# Patient Record
Sex: Female | Born: 1945 | Race: White | Hispanic: No | Marital: Married | Smoking: Current every day smoker
Health system: Southern US, Community
[De-identification: ages and names within clinical notes are randomized; demographics above are authoritative.]

## PROBLEM LIST (undated history)

## (undated) DIAGNOSIS — R569 Unspecified convulsions: Secondary | ICD-10-CM

## (undated) DIAGNOSIS — T7840XA Allergy, unspecified, initial encounter: Secondary | ICD-10-CM

## (undated) DIAGNOSIS — K219 Gastro-esophageal reflux disease without esophagitis: Secondary | ICD-10-CM

## (undated) DIAGNOSIS — J439 Emphysema, unspecified: Secondary | ICD-10-CM

## (undated) DIAGNOSIS — F329 Major depressive disorder, single episode, unspecified: Secondary | ICD-10-CM

## (undated) DIAGNOSIS — F32A Depression, unspecified: Secondary | ICD-10-CM

## (undated) DIAGNOSIS — I2510 Atherosclerotic heart disease of native coronary artery without angina pectoris: Secondary | ICD-10-CM

## (undated) DIAGNOSIS — I639 Cerebral infarction, unspecified: Secondary | ICD-10-CM

## (undated) DIAGNOSIS — J449 Chronic obstructive pulmonary disease, unspecified: Secondary | ICD-10-CM

## (undated) DIAGNOSIS — E785 Hyperlipidemia, unspecified: Secondary | ICD-10-CM

## (undated) DIAGNOSIS — I10 Essential (primary) hypertension: Secondary | ICD-10-CM

## (undated) HISTORY — DX: Gastro-esophageal reflux disease without esophagitis: K21.9

## (undated) HISTORY — DX: Emphysema, unspecified: J43.9

## (undated) HISTORY — DX: Hyperlipidemia, unspecified: E78.5

## (undated) HISTORY — DX: Depression, unspecified: F32.A

## (undated) HISTORY — DX: Allergy, unspecified, initial encounter: T78.40XA

## (undated) HISTORY — DX: Major depressive disorder, single episode, unspecified: F32.9

---

## 2004-10-19 ENCOUNTER — Ambulatory Visit: Payer: Self-pay | Admitting: Internal Medicine

## 2007-06-26 ENCOUNTER — Other Ambulatory Visit: Payer: Self-pay

## 2007-06-26 ENCOUNTER — Emergency Department: Payer: Self-pay | Admitting: Emergency Medicine

## 2007-07-04 ENCOUNTER — Ambulatory Visit: Payer: Self-pay | Admitting: Internal Medicine

## 2007-07-12 ENCOUNTER — Ambulatory Visit: Payer: Self-pay | Admitting: Vascular Surgery

## 2008-11-12 ENCOUNTER — Emergency Department: Payer: Self-pay | Admitting: Unknown Physician Specialty

## 2011-03-21 ENCOUNTER — Emergency Department: Payer: Self-pay | Admitting: Emergency Medicine

## 2012-03-28 ENCOUNTER — Ambulatory Visit: Payer: Self-pay | Admitting: Gastroenterology

## 2012-03-30 LAB — PATHOLOGY REPORT

## 2013-05-14 ENCOUNTER — Inpatient Hospital Stay: Payer: Self-pay | Admitting: Internal Medicine

## 2013-05-14 LAB — URINALYSIS, COMPLETE
Bilirubin,UR: NEGATIVE
Blood: NEGATIVE
Glucose,UR: NEGATIVE mg/dL (ref 0–75)
Hyaline Cast: 6
Leukocyte Esterase: NEGATIVE
Nitrite: NEGATIVE
PH: 5 (ref 4.5–8.0)
Specific Gravity: 1.033 (ref 1.003–1.030)
Squamous Epithelial: <1

## 2013-05-14 LAB — CBC WITH DIFFERENTIAL/PLATELET
Basophil #: 0 10*3/uL (ref 0.0–0.1)
Basophil %: 0.1 %
EOS ABS: 0 10*3/uL (ref 0.0–0.7)
EOS PCT: 0 %
HCT: 35.2 % (ref 35.0–47.0)
HGB: 11.6 g/dL — AB (ref 12.0–16.0)
Lymphocyte #: 1.1 10*3/uL (ref 1.0–3.6)
Lymphocyte %: 5.2 %
MCH: 28.4 pg (ref 26.0–34.0)
MCHC: 33.1 g/dL (ref 32.0–36.0)
MCV: 86 fL (ref 80–100)
MONO ABS: 1.5 x10 3/mm — AB (ref 0.2–0.9)
Monocyte %: 7.4 %
NEUTROS PCT: 87.3 %
Neutrophil #: 17.9 10*3/uL — ABNORMAL HIGH (ref 1.4–6.5)
PLATELETS: 337 10*3/uL (ref 150–440)
RBC: 4.1 10*6/uL (ref 3.80–5.20)
RDW: 16 % — AB (ref 11.5–14.5)
WBC: 20.5 10*3/uL — ABNORMAL HIGH (ref 3.6–11.0)

## 2013-05-14 LAB — PRO B NATRIURETIC PEPTIDE: B-TYPE NATIURETIC PEPTID: 2432 pg/mL — AB (ref 0–125)

## 2013-05-14 LAB — BASIC METABOLIC PANEL
ANION GAP: 7 (ref 7–16)
BUN: 10 mg/dL (ref 7–18)
CALCIUM: 8.6 mg/dL (ref 8.5–10.1)
CHLORIDE: 100 mmol/L (ref 98–107)
CO2: 27 mmol/L (ref 21–32)
Creatinine: 0.82 mg/dL (ref 0.60–1.30)
EGFR (Non-African Amer.): >60
Glucose: 122 mg/dL — ABNORMAL HIGH (ref 65–99)
Osmolality: 269 (ref 275–301)
POTASSIUM: 3.1 mmol/L — AB (ref 3.5–5.1)
Sodium: 134 mmol/L — ABNORMAL LOW (ref 136–145)

## 2013-05-14 LAB — TROPONIN I: Troponin-I: <0.02 ng/mL

## 2013-05-14 LAB — PROTIME-INR
INR: 1.6
Prothrombin Time: 19 secs — ABNORMAL HIGH (ref 11.5–14.7)

## 2013-05-14 LAB — CK TOTAL AND CKMB (NOT AT ARMC)
CK, Total: 78 U/L (ref 21–215)
CK-MB: 0.9 ng/mL (ref 0.5–3.6)

## 2013-05-15 DIAGNOSIS — I517 Cardiomegaly: Secondary | ICD-10-CM

## 2013-05-15 LAB — CBC WITH DIFFERENTIAL/PLATELET
BASOS PCT: 0.1 %
Basophil #: 0 10*3/uL (ref 0.0–0.1)
EOS PCT: 0 %
Eosinophil #: 0 10*3/uL (ref 0.0–0.7)
HCT: 30.2 % — ABNORMAL LOW (ref 35.0–47.0)
HGB: 9.8 g/dL — ABNORMAL LOW (ref 12.0–16.0)
Lymphocyte #: 0.6 10*3/uL — ABNORMAL LOW (ref 1.0–3.6)
Lymphocyte %: 3.1 %
MCH: 27.9 pg (ref 26.0–34.0)
MCHC: 32.4 g/dL (ref 32.0–36.0)
MCV: 86 fL (ref 80–100)
MONOS PCT: 5.2 %
Monocyte #: 1 x10 3/mm — ABNORMAL HIGH (ref 0.2–0.9)
NEUTROS PCT: 91.6 %
Neutrophil #: 18.1 10*3/uL — ABNORMAL HIGH (ref 1.4–6.5)
PLATELETS: 252 10*3/uL (ref 150–440)
RBC: 3.51 10*6/uL — AB (ref 3.80–5.20)
RDW: 16 % — AB (ref 11.5–14.5)
WBC: 19.8 10*3/uL — ABNORMAL HIGH (ref 3.6–11.0)

## 2013-05-15 LAB — BASIC METABOLIC PANEL
Anion Gap: 5 — ABNORMAL LOW (ref 7–16)
BUN: 11 mg/dL (ref 7–18)
CHLORIDE: 103 mmol/L (ref 98–107)
CREATININE: 0.66 mg/dL (ref 0.60–1.30)
Calcium, Total: 8.4 mg/dL — ABNORMAL LOW (ref 8.5–10.1)
Co2: 23 mmol/L (ref 21–32)
EGFR (African American): >60
EGFR (Non-African Amer.): >60
Glucose: 106 mg/dL — ABNORMAL HIGH (ref 65–99)
OSMOLALITY: 262 (ref 275–301)
Potassium: 3.9 mmol/L (ref 3.5–5.1)
Sodium: 131 mmol/L — ABNORMAL LOW (ref 136–145)

## 2013-05-16 LAB — CBC WITH DIFFERENTIAL/PLATELET
BASOS PCT: 0.1 %
Basophil #: 0 10*3/uL (ref 0.0–0.1)
Eosinophil #: 0 10*3/uL (ref 0.0–0.7)
Eosinophil %: 0 %
HCT: 29.2 % — ABNORMAL LOW (ref 35.0–47.0)
HGB: 9.3 g/dL — ABNORMAL LOW (ref 12.0–16.0)
Lymphocyte #: 0.9 10*3/uL — ABNORMAL LOW (ref 1.0–3.6)
Lymphocyte %: 5.4 %
MCH: 27.3 pg (ref 26.0–34.0)
MCHC: 31.9 g/dL — AB (ref 32.0–36.0)
MCV: 85 fL (ref 80–100)
MONOS PCT: 7 %
Monocyte #: 1.2 x10 3/mm — ABNORMAL HIGH (ref 0.2–0.9)
Neutrophil #: 15.3 10*3/uL — ABNORMAL HIGH (ref 1.4–6.5)
Neutrophil %: 87.5 %
Platelet: 333 10*3/uL (ref 150–440)
RBC: 3.41 10*6/uL — ABNORMAL LOW (ref 3.80–5.20)
RDW: 15.8 % — ABNORMAL HIGH (ref 11.5–14.5)
WBC: 17.5 10*3/uL — ABNORMAL HIGH (ref 3.6–11.0)

## 2013-05-16 LAB — PROTIME-INR
INR: 2.5
Prothrombin Time: 26.4 secs — ABNORMAL HIGH (ref 11.5–14.7)

## 2013-05-16 LAB — CLOSTRIDIUM DIFFICILE(ARMC)

## 2013-05-16 LAB — URINE CULTURE

## 2013-05-17 LAB — BASIC METABOLIC PANEL WITH GFR
Anion Gap: 6 — ABNORMAL LOW
BUN: 7 mg/dL
Calcium, Total: 8.2 mg/dL — ABNORMAL LOW
Chloride: 105 mmol/L
Co2: 27 mmol/L
Creatinine: 0.57 mg/dL — ABNORMAL LOW
EGFR (African American): >60
EGFR (Non-African Amer.): >60
Glucose: 81 mg/dL
Osmolality: 273
Potassium: 4.1 mmol/L
Sodium: 138 mmol/L

## 2013-05-17 LAB — PROTIME-INR
INR: 2.2
Prothrombin Time: 23.6 s — ABNORMAL HIGH

## 2013-05-17 LAB — WBCS, STOOL

## 2013-05-18 LAB — CBC WITH DIFFERENTIAL/PLATELET
BASOS ABS: 0 10*3/uL (ref 0.0–0.1)
BASOS PCT: 0.1 %
EOS ABS: 0 10*3/uL (ref 0.0–0.7)
Eosinophil %: 0.2 %
HCT: 28.7 % — AB (ref 35.0–47.0)
HGB: 9.5 g/dL — AB (ref 12.0–16.0)
Lymphocyte #: 1 10*3/uL (ref 1.0–3.6)
Lymphocyte %: 7.1 %
MCH: 27.9 pg (ref 26.0–34.0)
MCHC: 33 g/dL (ref 32.0–36.0)
MCV: 85 fL (ref 80–100)
Monocyte #: 1.5 x10 3/mm — ABNORMAL HIGH (ref 0.2–0.9)
Monocyte %: 10.6 %
NEUTROS PCT: 82 %
Neutrophil #: 11.9 10*3/uL — ABNORMAL HIGH (ref 1.4–6.5)
Platelet: 347 10*3/uL (ref 150–440)
RBC: 3.39 10*6/uL — AB (ref 3.80–5.20)
RDW: 15.6 % — ABNORMAL HIGH (ref 11.5–14.5)
WBC: 14.5 10*3/uL — AB (ref 3.6–11.0)

## 2013-05-18 LAB — STOOL CULTURE

## 2013-05-18 LAB — PROTIME-INR
INR: 2
Prothrombin Time: 22.1 secs — ABNORMAL HIGH (ref 11.5–14.7)

## 2013-05-19 LAB — CBC WITH DIFFERENTIAL/PLATELET
BASOS ABS: 0 10*3/uL (ref 0.0–0.1)
Basophil %: 0.3 %
EOS PCT: 0 %
Eosinophil #: 0 10*3/uL (ref 0.0–0.7)
HCT: 28 % — AB (ref 35.0–47.0)
HGB: 9.1 g/dL — AB (ref 12.0–16.0)
Lymphocyte #: 0.8 10*3/uL — ABNORMAL LOW (ref 1.0–3.6)
Lymphocyte %: 5.4 %
MCH: 27.4 pg (ref 26.0–34.0)
MCHC: 32.5 g/dL (ref 32.0–36.0)
MCV: 84 fL (ref 80–100)
MONO ABS: 1.3 x10 3/mm — AB (ref 0.2–0.9)
Monocyte %: 8.3 %
NEUTROS ABS: 13.4 10*3/uL — AB (ref 1.4–6.5)
NEUTROS PCT: 86 %
Platelet: 339 10*3/uL (ref 150–440)
RBC: 3.32 10*6/uL — AB (ref 3.80–5.20)
RDW: 15.7 % — ABNORMAL HIGH (ref 11.5–14.5)
WBC: 15.6 10*3/uL — AB (ref 3.6–11.0)

## 2013-05-19 LAB — PROTIME-INR
INR: 2.1
Prothrombin Time: 23.4 secs — ABNORMAL HIGH (ref 11.5–14.7)

## 2013-05-19 LAB — CULTURE, BLOOD (SINGLE)

## 2013-07-26 ENCOUNTER — Inpatient Hospital Stay: Payer: Self-pay | Admitting: Internal Medicine

## 2013-07-26 LAB — COMPREHENSIVE METABOLIC PANEL
ALBUMIN: 3.7 g/dL (ref 3.4–5.0)
Alkaline Phosphatase: 74 U/L
Anion Gap: 6 — ABNORMAL LOW (ref 7–16)
BUN: 21 mg/dL — AB (ref 7–18)
Bilirubin,Total: 0.3 mg/dL (ref 0.2–1.0)
CALCIUM: 9 mg/dL (ref 8.5–10.1)
CREATININE: 0.86 mg/dL (ref 0.60–1.30)
Chloride: 106 mmol/L (ref 98–107)
Co2: 25 mmol/L (ref 21–32)
Glucose: 111 mg/dL — ABNORMAL HIGH (ref 65–99)
OSMOLALITY: 277 (ref 275–301)
Potassium: 4.3 mmol/L (ref 3.5–5.1)
SGOT(AST): 17 U/L (ref 15–37)
SGPT (ALT): 15 U/L (ref 12–78)
Sodium: 137 mmol/L (ref 136–145)
Total Protein: 7.8 g/dL (ref 6.4–8.2)

## 2013-07-26 LAB — CBC
HCT: 23.1 % — AB (ref 35.0–47.0)
HGB: 7 g/dL — AB (ref 12.0–16.0)
MCH: 22.5 pg — ABNORMAL LOW (ref 26.0–34.0)
MCHC: 30.3 g/dL — ABNORMAL LOW (ref 32.0–36.0)
MCV: 74 fL — ABNORMAL LOW (ref 80–100)
PLATELETS: 282 10*3/uL (ref 150–440)
RBC: 3.11 10*6/uL — ABNORMAL LOW (ref 3.80–5.20)
RDW: 20.1 % — AB (ref 11.5–14.5)
WBC: 9.4 10*3/uL (ref 3.6–11.0)

## 2013-07-26 LAB — TROPONIN I

## 2013-07-26 LAB — PROTIME-INR
INR: 1.7
Prothrombin Time: 19.8 secs — ABNORMAL HIGH (ref 11.5–14.7)

## 2013-07-27 LAB — CBC WITH DIFFERENTIAL/PLATELET
Basophil #: 0 10*3/uL (ref 0.0–0.1)
Basophil %: 0.4 %
EOS PCT: 0.4 %
Eosinophil #: 0 10*3/uL (ref 0.0–0.7)
HCT: 24.8 % — ABNORMAL LOW (ref 35.0–47.0)
HGB: 7.9 g/dL — ABNORMAL LOW (ref 12.0–16.0)
LYMPHS ABS: 0.5 10*3/uL — AB (ref 1.0–3.6)
Lymphocyte %: 6.1 %
MCH: 24 pg — ABNORMAL LOW (ref 26.0–34.0)
MCHC: 31.7 g/dL — ABNORMAL LOW (ref 32.0–36.0)
MCV: 76 fL — ABNORMAL LOW (ref 80–100)
Monocyte #: 0.5 x10 3/mm (ref 0.2–0.9)
Monocyte %: 5.8 %
Neutrophil #: 7.1 10*3/uL — ABNORMAL HIGH (ref 1.4–6.5)
Neutrophil %: 87.3 %
Platelet: 224 10*3/uL (ref 150–440)
RBC: 3.28 10*6/uL — ABNORMAL LOW (ref 3.80–5.20)
RDW: 19.9 % — AB (ref 11.5–14.5)
WBC: 8.1 10*3/uL (ref 3.6–11.0)

## 2013-07-28 LAB — BASIC METABOLIC PANEL
Anion Gap: 3 — ABNORMAL LOW (ref 7–16)
BUN: 8 mg/dL (ref 7–18)
Calcium, Total: 8.3 mg/dL — ABNORMAL LOW (ref 8.5–10.1)
Chloride: 109 mmol/L — ABNORMAL HIGH (ref 98–107)
Co2: 27 mmol/L (ref 21–32)
Creatinine: 0.7 mg/dL (ref 0.60–1.30)
EGFR (African American): >60
GLUCOSE: 82 mg/dL (ref 65–99)
Osmolality: 275 (ref 275–301)
Potassium: 3.7 mmol/L (ref 3.5–5.1)
SODIUM: 139 mmol/L (ref 136–145)

## 2013-07-28 LAB — CBC WITH DIFFERENTIAL/PLATELET
BASOS ABS: 0 10*3/uL (ref 0.0–0.1)
BASOS PCT: 0.5 %
EOS PCT: 2.8 %
Eosinophil #: 0.1 10*3/uL (ref 0.0–0.7)
HCT: 24.1 % — ABNORMAL LOW (ref 35.0–47.0)
HGB: 7.5 g/dL — AB (ref 12.0–16.0)
Lymphocyte #: 1.2 10*3/uL (ref 1.0–3.6)
Lymphocyte %: 25.3 %
MCH: 23.6 pg — AB (ref 26.0–34.0)
MCHC: 31.3 g/dL — ABNORMAL LOW (ref 32.0–36.0)
MCV: 76 fL — ABNORMAL LOW (ref 80–100)
Monocyte #: 0.6 x10 3/mm (ref 0.2–0.9)
Monocyte %: 13 %
NEUTROS ABS: 2.7 10*3/uL (ref 1.4–6.5)
Neutrophil %: 58.4 %
PLATELETS: 241 10*3/uL (ref 150–440)
RBC: 3.19 10*6/uL — ABNORMAL LOW (ref 3.80–5.20)
RDW: 20.2 % — ABNORMAL HIGH (ref 11.5–14.5)
WBC: 4.7 10*3/uL (ref 3.6–11.0)

## 2013-07-29 LAB — CBC WITH DIFFERENTIAL/PLATELET
Basophil #: 0 10*3/uL (ref 0.0–0.1)
Basophil %: 0.5 %
Eosinophil #: 0.2 10*3/uL (ref 0.0–0.7)
Eosinophil %: 3.6 %
HCT: 28.7 % — AB (ref 35.0–47.0)
HGB: 9.3 g/dL — ABNORMAL LOW (ref 12.0–16.0)
LYMPHS ABS: 1.4 10*3/uL (ref 1.0–3.6)
Lymphocyte %: 26.8 %
MCH: 24.8 pg — ABNORMAL LOW (ref 26.0–34.0)
MCHC: 32.4 g/dL (ref 32.0–36.0)
MCV: 77 fL — ABNORMAL LOW (ref 80–100)
Monocyte #: 0.6 x10 3/mm (ref 0.2–0.9)
Monocyte %: 11.5 %
NEUTROS ABS: 3 10*3/uL (ref 1.4–6.5)
Neutrophil %: 57.6 %
Platelet: 241 10*3/uL (ref 150–440)
RBC: 3.76 10*6/uL — AB (ref 3.80–5.20)
RDW: 20.6 % — AB (ref 11.5–14.5)
WBC: 5.2 10*3/uL (ref 3.6–11.0)

## 2013-07-29 LAB — PROTIME-INR
INR: 1.3
Prothrombin Time: 16.2 secs — ABNORMAL HIGH (ref 11.5–14.7)

## 2013-07-31 LAB — PATHOLOGY REPORT

## 2013-09-08 ENCOUNTER — Inpatient Hospital Stay: Payer: Self-pay | Admitting: Internal Medicine

## 2013-09-08 LAB — COMPREHENSIVE METABOLIC PANEL
Albumin: 3.6 g/dL (ref 3.4–5.0)
Alkaline Phosphatase: 68 U/L
Anion Gap: 7 (ref 7–16)
BUN: 13 mg/dL (ref 7–18)
Bilirubin,Total: 0.2 mg/dL (ref 0.2–1.0)
CO2: 28 mmol/L (ref 21–32)
Calcium, Total: 9 mg/dL (ref 8.5–10.1)
Chloride: 105 mmol/L (ref 98–107)
Creatinine: 0.66 mg/dL (ref 0.60–1.30)
Glucose: 93 mg/dL (ref 65–99)
Osmolality: 279 (ref 275–301)
POTASSIUM: 3.5 mmol/L (ref 3.5–5.1)
SGOT(AST): 28 U/L (ref 15–37)
SGPT (ALT): 20 U/L (ref 12–78)
Sodium: 140 mmol/L (ref 136–145)
TOTAL PROTEIN: 7.3 g/dL (ref 6.4–8.2)

## 2013-09-08 LAB — CBC WITH DIFFERENTIAL/PLATELET
Basophil #: 0 10*3/uL (ref 0.0–0.1)
Basophil %: 0.5 %
Eosinophil #: 0.1 10*3/uL (ref 0.0–0.7)
Eosinophil %: 0.9 %
HCT: 40.7 % (ref 35.0–47.0)
HGB: 13.2 g/dL (ref 12.0–16.0)
Lymphocyte #: 0.8 10*3/uL — ABNORMAL LOW (ref 1.0–3.6)
Lymphocyte %: 8.6 %
MCH: 28.9 pg (ref 26.0–34.0)
MCHC: 32.6 g/dL (ref 32.0–36.0)
MCV: 89 fL (ref 80–100)
Monocyte #: 0.5 x10 3/mm (ref 0.2–0.9)
Monocyte %: 6 %
NEUTROS ABS: 7.4 10*3/uL — AB (ref 1.4–6.5)
Neutrophil %: 84 %
Platelet: 179 10*3/uL (ref 150–440)
RBC: 4.58 10*6/uL (ref 3.80–5.20)
RDW: 25.9 % — ABNORMAL HIGH (ref 11.5–14.5)
WBC: 8.8 10*3/uL (ref 3.6–11.0)

## 2013-09-08 LAB — URINALYSIS, COMPLETE
Bilirubin,UR: NEGATIVE
Glucose,UR: NEGATIVE mg/dL (ref 0–75)
Ketone: NEGATIVE
Nitrite: NEGATIVE
Ph: 6 (ref 4.5–8.0)
Protein: 100
RBC,UR: 41 /HPF (ref 0–5)
Specific Gravity: 1.015 (ref 1.003–1.030)
Squamous Epithelial: 6
WBC UR: 21 /HPF (ref 0–5)

## 2013-09-08 LAB — PROTIME-INR
INR: 2.7
Prothrombin Time: 27.8 secs — ABNORMAL HIGH (ref 11.5–14.7)

## 2013-09-08 LAB — TROPONIN I: Troponin-I: <0.02 ng/mL

## 2013-09-09 ENCOUNTER — Ambulatory Visit: Payer: Self-pay | Admitting: Neurology

## 2013-09-09 LAB — BASIC METABOLIC PANEL
Anion Gap: 18 — ABNORMAL HIGH (ref 7–16)
Anion Gap: 5 — ABNORMAL LOW (ref 7–16)
BUN: 14 mg/dL (ref 7–18)
BUN: 13 mg/dL (ref 7–18)
CALCIUM: 9.1 mg/dL (ref 8.5–10.1)
CHLORIDE: 104 mmol/L (ref 98–107)
CREATININE: 0.85 mg/dL (ref 0.60–1.30)
CREATININE: 0.64 mg/dL (ref 0.60–1.30)
Calcium, Total: 9.5 mg/dL (ref 8.5–10.1)
Chloride: 103 mmol/L (ref 98–107)
Co2: 16 mmol/L — ABNORMAL LOW (ref 21–32)
Co2: 29 mmol/L (ref 21–32)
EGFR (Non-African Amer.): >60
EGFR (Non-African Amer.): >60
Glucose: 157 mg/dL — ABNORMAL HIGH (ref 65–99)
Glucose: 109 mg/dL — ABNORMAL HIGH (ref 65–99)
OSMOLALITY: 275 (ref 275–301)
Osmolality: 279 (ref 275–301)
POTASSIUM: 3.3 mmol/L — AB (ref 3.5–5.1)
POTASSIUM: 4.3 mmol/L (ref 3.5–5.1)
Sodium: 138 mmol/L (ref 136–145)
Sodium: 137 mmol/L (ref 136–145)

## 2013-09-09 LAB — CBC WITH DIFFERENTIAL/PLATELET
BASOS PCT: 0.2 %
Basophil #: 0 10*3/uL (ref 0.0–0.1)
Eosinophil #: 0 10*3/uL (ref 0.0–0.7)
Eosinophil %: 0.2 %
HCT: 40.1 % (ref 35.0–47.0)
HGB: 12.7 g/dL (ref 12.0–16.0)
LYMPHS ABS: 0.8 10*3/uL — AB (ref 1.0–3.6)
LYMPHS PCT: 6.2 %
MCH: 28.2 pg (ref 26.0–34.0)
MCHC: 31.7 g/dL — ABNORMAL LOW (ref 32.0–36.0)
MCV: 89 fL (ref 80–100)
MONOS PCT: 5 %
Monocyte #: 0.6 x10 3/mm (ref 0.2–0.9)
NEUTROS PCT: 88.4 %
Neutrophil #: 11.4 10*3/uL — ABNORMAL HIGH (ref 1.4–6.5)
Platelet: 199 10*3/uL (ref 150–440)
RBC: 4.5 10*6/uL (ref 3.80–5.20)
RDW: 25.8 % — ABNORMAL HIGH (ref 11.5–14.5)
WBC: 12.9 10*3/uL — ABNORMAL HIGH (ref 3.6–11.0)

## 2013-09-09 LAB — URINE CULTURE

## 2013-09-09 LAB — PROTIME-INR
INR: 2.4
Prothrombin Time: 25.8 secs — ABNORMAL HIGH (ref 11.5–14.7)

## 2013-09-09 LAB — MAGNESIUM
MAGNESIUM: 1.6 mg/dL — AB
Magnesium: 3.4 mg/dL — ABNORMAL HIGH

## 2013-09-10 LAB — BASIC METABOLIC PANEL
ANION GAP: 5 — AB (ref 7–16)
BUN: 12 mg/dL (ref 7–18)
CALCIUM: 8.9 mg/dL (ref 8.5–10.1)
Chloride: 104 mmol/L (ref 98–107)
Co2: 29 mmol/L (ref 21–32)
Creatinine: 0.81 mg/dL (ref 0.60–1.30)
EGFR (African American): >60
Glucose: 82 mg/dL (ref 65–99)
Osmolality: 275 (ref 275–301)
Potassium: 3.6 mmol/L (ref 3.5–5.1)
Sodium: 138 mmol/L (ref 136–145)

## 2013-09-10 LAB — CBC WITH DIFFERENTIAL/PLATELET
Basophil #: 0 10*3/uL (ref 0.0–0.1)
Basophil %: 0.4 %
EOS ABS: 0.1 10*3/uL (ref 0.0–0.7)
Eosinophil %: 1.6 %
HCT: 37.8 % (ref 35.0–47.0)
HGB: 12.3 g/dL (ref 12.0–16.0)
LYMPHS ABS: 1.3 10*3/uL (ref 1.0–3.6)
Lymphocyte %: 15.1 %
MCH: 28.9 pg (ref 26.0–34.0)
MCHC: 32.5 g/dL (ref 32.0–36.0)
MCV: 89 fL (ref 80–100)
MONO ABS: 0.8 x10 3/mm (ref 0.2–0.9)
MONOS PCT: 9.6 %
NEUTROS PCT: 73.3 %
Neutrophil #: 6.1 10*3/uL (ref 1.4–6.5)
PLATELETS: 167 10*3/uL (ref 150–440)
RBC: 4.24 10*6/uL (ref 3.80–5.20)
RDW: 25.6 % — AB (ref 11.5–14.5)
WBC: 8.4 10*3/uL (ref 3.6–11.0)

## 2013-09-10 LAB — PROTIME-INR
INR: 3.5
PROTHROMBIN TIME: 33.7 s — AB (ref 11.5–14.7)

## 2013-09-10 LAB — MAGNESIUM: Magnesium: 2.4 mg/dL

## 2013-09-11 LAB — BASIC METABOLIC PANEL
ANION GAP: 9 (ref 7–16)
BUN: 9 mg/dL (ref 7–18)
CALCIUM: 9.4 mg/dL (ref 8.5–10.1)
CREATININE: 0.54 mg/dL — AB (ref 0.60–1.30)
Chloride: 98 mmol/L (ref 98–107)
Co2: 28 mmol/L (ref 21–32)
EGFR (African American): >60
Glucose: 106 mg/dL — ABNORMAL HIGH (ref 65–99)
OSMOLALITY: 269 (ref 275–301)
Potassium: 3.3 mmol/L — ABNORMAL LOW (ref 3.5–5.1)
Sodium: 135 mmol/L — ABNORMAL LOW (ref 136–145)

## 2013-09-11 LAB — CBC WITH DIFFERENTIAL/PLATELET
BASOS PCT: 0.2 %
Basophil #: 0 10*3/uL (ref 0.0–0.1)
Eosinophil #: 0 10*3/uL (ref 0.0–0.7)
Eosinophil %: 0.3 %
HCT: 38.6 % (ref 35.0–47.0)
HGB: 12.6 g/dL (ref 12.0–16.0)
LYMPHS ABS: 0.9 10*3/uL — AB (ref 1.0–3.6)
Lymphocyte %: 7.3 %
MCH: 28.9 pg (ref 26.0–34.0)
MCHC: 32.7 g/dL (ref 32.0–36.0)
MCV: 89 fL (ref 80–100)
MONO ABS: 1.1 x10 3/mm — AB (ref 0.2–0.9)
Monocyte %: 9.1 %
NEUTROS PCT: 83.1 %
Neutrophil #: 9.9 10*3/uL — ABNORMAL HIGH (ref 1.4–6.5)
Platelet: 164 10*3/uL (ref 150–440)
RBC: 4.36 10*6/uL (ref 3.80–5.20)
RDW: 25 % — ABNORMAL HIGH (ref 11.5–14.5)
WBC: 11.9 10*3/uL — ABNORMAL HIGH (ref 3.6–11.0)

## 2013-09-11 LAB — URINE CULTURE

## 2013-09-11 LAB — PROTIME-INR
INR: 4.6
Prothrombin Time: 42.2 secs — ABNORMAL HIGH (ref 11.5–14.7)

## 2013-09-11 LAB — MAGNESIUM: Magnesium: 1.5 mg/dL — ABNORMAL LOW

## 2013-09-12 LAB — BASIC METABOLIC PANEL
Anion Gap: 6 — ABNORMAL LOW (ref 7–16)
BUN: 13 mg/dL (ref 7–18)
CO2: 29 mmol/L (ref 21–32)
Calcium, Total: 9.1 mg/dL (ref 8.5–10.1)
Chloride: 104 mmol/L (ref 98–107)
Creatinine: 0.71 mg/dL (ref 0.60–1.30)
EGFR (African American): >60
EGFR (Non-African Amer.): >60
Glucose: 87 mg/dL (ref 65–99)
Osmolality: 277 (ref 275–301)
Potassium: 3.9 mmol/L (ref 3.5–5.1)
Sodium: 139 mmol/L (ref 136–145)

## 2013-09-12 LAB — MAGNESIUM: Magnesium: 2 mg/dL

## 2013-09-12 LAB — PROTIME-INR
INR: 2.8
Prothrombin Time: 28.9 secs — ABNORMAL HIGH (ref 11.5–14.7)

## 2013-09-13 LAB — CULTURE, BLOOD (SINGLE)

## 2013-09-13 LAB — PROTIME-INR
INR: 1.7
Prothrombin Time: 19.8 secs — ABNORMAL HIGH (ref 11.5–14.7)

## 2013-11-22 ENCOUNTER — Inpatient Hospital Stay: Payer: Self-pay | Admitting: Internal Medicine

## 2013-11-22 LAB — COMPREHENSIVE METABOLIC PANEL
ALBUMIN: 3.4 g/dL (ref 3.4–5.0)
ALK PHOS: 82 U/L
ALT: 16 U/L
ANION GAP: 7 (ref 7–16)
AST: 28 U/L (ref 15–37)
BUN: 12 mg/dL (ref 7–18)
Bilirubin,Total: 0.3 mg/dL (ref 0.2–1.0)
CHLORIDE: 106 mmol/L (ref 98–107)
CREATININE: 0.76 mg/dL (ref 0.60–1.30)
Calcium, Total: 9 mg/dL (ref 8.5–10.1)
Co2: 27 mmol/L (ref 21–32)
EGFR (African American): >60
EGFR (Non-African Amer.): >60
GLUCOSE: 83 mg/dL (ref 65–99)
Osmolality: 278 (ref 275–301)
Potassium: 4.3 mmol/L (ref 3.5–5.1)
SODIUM: 140 mmol/L (ref 136–145)
TOTAL PROTEIN: 7.3 g/dL (ref 6.4–8.2)

## 2013-11-22 LAB — CBC
HCT: 41.6 % (ref 35.0–47.0)
HGB: 13.4 g/dL (ref 12.0–16.0)
MCH: 30.5 pg (ref 26.0–34.0)
MCHC: 32.3 g/dL (ref 32.0–36.0)
MCV: 94 fL (ref 80–100)
Platelet: 164 10*3/uL (ref 150–440)
RBC: 4.41 10*6/uL (ref 3.80–5.20)
RDW: 13.9 % (ref 11.5–14.5)
WBC: 9.7 10*3/uL (ref 3.6–11.0)

## 2013-11-22 LAB — URINALYSIS, COMPLETE
Bacteria: NONE SEEN
Bilirubin,UR: NEGATIVE
Blood: NEGATIVE
Glucose,UR: NEGATIVE mg/dL (ref 0–75)
Ketone: NEGATIVE
LEUKOCYTE ESTERASE: NEGATIVE
NITRITE: NEGATIVE
PH: 5 (ref 4.5–8.0)
Protein: NEGATIVE
RBC,UR: 1 /HPF (ref 0–5)
SQUAMOUS EPITHELIAL: NONE SEEN
Specific Gravity: 1.018 (ref 1.003–1.030)

## 2013-11-22 LAB — PROTIME-INR
INR: 1.3
PROTHROMBIN TIME: 15.9 s — AB (ref 11.5–14.7)

## 2013-11-23 LAB — CBC WITH DIFFERENTIAL/PLATELET
BASOS ABS: 0 10*3/uL (ref 0.0–0.1)
Basophil %: 0.5 %
Eosinophil #: 0.2 10*3/uL (ref 0.0–0.7)
Eosinophil %: 1.7 %
HCT: 42.8 % (ref 35.0–47.0)
HGB: 14.3 g/dL (ref 12.0–16.0)
LYMPHS PCT: 18.7 %
Lymphocyte #: 1.8 10*3/uL (ref 1.0–3.6)
MCH: 30.8 pg (ref 26.0–34.0)
MCHC: 33.3 g/dL (ref 32.0–36.0)
MCV: 93 fL (ref 80–100)
Monocyte #: 0.7 x10 3/mm (ref 0.2–0.9)
Monocyte %: 7 %
Neutrophil #: 7.1 10*3/uL — ABNORMAL HIGH (ref 1.4–6.5)
Neutrophil %: 72.1 %
Platelet: 172 10*3/uL (ref 150–440)
RBC: 4.62 10*6/uL (ref 3.80–5.20)
RDW: 13.8 % (ref 11.5–14.5)
WBC: 9.9 10*3/uL (ref 3.6–11.0)

## 2013-11-23 LAB — PROTIME-INR
INR: 1.4
Prothrombin Time: 16.8 secs — ABNORMAL HIGH (ref 11.5–14.7)

## 2013-11-23 LAB — BASIC METABOLIC PANEL
Anion Gap: 8 (ref 7–16)
BUN: 16 mg/dL (ref 7–18)
CO2: 24 mmol/L (ref 21–32)
CREATININE: 0.72 mg/dL (ref 0.60–1.30)
Calcium, Total: 9 mg/dL (ref 8.5–10.1)
Chloride: 104 mmol/L (ref 98–107)
EGFR (African American): >60
EGFR (Non-African Amer.): >60
GLUCOSE: 91 mg/dL (ref 65–99)
OSMOLALITY: 273 (ref 275–301)
POTASSIUM: 4.2 mmol/L (ref 3.5–5.1)
Sodium: 136 mmol/L (ref 136–145)

## 2013-11-23 LAB — LIPID PANEL
CHOLESTEROL: 193 mg/dL (ref 0–200)
HDL: 69 mg/dL — AB (ref 40–60)
Ldl Cholesterol, Calc: 94 mg/dL (ref 0–100)
Triglycerides: 150 mg/dL (ref 0–200)
VLDL CHOLESTEROL, CALC: 30 mg/dL (ref 5–40)

## 2013-11-23 LAB — TSH: THYROID STIMULATING HORM: 5.84 u[IU]/mL — AB

## 2013-11-23 LAB — HEMOGLOBIN A1C: Hemoglobin A1C: 5.5 % (ref 4.2–6.3)

## 2013-11-24 LAB — PROTIME-INR
INR: 1.5
Prothrombin Time: 18.1 secs — ABNORMAL HIGH (ref 11.5–14.7)

## 2013-11-25 LAB — BASIC METABOLIC PANEL
Anion Gap: 6 — ABNORMAL LOW (ref 7–16)
BUN: 21 mg/dL — ABNORMAL HIGH (ref 7–18)
CHLORIDE: 104 mmol/L (ref 98–107)
CREATININE: 0.76 mg/dL (ref 0.60–1.30)
Calcium, Total: 9.3 mg/dL (ref 8.5–10.1)
Co2: 27 mmol/L (ref 21–32)
EGFR (African American): >60
Glucose: 87 mg/dL (ref 65–99)
Osmolality: 276 (ref 275–301)
Potassium: 4.5 mmol/L (ref 3.5–5.1)
Sodium: 137 mmol/L (ref 136–145)

## 2013-11-25 LAB — CBC WITH DIFFERENTIAL/PLATELET
Basophil #: 0 10*3/uL (ref 0.0–0.1)
Basophil %: 0.6 %
Eosinophil #: 0.2 10*3/uL (ref 0.0–0.7)
Eosinophil %: 2.4 %
HCT: 41.6 % (ref 35.0–47.0)
HGB: 13.5 g/dL (ref 12.0–16.0)
LYMPHS ABS: 1.9 10*3/uL (ref 1.0–3.6)
Lymphocyte %: 25.4 %
MCH: 30.6 pg (ref 26.0–34.0)
MCHC: 32.4 g/dL (ref 32.0–36.0)
MCV: 94 fL (ref 80–100)
Monocyte #: 0.7 x10 3/mm (ref 0.2–0.9)
Monocyte %: 9.4 %
Neutrophil #: 4.6 10*3/uL (ref 1.4–6.5)
Neutrophil %: 62.2 %
Platelet: 159 10*3/uL (ref 150–440)
RBC: 4.4 10*6/uL (ref 3.80–5.20)
RDW: 13.4 % (ref 11.5–14.5)
WBC: 7.4 10*3/uL (ref 3.6–11.0)

## 2013-11-25 LAB — PROTIME-INR
INR: 1.6
PROTHROMBIN TIME: 18.9 s — AB (ref 11.5–14.7)

## 2013-12-10 DIAGNOSIS — R531 Weakness: Secondary | ICD-10-CM | POA: Insufficient documentation

## 2013-12-10 DIAGNOSIS — Z720 Tobacco use: Secondary | ICD-10-CM | POA: Insufficient documentation

## 2013-12-10 DIAGNOSIS — H5347 Heteronymous bilateral field defects: Secondary | ICD-10-CM | POA: Insufficient documentation

## 2013-12-10 DIAGNOSIS — I6930 Unspecified sequelae of cerebral infarction: Secondary | ICD-10-CM | POA: Insufficient documentation

## 2014-03-27 ENCOUNTER — Inpatient Hospital Stay: Payer: Self-pay | Admitting: Internal Medicine

## 2014-03-27 LAB — URINALYSIS, COMPLETE
Bilirubin,UR: NEGATIVE
Glucose,UR: NEGATIVE mg/dL (ref 0–75)
Hyaline Cast: 5
Ketone: NEGATIVE
Nitrite: POSITIVE
Ph: 5 (ref 4.5–8.0)
Protein: NEGATIVE
SPECIFIC GRAVITY: 1.028 (ref 1.003–1.030)
Squamous Epithelial: 3
WBC UR: 42 /HPF (ref 0–5)

## 2014-03-27 LAB — COMPREHENSIVE METABOLIC PANEL
ALBUMIN: 3.4 g/dL (ref 3.4–5.0)
ALT: 15 U/L
Alkaline Phosphatase: 78 U/L
Anion Gap: 5 — ABNORMAL LOW (ref 7–16)
BUN: 26 mg/dL — AB (ref 7–18)
Bilirubin,Total: 0.3 mg/dL (ref 0.2–1.0)
CALCIUM: 8.8 mg/dL (ref 8.5–10.1)
CHLORIDE: 110 mmol/L — AB (ref 98–107)
Co2: 26 mmol/L (ref 21–32)
Creatinine: 0.86 mg/dL (ref 0.60–1.30)
EGFR (African American): >60
EGFR (Non-African Amer.): >60
Glucose: 107 mg/dL — ABNORMAL HIGH (ref 65–99)
Osmolality: 286 (ref 275–301)
Potassium: 4.1 mmol/L (ref 3.5–5.1)
SGOT(AST): 29 U/L (ref 15–37)
SODIUM: 141 mmol/L (ref 136–145)
TOTAL PROTEIN: 6.9 g/dL (ref 6.4–8.2)

## 2014-03-27 LAB — CBC
HCT: 37 % (ref 35.0–47.0)
HGB: 12.1 g/dL (ref 12.0–16.0)
MCH: 31.7 pg (ref 26.0–34.0)
MCHC: 32.7 g/dL (ref 32.0–36.0)
MCV: 97 fL (ref 80–100)
Platelet: 175 10*3/uL (ref 150–440)
RBC: 3.83 10*6/uL (ref 3.80–5.20)
RDW: 13.3 % (ref 11.5–14.5)
WBC: 6.9 10*3/uL (ref 3.6–11.0)

## 2014-03-27 LAB — TROPONIN I: Troponin-I: <0.02 ng/mL

## 2014-03-27 LAB — PROTIME-INR
INR: 1.5
Prothrombin Time: 17.5 secs — ABNORMAL HIGH (ref 11.5–14.7)

## 2014-03-28 LAB — BASIC METABOLIC PANEL
Anion Gap: 6 — ABNORMAL LOW (ref 7–16)
BUN: 19 mg/dL — ABNORMAL HIGH (ref 7–18)
CALCIUM: 8.7 mg/dL (ref 8.5–10.1)
CO2: 24 mmol/L (ref 21–32)
CREATININE: 0.71 mg/dL (ref 0.60–1.30)
Chloride: 110 mmol/L — ABNORMAL HIGH (ref 98–107)
Glucose: 86 mg/dL (ref 65–99)
Osmolality: 281 (ref 275–301)
POTASSIUM: 4 mmol/L (ref 3.5–5.1)
Sodium: 140 mmol/L (ref 136–145)

## 2014-03-28 LAB — PROTIME-INR
INR: 1.7
Prothrombin Time: 19.3 secs — ABNORMAL HIGH (ref 11.5–14.7)

## 2014-03-29 LAB — PROTIME-INR
INR: 1.7
PROTHROMBIN TIME: 19.2 s — AB (ref 11.5–14.7)

## 2014-03-29 LAB — URINE CULTURE

## 2014-03-30 LAB — PROTIME-INR
INR: 2
PROTHROMBIN TIME: 21.8 s — AB (ref 11.5–14.7)

## 2014-07-01 ENCOUNTER — Inpatient Hospital Stay: Payer: Self-pay | Admitting: Internal Medicine

## 2014-08-23 NOTE — Discharge Summary (Signed)
PATIENT NAME:  Michele Matthews, Michele Matthews MR#:  [id number] DATE OF BIRTH:  [birth year]  DATE OF ADMISSION:  05/14/2013  DATE OF DISCHARGE:  05/19/2013  ADMITTING DIAGNOSES:  1.  Systemic inflammatory response syndrome due to pneumonitis and bronchitis.  2.  Chronic obstructive pulmonary disease.   DISCHARGE DIAGNOSES: 1.  SIRS.  2.  COPD exacerbation.  3.  Pneumonia.  4.  Viral gastroenteritis, with residual diarrhea. 5.  Hypertension. 6.  Hyperlipidemia. 7.  History of embolic stroke with left-sided weakness.  8.  Questionable right internal carotid artery stenosis, now on Coumadin therapy.  9.  History of COPD, not on oxygen therapy at home.  10.  Ongoing tobacco abuse.   DISCHARGE CONDITION: Stable.   DISCHARGE MEDICATIONS:  1.  The patient is to continue warfarin 5 mg p.o. daily. 2.  Lorazepam 1 mg every 6 hours. 3.  Sertraline 200 mg p.o. daily. 4.  Atenolol 50 mg p.o. twice daily. 5.  Atorvastatin 10 mg p.o. at bedtime. 6.  Lisinopril 10 mg p.o. daily. 7.  Nexium 40 mg p.o. daily. 8.  Prednisone taper 50 mg p.o. once on 05/20/2013, then taper by 10 mg daily until stopped.  9.  Loperamide 2 mg 4 times daily as needed.  10.  Benadryl 25 mg at bedtime as needed.  11.  Albuterol/ipratropium 2.5/0.5 mg in 3 mL inhalation solution, 1 inhalation 6 times daily as needed.  12.  Fluticasone/salmeterol 250/50, 1 puff twice daily.  13.  Tiotropium 1 capsule once daily.  14.  Nicotine 2 mg every hour as needed for nicotine cravings.  15.  Ensure 240 mg 3 times daily with meals.  16.  Levaquin 500 mg p.o. daily for 7 days.   INSTRUCTIONS:  Home oxygen with portable tank, 2 liters of oxygen per nasal cannula. Diet: 2 grams salt, mechanical soft, aspiration precautions. Activity limitations as tolerated. Referral to Physical Therapy as well as Speech Therapy. Followup appointment with Dr. Hall Busing in 2 days after discharge.   CONSULTANTS: Speech Therapist, Care Management, Social Work.   RADIOLOGIC  STUDIES: Chest x-ray PA and lateral on 05/14/2013 revealed no segmental infiltrate or pulmonary edema, mild reticular increased interstitial markings suspicious for interstitial pneumonitis were noted. Clinical correlation was recommended, and small hiatal hernia was also noted. Repeated chest x-ray, portable single view, 05/17/2013, revealed diffuse interstitial opacification on the left suggesting pneumonitis, possibly atypical pneumonia. On the right, there is prominence of right hilum with a band of heavy opacity extending into the right upper lobe, which may represent pneumonia with hilar adenopathy. The possibility of right hilar mass was not excluded. Would suggest follow up to insure complete resolution. Failure to resolve promptly with treatment would suggest the need for contrast-enhanced CT of thorax. The patient underwent CT of chest with IV contrast 05/17/2013, which showed CT findings which were most consistent with multifocal infectious inflammatory process involving all lobes of both lungs, with relative sparing of right middle lobe. Differential considerations include multifocal bacterial pneumonia, atypical or viral pneumonia, and acute pneumonitis. Prominent bilateral hilar node tissue was favored to be reactive due to underlying process. Recommend followup CT scan following the appropriate course of antibiotic therapy in 4 to 8 weeks, according to radiologist. Interval development of linear atelectasis in the posterior aspect of right lower lobe as compared to 05/14/2013. Differential considerations include mucus plugging, small volume aspiration, and possibly poor inspiratory volumes, small bilateral pleural effusions, arteriosclerosis including multivessel coronary artery disease, and high grade left subclavian artery stenosis, background  of mild central lobular emphysema. Chronic appearing L2 compression fracture, large sliding hiatal hernia.   REASON FOR ADMISSION:  The patient is a  69 year old Caucasian female with history of tobacco abuse as well as COPD, who presents to the hospital on 05/14/2013 with complaints of cough, weakness, nausea as well as vomiting, and diarrhea. Please refer to Dr. Wyatt Portela admission note on 05/14/2013. On arrival to the hospital, the patient's temperature was 97.6, pulse was 93, respirations were 22, blood pressure 133/69, saturation was 92% on room air. Physical exam revealed scant breath sounds, minimal exertion caused increased work of breathing, but no crackles, a few coarse rhonchi were noted in posterior area. Otherwise, physical exam was unremarkable.   LAB DATA:  Done on admission, 05/14/2013, revealed mild elevation of beta-type natriuretic peptide of 2432, glucose 122, sodium 134, potassium 3.1, otherwise BMP was unremarkable. Cardiac enzymes first set was negative. CBC: White blood cell count was elevated to 20.5, hemoglobin was 11.6, platelet count was 337, with absolute neutrophil count of 17.9. The patient's coagulation panel revealed pro time of 19.0, INR was 1.6. The patient's blood cultures taken on the same day, 05/14/2013, showed no growth. Urine culture also showed no growth. Stool cultures revealed no pathogenic bacteria, and C. diff was negative. Urinalysis was unremarkable. The patient's EKG showed normal sinus rhythm at 87 beats per minute, nonspecific ST-T changes, and prolonged QTc to 471 milliseconds, but otherwise no other abnormalities were found.   HOSPITAL COURSE:  The patient was admitted to the hospital for further evaluation. She was started on antibiotic therapy with Rocephin as well as azithromycin, and was rehydrated for her nausea, vomiting, and diarrhea. With this, her condition somewhat improved. With antibiotic therapy, patient's white blood cell count improved. However, since was still somewhat hypoxic, we repeated a chest x-ray, which was concerning for possible worsening of her pneumonia. The patient underwent CT  scanning of her chest, which revealed multifocal pneumonia concerning for possible aspiration. Speech therapist was involved for evaluation, and recommended to change her diet to mechanical soft, easy to chew 2 gram diet. They also recommended to add extra gravy for chopped meats. With this, the patient's condition improved as well as her oxygenation,. Oxygenation mostly improved when she was started on high dose of steroids signifying COPD exacerbation due to pneumonitis. It was felt that patient would benefit from a course of Levaquin therapy, a 7-day course was prescribed for her upon discharge. She is also to continue a steroid taper as well as oxygen therapy and taper. She is to continue inhalation therapy with Advair, Tiotropium, as well as DuoNeb. In regards to her gastroenteritis, since her cultures were negative, it was felt to be viral gastroenteritis. The patient still intermittently has diarrheal stool, for which she is to take loperamide as needed. Her oral intake has improved, and she has been eating approximately 60% of offered meals. She also is drinking some Ensure. For history of hypertension, hyperlipidemia, history of stroke, she is to continue Coumadin therapy as well as her usual medications. For tobacco abuse, she was counseled, and nicotine replacement therapy was initiated.   She is being discharged in stable condition with the above-mentioned medications and followup. Her vital signs on day of discharge:  Temperature was 97.4, pulse was 79 to 87, respiratory rate was 20, blood pressure 117/60, saturation was 92% on 2 liters of oxygen per nasal cannula at rest.   TIME SPENT:  40 minutes.   ____________________________ Theodoro Grist, MD rv:mr D: 05/19/2013  15:32:53 ET T: 05/19/2013 16:03:20 ET JOB#: [id number]  cc: Theodoro Grist, MD, <Dictator> Leona Carry. Hall Busing, MD  Theodoro Grist MD ELECTRONICALLY SIGNED 05/24/2013 14:34

## 2014-08-23 NOTE — Consult Note (Signed)
Chief Complaint:  Subjective/Chief Complaint seen for anemia and heme positive stool.  stable overnight, no evidnece of overt bleeding. no n or abdominal pain.  bm  times 2 yesterday no melena.   VITAL SIGNS/ANCILLARY NOTES: **Vital Signs.:   30-Mar-15 05:57  Vital Signs Type Routine  Temperature Temperature (F) 97.7  Temperature Source oral  Pulse Pulse 96  Respirations Respirations 18  Systolic BP Systolic BP 592  Diastolic BP (mmHg) Diastolic BP (mmHg) 72  Mean BP 99  Pulse Ox % Pulse Ox % 95  Pulse Ox Activity Level  At rest  Oxygen Delivery Room Air/ 21 %   Brief Assessment:  Cardiac Regular   Respiratory clear BS   Gastrointestinal details normal Soft  Nontender  Nondistended  No masses palpable   Lab Results: Routine Coag:  30-Mar-15 05:02   Prothrombin  16.2  INR 1.3 (INR reference interval applies to patients on anticoagulant therapy. A single INR therapeutic range for coumarins is not optimal for all indications; however, the suggested range for most indications is 2.0 - 3.0. Exceptions to the INR Reference Range may include: Prosthetic heart valves, acute myocardial infarction, prevention of myocardial infarction, and combinations of aspirin and anticoagulant. The need for a higher or lower target INR must be assessed individually. Reference: The Pharmacology and Management of the Vitamin K  antagonists: the seventh ACCP Conference on Antithrombotic and Thrombolytic Therapy. TWKMQ.2863 Sept:126 (3suppl): [id number]. A HCT value >55% may artifactually increase the PT.  In one study,  the increase was an average of 25%. Reference:  "Effect on Routine and Special Coagulation Testing Values of Citrate Anticoagulant Adjustment in Patients with High HCT Values." American Journal of Clinical Pathology 2006;126:400-405.)  Routine Hem:  30-Mar-15 05:02   WBC (CBC) 5.2  RBC (CBC)  3.76  Hemoglobin (CBC)  9.3  Hematocrit (CBC)  28.7  Platelet Count (CBC) 241  MCV   77  MCH  24.8  MCHC 32.4  RDW  20.6  Neutrophil % 57.6  Lymphocyte % 26.8  Monocyte % 11.5  Eosinophil % 3.6  Basophil % 0.5  Neutrophil # 3.0  Lymphocyte # 1.4  Monocyte # 0.6  Eosinophil # 0.2  Basophil # 0.0 (Result(s) reported on 29 Jul 2013 at 05:24AM.)   Assessment/Plan:  Assessment/Plan:  Assessment 1) anemia and heme positive stool in the setting of asa/coumadin usage.   2) multiple medical problems with h/o cva, carotid artery stenosis, copd.   Plan 1) egd today.  I have discussed the risks benefits and complications of egd to include not limited to bleeding infection perforation and sedation and she wishes to proceed. further recs to follow.   Electronic Signatures: Loistine Simas (MD)  (Signed 30-Mar-15 07:35)  Authored: Chief Complaint, VITAL SIGNS/ANCILLARY NOTES, Brief Assessment, Lab Results, Assessment/Plan   Last Updated: 30-Mar-15 07:35 by Loistine Simas (MD)

## 2014-08-23 NOTE — H&P (Signed)
PATIENT NAME:  Michele Matthews, Michele Matthews MR#:  [id number] DATE OF BIRTH:  Apr 22, 1946  DATE OF ADMISSION:  11/22/2013  PRIMARY CARE PROVIDER: Dr. Benita Stabile  EMERGENCY DEPARTMENT REFERRING PHYSICIAN: Dr. Corky Downs  CHIEF COMPLAINT: Difficulty with vision.  HISTORY OF PRESENT ILLNESS: The patient is a 69 year old white female who has a history of COPD, ongoing tobacco abuse, hypertension, hyperlipidemia, previous CVA with chronic left lower extremity weakness who also has bilateral carotid stenosis. According to the husband, last check about 2 years ago, she had 1 side that was completely occluded. He is not sure which side and then the other side had 60% occlusion. Was seen by vascular at that time. The patient actually was also hospitalized here in May 2015 with a seizure. At that time she was started on Keppra. Presents today with difficulty with vision in both eyes since last night. She feels like there is a lot of glare in her vision and she cannot barely see anything. On further questioning, on physical examination as well, was noted that her vision on the left side is better than the right side, when her right eye is covered. She has chronic weakness of the left lower extremity, but she is also complaining of left upper extremity weakness and some stiffness. She denies any fevers, chills. No chest pain. No shortness of breath. No nausea, vomiting or diarrhea. She is on chronic Coumadin after her stroke in 2000. When asked the husband reports that she was placed on that for bilateral carotid stenosis. There is no history of atrial fibrillation that they know of.  PAST MEDICAL HISTORY: 1.  Listed as having coronary artery disease. Husband states that she does not have coronary artery disease. 2.  CVA and a stroke. 3.  Peripheral vascular disease with bilateral carotid stenosis.  4.  Anxiety.  5.  COPD. 6.  Hypertension.  7.  Esophagitis and gastritis with a GI bleed in March 2014. Was on aspirin up until then  and has been discontinued.  8.  Nicotine addiction.   PAST SURGICAL HISTORY: None.   SOCIAL HISTORY: Lives with her husband. Continues to smoke 5 to 6 cigarettes a day. No alcohol or drug use.   FAMILY HISTORY: History of stroke.   ALLERGIES: None.  HOME MEDICATIONS: She is on warfarin 5 mg Monday, Wednesday and Friday. She takes 2.5 mg Sunday, Tuesday, Thursday and Saturday. Tylenol PM 2 tabs at bedtime, sertraline 200 at bedtime, Protonix 40 mg 1 tab p.o. b.i.d., lorazepam 1 mg q. 6 p.r.n., lisinopril 20 mg 1 tablets p.o. daily as needed if blood pressure systolic greater than 536, Keppra 1000 mg 1 tab p.o. q. 12, iron sulfate 325 mg 1 tab p.o. b.i.d., atorvastatin 10 daily, atenolol 50 one tab p.o. b.i.d.  REVIEW OF SYSTEMS: CONSTITUTIONAL: Denies any fevers. No pain. No weight loss. No weight gain.  EYES: Difficulty with vision in both eyes. No pain. No redness. No inflammation. No glaucoma.  ENT: No tinnitus. No ear pain. No hearing loss. No seasonal or year-round allergies. No epistaxis. No discharge. No difficulty swallowing.  RESPIRATORY: Denies any cough, wheezing, hemoptysis. No COPD. No TB.  CARDIOVASCULAR: Denies any chest pain, orthopnea, edema or arrhythmia.  GASTROINTESTINAL: No nausea, vomiting, diarrhea. No abdominal pain. No hematemesis. No melena. No IBS. History of gastritis, esophagitis.  GENITOURINARY: Denies any dysuria, hematuria, renal calculus or frequency.  ENDOCRINE: Denies any polyuria, nocturia or thyroid problems.  HEMATOLOGIC AND LYMPHATIC: Denies any easy bruisability or bleeding.  SKIN: No acne.  No rash.  MUSCULOSKELETAL: Has osteoarthritis-related pain. NEUROLOGIC: Complains of difficulty with vision in both eyes. Previous history of CVA.  PSYCHIATRIC: History of anxiety.   PHYSICAL EXAMINATION: VITAL SIGNS: Temperature 98.4, pulse 69, respirations 17, blood pressure 124/75, O2 100%.  GENERAL: The patient is a very thin female, currently frustrated  because she has to stay in the hospital.  HEENT: Head atraumatic, normocephalic. Pupils equally round and reactive to light and accommodation. There is no conjunctival pallor. No scleral icterus. Nasal exam shows no drainage or ulceration. Oropharynx is clear without any exudate. External ear exam shows no erythema or drainage.  NECK: Supple without any JVD. No significant carotid bruits appreciated.  CARDIOVASCULAR: Regular rate and rhythm. No murmurs, rubs, clicks, or gallops.  LUNGS: Clear to auscultation bilaterally without any rales, rhonchi, wheezing.  ABDOMEN: Soft, nontender, nondistended. Positive bowel sounds x4.  EXTREMITIES: No clubbing, cyanosis, or edema.  SKIN: No rash.  LYMPHATICS: No lymph nodes palpable.  VASCULAR: DP and PT pulses intact.  LYMPHATICS: No lymph nodes palpable.  NEUROLOGICAL: The patient is awake, alert and oriented x3. Cranial nerves II through XII grossly appear intact. She has significant diminished vision in the right eye. With the left eye, she is able to see more, but I am not able to exactly localize the deficit. Strength is diminished in the left upper extremity and left lower extremity bilateral, 4/5. Right has mild weakness. Babinski downgoing. Reflexes 2+.   DIAGNOSTIC DATA: CT scan of the head without contrast shows acute to subacute left PCA infarct. No associated mass effect or hemorrhage. Chronic right MCA infarct encephalomalacia.   Glucose 83, BUN 12, creatinine 0.76, sodium 140, potassium 4.3, chloride 106, CO2 27, calcium 9. LFTs are normal. CBC is normal. INR 1.3.   CT scan of the head shows chronic right MCA infarct encephalomalacia, acute to subacute left PCA infarct.   ASSESSMENT AND PLAN: The patient is a 69 year old with severe peripheral vascular disease with bilateral carotid artery stenosis, one side completely occluded, presents with loss of vision in the right eye.  1.  Acute vision loss with new onset cerebrovascular accident. On  chronic Coumadin since 2001. At this point, I am not certain if she had atrial fibrillation or this was used for her occlusions. At this time, I will continue. Not taking aspirin, which I will start. Due to her complex anticoagulation therapy, I have asked neuro to see. We will get MRI of the brain, MRA of the brain, carotids and echocardiogram.  2.  Hypertension. We will hold blood pressure to allow permissive hypertension for cerebral perfusion for now unless her blood pressure is extremely elevated.  3.  Chronic obstructive pulmonary disease. I will place her on nebulizers p.r.n. 4.  Gastroesophageal reflux disease. We will continue Protonix b.i.d.  5.  History of seizure. We will continue Keppra.  6.  Nicotine addiction. The patient counseled regarding smoking cessation, 4 minutes spent. Recommended to stop. Nicotine patch will be started.  TIME SPENT: 55 minutes.   ____________________________ Lafonda Mosses Posey Pronto, MD shp:sb D: 11/22/2013 13:48:42 ET T: 11/22/2013 14:06:01 ET JOB#: [id number]  cc: Amoy Steeves H. Posey Pronto, MD, <Dictator> Alric Seton MD ELECTRONICALLY SIGNED 11/30/2013 11:07

## 2014-08-23 NOTE — Consult Note (Signed)
PATIENT NAME:  Michele Matthews, Michele Matthews MR#:  [id number] DATE OF BIRTH:  1945-07-07  DATE OF CONSULTATION:  09/09/2013  REFERRING PHYSICIAN:   CONSULTING PHYSICIAN:  Leotis Pain, MD  REASON FOR CONSULTATION: Seizure  HISTORY OF PRESENT ILLNESS: This is a 69 year old female with history of COPD, tobacco use, hypertension, hyperlipidemia, and history of stroke presenting with a week history of generalized weakness. The patient was found to have a urinary tract infection and started on Rocephin. The patient's hospital course is complicated with history of seizures this morning x3 described as generalized tonic-clonic. Last seizure was while the patient was obtaining an EEG. The patient was loaded on Keppra 1 gram and started on 1 gram b.i.d. Despite that she had another seizure and the patient was intubated for airway protection, started on Versed drip as well as Fentanyl. No prior history of seizures.   REVIEW OF SYSTEMS: Unable to obtain at this point due to the patient's medical condition and intubation.  PAST MEDICAL HISTORY: Tobacco use, coronary artery disease, stroke, anxiety, COPD, hypertension, esophagitis and gastritis.   SOCIAL HISTORY: The patient lives with her husband  and smokes 6 cigarettes per day. No EtOH or alcohol use.   FAMILY HISTORY: Positive for stroke.   HOME MEDICATIONS: Includes Coumadin 5 mg Monday, Wednesday, and Friday and 2.5 Sunday, Tuesday, Thursday, and Saturday, Tylenol, Zoloft, pantoprazole, Ativan, lisinopril, iron, atorvastatin, atenolol, Protonix.  DIAGNOSTIC DATA: Laboratory work-up has been reviewed. Imaging has been reviewed.   PHYSICAL EXAMINATION:  VITAL SIGNS: The patient's pulse is 92, respirations 25, blood pressure 142/79, pulse ox 100%.  NEUROLOGIC: The patient is sedated and does not respond to any visual commands. Pupils are 2 mm and very sluggish to react along with disconjugate gaze. Does not respond to visual threats. Motor strength: The patient  withdraws from painful stimuli, left-sided more than right-sided.   IMPRESSION: A 69 year old female with history of chronic obstructive pulmonary disease, tobacco abuse, hypertension, right frontoparietal stroke with some residual left-sided weakness, presents with generalized weakness, found to have a urinary tract infection, started on Rocephin, hospital course complicated with generalized tonic-clonic seizures x3, loaded on Keppra, started on Versed drip and Fentanyl.   PLAN: Continue the Keppra at a gram b.i.d. She is status post 1 gram load, currently getting a repeat EEG date. Will update to see if the patient has nonconvulsive status, which is unlikely as she has disconjugate gaze at this point and we will update on further antiepileptics if needed, but at this point would be inclined just to keep it at a gram q. 12. The patient also has magnesium of 1.6. Advised to supplement magnesium with supra-mag protocol to try to keep magnesium of 2.5 if possible. The patient has a urinary tract infection. Likely urinary tract infection has lowered the patient's seizure threshold and she has a right frontoparietal encephalopathy which is most likely origination of her seizure with secondary generalization. Continue the Versed drip and Fentanyl until tomorrow. If no seizure activity, will start titrating down the Versed drip at that point. Thank you. It was a pleasure seeing this patient. Discuss with primary team and nursing staff. If the patient's family have any questions, please page me and I will call and talk to them over the phone.   ____________________________ Leotis Pain, MD yz:sb D: 09/09/2013 12:36:01 ET T: 09/09/2013 12:51:18 ET JOB#: [id number]  cc: Leotis Pain, MD, <Dictator> Leotis Pain MD ELECTRONICALLY SIGNED 09/30/2013 18:40

## 2014-08-23 NOTE — Discharge Summary (Signed)
PATIENT NAME:  Michele Matthews, Michele Matthews MR#:  [id number] DATE OF BIRTH:  06-22-1945  DATE OF ADMISSION:  07/26/2013 DATE OF DISCHARGE:  07/29/2013   ADMITTING PHYSICIAN:  Dr. Fritzi Mandes  PRIMARY CARE PHYSICIAN: Dr. Benita Stabile  CONSULTATIONS IN THE HOSPITAL: Gastroenterologist consultation by Dr. Gustavo Lah.   DISCHARGE DIAGNOSES: 1. Acute on chronic anemia requiring 2 units packed red blood cell transfusion this admission.  2. Erosive esophagitis, gastritis and duodenitis noted on EGD.  3. Hypertension.  4. Hyperlipidemia.  5. Depression and anxiety.  6. History of cerebrovascular accident secondary to a higher percent right carotid artery stenosis and 50% left coronary artery stenosis on Coumadin.   DISCHARGE HOME MEDICATIONS:  1. Ativan 1 mg q. 6 hours p.r.n. for anxiety.  2. Sertraline 200 mg p.o. daily.  3. Atenolol 50 mg p.o. b.i.d. 4. Atorvastatin 10 mg p.o. at bedtime.  5. Lisinopril 20 mg p.o. daily.  6. Tylenol PM 500 mg/25 mg 2 tablets at bedtime as needed.  7. Ensure 3 times a day with meals.  8. Coumadin 5 mg on Monday, Wednesday, Friday and 2.5 mg on Sunday, Tuesday, Thursday, and Saturday. Advised to start it on 08/02/2013.   9. Ferrous sulfate 325 mg p.o. b.i.d.  10. Protonix 40 mg p.o. b.i.d.    DISCHARGE DIET: Low-sodium diet.   DISCHARGE ACTIVITY: As tolerated.    FOLLOWUP INSTRUCTIONS:  1. PCP follow-up in 2 weeks.  2. Vascular follow-up in 2 weeks for carotid stenosis.  3. Gastrointestinal follow-up in 3-4 weeks.  4. Advised to hold Coumadin until 08/02/2013.   LABS AND IMAGING STUDIES PRIOR TO DISCHARGE:  WBC 5.2, hemoglobin 9.3, hematocrit 28.7, platelet count is 241,000. INR 1.3.   Sodium 139, potassium 3.7, chloride 109, bicarbonate 27, BUN 8, creatinine 0.70. Glucose of 82. Calcium of 8.3   BRIEF HOSPITAL COURSE:   Ms. Eisman is a very pleasant 69 year old female with past medical history significant for COPD, prior history of stroke and right internal carotid  artery 100% occlusion on Coumadin, hypertension, hyperlipidemia who presents to the hospital sent in from PCPs office secondary to low hemoglobin 6.8 and also was symptomatic with weakness and fatigue.   1. Acute on chronic anemia secondary to GI bleed. The patient did receive 2 units of packed RBC transfusion to improve her hemoglobin to 9.0. She did not have any active bleeding while in the hospital and she was hypotensive and tachycardic initially. That improved with transfusion. She did have an upper GI endoscopy done by Dr. Gustavo Lah, which revealed significant erosive esophagitis, gastritis and duodenitis. Her Coumadin was held in the hospital. She is on Protonix b.i.d. at the time of discharge. Coumadin can be restarted in a couple of days and she will have to follow up with vascular and GI as an outpatient.   2. History of embolic stroke with right internal carotid artery stenosis and left-sided weakness. Coumadin has been held due to gastrointestinal bleed. It can be restarted as an outpatient. Because of the  significant erosive gastritis and esophagitis, since the patient will be on Coumadin, aspirin has been stopped at the time of discharge to reduce the risk of bleeding especially when she is on Coumadin.  3. Hypertension. Her home blood pressure medications are being restarted at the time of discharge as they were held in the hospital on admission due to her low blood pressure.   Her course has been otherwise uneventful in the hospital.   DISCHARGE CONDITION: Stable.   DISCHARGE DISPOSITION: Home.  TIME SPENT ON DISCHARGE: Forty minutes.    ____________________________ Gladstone Lighter, MD rk:tc D: 07/29/2013 13:32:50 ET T: 07/29/2013 20:21:45 ET JOB#: [id number]  cc: Gladstone Lighter, MD, <Dictator> Gladstone Lighter MD ELECTRONICALLY SIGNED 08/08/2013 13:58

## 2014-08-23 NOTE — Consult Note (Signed)
PATIENT NAME:  Michele Matthews, Michele Matthews MR#:  [id number] DATE OF BIRTH:  Feb 22, 1946  DATE OF CONSULTATION:  11/24/2013  CONSULTING PHYSICIAN:  Dionisio David, MD  INDICATION FOR CONSULTATION: Possible thrombus versus myxoma in the left atrium with history of CVA.    HISTORY OF PRESENT ILLNESS: This is a 69 year old white female with a past medical history of COPD, history of 4 strokes in the past, hyperlipidemia, hypertension, who presented to the Emergency Room this time with another episode of acute stroke.  This time she had difficulty seeing things. She was unable to see any objects on the left side. She has also been feeling weak. She was started on Coumadin after being seen by neurology. I was asked to evaluate the patient for possible transesophageal echocardiogram.  The echo showed left atrial mass versus artifact versus myxoma. The quality of the study was suboptimal. With history of CVA it is important that further evaluation should be done, thus, I was asked to evaluate the patient.   PAST MEDICAL HISTORY: History of CVA x 3 prior to this one; history of peripheral vascular disease, carotid stenosis, anxiety disorder, COPD, hypertension, GI reflux, nicotine addiction.   SOCIAL HISTORY: The patient continues to smoke 5-6 cigarettes a day. No history of EtOH abuse.   FAMILY HISTORY: Positive for stroke.  ALLERGIES: None.   HOME MEDICATIONS: Coumadin prior to coming in, Protonix, lisinopril, Keppra and atenolol.   PHYSICAL EXAMINATION:  GENERAL: She is alert and oriented, but unable to visualize things, on the left side especially. She has decreased visual acuity.  VITAL SIGNS: Her blood pressure is 122/71, respirations 16, pulse 79, temperature 98.6, saturation 94%.  HEENT: The patient's left visual acuity is negligible when she puts her hand over the right eye. No JVD, no carotid bruit.   LUNGS: Clear.  HEART: Regular rate and rhythm. Normal S1, S2. No audible murmur.  ABDOMEN: Soft,  nontender, positive bowel sounds.  EXTREMITIES: No pedal edema.  NEUROLOGIC: She appears to be intact.   DIAGNOSTIC STUDIES: Her EKG shows sinus rhythm, 68 beats per minute, otherwise within normal limits.   LABORATORY DATA: BUN 16, creatinine 0.72, TSH is 5.84, hemoglobin A1c 5.5. Cholesterol 193, triglycerides 150, LDL 94.  ASSESSMENT AND PLAN: History of multiple cerebral vascular accidents. Questionable artifact versus thrombus versus myxoma on the left atrium. Will set up for transesophageal echocardiogram in the morning to further evaluate since the quality of the echo was suboptimal.  Thank you very much for this referral.     ____________________________ Dionisio David, MD sak:lt D: 11/24/2013 14:06:30 ET T: 11/24/2013 14:46:20 ET JOB#: [id number]  cc: Dionisio David, MD, <Dictator> Dionisio David MD ELECTRONICALLY SIGNED 12/05/2013 12:02

## 2014-08-23 NOTE — Consult Note (Signed)
PATIENT NAME:  Michele Matthews, Michele Matthews MR#:  [id number] DATE OF BIRTH:  [birth year]  DATE OF CONSULTATION:  03/28/2014  REFERRING PHYSICIAN:   CONSULTING PHYSICIAN:  Gonzella Lex, MD  REFERRING PHYSICIAN:  CONSULTING PHYSICIAN: Gonzella Lex, MD   IDENTIFYING INFORMATION AND REASON FOR CONSULT: A 69 year old woman with a history of depression, admitted in a state of confusion. Consult for depression.   HISTORY OF PRESENT ILLNESS: Information obtained from the patient and the chart and the patient's family, especially her husband. They report that for the past couple of weeks, she had not seemed like her normal self, emotionally. She has been having anger outbursts that will start in the mid morning and last throughout the day. This is unusual for her. The patient has been aware of feeling more irritable and confused at times. She has chronic visual hallucinations, which have also been more noticeable it sounds like for the last couple of weeks. The patient has continued to sleep adequately with her current medicine. Appetite has been poor. Things got so bad at home with her anger that her husband had left the house for the last 3 days and just came back this afternoon. The patient told the hospitalist that she was extremely depressed and was tearful at that time, requesting to see me. On interview today, she says that since then, her husband has shown back up, and so she is feeling much better, no longer tearful, and her depression is better. There have been no changes recently to any medications that she is prescribed. She does not abuse any drugs or alcohol.   PAST PSYCHIATRIC HISTORY: History of depression that goes back probably at least 2002 and originally may have been related to a stroke. The patient has been treated with Zoloft at a dose of 100 mg twice a day for years. It is currently prescribed by Dr. Hall Busing. Before her last hospitalization in July, at which time she had developed seizures, she had  had some confusion leading up to it, although the anger seems to be a little bit different than what she was having at that time. The patient denies having been admitted to a psychiatric hospital. She denies making any serious suicide attempts in the past.   SOCIAL HISTORY: Lives with her husband, has an adult son who lives nearby. She and her husband have been together 28 years. It sounds like usually they have a close bond, and he helps her out a great deal, especially with her medicine. For the last several days, however, he left home because he was getting so frustrated with the anger outbursts she was having.   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 History of high blood pressure. Also on chronic anticoagulant therapy for what was thought to be a clot-related stroke. Currently, has a bad urinary tract infection, being treated with IV medication.   FAMILY HISTORY: Grandfather committed suicide.   SUBSTANCE ABUSE HISTORY: She used to drink but says she stopped about 6 months ago and has not resumed it. Denies any drug abuse.   REVIEW OF SYSTEMS: Mood has been more labile. Visual hallucinations are chronic and have been a little bit worse for the last week or 2. Not reporting any acute pain. She is feeling tired. The rest of the review of systems is noncontributory.   MENTAL STATUS EXAMINATION: Elderly woman, looks her stated age or older, interviewed in her hospital bed. She was very pleasant,  cooperative and forthcoming. Eye contact good. Psychomotor activity seemed pretty normal. Speech normal rate, tone and volume. Affect was euthymic, reactive and appropriate. Mood was stated as being better. Thoughts were overall lucid. No sign of acute delusional thinking. She denies suicidal or homicidal ideation. She could recall 3/3 objects immediately, but it took her some effort and could only remember 1/3 at 3 minutes. She was alert and oriented x4,  however.   LABORATORY RESULTS: Chemistry panel just showed an elevated glucose only at 107, chloride elevated at 110, nothing else remarkable. Renal function is okay. CBC was entirely normal. Urine culture grows greater than 100,000 gram-negative rods. Urinalysis strongly positive for infection.   No change to EKG.   VITAL SIGNS: Currently, blood pressure 132/68, respirations 18, pulse 67, temperature 98.2.   ASSESSMENT: A 69 year old woman who has depressive symptoms, also with anger and irritability, recently. Chronic visual hallucinations, but she has insight into them, The visual hallucinations are probably largely related to her history of strokes. More worrisome is that she has had a change in her mood recently and then for the last few days became what sounds like frankly delirious, which is now clearing up. As I presented to the patient, this could be all related to her infection or another medical cause. One option would be to not change any of her medication except to treat her urinary tract infection and observe how she is doing. Another option would be to try adding a small dose of aripiprazole 2 mg at night to boost the effectiveness of the antidepressant and to possibly help a little bit with the hallucinations. Pros and cons of each were discussed, and the patient chose to try starting a small dose of aripiprazole, 2 mg at night ordered. I also changed the Zoloft to the correct dose of 100 mg twice a day. I will follow up with her on Monday if she is still in the hospital. If she needs to be seen over the weekend, called the psychiatrist on call.   DIAGNOSIS, PRINCIPAL AND PRIMARY:  AXIS I:  1. Depression, not otherwise specified.  2. 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    ____________________________ Gonzella Lex, MD jtc:je D: 03/28/2014 14:25:04 ET T: 03/28/2014 14:39:01 ET JOB#: [id number]  cc: Gonzella Lex, MD, <Dictator> Gonzella Lex  MD ELECTRONICALLY SIGNED 04/11/2014 19:08

## 2014-08-23 NOTE — Consult Note (Signed)
Referring Physician:  Alric Seton   Primary Care Physician:  Alric Seton : [place], [address], [place], [place] [address], [place] [phone]  Reason for Consult: Admit Date: 22-Nov-2013  Chief Complaint: loss of vision  Reason for Consult: CVA   History of Present Illness: History of Present Illness:   69 yo RHD F presents to [facility] secondary to acute onset of vision loss.  Pt states that she just cant see and that she noted it yesterday but thought it would go away but it has persisted.  Pt has some baseline L hemiparesis due to an old stroke.  She denies any new weakness, tingling or speech problems.    ROS:  General denies complaints   HEENT loss of vision   Lungs no complaints   Cardiac no complaints   GI no complaints   GU no complaints   Musculoskeletal no complaints   Extremities no complaints   Skin no complaints   Neuro no complaints   Endocrine no complaints   Psych no complaints   Past Medical/Surgical Hx:  tobacco use: per chart notes  CAD: R artery occlusion per chart notes  CVA/Stroke:   Anxiety:   copd:   Bronchitis:   Hypertension:   Past Medical/ Surgical Hx:  Past Medical History reviewed by me as above   Past Surgical History reviewed by me as above   Home Medications: Medication Instructions Last Modified Date/Time  levETIRAcetam 1000 mg oral tablet 1 tab(s) orally every 12 hours 24-Jul-15 10:20  ferrous sulfate 325 mg (65 mg elemental iron) oral tablet 1 tab(s) orally 2 times a day (with meals) 24-Jul-15 10:20  pantoprazole 40 mg oral delayed release tablet 1 tab(s) orally 2 times a day 24-Jul-15 10:20  Tylenol PM 500 mg-25 mg oral tablet 2 tab(s) orally once a day (at bedtime) 24-Jul-15 10:20  warfarin 5 mg oral tablet 1 tab(s) orally Monday, Wednesday, and Friday. take 1/2 tab (2.8m) orally once a day on Sunday, Tuesday, Thursday, and Saturday. START FROM 08/02/13 24-Jul-15 10:20   LORazepam 1 mg oral tablet 1 tab(s) orally every 6 hours, As Needed 24-Jul-15 10:20  sertraline 100 mg oral tablet 2 tabs (2036m orally once a day 24-Jul-15 10:20  atenolol 50 mg oral tablet 1 tab(s) orally 2 times a day 24-Jul-15 10:20  atorvastatin 10 mg oral tablet 1 tab(s) orally once a day (at bedtime) 24-Jul-15 10:20  lisinopril 20 mg oral tablet 1 tab(s) orally once a day, As Needed only if blood pressure is greater than 150. 24-Jul-15 10:20   Allergies:  No Known Allergies:   Allergies:  Allergies NKDA   Social/Family History: Employment Status: disabled  Lives With: children  Living Arrangements: house  Social History: + tob, no EtOH, no illicits  Family History: no strokes, no seizures   Vital Signs: **Vital Signs.:   24-Jul-15 15:48  Vital Signs Type Admission  Temperature Temperature (F) 98.8  Celsius 37.1  Temperature Source oral  Pulse Pulse 64  Respirations Respirations 18  Systolic BP Systolic BP 14270Diastolic BP (mmHg) Diastolic BP (mmHg) 74  Mean BP 98  Pulse Ox % Pulse Ox % 95  Pulse Ox Activity Level  At rest  Oxygen Delivery Room Air/ 21 %   Physical Exam: General: nl weight, NAD  HEENT: normocephalic, sclera nonicteric, oropharynx clear  Neck: supple, no JVD, no bruits  Chest: CTA B, no wheezing, good movement  Cardiac: RRR, no murmurs, no edema, 2+  pulses  Extremities: no C/C/E, FROM   Neurologic Exam: Mental Status: alert and oriented x 3, normal speech and language, follows complex commands  Cranial Nerves: PERRLA, EOMI, L homonymous hemianopsia, face symmetric, tongue midline, shoulder shrug equal  Motor Exam: 5/5 R, 5-/5 L, nl tone  Deep Tendon Reflexes: Babinski on L, slightly more brisk on L  Sensory Exam: no neglect, nl pin and temp  Coordination: FTN and HTS WNL   Lab Results: Hepatic:  24-Jul-15 09:33   Bilirubin, Total 0.3  Alkaline Phosphatase 82 (46-116 NOTE: New Reference Range 11/19/13)  SGPT (ALT) 16 (14-63 NOTE:  New Reference Range 11/19/13)  SGOT (AST) 28  Total Protein, Serum 7.3  Albumin, Serum 3.4  Routine Chem:  24-Jul-15 09:33   Glucose, Serum 83  BUN 12  Creatinine (comp) 0.76  Sodium, Serum 140  Potassium, Serum 4.3  Chloride, Serum 106  CO2, Serum 27  Calcium (Total), Serum 9.0  Osmolality (calc) 278  eGFR (African American) >60  eGFR (Non-African American) >60 (eGFR values <56m/min/1.73 m2 may be an indication of chronic kidney disease (CKD). Calculated eGFR is useful in patients with stable renal function. The eGFR calculation will not be reliable in acutely ill patients when serum creatinine is changing rapidly. It is not useful in  patients on dialysis. The eGFR calculation may not be applicable to patients at the low and high extremes of body sizes, pregnant women, and vegetarians.)  Anion Gap 7  Routine UA:  24-Jul-15 11:17   Color (UA) Yellow  Clarity (UA) Clear  Glucose (UA) Negative  Bilirubin (UA) Negative  Ketones (UA) Negative  Specific Gravity (UA) 1.018  Blood (UA) Negative  pH (UA) 5.0  Protein (UA) Negative  Nitrite (UA) Negative  Leukocyte Esterase (UA) Negative (Result(s) reported on 22 Nov 2013 at 11:30AM.)  RBC (UA) 1 /HPF  WBC (UA) 1 /HPF  Bacteria (UA) NONE SEEN  Epithelial Cells (UA) NONE SEEN  Result(s) reported on 22 Nov 2013 at 11:30AM.  Routine Coag:  24-Jul-15 09:33   Prothrombin  15.9  INR 1.3 (INR reference interval applies to patients on anticoagulant therapy. A single INR therapeutic range for coumarins is not optimal for all indications; however, the suggested range for most indications is 2.0 - 3.0. Exceptions to the INR Reference Range may include: Prosthetic heart valves, acute myocardial infarction, prevention of myocardial infarction, and combinations of aspirin and anticoagulant. The need for a higher or lower target INR must be assessed individually. Reference: The Pharmacology and Management of the Vitamin K   antagonists: the seventh ACCP Conference on Antithrombotic and Thrombolytic Therapy. [id number].3875Sept:126 (3suppl): [id number] A HCT value >55% may artifactually increase the PT.  In one study,  the increase was an average of 25%. Reference:  "Effect on Routine and Special Coagulation Testing Values of Citrate Anticoagulant Adjustment in Patients with High HCT Values." American Journal of Clinical Pathology 2006;126:400-405.)  Routine Hem:  24-Jul-15 09:33   WBC (CBC) 9.7  RBC (CBC) 4.41  Hemoglobin (CBC) 13.4  Hematocrit (CBC) 41.6  Platelet Count (CBC) 164 (Result(s) reported on 22 Nov 2013 at 10:18AM.)  MCV 94  MCH 30.5  MCHC 32.3  RDW 13.9   Radiology Results: [place]    24-Jul-15 15:43, [place] Doppler Bilateral  [place] Doppler Bilateral   REASON FOR EXAM:    right sided vision loss  COMMENTS:       PROCEDURE: [place] - [place]OT[place] DOPPLER BILATERAL  - Nov 22 2013  3:43PM  CLINICAL DATA:  Right-sided vision loss    EXAM:  BILATERAL CAROTID DUPLEX ULTRASOUND    TECHNIQUE:  Pearline Cables scale imaging, color Doppler and duplex ultrasound were  performed of bilateral carotid and vertebral arteries in the neck.    COMPARISON:  [facility] 07/12/2007  FINDINGS:  Criteria: Quantification of carotid stenosis is based on velocity  parameters that correlate the residual internal carotid diameter  with NASCET-based stenosis levels, using the diameter of the distal  internal carotid lumen as the denominator for stenosis measurement.    The following velocity measurements were obtained:    RIGHT    ICA:  Notapplicable cm/sec    CCA:  Occluded cm/sec    SYSTOLIC ICA/CCA RATIO:  DIASTOLIC ICA/CCA RATIO:  Not applicable    ECA:  45 cm/sec    LEFT    ICA:  289 cm/sec    CCA:  623 cm/sec    SYSTOLIC ICA/CCA RATIO:  1.8    DIASTOLIC ICA/CCA RATIO:    ECA:  201 cm/sec  RIGHT CAROTID ARTERY: The right common carotid artery remains  occluded. The right internal carotid bulb and  internal carotid  artery reconstitute via collateral vessels. There is to and fro flow  in the internal carotid artery.    RIGHT VERTEBRAL ARTERY: Trace flow in the right vertebral artery is  identified. It is primarily retrograde.    LEFT CAROTID ARTERY: There is extensive calcified plaque in the  bulb. Low resistance internal carotid Doppler pattern.    LEFT VERTEBRAL ARTERY:  Antegrade with a normal Doppler pattern.     IMPRESSION:  The right common carotid artery remains occluded. The bulb and  internal carotid artery reconstituted via collateral vessels. There  is to and fro flow in the right internal carotid artery.    Only trace flow in the right vertebral artery which is primarily  retrograde.    Greater than 70% stenosis in the left internal carotid artery based  on the elevated peak systolic velocity.      Electronically Signed    By: Maryclare Bean M.D.    On: 11/22/2013 15:57       Verified By: Jamas Lav, M.D.,  CT:    24-Jul-15 10:14, CT Head Without Contrast  CT Head Without Contrast   REASON FOR EXAM:    CVA  COMMENTS:   May transport without cardiac monitor    PROCEDURE: CT  - CT HEAD WITHOUT CONTRAST  - Nov 22 2013 10:14AM     CLINICAL DATA:  69 year old female with loss of vision. Initial  encounter. Possible stroke    EXAM:  CT HEAD WITHOUT CONTRAST    TECHNIQUE:  Contiguous axial images were obtained from the base of the skull  through the vertex without intravenous contrast.  COMPARISON:  09/09/2013 and earlier.    FINDINGS:  Stable and negative paranasal sinuses and mastoids. No acute osseous  abnormality identified. Visualized orbits and scalp soft tissues are  within normal limits.    New hypodensity in the left occipital and inferior parietal lobes in  a cytotoxic edema pattern (series 2, image 18). No mass effect. No  acute intracranial hemorrhage identified.    Elsewhere stable gray-white matter differentiation, with chronic  right  MCA territory encephalomalacia. Ex vacuo changes to the right  lateral ventricle. No ventriculomegaly.  Calcified atherosclerosis at the skull base. Stable intracranial  vascular density.     IMPRESSION:  1. Acute/subacute left PCA infarct. No associated mass effect or  hemorrhage.  2. Chronic right MCA infarct/encephalomalacia.      Electronically Signed    By: Lars Pinks M.D.    On: 11/22/2013 10:23         Verified By: Gwenyth Bender. HALL, M.D.,   Radiology Impression: Radiology Impression: CT of head personally reviewed by me and shows old R MCA infarct and acute L PCA infarct   Impression/Recommendations: Recommendations:   prior notes reviewed by me  reviewed by me   L PCA acute infarct-  symptomatic causing blindness in combination with old R MCA infarct;  etiology is likely cardioembolic but could be thromboembolic as well if pt has a L fetal PCA. Old R MCA infarct-  stable, mild hemiparesis start ASA EC 52m daily echo pending d/c MRI needs CTA of head and neck to look at vascular territory pending lipids permissive HTN ok now will follow  Electronic Signatures: SJamison Neighbor(MD)  (Signed 24-Jul-15 18:04)  Authored: REFERRING PHYSICIAN, Primary Care Physician, Consult, History of Present Illness, Review of Systems, PAST MEDICAL/SURGICAL HISTORY, HOME MEDICATIONS, ALLERGIES, Social/Family History, NURSING VITAL SIGNS, Physical Exam-, LAB RESULTS, RADIOLOGY RESULTS, Recommendations   Last Updated: 24-Jul-15 18:04 by SJamison Neighbor(MD)

## 2014-08-23 NOTE — Discharge Summary (Signed)
PATIENT NAME:  Michele Matthews, Michele Matthews MR#:  [id number] DATE OF BIRTH:  November 15, 1945  DATE OF ADMISSION:  11/22/2013 DATE OF DISCHARGE:  11/25/2013  DISCHARGE DIAGNOSES: 1.  Acute cerebrovascular accident, likely cardioembolic in nature, with history of atrial fibrillation with left posterior cerebral artery infarct, presenting with acute vision loss, unlikely to improve. Carotid Dopplers showing carotid stenosis and CT angiography confirming thrombus occlusion of the left posterior cerebral artery.  2.  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   SECONDARY DIAGNOSES: 1.  Coronary artery disease.  2.  Cerebrovascular accident.  3.  Peripheral vascular disease.  4.  Anxiety. 5.  Chronic obstructive pulmonary disease.  6.  Hypertension.    CONSULTATIONS:  1.  Neurology, Dr. Valora Corporal.  2.  Cardiology, Dr. Neoma Laming.    PROCEDURES AND RADIOLOGY:  1.  TEE on the 27th of July showed LVEF of 60% to 65%. Moderately dilated left atrium. Possible low flow state, recommending anticoagulation.  2.  Chest x-ray on the 24th of July showed no active disease.  3.  CT scan of the head without contrast on the 24th of July showed acute/subacute left PCA infarct. Chronic right MCA infarct/encephalomalacia.  4.  CT scan of the angio, head/neck on the 24th of July showed severe stenosis/nonocclusive thrombus in the proximal P3 segment of the left PCA, corresponding to the region of acute/subacute infarct, 60% stenosis of the proximal left internal carotid artery, increased from prior. 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  5.  Bilateral carotid Dopplers on the 24th of July showed right common carotid artery which was occluded. Collateral circulation reconstituted. Retrograde flow in the right vertebral artery, very trace. Greater than 70%  stenosis of the left internal carotid artery.  6.  A 2-D echocardiogram on the 25th of July showed LVEF of 65%. Impaired relaxation pattern of LV diastolic filling. 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 Cannot rule out thrombus versus myxoma.   MAJOR LABORATORY PANEL: Urinalysis on admission was negative.   HISTORY AND SHORT HOSPITAL COURSE: The patient is a 69 year old female with the above-mentioned medical problems, who was admitted for difficulty with vision, concerning for stroke. Please see Dr. Chana Bode Patel's dictated history and physical for further details. The patient underwent CT scan of the head, which was concerning for acute/subacute stroke, for which neurology consultation was obtained with Dr. Valora Corporal, who recommended getting a 2-D echo, which was performed, showing possible mass versus thrombus, for which cardiology consultation was obtained with Dr. Neoma Laming, who recommended TEE for confirmation of same, but considering patient's typical possible cardioembolic source of cerebrovascular accident, definite anticoagulation was advised by both neurology and cardiology. TEE was also supporting the same, with low-flow state suggestive of continuing anticoagulation. The patient was not able to have significant vision back, and this was likely expected to stay that way per neurology. Neurology recommended to have an event monitor for 60 days as an outpatient, which was set up by cardiology. On the 27th of July, the patient wanted to go home, and she was medically stable to be discharged with home health services, which was set up by care management.   PERTINENT DISCHARGE PHYSICAL EXAMINATION: VITAL SIGNS: Temperature 97.6, heart rate 76 per minute, respirations 16 per minute, blood pressure 160/69 mmHg. She was saturating 96% on room air.  CARDIOVASCULAR: S1, S2 normal. No  murmurs, rubs, or gallop.  LUNGS: Clear to auscultation bilaterally. No wheezes, rales, rhonchi, or  crepitation.  ABDOMEN: Soft, benign.  NEUROLOGIC: The patient was alert and oriented. Her muscle strength was within normal limits, but she has significant left homonymous hemianopsia, which was likely to stay, per neurology.  All other physical examination remained at baseline.   DISCHARGE MEDICATIONS: 1.  Lorazepam 1 mg by mouth every 6 hours as needed.  2.  Sertraline 100 mg 2 tablets p.o. daily.  3.  Atenolol 50 mg p.o. b.i.d.  4.  Atorvastatin 50 mg p.o. b.i.d.  5.  Lisinopril 20 mg p.o. daily as needed.  6.  Tylenol 500 mg/25 mg 2 tablets p.o. at bedtime. 7.  Warfarin 5 mg 1 tablet on Monday, Wednesday and Friday, and 1/2 tablet on Sunday, Tuesday, Thursday, and Saturday.  8.  Iron sulfate 325 mg p.o. b.i.d.  9.  Protonix 40 mg p.o. b.i.d.  10.  Keppra 1000 mg p.o. b.i.d.  11.  Aspirin 81 mg p.o. daily.  12.  Ensure twice a day.  13.  Nicotine transdermal patch 21 mg.  14.  Docusate/senna 1 tablet p.o. 3 times a day as needed.    DISCHARGE DIET: Low sodium, low fat, low cholesterol.   DISCHARGE ACTIVITY: As tolerated.   DISCHARGE INSTRUCTIONS AND FOLLOW-UP: The patient was instructed to follow up with her primary care physician, Dr. Benita Stabile, in 1 to 2 weeks. She will need follow-up with Dr. Neoma Laming on the 28th of July at 9:30 as scheduled for event monitoring set up. She was also instructed to follow-up with [facility] neurology in 2 to 4 weeks. She remains at very high risk for recurrent stroke and readmission. She was strongly recommended to go to short-term rehabilitation or skilled nursing facility, but she was adamant and refused to go there. She just wanted to go home. She is instructed to get her PT/INR checked on the 29th of July, on Wednesday, and Friday, 31st of July, with results forwarded to primary care physician for Coumadin dose adjustment.   TOTAL TIME DISCHARGING THIS PATIENT: 55 minutes.    ____________________________ Lucina Mellow. Manuella Ghazi,  MD vss:cg D: 11/26/2013 22:57:06 ET T: 11/27/2013 00:17:20 ET JOB#: [id number]  cc: Markita Stcharles S. Manuella Ghazi, MD, <Dictator> Leona Carry. Hall Busing, MD Dionisio David, MD Mila Homer Tamala Julian, MD [facility] Neurology Lucina Mellow [facility] MD ELECTRONICALLY SIGNED 11/28/2013 19:49

## 2014-08-23 NOTE — H&P (Signed)
PATIENT NAME:  Michele Matthews, Michele Matthews MR#:  [id number] DATE OF BIRTH:  23-Nov-1945  DATE OF ADMISSION:  09/08/2013  PRIMARY CARE PHYSICIAN: Sharlet Salina C. Hall Busing, MD   CHIEF COMPLAINT: Weakness and confusion.   HISTORY OF PRESENT ILLNESS: This is a 69 year old very pleasant female who has a history of COPD, ongoing tobacco abuse, hypertension, hyperlipidemia, CVA, who presents with the above complaint. Over the past week, the patient has had increasing weakness. Today, she was unable to get out of bed because she is very weak and she was very confused as per the husband who is next to her. She has had increasing frequency, burning as well. In the ER a UA was positive for UTI, but she is very weak and unable to get up. No focal deficits are noted on physical examination, and as per the patient. She did have some slurred speech at one point very cloudy kind of appearance, as per the husband.   REVIEW OF SYSTEMS:  CONSTITUTIONAL: No fever, fatigue. Positive weakness.  EYES: No blurred or double vision.  HEENT: No hearing loss, snoring, discharge.  RESPIRATORY: No cough, wheezing, hemoptysis. Positive COPD.   CARDIOVASCULAR: No chest pain, orthopnea, palpitations.  GASTROINTESTINAL: No nausea and diarrhea, abdominal pain. Positive history of gastritis esophagitis.  GENITOURINARY: Positive dysuria or hematuria.   ENDOCRINE: No polyuria or polydipsia. HEME/LYMPH: Positive easy bruising. SKIN: No rash or lesions. MUSCULOSKELETAL: Positive generalized weakness.  NEUROLOGIC: Positive history of CVA with left-sided weakness.  PSYCHIATRIC: Positive anxiety,   PAST MEDICAL HISTORY: 1. Tobacco abuse.  2. History of CAD.  3. History of CVA and stroke.  4. History of anxiety.  5. COPD.  6. Hypertension.  7. History of esophagitis and gastritis by EGD in March 2014.   SOCIAL HISTORY: The patient lives with her husband. She smokes 6 cigarettes a day. No alcohol or IV drug use.   FAMILY HISTORY: Positive for stroke.    ALLERGIES: No known drug allergies.   PAST SURGICAL HISTORY: None.   MEDICATIONS: 1. Coumadin 5 mg Monday, Wednesday, Friday; and 2.5 mg Sunday, Tuesday, Thursday, Saturday.  2. Tylenol 2 tablets daily.  3. Zoloft 200 mg daily.  4. Pantoprazole 40 mg b.i.d.  5. Ativan 1 mg q.6 hours p.r.n.  6. Lisinopril 20 mg daily.  7. Ferrous sulfate 325 mg b.i.d.  8. Atorvastatin 10 mg daily.  9. Atenolol 50 mg b.i.d.  10. Protonix 40 mg p.o. b.i.d.   PHYSICAL EXAMINATION: VITAL SIGNS: Temperature 97.9, pulse 89, respirations 18, blood pressure 189/95, and 97% on room air.  GENERAL: The patient is alert, oriented to name and place but not time.  HEENT: Head is atraumatic. Pupils are round and reactive. Sclerae anicteric. Mucous membranes are moist. Oropharynx is clear.  NECK: Supple without JVD, carotid bruits or enlarged thyroid.  CARDIOVASCULAR: Regular rate and rhythm. No murmurs, gallops, or rubs. PMI is not displaced.  LUNGS: Clear to auscultation, no rales or wheezing. Normal to percussion.  ABDOMEN: Bowel sounds are positive. Nontender, nondistended. No hepatosplenomegaly.  EXTREMITIES: No clubbing, cyanosis, or edema.  SKIN: Without rashes or lesions.  NEUROLOGIC: Cranial nerves II through II through 12 are intact. There are no focal deficits. Her left side has decreased strength in her upper extremity.   LABORATORIES: Urinalysis shows 3+ leukocyte esterase with 3+ bacteria, 21 white blood cells. Troponin less than 0.02. Sodium 140, potassium 3.5, chloride 105, bicarbonate 28, BUN 13, creatinine 0.66, glucose 93, bilirubin 0.2, alkaline phosphatase 68, ALT 20, AST 28, total protein  7.3, potassium 3.6. White blood cells 8.8, hemoglobin 13.2, hematocrit 41, platelets 179,000. INR is 2.7. Chest x-ray shows no acute cardiopulmonary disease. CT of the head shows no acute intracranial hemorrhage. EKG showed normal sinus rhythm, no ST-elevation or depression.   ASSESSMENT AND PLAN: This is a  69 year old female with a history of CVA with residual left-sided weakness, who presents with encephalopathy and urinary tract infection.  1. Encephalopathy secondary to urinary tract infection. The patient had reported some slurred speech, but there are no other focal deficits. I think her slurred speech is probably from her generalized weakness, stemming from a urinary tract infection. If we treat the urinary tract infection, I suspect her symptoms will improve.  2. Urinary tract infection. I have started Rocephin. Urine culture has been ordered in the Emergency Room.  3. History of CVA with left-sided residual weakness. The patient is on Coumadin, which we will continue. INR is therapeutic.  4. COPD. This seems to be stable at this time.  5. Recent gastritis and esophagitis. Will continue PPI.  6. History of coronary artery disease. Will continue with atorvastatin and atenolol and she is on Coumadin. Taken off her aspirin during her last hospitalization.  7. Depression and anxiety. Will continue Zoloft and Ativan.  8. The patient is a FULL CODE status.   TIME SPENT: 45 minutes.      ____________________________ Donell Beers. Benjie Karvonen, MD spm:lm D: 09/08/2013 17:15:18 ET T: 09/08/2013 21:27:14 ET JOB#: [id number]  cc: Mireille Lacombe P. Benjie Karvonen, MD, <Dictator> Leona Carry. Hall Busing, MD Donell Beers Rosela Supak MD ELECTRONICALLY SIGNED 09/13/2013 18:15

## 2014-08-23 NOTE — H&P (Signed)
PATIENT NAME:  Michele Matthews, Michele Matthews MR#:  [id number] DATE OF BIRTH:  20-Aug-1945  DATE OF ADMISSION:  03/27/2014  PRIMARY CARE PHYSICIAN: Sharlet Salina C. Hall Busing, MD  CHIEF COMPLAINT: Altered mental status.   HISTORY OF PRESENT ILLNESS: The patient is a 69 year old pleasant Caucasian female who lives with her son, is brought into the ED with a chief complaint of altered mental status for the past 3 days. The patient is not being herself for the past 3 days as reported by the daughter. She is concerned and she is brought into the ED. In the ED, UA is abnormal with positive nitrites and leukocyte esterase. The patient is pleasantly confused. Denies any chest pain, shortness of breath. Denies any abdominal pain, nausea, vomiting. No back pain. No fevers. No sick contacts.  PAST MEDICAL HISTORY:  1.  Coronary artery disease.  2.  CVA with a history of multiple strokes in the past, on Coumadin.  3.  Peripheral vascular disease with bilateral carotid stenosis.  4.  Anxiety.  5.  Chronic obstructive pulmonary disease.  6.  Hypertension.  7.  Esophagitis and gastritis with gastrointestinal bleed in March 2014. The patient was on aspirin until then and was discontinued, but currently it seems like she is on baby aspirin. 8.  Nicotine addiction.   PAST SURGICAL HISTORY: None.   ALLERGIES: No known drug allergies.   PSYCHOSOCIAL HISTORY: Lives with husband. Continues to smoke half a pack a day. No alcohol or illicit drug use.   FAMILY HISTORY: History of stroke.   HOME MEDICATIONS: Coumadin 5 mg on Monday, Wednesday and Friday and 2.5 mg on Sunday, Tuesday, Thursday and Saturday. Tylenol PM 2 tablets p.o. at bedtime, Zoloft 200 mg p.o. at bedtime, Xanas p.o. q. 6 hours as needed for anxiety, lisinopril 20 mg p.o. once daily as needed for blood pressure greater than 150, Keppra 1000 mg 1 tablet p.o. q. 12 hours,  iron sulfate 325 mg 1 tablet p.o. b.i.d., atorvastatin 10 mg daily, atenolol 50 mg p.o. b.i.d.  REVIEW OF  SYSTEMS: Unobtainable, as the patient is with altered mental status.   PHYSICAL EXAMINATION: VITAL SIGNS: Temperature 97.6, pulse 74, respirations 18, blood pressure 148/97, pulse oximetry 95% on room air.  GENERAL APPEARANCE: Not in acute distress. Moderately built and nourished.  HEENT: Normocephalic, atraumatic. Pupils are equally reacting to light and accommodation. No scleral icterus. No conjunctival injection. No sinus tenderness. Moist mucous membranes. NECK: Supple. No JVD. No thyromegaly. No masses felt.  LUNGS: Positive rales at the bases.  CARDIOVASCULAR: S1, S2 normal. Regular rate and rhythm. No murmur.  GASTROINTESTINAL: Soft. Bowel sounds are positive in all 4 quadrants. Nontender, nondistended. No hepatosplenomegaly. No masses felt.  NEUROLOGIC: Awake, alert and oriented x 1-2. Pleasantly confused, following verbal commands. Motor and sensory grossly intact. Reflexes are 2+.  EXTREMITIES: No edema. No cyanosis. No clubbing.  SKIN: Warm to touch. Normal turgor. No rashes. No lesions.  MUSCULOSKELETAL: No joint effusion, tenderness, erythema.  PSYCHIATRIC: Mood and affect could not be elicited as the patient is with altered mental status.   LABORATORY RESULTS: LFTs are normal. Troponin less than 0.02. CBC normal. PT 17.5, INR 1.5. Urinalysis: Glucose negative, bilirubin negative, protein negative, nitrites positive, leukocyte esterase 3+, hyaline casts are present. BMP: Glucose 107, BUN 26, chloride 110, anion gap 5. The rest of the BMP is normal.   IMAGING STUDIES: Chest x-ray is ordered, which is pending. Twelve-lead EKG: Normal sinus rhythm at 75 beats per minute, normal PR and QRS  interval. No acute ST-T wave changes.   ASSESSMENT AND PLAN: A 69 year old Caucasian female brought into the ED by her daughter, with a chief complaint of altered mental status for the past 3 days, diagnosed with urinary tract infection.  1.  Acute altered mental status from acute cystitis. We will  admit her to medical/surgical floor. Urine culture is ordered. The patient will be provided with gentle hydration with IV fluids. We will provide only 1 liter. The patient was given levofloxacin and we will continue levofloxacin IV daily. Chest x-ray PA and lateral views are ordered, which is pending at this time.  2.  Multiple histories of strokes in the past. The patient is on Coumadin which is not therapeutic. We will continue Coumadin, increase the dose of Coumadin to 6 mg until INR is therapeutic. We will continue close monitoring.  3.  Hypertension. Continue her home medication atenolol and lisinopril as needed basis for her systolic blood pressure greater than 150.  4.  Chronic obstructive pulmonary disease. Continue nebulizer treatments.  5.  History of seizures. Continue Keppra, which is her home medication.   6.  Coronary artery disease. Continue beta blocker and statin. The patient is on aspirin, will continue 81 mg of baby aspirin.  7.  Nicotine addiction. The patient was counseled to quit smoking for 4 to 5 minutes and we will provide her nicotine patch.  8.  We will provide her gastrointestinal prophylaxis with Pepcid.  9.  Deep venous thrombosis prophylaxis with Lovenox subcutaneous as INR is subtherapeutic.   CODE STATUS: She is full code. Husband is the medical power of attorney.   Plan of care was discussed in detail with the patient's daughter at bedside. She appears understanding of the plan.   TOTAL TIME SPENT ON ADMISSION: Fifty minutes.   ____________________________ Nicholes Mango, MD ag:TT D: 03/27/2014 20:52:45 ET T: 03/27/2014 22:04:54 ET JOB#: [id number]  cc: Nicholes Mango, MD, <Dictator> Leona Carry. Hall Busing, MD Nicholes Mango MD ELECTRONICALLY SIGNED 03/28/2014 18:33

## 2014-08-23 NOTE — Consult Note (Signed)
Brief Consult Note: Diagnosis: depression nos. MDE vs depression due to medical condition stroke and infection.   Patient was seen by consultant.   Consult note dictated.   Recommend further assessment or treatment.   Orders entered.   Comments: Psychiatry: PAtient seen and chart reviewed. PAtient started on abilify 2mg  qhs. See full note. Will follow on Monday if she is here.  Electronic Signatures: Gonzella Lex (MD)  (Signed [phone] 14:14)  Authored: Brief Consult Note   Last Updated: 27-Nov-15 14:14 by Gonzella Lex (MD)

## 2014-08-23 NOTE — Consult Note (Signed)
Chief Complaint:  Subjective/Chief Complaint seen for anemia and heme positive stool.  patient states she is feeling better today.  denies nausea or abdominal pain. no evidence of GI bleeding acutely.   VITAL SIGNS/ANCILLARY NOTES: **Vital Signs.:   29-Mar-15 14:05  Vital Signs Type Routine  Temperature Temperature (F) 97.7  Celsius 36.5  Pulse Pulse 91  Respirations Respirations 19  Systolic BP Systolic BP 335  Diastolic BP (mmHg) Diastolic BP (mmHg) 74  Mean BP 99  Pulse Ox % Pulse Ox % 95  Pulse Ox Activity Level  At rest  Oxygen Delivery Room Air/ 21 %   Brief Assessment:  Cardiac Regular   Respiratory clear BS   Gastrointestinal details normal Soft  Nontender  Nondistended  Bowel sounds normal   Lab Results: Routine Chem:  27-Mar-15 14:05   BUN  21  29-Mar-15 07:58   Glucose, Serum 82  BUN 8  Creatinine (comp) 0.70  Sodium, Serum 139  Potassium, Serum 3.7  Chloride, Serum  109  CO2, Serum 27  Calcium (Total), Serum  8.3  Anion Gap  3  Osmolality (calc) 275  eGFR (African American) >60  eGFR (Non-African American) >60 (eGFR values <37m/min/1.73 m2 may be an indication of chronic kidney disease (CKD). Calculated eGFR is useful in patients with stable renal function. The eGFR calculation will not be reliable in acutely ill patients when serum creatinine is changing rapidly. It is not useful in  patients on dialysis. The eGFR calculation may not be applicable to patients at the low and high extremes of body sizes, pregnant women, and vegetarians.)  Routine Coag:  27-Mar-15 14:05   INR 1.7 (INR reference interval applies to patients on anticoagulant therapy. A single INR therapeutic range for coumarins is not optimal for all indications; however, the suggested range for most indications is 2.0 - 3.0. Exceptions to the INR Reference Range may include: Prosthetic heart valves, acute myocardial infarction, prevention of myocardial infarction, and combinations  of aspirin and anticoagulant. The need for a higher or lower target INR must be assessed individually. Reference: The Pharmacology and Management of the Vitamin K  antagonists: the seventh ACCP Conference on Antithrombotic and Thrombolytic Therapy. CKTGYB.6389Sept:126 (3suppl): [id number] A HCT value >55% may artifactually increase the PT.  In one study,  the increase was an average of 25%. Reference:  "Effect on Routine and Special Coagulation Testing Values of Citrate Anticoagulant Adjustment in Patients with High HCT Values." American Journal of Clinical Pathology 2006;126:400-405.)  Routine Hem:  27-Mar-15 14:05   Hemoglobin (CBC)  7.0  28-Mar-15 05:08   Hemoglobin (CBC)  7.9  29-Mar-15 07:58   WBC (CBC) 4.7  RBC (CBC)  3.19  Hemoglobin (CBC)  7.5  Hematocrit (CBC)  24.1  Platelet Count (CBC) 241  MCV  76  MCH  23.6  MCHC  31.3  RDW  20.2  Neutrophil % 58.4  Lymphocyte % 25.3  Monocyte % 13.0  Eosinophil % 2.8  Basophil % 0.5  Neutrophil # 2.7  Lymphocyte # 1.2  Monocyte # 0.6  Eosinophil # 0.1  Basophil # 0.0 (Result(s) reported on 28 Jul 2013 at 08:17AM.)   Assessment/Plan:  Assessment/Plan:  Assessment 1) anemia, acute on chronic, microcytic.  heme positive but no overt bleeding. hemodynamically stable. coumadin and asa concurrent use.   Plan 1) planning egd for tomorrow.  will need to check plt and pt in am.  I have discussed the risks benefits and complications of egd to include not limited to bleeding  infection perforationa nd sedation and she wishes to proceed.  continue ppi as you are.   Electronic Signatures: Loistine Simas (MD)  (Signed 29-Mar-15 18:12)  Authored: Chief Complaint, VITAL SIGNS/ANCILLARY NOTES, Brief Assessment, Lab Results, Assessment/Plan   Last Updated: 29-Mar-15 18:12 by Loistine Simas (MD)

## 2014-08-23 NOTE — H&P (Signed)
PATIENT NAME:  Michele Matthews, Michele Matthews MR#:  [id number] DATE OF BIRTH:  January 14, 1946  DATE OF ADMISSION:  07/26/2013  PRIMARY CARE PHYSICIAN: Benita Stabile, MD  CHIEF COMPLAINT: Low hemoglobin.   HISTORY OF PRESENT ILLNESS: Michele Matthews is a 69 year old Caucasian female with past medical history of COPD with a history of ongoing tobacco abuse who comes to the Emergency Room after she was called in by Dr. Juanell Fairly office with low hemoglobin. The patient went for her routine blood work and physical a couple of days ago and the patient was found to have a  hemoglobin of 6.8.   She comes to the Emergency Room and hemoglobin is 7.0. The patient has been on warfarin for her severe carotid artery stenosis and history of stroke in the past along with baby aspirin. She has been feeling weak, fatigued, and "cold" for about the past week. The patient is being admitted for further evaluation and management of her anemia. She is consented to receive 1 unit of blood transfusion. Consent was obtained by ER physician.   PAST MEDICAL HISTORY:  1.  COPD with recent history of pneumonia in January 2015 for which she to rehab and was just discharged a few weeks ago back home.  2.  COPD with ongoing tobacco abuse.  3.  History of embolic stroke with left-sided weakness. The patient on Coumadin.  4.  Right internal carotid artery stenosis. The patient on Coumadin.  5.  Hypertension.  6.  Hyperlipidemia.  7.  The patient had upper GI endoscopy in November 2013 done by Dr. Dionne Milo that showed gastritis and hiatus hernia.  8.  Colonoscopy done in November 2013 shows diverticulosis and internal hemorrhoids.   SOCIAL HISTORY: Lives at home with her husband. Smokes about 6 cigarettes a day. Denies alcohol use.   FAMILY HISTORY: Positive for cervical cancer and stroke in family.   REVIEW OF SYSTEMS:  CONSTITUTIONAL: Positive for fatigue, weakness.  EYES: No blurred or double vision. No glaucoma.  ENT: No tinnitus, ear pain, hearing  loss or postnasal drip.  RESPIRATORY: No cough, wheeze, or hemoptysis. Positive for COPD which is stable.  CARDIOVASCULAR: No chest pain, orthopnea, edema or hypertension.  GASTROINTESTINAL: No nausea, vomiting, diarrhea, abdominal pain. Positive for change in bowel habits.  GENITOURINARY: No dysuria, hematuria, or frequency.  ENDOCRINE: No polyuria, nocturia or thyroid problems.  HEMATOLOGY: Positive for anemia and no bleeding or easy bruising.  SKIN: No acne, rash, or lesion.  MUSCULOSKELETAL: Positive for arthritis.  NEUROLOGIC: No CVA or TIA, dysarthria, or dementia. The patient does have some mild weakness on the left secondary to CVA in the past.  PSYCHIATRIC: No anxiety, depression or bipolar disorder.   All other systems reviewed and negative. EKG shows sinus tachycardia.   LABORATORY DATA: Hemoglobin and hematocrit 7 and 23.1. White count is 9.4. Glucose is 111, BUN is 21. The rest of the chemistries are normal. PT, INR is 19.8 and 1.1. Troponin is less than 0.02.   ASSESSMENT: This patient is 69 year old Michele Matthews with history of chronic obstructive pulmonary disease with ongoing tobacco abuse, not on oxygen; history of CVA with left-sided weakness; along with history of coronary artery stenosis on the right, 99% occlusion; with the patient on chronic Coumadin and aspirin therapy who comes into the Emergency Room with:   1.  Acute-on-chronic anemia, suspected slow gastrointestinal bleed in the setting of history of gastritis and the patient being on Coumadin and aspirin. The patient has been off her Nexium since she  was discharged from rehab per patient's husband. Reason unknown. We will admit the patient to the medical floor, give her 1 unit of blood transfusion. Consent was obtained by ER physician. Will start her on proton pump inhibitor IV b.i.d. Hold off Coumadin and aspirin at this time. I spoke with Dr. Vira Agar who will see the patient in consultation. Will consider starting the  patient on some iron pills, ferrous sulfate. Further transfusion according to the patient's blood work. The patient did have faint Hemoccult positive per Dr. Reita Cliche in the Emergency Room.  2.  Chronic obstructive pulmonary disease. Appears stable with ongoing tobacco abuse. Counseled on smoking cessation, about 3 minutes spent. Will continue on her inhalers and nebulizers if needed.  3.  History of cerebrovascular accident, on Coumadin due to 99% occlusion in the right carotid artery with left-sided stroke in the past. She also has a 60% blockage in the left carotid artery and she follows up with yearly ultrasounds at the vascular clinic. Will hold off on Coumadin at this time.  4.  Hypertension. Continue home medications.  5.  Tobacco use disorder. Counseled again smoking cessation for about 3 minutes. The patient states she does use Nicorette gum at home.  6.  Deep vein thrombosis prophylaxis. sequential compression devices and thromboembolic disease stockings. Will hold off on any antiplatelet agents.  7.  Above was discussed with the patient and the patient's husband who was present in the Emergency Room. Dr. Vira Agar is aware of the patient being admitted.   TIME SPENT: 50 minutes.   ____________________________ Hart Rochester Posey Pronto, MD sap:np D: 07/26/2013 17:28:04 ET T: 07/26/2013 19:14:42 ET JOB#: [id number]  cc: Darcelle Herrada A. Posey Pronto, MD, <Dictator> Leona Carry. Hall Busing, MD Ilda Basset MD ELECTRONICALLY SIGNED 08/08/2013 16:24

## 2014-08-23 NOTE — Discharge Summary (Signed)
PATIENT NAME:  Michele Matthews, Michele Matthews MR#:  [id number] DATE OF BIRTH:  03-Oct-1945  DATE OF ADMISSION:  09/08/2013 DATE OF DISCHARGE:  09/13/2013  DISCHARGE DIAGNOSES: 1.  Seizure, status epilepticus.  2.  Urinary tract infection.  3.  Acute respiratory failure.  4.  Encephalopathy.  5.  History of carotid arterial disease with cerebrovascular accident.   PROCEDURE:  Intubation,   CONDITION:  Stable.   CODE STATUS:  FULL CODE.  HOME MEDICATIONS:  Please refer to the medication reconciliation list.   The patient needs home health and physical therapy.   DIET: Low sodium, low fat, low cholesterol.   ACTIVITY:  As tolerated.   FOLLOWUP CARE:  Follow up with PCP within 1 to 2 weeks. Follow up with [facility] neurology within 2 to 4 weeks.   CONSULTATIONS:  Neurologist, Dr. Irish Elders, and Dr. Raul Del.   REASON FOR ADMISSION:  Weakness and confusion.   HOSPITAL COURSE:  The patient is a 69 year old Caucasian female with a history of COPD, tobacco abuse, hypertension, CVA, was sent to ED due to weakness and confusion. The patient was found to have a UTI. For detailed history and physical examination, please refer to the admission note dictated by Dr. Benjie Karvonen. Laboratory data in ED showed urinalysis WBC 21, bacteriuria 3+. The patient's troponin level was 0.02. Electrolytes were normal. BUN 13, creatinine 0.66. Chest x-ray did not show any acute cardiopulmonary disease. CAT scan of head did not show any intracranial hemorrhage.  1.  Acute encephalopathy, possibly due to urinary tract infection. After admission, the patient had been treated with Rocephin.  The patient's mental status improved to her baseline.  2.  Seizure. The patient developed 3 episodes of seizures on the second day after admission. Seizures lasted about 1 to 2 hours, seemed to have tonic-clonic seizures. The seizure is possibly due to UTI  lowering seizure threshold and also has a history of previous CVA. The patient was transferred to CCU and  intubated, placed on ventilation. Dr. Irish Elders evaluated the patient,  suggested Keppra IV q.12 hours. The patient's EEG showed intermittent generalized slowing, nonspecific findings. The patient was extubated on the third day after admission. After extubation, the patient had no seizure activity. The patient's mental status improved to her baseline. She denies any symptoms.   3. Cerebrovascular accident. The patient has a history of CVA, is on Coumadin. Since the patient's  INR was high at 4.6, Coumadin was on hold. We resumed Coumadin yesterday.   The patient has no complaints. Vital signs are stable. Physical examination is unremarkable. According to physical therapy evaluation, the patient needs skilled facility placement; however, the patient and the patient's husband and daughter refused skilled nursing facility. They want the patient to be discharged home with home health. The patient is clinically stable and will be discharged to home with home health and PT. The patient then needs to continue Keppra 1 gram b.i.d. for 6 months to 1 year, according to Dr. Irish Elders.   I discussed the patient's discharge plan with the patient, nurse, and case manager.   TIME SPENT: About 38 minutes.   ____________________________ Demetrios Loll, MD qc:dmm D: 09/13/2013 12:15:01 ET T: 09/13/2013 13:17:24 ET JOB#: [id number]  cc: Demetrios Loll, MD, <Dictator> Demetrios Loll MD ELECTRONICALLY SIGNED 09/13/2013 14:56

## 2014-08-23 NOTE — H&P (Signed)
PATIENT NAME:  Michele Matthews, Michele Matthews MR#:  [id number] DATE OF BIRTH:  1946/03/02  DATE OF ADMISSION:  09/08/2013  ADDENDUM:  I was able to counsel the patient regarding tobacco dependence. The patient smokes 6 to 7 cigarettes a day. She is not interested in quitting smoking at this time. She does not want a nicotine patch. The patient was counseled for 3-1/2 minutes.     ____________________________ Donell Beers. Benjie Karvonen, MD spm:lt D: 09/08/2013 17:35:07 ET T: 09/08/2013 21:42:59 ET JOB#: [id number]  cc: Loria Lacina P. Benjie Karvonen, MD, <Dictator> Donell Beers Taffie Eckmann MD ELECTRONICALLY SIGNED 09/13/2013 18:14

## 2014-08-23 NOTE — Consult Note (Signed)
PATIENT NAME:  Michele Matthews, BREED MR#:  [id number] DATE OF BIRTH:  March 10, 1946  DATE OF CONSULTATION:  07/27/2013  REFERRING PHYSICIAN:   CONSULTING PHYSICIAN:  Lollie Sails, MD  Patient of Dr. Tressia Miners.  REASON FOR CONSULTATION:  Anemia, heme-positive stool.   HISTORY OF PRESENT ILLNESS:  Michele Matthews is a 69 year old Caucasian female who states that she saw her primary physician last week and had some routine labs done.  She was notified that she was anemic, in fact fairly substantially anemic with a hemoglobin of about 6.8.  She was then advised to go to the hospital.  Subsequently she has been admitted for further evaluation.  She denies any nausea, vomiting, or abdominal pain.  There is no dysphagia.  She has some occasional heartburn.  She was taking Nexium on a regular basis based on finding of gastritis several years ago; however, was told to discontinue this and she stopped about three weeks ago.  She generally has a daily bowel movement.  She denies any black stool, blood in the stool, or slimy stools.  She denies the use of NSAIDs with the exception of an 81 mg aspirin.  However, she also takes Coumadin.  She does have a history of gastroesophageal reflux.  She has been hemodynamically stable.  She states she was taking some aspirin products this past December, but denies any for the past couple of months.   GASTROINTESTINAL FAMILY HISTORY:  Negative for colorectal cancer, liver disease or ulcers.   PAST MEDICAL HISTORY:  1.  She has a history of COPD and hospitalization in January of 2015 for pneumonia.   2.  Ongoing tobacco use/abuse.  3.  History of embolic stroke and some residual left-sided weakness for which the patient has been on Coumadin.  4.  Right internal carotid artery stenosis.  5.  Hypertension.  6.  Hyperlipidemia.  7.  She has been transfused 1 unit of blood with appropriate response.   The patient herself has had an EGD and colonoscopy done 03/28/2012 for  pre-diagnosis of iron deficiency anemia.  On the upper scope, she was found to have a hiatal hernia as well as some gastritis that was Helicobacter pylori negative.  She was continued on a proton pump inhibitor.  Her lower scope showed some internal hemorrhoids, diverticulosis and a single polyp that being hyperplastic in nature.   SOCIAL HISTORY:  She continues to smoke at least six cigarettes a day and she does not use alcohol.  She lives with her husband.   FAMILY HISTORY:  As noted.  REVIEW OF SYSTEMS:  Ten systems reviewed per admission history and physical, negative with the exception of above as well as a mild weight loss of about 5 to 6 pounds over the past couple of months.   PHYSICAL EXAMINATION: VITAL SIGNS:  Temperature 98.8, pulse 103, respirations 20, blood pressure 101/61, pulse ox 95%.  GENERAL:  She is an elderly-appearing 69 year old Caucasian female no acute distress.  HEENT:  Normocephalic, atraumatic.  EYES:  Anicteric.  NOSE:  Septum midline.  OROPHARYNX:  Very poor dentition.  NECK:  No JVD.  HEART:  Regular rate and rhythm.  LUNGS:  Clear, occasional coarse rhonchi.  ABDOMEN:  Soft, nontender, nondistended.  Bowel sounds positive, normoactive.  RECTAL:  Anorectal exam deferred as she was apparently trace Hemoccult positive in the Emergency Room.  EXTREMITIES:  No clubbing, cyanosis, or edema.  NEUROLOGICAL:  Cranial nerves II through XII grossly intact.  Muscle strength bilaterally equal and symmetric.  DTRs bilaterally equal and symmetric.   LABORATORY, DIAGNOSTIC, AND RADIOLOGICAL DATA:  Include the following:  On admission to the hospital, she had a glucose of 111, BUN 21, creatinine 0.86, sodium 137, potassium 4.3, chloride 106, bicarb 25, calcium 9.0.  Normal liver enzymes.  Hemogram showing a white count of 9.4, hemoglobin and hematocrit 7.0/23.1, platelet count of 282.  MCV is 74.  Repeat today after 1 unit of blood showed a hemoglobin of 7.9.  Her INR late  yesterday on admission was 1.7 with a Pro Time of 19.8.  She has had no imaging studies.   ASSESSMENT:  Anemia, Hemoccult positive stool in the setting of daily mini dose aspirin and Coumadin use.  She has a history of gastritis as well as hiatal hernia in the past.  Differential diagnosis would include recurrent gastritis, chronic in nature as well as Lysbeth Galas type erosions from the large hiatal hernia.  She did have a recent colonoscopy that was negative for other than a single diminutive hyperplastic polyp.   RECOMMENDATION: 1.  Continue intravenous proton pump inhibitor as you are.  2.  We will plan for esophagogastroduodenoscopy for Monday.  I have discussed the risks, benefits, and complications of esophagogastroduodenoscopy to include, but not limited to bleeding, infection, perforation, and the risk of sedation and she wishes to proceed.    ____________________________ Lollie Sails, MD mus:ea D: 07/27/2013 16:56:38 ET T: 07/27/2013 17:23:22 ET JOB#: [id number]  cc: Lollie Sails, MD, <Dictator> Lollie Sails MD ELECTRONICALLY SIGNED 07/30/2013 13:31

## 2014-08-23 NOTE — Discharge Summary (Signed)
PATIENT NAME:  Michele Matthews, Michele Matthews MR#:  [id number] DATE OF BIRTH:  [birth year]  ADMITTING DIAGNOSIS: Altered mental status.   DISCHARGE DIAGNOSES: 1.  Acute encephalopathy due to urinary tract infection; mental status back to baseline.  2.  Multiple cerebrovascular accidents in the past. CT scan negative for cerebrovascular accident.  3.  Hypertension.  4.  Chronic obstructive pulmonary disease without evidence of acute exacerbation.  5.  History of seizures.  6.  Coronary artery disease.  7.  Nicotine addiction. The patient counseled regarding smoking cessation.  8.  Depression, seen by psychiatry.  CONSULTANTS: Gonzella Lex, MD  PERTINENT LABORATORY DATA:  Admitting glucose 107, BUN 26, creatinine 0.86, sodium 141, potassium 4.1, chloride 110, CO2 of 26, calcium 8.8. LFTs are normal. Troponin less than 0.02. WBC 6.9, hemoglobin 12.1, platelet count 175,00, INR 1.5, on discharge is 2.0. Urine cultures greater than 100,000 Escherichia coli pansensitive. UA showed leukocytes 3+, bacteria 3+, WBCs 42.   HOSPITAL COURSE: The patient is a 69 year old white female with previous history of CVA in the past,  brought in by son because of altered mental status and not acting herself for the past 3 days. The patient was noted to have a significant UTI. She was admitted to the hospital. The patient's mental status gradually improved and is back to baseline after treatment of the UTI. She was also complaining of some neurological symptoms, had a CT scan of the head, which was negative. She is on chronic Coumadin. Her INR was subtherapeutic; therefore, her Coumadin dose was adjusted. The patient was also complaining of feeling very depressed. She was seen by psychiatry and was started on Abilify.  The patient at this point is doing much better and is stable for discharge.   DISCHARGE MEDICATIONS: Lorazepam 1 mg q. 6 p.r.n. for anxiety, atenolol 50 one tab p.o. b.i.d., lisinopril 20 mg 1 tab p.o. daily, Tylenol PM  500 two tabs at bedtime, aspirin 81 mg 1 tab p.o. daily, nicotine 21 mg transdermally daily, atorvastatin 40 at bedtime, Keppra 1000 one tab p.o. b.i.d., Protonix 40 daily, sertraline 100 one tab p.o. b.i.d., Tylenol 650 at bedtime as needed, Coumadin 6 mg daily, alprazolam 2 mg at bedtime, Cipro 500 mg 1 tab q. 12 hours x 4 days.   DIET: Low sodium, low fat, low cholesterol.   ACTIVITY: As tolerated.   FOLLOWUP: With primary MD in 1-2 weeks.   TIME SPENT: 35 minutes on this patient.     ____________________________ Lafonda Mosses. Posey Pronto, MD shp:LT D: 03/30/2014 12:53:10 ET T: 03/30/2014 20:35:40 ET JOB#: [id number]  cc: Stacy Sailer H. Posey Pronto, MD, <Dictator> Alric Seton MD ELECTRONICALLY SIGNED 04/05/2014 8:37

## 2014-08-23 NOTE — Consult Note (Signed)
Brief Consult Note: Diagnosis: anemia, heme positive stool.   Patient was seen by consultant.   Consult note dictated.   Recommend further assessment or treatment.   Comments: please see full GI consult.   patient anditted with anemia, found with trace positive stool.  hemodynamically stable. Continue ppi, will plan for egd on monday.  I have discussed the risks benefits and complications of egd to include not limited to bleeding infection perforation and sedation and she wishes to proceed.  Following.  Electronic Signatures: Loistine Simas (MD)  (Signed 28-Mar-15 16:59)  Authored: Brief Consult Note   Last Updated: 28-Mar-15 16:59 by Loistine Simas (MD)

## 2014-08-23 NOTE — H&P (Signed)
PATIENT NAME:  Michele Matthews, Michele Matthews MR#:  [id number] DATE OF BIRTH:  July 27, 1945  DATE OF ADMISSION:  05/14/2013  ADMITTING PHYSICIAN: Gladstone Lighter, MD  PRIMARY CARE PHYSICIAN: Leona Carry. Hall Busing, MD  CHIEF COMPLAINT: Cough, weakness, nausea, vomiting, diarrhea.   HISTORY OF PRESENT ILLNESS: Michele Matthews is a 69 year old female with past medical history significant for history of smoking and COPD, not on home oxygen; history of CVA with left-sided weakness; history of carotid artery disease, on Coumadin for the same; hypertension. Presents from home with the above-mentioned complaints. The patient is extremely weak and also tachypneic on talking, so most of the history is obtained from husband at bedside. According to him, the patient has been sick for almost the last 2 weeks. Started with nausea, vomiting, and also diarrhea. She is now having at least 2 to 3 loose watery bowel movements per day. Denies any blood in the stools, but she has been having worsening of her cough than baseline, productive with yellowish-greenish sputum. Also flecks of blood noted, especially since she is on Coumadin. She has been having fevers and subjective chills at home. No fever recorded in the ED. She also has decreased appetite secondary to her nausea, unable to keep anything down, and came to the hospital. Here, she was having diffuse wheezing and difficulty breathing initially, improved with neb treatments and antibiotics. Also noted to have an elevated white count of 20,000.   PAST MEDICAL HISTORY: 1.  Hypertension. 2.  History of CVA with mild left-sided weakness.  3.  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  4.  COPD, not on any home oxygen.   PAST SURGICAL HISTORY: Hysterectomy.   ALLERGIES TO MEDICATIONS: No known drug allergies.   CURRENT HOME MEDICATIONS:  1.  Atenolol 50 mg p.o. b.i.d.  2.  Atorvastatin 10 mg p.o. daily.  3.  Lisinopril 20 mg p.o. daily.  4.  Lorazepam 1 mg p.o. q.6  hours p.r.n.   5.  Nexium 40 mg p.o. daily.  6.  Sertraline 200 mg p.o. daily.  7.  Warfarin 5 mg p.o. daily.   SOCIAL HISTORY: Lives at home with her husband. Continues to smoke, but less than 1/2 pack per day recently. No alcohol abuse.   FAMILY HISTORY: Significant for cervical cancer and stroke in the family.   REVIEW OF SYSTEMS:    CONSTITUTIONAL: Positive for fever and fatigue and weakness.  EYES: Positive for blurry vision. Uses reading glasses. No glaucoma or cataracts.  ENT: No tinnitus, ear pain, or discharge or dysphagia. Positive for bilateral mild hearing loss.  RESPIRATORY:  Positive for cough, wheeze, hemoptysis, and COPD.  CARDIOVASCULAR: No chest pain. No orthopnea. Positive for dyspnea on exertion. No palpitations, arrhythmias or syncope.  GASTROINTESTINAL: Positive for nausea, vomiting, and diarrhea. No abdominal pain, hematemesis or melena.  GENITOURINARY: No dysuria, hematuria, renal calculus, frequency or incontinence.  ENDOCRINE: No polyuria, nocturia, thyroid problems, heat or cold intolerance.  HEMATOLOGIC: No anemia, easy bruising or bleeding.  SKIN: No acne, rash or lesions.  MUSCULOSKELETAL: No neck, back, shoulder pain, arthritis or gout.  NEUROLOGICAL: History of stroke present with left-sided weakness. No vertigo, ataxia, or tingling or numbness. PSYCHIATRIC: No anxiety, insomnia or depression.   PHYSICAL EXAMINATION: VITAL SIGNS: Temperature 97.6 degrees Fahrenheit, pulse 93, respirations 22, blood pressure 133/69, pulse oximetry 92% on room air.  GENERAL: Well-built, well-nourished female lying in bed, not in any acute distress. She appears older to her age.  HEENT: Normocephalic, atraumatic. Pupils  equal, round, reacting to light. Anicteric sclerae. Extraocular movements intact. Oropharynx is clear without erythema, mass or exudates.  NECK: Supple. No thyromegaly, JVD or carotid bruits. No lymphadenopathy.  LUNGS: Moving air bilaterally. Scant breath  sounds are heard. Minimal exertion causes increased work of breathing. No crackles heard. Some coarse rhonchi scattered bilaterally, especially more prominent on the posterior side.  CARDIOVASCULAR: S1, S2, regular rate and rhythm, III/VI loud systolic murmur heard. No rubs or gallops.  ABDOMEN: Soft, nontender, nondistended. No hepatosplenomegaly. Normal bowel sounds.  EXTREMITIES: No pedal edema. No clubbing or cyanosis. Feeble dorsalis pedis pulses palpable bilaterally.  SKIN: No acne, rash or lesions.  LYMPHATICS: No cervical lymphadenopathy.  NEUROLOGICAL: Cranial nerves seem to be intact. Motor strength is 4/5 in the right lower extremity and 3/5 in the left lower with worsened drift against gravity, and upper extremity motor strength is 4/4 in both upper extremities. Sensation is intact to touch, but decreased to pins.  PSYCHOLOGIC: The patient is awake, alert, oriented x 3.   LABORATORY AND RADIOLOGICAL DATA: WBC 20.5, hemoglobin 11.6, hematocrit 35.2, platelet count 337. Sodium 134, potassium 3.1, chloride 100, bicarbonate 27, BUN 10, creatinine 0.82, glucose 122, and calcium of 8.6. CK 78, CK-MB 0.9, troponin negative. BNP is elevated at 2432. Chest x-ray revealing no segmental infiltrates or edema. Increased interstitial markings suspicious for interstitial pneumonitis and small hiatal hernia is present.   ASSESSMENT AND PLAN: This is a 69 year old female with history of hypertension, history of prior stroke with left-sided weakness, chronic obstructive pulmonary disease, coming in for dyspnea, tachypnea, and productive cough and sputum. 1.  Systemic inflammatory response syndrome secondary to pneumonitis and bronchitis: Blood and sputum cultures have been ordered. Will start on empiric Rocephin and azithromycin. Urinalysis is pending. This is also causing extreme weakness at home, so we will get a physical therapy consult to see if she would need home health at the time of discharge.  2.   Chronic obstructive pulmonary disease: Appears to be stable. Continue inhalers and nebulizer treatments. Will start on a p.o. prednisone taper for mild scattered wheezing and decreased breath sounds. 3.  Nausea, vomiting, diarrhea: Could be viral, as going on for almost 2 weeks. Anyways Stool studies are being sent for c.diff and cultures. She will be on contact isolation until continue Clostridium difficile test is back. Continue supportive management with IV fluids.  4.  Cerebrovascular accident: On Coumadin due to 99% occlusion in the right coronary artery with left-sided stroke in the past. She has 60% blockage in the left carotid artery and follows up with yearly ultrasounds at the vascular clinic. She is on Coumadin for that at this time, and she was deemed to be high risk for surgery in the past. She is wheelchair-bound and also uses a walker at baseline. Will order physical therapy consult.  5.  Hypertension: Continue home medications.  6.  Tobacco use disorder: Counseled against smoking for 3 minutes. Been started on Nicorette gum.   CODE STATUS: Full code.   TIME SPENT ON ADMISSION: 50 minutes.   ____________________________ Gladstone Lighter, MD rk:jcm D: 05/14/2013 14:04:18 ET T: 05/14/2013 14:38:45 ET JOB#: [id number]  cc: Gladstone Lighter, MD, <Dictator> Leona Carry. Hall Busing, MD Gladstone Lighter MD ELECTRONICALLY SIGNED 05/14/2013 18:26

## 2014-08-31 NOTE — Consult Note (Signed)
PATIENT NAME:  Michele Matthews, Michele Matthews MR#:  [id number] DATE OF BIRTH:  19-Mar-1946  DATE OF CONSULTATION:  07/02/2014  CONSULTING PHYSICIAN:  Cheral Marker. Ola Spurr, MD  REQUESTING PHYSICIAN: Srikar R. Sudini, MD   REASON FOR CONSULTATION:  Ramsey Hunt syndrome.   HISTORY OF PRESENT ILLNESS: A 69 year old female with a history of prior CVA with residual left-sided weakness, hypertension, COPD, and carotid stenosis, who came to the Emergency Room with right-sided facial droop of 1 day's duration. She developed a rash on her right neck several days prior and had had some ear pain prior to that. Since admission it was felt she likely had zoster. She has been isolated and started on acyclovir. She continues with the facial droop and mild headache. She has had a CT and MRI that are negative for any CVA. She has undergone a lumbar puncture.   PAST MEDICAL HISTORY: Cerebrovascular accident with residual left-sided weakness, coronary artery disease, COPD, hypertension, peripheral vascular disease, and carotid stenosis.   PAST SURGICAL HISTORY: Noncontributory.   SOCIAL HISTORY: Positive for tobacco use. No alcohol or drug use.   FAMILY HISTORY: Positive for cerebrovascular accident.   ALLERGIES: No known drug allergies.   MEDICATIONS:  Antibiotics since admission include acyclovir. She has also been started on prednisone 60 mg daily.   REVIEW OF SYSTEMS:  Eleven systems reviewed and negative except as per HPI.   PHYSICAL EXAMINATION:  VITAL SIGNS: Temperature 98.4, pulse 77, blood pressure 118/60, respirations 18, saturation 93%.  GENERAL: She is pleasant, interactive, in no acute distress.  HEENT: Pupils are reactive. She has obvious right facial droop. She has scab lesions on her right neck up to her ear.  NECK: Supple.  HEART: Regular.  LUNGS: Clear.  ABDOMEN: Soft, nontender, nondistended.  EXTREMITIES: 1+ edema.  NEUROLOGIC: She is alert and oriented x3. Cranial nerve 7 defect as above. Hearing is  intact.   LABORATORY DATA: White blood count 7.8, hemoglobin 10.4, platelets 367,000. Renal function is normal. LFTs are normal. INR is normal. CSF revealed 14 white cells, 92% lymphocytes, protein slightly elevated at 59, glucose 45. Other studies pending.   IMAGING:  MRI of the brain revealed no acute intracranial abnormality. There is chronic right MCA and left PCA territory infarcts. Chest x-ray showed no active cardiopulmonary disease.   IMPRESSION: A 69 year old female with multiple medical problems, admitted with right-sided neck and ear zoster as well as right-sided facial droop. She has Ramsey Hunt syndrome. Cerebrospinal fluid does have a few white cells with lymphocytic predominance. MRI is negative for any enhancement or cortical abnormalities. Clinically, she is not confused. She has not had any fevers. Her white count is normal.   RECOMMENDATIONS: Continue acyclovir and prednisone. When stable for discharge can change to Valtrex 1000 mg t.i.d. for a total of 10 to 14 days, as well as a prednisone taper over 10 to 14 days.   Thank you for the consult. I will be glad to follow with you.    ____________________________ Cheral Marker. Ola Spurr, MD dpf:at D: 07/02/2014 15:34:06 ET T: 07/02/2014 18:25:16 ET JOB#: [id number]  cc: Cheral Marker. Ola Spurr, MD, <Dictator> Lazariah Savard Ola Spurr MD ELECTRONICALLY SIGNED 07/03/2014 20:03

## 2014-08-31 NOTE — Consult Note (Signed)
Referring Physician:  Lytle Butte   Primary Care Physician:  [facility], [address], [place], [place] [address], [place] [phone]  Reason for Consult: Admit Date: 02-Jul-2014  Chief Complaint: R facial droop  Reason for Consult: CVA   History of Present Illness: History of Present Illness:   seen at request of Dr. Bridgett Larsson secondary to R facial droop;  69 yo RHD F presents to [facility] secondary to R facial droop and R sided pain.  About 2 weeks ago, pt had an ear infection on the R which was severe then about 4 days after that she noted lesions along her R neck.  Yesterday she noted that she had a R facial droop and severe lancing pain on the R face.  Hearing is still poor on R.  She denies any new extremity weakness.  ROS:  General pain   HEENT R hearing loss   Lungs no complaints   Cardiac no complaints   GI no complaints   GU no complaints   Musculoskeletal no complaints   Extremities no complaints   Skin no complaints   Neuro headache  numbness/tingling   Endocrine no complaints   Psych no complaints   Past Medical/Surgical Hx:  tobacco use: per chart notes  CAD: R artery occlusion per chart notes  CVA/Stroke:   Anxiety:   copd:   Bronchitis:   Hypertension:   Past Medical/ Surgical Hx:  Past Medical History reviewed by me as above   Past Surgical History reviewed by me as above   Home Medications: Medication Instructions Last Modified Date/Time  Coumadin 6 mg oral tablet 1 tab(s) orally 2 times a week (Thursday and Sunday) 01-Mar-16 21:27  LORazepam 1 mg oral tablet 1 tab(s) orally every 6 hours, As Needed - for Anxiety 01-Mar-16 21:27  atenolol 50 mg oral tablet 1 tab(s) orally 2 times a day 01-Mar-16 21:27  lisinopril 20 mg oral tablet 1 tab(s) orally once a day, As Needed for blood pressure greater than 150.  01-Mar-16 21:27  Tylenol PM 500 mg-25 mg oral tablet 2 tab(s) orally once a day (at bedtime), As Needed - for  Inability to Sleep 01-Mar-16 21:27  aspirin 81 mg oral delayed release tablet 1 tab(s) orally once a day 01-Mar-16 21:27  atorvastatin 40 mg oral tablet 1 tab(s) orally once a day (at bedtime) 01-Mar-16 21:27  levETIRAcetam 1000 mg oral tablet 1 tab(s) orally 2 times a day 01-Mar-16 21:27  sertraline 100 mg oral tablet 1 tab(s) orally 2 times a day 01-Mar-16 21:27  Tylenol 325 mg oral tablet 2 tab(s) orally once a day (at bedtime), As Needed - for Pain 01-Mar-16 21:27  Coumadin 6 mg oral tablet 0.5 tab(s) orally 5 times a week (Monday, Wednesday, Thursday, Friday and Saturday) 01-Mar-16 21:27   Allergies:  No Known Allergies:   Allergies:  Allergies NKDA   Social/Family History: Employment Status: retired  Lives With: children  Living Arrangements: house  Social History: no tob, no EtOH, no illicits  Family History: no seizures, no strokes   Vital Signs: **Vital Signs.:   02-Mar-16 09:01  Vital Signs Type Q 8hr  Temperature Temperature (F) 97.9  Celsius 36.6  Temperature Source oral  Pulse Pulse 77  Respirations Respirations 18  Systolic BP Systolic BP 784  Diastolic BP (mmHg) Diastolic BP (mmHg) 68  Mean BP 84  Pulse Ox % Pulse Ox % 93  Pulse Ox Activity Level  At rest  Oxygen Delivery  Room Air/ 21 %   Physical Exam: General: nl weight, mild distress  HEENT: normocephalic, sclera nonicteric, oropharynx clear;  rash along occipital region on R  Neck: supple, no JVD, no bruits  Chest: CTA B, no wheezing  Cardiac: RRR, no murmurs, no edema, 2+ pulses  Extremities: no C/C/E, FROM   Neurologic Exam: Mental Status: alert and oriented x 3, normal language, follows complex commands, moderate dysarthria  Cranial Nerves: PERRLa, EOMI, nl VF, R droop, tongue midline, decreased hearing on R  Motor Exam: 4/5 L, 5 /5 R, nl tone  Deep Tendon Reflexes: 1+/4 B, plantars downgoing B, no Hoffman  Sensory Exam: pinprick, temperature, and vibration intact B  Coordination: FTN and HTS  WNL on R, limited on L due to weakness   Lab Results: Hepatic:  01-Mar-16 17:27   Bilirubin, Total 0.2  Alkaline Phosphatase 92  SGPT (ALT)  10  SGOT (AST) 24  Total Protein, Serum 7.6  Albumin, Serum 3.6  Routine Chem:  02-Mar-16 06:50   Cholesterol, Serum 152  Triglycerides, Serum 124  HDL (INHOUSE) 57  VLDL Cholesterol Calculated 25  LDL Cholesterol Calculated 70 (Result(s) reported on 02 Jul 2014 at 07:21AM.)  Glucose, Serum 85  BUN 13  Creatinine (comp) 0.74  Sodium, Serum 140  Potassium, Serum 4.2  Chloride, Serum 106  CO2, Serum 27  Calcium (Total), Serum 8.7  Anion Gap 7  Osmolality (calc) 279  eGFR (African American) >60  eGFR (Non-African American) >60 (eGFR values <24m/min/1.73 m2 may be an indication of chronic kidney disease (CKD). Calculated eGFR, using the MRDR Study equation, is useful in  patients with stable renal function. The eGFR calculation will not be reliable in acutely ill patients when serum creatinine is changing rapidly. It is not useful in patients on dialysis. The eGFR calculation may not be applicable to patients at the low and high extremes of body sizes, pregnant women, and vegetarians.)  CSF Analysis:  02-Mar-16 11:46   Protein, CSF  59  Glucose, CSF 45  Routine Coag:  02-Mar-16 08:47   Prothrombin  17.1 (11.4-15.0 NOTE: New Reference Range  05/30/14)  INR 1.4 (INR reference interval applies to patients on anticoagulant therapy. A single INR therapeutic range for coumarins is not optimal for all indications; however, the suggested range for most indications is 2.0 - 3.0. Exceptions to the INR Reference Range may include: Prosthetic heart valves, acute myocardial infarction, prevention of myocardial infarction, and combinations of aspirin and anticoagulant. The need for a higher or lower target INR must be assessed individually. Reference: The Pharmacology and Management of the Vitamin K  antagonists: the seventh ACCP  Conference on Antithrombotic and Thrombolytic Therapy. CCNOBS.9628Sept:126 (3suppl): [id number] A HCT value >55% may artifactually increase the PT.  In one study,  the increase was an average of 25%. Reference:  "Effect on Routine and Special Coagulation Testing Values of Citrate Anticoagulant Adjustment in Patients with High HCT Values." American Journal of Clinical Pathology 2006;126:400-405.)  Routine Hem:  01-Mar-16 17:27   Erythrocyte Sed Rate  39 (Result(s) reported on 01 Jul 2014 at 10:28PM.)  02-Mar-16 06:50   WBC (CBC) 5.8  RBC (CBC)  3.59  Hemoglobin (CBC)  9.8  Hematocrit (CBC)  30.8  Platelet Count (CBC) 285  MCV 86  MCH 27.3  MCHC  31.8  RDW 14.4  Neutrophil % 49.6  Lymphocyte % 36.1  Monocyte % 10.2  Eosinophil % 3.4  Basophil % 0.7  Neutrophil # 2.9  Lymphocyte # 2.1  Monocyte # 0.6  Eosinophil # 0.2  Basophil # 0.0 (Result(s) reported on 02 Jul 2014 at [facility].)   Radiology Results: CT:    01-Mar-16 17:48, CT Head Without Contrast  CT Head Without Contrast   REASON FOR EXAM:    weakness  COMMENTS:   May transport without cardiac monitor    PROCEDURE: CT  - CT HEAD WITHOUT CONTRAST  - Jul 01 2014  5:48PM     CLINICAL DATA:  Patient hit had during fall 4 days prior. Slurred  speech and left-sided weakness with left facial droop for 4 days    EXAM:  CT HEAD WITHOUT CONTRAST    TECHNIQUE:  Contiguous axial images were obtained from the base of the skull  through the vertex without intravenous contrast.  COMPARISON:  March 29, 2014    FINDINGS:  Mild diffuse atrophy is stable. There is no intracranial mass,  hemorrhage, extra-axial fluid collection, or midline shift. There is  evidence of a prior infarct at the right temporoparietal junction  superiorly as well as at the right superior temporal -frontal  junction. This prior infarct extends superiorly to involve portions  of the right lateral parietal lobe and much of the right  centrum  semiovale. There is also a prior infarct in the superior, medial  aspect the left occipital lobe. There is mild small vessel disease  in the centra semiovale on the left. There is no new gray-white  compartment lesion. No acute infarct apparent. Bony calvarium  appears intact. The visualized mastoid air cells are clear.   IMPRESSION:  Atrophy with prior infarcts and supratentorial small vessel disease.  There is no hemorrhage or mass effect. No acute appearing infarct.  No extra-axial fluid collections.      Electronically Signed    By: Lowella Grip III M.D.    On: 07/01/2014 18:27         Verified By: Leafy Kindle. 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-  stable add prednisone 66m daily x 5 days then decrease 161mdaily until off continue Acyclovir 1054mg IV q8h x 14 days agree with Neurotin 300m44mD CSF pending pt can likely go home if IV antibiotics can be arranged as outpatient will follow briefly  Electronic Signatures: SmitJamison Neighbor)  (Signed 02-Mar-16 13:28)  Authored: REFERRING PHYSICIAN, Primary Care Physician, Consult, History of Present Illness, Review of Systems, PAST MEDICAL/SURGICAL HISTORY, HOME MEDICATIONS, ALLERGIES, Social/Family History, NURSING VITAL SIGNS, Physical Exam-, LAB RESULTS, RADIOLOGY RESULTS, Recommendations   Last Updated: 02-Mar-16 13:28 by SmitJamison Neighbor)

## 2014-08-31 NOTE — Discharge Summary (Signed)
Dates of Admission and Diagnosis:  Date of Admission 01-Jul-2014   Date of Discharge 04-Jul-2014   Admitting Diagnosis Shingles   Final Diagnosis 1. Ramsey hunt syndrome - Right 2. h/o Cerebrovascular accident 3. chronic obstructive pulmonary disease 4. hypertension    Chief Complaint/History of Present Illness CHIEF COMPLAINT: Right-sided weakness.   HISTORY OF PRESENT ILLNESS: A 69 year old Caucasian female with a history of CVA, residual left-sided weakness; hypertension, essential; COPD, non-oxygen dependent; as well as known carotid stenosis, presenting with right-sided weakness. She describes 1 day duration of slurred speech with associated right facial droop and hand paresthesias. Also, denotes having a rash 4 days in total duration, originally started as blisters over the right upper chest and right neck; however, those blisters have resolved, now has crusted lesions with associated pain, described as burning/pain in quality, 6-7/10 intensity, no worsening or relieving factors. Presented to the hospital for further workup and evaluation of above symptomatology. No further symptoms.   Allergies:  No Known Allergies:   Pertinent Past History:  Pertinent Past History PAST MEDICAL HISTORY: Includes CVA with residual left-sided weakness; coronary artery disease; COPD, non-oxygen dependent; hypertension, essential; peripheral vascular disease and known carotid stenosis.   Hospital Course:  Hospital Course 27 f with chronic obstructive pulmonary disease, hypertension, h/o Cerebrovascular accident here for right facial droop and pain  * Ramsey hunt syndrome Shingles, hearing loss, right facial droop and pain On Acyclovir. LP has only 17 WBC. WIll need 14 days Valtrex and prednisone taper. Right eye patch.  * Cerebrovascular accident Only old infarcts on MRI ASA, Statin  * Chronic obstructive pulmonary disease Continue inhalers  * hypertension  * Weakness  Time spent on  discharge 40 minutes   Condition on Discharge Fair   Code Status:  Code Status Full Code   PHYSICAL EXAM ON DISCHARGE:  Physical Exam:  GEN no acute distress, thin   HEENT pale conjunctivae, dry oral mucosa   NECK supple  No masses  thyroid not tender   RESP normal resp effort  clear BS   CARD regular rate  murmur present  No LE edema   VASCULAR ACCESS -- Purulent drainage   ABD denies tenderness  no liver/spleen enlargement  soft  normal BS   LYMPH negative neck   EXTR negative cyanosis/clubbing, negative edema   SKIN normal to palpation   NEURO negative rigidity, negative tremor, Left 4/5, Right 5/5   PSYCH alert   DISCHARGE INSTRUCTIONS HOME MEDS:  Medication Reconciliation: Patient's Home Medications at Discharge:     Medication Instructions  lorazepam 1 mg oral tablet  1 tab(s) orally every 6 hours, As Needed - for Anxiety   atenolol 50 mg oral tablet  1 tab(s) orally 2 times a day   lisinopril 20 mg oral tablet  1 tab(s) orally once a day, As Needed for blood pressure greater than 150.    atorvastatin 40 mg oral tablet  1 tab(s) orally once a day (at bedtime)   levetiracetam 1000 mg oral tablet  1 tab(s) orally 2 times a day   sertraline 100 mg oral tablet  1 tab(s) orally 2 times a day   tylenol 325 mg oral tablet  2 tab(s) orally once a day (at bedtime), As Needed - for Pain   coumadin 6 mg oral tablet  0.5 tab(s) orally 5 times a week (Monday, Wednesday, Thursday, Friday and Saturday)   coumadin 6 mg oral tablet  1 tab(s) orally 2 times a week (Thursday and Sunday)  valacyclovir 1 g oral tablet  1 tab(s) orally 3 times a day   gabapentin 300 mg oral capsule  1 cap(s) orally 3 times a day   prednisone 10 mg oral tablet  6 tab(s) orally once a day for 4 days and then taper by 1 tab a day till over    STOP TAKING THE FOLLOWING MEDICATION(S):    tylenol pm 500 mg-25 mg oral tablet: 2 tab(s) orally once a day (at bedtime), As Needed - for Inability to  Sleep aspirin 81 mg oral delayed release tablet: 1 tab(s) orally once a day  Physician's Instructions:  Diet Low Sodium   Activity Limitations As tolerated   Return to Work Not Applicable   Time frame for Follow Up Appointment 1-2 weeks  primary care provider   Electronic Signatures: Jaziyah Gradel, Lottie Dawson (MD)  (Signed 04-Mar-16 15:12)  Authored: ADMISSION DATE AND DIAGNOSIS, CHIEF COMPLAINT/HPI, Allergies, PERTINENT PAST HISTORY, HOSPITAL COURSE, PHYSICAL EXAM ON DISCHARGE, DISCHARGE INSTRUCTIONS HOME MEDS, PATIENT INSTRUCTIONS   Last Updated: 04-Mar-16 15:12 by Alba Destine (MD)

## 2014-08-31 NOTE — H&P (Signed)
PATIENT NAME:  Michele Matthews, Michele Matthews MR#:  [id number] DATE OF BIRTH:  October 12, 1945  DATE OF ADMISSION:  07/01/2014  REFERRING PHYSICIAN: Sheryl L. Benjaman Lobe, MD  PRIMARY CARE PHYSICIAN: Leona Carry. Hall Busing, MD  CHIEF COMPLAINT: Right-sided weakness.   HISTORY OF PRESENT ILLNESS: A 69 year old Caucasian female with a history of CVA, residual left-sided weakness; hypertension, essential; COPD, non-oxygen dependent; as well as known carotid stenosis, presenting with right-sided weakness. She describes 1 day duration of slurred speech with associated right facial droop and hand paresthesias. Also, denotes having a rash 4 days in total duration, originally started as blisters over the right upper chest and right neck; however, those blisters have resolved, now has crusted lesions with associated pain, described as burning/pain in quality, 6-7/10 intensity, no worsening or relieving factors. Presented to the hospital for further workup and evaluation of above symptomatology. No further symptoms.   REVIEW OF SYSTEMS:  CONSTITUTIONAL: Denies fevers, chills, fatigue, weakness.  EYES: Denies blurred vision, double vision, or eye pain.  EARS, NOSE, THROAT: Denies tinnitus, ear pain, hearing loss.  RESPIRATORY: Denies cough, wheeze, or shortness of breath. CARDIOVASCULAR: Denies chest pain, palpitations, edema. GASTROINTESTINAL: Denies nausea, vomiting, diarrhea, or abdominal pain.  GENITOURINARY: Denies dysuria or hematuria.  ENDOCRINE: Denies nocturia or thyroid problems.  HEMATOLOGIC AND LYMPHATIC: Denies easy bruising or bleeding.  SKIN: Positive for a rash with lesions, as described above. It is crusted over, involving the right neck as well as upper chest.  MUSCULOSKELETAL: Denies pain in neck, back, shoulder, knees, hips or arthritic symptoms.  NEUROLOGIC: Positive for paresthesias as well as weakness in the right face, as described above. PSYCHIATRIC: Denies anxiety or depressive symptoms. Otherwise, a full  review of systems performed by me is negative.   PAST MEDICAL HISTORY: Includes CVA with residual left-sided weakness; coronary artery disease; COPD, non-oxygen dependent; hypertension, essential; peripheral vascular disease and known carotid stenosis.   SOCIAL HISTORY: Positive for tobacco use. Denies any alcohol or drug use.  FAMILY HISTORY: Positive for CVA.   ALLERGIES: No known drug allergies.   HOME MEDICATIONS: Include aspirin 81 mg p.o. daily; Tylenol 650 mg p.o. at bedtime as needed for pain; lisinopril 20 mg p.o. daily; warfarin 3 mg p.o. Monday, Wednesday, Thursday, Friday, Saturday, 6 mg Thursday and Sunday; Keppra 1000 mg p.o. b.i.d.; Ativan 1 mg p.o. every 6 hours as needed for anxiety; sertraline 100 mg p.o. b.i.d.; atorvastatin 40 mg p.o. at bedtime; atenolol 50 mg p.o. b.i.d.   PHYSICAL EXAMINATION:  VITAL SIGNS: Temperature 98.7, heart rate 88, respirations 16, blood pressure 169/91, saturating 97% on room air. Weight 63.5 kg, BMI 22.6.  GENERAL: Chronically ill-appearing Caucasian female currently in no acute distress.  HEAD: Normocephalic, atraumatic.  EYES: Pupils equal, round, and reactive to light. Extraocular muscles intact. No scleral icterus. Has eyelid drooping on the right with conjunctival irritation.  EAR, NOSE, AND THROAT: Clear without exudates. No external lesions.   NECK: Supple. No thyromegaly. No nodules. No JVD.  MOUTH: Moist mucosal membrane. Dentition intact. No abscess noted.  PULMONARY: Clear to auscultation bilaterally without wheezes, rubs, or rhonchi. No use of accessory muscles. Good respiratory effort.  CHEST: Nontender to palpation.  CARDIOVASCULAR: S1, S2, regular rate and rhythm. No murmurs, rubs, or gallops. No edema. Pedal pulses 2+ bilaterally.  GASTROINTESTINAL: Soft, nontender, nondistended. No masses. Positive bowel sounds. No hepatosplenomegaly.  MUSCULOSKELETAL: No swelling, clubbing, or edema. Range of motion full in all extremities.   NEUROLOGIC: Has prominent right-sided paralysis including forehead, eye, and a  lip drooping. Otherwise the remainder of cranial nerves intact. Strength 5/5 in all extremities including proximal and distal flexion and extension. Sensation intact. Reflexes intact. Pronator drift within normal limits.  SKIN: Over the right upper chest and neck, there are scabbed over lesions with an erythematous base. No active blisters at this time. No active vesicles at this time.  PSYCHIATRIC: Mood and affect within normal limits. The patient is awake, alert, oriented x 3. Insight and judgment intact.   LABORATORY DATA: Sodium 137, potassium 4.4, chloride 105, bicarbonate 26, BUN 15, creatinine 0.74, glucose 191. LFTs within normal limits. WBC of 7.8, hemoglobin of 10.4, platelets of 367,000. INR of 1.5. CT head performed, which reveals atrophy with prior infarcts; however, no acute intracranial process. Chest x-ray performed reveals no acute cardiopulmonary process.   ASSESSMENT AND PLAN: A 69 year old female with history of cerebrovascular accident  with residual left-sided weakness presenting with right-sided weakness.  1.  Bell palsy: Case was discussed with neurology in the Emergency Department by Emergency Room staff. Started on acyclovir as well have an MRI ordered in searching for further etiology. Continue with aspirin and statin. This is most likely postherpetic, in relation to her zoster given that she does have complete paralysis of right-sided face including forehead.   2.  Postherpetic neuralgia: We will start gabapentin. Continue with acyclovir. The patient has been placed airborne precautions per hospital policy; however, with healed over lesions, no clear medical indication for this. I discussed the case with the nurse supervisor and agreed to continue with airborne precautions for this evening.  3.  Hypertension, essential: Continue her home medications.  4.  Chronic obstructive pulmonary disease: Not  in acute exacerbation.  5.  Venous thromboembolism prophylaxis: Warfarin therapy secondary to her cerebrovascular accident.   CODE STATUS: The patient is a full code.   TIME SPENT: 45 minutes.    ____________________________ Aaron Mose. Hower, MD dkh:bm D: 07/01/2014 22:59:00 ET T: 07/01/2014 23:36:40 ET JOB#: [id number]  cc: Aaron Mose. Hower, MD, <Dictator> DAVID Woodfin Ganja MD ELECTRONICALLY SIGNED 07/09/2014 20:44

## 2014-12-10 ENCOUNTER — Emergency Department: Payer: Medicare HMO

## 2014-12-10 ENCOUNTER — Inpatient Hospital Stay: Payer: Medicare HMO

## 2014-12-10 ENCOUNTER — Inpatient Hospital Stay
Admission: EM | Admit: 2014-12-10 | Discharge: 2014-12-16 | DRG: 811 | Disposition: A | Discharge status: Skilled Nursing Facility | Payer: Medicare HMO | Attending: Internal Medicine | Admitting: Internal Medicine

## 2014-12-10 ENCOUNTER — Encounter: Payer: Self-pay | Admitting: *Deleted

## 2014-12-10 DIAGNOSIS — I2510 Atherosclerotic heart disease of native coronary artery without angina pectoris: Secondary | ICD-10-CM | POA: Diagnosis present

## 2014-12-10 DIAGNOSIS — K2970 Gastritis, unspecified, without bleeding: Secondary | ICD-10-CM | POA: Diagnosis present

## 2014-12-10 DIAGNOSIS — I69354 Hemiplegia and hemiparesis following cerebral infarction affecting left non-dominant side: Secondary | ICD-10-CM

## 2014-12-10 DIAGNOSIS — G9340 Encephalopathy, unspecified: Secondary | ICD-10-CM | POA: Diagnosis present

## 2014-12-10 DIAGNOSIS — R29810 Facial weakness: Secondary | ICD-10-CM | POA: Diagnosis present

## 2014-12-10 DIAGNOSIS — J449 Chronic obstructive pulmonary disease, unspecified: Secondary | ICD-10-CM | POA: Diagnosis present

## 2014-12-10 DIAGNOSIS — I6529 Occlusion and stenosis of unspecified carotid artery: Secondary | ICD-10-CM | POA: Diagnosis present

## 2014-12-10 DIAGNOSIS — K922 Gastrointestinal hemorrhage, unspecified: Secondary | ICD-10-CM | POA: Diagnosis present

## 2014-12-10 DIAGNOSIS — Z801 Family history of malignant neoplasm of trachea, bronchus and lung: Secondary | ICD-10-CM

## 2014-12-10 DIAGNOSIS — Z820 Family history of epilepsy and other diseases of the nervous system: Secondary | ICD-10-CM

## 2014-12-10 DIAGNOSIS — R4182 Altered mental status, unspecified: Secondary | ICD-10-CM

## 2014-12-10 DIAGNOSIS — Z803 Family history of malignant neoplasm of breast: Secondary | ICD-10-CM

## 2014-12-10 DIAGNOSIS — I10 Essential (primary) hypertension: Secondary | ICD-10-CM | POA: Diagnosis present

## 2014-12-10 DIAGNOSIS — R32 Unspecified urinary incontinence: Secondary | ICD-10-CM | POA: Diagnosis present

## 2014-12-10 DIAGNOSIS — Z66 Do not resuscitate: Secondary | ICD-10-CM | POA: Diagnosis present

## 2014-12-10 DIAGNOSIS — D649 Anemia, unspecified: Secondary | ICD-10-CM | POA: Diagnosis not present

## 2014-12-10 DIAGNOSIS — K259 Gastric ulcer, unspecified as acute or chronic, without hemorrhage or perforation: Secondary | ICD-10-CM | POA: Diagnosis present

## 2014-12-10 DIAGNOSIS — Z7901 Long term (current) use of anticoagulants: Secondary | ICD-10-CM | POA: Diagnosis not present

## 2014-12-10 DIAGNOSIS — N390 Urinary tract infection, site not specified: Secondary | ICD-10-CM | POA: Diagnosis present

## 2014-12-10 DIAGNOSIS — F17210 Nicotine dependence, cigarettes, uncomplicated: Secondary | ICD-10-CM | POA: Diagnosis present

## 2014-12-10 DIAGNOSIS — G40909 Epilepsy, unspecified, not intractable, without status epilepticus: Secondary | ICD-10-CM | POA: Diagnosis present

## 2014-12-10 DIAGNOSIS — I639 Cerebral infarction, unspecified: Secondary | ICD-10-CM

## 2014-12-10 DIAGNOSIS — D500 Iron deficiency anemia secondary to blood loss (chronic): Principal | ICD-10-CM | POA: Diagnosis present

## 2014-12-10 DIAGNOSIS — K254 Chronic or unspecified gastric ulcer with hemorrhage: Secondary | ICD-10-CM | POA: Diagnosis not present

## 2014-12-10 DIAGNOSIS — Z79899 Other long term (current) drug therapy: Secondary | ICD-10-CM

## 2014-12-10 HISTORY — DX: Atherosclerotic heart disease of native coronary artery without angina pectoris: I25.10

## 2014-12-10 HISTORY — DX: Chronic obstructive pulmonary disease, unspecified: J44.9

## 2014-12-10 HISTORY — DX: Essential (primary) hypertension: I10

## 2014-12-10 HISTORY — DX: Cerebral infarction, unspecified: I63.9

## 2014-12-10 HISTORY — DX: Unspecified convulsions: R56.9

## 2014-12-10 LAB — CBC WITH DIFFERENTIAL/PLATELET
BASOS ABS: 0 10*3/uL (ref 0–0.1)
Basophils Relative: 1 %
EOS ABS: 0.1 10*3/uL (ref 0–0.7)
Eosinophils Relative: 1 %
HEMATOCRIT: 15.6 % — AB (ref 35.0–47.0)
Hemoglobin: 4 g/dL — CL (ref 12.0–15.0)
Lymphs Abs: 1 10*3/uL (ref 1.0–3.6)
MCH: 17.1 pg — ABNORMAL LOW (ref 26.0–34.0)
MCHC: 25.5 g/dL — ABNORMAL LOW (ref 32.0–36.0)
MCV: 67.2 fL — ABNORMAL LOW (ref 80.0–100.0)
Monocytes Absolute: 0.4 10*3/uL (ref 0.2–0.9)
Monocytes Relative: 5 %
NEUTROS ABS: 5.1 10*3/uL (ref 1.4–6.5)
Neutrophils Relative %: 78 %
PLATELETS: 237 10*3/uL (ref 150–440)
RBC: 2.32 MIL/uL — ABNORMAL LOW (ref 3.80–5.20)
RDW: 21.6 % — ABNORMAL HIGH (ref 11.5–14.5)
WBC: 6.5 10*3/uL (ref 3.6–11.0)

## 2014-12-10 LAB — COMPREHENSIVE METABOLIC PANEL
ALT: 9 U/L — ABNORMAL LOW (ref 14–54)
AST: 20 U/L (ref 15–41)
Albumin: 3.7 g/dL (ref 3.5–5.0)
Alkaline Phosphatase: 61 U/L (ref 38–126)
Anion gap: 7 (ref 5–15)
BUN: 23 mg/dL — ABNORMAL HIGH (ref 6–20)
CALCIUM: 8.7 mg/dL — AB (ref 8.9–10.3)
CO2: 21 mmol/L — AB (ref 22–32)
Chloride: 113 mmol/L — ABNORMAL HIGH (ref 101–111)
Creatinine, Ser: 0.84 mg/dL (ref 0.44–1.00)
GFR calc Af Amer: >60 mL/min (ref >60)
GFR calc non Af Amer: >60 mL/min (ref >60)
Glucose, Bld: 95 mg/dL (ref 65–99)
Potassium: 4.5 mmol/L (ref 3.5–5.1)
SODIUM: 141 mmol/L (ref 135–145)
Total Bilirubin: 0.2 mg/dL — ABNORMAL LOW (ref 0.3–1.2)
Total Protein: 6.7 g/dL (ref 6.5–8.1)

## 2014-12-10 LAB — PROTIME-INR
INR: 2.36
Prothrombin Time: 25.9 seconds — ABNORMAL HIGH (ref 11.4–15.0)

## 2014-12-10 LAB — GLUCOSE, CAPILLARY
GLUCOSE-CAPILLARY: 90 mg/dL (ref 65–99)
Glucose-Capillary: 101 mg/dL — ABNORMAL HIGH (ref 65–99)

## 2014-12-10 LAB — ABO/RH: ABO/RH(D): O NEG

## 2014-12-10 LAB — MRSA PCR SCREENING: MRSA BY PCR: NEGATIVE

## 2014-12-10 LAB — PREPARE RBC (CROSSMATCH)

## 2014-12-10 LAB — LACTIC ACID, PLASMA: LACTIC ACID, VENOUS: 1.2 mmol/L (ref 0.5–2.0)

## 2014-12-10 LAB — LIPASE, BLOOD: Lipase: 31 U/L (ref 22–51)

## 2014-12-10 LAB — TROPONIN I: Troponin I: <0.03 ng/mL (ref <0.031)

## 2014-12-10 MED ORDER — PANTOPRAZOLE SODIUM 40 MG IV SOLR
40.0000 mg | Freq: Two times a day (BID) | INTRAVENOUS | Status: DC
  Administered 2014-12-14 – 2014-12-16 (×6): 40 mg via INTRAVENOUS
  Filled 2014-12-10 (×6): qty 40

## 2014-12-10 MED ORDER — FENTANYL CITRATE (PF) 100 MCG/2ML IJ SOLN
INTRAMUSCULAR | Status: AC
  Administered 2014-12-10: 50 ug
  Filled 2014-12-10: qty 2

## 2014-12-10 MED ORDER — SODIUM CHLORIDE 0.9 % IV SOLN
1000.0000 mg | Freq: Two times a day (BID) | INTRAVENOUS | Status: DC
  Administered 2014-12-10 – 2014-12-13 (×6): 1000 mg via INTRAVENOUS
  Filled 2014-12-10 (×10): qty 10

## 2014-12-10 MED ORDER — SODIUM CHLORIDE 0.9 % IV SOLN
80.0000 mg | Freq: Once | INTRAVENOUS | Status: AC
  Administered 2014-12-10: 80 mg via INTRAVENOUS
  Filled 2014-12-10 (×2): qty 80

## 2014-12-10 MED ORDER — SODIUM CHLORIDE 0.9 % IJ SOLN
10.0000 mL | Freq: Two times a day (BID) | INTRAMUSCULAR | Status: DC
  Administered 2014-12-11: 10 mL
  Administered 2014-12-11: 21:00:00 3 mL
  Administered 2014-12-11: 10 mL
  Administered 2014-12-12: 21:00:00 3 mL
  Administered 2014-12-13: 20:00:00 10 mL

## 2014-12-10 MED ORDER — MIDAZOLAM HCL 2 MG/2ML IJ SOLN
INTRAMUSCULAR | Status: AC
  Administered 2014-12-10: 0.5 mg
  Filled 2014-12-10: qty 2

## 2014-12-10 MED ORDER — LORAZEPAM 2 MG/ML IJ SOLN
0.5000 mg | Freq: Once | INTRAMUSCULAR | Status: AC
  Administered 2014-12-10: 0.5 mg via INTRAVENOUS
  Filled 2014-12-10: qty 1

## 2014-12-10 MED ORDER — SODIUM CHLORIDE 0.9 % IV SOLN
10.0000 mL/h | Freq: Once | INTRAVENOUS | Status: AC
  Administered 2014-12-11: 10 mL/h via INTRAVENOUS

## 2014-12-10 MED ORDER — SODIUM CHLORIDE 0.9 % IJ SOLN
10.0000 mL | INTRAMUSCULAR | Status: DC | PRN
  Administered 2014-12-12: 21:00:00 3 mL
  Filled 2014-12-10: qty 40

## 2014-12-10 MED ORDER — SODIUM CHLORIDE 0.9 % IV SOLN
8.0000 mg/h | INTRAVENOUS | Status: DC
  Administered 2014-12-10 – 2014-12-11 (×2): 8 mg/h via INTRAVENOUS
  Filled 2014-12-10 (×2): qty 80

## 2014-12-10 MED ORDER — VITAMIN K1 10 MG/ML IJ SOLN
10.0000 mg | Freq: Once | INTRAVENOUS | Status: AC
  Administered 2014-12-10: 10 mg via INTRAVENOUS
  Filled 2014-12-10 (×2): qty 1

## 2014-12-10 MED ORDER — SODIUM CHLORIDE 0.9 % IV SOLN
INTRAVENOUS | Status: DC
  Administered 2014-12-10: 14:00:00 via INTRAVENOUS

## 2014-12-10 NOTE — ED Provider Notes (Signed)
[facility] Emergency Department Provider Note   ____________________________________________  Time seen: 2951 AM I have reviewed the triage vital signs and the triage nursing note.  HISTORY  Chief Complaint Altered Mental Status   Historian Patient is a limited/poor historian. History is obtained from EMS. History from son  HPI Michele Matthews is a 69 y.o. female who was found by son who lives with her with altered mental status, confusion, drooling, and he thinks she may have had a seizure. Patient does smell of urine. History of stroke with residual left-sided weakness. Symptoms are moderate. No recent illness noted. Patient is denying chest pain or abdominal pain. Patient states she's had a headache for 2 days which is right sided and throbbing. No reported history of chronic headaches.    Past Medical History  Diagnosis Date  . Stroke   . Hypertension     There are no active problems to display for this patient.   History reviewed. No pertinent past surgical history.  No current outpatient prescriptions on file.  Seizure medicine "1000 mg twice a day "  Allergies Review of patient's allergies indicates no known allergies.  History reviewed. No pertinent family history.  Social History Social History  Substance Use Topics  . Smoking status: Never Smoker   . Smokeless tobacco: Never Used  . Alcohol Use: No    Review of Systems  Constitutional: Negative for fever. Eyes: Negative for visual changes. ENT: Negative for sore throat. Cardiovascular: Negative for chest pain. Respiratory: Negative for shortness of breath. Gastrointestinal: Negative for abdominal pain. Genitourinary: Incontinence of urine Musculoskeletal: Negative for back pain. Skin: Negative for rash. Neurological: Positive for headache, no new focal weakness or numbness. 10 point Review of Systems otherwise  negative ____________________________________________   PHYSICAL EXAM:  VITAL SIGNS: ED Triage Vitals  Enc Vitals Group     BP 12/10/14 1059 107/48 mmHg     Pulse Rate 12/10/14 1059 80     Resp 12/10/14 1059 15     Temp --      Temp src --      SpO2 12/10/14 1059 100 %     Weight 12/10/14 1059 146 lb (66.225 kg)     Height 12/10/14 1059 5\' 5"  (1.651 m)     Head Cir --      Peak Flow --      Pain Score --      Pain Loc --      Pain Edu? --      Excl. in [place]? --      Constitutional: Alert and slightly confused, but cooperative and following commands.. Well appearing and in no distress. Eyes: Conjunctivae are normal. PERRL. Normal extraocular movements. ENT   Head: Normocephalic and atraumatic.   Nose: No congestion/rhinnorhea.   Mouth/Throat: Mucous membranes are moist. No tongue injury.   Neck: No stridor. No midline C-spine tenderness. Cardiovascular/Chest: Normal rate, regular rhythm.  No murmurs, rubs, or gallops. Respiratory: Normal respiratory effort without tachypnea nor retractions. Breath sounds are clear and equal bilaterally. Mild anxious and expiratory wheezes with no rhonchi. Gastrointestinal: Soft. No distention, no guarding, no rebound. Nontender   Genitourinary/rectal:Deferred Musculoskeletal: Generalized weakness in all extremities with slightly decreased strength in left arm and left leg. No facial droop. Neurologic:  Normal speech and language. No gross or focal neurologic deficits are appreciated. Skin:  Skin is warm, dry and intact. No rash noted. Slightly yellow.   ____________________________________________   EKG I, Lisa Roca, MD, the attending  physician have personally viewed and interpreted all ECGs.  81 bpm. Normal sinus rhythm. Narrow QRS. Normal axis. Nonspecific T wave. ____________________________________________  LABS (pertinent positives/negatives)  Complete metabolic panel without significant abnormality Troponin less  than 0.03 White blood count 6.5, hemoglobin 4.0, platelets 237 INR 2.36  ____________________________________________  RADIOLOGY All Xrays were viewed by me. Imaging interpreted by Radiologist.  CT scan contrast:   IMPRESSION: No acute intracranial abnormality.  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 __________________________________________  PROCEDURES  Procedure(s) performed: None Critical Care performed: CRITICAL CARE Performed by: Lisa Roca   Total critical care time: 35 minutes  Critical care time was exclusive of separately billable procedures and treating other patients.  Critical care was necessary to treat or prevent imminent or life-threatening deterioration.  Critical care was time spent personally by me on the following activities: development of treatment plan with patient and/or surrogate as well as nursing, discussions with consultants, evaluation of patient's response to treatment, examination of patient, obtaining history from patient or surrogate, ordering and performing treatments and interventions, ordering and review of laboratory studies, ordering and review of radiographic studies, pulse oximetry and re-evaluation of patient's condition.   ____________________________________________   ED COURSE / ASSESSMENT AND PLAN  CONSULTATIONS: Hospitalist for admission  Pertinent labs & imaging results that were available during my care of the patient were reviewed by me and considered in my medical decision making (see chart for details).   I was in the room to evaluate the patient when patient came in by EMS. There is some concern the patient might be post ictal. Also concern for possible stroke or even head injury. Patient is complaining of a headache and so considered intracranial imaging pertinent. CT head showed no intracranial acute antibodies.   Patient looked pale/yellowish, and I believe the source of this is the anemia. Her hemoglobin was  found to be 4.0. In searching for possible source, Hemoccult was performed and heme positive secretions. She is on warfarin for history of stroke, I will go ahead and reverse his with vitamin K. Patient will be given 3 units packed red blood cells. I discussed all this with the multiple family members at the bedside.  For GI bleed, Protonix was started.  Patient / Family / Caregiver informed of clinical course, medical decision-making process, and agree with plan.   ___________________________________________   FINAL CLINICAL IMPRESSION(S) / ED DIAGNOSES   Final diagnoses:  Altered mental status, unspecified altered mental status type  Gastrointestinal hemorrhage, unspecified gastritis, unspecified gastrointestinal hemorrhage type  Symptomatic anemia     Lisa Roca, MD 12/10/14 1258

## 2014-12-10 NOTE — ED Notes (Signed)
Per EMS, pt lives at home with family, pt has Hx seizure and CVA with left sided weakness, EMS established PIV rt AC NS 500 ml, CBG 124.  Pt is aware of name and birth date, pt has a strong odor of urine about person.  Pt was more lethargic upon EMS arrival, more alert at this time.

## 2014-12-10 NOTE — Consult Note (Signed)
Neurology: 69 y/o female with residual sided weakness from prior stroke, htn, sz, cppd CAD and was on coumadin along with generalized weakness admitted for unresponsiveness/confusion.  ? Facial weakness.  Neurologically pt is following commands and has chronic left sided weaness  This could be demand ischemia in the setting of Hb of 4. Not convinced new stroke. When stable obtain MRI brain and I will follow her imaging Con't keppra S/p d/c anticoagulation. Not clear what it is for as see history of stroke, HTN, CAD, and is currently in sinus rhythm.

## 2014-12-10 NOTE — ED Notes (Signed)
Dr Reita Cliche performed hemoccult exam, per DR Reita Cliche, pt positive guiac.

## 2014-12-10 NOTE — ED Notes (Signed)
Patient transported to CT 

## 2014-12-10 NOTE — Consult Note (Signed)
[facility]  [address] [place], [place] [address] Phone: [phone] Fax : [phone]  Consultation  Referring Provider:     No ref. provider found Primary Care Physician:  No primary care provider on file. Primary Gastroenterologist:  None          Reason for Consultation:     Symptomatic anemia  Date of Admission:  12/10/2014 Date of Consultation:  12/10/2014         HPI:   Michele Matthews is a 69 y.o. female who was admitted with anemia after feeling weak and tired. The patient is dependent on her family take care of her. The patient was found to have anemia with a hemoglobin of 4. The patient denies any black stools or bloody stools. The patient has also been admitted to the hospital in the past for GI bleeding and had a upper endoscopy that showed esophagitis and not on a PPI. The patient also had a colonoscopy in November 2013 with some diverticulosis but no sign of diverticulitis or sign of bleeding. The patient had been doing well without any complaints at home. The patient is quite lethargic and unable to give much of a history. She denies any abdominal pain but states she hasn't having pain in the right leg behind her knee. The patient has a history of Ramsey Hunt syndrome.  Past Medical History  Diagnosis Date  . Stroke   . Hypertension   . Seizures   . COPD (chronic obstructive pulmonary disease)   . Coronary artery disease     History reviewed. No pertinent past surgical history.  Prior to Admission medications   Medication Sig Start Date End Date Taking? Authorizing Provider  aspirin EC 81 MG tablet Take 81 mg by mouth daily.   Yes Historical Provider, MD  atenolol (TENORMIN) 50 MG tablet Take 50 mg by mouth 2 (two) times daily.   Yes Historical Provider, MD  atorvastatin (LIPITOR) 10 MG tablet Take 10 mg by mouth daily.   Yes Historical Provider, MD  ferrous sulfate 325 (65 FE) MG tablet Take 325 mg by mouth daily with breakfast.   Yes  Historical Provider, MD  gabapentin (NEURONTIN) 300 MG capsule Take 300 mg by mouth 3 (three) times daily.   Yes Historical Provider, MD  levETIRAcetam (KEPPRA) 1000 MG tablet Take 1,000 mg by mouth 2 (two) times daily.   Yes Historical Provider, MD  lisinopril (PRINIVIL,ZESTRIL) 20 MG tablet Take 20 mg by mouth daily as needed (blood pressure).   Yes Historical Provider, MD  LORazepam (ATIVAN) 1 MG tablet Take 1 mg by mouth every 8 (eight) hours.   Yes Historical Provider, MD  sertraline (ZOLOFT) 100 MG tablet Take 100 mg by mouth 2 (two) times daily.   Yes Historical Provider, MD  valACYclovir (VALTREX) 500 MG tablet Take 1,000 mg by mouth 3 (three) times daily.   Yes Historical Provider, MD  warfarin (COUMADIN) 5 MG tablet Take 2.5-5 mg by mouth See admin instructions. Take 5mg  on Monday, Tuesday, Wednesday, Friday and Saturday. Take 2.5mg  on Thursday and Sunday.   Yes Historical Provider, MD    Family History  Problem Relation Age of Onset  . Alzheimer's disease Father   . Lung cancer Sister   . Breast cancer Sister      Social History  Substance Use Topics  . Smoking status: Current Every Day Smoker -- 1.00 packs/day  . Smokeless tobacco: Never Used  . Alcohol Use: No    Allergies  as of 12/10/2014  . (No Known Allergies)    Review of Systems:    All systems reviewed and negative except where noted in HPI.   Physical Exam:  Vital signs in last 24 hours: Temp:  [98.1 F (36.7 C)-98.2 F (36.8 C)] 98.2 F (36.8 C) (08/10 1520) Pulse Rate:  [47-87] 83 (08/10 1400) Resp:  [15-19] 16 (08/10 1400) BP: (107-124)/(48-94) 114/69 mmHg (08/10 1520) SpO2:  [87 %-100 %] 97 % (08/10 1505) Weight:  [146 lb (66.225 kg)] 146 lb (66.225 kg) (08/10 1059)   General:   Pleasant, cooperative in NAD Head:  Normocephalic and atraumatic. Eyes:   No icterus.   Conjunctiva pale. PERRLA. Ears: Patient is hard of hearing. Neck:  Supple; no masses or thyroidomegaly Lungs: Respirations even and  unlabored. Lungs clear to auscultation bilaterally.   No wheezes, crackles, or rhonchi.  Heart:  Regular rate and rhythm;  Without murmur, clicks, rubs or gallops Abdomen:  Soft, nondistended, nontender. Normal bowel sounds. No appreciable masses or hepatomegaly.  No rebound or guarding.  Rectal:  Not performed. Msk:  Symmetrical without gross deformities.  Strength  Extremities:  Without edema, cyanosis or clubbing. Neurologic:  Alert and oriented x3;  grossly normal neurologically. Skin:  Intact without significant lesions or rashes. Cervical Nodes:  No significant cervical adenopathy. Psych: Lethargic  LAB RESULTS:  Recent Labs  12/10/14 1108  WBC 6.5  HGB 4.0*  HCT 15.6*  PLT 237   BMET  Recent Labs  12/10/14 1108  NA 141  K 4.5  CL 113*  CO2 21*  GLUCOSE 95  BUN 23*  CREATININE 0.84  CALCIUM 8.7*   LFT  Recent Labs  12/10/14 1108  PROT 6.7  ALBUMIN 3.7  AST 20  ALT 9*  ALKPHOS 61  BILITOT 0.2*   PT/INR  Recent Labs  12/10/14 1108  LABPROT 25.9*  INR 2.36    STUDIES: Ct Head Wo Contrast  12/10/2014   CLINICAL DATA:  Headache and confusion.  Left-sided weakness.  EXAM: CT HEAD WITHOUT CONTRAST  TECHNIQUE: Contiguous axial images were obtained from the base of the skull through the vertex without contrast.  COMPARISON:  07/01/2014 and 07/02/2014  FINDINGS: There is chronic encephalomalacia along the right MCA distribution. There is chronic encephalomalacia in the left parietal and occipital lobes. Findings compatible old infarcts. Negative for acute hemorrhage, mass lesion, midline shift, hydrocephalus or new large infarcts. Visualized mastoid air cells and paranasal sinuses are clear. No acute bone abnormality.  IMPRESSION: No acute intracranial abnormality.  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   Electronically Signed   By: Markus Daft M.D.   On: 12/10/2014 12:16      Impression / Plan:   Michele Matthews is a 69 y.o. y/o female with anemia who was  admitted to the hospital with a hemoglobin of 4. The patient is presently being transfused. The patient had upper GI bleeding at the last time she was admitted which was found on an upper endoscopy by Dr. Gustavo Lah. The patient has no overt sign of bleeding and states that she has not seen any sign of bleeding. The patient may need a repeat upper endoscopy. The patient should be kept nothing by mouth and her blood count will be reassessed tomorrow and if it is in a safe range she may then undergo an upper endoscopy.   Thank you for involving me in the care of this patient.      LOS: 0 days  Ollen Bowl, MD  12/10/2014, 3:54 PM   Note: This dictation was prepared with Dragon dictation along with smaller phrase technology. Any transcriptional errors that result from this process are unintentional.

## 2014-12-10 NOTE — ED Notes (Signed)
Lab called with critical value: HgB 4.0 notified Dr Reita Cliche

## 2014-12-10 NOTE — H&P (Addendum)
[facility] NAME: Michele Matthews    MR#:  [id number]  DATE OF BIRTH:  11/15/1945  DATE OF ADMISSION:  12/10/2014  PRIMARY CARE PHYSICIAN: Brayton Layman  REQUESTING/REFERRING PHYSICIAN: Lenice Llamas  CHIEF COMPLAINT:   Chief Complaint  Patient presents with  . Altered Mental Status    HISTORY OF PRESENT ILLNESS: Michele Matthews  is a 69 y.o. female with a known history of stroke with residual left-sided weakness, hypertension, seizure, COPD, coronary artery disease, carotid artery stenosis on Coumadin for that and has been feeling progressively weak for last few weeks. Last night she went to bed around 10:00 but then since 3 in the morning she had been having diarrhea and has visited the bathroom multiple times, but was flushing without checking her stool. She has chronic urinary incontinence and has to wear diapers for that. Today morning around 10:00 when daughter came to visit her she noticed her almost unresponsive in the bed and try to wake her up but she was not responsive she had been little weakness on her face and face was drooling towards the right side, when she woke up finally family tried to talk to her and she was confused about the year but she was able to identify the family members. Social family called 49 and brought her to the emergency room. On further questioning to the family they did not see her having any seizure activities and they don't feel her having any new weakness on her arms or legs she has chronic weakness on left side since her old stroke. As per her husband who is present in the room she looks very pale today compared to what she was looking yesterday. She and her husband denies using any over-the-counter pain medications or BC powders but she uses some pain medicine which is prescription pain medication 3 times a day and Tylenol 2 times a day for long time. In ER she was noted to have hemoglobin of 4 with stable  vital signs and ER physician ordered 3 units of blood transfusion C checked her stool which was positive for blood. As per husband she had an endoscopy done for low blood count 2 years ago which did not show any ulcers, she did not had any colonoscopy done. Now as far as husband can remember. Assessment she'll also have a strong smell in her urine for last 1 week.  PAST MEDICAL HISTORY:   Past Medical History  Diagnosis Date  . Stroke   . Hypertension   . Seizures   . COPD (chronic obstructive pulmonary disease)   . Coronary artery disease     PAST SURGICAL HISTORY: History reviewed. No pertinent past surgical history.  SOCIAL HISTORY:  Social History  Substance Use Topics  . Smoking status: Current Every Day Smoker -- 1.00 packs/day  . Smokeless tobacco: Never Used  . Alcohol Use: No    FAMILY HISTORY:  Family History  Problem Relation Age of Onset  . Alzheimer's disease Father   . Lung cancer Sister   . Breast cancer Sister     DRUG ALLERGIES: No Known Allergies  REVIEW OF SYSTEMS:   CONSTITUTIONAL: No fever,positive for fatigue and generalized weakness.  EYES: No blurred or double vision.  EARS, NOSE, AND THROAT: No tinnitus or ear pain.  RESPIRATORY: No cough, shortness of breath, wheezing or hemoptysis.  CARDIOVASCULAR: No chest pain, orthopnea, edema.  GASTROINTESTINAL: No nausea, vomiting, positive for diarrhea , no abdominal  pain. Did not check stool. GENITOURINARY: No dysuria, hematuria.  ENDOCRINE: No polyuria, nocturia,  HEMATOLOGY: No anemia, easy bruising or bleeding SKIN: No rash or lesion. MUSCULOSKELETAL: No joint pain or arthritis.   NEUROLOGIC: No tingling, numbness, weakness.  PSYCHIATRY: No anxiety or depression.   MEDICATIONS AT HOME:  Prior to Admission medications   Medication Sig Start Date End Date Taking? Authorizing Provider  aspirin EC 81 MG tablet Take 81 mg by mouth daily.   Yes Historical Provider, MD  atenolol (TENORMIN) 50 MG  tablet Take 50 mg by mouth 2 (two) times daily.   Yes Historical Provider, MD  atorvastatin (LIPITOR) 10 MG tablet Take 10 mg by mouth daily.   Yes Historical Provider, MD  ferrous sulfate 325 (65 FE) MG tablet Take 325 mg by mouth daily with breakfast.   Yes Historical Provider, MD  gabapentin (NEURONTIN) 300 MG capsule Take 300 mg by mouth 3 (three) times daily.   Yes Historical Provider, MD  levETIRAcetam (KEPPRA) 1000 MG tablet Take 1,000 mg by mouth 2 (two) times daily.   Yes Historical Provider, MD  lisinopril (PRINIVIL,ZESTRIL) 20 MG tablet Take 20 mg by mouth daily as needed (blood pressure).   Yes Historical Provider, MD  LORazepam (ATIVAN) 1 MG tablet Take 1 mg by mouth every 8 (eight) hours.   Yes Historical Provider, MD  sertraline (ZOLOFT) 100 MG tablet Take 100 mg by mouth 2 (two) times daily.   Yes Historical Provider, MD  valACYclovir (VALTREX) 500 MG tablet Take 1,000 mg by mouth 3 (three) times daily.   Yes Historical Provider, MD  warfarin (COUMADIN) 5 MG tablet Take 2.5-5 mg by mouth See admin instructions. Take 5mg  on Monday, Tuesday, Wednesday, Friday and Saturday. Take 2.5mg  on Thursday and Sunday.   Yes Historical Provider, MD      PHYSICAL EXAMINATION:   VITAL SIGNS: Blood pressure 124/94, pulse 86, resp. rate 18, height 5\' 5"  (1.651 m), weight 66.225 kg (146 lb), SpO2 99 %.  GENERAL:  69 y.o.-year-old patient lying in the bed with no acute distress.  EYES: Pupils equal, round, reactive to light and accommodation. No scleral icterus. Extraocular muscles intact.  HEENT: Head atraumatic, normocephalic. Oropharynx and nasopharynx clear.  NECK:  Supple, no jugular venous distention. No thyroid enlargement, no tenderness.  LUNGS: Normal breath sounds bilaterally, no wheezing, rales,rhonchi or crepitation. No use of accessory muscles of respiration.  CARDIOVASCULAR: S1, S2 normal. No murmurs, rubs, or gallops.  ABDOMEN: Soft, nontender, nondistended. Bowel sounds present. No  organomegaly or mass.  EXTREMITIES: No pedal edema, cyanosis, or clubbing.  NEUROLOGIC: face looks deviated towards right side. Muscle strength 4/5 in right side extremities, but 2-3 / 5 on left side.. Sensation intact. Gait not checked.  PSYCHIATRIC: The patient is slightly drowsy.  SKIN: No obvious rash, lesion, or ulcer.   LABORATORY PANEL:   CBC  Recent Labs Lab 12/10/14 1108  WBC 6.5  HGB 4.0*  HCT 15.6*  PLT 237  MCV 67.2*  MCH 17.1*  MCHC 25.5*  RDW 21.6*  LYMPHSABS 1.0  MONOABS 0.4  EOSABS 0.1  BASOSABS 0.0   ------------------------------------------------------------------------------------------------------------------  Chemistries   Recent Labs Lab 12/10/14 1108  NA 141  K 4.5  CL 113*  CO2 21*  GLUCOSE 95  BUN 23*  CREATININE 0.84  CALCIUM 8.7*  AST 20  ALT 9*  ALKPHOS 61  BILITOT 0.2*   ------------------------------------------------------------------------------------------------------------------ estimated creatinine clearance is 56.1 mL/min (by C-G formula based on Cr of 0.84). ------------------------------------------------------------------------------------------------------------------ No  results for input(s): TSH, T4TOTAL, T3FREE, THYROIDAB in the last 72 hours.  Invalid input(s): FREET3   Coagulation profile  Recent Labs Lab 12/10/14 1108  INR 2.36   ------------------------------------------------------------------------------------------------------------------- No results for input(s): DDIMER in the last 72 hours. -------------------------------------------------------------------------------------------------------------------  Cardiac Enzymes  Recent Labs Lab 12/10/14 1108  TROPONINI <0.03   ------------------------------------------------------------------------------------------------------------------ Invalid input(s):  POCBNP  ---------------------------------------------------------------------------------------------------------------  Urinalysis    Component Value Date/Time   COLORURINE Yellow 03/27/2014 1845   APPEARANCEUR Hazy 03/27/2014 1845   LABSPEC 1.028 03/27/2014 1845   PHURINE 5.0 03/27/2014 1845   GLUCOSEU Negative 03/27/2014 1845   HGBUR 1+ 03/27/2014 1845   BILIRUBINUR Negative 03/27/2014 1845   KETONESUR Negative 03/27/2014 1845   PROTEINUR Negative 03/27/2014 1845   NITRITE Positive 03/27/2014 1845   LEUKOCYTESUR 3+ 03/27/2014 1845     RADIOLOGY: Ct Head Wo Contrast  12/10/2014   CLINICAL DATA:  Headache and confusion.  Left-sided weakness.  EXAM: CT HEAD WITHOUT CONTRAST  TECHNIQUE: Contiguous axial images were obtained from the base of the skull through the vertex without contrast.  COMPARISON:  07/01/2014 and 07/02/2014  FINDINGS: There is chronic encephalomalacia along the right MCA distribution. There is chronic encephalomalacia in the left parietal and occipital lobes. Findings compatible old infarcts. Negative for acute hemorrhage, mass lesion, midline shift, hydrocephalus or new large infarcts. Visualized mastoid air cells and paranasal sinuses are clear. No acute bone abnormality.  IMPRESSION: No acute intracranial abnormality.  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   Electronically Signed   By: Markus Daft M.D.   On: 12/10/2014 12:16    IMPRESSION AND PLAN:  * Acute symptomatic anemia  The patient had some diarrhea this multiple times last night but she did not check the stool, her hemoglobin in the past we have opted 12 and today it is 4.0. Maybe her diarrhea was because of blood, in as per ER her stool is positive for guaiac. She does not have any abdominal pain. Denies using any over-the-counter pain medication but takes 2 Tylenol every day. Will keep on IV Protonix drip and transfuse 3 units of PRBC per ER.  Call GI consult for further management. Keep nothing by mouth  meanwhile  Patient is taking Coumadin for her carotid stenosis, INR more than 2, ER order vitamin K. I agree with that.  * Facial weakness  This is new as per family, and patient has some confusion regarding the date and year.  We'll get an MRI of the brain, and neurology consult.  She has history of seizure disorder, and taking keppra daily. It does not look like she had a seizure episode.  * Smell in the urine  Urinalysis order is placed but sample is not same. We need to follow the report.  * History of coronary artery disease and hypertension  Because of her GI bleed-her aspirin and her blood pressure medications at this time.  * Smoking  Counseled to quit smoking for 4 minutes and offered nicotine patch.  All the records are reviewed and case discussed with ED provider. Management plans discussed with the patient, family and they are in agreement.  CODE STATUS: FUll    TOTAL TIME TAKING CARE OF THIS PATIENT: 50 minutes critical care.  Condition is critical due to severe anemia, and possible lower GI bleed, we will monitor her in stepdown unit.  Vaughan Basta M.D on 12/10/2014   Between 7am to 6pm - Pager - [phone]  After 6pm go to [email or web address] - password [email or web address]  [facility]  Office  [phone]  CC: Primary care physician; No primary care provider on file.

## 2014-12-10 NOTE — Progress Notes (Signed)
OK to use RIJ per Dr.Mungal.

## 2014-12-10 NOTE — ED Notes (Signed)
Called lab for difficult stick and blood bank blood draw.

## 2014-12-10 NOTE — Procedures (Signed)
12/10/2014  Procedure Note: Central Venous Catheter Placement RIJ using ultrasound Barbee [place] , [id number] , [id number]  Indications: Hemodynamic monitoring / Intravenous access  Benefits, risks (including bleeding, infection,  Injury, etc.), and alternatives explained to husband who voiced understanding.  Questions were sought and answered.  Husband agreed to proceed with the procedure.  Consent is signed and on chart. A time-out was completed verifying correct patient, procedure and site. A triple lumen catheter available at the time of procedure.  The patient was placed in a dependent position appropriate for central line placement based on the vein to be cannulated, vein located under ultrasound.  The patient's RIGHT INTERNAL JUGULAR was prepped and draped in a sterile fashion. 1% Lidocaine WAS used to anesthetize the surrounding skin area.  A triple lumen catheter was introduced into the RIGHT IJ using Seldinger technique.  The catheter was threaded smoothly over the guide wire and appropriate blood return was obtained.  Each lumen of the catheter was evacuated of air and flushed with sterile saline.  The catheter was then sutured in place to the skin and a sterile dressing applied.  Perfusion to the extremity distal to the point of catheter insertion was checked and found to be adequate.  Chest X-ray was ordered for confirmation of placement.    The patient tolerated the procedure well and there were no complications.  Vilinda Boehringer, MD [facility] Pager [phone] On Call Pager [phone]

## 2014-12-11 ENCOUNTER — Encounter
Admission: EM | Disposition: A | Discharge status: Skilled Nursing Facility | Payer: Self-pay | Source: Home / Self Care | Attending: Internal Medicine

## 2014-12-11 DIAGNOSIS — K922 Gastrointestinal hemorrhage, unspecified: Secondary | ICD-10-CM

## 2014-12-11 DIAGNOSIS — K254 Chronic or unspecified gastric ulcer with hemorrhage: Secondary | ICD-10-CM

## 2014-12-11 HISTORY — PX: ESOPHAGOGASTRODUODENOSCOPY (EGD) WITH PROPOFOL: SHX5813

## 2014-12-11 LAB — URINALYSIS COMPLETE WITH MICROSCOPIC (ARMC ONLY)
BACTERIA UA: NONE SEEN
Bilirubin Urine: NEGATIVE
Glucose, UA: NEGATIVE mg/dL
Ketones, ur: NEGATIVE mg/dL
NITRITE: NEGATIVE
Protein, ur: NEGATIVE mg/dL
SPECIFIC GRAVITY, URINE: 1.004 — AB (ref 1.005–1.030)
pH: 6 (ref 5.0–8.0)

## 2014-12-11 LAB — TYPE AND SCREEN
ABO/RH(D): O NEG
ANTIBODY SCREEN: NEGATIVE
UNIT DIVISION: 0
UNIT DIVISION: 0
Unit division: 0

## 2014-12-11 LAB — CBC
HEMATOCRIT: 30.2 % — AB (ref 35.0–47.0)
HEMOGLOBIN: 9.2 g/dL — AB (ref 12.0–16.0)
MCH: 22.2 pg — ABNORMAL LOW (ref 26.0–34.0)
MCHC: 30.5 g/dL — ABNORMAL LOW (ref 32.0–36.0)
MCV: 72.7 fL — AB (ref 80.0–100.0)
Platelets: 225 10*3/uL (ref 150–440)
RBC: 4.16 MIL/uL (ref 3.80–5.20)
RDW: 24.4 % — AB (ref 11.5–14.5)
WBC: 11 10*3/uL (ref 3.6–11.0)

## 2014-12-11 LAB — BASIC METABOLIC PANEL
ANION GAP: 12 (ref 5–15)
BUN: 15 mg/dL (ref 6–20)
CO2: 23 mmol/L (ref 22–32)
Calcium: 9.3 mg/dL (ref 8.9–10.3)
Chloride: 105 mmol/L (ref 101–111)
Creatinine, Ser: 0.7 mg/dL (ref 0.44–1.00)
GFR calc Af Amer: >60 mL/min (ref >60)
GFR calc non Af Amer: >60 mL/min (ref >60)
Glucose, Bld: 109 mg/dL — ABNORMAL HIGH (ref 65–99)
Potassium: 3.7 mmol/L (ref 3.5–5.1)
Sodium: 140 mmol/L (ref 135–145)

## 2014-12-11 LAB — GLUCOSE, CAPILLARY: Glucose-Capillary: 133 mg/dL — ABNORMAL HIGH (ref 65–99)

## 2014-12-11 SURGERY — ESOPHAGOGASTRODUODENOSCOPY (EGD) WITH PROPOFOL
Anesthesia: General

## 2014-12-11 MED ORDER — FUROSEMIDE 10 MG/ML IJ SOLN
40.0000 mg | Freq: Once | INTRAMUSCULAR | Status: AC
  Administered 2014-12-11: 40 mg via INTRAVENOUS
  Filled 2014-12-11: qty 4

## 2014-12-11 MED ORDER — IPRATROPIUM-ALBUTEROL 0.5-2.5 (3) MG/3ML IN SOLN: 3.0000 mL | RESPIRATORY_TRACT | Status: DC | PRN

## 2014-12-11 MED ORDER — NICOTINE 10 MG IN INHA
1.0000 | RESPIRATORY_TRACT | Status: DC | PRN
  Administered 2014-12-11: 1 via RESPIRATORY_TRACT
  Filled 2014-12-11: qty 36

## 2014-12-11 MED ORDER — FENTANYL CITRATE (PF) 100 MCG/2ML IJ SOLN
INTRAMUSCULAR | Status: DC | PRN
  Administered 2014-12-11: 50 ug via INTRAVENOUS

## 2014-12-11 MED ORDER — LORAZEPAM 2 MG/ML IJ SOLN
1.0000 mg | Freq: Four times a day (QID) | INTRAMUSCULAR | Status: DC | PRN
  Administered 2014-12-11: 1 mg via INTRAVENOUS
  Filled 2014-12-11: qty 1

## 2014-12-11 MED ORDER — NICOTINE 21 MG/24HR TD PT24
21.0000 mg | MEDICATED_PATCH | Freq: Every day | TRANSDERMAL | Status: DC
  Administered 2014-12-12 – 2014-12-16 (×5): 21 mg via TRANSDERMAL
  Filled 2014-12-11 (×5): qty 1

## 2014-12-11 MED ORDER — MIDAZOLAM HCL 5 MG/5ML IJ SOLN
INTRAMUSCULAR | Status: DC | PRN
  Administered 2014-12-11: 1 mg via INTRAVENOUS
  Administered 2014-12-11: 2 mg via INTRAVENOUS

## 2014-12-11 MED ORDER — DEXTROSE 5 % IV SOLN
1.0000 g | INTRAVENOUS | Status: DC
  Administered 2014-12-11 – 2014-12-13 (×3): 1 g via INTRAVENOUS
  Filled 2014-12-11 (×5): qty 10

## 2014-12-11 MED ORDER — DIAZEPAM 5 MG/ML IJ SOLN
2.5000 mg | Freq: Four times a day (QID) | INTRAMUSCULAR | Status: DC | PRN
  Administered 2014-12-11 – 2014-12-12 (×3): 2.5 mg via INTRAVENOUS
  Filled 2014-12-11 (×3): qty 2

## 2014-12-11 NOTE — Progress Notes (Signed)
Report given to 1C nurse. Patient with telemetry monitor on. Transported to 104 by Ronalee Belts with transport at 1700. Patient tolerated well.

## 2014-12-11 NOTE — Progress Notes (Signed)
[facility] [place] NAME: Michele Matthews    MR#:  [id number]  DATE OF BIRTH:  Nov 17, 1945  SUBJECTIVE:  CHIEF COMPLAINT:   Chief Complaint  Patient presents with  . Altered Mental Status  feeling better, wants to try ice REVIEW OF SYSTEMS:  Review of Systems  Constitutional: Negative for fever, weight loss, malaise/fatigue and diaphoresis.  HENT: Negative for ear discharge, ear pain, hearing loss, nosebleeds, sore throat and tinnitus.   Eyes: Negative for blurred vision and pain.  Respiratory: Negative for cough, hemoptysis, shortness of breath and wheezing.   Cardiovascular: Negative for chest pain, palpitations, orthopnea and leg swelling.  Gastrointestinal: Negative for heartburn, nausea, vomiting, abdominal pain, diarrhea, constipation and blood in stool.  Genitourinary: Negative for dysuria, urgency and frequency.  Musculoskeletal: Negative for myalgias and back pain.  Skin: Negative for itching and rash.  Neurological: Negative for dizziness, tingling, tremors, focal weakness, seizures, weakness and headaches.  Psychiatric/Behavioral: Negative for depression. The patient is not nervous/anxious.    DRUG ALLERGIES:  No Known Allergies VITALS:  Blood pressure 141/72, pulse 107, temperature 102 F (38.9 C), temperature source Tympanic, resp. rate 31, height 5\' 5"  (1.651 m), weight 66.225 kg (146 lb), SpO2 98 %. PHYSICAL EXAMINATION:  Physical Exam  Constitutional: She is oriented to person, place, and time and well-developed, well-nourished, and in no distress.  HENT:  Head: Normocephalic and atraumatic.  Eyes: Conjunctivae and EOM are normal. Pupils are equal, round, and reactive to light.  Neck: Normal range of motion. Neck supple. No tracheal deviation present. No thyromegaly present.  Cardiovascular: Normal rate, regular rhythm and normal heart sounds.   Pulmonary/Chest: Effort normal and breath sounds normal. No respiratory  distress. She has no wheezes. She exhibits no tenderness.  Abdominal: Soft. Bowel sounds are normal. She exhibits no distension. There is no tenderness.  Musculoskeletal: Normal range of motion.  Neurological: She is alert and oriented to person, place, and time. No cranial nerve deficit.  Skin: Skin is warm and dry. No rash noted.  Psychiatric: Mood and affect normal.   LABORATORY PANEL:   CBC  Recent Labs Lab 12/11/14 0452  WBC 11.0  HGB 9.2*  HCT 30.2*  PLT 225   ------------------------------------------------------------------------------------------------------------------ Chemistries   Recent Labs Lab 12/10/14 1108 12/11/14 0452  NA 141 140  K 4.5 3.7  CL 113* 105  CO2 21* 23  GLUCOSE 95 109*  BUN 23* 15  CREATININE 0.84 0.70  CALCIUM 8.7* 9.3  AST 20  --   ALT 9*  --   ALKPHOS 61  --   BILITOT 0.2*  --    RADIOLOGY:  Mr Brain Wo Contrast  12/10/2014   CLINICAL DATA:  Difficult to arouse. Multiple prior CVAs. Facial droop. CVA.  EXAM: MRI HEAD WITHOUT CONTRAST  TECHNIQUE: Multiplanar, multiecho pulse sequences of the brain and surrounding structures were obtained without intravenous contrast.  COMPARISON:  CT of the head without contrast 12/10/2014. MRI brain 07/02/2014.  FINDINGS: The diffusion-weighted images demonstrate no acute or subacute infarct. A remote hemorrhagic infarct is again seen within the posterior medial left occipital lobe. Remote nonhemorrhagic infarcts are evident within the right MCA distribution. Ex vacuo dilation of the right lateral ventricle is noted. Mild to moderate periventricular T2 changes are otherwise stable.  The study is moderately degraded by patient motion.  Flow is present in the left internal carotid artery and vertebrobasilar arteries. Abnormal signal is again noted in the right internal carotid  artery with reconstitution at the level of the posterior communicating artery. The globes and orbits are intact. Paranasal sinuses and  mastoid air cells are clear.  Midline structures are unremarkable.  IMPRESSION: 1. No acute intracranial abnormality. 2. Remote left PCA territory hemorrhagic infarct. 3. 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 4. Generalized atrophy and white matter disease is otherwise stable.   Electronically Signed   By: San Morelle M.D.   On: 12/10/2014 19:05   Dg Chest Port 1 View  12/10/2014   CLINICAL DATA:  Central catheter placement  EXAM: PORTABLE CHEST - 1 VIEW  COMPARISON:  July 01, 2014  FINDINGS: Central catheter tip is in the superior vena cava. No pneumothorax. There is moderate interstitial edema. There is no appreciable airspace consolidation. There is a minimal left effusion. Heart is borderline enlarged with mild pulmonary venous hypertension. There is a hiatal hernia present.  IMPRESSION: Central catheter tip in superior vena cava. No pneumothorax. Evidence a degree of congestive heart failure. Hiatal hernia present.   Electronically Signed   By: Lowella Grip III M.D.   On: 12/10/2014 18:12   ASSESSMENT AND PLAN:   * Acute symptomatic blood loss anemia - On admission her Hb 4.0. - continue IV Protonix drip - s/p transfusion of 3 units of PRBC - Hb 9.2 EGD showed gastric ulcer with clean base and gastritis.  * Facial weakness: doubt CVA, seem to have resolved. - neg MRI of the brain, appreciate neurology consult.  * UTI: per UA, will start Rocephin, get urine c/s.  * History of coronary artery disease and hypertension Because of her GI bleed-her aspirin and her blood pressure medications at this time.  * Smoking: Counseled to quit smoking for 4 minutes and will start nicotine patch.   All the records are reviewed and case discussed with Care Management/Social Worker. Management plans discussed with the patient and she is in agreement.  CODE STATUS: Full Code  TOTAL TIME TAKING CARE OF THIS PATIENT: 35 minutes.   Can transfer her to floors today  More than  50% of the time was spent in counseling/coordination of care: YES  POSSIBLE D/C IN 1-2 DAYS, DEPENDING ON CLINICAL CONDITION.   [facility], Ashlea Dusing M.D on 12/11/2014 at 1:05 PM  Between 7am to 6pm - Pager - [phone]  After 6pm go to [email or web address] - password [email or web address] [facility]  [place] [facility]  Office  [phone]  CC:  Primary care physician; No primary care provider on file.

## 2014-12-11 NOTE — Care Management (Signed)
Patient was off unit when I rounded. RNCM to continue to follow.

## 2014-12-11 NOTE — Progress Notes (Signed)
Patient pulled TLC out of right neck during EGD procedure while in Endoscopy. Sutures, line securement device and old dressing removed upon arrival back to CCU 3. Area cleaned and new dressing placed to area. Patient tolerated well. Patient remains confused but answers questions appropriately. Still tries to get out of bed to "go outside and smoke". Family at side.

## 2014-12-11 NOTE — Op Note (Signed)
[facility] Patient Name: Michele Matthews Procedure Date: 12/11/2014 10:30 AM MRN: [id number] Account #: [id number] Date of Birth: 02-01-1946 Admit Type: Inpatient Age: 69 Room: [facility] ENDO ROOM 1 Gender: Female Note Status: Finalized Procedure:         Upper GI endoscopy Indications:       Iron deficiency anemia Providers:         Lucilla Lame, MD Medicines:         Fentanyl 50 micrograms IV, Midazolam 3 mg IV Complications:     No immediate complications. Procedure:         Pre-Anesthesia Assessment:                    - Prior to the procedure, a History and Physical was                     performed, and patient medications and allergies were                     reviewed. The patient's tolerance of previous anesthesia                     was also reviewed. The risks and benefits of the procedure                     and the sedation options and risks were discussed with the                     patient. All questions were answered, and informed consent                     was obtained. Prior Anticoagulants: The patient has taken                     no previous anticoagulant or antiplatelet agents. ASA                     Grade Assessment: II - A patient with mild systemic                     disease. After reviewing the risks and benefits, the                     patient was deemed in satisfactory condition to undergo                     the procedure.                    After obtaining informed consent, the endoscope was passed                     under direct vision. Throughout the procedure, the                     patient's blood pressure, pulse, and oxygen saturations                     were monitored continuously. The Endoscope was introduced                     through the mouth, and advanced to the second part of                     duodenum.  The upper GI endoscopy was accomplished without                     difficulty. The patient tolerated  the procedure well. Findings:      The examined esophagus was normal.      One non-bleeding superficial gastric ulcer with no stigmata of bleeding       was found in the gastric body.      Localized moderate inflammation characterized by erosions was found in       the gastric antrum.      The examined duodenum was normal. Impression:        - Normal esophagus.                    - Gastric ulcer with clean base.                    - Gastritis.                    - Normal examined duodenum.                    - No specimens collected. Recommendation:    - Use a proton pump inhibitor PO daily. Procedure Code(s): --- Professional ---                    [phone], Esophagogastroduodenoscopy, flexible, transoral;                     diagnostic, including collection of specimen(s) by                     brushing or washing, when performed (separate procedure) Diagnosis Code(s): --- Professional ---                    D50.9, Iron deficiency anemia, unspecified                    K25.9, Gastric ulcer, unspecified as acute or chronic,                     without hemorrhage or perforation                    K29.70, Gastritis, unspecified, without bleeding CPT copyright 2014 American Medical Association. All rights reserved. The codes documented in this report are preliminary and upon coder review may  be revised to meet current compliance requirements. Lucilla Lame, MD 12/11/2014 10:44:49 AM This report has been signed electronically. Number of Addenda: 0 Note Initiated On: 12/11/2014 10:30 AM      [facility]

## 2014-12-11 NOTE — Progress Notes (Signed)
3 units of prbc's in total were given.  Hemoglobin now 9.2.  Pt confused at times but is appropriate.  When the third unit of blood was given, pt became very wheezy, sob.  Prime doc was called and lasix was given with good results.  Prime doc also ordered prn breathing treatments.  NS was discontinued.  Pt was able to rest during the night.  No signs of any active bleeding present at all.

## 2014-12-12 ENCOUNTER — Encounter: Payer: Self-pay | Admitting: Gastroenterology

## 2014-12-12 LAB — BASIC METABOLIC PANEL
Anion gap: 9 (ref 5–15)
BUN: 10 mg/dL (ref 6–20)
CHLORIDE: 106 mmol/L (ref 101–111)
CO2: 25 mmol/L (ref 22–32)
Calcium: 9.1 mg/dL (ref 8.9–10.3)
Creatinine, Ser: 0.55 mg/dL (ref 0.44–1.00)
GFR calc Af Amer: >60 mL/min (ref >60)
GFR calc non Af Amer: >60 mL/min (ref >60)
GLUCOSE: 108 mg/dL — AB (ref 65–99)
POTASSIUM: 3.5 mmol/L (ref 3.5–5.1)
SODIUM: 140 mmol/L (ref 135–145)

## 2014-12-12 LAB — CBC
HEMATOCRIT: 30.8 % — AB (ref 35.0–47.0)
Hemoglobin: 9.4 g/dL — ABNORMAL LOW (ref 12.0–16.0)
MCH: 22 pg — ABNORMAL LOW (ref 26.0–34.0)
MCHC: 30.5 g/dL — ABNORMAL LOW (ref 32.0–36.0)
MCV: 72.3 fL — AB (ref 80.0–100.0)
Platelets: 203 10*3/uL (ref 150–440)
RBC: 4.26 MIL/uL (ref 3.80–5.20)
RDW: 24.9 % — ABNORMAL HIGH (ref 11.5–14.5)
WBC: 8.6 10*3/uL (ref 3.6–11.0)

## 2014-12-12 MED ORDER — ACETAMINOPHEN 325 MG PO TABS
650.0000 mg | ORAL_TABLET | Freq: Four times a day (QID) | ORAL | Status: DC | PRN
Start: 2014-12-12 — End: 2014-12-17
  Administered 2014-12-12 – 2014-12-16 (×2): 650 mg via ORAL
  Filled 2014-12-12 (×2): qty 2

## 2014-12-12 NOTE — Progress Notes (Addendum)
[facility] [place] NAME: Michele Matthews    MR#:  [id number]  DATE OF BIRTH:  [birth year]  SUBJECTIVE:  CHIEF COMPLAINT:   Chief Complaint  Patient presents with  . Altered Mental Status  feeling better, hemoglobin stable, family very concerned, spiking fever up to 102, now also tachycardic REVIEW OF SYSTEMS:  Review of Systems  Constitutional: Negative for fever, weight loss, malaise/fatigue and diaphoresis.  HENT: Negative for ear discharge, ear pain, hearing loss, nosebleeds, sore throat and tinnitus.   Eyes: Negative for blurred vision and pain.  Respiratory: Negative for cough, hemoptysis, shortness of breath and wheezing.   Cardiovascular: Negative for chest pain, palpitations, orthopnea and leg swelling.  Gastrointestinal: Negative for heartburn, nausea, vomiting, abdominal pain, diarrhea, constipation and blood in stool.  Genitourinary: Negative for dysuria, urgency and frequency.  Musculoskeletal: Negative for myalgias and back pain.  Skin: Negative for itching and rash.  Neurological: Negative for dizziness, tingling, tremors, focal weakness, seizures, weakness and headaches.  Psychiatric/Behavioral: Negative for depression. The patient is not nervous/anxious.    DRUG ALLERGIES:  No Known Allergies VITALS:  Blood pressure 131/60, pulse 115, temperature 98 F (36.7 C), temperature source Oral, resp. rate 22, height 5\' 5"  (1.651 m), weight 66.225 kg (146 lb), SpO2 97 %. PHYSICAL EXAMINATION:  Physical Exam  Constitutional: She is oriented to person, place, and time and well-developed, well-nourished, and in no distress.  HENT:  Head: Normocephalic and atraumatic.  Eyes: Conjunctivae and EOM are normal. Pupils are equal, round, and reactive to light.  Neck: Normal range of motion. Neck supple. No tracheal deviation present. No thyromegaly present.  Cardiovascular: Normal rate, regular rhythm and normal heart sounds.    Pulmonary/Chest: Effort normal and breath sounds normal. No respiratory distress. She has no wheezes. She exhibits no tenderness.  Abdominal: Soft. Bowel sounds are normal. She exhibits no distension. There is no tenderness.  Musculoskeletal: Normal range of motion.  Neurological: She is alert and oriented to person, place, and time. No cranial nerve deficit.  Skin: Skin is warm and dry. No rash noted.  Psychiatric: Mood and affect normal.   LABORATORY PANEL:   CBC  Recent Labs Lab 12/12/14 0554  WBC 8.6  HGB 9.4*  HCT 30.8*  PLT 203   ------------------------------------------------------------------------------------------------------------------ Chemistries   Recent Labs Lab 12/10/14 1108  12/12/14 0554  NA 141  < > 140  K 4.5  < > 3.5  CL 113*  < > 106  CO2 21*  < > 25  GLUCOSE 95  < > 108*  BUN 23*  < > 10  CREATININE 0.84  < > 0.55  CALCIUM 8.7*  < > 9.1  AST 20  --   --   ALT 9*  --   --   ALKPHOS 61  --   --   BILITOT 0.2*  --   --   < > = values in this interval not displayed. RADIOLOGY:  No results found. ASSESSMENT AND PLAN:   * Acute symptomatic blood loss anemia - On admission her Hb 4.0  - continue Protonix - s/p transfusion of 3 units of PRBC - Hb 9.4 EGD showed gastric ulcer with clean base and gastritis.  * Facial weakness: no CVA, seem to have resolved. - neg MRI of the brain, appreciate neurology consult.  * UTI: per UA, continue Rocephin, urine c/s negative. Getting febrile and tachycardic worrisome for sepsis  * History of coronary artery disease and  hypertension Because of her GI bleed-her aspirin and her blood pressure medications at this time.  * Smoking: Counseled to quit smoking for 4 minutes and on nicotine patch.   All the records are reviewed and case discussed with Care Management/Social Worker. Management plans discussed with the patient, family and she is in agreement.  CODE STATUS: DO NOT RESUSCITATE  TOTAL TIME  TAKING CARE OF THIS PATIENT: 35 minutes.   Can transfer her to floors today  More than 50% of the time was spent in counseling/coordination of care: YES  POSSIBLE D/C IN 1-2 DAYS, DEPENDING ON CLINICAL CONDITION.   [facility], Jermeka Schlotterbeck M.D on 12/12/2014 at 4:53 PM  Between 7am to 6pm - Pager - [phone]  After 6pm go to [email or web address] - password [email or web address] [facility]  [place] [facility]  Office  [phone]  CC:  Primary care physician; No primary care provider on file.

## 2014-12-12 NOTE — Plan of Care (Signed)
Problem: Discharge Progression Outcomes Goal: Other Discharge Outcomes/Goals Outcome: Progressing Plan of care progress to goal for:  1. Discharge Plan:         Plan to Discharge Home. 2. Pain:         Denies Pain. 3. Hemodynamically Stable:         VSS.         Remains on IV Antibiotics.         Labs Improving.          4. Complications:         Sitter at Bedside PRN. 5. Diet:         Clear Liquid Diet. Tolerating Well. 6. Activity:         OOB to [facility] with 1 Person Maximum Assist.

## 2014-12-12 NOTE — Care Management Important Message (Signed)
Important Message  Patient Details  Name: ELINORA WEIGAND MRN: [id number] Date of Birth: [birth year]   Medicare Important Message Given:  Yes-second notification given    Darius Bump Allmond 12/12/2014, 10:47 AM

## 2014-12-12 NOTE — Plan of Care (Signed)
Problem: Discharge Progression Outcomes Goal: Discharge plan in place and appropriate Outcome: Progressing Individualization of Care Pt prefers to be called Michele Matthews Hx of CVA with left side weakness, HTN, seizures, COPD, CAD, carotid artery stenosis. Admitted due to ICU for altered mental status and Hgb of 4.0.  Patient transferred to floor 12/10/14.     Goal: Other Discharge Outcomes/Goals Patient without complaints of pain this shift.  VSS stable throughout shift BP 147/86 mmHg  Pulse 113  Temp(Src) 97.9 F (36.6 C) (Oral)  Resp 20  Ht 5\' 5"  (1.651 m)  Wt 146 lb (66.225 kg)  BMI 24.30 kg/m2  SpO2 99%.  Patient anxious and confused throughout night and continually tried to get out of bed to go home or go outside to smoke.  Valium given x1 per eMAR with effective results.  Patient able to relax and get some rest.  Patient also continually pulled off tele leads so tele was D/C by Dr. Lavetta Nielsen this shift.  Patient is incontinent x 2 and will try to get out of bed to go to bathroom.  Patient very unsteady on feet with left side weakness.  She is a high fall risk.  Bed alarm on throughout shift.

## 2014-12-12 NOTE — Care Management (Signed)
Admitted to [facility] with the diagnosis of symptomatic anemia. Lives with husband, Marguerite Olea, [phone]) [employer] in the past. States she has been to another rehabilitation facility in the past, but can't remember the name. Home Health last August, can't remember name of agency. Uses a rolling walker in the home to aid in ambulation. Mo home oxygen. Husband helps her with her baths. Good appetite, no falls since December. Seen Dr. Benita Stabile 6 weeks ago. Husband will transport. Shelbie Ammons RN MSN Care Management [phone]

## 2014-12-13 LAB — CBC WITH DIFFERENTIAL/PLATELET
Basophils Absolute: 0 10*3/uL (ref 0–0.1)
Basophils Relative: 0 %
Eosinophils Absolute: 0.1 10*3/uL (ref 0–0.7)
HEMATOCRIT: 35.6 % (ref 35.0–47.0)
Hemoglobin: 10.5 g/dL — ABNORMAL LOW (ref 12.0–16.0)
Lymphocytes Relative: 9 %
Lymphs Abs: 0.8 10*3/uL — ABNORMAL LOW (ref 1.0–3.6)
MCH: 21.7 pg — ABNORMAL LOW (ref 26.0–34.0)
MCHC: 29.6 g/dL — ABNORMAL LOW (ref 32.0–36.0)
MCV: 73.2 fL — ABNORMAL LOW (ref 80.0–100.0)
MONO ABS: 0.8 10*3/uL (ref 0.2–0.9)
Neutro Abs: 7.2 10*3/uL — ABNORMAL HIGH (ref 1.4–6.5)
PLATELETS: 210 10*3/uL (ref 150–440)
RBC: 4.86 MIL/uL (ref 3.80–5.20)
RDW: 26.5 % — AB (ref 11.5–14.5)
WBC: 8.9 10*3/uL (ref 3.6–11.0)

## 2014-12-13 LAB — URINE CULTURE

## 2014-12-13 MED ORDER — GABAPENTIN 300 MG PO CAPS
300.0000 mg | ORAL_CAPSULE | Freq: Three times a day (TID) | ORAL | Status: DC
  Administered 2014-12-13 – 2014-12-16 (×12): 300 mg via ORAL
  Filled 2014-12-13 (×12): qty 1

## 2014-12-13 MED ORDER — ATENOLOL 50 MG PO TABS
50.0000 mg | ORAL_TABLET | Freq: Two times a day (BID) | ORAL | Status: DC
  Administered 2014-12-13 – 2014-12-16 (×8): 50 mg via ORAL
  Filled 2014-12-13 (×8): qty 1

## 2014-12-13 MED ORDER — SERTRALINE HCL 50 MG PO TABS
100.0000 mg | ORAL_TABLET | Freq: Two times a day (BID) | ORAL | Status: DC
  Administered 2014-12-13 – 2014-12-16 (×8): 100 mg via ORAL
  Filled 2014-12-13 (×8): qty 2

## 2014-12-13 MED ORDER — VALACYCLOVIR HCL 500 MG PO TABS
1000.0000 mg | ORAL_TABLET | Freq: Three times a day (TID) | ORAL | Status: DC
  Administered 2014-12-13 – 2014-12-16 (×12): 1000 mg via ORAL
  Filled 2014-12-13 (×12): qty 2

## 2014-12-13 MED ORDER — ATORVASTATIN CALCIUM 10 MG PO TABS
10.0000 mg | ORAL_TABLET | Freq: Every day | ORAL | Status: DC
  Administered 2014-12-13: 10:00:00 10 mg via ORAL
  Filled 2014-12-13: qty 1

## 2014-12-13 MED ORDER — LEVETIRACETAM 500 MG PO TABS
1000.0000 mg | ORAL_TABLET | Freq: Two times a day (BID) | ORAL | Status: DC
  Administered 2014-12-13 – 2014-12-16 (×8): 1000 mg via ORAL
  Filled 2014-12-13 (×8): qty 2

## 2014-12-13 MED ORDER — FERROUS SULFATE 325 (65 FE) MG PO TABS
325.0000 mg | ORAL_TABLET | Freq: Every day | ORAL | Status: DC
  Administered 2014-12-13 – 2014-12-16 (×4): 325 mg via ORAL
  Filled 2014-12-13 (×4): qty 1

## 2014-12-13 NOTE — Progress Notes (Signed)
Physical Therapy Evaluation Patient Details Name: Michele Matthews MRN: [id number] DOB: 08-16-1945 Today's Date: 12/13/2014   History of Present Illness  Patient is a 69 y.o. female admitted on 10 August for symptomatic anemia, secondary to a GI bleed. Patient has hx of CVA with L hemiplegia, HTN, COPD, CAD, and coronary artery stenosis.  Clinical Impression  Patient is a pleasant female who is HOH and is sometimes confused by questions asked. Responds well to demonstration of tasks to be performed. Patient has residual L hemiplegia but is able to demonstrate functional compensatory patterns. Patient demonstrated independence in bed mobility; however, she became anxious after performing sit to stand transfer, repeating, "I'm nervous" and sitting back on EOB. After 4 attempts, patient returned to supine position. Discussed discharge with patient who seemed open to SNF. Patient previously was a household ambulator with RW but was unable to ambulate on evaluation. Lives with husband who works first shift and prepares meals and son who previously worked third shift but is currently unemployed. Would benefit from progressive strength, balance, and gait training to return to PLOF.    Follow Up Recommendations SNF    Equipment Recommendations  None recommended by PT    Recommendations for Other Services Rehab consult     Precautions / Restrictions Precautions Precautions: Fall Restrictions Weight Bearing Restrictions: No      Mobility  Bed Mobility Overal bed mobility: Independent             General bed mobility comments: Patient demonstrates supine to sit and sit to supine with supervision.  Transfers Overall transfer level: Needs assistance Equipment used: Rolling walker (2 wheeled) Transfers: Sit to/from Stand Sit to Stand: Min assist         General transfer comment: Patient demonstrates ability to perform sit to stand and stand to sit transfer with minimal verbal cues for  safety awareness. Would not stand with upright posture, stating, "I'm nervous," and hinging at hips into forward flexion. Was unable to complete stand pivot transfer to chair after 4 attempts due to increased anxiousness.  Ambulation/Gait   Ambulation Distance (Feet): 0 Feet            Stairs            Wheelchair Mobility    Modified Rankin (Stroke Patients Only)       Balance Overall balance assessment: Needs assistance Sitting-balance support: Feet supported Sitting balance-Leahy Scale: Fair   Postural control: Posterior lean Standing balance support: Bilateral upper extremity supported (Forward lean, decreased hip extension, leaning against bed) Standing balance-Leahy Scale: Poor                               Pertinent Vitals/Pain Pain Assessment: No/denies pain    Home Living Family/patient expects to be discharged to:: Skilled nursing facility Living Arrangements: Spouse/significant other;Children;Other (Comment) (71 y.o. son) Available Help at Discharge: Family;Available PRN/intermittently Type of Home: House Home Access: Stairs to enter     Home Layout: One level Home Equipment: Environmental consultant - 2 wheels      Prior Function Level of Independence: Needs assistance   Gait / Transfers Assistance Needed: Household ambulator with RW  ADL's / Homemaking Assistance Needed: Husband performs cooking/cleaning        Hand Dominance   Dominant Hand: Right    Extremity/Trunk Assessment   Upper Extremity Assessment: Generalized weakness;LUE deficits/detail       LUE Deficits / Details: Patient demonstrates decreased  shoulder/elbow AROM and muscle strength s/p CVA. Is able to push and pull to utilize RW.   Lower Extremity Assessment: Generalized weakness;LLE deficits/detail   LLE Deficits / Details: Patient demonstrates decreased strength of L LE in comparison to R LE, 4-/5 vs. 4/5 strength grossly.  Cervical / Trunk Assessment: Kyphotic   Communication      Cognition Arousal/Alertness: Awake/alert Behavior During Therapy: Anxious Overall Cognitive Status: Within Functional Limits for tasks assessed       Memory: Decreased short-term memory              General Comments      Exercises        Assessment/Plan    PT Assessment Patient needs continued PT services  PT Diagnosis Difficulty walking;Generalized weakness   PT Problem List Decreased strength;Decreased range of motion;Decreased activity tolerance;Decreased balance;Decreased mobility;Decreased knowledge of use of DME;Decreased safety awareness  PT Treatment Interventions Gait training;Stair training;Functional mobility training;Therapeutic activities;Therapeutic exercise;Balance training;Patient/family education   PT Goals (Current goals can be found in the Care Plan section) Acute Rehab PT Goals Patient Stated Goal: "I like rehab." PT Goal Formulation: With patient Time For Goal Achievement: 12/27/14 Potential to Achieve Goals: Fair    Frequency Min 2X/week   Barriers to discharge Inaccessible home environment;Decreased caregiver support      Co-evaluation               End of Session Equipment Utilized During Treatment: Gait belt Activity Tolerance: Patient tolerated treatment well;Other (comment) (Limited by anxiety of standing/ambulating) Patient left: in bed;with call bell/phone within reach;with bed alarm set;with SCD's reapplied Nurse Communication: Mobility status         Time: 9562-1308 PT Time Calculation (min) (ACUTE ONLY): 27 min   Charges:   PT Evaluation $Initial PT Evaluation Tier I: 1 Procedure     PT G Codes:        Dorice Lamas, PT, DPT 12/13/2014, 10:54 AM

## 2014-12-13 NOTE — Plan of Care (Signed)
Problem: Discharge Progression Outcomes Goal: Other Discharge Outcomes/Goals Outcome: Progressing Plan of care progress to goal for:  1. Discharge Plan:         Plan to Discharge to Rehab, preference [facility]. 2. Pain:         Denies Pain. 3. Hemodynamically Stable:         VSS.         Remains on IV Antibiotics.         Labs Improving.           4. Complications:         Sitter at Bedside PRN. 5. Diet:         Healthy Heart. Appetite Increasing. Tolerating Well. 6. Activity:         OOB to [facility] with 1 Person Maximum Assist.         Physical Therapy Recommendation Rehab.

## 2014-12-13 NOTE — Plan of Care (Signed)
Problem: Discharge Progression Outcomes Goal: Other Discharge Outcomes/Goals Outcome: Progressing Plan of care progress to goal: Pain: tylenol given x1 for headache with improvement Hemodynamically: VSS Diet:tolerating Activity: one assist up, sitter at bedside for safety

## 2014-12-13 NOTE — Progress Notes (Signed)
[facility] [place] NAME: Michele Matthews    MR#:  [id number]  DATE OF BIRTH:  Nov 04, 1945  SUBJECTIVE:  CHIEF COMPLAINT:   Chief Complaint  Patient presents with  . Altered Mental Status   doing much better mental status improved fever resolved  REVIEW OF SYSTEMS:  Review of Systems  Constitutional: Negative for fever, weight loss, malaise/fatigue and diaphoresis.  HENT: Negative for ear discharge, ear pain, hearing loss, nosebleeds, sore throat and tinnitus.   Eyes: Negative for blurred vision and pain.  Respiratory: Negative for cough, hemoptysis, shortness of breath and wheezing.   Cardiovascular: Negative for chest pain, palpitations, orthopnea and leg swelling.  Gastrointestinal: Negative for heartburn, nausea, vomiting, abdominal pain, diarrhea, constipation and blood in stool.  Genitourinary: Negative for dysuria, urgency and frequency.  Musculoskeletal: Negative for myalgias and back pain.  Skin: Negative for itching and rash.  Neurological: Negative for dizziness, tingling, tremors, focal weakness, seizures, weakness and headaches.  Psychiatric/Behavioral: Negative for depression. The patient is not nervous/anxious.    DRUG ALLERGIES:  No Known Allergies VITALS:  Blood pressure 139/69, pulse 107, temperature 98 F (36.7 C), temperature source Oral, resp. rate 20, height 5\' 5"  (1.651 m), weight 66.225 kg (146 lb), SpO2 99 %. PHYSICAL EXAMINATION:  Physical Exam  Constitutional: She is oriented to person, place, and time and well-developed, well-nourished, and in no distress.  HENT:  Head: Normocephalic and atraumatic.  Eyes: Conjunctivae and EOM are normal. Pupils are equal, round, and reactive to light.  Neck: Normal range of motion. Neck supple. No tracheal deviation present. No thyromegaly present.  Cardiovascular: Normal rate, regular rhythm and normal heart sounds.   Pulmonary/Chest: Effort normal and breath sounds  normal. No respiratory distress. She has no wheezes. She exhibits no tenderness.  Abdominal: Soft. Bowel sounds are normal. She exhibits no distension. There is no tenderness.  Musculoskeletal: Normal range of motion.  Neurological: She is alert and oriented to person, place, and time. No cranial nerve deficit.  Skin: Skin is warm and dry. No rash noted.  Psychiatric: Mood and affect normal.   LABORATORY PANEL:   CBC  Recent Labs Lab 12/12/14 0554  WBC 8.6  HGB 9.4*  HCT 30.8*  PLT 203   ------------------------------------------------------------------------------------------------------------------ Chemistries   Recent Labs Lab 12/10/14 1108  12/12/14 0554  NA 141  < > 140  K 4.5  < > 3.5  CL 113*  < > 106  CO2 21*  < > 25  GLUCOSE 95  < > 108*  BUN 23*  < > 10  CREATININE 0.84  < > 0.55  CALCIUM 8.7*  < > 9.1  AST 20  --   --   ALT 9*  --   --   ALKPHOS 61  --   --   BILITOT 0.2*  --   --   < > = values in this interval not displayed. RADIOLOGY:  No results found. ASSESSMENT AND PLAN:   * Acute symptomatic blood loss anemia - On admission her Hb 4.0  - continue Protonix - s/p transfusion of 3 units of PRBC - Hb 9.4 EGD showed gastric ulcer with clean base and gastritis. Advance diet  * Facial weakness: no CVA, seem to have resolved. - neg MRI of the brain, appreciate neurology consult.  * UTI: per UA, continue Rocephin, urine c/s negative.   * History of coronary artery disease and hypertension Because of her GI bleed-her aspirin and her  blood pressure medications at this time.  * Smoking: Counseled to quit smoking   * Generalized weakness was so we will ask physical therapy to evaluate   All the records are reviewed and case discussed with Care Management/Social Worker. Management plans discussed with the patient, family and she is in agreement.  CODE STATUS: DO NOT RESUSCITATE  TOTAL TIME TAKING CARE OF THIS PATIENT: 35 minutes.   POSSIBLE  D/C IN 1-2 DAYS, DEPENDING ON CLINICAL CONDITION.   Dustin Flock M.D on 12/13/2014 at 12:43 PM  Between 7am to 6pm - Pager - [phone]  After 6pm go to [email or web address] - password [email or web address] [email or web address]  [place] [facility]  Office  [phone]  CC:  Primary care physician; No primary care provider on file.

## 2014-12-14 LAB — CBC
HCT: 32.8 % — ABNORMAL LOW (ref 35.0–47.0)
Hemoglobin: 9.8 g/dL — ABNORMAL LOW (ref 12.0–16.0)
MCH: 22.4 pg — AB (ref 26.0–34.0)
MCHC: 29.8 g/dL — ABNORMAL LOW (ref 32.0–36.0)
MCV: 75 fL — AB (ref 80.0–100.0)
PLATELETS: 202 10*3/uL (ref 150–440)
RBC: 4.38 MIL/uL (ref 3.80–5.20)
RDW: 26.4 % — ABNORMAL HIGH (ref 11.5–14.5)
WBC: 7.3 10*3/uL (ref 3.6–11.0)

## 2014-12-14 MED ORDER — CIPROFLOXACIN HCL 500 MG PO TABS
500.0000 mg | ORAL_TABLET | Freq: Two times a day (BID) | ORAL | Status: DC
  Administered 2014-12-14 – 2014-12-16 (×6): 500 mg via ORAL
  Filled 2014-12-14 (×6): qty 1

## 2014-12-14 NOTE — Plan of Care (Signed)
Problem: Discharge Progression Outcomes Goal: Other Discharge Outcomes/Goals Outcome: Progressing Plan of care progress to goal for:  1. Discharge Plan:         Plan to Discharge to Rehab, preference [facility]. 2. Pain:         Denies Pain. 3. Hemodynamically Stable:         VSS.         Changed from IV Antibiotics to Oral Antibiotics.          Labs Improving.            4. Complications:         No signs/symptoms of complications noted. 5. Diet:         Healthy Heart. Appetite Increasing. Tolerating Well. 6. Activity:         Physical Therapy Recommendation Rehab.

## 2014-12-14 NOTE — Clinical Social Work Note (Addendum)
Clinical Social Work Assessment  Patient Details  Name: Michele Matthews MRN: [id number] Date of Birth: 30-Jan-1946  Date of referral:  12/14/14               Reason for consult:  Facility Placement                Permission sought to share information with:  Facility Sport and exercise psychologist, Family Supports Permission granted to share information::  Yes, Verbal Permission Granted  Name::      (Husband Darrly [phone])  Agency::   (SNF)  Relationship::     Contact Information:     Housing/Transportation Living arrangements for the past 2 months:  Single Family Home Source of Information:  Patient, Spouse Patient Interpreter Needed:  None Criminal Activity/Legal Involvement Pertinent to Current Situation/Hospitalization:  No - Comment as needed Significant Relationships:  Adult Children, Spouse Lives with:  Adult Children, Spouse Do you feel safe going back to the place where you live?  Yes Need for family participation in patient care:  Yes (Comment)  Care giving concerns:  None expressed   Social Worker assessment / plan: CSW in to meet with patient to discuss SNF placement.   Patient is pleasant and minimally engaged in conversation as she is hard of hearing, husband at bedside providing most information.  Patient currently lives with her 70 year old son and her husband of 1 years.  Patient' son works 2nd shift and provides care for patient during the day while her husband works.   Patient states she is open to going the SNF for rehab as she went to [place] last summer and really liked it there.  Patient and husband in agreement with PT recommendation for SNF and would like [facility] if a bed is available. Going to SNF will assist patient with progressive strength, balance, and gait training to assist her with returning to her previous lever of functioning. CSW will complete FL2 and fax out in anticipation of discharging to SNF.   Employment status:  Disabled (Comment on whether  or not currently receiving Disability) (social security ) Insurance information:  Programmer, applications Personnel officer) PT Recommendations:  Inpatient Rehab Consult, [place] / Referral to community resources:  Acute Rehab  Patient/Family's Response to care:  Patient and husband in agreement with patient go to rehab.  Patient/Family's Understanding of and Emotional Response to Diagnosis, Current Treatment, and Prognosis:  Patient and husband understand and in agreement with patient go to rehab. Once she is discharged from the hospital.  Very appreciative of CSW assisting with the process.     Emotional Assessment Appearance:    Attitude/Demeanor/Rapport:   (appropriate) Affect (typically observed):  Accepting, Appropriate, Pleasant Orientation:  Oriented to Self, Oriented to Place, Oriented to  Time, Oriented to Situation Alcohol / Substance use:    Psych involvement (Current and /or in the community):     Discharge Needs  Concerns to be addressed:  Denies Needs/Concerns at this time Readmission within the last 30 days:  No Current discharge risk:  Physical Impairment, Chronically ill Barriers to Discharge:  Insurance Authorization (possble barrier to obtain [employer])   Laurance Flatten, Bosie Helper, LCSW 12/14/2014, 3:51 PM Casimer Lanius. Latanya Presser, MSW Clinical Social Work Department Emergency Room [phone] 3:54 PM

## 2014-12-14 NOTE — Plan of Care (Signed)
Problem: Discharge Progression Outcomes Goal: Other Discharge Outcomes/Goals Outcome: Progressing Plan of Care Progress to Goal:   Pt is alert but does not answer questions appropriately at times. Pt denies pain. Pt is resting comfortably during shift. VSS. No other signs of distress noted. Will continue to monitor.

## 2014-12-14 NOTE — Progress Notes (Signed)
[facility] [place] NAME: Michele Matthews    MR#:  [id number]  DATE OF BIRTH:  Mar 26, 1946  SUBJECTIVE:  CHIEF COMPLAINT:   Chief Complaint  Patient presents with  . Altered Mental Status  feels ok denies any cp or sob REVIEW OF SYSTEMS:  Review of Systems  Constitutional: Negative for fever, weight loss, malaise/fatigue and diaphoresis.  HENT: Negative for ear discharge, ear pain, hearing loss, nosebleeds, sore throat and tinnitus.   Eyes: Negative for blurred vision and pain.  Respiratory: Negative for cough, hemoptysis, shortness of breath and wheezing.   Cardiovascular: Negative for chest pain, palpitations, orthopnea and leg swelling.  Gastrointestinal: Negative for heartburn, nausea, vomiting, abdominal pain, diarrhea, constipation and blood in stool.  Genitourinary: Negative for dysuria, urgency and frequency.  Musculoskeletal: Negative for myalgias and back pain.  Skin: Negative for itching and rash.  Neurological: Negative for dizziness, tingling, tremors, focal weakness, seizures, weakness and headaches.  Psychiatric/Behavioral: Negative for depression. The patient is not nervous/anxious.    DRUG ALLERGIES:  No Known Allergies VITALS:  Blood pressure 128/75, pulse 83, temperature 98.1 F (36.7 C), temperature source Oral, resp. rate 18, height 5\' 5"  (1.651 m), weight 66.225 kg (146 lb), SpO2 96 %. PHYSICAL EXAMINATION:  Physical Exam  Constitutional: She is oriented to person, place, and time and well-developed, well-nourished, and in no distress.  HENT:  Head: Normocephalic and atraumatic.  Eyes: Conjunctivae and EOM are normal. Pupils are equal, round, and reactive to light.  Neck: Normal range of motion. Neck supple. No tracheal deviation present. No thyromegaly present.  Cardiovascular: Normal rate, regular rhythm and normal heart sounds.   Pulmonary/Chest: Effort normal and breath sounds normal. No respiratory  distress. She has no wheezes. She exhibits no tenderness.  Abdominal: Soft. Bowel sounds are normal. She exhibits no distension. There is no tenderness.  Musculoskeletal: Normal range of motion.  Neurological: She is alert and oriented to person, place, and time. No cranial nerve deficit.  Skin: Skin is warm and dry. No rash noted.  Psychiatric: Mood and affect normal.   LABORATORY PANEL:   CBC  Recent Labs Lab 12/14/14 0506  WBC 7.3  HGB 9.8*  HCT 32.8*  PLT 202   ------------------------------------------------------------------------------------------------------------------ Chemistries   Recent Labs Lab 12/10/14 1108  12/12/14 0554  NA 141  < > 140  K 4.5  < > 3.5  CL 113*  < > 106  CO2 21*  < > 25  GLUCOSE 95  < > 108*  BUN 23*  < > 10  CREATININE 0.84  < > 0.55  CALCIUM 8.7*  < > 9.1  AST 20  --   --   ALT 9*  --   --   ALKPHOS 61  --   --   BILITOT 0.2*  --   --   < > = values in this interval not displayed. RADIOLOGY:  No results found. ASSESSMENT AND PLAN:   * Acute symptomatic blood loss anemia - On admission her Hb 4.0  - continue Protonix - s/p transfusion of 3 units of PRBC - Hb stable EGD showed gastric ulcer with clean base and gastritis.   * Facial weakness: no CVA, seem to have resolved. - neg MRI of the brain, appreciate neurology consult.  * UTI: per UA, po abx.   * History of coronary artery disease and hypertension Because of her GI bleed-her aspirin and her blood pressure medications at this time.  *  Smoking: Counseled to quit smoking   * Generalized weakness  snf  All the records are reviewed and case discussed with Care Management/Social Worker. Management plans discussed with the patient, family and she is in agreement.  CODE STATUS: DO NOT RESUSCITATE  TOTAL TIME TAKING CARE OF THIS PATIENT:38min Dustin Flock M.D on 12/14/2014 at 10:17 AM  Between 7am to 6pm - Pager - [phone]  After 6pm go to [email or web address] -  password [email or web address] [facility]  [place] [facility]  Office  [phone]  CC:  Primary care physician; No primary care provider on file.

## 2014-12-15 MED ORDER — PANTOPRAZOLE SODIUM 40 MG IV SOLR: 40.0000 mg | Freq: Two times a day (BID) | INTRAVENOUS | Status: DC

## 2014-12-15 MED ORDER — NICOTINE 21 MG/24HR TD PT24: 21.0000 mg | MEDICATED_PATCH | Freq: Every day | TRANSDERMAL | Status: DC

## 2014-12-15 MED ORDER — CIPROFLOXACIN HCL 500 MG PO TABS: 500.0000 mg | ORAL_TABLET | Freq: Two times a day (BID) | ORAL | Status: AC

## 2014-12-15 MED ORDER — LORAZEPAM 1 MG PO TABS: 1.0000 mg | ORAL_TABLET | Freq: Three times a day (TID) | ORAL | Status: DC

## 2014-12-15 NOTE — Plan of Care (Signed)
Problem: Discharge Progression Outcomes Goal: Discharge plan in place and appropriate Outcome: Progressing Individualization of Care Pt prefers to be called Michele Matthews Hx of CVA with left side weakness, HTN, seizures, COPD, CAD, carotid artery stenosis.  Plan of care progress to goal for:  1. Discharge Plan:  Plan to d/c to rehab.  Patient would prefer [facility]. 2. Pain: Patient without complaints of pain this shift. 3. Hemodynamically Stable:  Antibiotics are PO.  Cipro given this shift per eMAR.  VSS BP 129/78 mmHg  Pulse 93  Temp(Src) 99.4 F (37.4 C) (Oral)  Resp 20  Ht 5\' 5"  (1.651 m)  Wt 146 lb (66.225 kg)  BMI 24.30 kg/m2  SpO2 97% 4.  Complications: No signs/symptoms of complications noted. 5. Diet:  Healthy Heart. Appetite Increasing. Tolerating Well. 6. Activity:  PT recommendation to rehab to rebuild strength before going to home.

## 2014-12-15 NOTE — Plan of Care (Signed)
Problem: Phase I Progression Outcomes Goal: Hemodynamically stable Outcome: Progressing No complaints of pain. VSS. Tolerating diet. Will discharge tomorrow 12/16/14.

## 2014-12-15 NOTE — Discharge Summary (Addendum)
Michele Matthews, 69 y.o., DOB 12-15-1945, MRN [id number]. Admission date: 12/10/2014 Discharge Date 12/15/2014 Primary MD No primary care provider on file. Admitting Physician Vaughan Basta, MD  Admission Diagnosis  CVA (cerebral infarction) [I63.9] Symptomatic anemia [D64.9] Altered mental status, unspecified altered mental status type [R41.82] Gastrointestinal hemorrhage, unspecified gastritis, unspecified gastrointestinal hemorrhage type [K92.2]  Discharge Diagnosis   Principal Problem: Acute encephalopathy due to uti and anemia   Symptomatic anemia suspected to due to gi bleed   Bleeding gastrointestinal   Facial weakness cva rulled out   UTI   CAD           Hospital Course Michele Matthews is a 69 y.o. female with a known history of stroke with residual left-sided weakness, hypertension, seizure, COPD, coronary artery disease, carotid artery stenosis on Coumadin for that and has been feeling progressively weak for last few weeks.  Patient was noted to have severe anemia, with hgb 4.0 receive 3 u prbc's , pt had egd which showed non bleeding gastric ulcer. This could have been the source. Pt hemoglobin remains stable, coumadin was stoped. Pt also noted to have uti which was treated. Patient very weak and recommended for rehab. Which is currently arranged.            Consults  GI  Significant Tests:  See full reports for all details      Ct Head Wo Contrast  12/10/2014   CLINICAL DATA:  Headache and confusion.  Left-sided weakness.  EXAM: CT HEAD WITHOUT CONTRAST  TECHNIQUE: Contiguous axial images were obtained from the base of the skull through the vertex without contrast.  COMPARISON:  07/01/2014 and 07/02/2014  FINDINGS: There is chronic encephalomalacia along the right MCA distribution. There is chronic encephalomalacia in the left parietal and occipital lobes. Findings compatible old infarcts. Negative for acute hemorrhage, mass lesion, midline shift, hydrocephalus or  new large infarcts. Visualized mastoid air cells and paranasal sinuses are clear. No acute bone abnormality.  IMPRESSION: No acute intracranial abnormality.  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   Electronically Signed   By: Markus Daft M.D.   On: 12/10/2014 12:16   Mr Brain Wo Contrast  12/10/2014   CLINICAL DATA:  Difficult to arouse. Multiple prior CVAs. Facial droop. CVA.  EXAM: MRI HEAD WITHOUT CONTRAST  TECHNIQUE: Multiplanar, multiecho pulse sequences of the brain and surrounding structures were obtained without intravenous contrast.  COMPARISON:  CT of the head without contrast 12/10/2014. MRI brain 07/02/2014.  FINDINGS: The diffusion-weighted images demonstrate no acute or subacute infarct. A remote hemorrhagic infarct is again seen within the posterior medial left occipital lobe. Remote nonhemorrhagic infarcts are evident within the right MCA distribution. Ex vacuo dilation of the right lateral ventricle is noted. Mild to moderate periventricular T2 changes are otherwise stable.  The study is moderately degraded by patient motion.  Flow is present in the left internal carotid artery and vertebrobasilar arteries. Abnormal signal is again noted in the right internal carotid artery with reconstitution at the level of the posterior communicating artery. The globes and orbits are intact. Paranasal sinuses and mastoid air cells are clear.  Midline structures are unremarkable.  IMPRESSION: 1. No acute intracranial abnormality. 2. Remote left PCA territory hemorrhagic infarct. 3. 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 4. Generalized atrophy and white matter disease is otherwise stable.   Electronically Signed   By: San Morelle M.D.   On: 12/10/2014 19:05   Dg Chest Port 1 View  12/10/2014  CLINICAL DATA:  Central catheter placement  EXAM: PORTABLE CHEST - 1 VIEW  COMPARISON:  July 01, 2014  FINDINGS: Central catheter tip is in the superior vena cava. No pneumothorax. There is moderate  interstitial edema. There is no appreciable airspace consolidation. There is a minimal left effusion. Heart is borderline enlarged with mild pulmonary venous hypertension. There is a hiatal hernia present.  IMPRESSION: Central catheter tip in superior vena cava. No pneumothorax. Evidence a degree of congestive heart failure. Hiatal hernia present.   Electronically Signed   By: Lowella Grip III M.D.   On: 12/10/2014 18:12       Today   Subjective:   Michele Matthews   Feels ok denies any complatins.    Objective:   Blood pressure 130/64, pulse 79, temperature 98.4 F (36.9 C), temperature source Oral, resp. rate 20, height 5\' 5"  (1.651 m), weight 66.225 kg (146 lb), SpO2 98 %.  .  Intake/Output Summary (Last 24 hours) at 12/15/14 0842 Last data filed at 12/15/14 0154  Gross per 24 hour  Intake    720 ml  Output    100 ml  Net    620 ml    Exam VITAL SIGNS: Blood pressure 130/64, pulse 79, temperature 98.4 F (36.9 C), temperature source Oral, resp. rate 20, height 5\' 5"  (1.651 m), weight 66.225 kg (146 lb), SpO2 98 %.  GENERAL:  69 y.o.-year-old patient lying in the bed with no acute distress.  EYES: Pupils equal, round, reactive to light and accommodation. No scleral icterus. Extraocular muscles intact.  HEENT: Head atraumatic, normocephalic. Oropharynx and nasopharynx clear.  NECK:  Supple, no jugular venous distention. No thyroid enlargement, no tenderness.  LUNGS: Normal breath sounds bilaterally, no wheezing, rales,rhonchi or crepitation. No use of accessory muscles of respiration.  CARDIOVASCULAR: S1, S2 normal. No murmurs, rubs, or gallops.  ABDOMEN: Soft, nontender, nondistended. Bowel sounds present. No organomegaly or mass.  EXTREMITIES: No pedal edema, cyanosis, or clubbing.  NEUROLOGIC: Cranial nerves II through XII are intact. Muscle strength 5/5 in all extremities. Sensation intact. Gait not checked.  PSYCHIATRIC: The patient is alert and oriented x 3.  SKIN: No  obvious rash, lesion, or ulcer.   Data Review     CBC w Diff: Lab Results  Component Value Date   WBC 7.3 12/14/2014   WBC 6.9 03/27/2014   HGB 9.8* 12/14/2014   HGB 12.1 03/27/2014   HCT 32.8* 12/14/2014   HCT 37.0 03/27/2014   PLT 202 12/14/2014   PLT 175 03/27/2014   LYMPHOPCT 9% 12/13/2014   LYMPHOPCT 25.4 11/25/2013   MONOPCT 9% 12/13/2014   MONOPCT 9.4 11/25/2013   EOSPCT 2% 12/13/2014   EOSPCT 2.4 11/25/2013   BASOPCT 0% 12/13/2014   BASOPCT 0.6 11/25/2013   CMP: Lab Results  Component Value Date   NA 140 12/12/2014   NA 140 03/28/2014   K 3.5 12/12/2014   K 4.0 03/28/2014   CL 106 12/12/2014   CL 110* 03/28/2014   CO2 25 12/12/2014   CO2 24 03/28/2014   BUN 10 12/12/2014   BUN 19* 03/28/2014   CREATININE 0.55 12/12/2014   CREATININE 0.71 03/28/2014   PROT 6.7 12/10/2014   PROT 6.9 03/27/2014   ALBUMIN 3.7 12/10/2014   ALBUMIN 3.4 03/27/2014   BILITOT 0.2* 12/10/2014   BILITOT 0.3 03/27/2014   ALKPHOS 61 12/10/2014   ALKPHOS 78 03/27/2014   AST 20 12/10/2014   AST 29 03/27/2014   ALT 9* 12/10/2014   ALT  15 03/27/2014  .  Micro Results Recent Results (from the past 240 hour(s))  MRSA PCR Screening     Status: None   Collection Time: 12/10/14  3:14 PM  Result Value Ref Range Status   MRSA by PCR NEGATIVE NEGATIVE Final    Comment:        The GeneXpert MRSA Assay (FDA approved for NASAL specimens only), is one component of a comprehensive MRSA colonization surveillance program. It is not intended to diagnose MRSA infection nor to guide or monitor treatment for MRSA infections.   Urine culture     Status: None   Collection Time: 12/11/14  4:30 PM  Result Value Ref Range Status   Specimen Description URINE, CLEAN CATCH  Final   Special Requests NONE  Final   Culture INSIGNIFICANT GROWTH  Final   Report Status 12/13/2014 FINAL  Final        Code Status Orders        Start     Ordered   12/12/14 1435  Do not attempt resuscitation  (DNR)   Continuous    Question Answer Comment  In the event of cardiac or respiratory ARREST Do not call a "code blue"   In the event of cardiac or respiratory ARREST Do not perform Intubation, CPR, defibrillation or ACLS   In the event of cardiac or respiratory ARREST Use medication by any route, position, wound care, and other measures to relive pain and suffering. May use oxygen, suction and manual treatment of airway obstruction as needed for comfort.      12/12/14 1434          Follow-up Information    Follow up with md snf In 7 days.      Discharge Medications     Medication List    STOP taking these medications        warfarin 5 MG tablet  Commonly known as:  COUMADIN      TAKE these medications        aspirin EC 81 MG tablet  Take 81 mg by mouth daily.     atenolol 50 MG tablet  Commonly known as:  TENORMIN  Take 50 mg by mouth 2 (two) times daily.     atorvastatin 10 MG tablet  Commonly known as:  LIPITOR  Take 10 mg by mouth daily.     ciprofloxacin 500 MG tablet  Commonly known as:  CIPRO  Take 1 tablet (500 mg total) by mouth 2 (two) times daily.     ferrous sulfate 325 (65 FE) MG tablet  Take 325 mg by mouth daily with breakfast.     gabapentin 300 MG capsule  Commonly known as:  NEURONTIN  Take 300 mg by mouth 3 (three) times daily.     levETIRAcetam 1000 MG tablet  Commonly known as:  KEPPRA  Take 1,000 mg by mouth 2 (two) times daily.     lisinopril 20 MG tablet  Commonly known as:  PRINIVIL,ZESTRIL  Take 20 mg by mouth daily as needed (blood pressure).     LORazepam 1 MG tablet  Commonly known as:  ATIVAN  Take 1 tablet (1 mg total) by mouth every 8 (eight) hours.     nicotine 21 mg/24hr patch  Commonly known as:  NICODERM CQ - dosed in mg/24 hours  Place 1 patch (21 mg total) onto the skin daily.     pantoprazole 40 MG injection  Commonly known as:  PROTONIX  Inject 40 mg into  the vein every 12 (twelve) hours.     sertraline  100 MG tablet  Commonly known as:  ZOLOFT  Take 100 mg by mouth 2 (two) times daily.     valACYclovir 500 MG tablet  Commonly known as:  VALTREX  Take 1,000 mg by mouth 3 (three) times daily.           Total Time in preparing paper work, data evaluation and todays exam - 35 minutes  Dustin Flock M.D on 12/15/2014 at 8:42 AM  [facility]   Office  [phone]

## 2014-12-15 NOTE — Progress Notes (Signed)
Pt had orders to be discharge today to snf. Pt not going to snf tonight. Notified Dr Lavetta Nielsen via telephone. Order to discharge tonight discontinued. Will continue to monitor.Jeffie Pollock, RN

## 2014-12-15 NOTE — Discharge Instructions (Signed)
DIET:  Regular diet  DISCHARGE CONDITION:  Good  ACTIVITY:  Activity as tolerated  OXYGEN:  Home Oxygen: No.   Oxygen Delivery: room air  DISCHARGE LOCATION:  nursing home    ADDITIONAL DISCHARGE INSTRUCTION:   If you experience worsening of your admission symptoms, develop shortness of breath, life threatening emergency, suicidal or homicidal thoughts you must seek medical attention immediately by calling 911 or calling your MD immediately  if symptoms less severe.  You Must read complete instructions/literature along with all the possible adverse reactions/side effects for all the Medicines you take and that have been prescribed to you. Take any new Medicines after you have completely understood and accpet all the possible adverse reactions/side effects.   Please note  You were cared for by a hospitalist during your hospital stay. If you have any questions about your discharge medications or the care you received while you were in the hospital after you are discharged, you can call the unit and asked to speak with the hospitalist on call if the hospitalist that took care of you is not available. Once you are discharged, your primary care physician will handle any further medical issues. Please note that NO REFILLS for any discharge medications will be authorized once you are discharged, as it is imperative that you return to your primary care physician (or establish a relationship with a primary care physician if you do not have one) for your aftercare needs so that they can reassess your need for medications and monitor your lab values.   Altered Mental Status Altered mental status most often refers to an abnormal change in your responsiveness and awareness. It can affect your speech, thought, mobility, memory, attention span, or alertness. It can range from slight confusion to complete unresponsiveness (coma). Altered mental status can be a sign of a serious underlying medical  condition. Rapid evaluation and medical treatment is necessary for patients having an altered mental status. CAUSES   Low blood sugar (hypoglycemia) or diabetes.  Severe loss of body fluids (dehydration) or a body salt (electrolyte) imbalance.  A stroke or other neurologic problem, such as dementia or delirium.  A head injury or tumor.  A drug or alcohol overdose.  Exposure to toxins or poisons.  Depression, anxiety, and stress.  A low oxygen level (hypoxia).  An infection.  Blood loss.  Twitching or shaking (seizure).  Heart problems, such as heart attack or heart rhythm problems (arrhythmias).  A body temperature that is too low or too high (hypothermia or hyperthermia). DIAGNOSIS  A diagnosis is based on your history, symptoms, physical and neurologic examinations, and diagnostic tests. Diagnostic tests may include:  Measurement of your blood pressure, pulse, breathing, and oxygen levels (vital signs).  Blood tests.  Urine tests.  X-ray exams.  A computerized magnetic scan (magnetic resonance imaging, MRI).  A computerized X-ray scan (computed tomography, CT scan). TREATMENT  Treatment will depend on the cause. Treatment may include:  Management of an underlying medical or mental health condition.  Critical care or support in the hospital. [place]   Only take over-the-counter or prescription medicines for pain, discomfort, or fever as directed by your caregiver.  Manage underlying conditions as directed by your caregiver.  Eat a healthy, well-balanced diet to maintain strength.  Join a support group or prevention program to cope with the condition or trauma that caused the altered mental status. Ask your caregiver to help choose a program that works for you.  Follow up with your  caregiver for further examination, therapy, or testing as directed. SEEK MEDICAL CARE IF:   You feel unwell or have chills.  You or your family notice a change  in your behavior or your alertness.  You have trouble following your caregiver's treatment plan.  You have questions or concerns. SEEK IMMEDIATE MEDICAL CARE IF:   You have a rapid heartbeat or have chest pain.  You have difficulty breathing.  You have a fever.  You have a headache with a stiff neck.  You cough up blood.  You have blood in your urine or stool.  You have severe agitation or confusion. MAKE SURE YOU:   Understand these instructions.  Will watch your condition.  Will get help right away if you are not doing well or get worse. Document Released: 10/06/2009 Document Revised: 07/11/2011 Document Reviewed: 10/06/2009 [facility] Patient Information 2015 [place], [place]. This information is not intended to replace advice given to you by your health care provider. Make sure you discuss any questions you have with your health care provider.  Anemia, Nonspecific Anemia is a condition in which the concentration of red blood cells or hemoglobin in the blood is below normal. Hemoglobin is a substance in red blood cells that carries oxygen to the tissues of the body. Anemia results in not enough oxygen reaching these tissues.  CAUSES  Common causes of anemia include:   Excessive bleeding. Bleeding may be internal or external. This includes excessive bleeding from periods (in women) or from the intestine.   Poor nutrition.   Chronic kidney, thyroid, and liver disease.  Bone marrow disorders that decrease red blood cell production.  Cancer and treatments for cancer.  HIV, AIDS, and their treatments.  Spleen problems that increase red blood cell destruction.  Blood disorders.  Excess destruction of red blood cells due to infection, medicines, and autoimmune disorders. SIGNS AND SYMPTOMS   Minor weakness.   Dizziness.   Headache.  Palpitations.   Shortness of breath, especially with exercise.   Paleness.  Cold  sensitivity.  Indigestion.  Nausea.  Difficulty sleeping.  Difficulty concentrating. Symptoms may occur suddenly or they may develop slowly.  DIAGNOSIS  Additional blood tests are often needed. These help your health care provider determine the best treatment. Your health care provider will check your stool for blood and look for other causes of blood loss.  TREATMENT  Treatment varies depending on the cause of the anemia. Treatment can include:   Supplements of iron, vitamin X83, or folic acid.   Hormone medicines.   A blood transfusion. This may be needed if blood loss is severe.   Hospitalization. This may be needed if there is significant continual blood loss.   Dietary changes.  Spleen removal. HOME CARE INSTRUCTIONS Keep all follow-up appointments. It often takes many weeks to correct anemia, and having your health care provider check on your condition and your response to treatment is very important. SEEK IMMEDIATE MEDICAL CARE IF:   You develop extreme weakness, shortness of breath, or chest pain.   You become dizzy or have trouble concentrating.  You develop heavy vaginal bleeding.   You develop a rash.   You have bloody or black, tarry stools.   You faint.   You vomit up blood.   You vomit repeatedly.   You have abdominal pain.  You have a fever or persistent symptoms for more than 2-3 days.   You have a fever and your symptoms suddenly get worse.   You are dehydrated.  MAKE SURE YOU:  Understand these instructions.  Will watch your condition.  Will get help right away if you are not doing well or get worse. Document Released: 05/26/2004 Document Revised: 12/19/2012 Document Reviewed: 10/12/2012 [facility] Patient Information 2015 [place], [place]. This information is not intended to replace advice given to you by your health care provider. Make sure you discuss any questions you have with your health care provider.

## 2014-12-15 NOTE — Care Management Important Message (Signed)
Important Message  Patient Details  Name: Michele Matthews MRN: [id number] Date of Birth: 17-Feb-1946   Medicare Important Message Given:  Yes-third notification given    Shelbie Ammons, RN 12/15/2014, 12:56 PM

## 2014-12-16 NOTE — Progress Notes (Signed)
Physical Therapy Treatment Patient Details Name: Michele Matthews MRN: [id number] DOB: Mar 25, 1946 Today's Date: 12/16/2014    History of Present Illness Patient is a 69 y.o. female admitted on 10 August for symptomatic anemia, secondary to a GI bleed. Patient has hx of CVA with L hemiplegia, HTN, COPD, CAD, and coronary artery stenosis.    PT Comments    Pt willing to participate in physical therapy but still appears anxious with mobility d/t fear of falling.  Pt continues to have difficulty coming to full stand with 1 assist and RW.  Continue to recommend pt discharge to STR when medically appropriate.  Follow Up Recommendations  SNF     Equipment Recommendations       Recommendations for Other Services       Precautions / Restrictions Precautions Precautions: Fall Restrictions Weight Bearing Restrictions: No    Mobility  Bed Mobility Overal bed mobility: Needs Assistance Bed Mobility: Supine to Sit;Sit to Supine     Supine to sit: Min assist Sit to supine: Mod assist   General bed mobility comments: pt with posterior L lean upon sitting requiring assist to reposition (pt then SBA)  Transfers Overall transfer level: Needs assistance Equipment used: Rolling walker (2 wheeled) Transfers: Sit to/from Stand Sit to Stand: Max assist         General transfer comment: trialed standing x4 attempts; pt's 1st trial pt's feet sliding forward; attempted to stabilize pt's feet but pt still with difficulty standing 2nd, 3rd &4th trial (pt with 3/4 stand)  Ambulation/Gait             General Gait Details: Not appropriate at this time   Stairs            Wheelchair Mobility    Modified Rankin (Stroke Patients Only)       Balance Overall balance assessment: Needs assistance Sitting-balance support: Bilateral upper extremity supported Sitting balance-Leahy Scale: Poor   Postural control: Posterior lean;Left lateral lean   Standing balance-Leahy Scale:  Poor                      Cognition Arousal/Alertness: Awake/alert Behavior During Therapy: Anxious Overall Cognitive Status: Difficult to assess (d/t anxiousness)       Memory: Decreased short-term memory              Exercises      General Comments   Nursing cleared pt for participation in physical therapy.  Pt agreeable to PT session.  Pt's husband present during session.      Pertinent Vitals/Pain Pain Assessment: No/denies pain  Vitals stable and WFL throughout treatment session.    Home Living                      Prior Function            PT Goals (current goals can now be found in the care plan section) Acute Rehab PT Goals Patient Stated Goal: to go to rehab PT Goal Formulation: With patient Time For Goal Achievement: 12/27/14 Potential to Achieve Goals: Good Progress towards PT goals: Progressing toward goals    Frequency  Min 2X/week    PT Plan Current plan remains appropriate    Co-evaluation             End of Session Equipment Utilized During Treatment: Gait belt Activity Tolerance: Other (comment) (Limited by anxiety with mobility) Patient left: in bed;with call bell/phone within reach;with bed alarm set;with family/visitor  present;with SCD's reapplied     Time: 1155-1210 PT Time Calculation (min) (ACUTE ONLY): 15 min  Charges:  $Therapeutic Activity: 8-22 mins                    G CodesLeitha Bleak January 08, 2015, 1:01 PM Leitha Bleak, [place]

## 2014-12-16 NOTE — Progress Notes (Signed)
[place] [facility] [place] NAME: Rudine Rieger    MR#:  [id number]  DATE OF BIRTH:  [birth year]  SUBJECTIVE:  CHIEF COMPLAINT:   Chief Complaint  Patient presents with  . Altered Mental Status   doing well await snf discharge  REVIEW OF SYSTEMS:  Review of Systems  Constitutional: Negative for fever, weight loss, malaise/fatigue and diaphoresis.  HENT: Negative for ear discharge, ear pain, hearing loss, nosebleeds, sore throat and tinnitus.   Eyes: Negative for blurred vision and pain.  Respiratory: Negative for cough, hemoptysis, shortness of breath and wheezing.   Cardiovascular: Negative for chest pain, palpitations, orthopnea and leg swelling.  Gastrointestinal: Negative for heartburn, nausea, vomiting, abdominal pain, diarrhea, constipation and blood in stool.  Genitourinary: Negative for dysuria, urgency and frequency.  Musculoskeletal: Negative for myalgias and back pain.  Skin: Negative for itching and rash.  Neurological: Negative for dizziness, tingling, tremors, focal weakness, seizures, weakness and headaches.  Psychiatric/Behavioral: Negative for depression. The patient is not nervous/anxious.    DRUG ALLERGIES:  No Known Allergies VITALS:  Blood pressure 142/73, pulse 78, temperature 98.2 F (36.8 C), temperature source Oral, resp. rate 22, height 5\' 5"  (1.651 m), weight 66.225 kg (146 lb), SpO2 97 %. PHYSICAL EXAMINATION:  Physical Exam  Constitutional: She is oriented to person, place, and time and well-developed, well-nourished, and in no distress.  HENT:  Head: Normocephalic and atraumatic.  Eyes: Conjunctivae and EOM are normal. Pupils are equal, round, and reactive to light.  Neck: Normal range of motion. Neck supple. No tracheal deviation present. No thyromegaly present.  Cardiovascular: Normal rate, regular rhythm and normal heart sounds.   Pulmonary/Chest: Effort normal and breath sounds normal. No respiratory  distress. She has no wheezes. She exhibits no tenderness.  Abdominal: Soft. Bowel sounds are normal. She exhibits no distension. There is no tenderness.  Musculoskeletal: Normal range of motion.  Neurological: She is alert and oriented to person, place, and time. No cranial nerve deficit.  Skin: Skin is warm and dry. No rash noted.  Psychiatric: Mood and affect normal.   LABORATORY PANEL:   CBC  Recent Labs Lab 12/14/14 0506  WBC 7.3  HGB 9.8*  HCT 32.8*  PLT 202   ------------------------------------------------------------------------------------------------------------------ Chemistries   Recent Labs Lab 12/10/14 1108  12/12/14 0554  NA 141  < > 140  K 4.5  < > 3.5  CL 113*  < > 106  CO2 21*  < > 25  GLUCOSE 95  < > 108*  BUN 23*  < > 10  CREATININE 0.84  < > 0.55  CALCIUM 8.7*  < > 9.1  AST 20  --   --   ALT 9*  --   --   ALKPHOS 61  --   --   BILITOT 0.2*  --   --   < > = values in this interval not displayed. RADIOLOGY:  No results found. ASSESSMENT AND PLAN:   * Acute symptomatic blood loss anemia - On admission her Hb 4.0 now stable - continue Protonix - s/p transfusion of 3 units of PRBC - Hb stable EGD showed gastric ulcer with clean base and gastritis.   * Facial weakness: no CVA, seem to have resolved. - neg MRI of the brain, appreciate neurology consult.  * UTI: per UA, po abx.   * History of coronary artery disease and hypertension Because of her GI bleed-her aspirin and her blood pressure medications at this time.  *  Smoking: Counseled to quit smoking   * Generalized weakness  snf awaut bed  All the records are reviewed and case discussed with Care Management/Social Worker. Management plans discussed with the patient, family and she is in agreement.  CODE STATUS: DO NOT RESUSCITATE  TOTAL TIME TAKING CARE OF THIS PATIENT:51min Dustin Flock M.D on 12/16/2014 at 8:45 AM  Between 7am to 6pm - Pager - [phone]  After 6pm go to  [email or web address] - password [email or web address] [facility]  [place] [facility]  Office  [phone]  CC:  Primary care physician; No primary care provider on file.

## 2014-12-16 NOTE — Plan of Care (Signed)
Problem: Discharge Progression Outcomes Goal: Discharge plan in place and appropriate Outcome: Progressing Individualization of Care Pt prefers to be called Michele Matthews Hx of CVA with left side weakness, HTN, seizures, COPD, CAD, carotid artery stenosis.  Plan of care progress to goal for:  1. Discharge Plan: Plan to d/c to rehab. Patient would prefer [facility]. 2. Pain: Patient without complaints of pain this shift. 3. Hemodynamically Stable: BP 118/69 mmHg  Pulse 77  Temp(Src) 97.8 F (36.6 C) (Oral)  Resp 22  Ht 5\' 5"  (1.651 m)  Wt 146 lb (66.225 kg)  BMI 24.30 kg/m2  SpO2 96% 4. Complications: No signs/symptoms of complications noted. 5. Diet: Healthy Heart. Appetite Increasing. Tolerating Well. 6. Activity: PT recommendation to rehab to rebuild strength before going to home.  SNF recommendation - possible D/C today 12/16/14 if insurance approved.

## 2014-12-16 NOTE — Progress Notes (Signed)
CSW left message with [facility] case manager looking for status of review. CSW also spoke to PT to let them know [employer] will likely ask for a PT note from today. CSW will continue to follow and update tx team with progress.  Toma Copier, [place]

## 2014-12-16 NOTE — Progress Notes (Signed)
Pt has SNF bed at [facility] at dc.  Waiting on preauthorization from [facility]. Pt and her husband were updated on the process. CSW will keep tx team posted as we hear from the insurance company.   Toma Copier, LCSW

## 2014-12-16 NOTE — Progress Notes (Signed)
Patient discharged to [facility]. VSS. Report called to Bill at facility. Will call EMS for transport.

## 2015-04-07 ENCOUNTER — Other Ambulatory Visit: Payer: Self-pay | Admitting: Internal Medicine

## 2015-04-07 DIAGNOSIS — Z1231 Encounter for screening mammogram for malignant neoplasm of breast: Secondary | ICD-10-CM

## 2015-04-21 ENCOUNTER — Ambulatory Visit: Payer: Medicare HMO | Attending: Internal Medicine

## 2015-06-19 ENCOUNTER — Encounter: Payer: Self-pay | Admitting: Family Medicine

## 2015-06-19 ENCOUNTER — Ambulatory Visit (INDEPENDENT_AMBULATORY_CARE_PROVIDER_SITE_OTHER): Payer: Medicare HMO | Admitting: Family Medicine

## 2015-06-19 VITALS — BP 160/72 | HR 85 | Temp 97.4°F | Resp 16 | Ht 66.0 in | Wt 119.0 lb

## 2015-06-19 DIAGNOSIS — F329 Major depressive disorder, single episode, unspecified: Secondary | ICD-10-CM | POA: Insufficient documentation

## 2015-06-19 DIAGNOSIS — I10 Essential (primary) hypertension: Secondary | ICD-10-CM

## 2015-06-19 DIAGNOSIS — J449 Chronic obstructive pulmonary disease, unspecified: Secondary | ICD-10-CM | POA: Insufficient documentation

## 2015-06-19 DIAGNOSIS — E785 Hyperlipidemia, unspecified: Secondary | ICD-10-CM | POA: Diagnosis not present

## 2015-06-19 DIAGNOSIS — R569 Unspecified convulsions: Secondary | ICD-10-CM | POA: Insufficient documentation

## 2015-06-19 DIAGNOSIS — G8194 Hemiplegia, unspecified affecting left nondominant side: Secondary | ICD-10-CM | POA: Diagnosis not present

## 2015-06-19 DIAGNOSIS — J432 Centrilobular emphysema: Secondary | ICD-10-CM | POA: Diagnosis not present

## 2015-06-19 DIAGNOSIS — Z7901 Long term (current) use of anticoagulants: Secondary | ICD-10-CM | POA: Diagnosis not present

## 2015-06-19 DIAGNOSIS — I2510 Atherosclerotic heart disease of native coronary artery without angina pectoris: Secondary | ICD-10-CM | POA: Insufficient documentation

## 2015-06-19 DIAGNOSIS — Z1239 Encounter for other screening for malignant neoplasm of breast: Secondary | ICD-10-CM

## 2015-06-19 DIAGNOSIS — F419 Anxiety disorder, unspecified: Secondary | ICD-10-CM | POA: Insufficient documentation

## 2015-06-19 DIAGNOSIS — Z720 Tobacco use: Secondary | ICD-10-CM | POA: Diagnosis not present

## 2015-06-19 DIAGNOSIS — F418 Other specified anxiety disorders: Secondary | ICD-10-CM

## 2015-06-19 DIAGNOSIS — K219 Gastro-esophageal reflux disease without esophagitis: Secondary | ICD-10-CM | POA: Insufficient documentation

## 2015-06-19 DIAGNOSIS — H5347 Heteronymous bilateral field defects: Secondary | ICD-10-CM | POA: Diagnosis not present

## 2015-06-19 DIAGNOSIS — F32A Depression, unspecified: Secondary | ICD-10-CM | POA: Insufficient documentation

## 2015-06-19 MED ORDER — PREDNISONE 20 MG PO TABS: 40.0000 mg | ORAL_TABLET | Freq: Every day | ORAL | Status: DC

## 2015-06-19 MED ORDER — ALBUTEROL SULFATE HFA 108 (90 BASE) MCG/ACT IN AERS: 2.0000 | INHALATION_SPRAY | Freq: Four times a day (QID) | RESPIRATORY_TRACT | Status: DC | PRN

## 2015-06-19 NOTE — Assessment & Plan Note (Signed)
Continue current dosing of warfarin. Records being requested. Next PT INR due on 2/25. Goal 2-3.

## 2015-06-19 NOTE — Assessment & Plan Note (Signed)
Stable from old stroke. Records being requested for review.

## 2015-06-19 NOTE — Patient Instructions (Signed)
I think you have a COPD exacerbation- please use albuterol inhaler 4times daily as needed for chest tightness and wheezing. Please take prednisone 2 tablets once daily for 5 days to help with breathing.  Please get your INR done on 2/25.   Please take Lisinopril once daily.   Please decrease your lorazepam dose to once daily. CContinue to take zoloft as written by Dr. Hall Busing.   Please seek immediate medical attention at ER or Urgent Care if you develop: Chest pain, pressure or tightness. Shortness of breath accompanied by nausea or diaphoresis Visual changes Numbness or tingling on one side of the body Facial droop Altered mental status Or any concerning symptoms.  Please seek immediate medical attention if you develop shortness of breath not relieve by inhaler, chest pain/tightness, fever > 103 F or other concerning symptoms.

## 2015-06-19 NOTE — Assessment & Plan Note (Signed)
From previous stroke. Records being requested for review.

## 2015-06-19 NOTE — Assessment & Plan Note (Signed)
Reviewed risks of lorazepam in older adults. Would like to try to taper off. Goal to take only once per day. Continue sertraline dosing as ordered.

## 2015-06-19 NOTE — Assessment & Plan Note (Signed)
Unclear if long term. Pt needs to see neurology for management to determine if keppra is still needed. Check Keppra levels.

## 2015-06-19 NOTE — Progress Notes (Signed)
Subjective:    Patient ID: [name], female    DOB: [birth year], 70 y.o.   MRN: [id number]  HPI: Michele Matthews is a 70 y.o. female presenting on 06/19/2015 for Establish Care   HPI  Pt presents to establish care today. Previous care provider was Dr. Hall Busing.  It has been 1 months since Her last PCP visit. Records from previous provider will be requested and reviewed. Current medical problems include:  Coumadin: Been on since her first stroke 2002. 5mg  coumadin - 1/2 M,W,F,S, 5 mg on T,Th. Last INR was 1/25. Hypertension: Diagnosed >10 years ago. Taking atenolol 50mg  BID, Lisinopril 20mg - only takes if systolic > [id number]. Takes twice per week.  Seizures: Had all her life. On Keppra- had siezures in the hospital. Was previously managed by Dr. Hall Busing has never seen neurology.  Stroke- 3 strokes- CVA. 3 more TIA's. L sided weakness. R eye vision change- blurry.  Last year vision changed with mini stroke.  COPD: Found in hospital. No rescue inhaler.No SOB. Current rattling cough- clear sputum. No normal cough. No chest tightness. No dyspnea.  Depression: Takes Zoloft once daily 100mg - taking twice per day. Taking lorazepam for her anxiety- 1mg  daily. Was off for a few months - doing fine per son. Then restarted by Dr. Hall Busing. Takes in the AM and PM.  Hyperlipidemia: 40 daily- no myalgias.  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   Rash- back- itchy and excoriated.    Health maintenance:  Last colonoscopy: August 2016. Normal. Upper GI- Aug 2016.  Mammogram:  Current tobacco use: Currently smoking 1 pack per day.     Past Medical History  Diagnosis Date  . Stroke ([place])   . Hypertension   . Seizures ([place])   . COPD (chronic obstructive pulmonary disease) ([place])   . Coronary artery disease   . Allergy   . Depression   . Emphysema of lung ([place])   . GERD (gastroesophageal reflux disease)   . Hyperlipidemia    Social  History   Social History  . Marital Status: Married    Spouse Name: N/A  . Number of Children: N/A  . Years of Education: N/A   Occupational History  . Not on file.   Social History Main Topics  . Smoking status: Current Every Day Smoker -- 1.00 packs/day  . Smokeless tobacco: Never Used  . Alcohol Use: No  . Drug Use: No  . Sexual Activity: Not Currently   Other Topics Concern  . Not on file   Social History Narrative   Family History  Problem Relation Age of Onset  . Alzheimer's disease Father   . Lung cancer Sister   . Breast cancer Sister   . Cancer Mother    Current Outpatient Prescriptions on File Prior to Visit  Medication Sig  . aspirin EC 81 MG tablet Take 81 mg by mouth daily.  Marland Kitchen atenolol (TENORMIN) 50 MG tablet Take 50 mg by mouth 2 (two) times daily.  Marland Kitchen atorvastatin (LIPITOR) 10 MG tablet Take 10 mg by mouth daily.  . ferrous sulfate 325 (65 FE) MG tablet Take 325 mg by mouth daily with breakfast.  . gabapentin (NEURONTIN) 300 MG capsule Take 300 mg by mouth 3 (three) times daily.  Marland Kitchen levETIRAcetam (KEPPRA) 1000 MG tablet Take 1,000 mg by mouth 2 (two) times daily.  Marland Kitchen lisinopril (PRINIVIL,ZESTRIL) 20 MG tablet Take 20 mg by mouth daily as needed (  blood pressure).  . LORazepam (ATIVAN) 1 MG tablet Take 1 tablet (1 mg total) by mouth every 8 (eight) hours.  . sertraline (ZOLOFT) 100 MG tablet Take 100 mg by mouth 2 (two) times daily.  . valACYclovir (VALTREX) 500 MG tablet Take 1,000 mg by mouth 3 (three) times daily.   No current facility-administered medications on file prior to visit.    Review of Systems  Constitutional: Negative for fever and chills.  HENT: Negative.  Negative for congestion, postnasal drip, rhinorrhea, sinus pressure and trouble swallowing.   Respiratory: Positive for cough, chest tightness and wheezing.   Cardiovascular: Negative for chest pain and leg swelling.  Gastrointestinal: Negative for nausea, vomiting, abdominal pain,  diarrhea and constipation.  Endocrine: Negative.  Negative for cold intolerance, heat intolerance, polydipsia, polyphagia and polyuria.  Genitourinary: Negative for dysuria and difficulty urinating.  Musculoskeletal: Negative.   Skin: Positive for wound.  Neurological: Positive for numbness (paresthesia along old shingles site. ). Negative for dizziness and light-headedness.  Psychiatric/Behavioral: Positive for dysphoric mood. Negative for suicidal ideas and decreased concentration. The patient is nervous/anxious.    Per HPI unless specifically indicated above     Objective:    BP 160/72 mmHg  Pulse 85  Temp(Src) 97.4 F (36.3 C) (Oral)  Resp 16  Ht 5\' 6"  (1.676 m)  Wt 119 lb (53.978 kg)  BMI 19.22 kg/m2  Wt Readings from Last 3 Encounters:  06/19/15 119 lb (53.978 kg)  12/10/14 146 lb (66.225 kg)  12/10/14 146 lb (66.225 kg)    Physical Exam  Constitutional: She is oriented to person, place, and time. She appears well-developed and well-nourished.  HENT:  Head: Normocephalic and atraumatic.  Neck: Neck supple.  Cardiovascular: Normal rate, regular rhythm and normal heart sounds.  Exam reveals no gallop and no friction rub.   No murmur heard. Pulmonary/Chest: Effort normal. No tachypnea. She has no decreased breath sounds. She has wheezes (expiratory. ) in the right middle field, the right lower field, the left middle field and the left lower field. She has no rhonchi. She has no rales. Chest wall is not dull to percussion. She exhibits no tenderness.  Abdominal: Soft. Normal appearance and bowel sounds are normal. She exhibits no distension and no mass. There is no tenderness. There is no rebound and no guarding.  Musculoskeletal: Normal range of motion. She exhibits no edema or tenderness.  Lymphadenopathy:    She has no cervical adenopathy.  Neurological: She is alert and oriented to person, place, and time. No cranial nerve deficit or sensory deficit.  Burning pain  described from neck to shoulder along old shingles site.   Skin: Skin is warm and dry.     Psychiatric: Her speech is normal and behavior is normal. Judgment and thought content normal. Her mood appears anxious. Cognition and memory are normal.   Results for orders placed or performed during the hospital encounter of 12/10/14  MRSA PCR Screening  Result Value Ref Range   MRSA by PCR NEGATIVE NEGATIVE  Urine culture  Result Value Ref Range   Specimen Description URINE, CLEAN CATCH    Special Requests NONE    Culture INSIGNIFICANT GROWTH    Report Status 12/13/2014 FINAL   Glucose, capillary  Result Value Ref Range   Glucose-Capillary 101 (H) 65 - 99 mg/dL   Comment 1 Notify RN   Urinalysis complete, with microscopic  Result Value Ref Range   Color, Urine COLORLESS (A) YELLOW   APPearance CLEAR (A) CLEAR  Glucose, UA NEGATIVE NEGATIVE mg/dL   Bilirubin Urine NEGATIVE NEGATIVE   Ketones, ur NEGATIVE NEGATIVE mg/dL   Specific Gravity, Urine 1.004 (L) 1.005 - 1.030   Hgb urine dipstick 1+ (A) NEGATIVE   pH 6.0 5.0 - 8.0   Protein, ur NEGATIVE NEGATIVE mg/dL   Nitrite NEGATIVE NEGATIVE   Leukocytes, UA 2+ (A) NEGATIVE   RBC / HPF 0-5 0 - 5 RBC/hpf   WBC, UA TOO NUMEROUS TO COUNT 0 - 5 WBC/hpf   Bacteria, UA NONE SEEN NONE SEEN   Squamous Epithelial / LPF 0-5 (A) NONE SEEN  Comprehensive metabolic panel  Result Value Ref Range   Sodium 141 135 - 145 mmol/L   Potassium 4.5 3.5 - 5.1 mmol/L   Chloride 113 (H) 101 - 111 mmol/L   CO2 21 (L) 22 - 32 mmol/L   Glucose, Bld 95 65 - 99 mg/dL   BUN 23 (H) 6 - 20 mg/dL   Creatinine, Ser 0.84 0.44 - 1.00 mg/dL   Calcium 8.7 (L) 8.9 - 10.3 mg/dL   Total Protein 6.7 6.5 - 8.1 g/dL   Albumin 3.7 3.5 - 5.0 g/dL   AST 20 15 - 41 U/L   ALT 9 (L) 14 - 54 U/L   Alkaline Phosphatase 61 38 - 126 U/L   Total Bilirubin 0.2 (L) 0.3 - 1.2 mg/dL   GFR calc non Af Amer >60 >60 mL/min   GFR calc Af Amer >60 >60 mL/min   Anion gap 7 5 - 15    Lipase, blood  Result Value Ref Range   Lipase 31 22 - 51 U/L  Troponin I  Result Value Ref Range   Troponin I <0.03 <0.031 ng/mL  Lactic acid, plasma  Result Value Ref Range   Lactic Acid, Venous 1.2 0.5 - 2.0 mmol/L  CBC with Differential  Result Value Ref Range   WBC 6.5 3.6 - 11.0 K/uL   RBC 2.32 (L) 3.80 - 5.20 MIL/uL   Hemoglobin 4.0 (LL) 12.0 - 15.0 g/dL   HCT 15.6 (L) 35.0 - 47.0 %   MCV 67.2 (L) 80.0 - 100.0 fL   MCH 17.1 (L) 26.0 - 34.0 pg   MCHC 25.5 (L) 32.0 - 36.0 g/dL   RDW 21.6 (H) 11.5 - 14.5 %   Platelets 237 150 - 440 K/uL   Neutrophils Relative % 78% %   Neutro Abs 5.1 1.4 - 6.5 K/uL   Lymphocytes Relative 15% %   Lymphs Abs 1.0 1.0 - 3.6 K/uL   Monocytes Relative 5% %   Monocytes Absolute 0.4 0.2 - 0.9 K/uL   Eosinophils Relative 1% %   Eosinophils Absolute 0.1 0 - 0.7 K/uL   Basophils Relative 1% %   Basophils Absolute 0.0 0 - 0.1 K/uL  Protime-INR  Result Value Ref Range   Prothrombin Time 25.9 (H) 11.4 - 15.0 seconds   INR 2.36   Glucose, capillary  Result Value Ref Range   Glucose-Capillary 90 65 - 99 mg/dL  Basic metabolic panel  Result Value Ref Range   Sodium 140 135 - 145 mmol/L   Potassium 3.7 3.5 - 5.1 mmol/L   Chloride 105 101 - 111 mmol/L   CO2 23 22 - 32 mmol/L   Glucose, Bld 109 (H) 65 - 99 mg/dL   BUN 15 6 - 20 mg/dL   Creatinine, Ser 0.70 0.44 - 1.00 mg/dL   Calcium 9.3 8.9 - 10.3 mg/dL   GFR calc non Af Amer >60 >60  mL/min   GFR calc Af Amer >60 >60 mL/min   Anion gap 12 5 - 15  CBC  Result Value Ref Range   WBC 11.0 3.6 - 11.0 K/uL   RBC 4.16 3.80 - 5.20 MIL/uL   Hemoglobin 9.2 (L) 12.0 - 16.0 g/dL   HCT 30.2 (L) 35.0 - 47.0 %   MCV 72.7 (L) 80.0 - 100.0 fL   MCH 22.2 (L) 26.0 - 34.0 pg   MCHC 30.5 (L) 32.0 - 36.0 g/dL   RDW 24.4 (H) 11.5 - 14.5 %   Platelets 225 150 - 440 K/uL  Glucose, capillary  Result Value Ref Range   Glucose-Capillary 133 (H) 65 - 99 mg/dL  CBC  Result Value Ref Range   WBC 8.6 3.6 - 11.0  K/uL   RBC 4.26 3.80 - 5.20 MIL/uL   Hemoglobin 9.4 (L) 12.0 - 16.0 g/dL   HCT 30.8 (L) 35.0 - 47.0 %   MCV 72.3 (L) 80.0 - 100.0 fL   MCH 22.0 (L) 26.0 - 34.0 pg   MCHC 30.5 (L) 32.0 - 36.0 g/dL   RDW 24.9 (H) 11.5 - 14.5 %   Platelets 203 150 - 440 K/uL  Basic metabolic panel  Result Value Ref Range   Sodium 140 135 - 145 mmol/L   Potassium 3.5 3.5 - 5.1 mmol/L   Chloride 106 101 - 111 mmol/L   CO2 25 22 - 32 mmol/L   Glucose, Bld 108 (H) 65 - 99 mg/dL   BUN 10 6 - 20 mg/dL   Creatinine, Ser 0.55 0.44 - 1.00 mg/dL   Calcium 9.1 8.9 - 10.3 mg/dL   GFR calc non Af Amer >60 >60 mL/min   GFR calc Af Amer >60 >60 mL/min   Anion gap 9 5 - 15  CBC with Differential/Platelet  Result Value Ref Range   WBC 8.9 3.6 - 11.0 K/uL   RBC 4.86 3.80 - 5.20 MIL/uL   Hemoglobin 10.5 (L) 12.0 - 16.0 g/dL   HCT 35.6 35.0 - 47.0 %   MCV 73.2 (L) 80.0 - 100.0 fL   MCH 21.7 (L) 26.0 - 34.0 pg   MCHC 29.6 (L) 32.0 - 36.0 g/dL   RDW 26.5 (H) 11.5 - 14.5 %   Platelets 210 150 - 440 K/uL   Neutrophils Relative % 80% %   Neutro Abs 7.2 (H) 1.4 - 6.5 K/uL   Lymphocytes Relative 9% %   Lymphs Abs 0.8 (L) 1.0 - 3.6 K/uL   Monocytes Relative 9% %   Monocytes Absolute 0.8 0.2 - 0.9 K/uL   Eosinophils Relative 2% %   Eosinophils Absolute 0.1 0 - 0.7 K/uL   Basophils Relative 0% %   Basophils Absolute 0.0 0 - 0.1 K/uL  CBC  Result Value Ref Range   WBC 7.3 3.6 - 11.0 K/uL   RBC 4.38 3.80 - 5.20 MIL/uL   Hemoglobin 9.8 (L) 12.0 - 16.0 g/dL   HCT 32.8 (L) 35.0 - 47.0 %   MCV 75.0 (L) 80.0 - 100.0 fL   MCH 22.4 (L) 26.0 - 34.0 pg   MCHC 29.8 (L) 32.0 - 36.0 g/dL   RDW 26.4 (H) 11.5 - 14.5 %   Platelets 202 150 - 440 K/uL  Type and screen for Red Blood Exchange  Result Value Ref Range   ABO/RH(D) O NEG    Antibody Screen NEG    Sample Expiration 12/13/2014    Unit Number [id number]    Blood Component Type  RED CELLS,LR    Unit division 00    Status of Unit ISSUED,FINAL    Transfusion Status  OK TO TRANSFUSE    Crossmatch Result Compatible    Unit Number [id number]    Blood Component Type RED CELLS,LR    Unit division 00    Status of Unit ISSUED,FINAL    Transfusion Status OK TO TRANSFUSE    Crossmatch Result Compatible    Unit Number [id number]    Blood Component Type RCLI PHER 1    Unit division 00    Status of Unit ISSUED,FINAL    Transfusion Status OK TO TRANSFUSE    Crossmatch Result Compatible   Prepare RBC  Result Value Ref Range   Order Confirmation ORDER PROCESSED BY BLOOD BANK   ABO/Rh  Result Value Ref Range   ABO/RH(D) O NEG       Assessment & Plan:   Problem List Items Addressed This Visit      Cardiovascular and Mediastinum   BP (high blood pressure)   Relevant Medications   warfarin (COUMADIN) 5 MG tablet   Other Relevant Orders   Comprehensive metabolic panel   Lipid panel     Respiratory   Chronic obstructive pulmonary disease (HCC)   Relevant Medications   predniSONE (DELTASONE) 20 MG tablet   albuterol (PROVENTIL HFA;VENTOLIN HFA) 108 (90 Base) MCG/ACT inhaler     Digestive   Acid reflux     Nervous and Auditory   Hemiparesis, left (HCC)    Stable from old stroke. Records being requested for review.         Other   Hemianopia    From previous stroke. Records being requested for review.       HLD (hyperlipidemia)    Check lipid panel today. Continue current meds.       Relevant Medications   warfarin (COUMADIN) 5 MG tablet   Current tobacco use    Encouraged pt to quit smoking.       Long term current use of anticoagulant therapy    Continue current dosing of warfarin. Records being requested. Next PT INR due on 2/25. Goal 2-3.       Relevant Orders   INR/PT   Seizure ([place])    Unclear if long term. Pt needs to see neurology for management to determine if keppra is still needed. Check Keppra levels.        Relevant Orders   Levetiracetam level   Anxiety and depression    Reviewed risks of lorazepam in  older adults. Would like to try to taper off. Goal to take only once per day. Continue sertraline dosing as ordered.       Relevant Orders   TSH   VITAMIN D 25 Hydroxy (Vit-D Deficiency, Fractures)    Other Visit Diagnoses    Screening for breast cancer    -  Primary    Mammogram ordered.     Relevant Orders    MM Digital Screening       Meds ordered this encounter  Medications  . warfarin (COUMADIN) 5 MG tablet    Sig: Take 5 mg by mouth daily. 1/2 table on M,W,F,S 1 tablet on S,Th  . predniSONE (DELTASONE) 20 MG tablet    Sig: Take 2 tablets (40 mg total) by mouth daily with breakfast.    Dispense:  10 tablet    Refill:  0    Order Specific Question:  Supervising Provider    Answer:  Dicky Doe  H [phone]  . albuterol (PROVENTIL HFA;VENTOLIN HFA) 108 (90 Base) MCG/ACT inhaler    Sig: Inhale 2 puffs into the lungs every 6 (six) hours as needed for wheezing or shortness of breath.    Dispense:  1 Inhaler    Refill:  11    Order Specific Question:  Supervising Provider    Answer:  Arlis Porta [phone]      Follow up plan: Return in about 4 weeks (around 07/17/2015) for depression and BP .

## 2015-06-19 NOTE — Assessment & Plan Note (Signed)
Check lipid panel today. Continue current meds.

## 2015-06-19 NOTE — Assessment & Plan Note (Signed)
Encouraged pt to quit smoking

## 2015-07-31 ENCOUNTER — Ambulatory Visit: Payer: Medicare HMO | Admitting: Family Medicine

## 2015-08-07 ENCOUNTER — Ambulatory Visit (INDEPENDENT_AMBULATORY_CARE_PROVIDER_SITE_OTHER): Payer: Medicare HMO | Admitting: Family Medicine

## 2015-08-07 ENCOUNTER — Encounter: Payer: Self-pay | Admitting: Family Medicine

## 2015-08-07 VITALS — BP 126/78 | HR 87 | Temp 97.7°F | Resp 16 | Ht 66.0 in | Wt 118.0 lb

## 2015-08-07 DIAGNOSIS — F418 Other specified anxiety disorders: Secondary | ICD-10-CM | POA: Diagnosis not present

## 2015-08-07 DIAGNOSIS — J432 Centrilobular emphysema: Secondary | ICD-10-CM

## 2015-08-07 DIAGNOSIS — Z720 Tobacco use: Secondary | ICD-10-CM

## 2015-08-07 DIAGNOSIS — E785 Hyperlipidemia, unspecified: Secondary | ICD-10-CM | POA: Diagnosis not present

## 2015-08-07 DIAGNOSIS — I10 Essential (primary) hypertension: Secondary | ICD-10-CM

## 2015-08-07 DIAGNOSIS — Z7901 Long term (current) use of anticoagulants: Secondary | ICD-10-CM | POA: Diagnosis not present

## 2015-08-07 DIAGNOSIS — F32A Depression, unspecified: Secondary | ICD-10-CM

## 2015-08-07 DIAGNOSIS — F329 Major depressive disorder, single episode, unspecified: Secondary | ICD-10-CM

## 2015-08-07 DIAGNOSIS — F419 Anxiety disorder, unspecified: Principal | ICD-10-CM

## 2015-08-07 MED ORDER — PREDNISONE 20 MG PO TABS: 40.0000 mg | ORAL_TABLET | Freq: Every day | ORAL | Status: DC

## 2015-08-07 MED ORDER — FLUTICASONE-SALMETEROL 250-50 MCG/DOSE IN AEPB: 1.0000 | INHALATION_SPRAY | Freq: Two times a day (BID) | RESPIRATORY_TRACT | Status: DC

## 2015-08-07 MED ORDER — TIOTROPIUM BROMIDE MONOHYDRATE 18 MCG IN CAPS: 18.0000 ug | ORAL_CAPSULE | Freq: Every day | RESPIRATORY_TRACT | Status: DC

## 2015-08-07 NOTE — Assessment & Plan Note (Addendum)
Treated for exacerbation today. Start steroid inhaler and spirva. Encouraged smoking cessation. Continue albuterol PRN. Plan for spirometry at next visit.  Alarm symptoms reviewed.

## 2015-08-07 NOTE — Assessment & Plan Note (Signed)
Encouraged smoking cessation to prevent progression of COPD.

## 2015-08-07 NOTE — Assessment & Plan Note (Signed)
Check lipid panel  

## 2015-08-07 NOTE — Assessment & Plan Note (Signed)
Controlled in the office today.  Continue current regimen. 

## 2015-08-07 NOTE — Progress Notes (Signed)
Subjective:    Patient ID: Michele Matthews, female    DOB: 1945/11/14, 70 y.o.   MRN: [id number]  HPI: Michele Matthews is a 70 y.o. female presenting on 08/07/2015 for Anxiety; COPD; and Anticoagulation   HPI  Pt presents recheck of depression. Had stopped lorazepam- but Dr. Hall Busing gave her a prescription. Son reports it's hard to get her out of the house. No thoughts of suicide. Feels her depression is worse. Feels it was worseing in the past 2 weeks. Has periods where she refuses to leave the house. Can't stand to ride in the car, cross a bridge. Has seen psychiatry in the past during hospitalization. Currently taking 2 lorazepam per day 0.5mg  daily. Hasn't felt like herself since Dr. Hall Busing cut her dosage down.   Still having scabs- picks at them daily. She can't stop herself from picking at the scab.   COPD: Using albuterol inhaler for shortness of breath- using twice per day. Cough productive of sputum in the AM- clear sputum has coughed up several years. Does not feel more short of breath. Some chest tightness.    Pt did not get PT/INR done Remains on warfarin. No current bleeding. No falls with head injury.  Past Medical History  Diagnosis Date  . Stroke ([place])   . Hypertension   . Seizures ([place])   . COPD (chronic obstructive pulmonary disease) ([place])   . Coronary artery disease   . Allergy   . Depression   . Emphysema of lung ([place])   . GERD (gastroesophageal reflux disease)   . Hyperlipidemia     Current Outpatient Prescriptions on File Prior to Visit  Medication Sig  . albuterol (PROVENTIL HFA;VENTOLIN HFA) 108 (90 Base) MCG/ACT inhaler Inhale 2 puffs into the lungs every 6 (six) hours as needed for wheezing or shortness of breath.  Marland Kitchen aspirin EC 81 MG tablet Take 81 mg by mouth daily.  Marland Kitchen atenolol (TENORMIN) 50 MG tablet Take 50 mg by mouth 2 (two) times daily.  Marland Kitchen atorvastatin (LIPITOR) 10 MG tablet Take 10 mg by mouth daily.  . ferrous sulfate 325 (65 FE) MG tablet Take 325 mg by  mouth daily with breakfast.  . gabapentin (NEURONTIN) 300 MG capsule Take 300 mg by mouth 3 (three) times daily.  Marland Kitchen levETIRAcetam (KEPPRA) 1000 MG tablet Take 1,000 mg by mouth 2 (two) times daily.  Marland Kitchen lisinopril (PRINIVIL,ZESTRIL) 20 MG tablet Take 20 mg by mouth daily as needed (blood pressure).  . LORazepam (ATIVAN) 1 MG tablet Take 1 tablet (1 mg total) by mouth every 8 (eight) hours.  . sertraline (ZOLOFT) 100 MG tablet Take 100 mg by mouth 2 (two) times daily.  . valACYclovir (VALTREX) 500 MG tablet Take 1,000 mg by mouth 3 (three) times daily.  Marland Kitchen warfarin (COUMADIN) 5 MG tablet Take 5 mg by mouth daily. 1/2 table on M,W,F,S 1 tablet on S,Th   No current facility-administered medications on file prior to visit.    Review of Systems  Constitutional: Negative for fever and chills.  HENT: Negative.   Respiratory: Positive for cough, shortness of breath (baseline.) and wheezing. Negative for chest tightness.   Cardiovascular: Negative for chest pain and leg swelling.  Gastrointestinal: Negative for nausea, vomiting, abdominal pain, diarrhea and constipation.  Endocrine: Negative.  Negative for cold intolerance, heat intolerance, polydipsia, polyphagia and polyuria.  Genitourinary: Negative for dysuria and difficulty urinating.  Musculoskeletal: Negative.   Neurological: Negative for dizziness, light-headedness and numbness.  Psychiatric/Behavioral: Positive for agitation. Negative  for suicidal ideas and sleep disturbance. The patient is nervous/anxious.    Per HPI unless specifically indicated above     Objective:    BP 126/78 mmHg  Pulse 87  Temp(Src) 97.7 F (36.5 C) (Oral)  Resp 16  Ht 5\' 6"  (1.676 m)  Wt 118 lb (53.524 kg)  BMI 19.05 kg/m2  Wt Readings from Last 3 Encounters:  08/07/15 118 lb (53.524 kg)  06/19/15 119 lb (53.978 kg)  12/10/14 146 lb (66.225 kg)    Depression screen [facility] 2/9 08/07/2015 08/07/2015 06/19/2015  Decreased Interest 2 2 1   Down, Depressed,  Hopeless 3 3 1   PHQ - 2 Score 5 5 2   Altered sleeping 0 0 0  Tired, decreased energy 3 3 0  Change in appetite 3 3 0  Feeling bad or failure about yourself  2 2 0  Trouble concentrating 0 0 1  Moving slowly or fidgety/restless 0 0 0  Suicidal thoughts 0 0 0  PHQ-9 Score 13 13 3   Difficult doing work/chores - Somewhat difficult -     Physical Exam  Constitutional: She is oriented to person, place, and time. She appears well-developed and well-nourished.  HENT:  Head: Normocephalic and atraumatic.  Neck: Neck supple.  Cardiovascular: Normal rate, regular rhythm and normal heart sounds.  Exam reveals no gallop and no friction rub.   No murmur heard. Pulmonary/Chest: Effort normal. No respiratory distress. She has no decreased breath sounds. She has wheezes. She has no rhonchi. She has no rales. Chest wall is not dull to percussion. She exhibits no tenderness.  Abdominal: Soft. Normal appearance and bowel sounds are normal. She exhibits no distension and no mass. There is no tenderness. There is no rebound and no guarding.  Musculoskeletal: Normal range of motion. She exhibits no edema or tenderness.  Lymphadenopathy:    She has no cervical adenopathy.  Neurological: She is alert and oriented to person, place, and time.  Skin: Skin is warm and dry. No rash noted. No erythema.  Healing skin tears on R shoulder.   Psychiatric: Her speech is normal and behavior is normal. Judgment and thought content normal. Her mood appears anxious. Cognition and memory are normal.  Tearful on exam. Nervously picking at skin.    Results for orders placed or performed during the hospital encounter of 12/10/14  MRSA PCR Screening  Result Value Ref Range   MRSA by PCR NEGATIVE NEGATIVE  Urine culture  Result Value Ref Range   Specimen Description URINE, CLEAN CATCH    Special Requests NONE    Culture INSIGNIFICANT GROWTH    Report Status 12/13/2014 FINAL   Glucose, capillary  Result Value Ref Range     Glucose-Capillary 101 (H) 65 - 99 mg/dL   Comment 1 Notify RN   Urinalysis complete, with microscopic  Result Value Ref Range   Color, Urine COLORLESS (A) YELLOW   APPearance CLEAR (A) CLEAR   Glucose, UA NEGATIVE NEGATIVE mg/dL   Bilirubin Urine NEGATIVE NEGATIVE   Ketones, ur NEGATIVE NEGATIVE mg/dL   Specific Gravity, Urine 1.004 (L) 1.005 - 1.030   Hgb urine dipstick 1+ (A) NEGATIVE   pH 6.0 5.0 - 8.0   Protein, ur NEGATIVE NEGATIVE mg/dL   Nitrite NEGATIVE NEGATIVE   Leukocytes, UA 2+ (A) NEGATIVE   RBC / HPF 0-5 0 - 5 RBC/hpf   WBC, UA TOO NUMEROUS TO COUNT 0 - 5 WBC/hpf   Bacteria, UA NONE SEEN NONE SEEN   Squamous Epithelial / LPF  0-5 (A) NONE SEEN  Comprehensive metabolic panel  Result Value Ref Range   Sodium 141 135 - 145 mmol/L   Potassium 4.5 3.5 - 5.1 mmol/L   Chloride 113 (H) 101 - 111 mmol/L   CO2 21 (L) 22 - 32 mmol/L   Glucose, Bld 95 65 - 99 mg/dL   BUN 23 (H) 6 - 20 mg/dL   Creatinine, Ser 0.84 0.44 - 1.00 mg/dL   Calcium 8.7 (L) 8.9 - 10.3 mg/dL   Total Protein 6.7 6.5 - 8.1 g/dL   Albumin 3.7 3.5 - 5.0 g/dL   AST 20 15 - 41 U/L   ALT 9 (L) 14 - 54 U/L   Alkaline Phosphatase 61 38 - 126 U/L   Total Bilirubin 0.2 (L) 0.3 - 1.2 mg/dL   GFR calc non Af Amer >60 >60 mL/min   GFR calc Af Amer >60 >60 mL/min   Anion gap 7 5 - 15  Lipase, blood  Result Value Ref Range   Lipase 31 22 - 51 U/L  Troponin I  Result Value Ref Range   Troponin I <0.03 <0.031 ng/mL  Lactic acid, plasma  Result Value Ref Range   Lactic Acid, Venous 1.2 0.5 - 2.0 mmol/L  CBC with Differential  Result Value Ref Range   WBC 6.5 3.6 - 11.0 K/uL   RBC 2.32 (L) 3.80 - 5.20 MIL/uL   Hemoglobin 4.0 (LL) 12.0 - 15.0 g/dL   HCT 15.6 (L) 35.0 - 47.0 %   MCV 67.2 (L) 80.0 - 100.0 fL   MCH 17.1 (L) 26.0 - 34.0 pg   MCHC 25.5 (L) 32.0 - 36.0 g/dL   RDW 21.6 (H) 11.5 - 14.5 %   Platelets 237 150 - 440 K/uL   Neutrophils Relative % 78% %   Neutro Abs 5.1 1.4 - 6.5 K/uL    Lymphocytes Relative 15% %   Lymphs Abs 1.0 1.0 - 3.6 K/uL   Monocytes Relative 5% %   Monocytes Absolute 0.4 0.2 - 0.9 K/uL   Eosinophils Relative 1% %   Eosinophils Absolute 0.1 0 - 0.7 K/uL   Basophils Relative 1% %   Basophils Absolute 0.0 0 - 0.1 K/uL  Protime-INR  Result Value Ref Range   Prothrombin Time 25.9 (H) 11.4 - 15.0 seconds   INR 2.36   Glucose, capillary  Result Value Ref Range   Glucose-Capillary 90 65 - 99 mg/dL  Basic metabolic panel  Result Value Ref Range   Sodium 140 135 - 145 mmol/L   Potassium 3.7 3.5 - 5.1 mmol/L   Chloride 105 101 - 111 mmol/L   CO2 23 22 - 32 mmol/L   Glucose, Bld 109 (H) 65 - 99 mg/dL   BUN 15 6 - 20 mg/dL   Creatinine, Ser 0.70 0.44 - 1.00 mg/dL   Calcium 9.3 8.9 - 10.3 mg/dL   GFR calc non Af Amer >60 >60 mL/min   GFR calc Af Amer >60 >60 mL/min   Anion gap 12 5 - 15  CBC  Result Value Ref Range   WBC 11.0 3.6 - 11.0 K/uL   RBC 4.16 3.80 - 5.20 MIL/uL   Hemoglobin 9.2 (L) 12.0 - 16.0 g/dL   HCT 30.2 (L) 35.0 - 47.0 %   MCV 72.7 (L) 80.0 - 100.0 fL   MCH 22.2 (L) 26.0 - 34.0 pg   MCHC 30.5 (L) 32.0 - 36.0 g/dL   RDW 24.4 (H) 11.5 - 14.5 %   Platelets 225  150 - 440 K/uL  Glucose, capillary  Result Value Ref Range   Glucose-Capillary 133 (H) 65 - 99 mg/dL  CBC  Result Value Ref Range   WBC 8.6 3.6 - 11.0 K/uL   RBC 4.26 3.80 - 5.20 MIL/uL   Hemoglobin 9.4 (L) 12.0 - 16.0 g/dL   HCT 30.8 (L) 35.0 - 47.0 %   MCV 72.3 (L) 80.0 - 100.0 fL   MCH 22.0 (L) 26.0 - 34.0 pg   MCHC 30.5 (L) 32.0 - 36.0 g/dL   RDW 24.9 (H) 11.5 - 14.5 %   Platelets 203 150 - 440 K/uL  Basic metabolic panel  Result Value Ref Range   Sodium 140 135 - 145 mmol/L   Potassium 3.5 3.5 - 5.1 mmol/L   Chloride 106 101 - 111 mmol/L   CO2 25 22 - 32 mmol/L   Glucose, Bld 108 (H) 65 - 99 mg/dL   BUN 10 6 - 20 mg/dL   Creatinine, Ser 0.55 0.44 - 1.00 mg/dL   Calcium 9.1 8.9 - 10.3 mg/dL   GFR calc non Af Amer >60 >60 mL/min   GFR calc Af Amer >60  >60 mL/min   Anion gap 9 5 - 15  CBC with Differential/Platelet  Result Value Ref Range   WBC 8.9 3.6 - 11.0 K/uL   RBC 4.86 3.80 - 5.20 MIL/uL   Hemoglobin 10.5 (L) 12.0 - 16.0 g/dL   HCT 35.6 35.0 - 47.0 %   MCV 73.2 (L) 80.0 - 100.0 fL   MCH 21.7 (L) 26.0 - 34.0 pg   MCHC 29.6 (L) 32.0 - 36.0 g/dL   RDW 26.5 (H) 11.5 - 14.5 %   Platelets 210 150 - 440 K/uL   Neutrophils Relative % 80% %   Neutro Abs 7.2 (H) 1.4 - 6.5 K/uL   Lymphocytes Relative 9% %   Lymphs Abs 0.8 (L) 1.0 - 3.6 K/uL   Monocytes Relative 9% %   Monocytes Absolute 0.8 0.2 - 0.9 K/uL   Eosinophils Relative 2% %   Eosinophils Absolute 0.1 0 - 0.7 K/uL   Basophils Relative 0% %   Basophils Absolute 0.0 0 - 0.1 K/uL  CBC  Result Value Ref Range   WBC 7.3 3.6 - 11.0 K/uL   RBC 4.38 3.80 - 5.20 MIL/uL   Hemoglobin 9.8 (L) 12.0 - 16.0 g/dL   HCT 32.8 (L) 35.0 - 47.0 %   MCV 75.0 (L) 80.0 - 100.0 fL   MCH 22.4 (L) 26.0 - 34.0 pg   MCHC 29.8 (L) 32.0 - 36.0 g/dL   RDW 26.4 (H) 11.5 - 14.5 %   Platelets 202 150 - 440 K/uL  Type and screen for Red Blood Exchange  Result Value Ref Range   ABO/RH(D) O NEG    Antibody Screen NEG    Sample Expiration 12/13/2014    Unit Number [id number]    Blood Component Type RED CELLS,LR    Unit division 00    Status of Unit ISSUED,FINAL    Transfusion Status OK TO TRANSFUSE    Crossmatch Result Compatible    Unit Number [id number]    Blood Component Type RED CELLS,LR    Unit division 00    Status of Unit ISSUED,FINAL    Transfusion Status OK TO TRANSFUSE    Crossmatch Result Compatible    Unit Number [id number]    Blood Component Type RCLI PHER 1    Unit division 00    Status of Unit ISSUED,FINAL  Transfusion Status OK TO TRANSFUSE    Crossmatch Result Compatible   Prepare RBC  Result Value Ref Range   Order Confirmation ORDER PROCESSED BY BLOOD BANK   ABO/Rh  Result Value Ref Range   ABO/RH(D) O NEG       Assessment & Plan:   Problem List Items  Addressed This Visit      Cardiovascular and Mediastinum   BP (high blood pressure)    Controlled in the office today. Continue current regimen.       Relevant Orders   Comprehensive metabolic panel     Respiratory   Chronic obstructive pulmonary disease ([place])    Treated for exacerbation today. Start steroid inhaler and spirva. Encouraged smoking cessation. Continue albuterol PRN. Plan for spirometry at next visit.       Relevant Medications   predniSONE (DELTASONE) 20 MG tablet   Fluticasone-Salmeterol (ADVAIR) 250-50 MCG/DOSE AEPB   tiotropium (SPIRIVA) 18 MCG inhalation capsule     Other   HLD (hyperlipidemia)    Check lipid panel.       Relevant Orders   Lipid Profile   Current tobacco use    Encouraged smoking cessation to prevent progression of COPD.       Long term current use of anticoagulant therapy    Encouraged pt to PT/INR done to manage her warfarin.       Relevant Orders   INR/PT   Anxiety and depression - Primary    Pt very anxious today. Refusing to leave the house. No current changes in medication regimen. Discussed with patient that given her current symptoms- psychiatry might be helpful in managing her anxiety.  Referral placed to [facility] today.       Relevant Orders   Ambulatory referral to Psychiatry      Meds ordered this encounter  Medications  . predniSONE (DELTASONE) 20 MG tablet    Sig: Take 2 tablets (40 mg total) by mouth daily with breakfast.    Dispense:  10 tablet    Refill:  0    Order Specific Question:  Supervising Provider    Answer:  Arlis Porta [phone]  . Fluticasone-Salmeterol (ADVAIR) 250-50 MCG/DOSE AEPB    Sig: Inhale 1 puff into the lungs 2 (two) times daily.    Dispense:  60 each    Refill:  11    Order Specific Question:  Supervising Provider    Answer:  Arlis Porta [phone]  . tiotropium (SPIRIVA) 18 MCG inhalation capsule    Sig: Place 1 capsule (18 mcg total) into inhaler and inhale daily.     Dispense:  30 capsule    Refill:  11    Order Specific Question:  Supervising Provider    Answer:  Arlis Porta [phone]      Follow up plan: Return in about 2 months (around 10/07/2015) for Spirometry.

## 2015-08-07 NOTE — Patient Instructions (Signed)
Breathing: Please start your new inhalers for breathing. Take Advair twice daily and Spirva once daily. Take these every day. Take prednisone 40mg  (2 tablets) once daily for 5 days to help with breathing.   Anxiety: Keep your current medications the same until you can be seen by psychiatry to help with your anxiety.   Please get your labwork done so we can monitor your warfarin levels.

## 2015-08-07 NOTE — Assessment & Plan Note (Signed)
Encouraged pt to PT/INR done to manage her warfarin.

## 2015-08-07 NOTE — Assessment & Plan Note (Signed)
Pt very anxious today. Refusing to leave the house. No current changes in medication regimen. Discussed with patient that given her current symptoms- psychiatry might be helpful in managing her anxiety.  Referral placed to [facility] today.

## 2015-08-09 ENCOUNTER — Inpatient Hospital Stay
Admission: EM | Admit: 2015-08-09 | Discharge: 2015-08-11 | DRG: 378 | Disposition: A | Discharge status: Home / Self Care | Payer: Medicare HMO | Attending: Internal Medicine | Admitting: Internal Medicine

## 2015-08-09 ENCOUNTER — Emergency Department: Payer: Medicare HMO

## 2015-08-09 DIAGNOSIS — I10 Essential (primary) hypertension: Secondary | ICD-10-CM | POA: Diagnosis present

## 2015-08-09 DIAGNOSIS — F329 Major depressive disorder, single episode, unspecified: Secondary | ICD-10-CM | POA: Diagnosis present

## 2015-08-09 DIAGNOSIS — Z7982 Long term (current) use of aspirin: Secondary | ICD-10-CM | POA: Diagnosis not present

## 2015-08-09 DIAGNOSIS — K921 Melena: Secondary | ICD-10-CM | POA: Diagnosis present

## 2015-08-09 DIAGNOSIS — Z803 Family history of malignant neoplasm of breast: Secondary | ICD-10-CM

## 2015-08-09 DIAGNOSIS — Z7901 Long term (current) use of anticoagulants: Secondary | ICD-10-CM | POA: Diagnosis not present

## 2015-08-09 DIAGNOSIS — J440 Chronic obstructive pulmonary disease with acute lower respiratory infection: Secondary | ICD-10-CM | POA: Diagnosis present

## 2015-08-09 DIAGNOSIS — K2900 Acute gastritis without bleeding: Secondary | ICD-10-CM | POA: Diagnosis present

## 2015-08-09 DIAGNOSIS — B9620 Unspecified Escherichia coli [E. coli] as the cause of diseases classified elsewhere: Secondary | ICD-10-CM | POA: Diagnosis present

## 2015-08-09 DIAGNOSIS — J209 Acute bronchitis, unspecified: Secondary | ICD-10-CM | POA: Diagnosis present

## 2015-08-09 DIAGNOSIS — N390 Urinary tract infection, site not specified: Secondary | ICD-10-CM | POA: Diagnosis present

## 2015-08-09 DIAGNOSIS — I2510 Atherosclerotic heart disease of native coronary artery without angina pectoris: Secondary | ICD-10-CM | POA: Diagnosis present

## 2015-08-09 DIAGNOSIS — K259 Gastric ulcer, unspecified as acute or chronic, without hemorrhage or perforation: Secondary | ICD-10-CM | POA: Diagnosis present

## 2015-08-09 DIAGNOSIS — Z820 Family history of epilepsy and other diseases of the nervous system: Secondary | ICD-10-CM | POA: Diagnosis not present

## 2015-08-09 DIAGNOSIS — I69334 Monoplegia of upper limb following cerebral infarction affecting left non-dominant side: Secondary | ICD-10-CM

## 2015-08-09 DIAGNOSIS — I69898 Other sequelae of other cerebrovascular disease: Secondary | ICD-10-CM | POA: Diagnosis not present

## 2015-08-09 DIAGNOSIS — F17210 Nicotine dependence, cigarettes, uncomplicated: Secondary | ICD-10-CM | POA: Diagnosis present

## 2015-08-09 DIAGNOSIS — Z801 Family history of malignant neoplasm of trachea, bronchus and lung: Secondary | ICD-10-CM | POA: Diagnosis not present

## 2015-08-09 DIAGNOSIS — D62 Acute posthemorrhagic anemia: Secondary | ICD-10-CM | POA: Diagnosis present

## 2015-08-09 DIAGNOSIS — D500 Iron deficiency anemia secondary to blood loss (chronic): Secondary | ICD-10-CM | POA: Diagnosis present

## 2015-08-09 DIAGNOSIS — Z79899 Other long term (current) drug therapy: Secondary | ICD-10-CM

## 2015-08-09 DIAGNOSIS — G40909 Epilepsy, unspecified, not intractable, without status epilepticus: Secondary | ICD-10-CM | POA: Diagnosis present

## 2015-08-09 DIAGNOSIS — D649 Anemia, unspecified: Secondary | ICD-10-CM

## 2015-08-09 DIAGNOSIS — K219 Gastro-esophageal reflux disease without esophagitis: Secondary | ICD-10-CM | POA: Diagnosis present

## 2015-08-09 DIAGNOSIS — I248 Other forms of acute ischemic heart disease: Secondary | ICD-10-CM | POA: Diagnosis present

## 2015-08-09 DIAGNOSIS — Z8719 Personal history of other diseases of the digestive system: Secondary | ICD-10-CM

## 2015-08-09 LAB — PREPARE RBC (CROSSMATCH)

## 2015-08-09 LAB — URINALYSIS COMPLETE WITH MICROSCOPIC (ARMC ONLY)
Bilirubin Urine: NEGATIVE
Glucose, UA: 50 mg/dL — AB
HGB URINE DIPSTICK: NEGATIVE
Ketones, ur: NEGATIVE mg/dL
LEUKOCYTES UA: NEGATIVE
NITRITE: POSITIVE — AB
PH: 5 (ref 5.0–8.0)
Protein, ur: NEGATIVE mg/dL
Specific Gravity, Urine: 1.012 (ref 1.005–1.030)

## 2015-08-09 LAB — CBC
HCT: 18.3 % — ABNORMAL LOW (ref 35.0–47.0)
HEMOGLOBIN: 5.4 g/dL — AB (ref 12.0–16.0)
MCH: 20 pg — ABNORMAL LOW (ref 26.0–34.0)
MCHC: 29.3 g/dL — ABNORMAL LOW (ref 32.0–36.0)
MCV: 68.4 fL — ABNORMAL LOW (ref 80.0–100.0)
Platelets: 256 10*3/uL (ref 150–440)
RBC: 2.68 MIL/uL — AB (ref 3.80–5.20)
RDW: 17.7 % — ABNORMAL HIGH (ref 11.5–14.5)
WBC: 6.1 10*3/uL (ref 3.6–11.0)

## 2015-08-09 LAB — BASIC METABOLIC PANEL
Anion gap: 4 — ABNORMAL LOW (ref 5–15)
BUN: 12 mg/dL (ref 6–20)
CALCIUM: 8.7 mg/dL — AB (ref 8.9–10.3)
CO2: 24 mmol/L (ref 22–32)
CREATININE: 0.53 mg/dL (ref 0.44–1.00)
Chloride: 109 mmol/L (ref 101–111)
GFR calc non Af Amer: >60 mL/min (ref >60)
GLUCOSE: 118 mg/dL — AB (ref 65–99)
Potassium: 4.5 mmol/L (ref 3.5–5.1)
Sodium: 137 mmol/L (ref 135–145)

## 2015-08-09 LAB — COMPREHENSIVE METABOLIC PANEL
ALT: 15 U/L (ref 14–54)
AST: 27 U/L (ref 15–41)
Albumin: 3.5 g/dL (ref 3.5–5.0)
Alkaline Phosphatase: 58 U/L (ref 38–126)
Anion gap: 11 (ref 5–15)
BUN: 18 mg/dL (ref 6–20)
CHLORIDE: 101 mmol/L (ref 101–111)
CO2: 20 mmol/L — AB (ref 22–32)
CREATININE: 0.6 mg/dL (ref 0.44–1.00)
Calcium: 8.8 mg/dL — ABNORMAL LOW (ref 8.9–10.3)
GFR calc non Af Amer: >60 mL/min (ref >60)
Glucose, Bld: 139 mg/dL — ABNORMAL HIGH (ref 65–99)
Potassium: 3.7 mmol/L (ref 3.5–5.1)
SODIUM: 132 mmol/L — AB (ref 135–145)
Total Bilirubin: <0.1 mg/dL — ABNORMAL LOW (ref 0.3–1.2)
Total Protein: 6.4 g/dL — ABNORMAL LOW (ref 6.5–8.1)

## 2015-08-09 LAB — TSH: TSH: 0.998 u[IU]/mL (ref 0.350–4.500)

## 2015-08-09 LAB — LIPASE, BLOOD: Lipase: 30 U/L (ref 11–51)

## 2015-08-09 LAB — HEMOGLOBIN AND HEMATOCRIT, BLOOD
HCT: 21.5 % — ABNORMAL LOW (ref 35.0–47.0)
HCT: 26.5 % — ABNORMAL LOW (ref 35.0–47.0)
HCT: 24.7 % — ABNORMAL LOW (ref 35.0–47.0)
HEMOGLOBIN: 7.7 g/dL — AB (ref 12.0–16.0)
Hemoglobin: 6.4 g/dL — ABNORMAL LOW (ref 12.0–16.0)
Hemoglobin: 8.2 g/dL — ABNORMAL LOW (ref 12.0–16.0)

## 2015-08-09 LAB — PROTIME-INR
INR: 1.98
INR: 1.79
PROTHROMBIN TIME: 20.8 s — AB (ref 11.4–15.0)
Prothrombin Time: 22.4 seconds — ABNORMAL HIGH (ref 11.4–15.0)

## 2015-08-09 LAB — APTT: APTT: 37 s — AB (ref 24–36)

## 2015-08-09 LAB — MAGNESIUM: Magnesium: 1.9 mg/dL (ref 1.7–2.4)

## 2015-08-09 LAB — TROPONIN I

## 2015-08-09 MED ORDER — ONDANSETRON HCL 4 MG/2ML IJ SOLN: 4.0000 mg | Freq: Four times a day (QID) | INTRAMUSCULAR | Status: DC | PRN

## 2015-08-09 MED ORDER — MOMETASONE FURO-FORMOTEROL FUM 200-5 MCG/ACT IN AERO
2.0000 | INHALATION_SPRAY | Freq: Two times a day (BID) | RESPIRATORY_TRACT | Status: DC
  Administered 2015-08-09 – 2015-08-11 (×4): 2 via RESPIRATORY_TRACT
  Filled 2015-08-09: qty 8.8

## 2015-08-09 MED ORDER — WARFARIN SODIUM 2.5 MG PO TABS: 2.5000 mg | ORAL_TABLET | Freq: Every day | ORAL | Status: DC

## 2015-08-09 MED ORDER — SERTRALINE HCL 100 MG PO TABS
100.0000 mg | ORAL_TABLET | Freq: Two times a day (BID) | ORAL | Status: DC
  Administered 2015-08-09 – 2015-08-11 (×4): 100 mg via ORAL
  Filled 2015-08-09 (×4): qty 1

## 2015-08-09 MED ORDER — LORAZEPAM 0.5 MG PO TABS
0.5000 mg | ORAL_TABLET | Freq: Three times a day (TID) | ORAL | Status: DC
  Administered 2015-08-09 – 2015-08-11 (×5): 0.5 mg via ORAL
  Filled 2015-08-09 (×5): qty 1

## 2015-08-09 MED ORDER — SODIUM CHLORIDE 0.9 % IV SOLN
80.0000 mg | INTRAVENOUS | Status: AC
  Administered 2015-08-09: 80 mg via INTRAVENOUS
  Filled 2015-08-09: qty 80

## 2015-08-09 MED ORDER — ACETAMINOPHEN 325 MG PO TABS
650.0000 mg | ORAL_TABLET | Freq: Four times a day (QID) | ORAL | Status: DC | PRN
  Administered 2015-08-10: 650 mg via ORAL
  Filled 2015-08-09: qty 2

## 2015-08-09 MED ORDER — SODIUM CHLORIDE 0.9% FLUSH: 3.0000 mL | Freq: Two times a day (BID) | INTRAVENOUS | Status: DC

## 2015-08-09 MED ORDER — ATORVASTATIN CALCIUM 10 MG PO TABS
10.0000 mg | ORAL_TABLET | Freq: Every day | ORAL | Status: DC
  Administered 2015-08-09 – 2015-08-11 (×2): 10 mg via ORAL
  Filled 2015-08-09 (×2): qty 1

## 2015-08-09 MED ORDER — SODIUM CHLORIDE 0.9 % IV SOLN: Freq: Once | INTRAVENOUS | Status: DC

## 2015-08-09 MED ORDER — LEVETIRACETAM 500 MG PO TABS
1000.0000 mg | ORAL_TABLET | Freq: Two times a day (BID) | ORAL | Status: DC
  Administered 2015-08-09 – 2015-08-11 (×5): 1000 mg via ORAL
  Filled 2015-08-09 (×5): qty 2

## 2015-08-09 MED ORDER — ASPIRIN EC 81 MG PO TBEC: 81.0000 mg | DELAYED_RELEASE_TABLET | Freq: Every day | ORAL | Status: DC

## 2015-08-09 MED ORDER — ACETAMINOPHEN 650 MG RE SUPP: 650.0000 mg | Freq: Four times a day (QID) | RECTAL | Status: DC | PRN

## 2015-08-09 MED ORDER — ATENOLOL 50 MG PO TABS
50.0000 mg | ORAL_TABLET | Freq: Two times a day (BID) | ORAL | Status: DC
  Administered 2015-08-09 – 2015-08-11 (×5): 50 mg via ORAL
  Filled 2015-08-09 (×5): qty 1

## 2015-08-09 MED ORDER — MORPHINE SULFATE (PF) 2 MG/ML IV SOLN: 1.0000 mg | INTRAVENOUS | Status: DC | PRN

## 2015-08-09 MED ORDER — PREDNISONE 20 MG PO TABS
40.0000 mg | ORAL_TABLET | Freq: Every day | ORAL | Status: DC
  Administered 2015-08-09 – 2015-08-11 (×2): 40 mg via ORAL
  Filled 2015-08-09 (×2): qty 2

## 2015-08-09 MED ORDER — ALBUTEROL SULFATE (2.5 MG/3ML) 0.083% IN NEBU: 3.0000 mL | INHALATION_SOLUTION | Freq: Four times a day (QID) | RESPIRATORY_TRACT | Status: DC | PRN

## 2015-08-09 MED ORDER — GABAPENTIN 300 MG PO CAPS
300.0000 mg | ORAL_CAPSULE | Freq: Two times a day (BID) | ORAL | Status: DC
  Administered 2015-08-09 – 2015-08-11 (×4): 300 mg via ORAL
  Filled 2015-08-09 (×4): qty 1

## 2015-08-09 MED ORDER — SODIUM CHLORIDE 0.9 % IV SOLN
Freq: Once | INTRAVENOUS | Status: AC
  Administered 2015-08-09: 22:00:00 via INTRAVENOUS

## 2015-08-09 MED ORDER — SODIUM CHLORIDE 0.9 % IV SOLN
8.0000 mg/h | INTRAVENOUS | Status: DC
  Administered 2015-08-09 – 2015-08-10 (×3): 8 mg/h via INTRAVENOUS
  Filled 2015-08-09 (×4): qty 80

## 2015-08-09 MED ORDER — TIOTROPIUM BROMIDE MONOHYDRATE 18 MCG IN CAPS
18.0000 ug | ORAL_CAPSULE | Freq: Every day | RESPIRATORY_TRACT | Status: DC
  Administered 2015-08-09 – 2015-08-11 (×2): 18 ug via RESPIRATORY_TRACT
  Filled 2015-08-09: qty 5

## 2015-08-09 MED ORDER — ONDANSETRON HCL 4 MG PO TABS: 4.0000 mg | ORAL_TABLET | Freq: Four times a day (QID) | ORAL | Status: DC | PRN

## 2015-08-09 MED ORDER — SODIUM CHLORIDE 0.9 % IV SOLN
10.0000 mL/h | Freq: Once | INTRAVENOUS | Status: AC
  Administered 2015-08-09: 10 mL/h via INTRAVENOUS

## 2015-08-09 MED ORDER — LISINOPRIL 20 MG PO TABS: 20.0000 mg | ORAL_TABLET | Freq: Every day | ORAL | Status: DC | PRN

## 2015-08-09 MED ORDER — SODIUM CHLORIDE 0.9 % IV SOLN
INTRAVENOUS | Status: DC
  Administered 2015-08-09 – 2015-08-10 (×5): via INTRAVENOUS

## 2015-08-09 NOTE — Consult Note (Signed)
Patient with hx of dark stools for 2 weeks, anemia, got 2 units of blood. Low MCV and MCH, likely iron def.  Has pain after eating.  Had EGD last year by Dr. Allen Norris showing ulcer and erosions.  Plan to do EGD  Tomorrow if her PT is below INR of 1.5. See Claudie Leach PA for complete note.

## 2015-08-09 NOTE — ED Provider Notes (Signed)
[facility] Emergency Department Provider Note  ____________________________________________  Time seen: Approximately 1:36 AM  I have reviewed the triage vital signs and the nursing notes.   HISTORY  Chief Complaint Chest Pain    HPI Michele Matthews is a 70 y.o. female with extensive chronic medical issues who presents with multiple medical complaints.  These include bilateral chest pain under her breasts, cough, but most of all generalized weakness and fatigue.  The generalized weakness and fatigue has been gradual in onset over the last week.  She states that she has no energy to do anything and her husband confirms she is mostly been lying down and sleeping.  She gets tired very easily with any exertion and nothing seems to make it better.  The acute onset chest discomfort bilaterally below her breasts occurred earlier tonight when she was coughing and she thinks it may be her the result of anxiety.  She describes it as mild to moderate but it has completely resolved.  She was seen recently by a primary care doctor who diagnosed her with bronchitis and she was given prednisone and breathing treatments.  She reports a history of feeling this tired in the past when she had some bleeding in her stool.  When asked specifically she endorses that she has had dark stools over at least a week and probably for longer.  She denies abdominal pain, nausea, vomiting, diarrhea. She does say that her stools of been black and tarry.  Past Medical History  Diagnosis Date  . Stroke ([place])   . Hypertension   . Seizures ([place])   . COPD (chronic obstructive pulmonary disease) ([place])   . Coronary artery disease   . Allergy   . Depression   . Emphysema of lung ([place])   . GERD (gastroesophageal reflux disease)   . Hyperlipidemia     Patient Active Problem List   Diagnosis Date Noted  . Melena 08/09/2015  . Anxiety 06/19/2015  . Chronic obstructive pulmonary disease ([place])  06/19/2015  . Arteriosclerosis of coronary artery 06/19/2015  . Acid reflux 06/19/2015  . HLD (hyperlipidemia) 06/19/2015  . BP (high blood pressure) 06/19/2015  . Long term current use of anticoagulant therapy 06/19/2015  . Seizure ([place]) 06/19/2015  . Anxiety and depression 06/19/2015  . Symptomatic anemia 12/10/2014  . Bleeding gastrointestinal   . Cerebrovascular accident (CVA) ([place]) 12/10/2013  . 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 Hemiparesis, left ([place]) 12/10/2013  . Current tobacco use 12/10/2013    Past Surgical History  Procedure Laterality Date  . Esophagogastroduodenoscopy (egd) with propofol N/A 12/11/2014    Procedure: ESOPHAGOGASTRODUODENOSCOPY (EGD) WITH PROPOFOL;  Surgeon: Lucilla Lame, MD;  Location: [facility] ENDOSCOPY;  Service: Endoscopy;  Laterality: N/A;    No current outpatient prescriptions on file.  Allergies Review of patient's allergies indicates no known allergies.  Family History  Problem Relation Age of Onset  . Alzheimer's disease Father   . Lung cancer Sister   . Breast cancer Sister   . Cancer Mother     Social History Social History  Substance Use Topics  . Smoking status: Current Every Day Smoker -- 1.00 packs/day  . Smokeless tobacco: Never Used  . Alcohol Use: No    Review of Systems Constitutional: No fever/chills.  Generalized weakness and fatigue Eyes: No visual changes. ENT: No sore throat. Cardiovascular: Denies chest pain. Respiratory: Denies shortness of breath. Gastrointestinal: No abdominal pain.  No nausea, no vomiting.  No diarrhea.  No constipation.  Black and tarry stools  for more than a week. Genitourinary: Negative for dysuria. Musculoskeletal: Negative for back pain. Skin: Negative for rash. Neurological: Negative for headaches, focal weakness or numbness.  10-point ROS otherwise negative.  ____________________________________________   PHYSICAL EXAM:  VITAL SIGNS: ED Triage Vitals  Enc Vitals Group     BP 08/09/15  0032 122/63 mmHg     Pulse Rate 08/09/15 0032 95     Resp 08/09/15 0032 16     Temp 08/09/15 0032 97.6 F (36.4 C)     Temp Source 08/09/15 0032 Oral     SpO2 08/09/15 0032 100 %     Weight 08/09/15 0032 118 lb (53.524 kg)     Height 08/09/15 0032 5\' 8"  (1.727 m)     Head Cir --      Peak Flow --      Pain Score 08/09/15 0033 5     Pain Loc --      Pain Edu? --      Excl. in [place]? --     Constitutional: Alert and oriented. Chronic ill appearance. Eyes: Conjunctivae are normal. PERRL. EOMI. Head: Atraumatic. Nose: No congestion/rhinnorhea. Mouth/Throat: Mucous membranes are moist.  Oropharynx non-erythematous. Neck: No stridor.  No meningeal signs.   Cardiovascular: Borderline tachycardia, regular rhythm. Good peripheral circulation. Grossly normal heart sounds.   Respiratory: Normal respiratory effort.  No retractions. Lungs CTAB. Gastrointestinal: Soft and nontender. No distention.  Rectal:  Non-tender.  Melena.  Strongly heme-positive Musculoskeletal: No lower extremity tenderness nor edema. No gross deformities of extremities. Neurologic:  Normal speech and language. No gross focal neurologic deficits are appreciated.  Skin:  Skin is warm, dry and intact. No rash noted.  Pale. Psychiatric: Mood and affect are normal. Speech and behavior are normal.  ____________________________________________   LABS (all labs ordered are listed, but only abnormal results are displayed)  Labs Reviewed  CBC - Abnormal; Notable for the following:    RBC 2.68 (*)    Hemoglobin 5.4 (*)    HCT 18.3 (*)    MCV 68.4 (*)    MCH 20.0 (*)    MCHC 29.3 (*)    RDW 17.7 (*)    All other components within normal limits  COMPREHENSIVE METABOLIC PANEL - Abnormal; Notable for the following:    Sodium 132 (*)    CO2 20 (*)    Glucose, Bld 139 (*)    Calcium 8.8 (*)    Total Protein 6.4 (*)    Total Bilirubin <0.1 (*)    All other components within normal limits  PROTIME-INR - Abnormal; Notable  for the following:    Prothrombin Time 22.4 (*)    All other components within normal limits  APTT - Abnormal; Notable for the following:    aPTT 37 (*)    All other components within normal limits  URINALYSIS COMPLETEWITH MICROSCOPIC ([facility] ONLY) - Abnormal; Notable for the following:    Color, Urine YELLOW (*)    APPearance CLEAR (*)    Glucose, UA 50 (*)    Nitrite POSITIVE (*)    Bacteria, UA MANY (*)    Squamous Epithelial / LPF 0-5 (*)    All other components within normal limits  URINE CULTURE  TROPONIN I  LIPASE, BLOOD  TSH  HEMOGLOBIN A1C  TYPE AND SCREEN  PREPARE RBC (CROSSMATCH)   ____________________________________________  EKG  ED ECG REPORT I, Toye Rouillard, the attending physician, personally viewed and interpreted this ECG.  Date: 08/09/2015 EKG Time: 00:34 Rate: 94 Rhythm:  normal sinus rhythm QRS Axis: normal Intervals: normal ST/T Wave abnormalities: normal Conduction Disturbances: none Narrative Interpretation: unremarkable  ____________________________________________  RADIOLOGY   Dg Chest 2 View  08/09/2015  CLINICAL DATA:  Right and left chest pain. Pain for 3 days, today with nausea and weakness. EXAM: CHEST  2 VIEW COMPARISON:  12/10/2014 FINDINGS: The heart is at the upper limits normal in size, there is atherosclerosis of the aorta. There is diffuse bronchial thickening. Pulmonary vasculature is normal. No consolidation, pleural effusion, or pneumothorax. No acute osseous abnormalities are seen. Remote compression deformity of T12 dating back to chest CT 05/17/2013. IMPRESSION: Bronchial thickening, suspect acute bronchitis. Electronically Signed   By: Jeb Levering M.D.   On: 08/09/2015 01:13    ____________________________________________   PROCEDURES  Procedure(s) performed: None  Critical Care performed: No ____________________________________________   INITIAL IMPRESSION / ASSESSMENT AND PLAN / ED COURSE  Pertinent labs &  imaging results that were available during my care of the patient were reviewed by me and considered in my medical decision making (see chart for details).  The patient appears chronically ill but is in no acute distress.  However, her hemoglobin is 5.4.  She is also significantly symptomatic from her severe anemia.  She has melena and a history of the same.  I verbally consented her for blood transfusion and have ordered the appropriate labs and studies.  I will admit her for further management of her GI bleeding and severe symptomatic anemia.  I have also ordered pantoprazole bolus and infusion.  She is hemodynamically stable.  Her troponin is negative and she also appears to have acute bronchitis, but there is no specific treatment necessary at this point and she is having no difficulty breathing.  ____________________________________________  FINAL CLINICAL IMPRESSION(S) / ED DIAGNOSES  Final diagnoses:  Symptomatic anemia  Melena  Acute bronchitis, unspecified organism      NEW MEDICATIONS STARTED DURING THIS VISIT:  Current Discharge Medication List        Note:  This document was prepared using Dragon voice recognition software and may include unintentional dictation errors.   Hinda Kehr, MD 08/09/15 [phone]

## 2015-08-09 NOTE — Progress Notes (Addendum)
Patient blood transfusion started at 21:35 not 19:30 as documented.

## 2015-08-09 NOTE — Progress Notes (Signed)
[facility] [place] NAME: Michele Matthews    MR#:  [id number]  DATE OF BIRTH:  [birth year]  SUBJECTIVE:  CHIEF COMPLAINT:  Patient is resting comfortably. Denies any chest pain or shortness of breath. Feeling better after 1 unit of blood. Getting second unit of blood during my examination  REVIEW OF SYSTEMS:  CONSTITUTIONAL: No fever, fatigue or weakness. Pale looking EYES: No blurred or double vision.  EARS, NOSE, AND THROAT: No tinnitus or ear pain.  RESPIRATORY: No cough, shortness of breath, wheezing or hemoptysis.  CARDIOVASCULAR: No chest pain, orthopnea, edema.  GASTROINTESTINAL: No nausea, vomiting, diarrhea or abdominal pain.  GENITOURINARY: No dysuria, hematuria.  ENDOCRINE: No polyuria, nocturia,  HEMATOLOGY: No anemia, easy bruising or bleeding SKIN: No rash or lesion. MUSCULOSKELETAL: No joint pain or arthritis.   NEUROLOGIC: No tingling, numbness, weakness.  PSYCHIATRY: No anxiety or depression.   DRUG ALLERGIES:  No Known Allergies  VITALS:  Blood pressure 129/56, pulse 75, temperature 98.3 F (36.8 C), temperature source Oral, resp. rate 18, height 5\' 8"  (1.727 m), weight 55.838 kg (123 lb 1.6 oz), SpO2 99 %.  PHYSICAL EXAMINATION:  GENERAL:  70 y.o.-year-old patient lying in the bed with no acute distress.  EYES: Pupils equal, round, reactive to light and accommodation. No scleral icterus. Extraocular muscles intact. Conjunctival pallor HEENT: Head atraumatic, normocephalic. Oropharynx and nasopharynx clear.  NECK:  Supple, no jugular venous distention. No thyroid enlargement, no tenderness.  LUNGS: Normal breath sounds bilaterally, no wheezing, rales,rhonchi or crepitation. No use of accessory muscles of respiration.  CARDIOVASCULAR: S1, S2 normal. Ejection systolic murmur, no rubs, or gallops.  ABDOMEN: Soft, nontender, nondistended. Bowel sounds present. No organomegaly or mass.  EXTREMITIES: No pedal edema,  cyanosis, or clubbing.  NEUROLOGIC: Cranial nerves II through XII are intact. Muscle strength 5/5 in all extremities. Sensation intact. Gait not checked.  PSYCHIATRIC: The patient is alert and oriented x 3.  SKIN: No obvious rash, lesion, or ulcer.    LABORATORY PANEL:   CBC  Recent Labs Lab 08/09/15 0035 08/09/15 0829  WBC 6.1  --   HGB 5.4* 6.4*  HCT 18.3* 21.5*  PLT 256  --    ------------------------------------------------------------------------------------------------------------------  Chemistries   Recent Labs Lab 08/09/15 0035  NA 132*  K 3.7  CL 101  CO2 20*  GLUCOSE 139*  BUN 18  CREATININE 0.60  CALCIUM 8.8*  AST 27  ALT 15  ALKPHOS 58  BILITOT <0.1*   ------------------------------------------------------------------------------------------------------------------  Cardiac Enzymes  Recent Labs Lab 08/09/15 0035  TROPONINI <0.03   ------------------------------------------------------------------------------------------------------------------  RADIOLOGY:  Dg Chest 2 View  08/09/2015  CLINICAL DATA:  Right and left chest pain. Pain for 3 days, today with nausea and weakness. EXAM: CHEST  2 VIEW COMPARISON:  12/10/2014 FINDINGS: The heart is at the upper limits normal in size, there is atherosclerosis of the aorta. There is diffuse bronchial thickening. Pulmonary vasculature is normal. No consolidation, pleural effusion, or pneumothorax. No acute osseous abnormalities are seen. Remote compression deformity of T12 dating back to chest CT 05/17/2013. IMPRESSION: Bronchial thickening, suspect acute bronchitis. Electronically Signed   By: Jeb Levering M.D.   On: 08/09/2015 01:13    EKG:   Orders placed or performed during the hospital encounter of 08/09/15  . EKG 12-Lead  . EKG 12-Lead  . ED EKG within 10 minutes  . ED EKG within 10 minutes    ASSESSMENT AND PLAN:  This is a 70 year old female  admitted for anemia secondary to GI bleed as  well as atypical chest pain. 1. Anemia: With history of GERD and gastric ulcer and melena stool for 2 weeks  No active bleeding at this time  We'll transfuse 2 -3units of packed red blood cells to keep her hemoglobin at around 8;  continue Protonix drip and GI is considering EGD in a.m. Will keep her nothing by mouth after midnight. Patient is hemodynamically stable at this time  Will monitor hemoglobin and hematocrit every 6 hours.   2. Atypical chest pain:  probably from demand ischemia from anemia. Resolved at this time Initial troponin is negative.   3. GI bleed: Presumably upper GI as the patient has had a gastric ulcer in the past.  continue nothing by mouth, IV fluids and PPI drip  Gastroenterology Dr. Vira Agar is considering EGD in a.m.   4. CVA: History of multiple events and slight residual motor deficit of left hand and hemianopia. Will hold warfarin due to GI bleed.  5. Essential hypertension: Blood pressure is stable at this time Continue lisinopril and atenolol  6. Seizure disorder: Continue Keppra  7. COPD: Continue inhaled corticosteroid as well as Spiriva  8. Depression: Continue sertraline  9. DVT prophylaxis: SCDs  10. GI prophylaxis: PPI      All the records are reviewed and case discussed with Care Management/Social Workerr. Management plans discussed with the patient, and her husband at bedside and they are in agreement.  CODE STATUS: FC  TOTAL TIME TAKING CARE OF THIS PATIENT: 35  minutes.   POSSIBLE D/C IN 2-3DAYS, DEPENDING ON CLINICAL CONDITION.   Nicholes Mango M.D on 08/09/2015 at 1:44 PM  Between 7am to 6pm - Pager - [phone] After 6pm go to [email or web address] - password [email or web address] [facility]  [facility]  Office  [phone]  CC: Primary care physician; Amy Annamary Rummage, NP

## 2015-08-09 NOTE — Consult Note (Signed)
GI Inpatient Consult Note  Reason for Consult: Melena, Anemia   Attending Requesting Consult: Dr. Marcille Blanco  History of Present Illness: Michele Matthews is a 70 y.o. female with a significant past medical history of strokes (3 CVA's, 3 TIA's with left sided weakness, on Coumadin, COPD.  Reports she has been feeling poorly over the last few weeks, has noticed dark stools as well.  She reports she was having a burning pain on both sides of her chest, thought it might be indigestion. Presented to the emergency department, was found to have a hemoglobin of 5.4 with heme positive stool.  She reports she had started using Aleve, 2 tablets every day at noon for the past month or so for headaches.  She used Tylenol in the past, but it was not offering relief.    She does not take a daily PPI. She believes she takes a vitamin with iron on a daily basis, but is unsure.  She reports she had been feeling anxious and nervous over the past month or so as well.  She has been hospitalized and treated for anemia and heme positive stool in the past.  In March 2015 she had a hemoglobin of 6.8 along with heme positive stool.  Consult note from that hospitalization from Dr. Gustavo Lah reports that she has been taking Nexium on a regular basis due to the finding of gastritis several years prior, however had been told to discontinue the Nexium and stopped 3 weeks prior to the hospitalization.  His note reports that she had an upper endoscopy and colonoscopy completed on March 28, 2012 for iron deficiency anemia on the upper endoscopy she was found to have hiatal hernia as well as some gastritis, H. pylori is negative.  Her colonoscopy showed internal hemorrhoids, diverticulosis, and a single polyp that was hyperplastic in nature.  Dr. Gustavo Lah performed an upper endoscopy on July 29, 2013 that showed LA grade C erosive esophagitis.  A medium sized hiatus hernia that was sliding.  Erosive gastritis.   duodenitis.  He  recommended pantoprazole 40 mg twice a day for 3 weeks and follow-up in our clinic in 3 weeks, there is no record of follow-up.  She was once again hospitalized for anemia in August 2016, with a hemoglobin of 4.  Upper endoscopy was completed at that time by Dr. Allen Norris, impression normal esophagus.  Gastric ulcer with clean base.  Gastritis.  Normal examined duodenum.  No specimens collected.  Recommendation use a PPI daily.  Past Medical History:  Past Medical History  Diagnosis Date  . Stroke ([place])   . Hypertension   . Seizures ([place])   . COPD (chronic obstructive pulmonary disease) ([place])   . Coronary artery disease   . Allergy   . Depression   . Emphysema of lung ([place])   . GERD (gastroesophageal reflux disease)   . Hyperlipidemia     Problem List: Patient Active Problem List   Diagnosis Date Noted  . Melena 08/09/2015  . Anxiety 06/19/2015  . Chronic obstructive pulmonary disease ([place]) 06/19/2015  . Arteriosclerosis of coronary artery 06/19/2015  . Acid reflux 06/19/2015  . HLD (hyperlipidemia) 06/19/2015  . BP (high blood pressure) 06/19/2015  . Long term current use of anticoagulant therapy 06/19/2015  . Seizure ([place]) 06/19/2015  . Anxiety and depression 06/19/2015  . Symptomatic anemia 12/10/2014  . Bleeding gastrointestinal   . Cerebrovascular accident (CVA) ([place]) 12/10/2013  . 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 Hemiparesis, left ([place]) 12/10/2013  . Current tobacco use  12/10/2013    Past Surgical History: Past Surgical History  Procedure Laterality Date  . Esophagogastroduodenoscopy (egd) with propofol N/A 12/11/2014    Procedure: ESOPHAGOGASTRODUODENOSCOPY (EGD) WITH PROPOFOL;  Surgeon: Lucilla Lame, MD;  Location: [facility] ENDOSCOPY;  Service: Endoscopy;  Laterality: N/A;    Allergies: No Known Allergies  Home Medications: Prescriptions prior to admission  Medication Sig Dispense Refill Last Dose  . albuterol (PROVENTIL HFA;VENTOLIN HFA) 108 (90 Base) MCG/ACT inhaler Inhale 2  puffs into the lungs every 6 (six) hours as needed for wheezing or shortness of breath. 1 Inhaler 11 08/08/2015 at Unknown time  . aspirin EC 81 MG tablet Take 81 mg by mouth daily.   08/08/2015 at Unknown time  . atenolol (TENORMIN) 50 MG tablet Take 50 mg by mouth 2 (two) times daily.   08/08/2015 at Unknown time  . atorvastatin (LIPITOR) 10 MG tablet Take 10 mg by mouth daily.   08/08/2015 at Unknown time  . Fluticasone-Salmeterol (ADVAIR) 250-50 MCG/DOSE AEPB Inhale 1 puff into the lungs 2 (two) times daily. 60 each 11 08/08/2015 at Unknown time  . gabapentin (NEURONTIN) 300 MG capsule Take 300 mg by mouth 2 (two) times daily.    08/08/2015 at Unknown time  . levETIRAcetam (KEPPRA) 1000 MG tablet Take 1,000 mg by mouth 2 (two) times daily.   08/08/2015 at Unknown time  . lisinopril (PRINIVIL,ZESTRIL) 20 MG tablet Take 20 mg by mouth daily as needed (blood pressure).   pr at nprn  . LORazepam (ATIVAN) 0.5 MG tablet Take 0.5 mg by mouth 3 (three) times daily.   08/08/2015 at Unknown time  . predniSONE (DELTASONE) 20 MG tablet Take 2 tablets (40 mg total) by mouth daily with breakfast. 10 tablet 0 08/08/2015 at Unknown time  . sertraline (ZOLOFT) 100 MG tablet Take 100 mg by mouth 2 (two) times daily.   08/08/2015 at Unknown time  . tiotropium (SPIRIVA) 18 MCG inhalation capsule Place 1 capsule (18 mcg total) into inhaler and inhale daily. 30 capsule 11 08/08/2015 at Unknown time  . warfarin (COUMADIN) 5 MG tablet Take 2.5-5 mg by mouth daily. 2.5mg  on M,W,F,S and 5mg  on S,Th   08/08/2015 at Unknown time   Home medication reconciliation was completed with the patient.   Scheduled Inpatient Medications:   . aspirin EC  81 mg Oral Daily  . atenolol  50 mg Oral BID  . atorvastatin  10 mg Oral Daily  . gabapentin  300 mg Oral BID  . levETIRAcetam  1,000 mg Oral BID  . LORazepam  0.5 mg Oral TID  . mometasone-formoterol  2 puff Inhalation BID  . predniSONE  40 mg Oral Q breakfast  . sertraline  100 mg Oral BID  .  sodium chloride flush  3 mL Intravenous Q12H  . tiotropium  18 mcg Inhalation Daily    Continuous Inpatient Infusions:   . sodium chloride 100 mL/hr at 08/09/15 0925  . pantoprozole (PROTONIX) infusion 8 mg/hr (08/09/15 0342)    PRN Inpatient Medications:  acetaminophen **OR** acetaminophen, albuterol, lisinopril, morphine injection, ondansetron **OR** ondansetron (ZOFRAN) IV  Family History: family history includes Alzheimer's disease in her father; Breast cancer in her sister; Cancer in her mother; Lung cancer in her sister.  T  Social History:   reports that she has been smoking.  She has never used smokeless tobacco. She reports that she does not drink alcohol or use illicit drugs.   Review of Systems: Constitutional: Weight is stable.  Eyes: No changes in vision.  ENT: No oral lesions, sore throat.  GI: see HPI.  Heme/Lymph: No easy bruising.  CV: Burning on both sides of chest  GU: No hematuria.  Integumentary: No rashes.  Neuro: No headaches.  Psych: recent nervousness / anxiety Endocrine: No heat/cold intolerance.  Allergic/Immunologic: No urticaria.  Resp: No cough, SOB.  Musculoskeletal: No joint swelling.    Physical Examination: BP 117/55 mmHg  Pulse 73  Temp(Src) 98.1 F (36.7 C) (Oral)  Resp 18  Ht 5\' 8"  (1.727 m)  Wt 55.838 kg (123 lb 1.6 oz)  BMI 18.72 kg/m2  SpO2 100% Gen: NAD, alert and oriented x 4, pale, unkept, husband is present and contributes to history. HEENT: PEERLA, EOMI, Neck: supple, no JVD or thyromegaly Chest: CTA bilaterally, no wheezes, crackles, or other adventitious sounds, hard to get her to take deep breaths CV: RRR, no m/g/c/r Abd: soft, NT, ND, +BS in all four quadrants; no HSM, guarding, ridigity, or rebound tenderness Ext: no edema, well perfused with 2+ pulses, Skin: no rash or lesions noted Lymph: no LAD  Data: Lab Results  Component Value Date   WBC 6.1 08/09/2015   HGB 6.4* 08/09/2015   HCT 21.5* 08/09/2015   MCV  68.4* 08/09/2015   PLT 256 08/09/2015    Recent Labs Lab 08/09/15 0035 08/09/15 0829  HGB 5.4* 6.4*   Lab Results  Component Value Date   NA 132* 08/09/2015   K 3.7 08/09/2015   CL 101 08/09/2015   CO2 20* 08/09/2015   BUN 18 08/09/2015   CREATININE 0.60 08/09/2015   Lab Results  Component Value Date   ALT 15 08/09/2015   AST 27 08/09/2015   ALKPHOS 58 08/09/2015   BILITOT <0.1* 08/09/2015    Recent Labs Lab 08/09/15 0035  APTT 37*  INR 1.98    Imaging: CLINICAL DATA: Right and left chest pain. Pain for 3 days, today with nausea and weakness.  EXAM: CHEST 2 VIEW  COMPARISON: 12/10/2014  FINDINGS: The heart is at the upper limits normal in size, there is atherosclerosis of the aorta. There is diffuse bronchial thickening. Pulmonary vasculature is normal. No consolidation, pleural effusion, or pneumothorax. No acute osseous abnormalities are seen. Remote compression deformity of T12 dating back to chest CT 05/17/2013.  IMPRESSION: Bronchial thickening, suspect acute bronchitis.   Electronically Signed  By: Jeb Levering M.D.  On: 08/09/2015 01:13  Assessment/Plan: Ms. Provance is a 70 y.o. female With anemia, melena, heme positive stool, NSAID use, history of  LA grade C erosive esophagitis.  A medium sized hiatus hernia that was sliding.  Erosive gastritis.   Duodenitis and gastric ulcer.    Hemoglobin on admission 5.4, currently 6.4 after transfusion of 2 units.  MCV 68.4.  BUN/creatinine 18/0.60.  Sodium 132.  On Protonix drip.  Last dose of Coumadin was last night at 5pm.  Recommendations: Dr. Vira Agar will evaluate further with EGD tomorrow. Avoid all NSAID's.  She should f/u in our office after d/c.  We will continue to follow with you.  Thank you for the consult. Please call with questions or concerns.  Salvadore Farber, PA-C  I personally performed these services.

## 2015-08-09 NOTE — Progress Notes (Addendum)
Central Tele called to notify that pt had 17 beat run of V- Tach.  Vitals taken  BP 137/66, HR 77, 96 % room air. Md Gouru paged. Nurse waiting on page.  MD returned page, Orders received for lab to draw BMP and Magnesium, MD requesting copy of tele strip, nurse contacted central tele, per central tele they can only send it to computer and we need to print it, they can not fax. Nurse does not have access to print.

## 2015-08-09 NOTE — ED Notes (Signed)
Pt reports having bilateral chest pain under breasts.  Pt states she went to primary care doctor yesterday and was dx with chronic bronchitis and given advair, spiriva, and prednisone.  Pt states chest pain has been going on x3 days, but today had pain radiating from R shoulder to wrist and felt nauseated and weak.  Pt appears in no acute distress upon arrival by EMS.  EMS reports stable vitals and unremarkable EKG.

## 2015-08-09 NOTE — H&P (Signed)
Michele Matthews is an 70 y.o. female.   Chief Complaint: Chest pain HPI: The patient with past medical history significant for stroke, seizure disorder and gastric ulcer presents to the emergency department complaining of right-sided chest pain. She states that it has been intermittent over the last 2 weeks during which time she has also had a cough which makes the chest pain worse. She states that her pain usually occurs sometime after dinner but is generally different than reflux. It is sharp in character and lasts from minutes to hours. She states that her arms have become numb from the elbows to the hands during the chest pain. She denies any associated slurring of speech or difficulty speaking. She admits to headache, however. The patient also states that she has had dark colored stools for the last 2 weeks. Denies abdominal pain or pain with defecation. In the emergency Department laboratory evaluation revealed a hemoglobin of 5. Blood transfusion was ordered in the emergency department staff called the hospitalist service for admission.  Past Medical History  Diagnosis Date  . Stroke ([place])   . Hypertension   . Seizures ([place])   . COPD (chronic obstructive pulmonary disease) ([place])   . Coronary artery disease   . Allergy   . Depression   . Emphysema of lung ([place])   . GERD (gastroesophageal reflux disease)   . Hyperlipidemia     Past Surgical History  Procedure Laterality Date  . Esophagogastroduodenoscopy (egd) with propofol N/A 12/11/2014    Procedure: ESOPHAGOGASTRODUODENOSCOPY (EGD) WITH PROPOFOL;  Surgeon: Lucilla Lame, MD;  Location: [facility] ENDOSCOPY;  Service: Endoscopy;  Laterality: N/A;    Family History  Problem Relation Age of Onset  . Alzheimer's disease Father   . Lung cancer Sister   . Breast cancer Sister   . Cancer Mother    Social History:  reports that she has been smoking.  She has never used smokeless tobacco. She reports that she does not drink alcohol or use illicit  drugs.  Allergies: No Known Allergies  Medications Prior to Admission  Medication Sig Dispense Refill  . albuterol (PROVENTIL HFA;VENTOLIN HFA) 108 (90 Base) MCG/ACT inhaler Inhale 2 puffs into the lungs every 6 (six) hours as needed for wheezing or shortness of breath. 1 Inhaler 11  . aspirin EC 81 MG tablet Take 81 mg by mouth daily.    Marland Kitchen atenolol (TENORMIN) 50 MG tablet Take 50 mg by mouth 2 (two) times daily.    Marland Kitchen atorvastatin (LIPITOR) 10 MG tablet Take 10 mg by mouth daily.    . Fluticasone-Salmeterol (ADVAIR) 250-50 MCG/DOSE AEPB Inhale 1 puff into the lungs 2 (two) times daily. 60 each 11  . gabapentin (NEURONTIN) 300 MG capsule Take 300 mg by mouth 2 (two) times daily.     Marland Kitchen levETIRAcetam (KEPPRA) 1000 MG tablet Take 1,000 mg by mouth 2 (two) times daily.    Marland Kitchen lisinopril (PRINIVIL,ZESTRIL) 20 MG tablet Take 20 mg by mouth daily as needed (blood pressure).    . LORazepam (ATIVAN) 0.5 MG tablet Take 0.5 mg by mouth 3 (three) times daily.    . predniSONE (DELTASONE) 20 MG tablet Take 2 tablets (40 mg total) by mouth daily with breakfast. 10 tablet 0  . sertraline (ZOLOFT) 100 MG tablet Take 100 mg by mouth 2 (two) times daily.    Marland Kitchen tiotropium (SPIRIVA) 18 MCG inhalation capsule Place 1 capsule (18 mcg total) into inhaler and inhale daily. 30 capsule 11  . warfarin (COUMADIN) 5 MG tablet  Take 2.5-5 mg by mouth daily. 2.50m on M,W,F,S and 569mon S,Th      Results for orders placed or performed during the hospital encounter of 08/09/15 (from the past 48 hour(s))  CBC     Status: Abnormal   Collection Time: 08/09/15 12:35 AM  Result Value Ref Range   WBC 6.1 3.6 - 11.0 K/uL   RBC 2.68 (L) 3.80 - 5.20 MIL/uL   Hemoglobin 5.4 (L) 12.0 - 16.0 g/dL   HCT 18.3 (L) 35.0 - 47.0 %   MCV 68.4 (L) 80.0 - 100.0 fL   MCH 20.0 (L) 26.0 - 34.0 pg   MCHC 29.3 (L) 32.0 - 36.0 g/dL   RDW 17.7 (H) 11.5 - 14.5 %   Platelets 256 150 - 440 K/uL  Troponin I     Status: None   Collection Time: 08/09/15  12:35 AM  Result Value Ref Range   Troponin I <0.03 <0.031 ng/mL    Comment:        NO INDICATION OF MYOCARDIAL INJURY.   Comprehensive metabolic panel     Status: Abnormal   Collection Time: 08/09/15 12:35 AM  Result Value Ref Range   Sodium 132 (L) 135 - 145 mmol/L   Potassium 3.7 3.5 - 5.1 mmol/L   Chloride 101 101 - 111 mmol/L   CO2 20 (L) 22 - 32 mmol/L   Glucose, Bld 139 (H) 65 - 99 mg/dL   BUN 18 6 - 20 mg/dL   Creatinine, Ser 0.60 0.44 - 1.00 mg/dL   Calcium 8.8 (L) 8.9 - 10.3 mg/dL   Total Protein 6.4 (L) 6.5 - 8.1 g/dL   Albumin 3.5 3.5 - 5.0 g/dL   AST 27 15 - 41 U/L   ALT 15 14 - 54 U/L   Alkaline Phosphatase 58 38 - 126 U/L   Total Bilirubin <0.1 (L) 0.3 - 1.2 mg/dL   GFR calc non Af Amer >60 >60 mL/min   GFR calc Af Amer >60 >60 mL/min    Comment: (NOTE) The eGFR has been calculated using the CKD EPI equation. This calculation has not been validated in all clinical situations. eGFR's persistently <60 mL/min signify possible Chronic Kidney Disease.    Anion gap 11 5 - 15  Protime-INR     Status: Abnormal   Collection Time: 08/09/15 12:35 AM  Result Value Ref Range   Prothrombin Time 22.4 (H) 11.4 - 15.0 seconds   INR 1.98   APTT     Status: Abnormal   Collection Time: 08/09/15 12:35 AM  Result Value Ref Range   aPTT 37 (H) 24 - 36 seconds    Comment:        IF BASELINE aPTT IS ELEVATED, SUGGEST PATIENT RISK ASSESSMENT BE USED TO DETERMINE APPROPRIATE ANTICOAGULANT THERAPY.   Lipase, blood     Status: None   Collection Time: 08/09/15 12:35 AM  Result Value Ref Range   Lipase 30 11 - 51 U/L  Type and screen [place]   Status: None (Preliminary result)   Collection Time: 08/09/15  1:51 AM  Result Value Ref Range   ABO/RH(D) O NEG    Antibody Screen NEG    Sample Expiration 08/12/2015    Unit Number [id number]  Blood Component Type RCLI PHER 2    Unit division 00    Status of Unit ALLOCATED    Transfusion Status OK  TO TRANSFUSE    Crossmatch Result Compatible  Unit Number [id number]    Blood Component Type RCLI PHER 1    Unit division 00    Status of Unit ISSUED    Transfusion Status OK TO TRANSFUSE    Crossmatch Result Compatible    Unit Number [id number]    Blood Component Type RED CELLS,LR    Unit division 00    Status of Unit ALLOCATED    Transfusion Status OK TO TRANSFUSE    Crossmatch Result Compatible   Prepare RBC     Status: None   Collection Time: 08/09/15  1:51 AM  Result Value Ref Range   Order Confirmation ORDER PROCESSED BY BLOOD BANK   Urinalysis complete, with microscopic ([facility] only)     Status: Abnormal   Collection Time: 08/09/15  1:51 AM  Result Value Ref Range   Color, Urine YELLOW (A) YELLOW   APPearance CLEAR (A) CLEAR   Glucose, UA 50 (A) NEGATIVE mg/dL   Bilirubin Urine NEGATIVE NEGATIVE   Ketones, ur NEGATIVE NEGATIVE mg/dL   Specific Gravity, Urine 1.012 1.005 - 1.030   Hgb urine dipstick NEGATIVE NEGATIVE   pH 5.0 5.0 - 8.0   Protein, ur NEGATIVE NEGATIVE mg/dL   Nitrite POSITIVE (A) NEGATIVE   Leukocytes, UA NEGATIVE NEGATIVE   RBC / HPF 0-5 0 - 5 RBC/hpf   WBC, UA 0-5 0 - 5 WBC/hpf   Bacteria, UA MANY (A) NONE SEEN   Squamous Epithelial / LPF 0-5 (A) NONE SEEN   Dg Chest 2 View  08/09/2015  CLINICAL DATA:  Right and left chest pain. Pain for 3 days, today with nausea and weakness. EXAM: CHEST  2 VIEW COMPARISON:  12/10/2014 FINDINGS: The heart is at the upper limits normal in size, there is atherosclerosis of the aorta. There is diffuse bronchial thickening. Pulmonary vasculature is normal. No consolidation, pleural effusion, or pneumothorax. No acute osseous abnormalities are seen. Remote compression deformity of T12 dating back to chest CT 05/17/2013. IMPRESSION: Bronchial thickening, suspect acute bronchitis. Electronically Signed   By: Jeb Levering M.D.   On: 08/09/2015 01:13    Review of Systems  Constitutional: Negative for fever and  chills.  HENT: Negative for sore throat and tinnitus.   Eyes: Negative for blurred vision and redness.  Respiratory: Positive for cough. Negative for shortness of breath.   Cardiovascular: Positive for chest pain. Negative for palpitations, orthopnea and PND.  Gastrointestinal: Positive for melena. Negative for nausea, vomiting, abdominal pain and diarrhea.  Genitourinary: Negative for dysuria, urgency and frequency.  Musculoskeletal: Negative for myalgias and joint pain.  Skin: Negative for rash.       No lesions  Neurological: Positive for headaches. Negative for speech change, focal weakness and weakness.  Endo/Heme/Allergies: Does not bruise/bleed easily.       No temperature intolerance  Psychiatric/Behavioral: Negative for depression and suicidal ideas.    Blood pressure 116/93, pulse 87, temperature 98.1 F (36.7 C), temperature source Oral, resp. rate 13, height _0  (1.727 m), weight 53.524 kg (118 lb), SpO2 99 %. Physical Exam  Vitals reviewed. Constitutional: She is oriented to person, place, and time. She appears well-developed and well-nourished. No distress.  HENT:  Head: Normocephalic and atraumatic.  Mouth/Throat: Oropharynx is clear and moist.  Eyes: Conjunctivae and EOM are normal. Pupils are equal, round, and reactive to light. No scleral icterus.  Neck: Normal range of motion. Neck supple. No JVD present. No tracheal deviation present. No thyromegaly present.  Cardiovascular: Normal rate, regular rhythm and normal heart sounds.  Exam reveals no gallop and no friction rub.   No murmur heard. Respiratory: Effort normal and breath sounds normal.  GI: Soft. Bowel sounds are normal. She exhibits no distension. There is no tenderness.  Genitourinary:  Deferred  Lymphadenopathy:    She has no cervical adenopathy.  Neurological: She is alert and oriented to person, place, and time. No cranial nerve deficit. She exhibits normal muscle tone.  Grip strength left hand  decreased (chronic)  Skin: Skin is warm and dry. No rash noted. No erythema. There is pallor.  Psychiatric: She has a normal mood and affect. Her behavior is normal. Judgment and thought content normal.     Assessment/Plan This is a 70 year old female admitted for anemia secondary to GI bleed as well as atypical chest pain. 1. Anemia: We'll transfuse 2 units of packed red blood cells; the patient is currently on a Protonix drip and gastroenterology consult has been ordered. She is hemodynamically stable. 2. Atypical chest pain: The patient's pain is sharp in character as well as right sided. She has had intermittent chest pain for nearly 2 weeks. Troponin is negative. Cardiology consultation at discretion of the primary team. 3. GI bleed: Presumably upper GI as the patient has had a gastric ulcer in the past. Await gastroenterology mentations. She is nothing by mouth except for medications. 4. CVA: History of multiple events and slight residual motor deficit of left hand and hemianopia. Will hold warfarin due to GI bleed. 5. Essential hypertension: Continue lisinopril and atenolol 6. Seizure disorder: Continue Keppra 7. COPD: Continue inhaled corticosteroid as well as Spiriva 8. Depression: Continue sertraline 9. DVT prophylaxis: SCDs 10. GI prophylaxis: As above The patient is a full code. Time spent on admission orders and patient care approximately 45 minutes  Harrie Foreman, MD 08/09/2015, 4:38 AM

## 2015-08-09 NOTE — Care Management Note (Signed)
Case Management Note  Patient Details  Name: Michele Matthews MRN: [id number] Date of Birth: May 13, 1945  Subjective/Objective:       Admitted 08/09/15 with a two week history of dark stools and chest pain with no elevated troponins. Hgb=5 and received 2 units PRBCs in the ED. Admitted with a GI bleed. Dr Vira Agar is planning an EGD Monday depending on INR. Case management will follow with a case management assessment and discharge planning.               Action/Plan:   Expected Discharge Date:                  Expected Discharge Plan:     In-House Referral:     Discharge planning Services     Post Acute Care Choice:    Choice offered to:     DME Arranged:    DME Agency:     HH Arranged:    [place] Agency:     Status of Service:     Medicare Important Message Given:    Date Medicare IM Given:    Medicare IM give by:    Date Additional Medicare IM Given:    Additional Medicare Important Message give by:     If discussed at [place] of Stay Meetings, dates discussed:    Additional Comments:  Lanae Federer A, RN 08/09/2015, 5:33 PM

## 2015-08-09 NOTE — ED Notes (Signed)
Pt also reports having some blurred vision to bilateral eyes since bronchitis started.  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  Pt reports that she had a different stroke which left her L side weaker than the R side.

## 2015-08-10 ENCOUNTER — Encounter
Admission: EM | Disposition: A | Discharge status: Home / Self Care | Payer: Self-pay | Source: Home / Self Care | Attending: Internal Medicine

## 2015-08-10 ENCOUNTER — Inpatient Hospital Stay: Payer: Medicare HMO | Admitting: *Deleted

## 2015-08-10 HISTORY — PX: ESOPHAGOGASTRODUODENOSCOPY: SHX5428

## 2015-08-10 LAB — BASIC METABOLIC PANEL
ANION GAP: 4 — AB (ref 5–15)
BUN: 12 mg/dL (ref 6–20)
CALCIUM: 8.5 mg/dL — AB (ref 8.9–10.3)
CO2: 23 mmol/L (ref 22–32)
CREATININE: 0.47 mg/dL (ref 0.44–1.00)
Chloride: 111 mmol/L (ref 101–111)
GLUCOSE: 83 mg/dL (ref 65–99)
Potassium: 4 mmol/L (ref 3.5–5.1)
Sodium: 138 mmol/L (ref 135–145)

## 2015-08-10 LAB — CBC
HEMATOCRIT: 29.2 % — AB (ref 35.0–47.0)
Hemoglobin: 9.4 g/dL — ABNORMAL LOW (ref 12.0–16.0)
MCH: 23.9 pg — ABNORMAL LOW (ref 26.0–34.0)
MCHC: 32.4 g/dL (ref 32.0–36.0)
MCV: 73.9 fL — AB (ref 80.0–100.0)
PLATELETS: 214 10*3/uL (ref 150–440)
RBC: 3.95 MIL/uL (ref 3.80–5.20)
RDW: 19.4 % — AB (ref 11.5–14.5)
WBC: 7.4 10*3/uL (ref 3.6–11.0)

## 2015-08-10 LAB — PROTIME-INR
INR: 1.56
Prothrombin Time: 18.7 seconds — ABNORMAL HIGH (ref 11.4–15.0)

## 2015-08-10 LAB — HEMOGLOBIN AND HEMATOCRIT, BLOOD
HEMATOCRIT: 31.6 % — AB (ref 35.0–47.0)
HEMOGLOBIN: 10.1 g/dL — AB (ref 12.0–16.0)

## 2015-08-10 LAB — HEMOGLOBIN A1C

## 2015-08-10 SURGERY — EGD (ESOPHAGOGASTRODUODENOSCOPY)
Anesthesia: General

## 2015-08-10 MED ORDER — LIDOCAINE HCL (CARDIAC) 20 MG/ML IV SOLN
INTRAVENOUS | Status: DC | PRN
  Administered 2015-08-10: 30 mg via INTRAVENOUS

## 2015-08-10 MED ORDER — PROPOFOL 10 MG/ML IV BOLUS
INTRAVENOUS | Status: DC | PRN
  Administered 2015-08-10: 50 mg via INTRAVENOUS
  Administered 2015-08-10: 30 mg via INTRAVENOUS

## 2015-08-10 MED ORDER — OXYCODONE-ACETAMINOPHEN 5-325 MG PO TABS: 1.0000 | ORAL_TABLET | Freq: Four times a day (QID) | ORAL | Status: DC | PRN

## 2015-08-10 MED ORDER — PANTOPRAZOLE SODIUM 40 MG PO TBEC
40.0000 mg | DELAYED_RELEASE_TABLET | Freq: Two times a day (BID) | ORAL | Status: DC
  Administered 2015-08-10 – 2015-08-11 (×2): 40 mg via ORAL
  Filled 2015-08-10 (×2): qty 1

## 2015-08-10 MED ORDER — MIDAZOLAM HCL 5 MG/5ML IJ SOLN
INTRAMUSCULAR | Status: DC | PRN
  Administered 2015-08-10: 1 mg via INTRAVENOUS

## 2015-08-10 MED ORDER — NICOTINE 14 MG/24HR TD PT24
14.0000 mg | MEDICATED_PATCH | Freq: Every day | TRANSDERMAL | Status: DC
  Administered 2015-08-10 – 2015-08-11 (×2): 14 mg via TRANSDERMAL
  Filled 2015-08-10 (×2): qty 1

## 2015-08-10 MED ORDER — PROPOFOL 500 MG/50ML IV EMUL
INTRAVENOUS | Status: DC | PRN
  Administered 2015-08-10: 150 ug/kg/min via INTRAVENOUS

## 2015-08-10 MED ORDER — FENTANYL CITRATE (PF) 100 MCG/2ML IJ SOLN
INTRAMUSCULAR | Status: DC | PRN
  Administered 2015-08-10: 50 ug via INTRAVENOUS

## 2015-08-10 NOTE — Op Note (Signed)
[facility] Gastroenterology Patient Name: Michele Matthews Procedure Date: 08/10/2015 2:07 PM MRN: [id number] Account #: [id number] Date of Birth: [birth year] Admit Type: Inpatient Age: 70 Room: [facility] ENDO ROOM 1 Gender: Female Note Status: Finalized Procedure:            Upper GI endoscopy Indications:          Acute post hemorrhagic anemia, Melena Providers:            Manya Silvas, MD Medicines:            Propofol per Anesthesia Complications:        No immediate complications. Procedure:            Pre-Anesthesia Assessment:                       - After reviewing the risks and benefits, the patient                        was deemed in satisfactory condition to undergo the                        procedure.                       After obtaining informed consent, the endoscope was                        passed under direct vision. Throughout the procedure,                        the patient's blood pressure, pulse, and oxygen                        saturations were monitored continuously. The Endoscope                        was introduced through the mouth, and advanced to the                        second part of duodenum. The upper GI endoscopy was                        accomplished without difficulty. The patient tolerated                        the procedure well. Findings:      The examined esophagus was normal. GEJ 35cm.      Five non-bleeding cratered gastric ulcers with no stigmata of bleeding       were found in the gastric antrum.      Diffuse and patchy moderate inflammation characterized by erythema and       granularity was found in the gastric antrum.      One non-bleeding superficial gastric ulcer with no stigmata of bleeding       was found in the gastric body. The lesion was 5 mm in largest dimension.      Diffuse and patchy moderate inflammation characterized by erythema,       friability and granularity was found in the gastric body.  The examined duodenum was normal. Impression:           - Normal esophagus.                       -  Non-bleeding gastric ulcers with no stigmata of                        bleeding.                       - Acute gastritis.                       - Non-bleeding gastric ulcer with no stigmata of                        bleeding.                       - Gastritis.                       - No specimens collected. Recommendation:       - The findings and recommendations were discussed with                        the referring physician. PPI, liquid diet, clear                        liquids folllowed by full liquids. Avoid all NSAID meds                        except 81mg  asprin. Manya Silvas, MD 08/10/2015 2:28:14 PM This report has been signed electronically. Number of Addenda: 0 Note Initiated On: 08/10/2015 2:07 PM      [facility] ([place])

## 2015-08-10 NOTE — Transfer of Care (Signed)
Immediate Anesthesia Transfer of Care Note  [name]  Procedure(s) Performed: Procedure(s): ESOPHAGOGASTRODUODENOSCOPY (EGD) (N/A)  Patient Location: PACU and Short Stay  Anesthesia Type:General  Level of Consciousness: sedated  Airway & Oxygen Therapy: Patient Spontanous Breathing and Patient connected to nasal cannula oxygen  Post-op Assessment: Report given to RN and Post -op Vital signs reviewed and stable  Post vital signs: Reviewed and stable  Last Vitals:  Filed Vitals:   08/10/15 1104 08/10/15 1322  BP: 171/73 132/62  Pulse: 73 69  Temp: 36.8 C 36.6 C  Resp: 18 18    Complications: No apparent anesthesia complications

## 2015-08-10 NOTE — Progress Notes (Signed)
[place] [facility] [place] NAME: Michele Matthews    MR#:  [id number]  DATE OF BIRTH:  1946-01-24  SUBJECTIVE:  CHIEF COMPLAINT:  Patient is resting comfortably. See the patient prior to EGD. Denies any melena Denies any chest pain or shortness of breath. Feeling better after 3 unit of blood.  REVIEW OF SYSTEMS:  CONSTITUTIONAL: No fever, fatigue or weakness. Pale looking EYES: No blurred or double vision.  EARS, NOSE, AND THROAT: No tinnitus or ear pain.  RESPIRATORY: No cough, shortness of breath, wheezing or hemoptysis.  CARDIOVASCULAR: No chest pain, orthopnea, edema.  GASTROINTESTINAL: No nausea, vomiting, diarrhea or abdominal pain.  GENITOURINARY: No dysuria, hematuria.  ENDOCRINE: No polyuria, nocturia,  HEMATOLOGY: No anemia, easy bruising or bleeding SKIN: No rash or lesion. MUSCULOSKELETAL: No joint pain or arthritis.   NEUROLOGIC: No tingling, numbness, weakness.  PSYCHIATRY: No anxiety or depression.   DRUG ALLERGIES:  No Known Allergies  VITALS:  Blood pressure 164/72, pulse 72, temperature 99.1 F (37.3 C), temperature source Oral, resp. rate 18, height 5\' 8"  (1.727 m), weight 57.425 kg (126 lb 9.6 oz), SpO2 96 %.  PHYSICAL EXAMINATION:  GENERAL:  70 y.o.-year-old patient lying in the bed with no acute distress.  EYES: Pupils equal, round, reactive to light and accommodation. No scleral icterus. Extraocular muscles intact. Conjunctival pallor HEENT: Head atraumatic, normocephalic. Oropharynx and nasopharynx clear.  NECK:  Supple, no jugular venous distention. No thyroid enlargement, no tenderness.  LUNGS: Normal breath sounds bilaterally, no wheezing, rales,rhonchi or crepitation. No use of accessory muscles of respiration.  CARDIOVASCULAR: S1, S2 normal. Ejection systolic murmur, no rubs, or gallops.  ABDOMEN: Soft, nontender, nondistended. Bowel sounds present. No organomegaly or mass.  EXTREMITIES: No pedal edema,  cyanosis, or clubbing.  NEUROLOGIC: Cranial nerves II through XII are intact. Muscle strength 5/5 in all extremities. Sensation intact. Gait not checked.  PSYCHIATRIC: The patient is alert and oriented x 3.  SKIN: No obvious rash, lesion, or ulcer.    LABORATORY PANEL:   CBC  Recent Labs Lab 08/10/15 0346  WBC 7.4  HGB 9.4*  HCT 29.2*  PLT 214   ------------------------------------------------------------------------------------------------------------------  Chemistries   Recent Labs Lab 08/09/15 0035 08/09/15 1826 08/10/15 0346  NA 132* 137 138  K 3.7 4.5 4.0  CL 101 109 111  CO2 20* 24 23  GLUCOSE 139* 118* 83  BUN 18 12 12   CREATININE 0.60 0.53 0.47  CALCIUM 8.8* 8.7* 8.5*  MG  --  1.9  --   AST 27  --   --   ALT 15  --   --   ALKPHOS 58  --   --   BILITOT <0.1*  --   --    ------------------------------------------------------------------------------------------------------------------  Cardiac Enzymes  Recent Labs Lab 08/09/15 0035  TROPONINI <0.03   ------------------------------------------------------------------------------------------------------------------  RADIOLOGY:  Dg Chest 2 View  08/09/2015  CLINICAL DATA:  Right and left chest pain. Pain for 3 days, today with nausea and weakness. EXAM: CHEST  2 VIEW COMPARISON:  12/10/2014 FINDINGS: The heart is at the upper limits normal in size, there is atherosclerosis of the aorta. There is diffuse bronchial thickening. Pulmonary vasculature is normal. No consolidation, pleural effusion, or pneumothorax. No acute osseous abnormalities are seen. Remote compression deformity of T12 dating back to chest CT 05/17/2013. IMPRESSION: Bronchial thickening, suspect acute bronchitis. Electronically Signed   By: Jeb Levering M.D.   On: 08/09/2015 01:13    EKG:  Orders placed or performed during the hospital encounter of 08/09/15  . EKG 12-Lead  . EKG 12-Lead  . ED EKG within 10 minutes  . ED EKG within  10 minutes    ASSESSMENT AND PLAN:  This is a 70 year old female admitted for anemia secondary to GI bleed as well as atypical chest pain. 1. Anemia: With history of GERD and gastric ulcer and melena stool for 2 weeks  No active bleeding at this time  We'll transfuse packed red blood cells when necessary. Monitor hemoglobin and hematocrit  EGD done today has revealed gastritis. GI is recommending PPI and clear liquid diet and advance as tolerated and avoid NSAIDs except aspirin  2. Atypical chest pain:  probably from demand ischemia from anemia. Resolved at this time Initial troponin is negative.   3. GI bleed: Presumably upper GI as the patient has had a gastric ulcer in the past.   IV fluids and PPI  Gastroenterology Dr. Vira Agar did EGD today has revealed gastritis. GI is recommending PPI and clear liquid diet and advance as tolerated and avoid NSAIDs except aspirin  4. CVA: History of multiple events and slight residual motor deficit of left hand and hemianopia. Will hold warfarin due to GI bleed.  5. Essential hypertension: Blood pressure is elevated as she has not received any medications today  Continue lisinopril and atenolol  6. Seizure disorder: Continue Keppra  7. COPD: Continue inhaled corticosteroid as well as Spiriva  8. Depression: Continue sertraline  9. DVT prophylaxis: SCDs  10. GI prophylaxis: PPI      All the records are reviewed and case discussed with Care Management/Social Workerr. Management plans discussed with the patient, and her husband at bedside and they are in agreement.  CODE STATUS: FC  TOTAL TIME TAKING CARE OF THIS PATIENT: 35  minutes.   POSSIBLE D/C IN 2-3DAYS, DEPENDING ON CLINICAL CONDITION.   Nicholes Mango M.D on 08/10/2015 at 3:50 PM  Between 7am to 6pm - Pager - [phone] After 6pm go to [email or web address] - password [email or web address] [facility]  [facility]  Office  [phone]  CC: Primary care physician; Amy Annamary Rummage, NP

## 2015-08-10 NOTE — Anesthesia Preprocedure Evaluation (Addendum)
Anesthesia Evaluation  Patient identified by MRN, date of birth, ID band Patient confused    Reviewed: Allergy & Precautions, NPO status , Patient's Chart, lab work & pertinent test results, Unable to perform ROS - Chart review only  Airway Mallampati: II  TM Distance: >3 FB Neck ROM: Limited    Dental  (+) Missing, Poor Dentition   Pulmonary COPD,  COPD inhaler, Current Smoker,    Pulmonary exam normal        Cardiovascular Exercise Tolerance: Poor hypertension, Pt. on medications and Pt. on home beta blockers + CAD   Rhythm:Irregular Rate:Normal     Neuro/Psych Seizures -,  CVA, Residual Symptoms    GI/Hepatic GERD  Medicated,  Endo/Other    Renal/GU      Musculoskeletal   Abdominal (+) + scaphoid   Peds  Hematology  (+) anemia , Presented with upper GI bleed and Hb of 5. Transfused, Hb 9.4. On coumadin--INR now 1.56.   Anesthesia Other Findings   Reproductive/Obstetrics                            Anesthesia Physical Anesthesia Plan  ASA: III and emergent  Anesthesia Plan: General   Post-op Pain Management:    Induction: Intravenous  Airway Management Planned: Nasal Cannula  Additional Equipment:   Intra-op Plan:   Post-operative Plan:   Informed Consent: I have reviewed the patients History and Physical, chart, labs and discussed the procedure including the risks, benefits and alternatives for the proposed anesthesia with the patient or authorized representative who has indicated his/her understanding and acceptance.     Plan Discussed with: CRNA  Anesthesia Plan Comments:         Anesthesia Quick Evaluation

## 2015-08-10 NOTE — Progress Notes (Addendum)
Initial Nutrition Assessment   INTERVENTION:   -Await diet advancement as medically able -Will send [employer] on meal trays TID (each supplement approximately 300kcals, 9g protein) once diet order able to be advanced    NUTRITION DIAGNOSIS:   Inadequate oral intake related to acute illness as evidenced by per patient/family report, NPO status.  GOAL:   Patient will meet greater than or equal to 90% of their needs  MONITOR:   PO intake, Supplement acceptance, Diet advancement, Labs, Weight trends, I & O's  REASON FOR ASSESSMENT:   Malnutrition Screening Tool    ASSESSMENT:   Pt is a 70 year old female admitted for anemia secondary to GI bleed as well as atypical chest pain, s/p EGD this afternoon. Pt out of room on visit.   Current Nutrition: Pt out of room on visit, per [facility] pt with decreased appetite PTA. Pt has been NPO until today.   Scheduled Medications:  . sodium chloride   Intravenous Once  . aspirin EC  81 mg Oral Daily  . atenolol  50 mg Oral BID  . atorvastatin  10 mg Oral Daily  . gabapentin  300 mg Oral BID  . levETIRAcetam  1,000 mg Oral BID  . LORazepam  0.5 mg Oral TID  . mometasone-formoterol  2 puff Inhalation BID  . nicotine  14 mg Transdermal Daily  . pantoprazole  40 mg Oral BID  . predniSONE  40 mg Oral Q breakfast  . sertraline  100 mg Oral BID  . sodium chloride flush  3 mL Intravenous Q12H  . tiotropium  18 mcg Inhalation Daily    Continuous Medications:  . sodium chloride 100 mL/hr at 08/10/15 1233    Labs: reviewed  Gastrointestinal Profile: Last BM:  08/08/2015   Nutrition-Focused Physical Exam Findings:  Unable to complete Nutrition-Focused physical exam at this time.    Weight Change: Per [facility] decrease in weight. Per [facility] weight encounters weight increased 2 months ago, however 20lbs weight loss from 14% weight loss in 9 months.   Diet Order:  Diet clear liquid Room service appropriate?: Yes; Fluid consistency::  Thin Diet full liquid Room service appropriate?: Yes; Fluid consistency:: Thin  Skin:  Reviewed, no issues   Height:   Ht Readings from Last 1 Encounters:  08/09/15 5\' 8"  (1.727 m)    Weight:   Wt Readings from Last 1 Encounters:  08/10/15 126 lb 9.6 oz (57.425 kg)   Wt Readings from Last 10 Encounters:  08/10/15 126 lb 9.6 oz (57.425 kg)  08/07/15 118 lb (53.524 kg)  06/19/15 119 lb (53.978 kg)  12/10/14 146 lb (66.225 kg)  12/10/14 146 lb (66.225 kg)     BMI:  Body mass index is 19.25 kg/(m^2).  Estimated Nutritional Needs:   Kcal:  1590-1900kcals  Protein:  50-64g protein  Fluid:  >1.8L  EDUCATION NEEDS:   No education needs identified at this time  Dwyane Luo, RD, LDN Pager [phone] Weekend/On-Call Pager [phone]

## 2015-08-10 NOTE — Anesthesia Postprocedure Evaluation (Signed)
Anesthesia Post Note  [name]  Procedure(s) Performed: Procedure(s) (LRB): ESOPHAGOGASTRODUODENOSCOPY (EGD) (N/A)  Patient location during evaluation: Endoscopy Anesthesia Type: General Level of consciousness: awake Pain management: pain level controlled Vital Signs Assessment: post-procedure vital signs reviewed and stable Respiratory status: spontaneous breathing Cardiovascular status: blood pressure returned to baseline Postop Assessment: no headache Anesthetic complications: no    Last Vitals:  Filed Vitals:   08/10/15 1431 08/10/15 1441  BP: 123/62 144/63  Pulse: 73 78  Temp: 36.4 C   Resp: 15 22    Last Pain:  Filed Vitals:   08/10/15 1446  PainSc: 5                  Nakai Pollio M

## 2015-08-11 ENCOUNTER — Encounter: Payer: Self-pay | Admitting: Unknown Physician Specialty

## 2015-08-11 LAB — CBC
HEMATOCRIT: 32.6 % — AB (ref 35.0–47.0)
Hemoglobin: 10.5 g/dL — ABNORMAL LOW (ref 12.0–16.0)
MCH: 23.4 pg — ABNORMAL LOW (ref 26.0–34.0)
MCHC: 32.2 g/dL (ref 32.0–36.0)
MCV: 72.5 fL — AB (ref 80.0–100.0)
Platelets: 228 10*3/uL (ref 150–440)
RBC: 4.49 MIL/uL (ref 3.80–5.20)
RDW: 20.1 % — AB (ref 11.5–14.5)
WBC: 10.7 10*3/uL (ref 3.6–11.0)

## 2015-08-11 LAB — MISC LABCORP TEST (SEND OUT): Labcorp test code: 1453

## 2015-08-11 LAB — TYPE AND SCREEN
ABO/RH(D): O NEG
Antibody Screen: NEGATIVE
UNIT DIVISION: 0
UNIT DIVISION: 0
Unit division: 0
Unit division: 0
Unit division: 0

## 2015-08-11 LAB — URINE CULTURE
Culture: >=100000 — AB
SPECIAL REQUESTS: NORMAL

## 2015-08-11 MED ORDER — ACETAMINOPHEN 325 MG PO TABS: 650.0000 mg | ORAL_TABLET | Freq: Four times a day (QID) | ORAL | Status: DC | PRN

## 2015-08-11 MED ORDER — NICOTINE 14 MG/24HR TD PT24: 14.0000 mg | MEDICATED_PATCH | Freq: Every day | TRANSDERMAL | Status: DC

## 2015-08-11 MED ORDER — CEPHALEXIN 500 MG PO CAPS: 500.0000 mg | ORAL_CAPSULE | Freq: Three times a day (TID) | ORAL | Status: DC

## 2015-08-11 MED ORDER — CEPHALEXIN 500 MG PO CAPS
500.0000 mg | ORAL_CAPSULE | Freq: Three times a day (TID) | ORAL | Status: DC
  Administered 2015-08-11: 500 mg via ORAL
  Filled 2015-08-11: qty 1

## 2015-08-11 MED ORDER — PANTOPRAZOLE SODIUM 40 MG PO TBEC: 40.0000 mg | DELAYED_RELEASE_TABLET | Freq: Two times a day (BID) | ORAL | Status: DC

## 2015-08-11 NOTE — Discharge Instructions (Signed)
Activity as tolerated Diet cardiac Discontinue NSAIDs and Coumadin Follow-up with primary care physician in a week Follow-up with gastroenterology PA in 5 days, and GI to consider repeating hemoglobin and hematocrit in 5 days

## 2015-08-11 NOTE — Care Management Important Message (Signed)
Important Message  Patient Details  Name: Michele Matthews MRN: [id number] Date of Birth: 1945-06-06   Medicare Important Message Given:  Yes    Juliann Pulse A Oney Tatlock 08/11/2015, 12:28 PM

## 2015-08-11 NOTE — Discharge Summary (Signed)
[facility] NAME: Michele Matthews    MR#:  [id number]  DATE OF BIRTH:  1946/02/26  DATE OF ADMISSION:  08/09/2015 ADMITTING PHYSICIAN: Harrie Foreman, MD  DATE OF DISCHARGE: 08/11/15 PRIMARY CARE PHYSICIAN: Amy Annamary Rummage, NP    ADMISSION DIAGNOSIS:  Melena [K92.1] Symptomatic anemia [D64.9] Acute bronchitis, unspecified organism [J20.9]  DISCHARGE DIAGNOSIS:  Symptomatic  anemia secondary to melena Gastric ulcer with no bleeding and acute gastritis  SECONDARY DIAGNOSIS:   Past Medical History  Diagnosis Date  . Stroke ([place])   . Hypertension   . Seizures ([place])   . COPD (chronic obstructive pulmonary disease) ([place])   . Coronary artery disease   . Allergy   . Depression   . Emphysema of lung ([place])   . GERD (gastroesophageal reflux disease)   . Hyperlipidemia     HOSPITAL COURSE:   This is a 70 year old female admitted for anemia secondary to GI bleed as well as atypical chest pain. 1. Anemia: With history of GERD and gastric ulcer and melena stool for 2 weeks  No active bleeding at this time hemoglobin is stable at 10.5, Status post blood transfusion  Monitor hemoglobin and hematocrit EGD done 08/10/2015  has revealed  nonbleeding gastric ulcers with gastritis. GI is recommending PPItwice a day and outpatient follow-up .tolerated a liquid diet and advanced diet as tolerated   Patient is to  avoid NSAIDs except aspirin  2. Atypical chest pain: probably from demand ischemia from anemia. Resolved at this time Initial troponin is negative.   3. GI bleed:upper GI as the patient has had a gastric ulcer in the past.  IV fluids and PPI  Gastroenterology Dr. Vira Agar EGD done 08/10/2015  has revealed  nonbleeding gastric ulcers with gastritis. GI is recommending PPI twice a day and outpatient follow-up .tolerated advanced diet Patient is to  avoid NSAIDs except aspirin  4. CVA: History of multiple events and slight  residual motor deficit of left hand and hemianopia. Will hold warfarin due to GI bleed. Continue baby aspirin. Patient is to follow-up with GI - Cari Richard in 5 days  and GI to resume warfarin at their discretion after checking hb/hct, discussed with dr.Elliott  5. Essential hypertension: Blood pressure is elevated as she has not received any medications today Continue lisinopril and atenolol  6. Seizure disorder: Continue Keppra  7. COPD: Continue inhaled corticosteroid as well as Spiriva  8. Depression: Continue sertraline  9. DVT prophylaxis: SCDs  10. GI prophylaxis: PPI  11. uti -urine culture with greater than 100,000 columns of Escherichia coli, pansensitive Will treat with Keflex  DISCHARGE CONDITIONS:  fair  CONSULTS OBTAINED:  Treatment Team:  Manya Silvas, MD   PROCEDURES- EGD-with nonbleeding gastric ulcers and acute gastritis  DRUG ALLERGIES:  No Known Allergies  DISCHARGE MEDICATIONS:   Current Discharge Medication List    START taking these medications   Details  acetaminophen (TYLENOL) 325 MG tablet Take 2 tablets (650 mg total) by mouth every 6 (six) hours as needed for mild pain (or Fever >/= 101).    cephALEXin (KEFLEX) 500 MG capsule Take 1 capsule (500 mg total) by mouth every 8 (eight) hours. Qty: 15 capsule, Refills: 0    nicotine (NICODERM CQ - DOSED IN MG/24 HOURS) 14 mg/24hr patch Place 1 patch (14 mg total) onto the skin daily. Qty: 28 patch, Refills: 0    pantoprazole (PROTONIX) 40 MG tablet Take 1 tablet (40 mg  total) by mouth 2 (two) times daily. Qty: 60 tablet, Refills: 0      CONTINUE these medications which have NOT CHANGED   Details  albuterol (PROVENTIL HFA;VENTOLIN HFA) 108 (90 Base) MCG/ACT inhaler Inhale 2 puffs into the lungs every 6 (six) hours as needed for wheezing or shortness of breath. Qty: 1 Inhaler, Refills: 11   Associated Diagnoses: Centrilobular emphysema (HCC)    aspirin EC 81 MG tablet Take 81 mg by mouth  daily.    atenolol (TENORMIN) 50 MG tablet Take 50 mg by mouth 2 (two) times daily.    atorvastatin (LIPITOR) 10 MG tablet Take 10 mg by mouth daily.    Fluticasone-Salmeterol (ADVAIR) 250-50 MCG/DOSE AEPB Inhale 1 puff into the lungs 2 (two) times daily. Qty: 60 each, Refills: 11   Associated Diagnoses: Centrilobular emphysema (HCC)    gabapentin (NEURONTIN) 300 MG capsule Take 300 mg by mouth 2 (two) times daily.     levETIRAcetam (KEPPRA) 1000 MG tablet Take 1,000 mg by mouth 2 (two) times daily.    lisinopril (PRINIVIL,ZESTRIL) 20 MG tablet Take 20 mg by mouth daily as needed (blood pressure).    LORazepam (ATIVAN) 0.5 MG tablet Take 0.5 mg by mouth 3 (three) times daily.    predniSONE (DELTASONE) 20 MG tablet Take 2 tablets (40 mg total) by mouth daily with breakfast. Qty: 10 tablet, Refills: 0   Associated Diagnoses: Centrilobular emphysema (HCC)    sertraline (ZOLOFT) 100 MG tablet Take 100 mg by mouth 2 (two) times daily.    tiotropium (SPIRIVA) 18 MCG inhalation capsule Place 1 capsule (18 mcg total) into inhaler and inhale daily. Qty: 30 capsule, Refills: 11   Associated Diagnoses: Centrilobular emphysema ([place])      STOP taking these medications     warfarin (COUMADIN) 5 MG tablet          DISCHARGE INSTRUCTIONS:   Stop taking Coumadin until seen and evaluated by gastroenterology in  5 days Follow-up with PCP in a week Follow-up with gastroenterology in 5 days  DIET:  Cardiac diet  DISCHARGE CONDITION:  Fair  ACTIVITY:  Activity as tolerated  OXYGEN:  Home Oxygen: No.   Oxygen Delivery: room air  DISCHARGE LOCATION:  home   If you experience worsening of your admission symptoms, develop shortness of breath, life threatening emergency, suicidal or homicidal thoughts you must seek medical attention immediately by calling 911 or calling your MD immediately  if symptoms less severe.  You Must read complete instructions/literature along with all the  possible adverse reactions/side effects for all the Medicines you take and that have been prescribed to you. Take any new Medicines after you have completely understood and accpet all the possible adverse reactions/side effects.   Please note  You were cared for by a hospitalist during your hospital stay. If you have any questions about your discharge medications or the care you received while you were in the hospital after you are discharged, you can call the unit and asked to speak with the hospitalist on call if the hospitalist that took care of you is not available. Once you are discharged, your primary care physician will handle any further medical issues. Please note that NO REFILLS for any discharge medications will be authorized once you are discharged, as it is imperative that you return to your primary care physician (or establish a relationship with a primary care physician if you do not have one) for your aftercare needs so that they can reassess your need  for medications and monitor your lab values.     Today  Chief Complaint  Patient presents with  . Chest Pain   Patient is feeling fine. Denies any chest pain or shortness of breath. Tolerating advanced diet without any abdominal pain. No other episodes of bleeding. Hemoglobin is stable. Wants to go home.  ROS:  CONSTITUTIONAL: Denies fevers, chills. Denies any fatigue, weakness.  EYES: Denies blurry vision, double vision, eye pain. EARS, NOSE, THROAT: Denies tinnitus, ear pain, hearing loss. RESPIRATORY: Denies cough, wheeze, shortness of breath.  CARDIOVASCULAR: Denies chest pain, palpitations, edema.  GASTROINTESTINAL: Denies nausea, vomiting, diarrhea, abdominal pain. Denies bright red blood per rectum. GENITOURINARY: Denies dysuria, hematuria. ENDOCRINE: Denies nocturia or thyroid problems. HEMATOLOGIC AND LYMPHATIC: Denies easy bruising or bleeding. SKIN: Denies rash or lesion. MUSCULOSKELETAL: Denies pain in neck, back,  shoulder, knees, hips or arthritic symptoms.  NEUROLOGIC: Denies paralysis, paresthesias.  PSYCHIATRIC: Denies anxiety or depressive symptoms.   VITAL SIGNS:  Blood pressure 105/58, pulse 77, temperature 98.4 F (36.9 C), temperature source Oral, resp. rate 20, height 5\' 8"  (1.727 m), weight 56.609 kg (124 lb 12.8 oz), SpO2 96 %.  I/O:    Intake/Output Summary (Last 24 hours) at 08/11/15 1424 Last data filed at 08/11/15 1101  Gross per 24 hour  Intake    923 ml  Output      0 ml  Net    923 ml    PHYSICAL EXAMINATION:  GENERAL:  70 y.o.-year-old patient lying in the bed with no acute distress.  EYES: Pupils equal, round, reactive to light and accommodation. No scleral icterus. Extraocular muscles intact.  HEENT: Head atraumatic, normocephalic. Oropharynx and nasopharynx clear.  NECK:  Supple, no jugular venous distention. No thyroid enlargement, no tenderness.  LUNGS: Normal breath sounds bilaterally, no wheezing, rales,rhonchi or crepitation. No use of accessory muscles of respiration.  CARDIOVASCULAR: S1, S2 normal. No murmurs, rubs, or gallops.  ABDOMEN: Soft, non-tender, non-distended. Bowel sounds present. No organomegaly or mass.  EXTREMITIES: No pedal edema, cyanosis, or clubbing.  NEUROLOGIC: Cranial nerves II through XII are intact. Muscle strength 5/5 in all extremities. Sensation intact. Gait not checked.  PSYCHIATRIC: The patient is alert and oriented x 3.  SKIN: No obvious rash, lesion, or ulcer.   DATA REVIEW:   CBC  Recent Labs Lab 08/11/15 0532  WBC 10.7  HGB 10.5*  HCT 32.6*  PLT 228    Chemistries   Recent Labs Lab 08/09/15 0035 08/09/15 1826 08/10/15 0346  NA 132* 137 138  K 3.7 4.5 4.0  CL 101 109 111  CO2 20* 24 23  GLUCOSE 139* 118* 83  BUN 18 12 12   CREATININE 0.60 0.53 0.47  CALCIUM 8.8* 8.7* 8.5*  MG  --  1.9  --   AST 27  --   --   ALT 15  --   --   ALKPHOS 58  --   --   BILITOT <0.1*  --   --     Cardiac Enzymes  Recent  Labs Lab 08/09/15 0035  TROPONINI <0.03    Microbiology Results  Results for orders placed or performed during the hospital encounter of 08/09/15  Urine culture     Status: Abnormal   Collection Time: 08/09/15  1:51 AM  Result Value Ref Range Status   Specimen Description URINE, CATHETERIZED  Final   Special Requests Normal  Final   Culture >=100,000 COLONIES/mL ESCHERICHIA COLI (A)  Final   Report Status 08/11/2015 FINAL  Final   Organism ID, Bacteria ESCHERICHIA COLI (A)  Final      Susceptibility   Escherichia coli - MIC*    AMPICILLIN <=2 SENSITIVE Sensitive     CEFAZOLIN <=4 SENSITIVE Sensitive     CEFTRIAXONE <=1 SENSITIVE Sensitive     CIPROFLOXACIN <=0.25 SENSITIVE Sensitive     GENTAMICIN <=1 SENSITIVE Sensitive     IMIPENEM <=0.25 SENSITIVE Sensitive     NITROFURANTOIN <=16 SENSITIVE Sensitive     TRIMETH/SULFA <=20 SENSITIVE Sensitive     AMPICILLIN/SULBACTAM <=2 SENSITIVE Sensitive     PIP/TAZO <=4 SENSITIVE Sensitive     Extended ESBL NEGATIVE Sensitive     * >=100,000 COLONIES/mL ESCHERICHIA COLI    RADIOLOGY:  Dg Chest 2 View  08/09/2015  CLINICAL DATA:  Right and left chest pain. Pain for 3 days, today with nausea and weakness. EXAM: CHEST  2 VIEW COMPARISON:  12/10/2014 FINDINGS: The heart is at the upper limits normal in size, there is atherosclerosis of the aorta. There is diffuse bronchial thickening. Pulmonary vasculature is normal. No consolidation, pleural effusion, or pneumothorax. No acute osseous abnormalities are seen. Remote compression deformity of T12 dating back to chest CT 05/17/2013. IMPRESSION: Bronchial thickening, suspect acute bronchitis. Electronically Signed   By: Jeb Levering M.D.   On: 08/09/2015 01:13    EKG:   Orders placed or performed during the hospital encounter of 08/09/15  . EKG 12-Lead  . EKG 12-Lead  . ED EKG within 10 minutes  . ED EKG within 10 minutes      Management plans discussed with the patient, family and  they are in agreement.  CODE STATUS:     Code Status Orders        Start     Ordered   08/09/15 0416  Full code   Continuous     08/09/15 0415    Code Status History    Date Active Date Inactive Code Status Order ID Comments User Context   12/12/2014  2:34 PM 12/17/2014 12:57 AM DNR [id number]  Max Sane, MD Inpatient   12/10/2014  3:38 PM 12/12/2014  2:34 PM Full Code [id number]  Vaughan Basta, MD Inpatient      TOTAL TIME TAKING CARE OF THIS PATIENT: 45 minutes.    @[facility] @  on 08/11/2015 at 2:24 PM  Between 7am to 6pm - Pager - [phone]  After 6pm go to [email or web address] - password [email or web address] [facility]  [facility] [facility]  Office  [phone]  CC: Primary care physician; Amy Annamary Rummage, NP

## 2015-08-11 NOTE — Consult Note (Signed)
Patient with UGI tract bleed, now hgb up to 10.5.  She has had previous CVA and was on coumadin.  Plan per hospitalist is to wait a week and try to start coumadin back for stroke prevention.  OK tos tart ASA 81mg  in mean time. Will see in my office next week.

## 2015-08-20 ENCOUNTER — Inpatient Hospital Stay: Payer: Medicare HMO | Admitting: Family Medicine

## 2015-08-20 ENCOUNTER — Other Ambulatory Visit: Payer: Self-pay | Admitting: Family Medicine

## 2015-10-09 ENCOUNTER — Encounter: Payer: Self-pay | Admitting: Family Medicine

## 2015-10-09 ENCOUNTER — Ambulatory Visit: Payer: Medicare HMO | Admitting: Family Medicine

## 2015-10-26 ENCOUNTER — Other Ambulatory Visit: Payer: Self-pay

## 2015-10-26 ENCOUNTER — Emergency Department: Payer: Medicare HMO

## 2015-10-26 ENCOUNTER — Observation Stay
Admission: EM | Admit: 2015-10-26 | Discharge: 2015-10-28 | Disposition: A | Discharge status: Home / Self Care | Payer: Medicare HMO | Attending: Internal Medicine | Admitting: Internal Medicine

## 2015-10-26 DIAGNOSIS — R531 Weakness: Secondary | ICD-10-CM | POA: Insufficient documentation

## 2015-10-26 DIAGNOSIS — D649 Anemia, unspecified: Secondary | ICD-10-CM | POA: Diagnosis present

## 2015-10-26 DIAGNOSIS — Z8673 Personal history of transient ischemic attack (TIA), and cerebral infarction without residual deficits: Secondary | ICD-10-CM | POA: Diagnosis not present

## 2015-10-26 DIAGNOSIS — Z801 Family history of malignant neoplasm of trachea, bronchus and lung: Secondary | ICD-10-CM | POA: Diagnosis not present

## 2015-10-26 DIAGNOSIS — R569 Unspecified convulsions: Secondary | ICD-10-CM | POA: Diagnosis not present

## 2015-10-26 DIAGNOSIS — Z7951 Long term (current) use of inhaled steroids: Secondary | ICD-10-CM | POA: Insufficient documentation

## 2015-10-26 DIAGNOSIS — I10 Essential (primary) hypertension: Secondary | ICD-10-CM | POA: Diagnosis not present

## 2015-10-26 DIAGNOSIS — Z7982 Long term (current) use of aspirin: Secondary | ICD-10-CM | POA: Diagnosis not present

## 2015-10-26 DIAGNOSIS — J449 Chronic obstructive pulmonary disease, unspecified: Secondary | ICD-10-CM | POA: Diagnosis not present

## 2015-10-26 DIAGNOSIS — Z79899 Other long term (current) drug therapy: Secondary | ICD-10-CM | POA: Diagnosis not present

## 2015-10-26 DIAGNOSIS — K219 Gastro-esophageal reflux disease without esophagitis: Secondary | ICD-10-CM | POA: Insufficient documentation

## 2015-10-26 DIAGNOSIS — I2510 Atherosclerotic heart disease of native coronary artery without angina pectoris: Secondary | ICD-10-CM | POA: Insufficient documentation

## 2015-10-26 DIAGNOSIS — I679 Cerebrovascular disease, unspecified: Secondary | ICD-10-CM | POA: Insufficient documentation

## 2015-10-26 DIAGNOSIS — H53489 Generalized contraction of visual field, unspecified eye: Secondary | ICD-10-CM | POA: Diagnosis not present

## 2015-10-26 DIAGNOSIS — Z8711 Personal history of peptic ulcer disease: Secondary | ICD-10-CM | POA: Insufficient documentation

## 2015-10-26 DIAGNOSIS — F329 Major depressive disorder, single episode, unspecified: Secondary | ICD-10-CM | POA: Diagnosis not present

## 2015-10-26 DIAGNOSIS — E785 Hyperlipidemia, unspecified: Secondary | ICD-10-CM | POA: Insufficient documentation

## 2015-10-26 DIAGNOSIS — Z9109 Other allergy status, other than to drugs and biological substances: Secondary | ICD-10-CM | POA: Insufficient documentation

## 2015-10-26 DIAGNOSIS — D500 Iron deficiency anemia secondary to blood loss (chronic): Principal | ICD-10-CM | POA: Insufficient documentation

## 2015-10-26 DIAGNOSIS — I700 Atherosclerosis of aorta: Secondary | ICD-10-CM | POA: Diagnosis not present

## 2015-10-26 DIAGNOSIS — Z7901 Long term (current) use of anticoagulants: Secondary | ICD-10-CM | POA: Diagnosis not present

## 2015-10-26 DIAGNOSIS — Z803 Family history of malignant neoplasm of breast: Secondary | ICD-10-CM | POA: Insufficient documentation

## 2015-10-26 DIAGNOSIS — F17200 Nicotine dependence, unspecified, uncomplicated: Secondary | ICD-10-CM | POA: Insufficient documentation

## 2015-10-26 DIAGNOSIS — R51 Headache: Secondary | ICD-10-CM | POA: Diagnosis not present

## 2015-10-26 DIAGNOSIS — R4182 Altered mental status, unspecified: Secondary | ICD-10-CM | POA: Diagnosis not present

## 2015-10-26 DIAGNOSIS — Z818 Family history of other mental and behavioral disorders: Secondary | ICD-10-CM | POA: Insufficient documentation

## 2015-10-26 LAB — URINALYSIS COMPLETE WITH MICROSCOPIC (ARMC ONLY)
BACTERIA UA: NONE SEEN
Bilirubin Urine: NEGATIVE
GLUCOSE, UA: NEGATIVE mg/dL
HGB URINE DIPSTICK: NEGATIVE
Ketones, ur: NEGATIVE mg/dL
LEUKOCYTES UA: NEGATIVE
NITRITE: NEGATIVE
PH: 5 (ref 5.0–8.0)
Protein, ur: 30 mg/dL — AB
RBC / HPF: NONE SEEN RBC/hpf (ref 0–5)
SQUAMOUS EPITHELIAL / LPF: NONE SEEN
Specific Gravity, Urine: 1.021 (ref 1.005–1.030)
WBC UA: NONE SEEN WBC/hpf (ref 0–5)

## 2015-10-26 LAB — COMPREHENSIVE METABOLIC PANEL
ALBUMIN: 3.7 g/dL (ref 3.5–5.0)
ALK PHOS: 51 U/L (ref 38–126)
ALT: 14 U/L (ref 14–54)
ANION GAP: 6 (ref 5–15)
AST: 29 U/L (ref 15–41)
BILIRUBIN TOTAL: 0.4 mg/dL (ref 0.3–1.2)
BUN: 15 mg/dL (ref 6–20)
CALCIUM: 8.9 mg/dL (ref 8.9–10.3)
CO2: 24 mmol/L (ref 22–32)
Chloride: 108 mmol/L (ref 101–111)
Creatinine, Ser: 0.59 mg/dL (ref 0.44–1.00)
GFR calc Af Amer: >60 mL/min (ref >60)
GFR calc non Af Amer: >60 mL/min (ref >60)
GLUCOSE: 93 mg/dL (ref 65–99)
Potassium: 3.9 mmol/L (ref 3.5–5.1)
SODIUM: 138 mmol/L (ref 135–145)
TOTAL PROTEIN: 6.9 g/dL (ref 6.5–8.1)

## 2015-10-26 LAB — PROTIME-INR
INR: 2.41
PROTHROMBIN TIME: 26 s — AB (ref 11.4–15.0)

## 2015-10-26 LAB — CBC
HCT: 24.2 % — ABNORMAL LOW (ref 35.0–47.0)
HEMOGLOBIN: 7.3 g/dL — AB (ref 12.0–16.0)
MCH: 21 pg — AB (ref 26.0–34.0)
MCHC: 30 g/dL — AB (ref 32.0–36.0)
MCV: 70 fL — AB (ref 80.0–100.0)
Platelets: 233 10*3/uL (ref 150–440)
RBC: 3.46 MIL/uL — ABNORMAL LOW (ref 3.80–5.20)
RDW: 19.9 % — ABNORMAL HIGH (ref 11.5–14.5)
WBC: 5.7 10*3/uL (ref 3.6–11.0)

## 2015-10-26 LAB — PREPARE RBC (CROSSMATCH)

## 2015-10-26 LAB — APTT: APTT: 42 s — AB (ref 24–36)

## 2015-10-26 LAB — TROPONIN I: Troponin I: <0.03 ng/mL (ref <0.031)

## 2015-10-26 MED ORDER — WARFARIN - PHARMACIST DOSING INPATIENT
Freq: Every day | Status: DC
Start: 2015-10-26 — End: 2015-10-28
  Administered 2015-10-26 – 2015-10-27 (×2)
  Filled 2015-10-26 (×3): qty 1

## 2015-10-26 MED ORDER — BISACODYL 5 MG PO TBEC
5.0000 mg | DELAYED_RELEASE_TABLET | Freq: Every day | ORAL | Status: DC | PRN
  Administered 2015-10-28: 5 mg via ORAL
  Filled 2015-10-26: qty 1

## 2015-10-26 MED ORDER — LEVETIRACETAM 500 MG PO TABS
1000.0000 mg | ORAL_TABLET | Freq: Two times a day (BID) | ORAL | Status: DC
  Administered 2015-10-26 – 2015-10-28 (×4): 1000 mg via ORAL
  Filled 2015-10-26 (×4): qty 2

## 2015-10-26 MED ORDER — ACETAMINOPHEN 325 MG PO TABS
650.0000 mg | ORAL_TABLET | Freq: Four times a day (QID) | ORAL | Status: DC | PRN
  Administered 2015-10-26: 650 mg via ORAL
  Filled 2015-10-26: qty 2

## 2015-10-26 MED ORDER — WARFARIN SODIUM 5 MG PO TABS: 5.0000 mg | ORAL_TABLET | ORAL | Status: DC

## 2015-10-26 MED ORDER — DIPHENHYDRAMINE HCL 25 MG PO CAPS
25.0000 mg | ORAL_CAPSULE | Freq: Once | ORAL | Status: AC
  Administered 2015-10-26: 18:00:00 25 mg via ORAL
  Filled 2015-10-26: qty 1

## 2015-10-26 MED ORDER — ACETAMINOPHEN 325 MG PO TABS
650.0000 mg | ORAL_TABLET | Freq: Once | ORAL | Status: AC
  Administered 2015-10-26: 650 mg via ORAL
  Filled 2015-10-26: qty 2

## 2015-10-26 MED ORDER — ONDANSETRON HCL 4 MG PO TABS: 4.0000 mg | ORAL_TABLET | Freq: Four times a day (QID) | ORAL | Status: DC | PRN

## 2015-10-26 MED ORDER — WARFARIN SODIUM 5 MG PO TABS
2.5000 mg | ORAL_TABLET | ORAL | Status: DC
  Administered 2015-10-26 – 2015-10-27 (×2): 2.5 mg via ORAL
  Filled 2015-10-26 (×3): qty 1

## 2015-10-26 MED ORDER — ONDANSETRON HCL 4 MG/2ML IJ SOLN: 4.0000 mg | Freq: Four times a day (QID) | INTRAMUSCULAR | Status: DC | PRN

## 2015-10-26 MED ORDER — ALBUTEROL SULFATE (2.5 MG/3ML) 0.083% IN NEBU: 2.5000 mg | INHALATION_SOLUTION | RESPIRATORY_TRACT | Status: DC | PRN

## 2015-10-26 MED ORDER — MOMETASONE FURO-FORMOTEROL FUM 200-5 MCG/ACT IN AERO
2.0000 | INHALATION_SPRAY | Freq: Two times a day (BID) | RESPIRATORY_TRACT | Status: DC
  Administered 2015-10-26 – 2015-10-28 (×4): 2 via RESPIRATORY_TRACT
  Filled 2015-10-26: qty 8.8

## 2015-10-26 MED ORDER — ATENOLOL 25 MG PO TABS
50.0000 mg | ORAL_TABLET | Freq: Two times a day (BID) | ORAL | Status: DC
  Administered 2015-10-26 – 2015-10-28 (×4): 50 mg via ORAL
  Filled 2015-10-26 (×4): qty 2

## 2015-10-26 MED ORDER — ASPIRIN EC 81 MG PO TBEC
81.0000 mg | DELAYED_RELEASE_TABLET | Freq: Every day | ORAL | Status: DC
  Administered 2015-10-27 – 2015-10-28 (×2): 81 mg via ORAL
  Filled 2015-10-26 (×3): qty 1

## 2015-10-26 MED ORDER — ATORVASTATIN CALCIUM 20 MG PO TABS
10.0000 mg | ORAL_TABLET | Freq: Every day | ORAL | Status: DC
  Administered 2015-10-26 – 2015-10-28 (×3): 10 mg via ORAL
  Filled 2015-10-26 (×3): qty 1

## 2015-10-26 MED ORDER — ALBUTEROL SULFATE HFA 108 (90 BASE) MCG/ACT IN AERS: 2.0000 | INHALATION_SPRAY | Freq: Four times a day (QID) | RESPIRATORY_TRACT | Status: DC | PRN

## 2015-10-26 MED ORDER — SERTRALINE HCL 100 MG PO TABS
100.0000 mg | ORAL_TABLET | Freq: Two times a day (BID) | ORAL | Status: DC
  Administered 2015-10-26 – 2015-10-28 (×4): 100 mg via ORAL
  Filled 2015-10-26 (×4): qty 1

## 2015-10-26 MED ORDER — NICOTINE 14 MG/24HR TD PT24
14.0000 mg | MEDICATED_PATCH | Freq: Every day | TRANSDERMAL | Status: DC
  Administered 2015-10-26 – 2015-10-28 (×3): 14 mg via TRANSDERMAL
  Filled 2015-10-26 (×3): qty 1

## 2015-10-26 MED ORDER — LORAZEPAM 0.5 MG PO TABS
0.5000 mg | ORAL_TABLET | Freq: Two times a day (BID) | ORAL | Status: DC | PRN
  Administered 2015-10-26 – 2015-10-27 (×2): 0.5 mg via ORAL
  Filled 2015-10-26 (×2): qty 1

## 2015-10-26 MED ORDER — SODIUM CHLORIDE 0.9 % IV BOLUS (SEPSIS)
500.0000 mL | Freq: Once | INTRAVENOUS | Status: AC
  Administered 2015-10-26: 500 mL via INTRAVENOUS

## 2015-10-26 MED ORDER — GABAPENTIN 300 MG PO CAPS
300.0000 mg | ORAL_CAPSULE | Freq: Two times a day (BID) | ORAL | Status: DC
  Administered 2015-10-26 – 2015-10-28 (×4): 300 mg via ORAL
  Filled 2015-10-26 (×4): qty 1

## 2015-10-26 MED ORDER — PANTOPRAZOLE SODIUM 40 MG PO TBEC
40.0000 mg | DELAYED_RELEASE_TABLET | Freq: Two times a day (BID) | ORAL | Status: DC
  Administered 2015-10-26 – 2015-10-28 (×4): 40 mg via ORAL
  Filled 2015-10-26 (×4): qty 1

## 2015-10-26 MED ORDER — TIOTROPIUM BROMIDE MONOHYDRATE 18 MCG IN CAPS
18.0000 ug | ORAL_CAPSULE | Freq: Every day | RESPIRATORY_TRACT | Status: DC
  Administered 2015-10-26 – 2015-10-28 (×3): 18 ug via RESPIRATORY_TRACT
  Filled 2015-10-26: qty 5

## 2015-10-26 MED ORDER — ACETAMINOPHEN 650 MG RE SUPP: 650.0000 mg | Freq: Four times a day (QID) | RECTAL | Status: DC | PRN

## 2015-10-26 MED ORDER — SODIUM CHLORIDE 0.9 % IV BOLUS (SEPSIS)
1000.0000 mL | Freq: Once | INTRAVENOUS | Status: AC
  Administered 2015-10-26: 1000 mL via INTRAVENOUS

## 2015-10-26 MED ORDER — POLYETHYLENE GLYCOL 3350 17 G PO PACK: 17.0000 g | PACK | Freq: Every day | ORAL | Status: DC | PRN

## 2015-10-26 MED ORDER — SODIUM CHLORIDE 0.9 % IV SOLN
Freq: Once | INTRAVENOUS | Status: AC
  Administered 2015-10-26: 20:00:00 via INTRAVENOUS

## 2015-10-26 NOTE — H&P (Signed)
[facility] NAME: Michele Matthews    MR#:  [id number]  DATE OF BIRTH:  [birth year]  DATE OF ADMISSION:  10/26/2015  PRIMARY CARE PHYSICIAN: Leata Mouse, NP   REQUESTING/REFERRING PHYSICIAN: Dr. Mariea Clonts  CHIEF COMPLAINT:   Chief Complaint  Patient presents with  . Weakness    HISTORY OF PRESENT ILLNESS:  Michele Matthews  is a 70 y.o. female with a known history of Gastric ulcers, CVA on Coumadin, hypertension who ambulates with a walker at baseline presents to the emergency room complaining of dizziness and severe weakness. Over the past 2 days patient has had dizziness on standing up and is unable to ambulate with her walker. Her weakness has slowly progressed over the past 2 weeks. She has noticed some dark stools but no black tarry stools. No blood in stool. She was taking iron supplements in the past which she stopped. Continues to be on Coumadin. Patient was admitted on 08/09/2015 with anemia and heme-positive stool. During that admission she was transfused 3 units of packed RBC and EGD showed nonbleeding gastric ulcers. Coumadin was held for a week and restarted as outpatient. She has not had any blood work done since April and now. Today her stool is negative for ocular blood. Hemoglobin has dropped from 10.5-7.3. She also complains of some tunnel vision on standing up with dizziness.  PAST MEDICAL HISTORY:   Past Medical History  Diagnosis Date  . Stroke ([place])   . Hypertension   . Seizures ([place])   . COPD (chronic obstructive pulmonary disease) ([place])   . Coronary artery disease   . Allergy   . Depression   . Emphysema of lung ([place])   . GERD (gastroesophageal reflux disease)   . Hyperlipidemia     PAST SURGICAL HISTORY:   Past Surgical History  Procedure Laterality Date  . Esophagogastroduodenoscopy (egd) with propofol N/A 12/11/2014    Procedure: ESOPHAGOGASTRODUODENOSCOPY (EGD) WITH PROPOFOL;  Surgeon: Lucilla Lame,  MD;  Location: [facility] ENDOSCOPY;  Service: Endoscopy;  Laterality: N/A;  . Esophagogastroduodenoscopy N/A 08/10/2015    Procedure: ESOPHAGOGASTRODUODENOSCOPY (EGD);  Surgeon: Manya Silvas, MD;  Location: [facility] ENDOSCOPY;  Service: Endoscopy;  Laterality: N/A;    SOCIAL HISTORY:   Social History  Substance Use Topics  . Smoking status: Current Every Day Smoker -- 1.00 packs/day  . Smokeless tobacco: Never Used  . Alcohol Use: No    FAMILY HISTORY:   Family History  Problem Relation Age of Onset  . Alzheimer's disease Father   . Lung cancer Sister   . Breast cancer Sister   . Cancer Mother     DRUG ALLERGIES:  No Known Allergies  REVIEW OF SYSTEMS:   Review of Systems  Constitutional: Positive for malaise/fatigue. Negative for fever, chills and weight loss.  HENT: Negative for hearing loss and nosebleeds.   Eyes: Positive for blurred vision. Negative for double vision and pain.  Respiratory: Negative for cough, hemoptysis, sputum production, shortness of breath and wheezing.   Cardiovascular: Negative for chest pain, palpitations, orthopnea and leg swelling.  Gastrointestinal: Negative for nausea, vomiting, abdominal pain, diarrhea and constipation.  Genitourinary: Negative for dysuria and hematuria.  Musculoskeletal: Negative for myalgias, back pain and falls.  Skin: Negative for rash.  Neurological: Positive for dizziness, weakness and headaches. Negative for tremors, sensory change, speech change, focal weakness and seizures.  Endo/Heme/Allergies: Does not bruise/bleed easily.  Psychiatric/Behavioral: Negative for depression and memory loss. The patient is not  nervous/anxious.     MEDICATIONS AT HOME:   Prior to Admission medications   Medication Sig Start Date End Date Taking? Authorizing Provider  acetaminophen (TYLENOL) 325 MG tablet Take 650 mg by mouth every 6 (six) hours as needed for mild pain, fever or headache.   Yes Historical Provider, MD  albuterol  (PROVENTIL HFA;VENTOLIN HFA) 108 (90 Base) MCG/ACT inhaler Inhale 2 puffs into the lungs every 6 (six) hours as needed for wheezing or shortness of breath. 06/19/15  Yes Amy Overton Mam, NP  aspirin EC 81 MG tablet Take 81 mg by mouth daily.   Yes Historical Provider, MD  atenolol (TENORMIN) 50 MG tablet Take 50 mg by mouth 2 (two) times daily.   Yes Historical Provider, MD  atorvastatin (LIPITOR) 10 MG tablet Take 10 mg by mouth daily.   Yes Historical Provider, MD  Fluticasone-Salmeterol (ADVAIR) 250-50 MCG/DOSE AEPB Inhale 1 puff into the lungs 2 (two) times daily. 08/07/15  Yes Amy Overton Mam, NP  gabapentin (NEURONTIN) 300 MG capsule Take 300 mg by mouth 2 (two) times daily.    Yes Historical Provider, MD  levETIRAcetam (KEPPRA) 1000 MG tablet Take 1,000 mg by mouth 2 (two) times daily.   Yes Historical Provider, MD  lisinopril (PRINIVIL,ZESTRIL) 20 MG tablet Take 20 mg by mouth daily as needed (for increased BP).    Yes Historical Provider, MD  LORazepam (ATIVAN) 0.5 MG tablet Take 0.5 mg by mouth 3 (three) times daily.   Yes Historical Provider, MD  nicotine (NICODERM CQ - DOSED IN MG/24 HOURS) 14 mg/24hr patch Place 1 patch (14 mg total) onto the skin daily. 08/11/15  Yes Nicholes Mango, MD  pantoprazole (PROTONIX) 40 MG tablet Take 1 tablet (40 mg total) by mouth 2 (two) times daily. 08/11/15  Yes Nicholes Mango, MD  sertraline (ZOLOFT) 100 MG tablet Take 100 mg by mouth 2 (two) times daily.   Yes Historical Provider, MD  tiotropium (SPIRIVA) 18 MCG inhalation capsule Place 1 capsule (18 mcg total) into inhaler and inhale daily. 08/07/15  Yes Amy Overton Mam, NP  cephALEXin (KEFLEX) 500 MG capsule Take 1 capsule (500 mg total) by mouth every 8 (eight) hours. Patient not taking: Reported on 10/26/2015 08/11/15   Nicholes Mango, MD  predniSONE (DELTASONE) 20 MG tablet Take 2 tablets (40 mg total) by mouth daily with breakfast. Patient not taking: Reported on 10/26/2015 08/07/15   Amy Overton Mam, NP      VITAL SIGNS:  Blood pressure 133/59, pulse 73, temperature 98.3 F (36.8 C), temperature source Oral, resp. rate 18, height 5\' 5"  (1.651 m), weight 54.885 kg (121 lb), SpO2 98 %.  PHYSICAL EXAMINATION:  Physical Exam  GENERAL:  70 y.o.-year-old patient lying in the bed with no acute distress.  EYES: Pupils equal, round, reactive to light and accommodation. No scleral icterus. Extraocular muscles intact.  HEENT: Head atraumatic, normocephalic. Oropharynx and nasopharynx clear. No oropharyngeal erythema, moist oral mucosa. NECK:  Supple, no jugular venous distention. No thyroid enlargement, no tenderness.  LUNGS: Normal breath sounds bilaterally, no wheezing, rales, rhonchi. No use of accessory muscles of respiration.  CARDIOVASCULAR: S1, S2 normal. No murmurs, rubs, or gallops.  ABDOMEN: Soft, nontender, nondistended. Bowel sounds present. No organomegaly or mass.  EXTREMITIES: No pedal edema, cyanosis, or clubbing. + 2 pedal & radial pulses b/l.   NEUROLOGIC: Cranial nerves II through XII are intact. Motor strength - Weaker on the left and right. PSYCHIATRIC: The patient is alert and oriented x 3. Good affect.  SKIN: No obvious rash, lesion, or ulcer.   LABORATORY PANEL:   CBC  Recent Labs Lab 10/26/15 1323  WBC 5.7  HGB 7.3*  HCT 24.2*  PLT 233   ------------------------------------------------------------------------------------------------------------------  Chemistries   Recent Labs Lab 10/26/15 1323  NA 138  K 3.9  CL 108  CO2 24  GLUCOSE 93  BUN 15  CREATININE 0.59  CALCIUM 8.9  AST 29  ALT 14  ALKPHOS 51  BILITOT 0.4   ------------------------------------------------------------------------------------------------------------------  Cardiac Enzymes  Recent Labs Lab 10/26/15 1323  TROPONINI <0.03   ------------------------------------------------------------------------------------------------------------------  RADIOLOGY:  Dg Chest 2  View  10/26/2015  CLINICAL DATA:  Weakness EXAM: CHEST  2 VIEW COMPARISON:  08/09/2015 FINDINGS: Mild peribronchial thickening. Heart and mediastinal contours are within normal limits. No focal opacities or effusions. No acute bony abnormality. Atherosclerotic calcifications in the arch descending thoracic aorta. IMPRESSION: Mild bronchitic changes. Aortic atherosclerosis. Electronically Signed   By: Rolm Baptise M.D.   On: 10/26/2015 14:22   Ct Head Wo Contrast  10/26/2015  CLINICAL DATA:  Headache and altered mental status EXAM: CT HEAD WITHOUT CONTRAST TECHNIQUE: Contiguous axial images were obtained from the base of the skull through the vertex without intravenous contrast. COMPARISON:  Head CT and brain MRI December 10, 2014 FINDINGS: Brain: There is mild diffuse atrophy. There is no intracranial mass, hemorrhage, extra-axial fluid collection, or midline shift. There is evidence of a prior infarct in the medial left occipital lobe, stable. There is evidence of prior infarct involving the posterior right frontal lobe, superior right temporal lobe, and anterior to mid right parietal lobe, stable. There is mild small vessel disease in the centra semiovale bilaterally. There is no new gray-white compartment lesion. No acute infarct evident. Vascular: There are foci of calcification in the cavernous carotid arteries bilaterally. There are also foci of calcification in the distal vertebral arteries bilaterally, more on the left than on the right. Skull: The bony calvarium appears intact. Sinuses/Orbits: Orbits appear symmetric bilaterally. Visualized paranasal sinuses are clear. Other: Mastoid air cells are clear. IMPRESSION: Atrophy with stable prior infarcts and mild periventricular small vessel disease. No acute infarct evident. No hemorrhage or mass effect. Foci of atherosclerotic vascular calcification. Electronically Signed   By: Lowella Grip III M.D.   On: 10/26/2015 14:10     IMPRESSION AND PLAN:    * Symptomatic anemia likely due to chronic blood loss anemia Today her stool is negative for occult blood. Dizziness, tunnel vision and pre-syncope. Transfuse 1 unit PRBC Repeat labs in AM EGD if any bleeding or drop in Hb. Continue PO protonix Add iron supplements at discharge  * Weakness Likely due to anemia. PT consult.  * CVA on coumadin Will continue coumadin and ASA  * HTN Continue home medications  * DVT prophylaxis On Coumadin   All the records are reviewed and case discussed with ED provider. Management plans discussed with the patient, family and they are in agreement.  CODE STATUS: FULL CODE  TOTAL TIME TAKING CARE OF THIS PATIENT: 40 minutes.   Hillary Bow R M.D on 10/26/2015 at 3:56 PM  Between 7am to 6pm - Pager - [phone]  After 6pm go to [email or web address] - password [email or web address] [facility]  [facility]  Office  [phone]  CC: Primary care physician; Amy L Krebs, NP  Note: This dictation was prepared with Dragon dictation along with smaller phrase technology. Any transcriptional errors that result from this process are unintentional.

## 2015-10-26 NOTE — ED Provider Notes (Signed)
[facility] Emergency Department Provider Note  ____________________________________________  Time seen: Approximately 1:09 PM  I have reviewed the triage vital signs and the nursing notes.   HISTORY  Chief Complaint Weakness    HPI Michele Matthews is a 70 y.o. female with a history of CVA and residual left-sided weakness, recent upper GI bleeding with confirmed gastric ulcer while on Coumadin, presenting with generalized weakness. The patient and her husband report that since Friday, she has had progressively worsening weakness and today she was unable to ambulate. She denies any other symptoms including nausea vomiting or diarrhea, abdominal pain, dysuria, fevers or chills, cough or cold symptoms.   Past Medical History  Diagnosis Date  . Stroke ([place])   . Hypertension   . Seizures ([place])   . COPD (chronic obstructive pulmonary disease) ([place])   . Coronary artery disease   . Allergy   . Depression   . Emphysema of lung ([place])   . GERD (gastroesophageal reflux disease)   . Hyperlipidemia     Patient Active Problem List   Diagnosis Date Noted  . Melena 08/09/2015  . Anxiety 06/19/2015  . Chronic obstructive pulmonary disease ([place]) 06/19/2015  . Arteriosclerosis of coronary artery 06/19/2015  . Acid reflux 06/19/2015  . HLD (hyperlipidemia) 06/19/2015  . BP (high blood pressure) 06/19/2015  . Long term current use of anticoagulant therapy 06/19/2015  . Seizure ([place]) 06/19/2015  . Anxiety and depression 06/19/2015  . Symptomatic anemia 12/10/2014  . Bleeding gastrointestinal   . Cerebrovascular accident (CVA) ([place]) 12/10/2013  . 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 Hemiparesis, left ([place]) 12/10/2013  . Current tobacco use 12/10/2013    Past Surgical History  Procedure Laterality Date  . Esophagogastroduodenoscopy (egd) with propofol N/A 12/11/2014    Procedure: ESOPHAGOGASTRODUODENOSCOPY (EGD) WITH PROPOFOL;  Surgeon: Lucilla Lame, MD;  Location: [facility]  ENDOSCOPY;  Service: Endoscopy;  Laterality: N/A;  . Esophagogastroduodenoscopy N/A 08/10/2015    Procedure: ESOPHAGOGASTRODUODENOSCOPY (EGD);  Surgeon: Manya Silvas, MD;  Location[facility] ENDOSCOPY;  Service: Endoscopy;  Laterality: N/A;    Current Outpatient Rx  Name  Route  Sig  Dispense  Refill  . acetaminophen (TYLENOL) 325 MG tablet   Oral   Take 2 tablets (650 mg total) by mouth every 6 (six) hours as needed for mild pain (or Fever >/= 101).         Marland Kitchen albuterol (PROVENTIL HFA;VENTOLIN HFA) 108 (90 Base) MCG/ACT inhaler   Inhalation   Inhale 2 puffs into the lungs every 6 (six) hours as needed for wheezing or shortness of breath.   1 Inhaler   11   . aspirin EC 81 MG tablet   Oral   Take 81 mg by mouth daily.         Marland Kitchen atenolol (TENORMIN) 50 MG tablet   Oral   Take 50 mg by mouth 2 (two) times daily.         Marland Kitchen atorvastatin (LIPITOR) 10 MG tablet   Oral   Take 10 mg by mouth daily.         . cephALEXin (KEFLEX) 500 MG capsule   Oral   Take 1 capsule (500 mg total) by mouth every 8 (eight) hours.   15 capsule   0   . Fluticasone-Salmeterol (ADVAIR) 250-50 MCG/DOSE AEPB   Inhalation   Inhale 1 puff into the lungs 2 (two) times daily.   60 each   11   . gabapentin (NEURONTIN) 300 MG capsule   Oral  Take 300 mg by mouth 2 (two) times daily.          Marland Kitchen levETIRAcetam (KEPPRA) 1000 MG tablet   Oral   Take 1,000 mg by mouth 2 (two) times daily.         Marland Kitchen lisinopril (PRINIVIL,ZESTRIL) 20 MG tablet   Oral   Take 20 mg by mouth daily as needed (blood pressure).         . LORazepam (ATIVAN) 0.5 MG tablet   Oral   Take 0.5 mg by mouth 3 (three) times daily.         . nicotine (NICODERM CQ - DOSED IN MG/24 HOURS) 14 mg/24hr patch   Transdermal   Place 1 patch (14 mg total) onto the skin daily.   28 patch   0   . pantoprazole (PROTONIX) 40 MG tablet   Oral   Take 1 tablet (40 mg total) by mouth 2 (two) times daily.   60 tablet   0   .  predniSONE (DELTASONE) 20 MG tablet   Oral   Take 2 tablets (40 mg total) by mouth daily with breakfast.   10 tablet   0   . sertraline (ZOLOFT) 100 MG tablet   Oral   Take 100 mg by mouth 2 (two) times daily.         Marland Kitchen tiotropium (SPIRIVA) 18 MCG inhalation capsule   Inhalation   Place 1 capsule (18 mcg total) into inhaler and inhale daily.   30 capsule   11     Allergies Review of patient's allergies indicates no known allergies.  Family History  Problem Relation Age of Onset  . Alzheimer's disease Father   . Lung cancer Sister   . Breast cancer Sister   . Cancer Mother     Social History Social History  Substance Use Topics  . Smoking status: Current Every Day Smoker -- 1.00 packs/day  . Smokeless tobacco: Never Used  . Alcohol Use: No    Review of Systems Constitutional: No fever/chills.No lightheadedness or syncope. As of generalized weakness. Positive decreased ability to ambulate due to weakness. Eyes: No visual changes. No eye discharge. ENT: No sore throat. No congestion or rhinorrhea. Cardiovascular: Denies chest pain. Denies palpitations. Respiratory: Denies shortness of breath.  No cough. Gastrointestinal: No abdominal pain.  No nausea, no vomiting.  No diarrhea.  No constipation. Genitourinary: Negative for dysuria. Musculoskeletal: Negative for back pain. Skin: Negative for rash. Neurological: Negative for headaches. No focal numbness, tingling or weakness.   10-point ROS otherwise negative.  ____________________________________________   PHYSICAL EXAM:  VITAL SIGNS: ED Triage Vitals  Enc Vitals Group     BP 10/26/15 1258 133/59 mmHg     Pulse Rate 10/26/15 1258 73     Resp 10/26/15 1258 18     Temp 10/26/15 1258 98.3 F (36.8 C)     Temp Source 10/26/15 1258 Oral     SpO2 10/26/15 1258 98 %     Weight 10/26/15 1258 121 lb (54.885 kg)     Height 10/26/15 1258 5\' 5"  (1.651 m)     Head Cir --      Peak Flow --      Pain Score  10/26/15 1259 2     Pain Loc --      Pain Edu? --      Excl. in [place]? --     Constitutional: Alert and oriented. Chronically ill appearing but in no acute distress. Answers questions appropriately. Eyes: Conjunctivae  are normal.  EOMI. No scleral icterus. Head: Atraumatic. Nose: No congestion/rhinnorhea. Mouth/Throat: Mucous membranes are moist.  Neck: No stridor.  Supple.  No JVD. Cardiovascular: Normal rate, regular rhythm. No murmurs, rubs or gallops.  Respiratory: Normal respiratory effort.  No accessory muscle use or retractions. Lungs CTAB.  No wheezes, rales or ronchi. Gastrointestinal: Soft, nontender and nondistended.  No guarding or rebound.  No peritoneal signs. Musculoskeletal: No LE edema. No ttp in the calves or palpable cords.  Negative Homan's sign. Positive arthritic changes most prominent in the left index finger. Neurologic: Alert and oriented 3. Speech is clear.  Face and smile symmetric. Tongue is midline. EOMI. Left pupil is 4 mm and the right pupil is 3 mm; both pupils are symmetrically reactive.  No pronator drift. 5 out of 5 grip, biceps, triceps, right hip flexor, right plantar flexion and dorsiflexion.  4+/5 left hip flexor and left dorsiflexion. Normal sensation to light touch in the bilateral upper and lower extremities, and face. Normal heel-to-shin with the right heel on the left shin, but either difficulty with completing that has score some mild ataxia with left heel-to-shin. Skin:  Skin is warm, dry and intact. No rash noted. Psychiatric: Mood and affect are normal.   ____________________________________________   LABS (all labs ordered are listed, but only abnormal results are displayed)  Labs Reviewed  CBC - Abnormal; Notable for the following:    RBC 3.46 (*)    Hemoglobin 7.3 (*)    HCT 24.2 (*)    MCV 70.0 (*)    MCH 21.0 (*)    MCHC 30.0 (*)    RDW 19.9 (*)    All other components within normal limits  URINALYSIS COMPLETEWITH MICROSCOPIC  ([facility] ONLY) - Abnormal; Notable for the following:    Color, Urine YELLOW (*)    APPearance HAZY (*)    Protein, ur 30 (*)    All other components within normal limits  APTT - Abnormal; Notable for the following:    aPTT 42 (*)    All other components within normal limits  PROTIME-INR - Abnormal; Notable for the following:    Prothrombin Time 26.0 (*)    All other components within normal limits  COMPREHENSIVE METABOLIC PANEL  TROPONIN I  TYPE AND SCREEN   ____________________________________________  EKG   ED ECG REPORT I, Eula Listen, the attending physician, personally viewed and interpreted this ECG.   Date: 10/26/2015  EKG Time: 1322  Rate: 69  Rhythm: normal sinus rhythm  Axis: normal  Intervals:none  ST&T Change: No STEMI  ____________________________________________  RADIOLOGY  Dg Chest 2 View  10/26/2015  CLINICAL DATA:  Weakness EXAM: CHEST  2 VIEW COMPARISON:  08/09/2015 FINDINGS: Mild peribronchial thickening. Heart and mediastinal contours are within normal limits. No focal opacities or effusions. No acute bony abnormality. Atherosclerotic calcifications in the arch descending thoracic aorta. IMPRESSION: Mild bronchitic changes. Aortic atherosclerosis. Electronically Signed   By: Rolm Baptise M.D.   On: 10/26/2015 14:22   Ct Head Wo Contrast  10/26/2015  CLINICAL DATA:  Headache and altered mental status EXAM: CT HEAD WITHOUT CONTRAST TECHNIQUE: Contiguous axial images were obtained from the base of the skull through the vertex without intravenous contrast. COMPARISON:  Head CT and brain MRI December 10, 2014 FINDINGS: Brain: There is mild diffuse atrophy. There is no intracranial mass, hemorrhage, extra-axial fluid collection, or midline shift. There is evidence of a prior infarct in the medial left occipital lobe, stable. There is evidence of prior  infarct involving the posterior right frontal lobe, superior right temporal lobe, and anterior to mid right  parietal lobe, stable. There is mild small vessel disease in the centra semiovale bilaterally. There is no new gray-white compartment lesion. No acute infarct evident. Vascular: There are foci of calcification in the cavernous carotid arteries bilaterally. There are also foci of calcification in the distal vertebral arteries bilaterally, more on the left than on the right. Skull: The bony calvarium appears intact. Sinuses/Orbits: Orbits appear symmetric bilaterally. Visualized paranasal sinuses are clear. Other: Mastoid air cells are clear. IMPRESSION: Atrophy with stable prior infarcts and mild periventricular small vessel disease. No acute infarct evident. No hemorrhage or mass effect. Foci of atherosclerotic vascular calcification. Electronically Signed   By: Lowella Grip III M.D.   On: 10/26/2015 14:10    ____________________________________________   PROCEDURES  Procedure(s) performed: None  Critical Care performed: No ____________________________________________   INITIAL IMPRESSION / ASSESSMENT AND PLAN / ED COURSE  Pertinent labs & imaging results that were available during my care of the patient were reviewed by me and considered in my medical decision making (see chart for details).  70 y.o. female presenting with generalized weakness. The patient has no other signs or symptoms. She does have some abnormal neurologic findings on my examination but is residual symptoms from previous stroke. I'll get a CT scan to evaluate for any new neurologic abnormality, but we'll also evaluate for UTI, anemia, abnormal electrolytes, or arrhythmia.  ----------------------------------------- 2:08 PM on 10/26/2015 -----------------------------------------  The patient has a hemoglobin of 7.3 which is down from 10.5 in April of this year after her transfusion for her GI bleed. I am concerned that she may be having a repeat GI bleed but the patient is in radiology so I will reevaluate her with a  rectal examination and confirmation of whether she has restarted her Coumadin when she returns.  ----------------------------------------- 3:01 PM on 10/26/2015 -----------------------------------------  The patient does report "now that you mention it" that she has had some black stool. She denies any bloody stool. Her rectal exam does not show any bleeding or thrombosed external hemorrhoids, no palpable internal hemorrhoids, no pain. She has brown stool that is guaiac negative. Plan to admit the patient to the hospital for continued monitoring.  At this time, I do not see any evidence of an acute or sweats to bleed, so Protonix is not indicated.  ____________________________________________  FINAL CLINICAL IMPRESSION(S) / ED DIAGNOSES  Final diagnoses:  Generalized weakness  Anemia, unspecified anemia type      NEW MEDICATIONS STARTED DURING THIS VISIT:  New Prescriptions   No medications on file     Eula Listen, MD 10/26/15 1502

## 2015-10-26 NOTE — ED Notes (Signed)
Pt to ED from home via [facility] c/o weakness. Per pt she has been feeling weak since Saturday having episodes in which she felt hot & diaphoretic, and unable to stand. EMS reports pt c/o headache above left eye today that she received tylenol from her son for that has since eased off. EMS also reports hx of stroke with left sided weakness as baseline. Pt alert and oriented in no acute distress at this time.

## 2015-10-26 NOTE — Consult Note (Signed)
ANTICOAGULATION CONSULT NOTE - Initial Consult  Pharmacy Consult for warfarin Indication: hx of CVA  No Known Allergies  Patient Measurements: Height: 5\' 5"  (165.1 cm) Weight: 121 lb (54.885 kg) IBW/kg (Calculated) : 57 Heparin Dosing Weight:   Vital Signs: Temp: 98.3 F (36.8 C) (06/26 1258) Temp Source: Oral (06/26 1258) BP: 133/59 mmHg (06/26 1258) Pulse Rate: 73 (06/26 1258)  Labs:  Recent Labs  10/26/15 1323  HGB 7.3*  HCT 24.2*  PLT 233  APTT 42*  LABPROT 26.0*  INR 2.41  CREATININE 0.59  TROPONINI <0.03    Estimated Creatinine Clearance: 55.9 mL/min (by C-G formula based on Cr of 0.59).   Medical History: Past Medical History  Diagnosis Date  . Stroke ([place])   . Hypertension   . Seizures ([place])   . COPD (chronic obstructive pulmonary disease) ([place])   . Coronary artery disease   . Allergy   . Depression   . Emphysema of lung ([place])   . GERD (gastroesophageal reflux disease)   . Hyperlipidemia     Medications:  Scheduled:  . acetaminophen  650 mg Oral Once  . aspirin EC  81 mg Oral Daily  . atenolol  50 mg Oral BID  . atorvastatin  10 mg Oral Daily  . diphenhydrAMINE  25 mg Oral Once  . gabapentin  300 mg Oral BID  . levETIRAcetam  1,000 mg Oral BID  . mometasone-formoterol  2 puff Inhalation BID  . nicotine  14 mg Transdermal Daily  . pantoprazole  40 mg Oral BID  . sertraline  100 mg Oral BID  . tiotropium  18 mcg Inhalation Daily  . warfarin  2.5 mg Oral Once per day on Mon Tue Wed Fri Sat  . [START ON 10/29/2015] warfarin  5 mg Oral Once per day on Sun Thu  . Warfarin - Pharmacist Dosing Inpatient   Does not apply q1800    Assessment: Pt is a 70 year old female with a hx of CVA on warfarin at home. Pt does have a hx of gastric ulcers, non bleeding. Hgb has dropped since her transfusion last admission in April but her stool is neg for blood. Pt had stopped taking her iron supplements.  INR on admission is therapeutic at 2.41. Home dose is  5mg  on Thus and Sun and 2.5mg  all other days.  Goal of Therapy:  INR 2-3 Monitor platelets by anticoagulation protocol: Yes   Plan:  Continue pt home dose of warfarin 5mg  on Thurs and Sun and 2.5mg  all other days. Recheck INR in the AM. Thank you for the consult.  Ramond Dial, Pharm.D Clinical Pharmacist 10/26/2015,4:17 PM

## 2015-10-27 LAB — TYPE AND SCREEN
ABO/RH(D): O NEG
Antibody Screen: NEGATIVE
UNIT DIVISION: 0

## 2015-10-27 LAB — CBC
HCT: 27.4 % — ABNORMAL LOW (ref 35.0–47.0)
HEMATOCRIT: 25.6 % — AB (ref 35.0–47.0)
HEMOGLOBIN: 8.5 g/dL — AB (ref 12.0–16.0)
Hemoglobin: 8 g/dL — ABNORMAL LOW (ref 12.0–16.0)
MCH: 22.3 pg — AB (ref 26.0–34.0)
MCH: 22.7 pg — ABNORMAL LOW (ref 26.0–34.0)
MCHC: 31 g/dL — ABNORMAL LOW (ref 32.0–36.0)
MCHC: 31.4 g/dL — ABNORMAL LOW (ref 32.0–36.0)
MCV: 71.8 fL — ABNORMAL LOW (ref 80.0–100.0)
MCV: 72.5 fL — ABNORMAL LOW (ref 80.0–100.0)
PLATELETS: 205 10*3/uL (ref 150–440)
Platelets: 198 10*3/uL (ref 150–440)
RBC: 3.53 MIL/uL — ABNORMAL LOW (ref 3.80–5.20)
RBC: 3.81 MIL/uL (ref 3.80–5.20)
RDW: 20.2 % — ABNORMAL HIGH (ref 11.5–14.5)
RDW: 20.2 % — AB (ref 11.5–14.5)
WBC: 5.6 10*3/uL (ref 3.6–11.0)
WBC: 5.9 10*3/uL (ref 3.6–11.0)

## 2015-10-27 LAB — PROTIME-INR
INR: 2.5
PROTHROMBIN TIME: 26.7 s — AB (ref 11.4–15.0)

## 2015-10-27 NOTE — Care Management Obs Status (Signed)
[place] NOTIFICATION   Patient Details  Name: Michele Matthews MRN: [id number] Date of Birth: 1945-07-09   Medicare Observation Status Notification Given:  Yes    Shelbie Ammons, RN 10/27/2015, 11:08 AM

## 2015-10-27 NOTE — Consult Note (Signed)
ANTICOAGULATION CONSULT NOTE - Initial Consult  Pharmacy Consult for warfarin Indication: hx of CVA  No Known Allergies  Patient Measurements: Height: 5\' 5"  (165.1 cm) Weight: 123 lb (55.792 kg) IBW/kg (Calculated) : 57  Vital Signs: Temp: 97.7 F (36.5 C) (06/27 0422) Temp Source: Oral (06/27 0422) BP: 156/62 mmHg (06/27 0422) Pulse Rate: 71 (06/27 0422)   Recent Labs  10/26/15 1323 10/27/15 0012 10/27/15 0416  HGB 7.3* 8.0* 8.5*  HCT 24.2* 25.6* 27.4*  PLT 233 205 198  APTT 42*  --   --   LABPROT 26.0*  --  26.7*  INR 2.41  --  2.50  CREATININE 0.59  --   --   TROPONINI <0.03  --   --     Estimated Creatinine Clearance: 56.8 mL/min (by C-G formula based on Cr of 0.59).   Medical History: Past Medical History  Diagnosis Date  . Stroke ([place])   . Hypertension   . Seizures ([place])   . COPD (chronic obstructive pulmonary disease) ([place])   . Coronary artery disease   . Allergy   . Depression   . Emphysema of lung ([place])   . GERD (gastroesophageal reflux disease)   . Hyperlipidemia     Medications:  Scheduled:  . aspirin EC  81 mg Oral Daily  . atenolol  50 mg Oral BID  . atorvastatin  10 mg Oral Daily  . gabapentin  300 mg Oral BID  . levETIRAcetam  1,000 mg Oral BID  . mometasone-formoterol  2 puff Inhalation BID  . nicotine  14 mg Transdermal Daily  . pantoprazole  40 mg Oral BID  . sertraline  100 mg Oral BID  . tiotropium  18 mcg Inhalation Daily  . warfarin  2.5 mg Oral Once per day on Mon Tue Wed Fri Sat  . [START ON 10/29/2015] warfarin  5 mg Oral Once per day on Sun Thu  . Warfarin - Pharmacist Dosing Inpatient   Does not apply q1800    Assessment: Pt is a 70 year old female with a hx of CVA on warfarin at home. Pt does have a hx of gastric ulcers, non bleeding. Hgb has dropped since her transfusion last admission in April but her stool is neg for blood. Pt had stopped taking her iron supplements.  INR on admission is therapeutic at 2.41. Home  dose is 5mg  on Thus and Sun and 2.5mg  all other days.  Goal of Therapy:  INR 2-3 Monitor platelets by anticoagulation protocol: Yes   Plan:  INR today therapeutic at 2.5. Will continue pt home dose of warfarin 5mg  on Thurs and Sun and 2.5mg  all other days.  Will continue to recheck INR with AM labs. Thank you for the consult.  Nancy Fetter, PharmD Clinical Pharmacist 10/27/2015 7:48 AM

## 2015-10-27 NOTE — Plan of Care (Signed)
Problem: Safety: Goal: Ability to remain free from injury will improve Outcome: Progressing Chair today tol well amb with p.t. And  Did stairs tol well   Problem: Activity: Goal: Risk for activity intolerance will decrease Outcome: Progressing See above note  Problem: Nutrition: Goal: Adequate nutrition will be maintained Outcome: Progressing tol 100% of meals

## 2015-10-27 NOTE — Care Management (Signed)
Physical therapy evaluation completed. Recommends home with home health/physical therapy, Discussed Home Health agencies at the bedside. States she has had [facility] in the past, would like [facility] this time.  Uses a rolling walker to aid in ambulation. Family will transport. Shelbie Ammons RN MSN CCM Care Management [phone]

## 2015-10-27 NOTE — Evaluation (Signed)
Physical Therapy Evaluation Patient Details Name: Michele Matthews MRN: [id number] DOB: Jun 30, 1945 Today's Date: 10/27/2015   History of Present Illness  70 yo F presented to ED with c/o weakness, found to be anemic. PMH includes CVA with L sided weakness, seizures, COPD and CAD.  Clinical Impression  Pt demonstrated generalized weakness and difficulty walking during evaluation. PLOF is I with household ambulation with FWW. She currently requires min guard for transfers and ambulation up to 110 ft with FWW. Cues for safety awareness provided throughout. She navigated 3 steps with R rail with min guard. No LOB during session. HHPT recommended after hospital discharge to address deficits of strength, endurance, gait and balance to progress towards PLOF. Pt will benefit from skilled PT services to increase functional I and mobility for safe discharge.     Follow Up Recommendations Home health PT;Supervision - Intermittent    Equipment Recommendations  None recommended by PT (pt has recommended FWW)    Recommendations for Other Services       Precautions / Restrictions Precautions Precautions: Fall Restrictions Weight Bearing Restrictions: No      Mobility  Bed Mobility Overal bed mobility: Independent                Transfers Overall transfer level: Needs assistance Equipment used: Rolling walker (2 wheeled) Transfers: Sit to/from [facility] Sit to Stand: Min guard Stand pivot transfers: Min guard       General transfer comment: cues for hand placement and anterior weight shifting  Ambulation/Gait Ambulation/Gait assistance: Min guard Ambulation Distance (Feet): 110 Feet Assistive device: Rolling walker (2 wheeled) Gait Pattern/deviations: Decreased stride length;Decreased dorsiflexion - left;Trunk flexed;Narrow base of support Gait velocity: reduced Gait velocity interpretation: <1.8 ft/sec, indicative of risk for recurrent falls General Gait  Details: slow but steady gait with no LOB  Stairs Stairs: Yes Stairs assistance: Min guard Stair Management: One rail Right Number of Stairs: 3 General stair comments: Cues for placing both hands on R rail for increased support and to not come down steps backwards to prevent missing the step  Wheelchair Mobility    Modified Rankin (Stroke Patients Only)       Balance Overall balance assessment: Needs assistance Sitting-balance support: Feet supported Sitting balance-Leahy Scale: Normal     Standing balance support: Bilateral upper extremity supported Standing balance-Leahy Scale: Fair Standing balance comment: maintains with no LOB                             Pertinent Vitals/Pain Pain Assessment: No/denies pain    Home Living Family/patient expects to be discharged to:: Private residence Living Arrangements: Children;Spouse/significant other Available Help at Discharge: Family;Available PRN/intermittently Type of Home: House Home Access: Stairs to enter Entrance Stairs-Rails: Right Entrance Stairs-Number of Steps: 3 Home Layout: One level Home Equipment: Walker - 2 wheels      Prior Function Level of Independence: Independent with assistive device(s)         Comments: Ambulates household distances with FWW, does not drive     Hand Dominance   Dominant Hand: Right    Extremity/Trunk Assessment   Upper Extremity Assessment: Generalized weakness (grossly 4/5)           Lower Extremity Assessment: Generalized weakness (grossly 4/5)         Communication   Communication: HOH  Cognition Arousal/Alertness: Awake/alert Behavior During Therapy: WFL for tasks assessed/performed Overall Cognitive Status: Within Functional Limits for tasks assessed  General Comments General comments (skin integrity, edema, etc.): sores on back of neck from prior shingles, RN aware    Exercises Other Exercises Other  Exercises: Pt navigated 3 steps x2 with B rails then only R rail with min guard. Cues for safe technique. Steady with no LOB.      Assessment/Plan    PT Assessment Patient needs continued PT services  PT Diagnosis Difficulty walking;Generalized weakness   PT Problem List Decreased strength;Decreased activity tolerance;Decreased balance;Decreased knowledge of use of DME;Decreased safety awareness  PT Treatment Interventions DME instruction;Gait training;Stair training;Therapeutic activities;Therapeutic exercise;Balance training;Neuromuscular re-education;Patient/family education   PT Goals (Current goals can be found in the Care Plan section) Acute Rehab PT Goals Patient Stated Goal: to go home PT Goal Formulation: With patient/family Time For Goal Achievement: 11/10/15 Potential to Achieve Goals: Good    Frequency Min 2X/week   Barriers to discharge Inaccessible home environment steps to enter    Co-evaluation               End of Session Equipment Utilized During Treatment: Gait belt Activity Tolerance: Patient tolerated treatment well Patient left: in chair;with call bell/phone within reach;with chair alarm set Nurse Communication: Mobility status    Functional Assessment Tool Used: clinical judgement, gait speed Functional Limitation: Mobility: Walking and moving around Mobility: Walking and Moving Around Current Status [phone]): At least 1 percent but less than 20 percent impaired, limited or restricted Mobility: Walking and Moving Around Goal Status [phone]): 0 percent impaired, limited or restricted    Time: 0843-0906 PT Time Calculation (min) (ACUTE ONLY): 23 min   Charges:   PT Evaluation $PT Eval Low Complexity: 1 Procedure PT Treatments $Therapeutic Activity: 8-22 mins   PT G Codes:   PT G-Codes **NOT FOR INPATIENT CLASS** Functional Assessment Tool Used: clinical judgement, gait speed Functional Limitation: Mobility: Walking and moving around Mobility:  Walking and Moving Around Current Status [phone]): At least 1 percent but less than 20 percent impaired, limited or restricted Mobility: Walking and Moving Around Goal Status [phone]): 0 percent impaired, limited or restricted    Neoma Laming, PT, DPT  10/27/2015, 9:29 AM [phone]

## 2015-10-27 NOTE — Progress Notes (Signed)
Subjective: Admitted with symptomatic anemia. Feels better after blood transfusion. Still complains of tunnel vision.  Objective: Vital signs in last 24 hours: Temp:  [97.7 F (36.5 C)-98.4 F (36.9 C)] 98.1 F (36.7 C) (06/27 1405) Pulse Rate:  [71-77] 72 (06/27 1405) Resp:  [17-23] 20 (06/27 1405) BP: (125-163)/(48-62) 125/48 mmHg (06/27 1405) SpO2:  [96 %-100 %] 97 % (06/27 1405) Weight:  [55.792 kg (123 lb)-56.745 kg (125 lb 1.6 oz)] 55.792 kg (123 lb) (06/27 0422) Weight change:  Last BM Date: 10/26/15  Intake/Output from previous day: 06/26 0701 - 06/27 0700 In: 660 [P.O.:120; I.V.:200; Blood:340] Out: -  Intake/Output this shift: Total I/O In: 480 [P.O.:480] Out: -   General appearance: alert Head: Normocephalic, without obvious abnormality, atraumatic Eyes: conjunctivae/corneas clear. PERRL, EOM's intact. Fundi benign. Resp: clear to auscultation bilaterally and normal percussion bilaterally Cardio: regular rate and rhythm, S1, S2 normal, no murmur, click, rub or gallop GI: soft, non-tender; bowel sounds normal; no masses,  no organomegaly Extremities: extremities normal, atraumatic, no cyanosis or edema Neurologic: Alert and oriented X 3, normal strength and tone. Normal symmetric reflexes. Normal coordination and gait  Lab Results:  Recent Labs  10/27/15 0012 10/27/15 0416  WBC 5.9 5.6  HGB 8.0* 8.5*  HCT 25.6* 27.4*  PLT 205 198   BMET  Recent Labs  10/26/15 1323  NA 138  K 3.9  CL 108  CO2 24  GLUCOSE 93  BUN 15  CREATININE 0.59  CALCIUM 8.9    Studies/Results: Dg Chest 2 View  10/26/2015  CLINICAL DATA:  Weakness EXAM: CHEST  2 VIEW COMPARISON:  08/09/2015 FINDINGS: Mild peribronchial thickening. Heart and mediastinal contours are within normal limits. No focal opacities or effusions. No acute bony abnormality. Atherosclerotic calcifications in the arch descending thoracic aorta. IMPRESSION: Mild bronchitic changes. Aortic atherosclerosis.  Electronically Signed   By: Rolm Baptise M.D.   On: 10/26/2015 14:22   Ct Head Wo Contrast  10/26/2015  CLINICAL DATA:  Headache and altered mental status EXAM: CT HEAD WITHOUT CONTRAST TECHNIQUE: Contiguous axial images were obtained from the base of the skull through the vertex without intravenous contrast. COMPARISON:  Head CT and brain MRI December 10, 2014 FINDINGS: Brain: There is mild diffuse atrophy. There is no intracranial mass, hemorrhage, extra-axial fluid collection, or midline shift. There is evidence of a prior infarct in the medial left occipital lobe, stable. There is evidence of prior infarct involving the posterior right frontal lobe, superior right temporal lobe, and anterior to mid right parietal lobe, stable. There is mild small vessel disease in the centra semiovale bilaterally. There is no new gray-white compartment lesion. No acute infarct evident. Vascular: There are foci of calcification in the cavernous carotid arteries bilaterally. There are also foci of calcification in the distal vertebral arteries bilaterally, more on the left than on the right. Skull: The bony calvarium appears intact. Sinuses/Orbits: Orbits appear symmetric bilaterally. Visualized paranasal sinuses are clear. Other: Mastoid air cells are clear. IMPRESSION: Atrophy with stable prior infarcts and mild periventricular small vessel disease. No acute infarct evident. No hemorrhage or mass effect. Foci of atherosclerotic vascular calcification. Electronically Signed   By: Lowella Grip III M.D.   On: 10/26/2015 14:10    Medications:  I have reviewed the patient's current medications. Scheduled: . aspirin EC  81 mg Oral Daily  . atenolol  50 mg Oral BID  . atorvastatin  10 mg Oral Daily  . gabapentin  300 mg Oral BID  .  levETIRAcetam  1,000 mg Oral BID  . mometasone-formoterol  2 puff Inhalation BID  . nicotine  14 mg Transdermal Daily  . pantoprazole  40 mg Oral BID  . sertraline  100 mg Oral BID  .  tiotropium  18 mcg Inhalation Daily  . warfarin  2.5 mg Oral Once per day on Mon Tue Wed Fri Sat  . [START ON 10/29/2015] warfarin  5 mg Oral Once per day on Sun Thu  . Warfarin - Pharmacist Dosing Inpatient   Does not apply q1800   Continuous:   Assessment/Plan: 1. Symptomatic Chronic Blood Loss Anemia: Better after transfusion. Continueing on coumadin for now. Will recheck HgB in am. If unchanged or higher then d/c home. If lower stop coumadin and consult GI for endoscopy.  2. Weakness; Secondary to anemia. Better today.  3. Tunnel Vision: CT negative. Has been going on for two weeks. Will need follow up with opthomology.  4. HTN: Controlled.   Time= 30 min     Baxter Hire 10/27/2015, 2:20 PM

## 2015-10-28 LAB — PROTIME-INR
INR: 1.82
Prothrombin Time: 21 seconds — ABNORMAL HIGH (ref 11.4–15.0)

## 2015-10-28 NOTE — Care Management (Signed)
Discharge to home today per Dr. Benjie Karvonen. Will be followed by [facility]. Family will transport. Shelbie Ammons RN MSN CCM Care Management [phone]

## 2015-10-28 NOTE — Progress Notes (Signed)
Pt for discharge home with spouse. Alert/ no resp distress or c/o. Instructions discussed with pt. No new  Presc, home meds discussed, diet / activity and f/u discussed.  Sl d/cd. Ready for discharge.verbalize understanding of discharge plans

## 2015-10-28 NOTE — Consult Note (Signed)
ANTICOAGULATION CONSULT NOTE - Initial Consult  Pharmacy Consult for warfarin Indication: hx of CVA  No Known Allergies  Patient Measurements: Height: 5\' 5"  (165.1 cm) Weight: 116 lb 5 oz (52.759 kg) IBW/kg (Calculated) : 57  Vital Signs: Temp: 98.4 F (36.9 C) (06/28 0438) Temp Source: Oral (06/28 0438) BP: 136/58 mmHg (06/28 0438) Pulse Rate: 71 (06/28 0438)   Recent Labs  10/26/15 1323 10/27/15 0012 10/27/15 0416 10/28/15 0440  HGB 7.3* 8.0* 8.5*  --   HCT 24.2* 25.6* 27.4*  --   PLT 233 205 198  --   APTT 42*  --   --   --   LABPROT 26.0*  --  26.7* 21.0*  INR 2.41  --  2.50 1.82  CREATININE 0.59  --   --   --   TROPONINI <0.03  --   --   --     Estimated Creatinine Clearance: 53.8 mL/min (by C-G formula based on Cr of 0.59).   Medical History: Past Medical History  Diagnosis Date  . Stroke ([place])   . Hypertension   . Seizures ([place])   . COPD (chronic obstructive pulmonary disease) ([place])   . Coronary artery disease   . Allergy   . Depression   . Emphysema of lung ([place])   . GERD (gastroesophageal reflux disease)   . Hyperlipidemia     Medications:  Scheduled:  . aspirin EC  81 mg Oral Daily  . atenolol  50 mg Oral BID  . atorvastatin  10 mg Oral Daily  . gabapentin  300 mg Oral BID  . levETIRAcetam  1,000 mg Oral BID  . mometasone-formoterol  2 puff Inhalation BID  . nicotine  14 mg Transdermal Daily  . pantoprazole  40 mg Oral BID  . sertraline  100 mg Oral BID  . tiotropium  18 mcg Inhalation Daily  . warfarin  2.5 mg Oral Once per day on Mon Tue Wed Fri Sat  . [START ON 10/29/2015] warfarin  5 mg Oral Once per day on Sun Thu  . Warfarin - Pharmacist Dosing Inpatient   Does not apply q1800    Assessment: Pt is a 70 year old female with a hx of CVA on warfarin at home. Pt does have a hx of gastric ulcers, non bleeding. Hgb has dropped since her transfusion last admission in April but her stool is neg for blood. Pt had stopped taking her iron  supplements.  INR on admission is therapeutic at 2.41. Home dose is 5mg  on Thus and Sun and 2.5mg  all other days.  Goal of Therapy:  INR 2-3 Monitor platelets by anticoagulation protocol: Yes   Plan:  INR today subtherapeutic at 1.82, possibly due to transfusion.  Will continue pt home dose of warfarin 5mg  on Thurs and Sun and 2.5mg  all other days.  Will continue to monitor INR with AM labs. Thank you for the consult.  Nancy Fetter, PharmD Clinical Pharmacist 10/28/2015 9:06 AM

## 2015-10-28 NOTE — Discharge Summary (Signed)
Michele Matthews NAME: Michele Matthews    MR#:  [id number]  DATE OF BIRTH:  09-Aug-1945  DATE OF ADMISSION:  10/26/2015 ADMITTING PHYSICIAN: Hillary Bow, MD  DATE OF DISCHARGE: 10/28/2015  PRIMARY CARE PHYSICIAN: Amy Annamary Rummage, NP    ADMISSION DIAGNOSIS:  Generalized weakness [R53.1] Anemia, unspecified anemia type [D64.9]  DISCHARGE DIAGNOSIS:  Active Problems:   Anemia   SECONDARY DIAGNOSIS:   Past Medical History  Diagnosis Date  . Stroke ([place])   . Hypertension   . Seizures ([place])   . COPD (chronic obstructive pulmonary disease) ([place])   . Coronary artery disease   . Allergy   . Depression   . Emphysema of lung ([place])   . GERD (gastroesophageal reflux disease)   . Hyperlipidemia     HOSPITAL COURSE:   70 year old female with history of gastric ulcers, CVA and Coumadin and hypertension who presented with generalized weakness and dark colored stools. For further details please further H&P.  1. Symptomatic acute on chronic blood loss anemia: On admission stool occult was negative. She was transfused 1 unit of PRBCs and her symptoms have improved. Hemoglobin remained stable.  2. Generalized weakness: This is due to anemia.  3. Tunnel vision: CT scan was negative. This is been chronic over the past few weeks. She was advised to see ophthalmologist.  4. Essential hypertension: She will continue atenolol and lisinopril.  5. Hyperlipidemia: Continue Lipitor.  6. History CVA: Continue atorvastatin and Coumadin. INR discharge was less than 2 so she will need close INR follow-up by her primary care physician.  7. History of seizure: She will continue Keppra.  8. History depression: Continue Zoloft  9. Tobacco dependence: Patient is encouraged to stop smoking. Patient was counseled for 3 minutes at discharge.  DISCHARGE CONDITIONS AND DIET:   Stable for discharge on heart healthy diet  CONSULTS OBTAINED:     DRUG  ALLERGIES:  No Known Allergies  DISCHARGE MEDICATIONS:   Current Discharge Medication List    CONTINUE these medications which have NOT CHANGED   Details  acetaminophen (TYLENOL) 325 MG tablet Take 650 mg by mouth every 6 (six) hours as needed for mild pain, fever or headache.    albuterol (PROVENTIL HFA;VENTOLIN HFA) 108 (90 Base) MCG/ACT inhaler Inhale 2 puffs into the lungs every 6 (six) hours as needed for wheezing or shortness of breath. Qty: 1 Inhaler, Refills: 11   Associated Diagnoses: Centrilobular emphysema (HCC)    aspirin EC 81 MG tablet Take 81 mg by mouth daily.    atenolol (TENORMIN) 50 MG tablet Take 50 mg by mouth 2 (two) times daily.    atorvastatin (LIPITOR) 10 MG tablet Take 10 mg by mouth daily.    Fluticasone-Salmeterol (ADVAIR) 250-50 MCG/DOSE AEPB Inhale 1 puff into the lungs 2 (two) times daily. Qty: 60 each, Refills: 11   Associated Diagnoses: Centrilobular emphysema (HCC)    gabapentin (NEURONTIN) 300 MG capsule Take 300 mg by mouth 2 (two) times daily.     levETIRAcetam (KEPPRA) 1000 MG tablet Take 1,000 mg by mouth 2 (two) times daily.    lisinopril (PRINIVIL,ZESTRIL) 20 MG tablet Take 20 mg by mouth daily as needed (for increased BP).     LORazepam (ATIVAN) 0.5 MG tablet Take 0.5 mg by mouth 3 (three) times daily.    nicotine (NICODERM CQ - DOSED IN MG/24 HOURS) 14 mg/24hr patch Place 1 patch (14 mg total) onto the skin daily. Qty: 28 patch, Refills:  0    pantoprazole (PROTONIX) 40 MG tablet Take 1 tablet (40 mg total) by mouth 2 (two) times daily. Qty: 60 tablet, Refills: 0    sertraline (ZOLOFT) 100 MG tablet Take 100 mg by mouth 2 (two) times daily.    tiotropium (SPIRIVA) 18 MCG inhalation capsule Place 1 capsule (18 mcg total) into inhaler and inhale daily. Qty: 30 capsule, Refills: 11   Associated Diagnoses: Centrilobular emphysema ([place])    !! warfarin (COUMADIN) 2.5 MG tablet Take 2.5 mg by mouth every evening. Pt only takes this dose  on Monday, Tuesday, Wednesday, Friday, and Saturday.    !! warfarin (COUMADIN) 5 MG tablet Take 5 mg by mouth every evening. Pt only takes this dose on Thursday and Sunday.     !! - Potential duplicate medications found. Please discuss with provider.            Today   CHIEF COMPLAINT:  Patient doing well this morning. Still complaining of tunnel vision which has been going on for a few weeks. Denies any headache or blurred vision.   VITAL SIGNS:  Blood pressure 136/58, pulse 71, temperature 98.4 F (36.9 C), temperature source Oral, resp. rate 18, height 5\' 5"  (1.651 m), weight 52.759 kg (116 lb 5 oz), SpO2 97 %.   REVIEW OF SYSTEMS:  Review of Systems  Constitutional: Negative for fever, chills and malaise/fatigue.  HENT: Negative for ear discharge, ear pain, hearing loss, nosebleeds and sore throat.        Tunnel vision  Eyes: Negative for blurred vision and pain.  Respiratory: Negative for cough, hemoptysis, shortness of breath and wheezing.   Cardiovascular: Negative for chest pain, palpitations and leg swelling.  Gastrointestinal: Negative for nausea, vomiting, abdominal pain, diarrhea and blood in stool.  Genitourinary: Negative for dysuria.  Musculoskeletal: Negative for back pain.  Neurological: Negative for dizziness, tremors, speech change, focal weakness, seizures and headaches.  Endo/Heme/Allergies: Does not bruise/bleed easily.  Psychiatric/Behavioral: Negative for depression, suicidal ideas and hallucinations.     PHYSICAL EXAMINATION:  GENERAL:  70 y.o.-year-old patient lying in the bed with no acute distress.  NECK:  Supple, no jugular venous distention. No thyroid enlargement, no tenderness.  LUNGS: Normal breath sounds bilaterally, no wheezing, rales,rhonchi  No use of accessory muscles of respiration.  CARDIOVASCULAR: S1, S2 normal. No murmurs, rubs, or gallops.  ABDOMEN: Soft, non-tender, non-distended. Bowel sounds present. No organomegaly or  mass.  EXTREMITIES: No pedal edema, cyanosis, or clubbing.  PSYCHIATRIC: The patient is alert and oriented x 3.  SKIN: No obvious rash, lesion, or ulcer.   DATA REVIEW:   CBC  Recent Labs Lab 10/27/15 0416  WBC 5.6  HGB 8.5*  HCT 27.4*  PLT 198    Chemistries   Recent Labs Lab 10/26/15 1323  NA 138  K 3.9  CL 108  CO2 24  GLUCOSE 93  BUN 15  CREATININE 0.59  CALCIUM 8.9  AST 29  ALT 14  ALKPHOS 51  BILITOT 0.4    Cardiac Enzymes  Recent Labs Lab 10/26/15 1323  TROPONINI <0.03    Microbiology Results  @[email or web address] @  RADIOLOGY:  Dg Chest 2 View  10/26/2015  CLINICAL DATA:  Weakness EXAM: CHEST  2 VIEW COMPARISON:  08/09/2015 FINDINGS: Mild peribronchial thickening. Heart and mediastinal contours are within normal limits. No focal opacities or effusions. No acute bony abnormality. Atherosclerotic calcifications in the arch descending thoracic aorta. IMPRESSION: Mild bronchitic changes. Aortic atherosclerosis. Electronically Signed   By: Rolm Baptise  M.D.   On: 10/26/2015 14:22   Ct Head Wo Contrast  10/26/2015  CLINICAL DATA:  Headache and altered mental status EXAM: CT HEAD WITHOUT CONTRAST TECHNIQUE: Contiguous axial images were obtained from the base of the skull through the vertex without intravenous contrast. COMPARISON:  Head CT and brain MRI December 10, 2014 FINDINGS: Brain: There is mild diffuse atrophy. There is no intracranial mass, hemorrhage, extra-axial fluid collection, or midline shift. There is evidence of a prior infarct in the medial left occipital lobe, stable. There is evidence of prior infarct involving the posterior right frontal lobe, superior right temporal lobe, and anterior to mid right parietal lobe, stable. There is mild small vessel disease in the centra semiovale bilaterally. There is no new gray-white compartment lesion. No acute infarct evident. Vascular: There are foci of calcification in the cavernous carotid arteries bilaterally.  There are also foci of calcification in the distal vertebral arteries bilaterally, more on the left than on the right. Skull: The bony calvarium appears intact. Sinuses/Orbits: Orbits appear symmetric bilaterally. Visualized paranasal sinuses are clear. Other: Mastoid air cells are clear. IMPRESSION: Atrophy with stable prior infarcts and mild periventricular small vessel disease. No acute infarct evident. No hemorrhage or mass effect. Foci of atherosclerotic vascular calcification. Electronically Signed   By: Lowella Grip III M.D.   On: 10/26/2015 14:10      Management plans discussed with the patient and she is in agreement. Stable for discharge home  Patient should follow up with PCP and OPTHO 1 week  CODE STATUS:     Code Status Orders        Start     Ordered   10/26/15 1531  Full code   Continuous     10/26/15 1532    Code Status History    Date Active Date Inactive Code Status Order ID Comments User Context   08/09/2015  4:15 AM 08/11/2015  6:16 PM Full Code [id number]  Harrie Foreman, MD ED   12/12/2014  2:34 PM 12/17/2014 12:57 AM DNR [id number]  Max Sane, MD Inpatient   12/10/2014  3:38 PM 12/12/2014  2:34 PM Full Code [id number]  Vaughan Basta, MD Inpatient      TOTAL TIME TAKING CARE OF THIS PATIENT: 36 minutes.    Note: This dictation was prepared with Dragon dictation along with smaller phrase technology. Any transcriptional errors that result from this process are unintentional.  Olesya Wike M.D on 10/28/2015 at 12:18 PM  Between 7am to 6pm - Pager - [phone] After 6pm go to [email or web address] - password [email or web address] [facility]  Office  [phone]  CC: Primary care physician; Amy Annamary Rummage, NP

## 2015-11-06 ENCOUNTER — Ambulatory Visit: Payer: Medicare HMO | Admitting: Family Medicine

## 2015-11-09 ENCOUNTER — Inpatient Hospital Stay: Payer: Medicare HMO | Admitting: Family Medicine

## 2015-11-12 ENCOUNTER — Inpatient Hospital Stay: Payer: Medicare HMO | Admitting: Family Medicine

## 2015-11-24 ENCOUNTER — Telehealth: Payer: Self-pay | Admitting: Family Medicine

## 2015-11-24 ENCOUNTER — Ambulatory Visit
Admission: RE | Admit: 2015-11-24 | Discharge: 2015-11-24 | Disposition: A | Discharge status: Home / Self Care | Payer: Medicare HMO | Source: Ambulatory Visit | Attending: Family Medicine | Admitting: Family Medicine

## 2015-11-24 ENCOUNTER — Other Ambulatory Visit: Payer: Self-pay | Admitting: Family Medicine

## 2015-11-24 ENCOUNTER — Ambulatory Visit (INDEPENDENT_AMBULATORY_CARE_PROVIDER_SITE_OTHER): Payer: Medicare HMO | Admitting: Family Medicine

## 2015-11-24 VITALS — BP 110/80 | HR 115 | Temp 98.7°F | Ht 66.0 in

## 2015-11-24 DIAGNOSIS — K254 Chronic or unspecified gastric ulcer with hemorrhage: Secondary | ICD-10-CM

## 2015-11-24 DIAGNOSIS — R202 Paresthesia of skin: Secondary | ICD-10-CM | POA: Diagnosis not present

## 2015-11-24 DIAGNOSIS — Z7901 Long term (current) use of anticoagulants: Secondary | ICD-10-CM

## 2015-11-24 DIAGNOSIS — D500 Iron deficiency anemia secondary to blood loss (chronic): Secondary | ICD-10-CM | POA: Diagnosis not present

## 2015-11-24 DIAGNOSIS — K922 Gastrointestinal hemorrhage, unspecified: Secondary | ICD-10-CM | POA: Diagnosis not present

## 2015-11-24 DIAGNOSIS — I700 Atherosclerosis of aorta: Secondary | ICD-10-CM | POA: Insufficient documentation

## 2015-11-24 DIAGNOSIS — J441 Chronic obstructive pulmonary disease with (acute) exacerbation: Secondary | ICD-10-CM | POA: Insufficient documentation

## 2015-11-24 DIAGNOSIS — K449 Diaphragmatic hernia without obstruction or gangrene: Secondary | ICD-10-CM | POA: Insufficient documentation

## 2015-11-24 DIAGNOSIS — R200 Anesthesia of skin: Secondary | ICD-10-CM

## 2015-11-24 DIAGNOSIS — M6289 Other specified disorders of muscle: Secondary | ICD-10-CM | POA: Diagnosis not present

## 2015-11-24 DIAGNOSIS — R531 Weakness: Secondary | ICD-10-CM

## 2015-11-24 DIAGNOSIS — D72829 Elevated white blood cell count, unspecified: Secondary | ICD-10-CM

## 2015-11-24 LAB — CBC WITH DIFFERENTIAL/PLATELET
BASOS PCT: 0 %
Basophils Absolute: 0 cells/uL (ref 0–200)
EOS ABS: 0 {cells}/uL — AB (ref 15–500)
Eosinophils Relative: 0 %
HEMATOCRIT: 32.9 % — AB (ref 35.0–45.0)
HEMOGLOBIN: 9.3 g/dL — AB (ref 11.7–15.5)
LYMPHS ABS: 959 {cells}/uL (ref 850–3900)
LYMPHS PCT: 7 %
MCH: 21.2 pg — ABNORMAL LOW (ref 27.0–33.0)
MCHC: 28.3 g/dL — ABNORMAL LOW (ref 32.0–36.0)
MCV: 74.9 fL — AB (ref 80.0–100.0)
MONO ABS: 685 {cells}/uL (ref 200–950)
MPV: 9.4 fL (ref 7.5–12.5)
Monocytes Relative: 5 %
Neutro Abs: 12056 cells/uL — ABNORMAL HIGH (ref 1500–7800)
Neutrophils Relative %: 88 %
Platelets: 576 10*3/uL — ABNORMAL HIGH (ref 140–400)
RBC: 4.39 MIL/uL (ref 3.80–5.10)
RDW: 21.8 % — AB (ref 11.0–15.0)
WBC: 13.7 10*3/uL — ABNORMAL HIGH (ref 3.8–10.8)

## 2015-11-24 LAB — PROTIME-INR
INR: 3.6 — AB
PROTHROMBIN TIME: 36.5 s — AB (ref 9.0–11.5)

## 2015-11-24 LAB — COMPLETE METABOLIC PANEL WITH GFR
ALBUMIN: 4.1 g/dL (ref 3.6–5.1)
ALK PHOS: 76 U/L (ref 33–130)
ALT: 11 U/L (ref 6–29)
AST: 22 U/L (ref 10–35)
BILIRUBIN TOTAL: 0.4 mg/dL (ref 0.2–1.2)
BUN: 15 mg/dL (ref 7–25)
CALCIUM: 9.3 mg/dL (ref 8.6–10.4)
CO2: 19 mmol/L — ABNORMAL LOW (ref 20–31)
CREATININE: 0.79 mg/dL (ref 0.60–0.93)
Chloride: 106 mmol/L (ref 98–110)
GFR, EST AFRICAN AMERICAN: 87 mL/min (ref >=60)
GFR, Est Non African American: 76 mL/min (ref >=60)
Glucose, Bld: 101 mg/dL — ABNORMAL HIGH (ref 65–99)
Potassium: 4.2 mmol/L (ref 3.5–5.3)
Sodium: 140 mmol/L (ref 135–146)
TOTAL PROTEIN: 7.6 g/dL (ref 6.1–8.1)

## 2015-11-24 MED ORDER — PREDNISONE 20 MG PO TABS: 40.0000 mg | ORAL_TABLET | Freq: Every day | ORAL | 0 refills | Status: DC

## 2015-11-24 MED ORDER — CEPHALEXIN 500 MG PO CAPS: 500.0000 mg | ORAL_CAPSULE | Freq: Two times a day (BID) | ORAL | 0 refills | Status: DC

## 2015-11-24 NOTE — Assessment & Plan Note (Signed)
Unclear if today's neurological deficits are worsening from previous stroke. Pt cannot remember if symptoms are worsening.

## 2015-11-24 NOTE — Telephone Encounter (Signed)
Reviewed labs with family- Hg and HCt are stable. She is still anemic. I think it is safe to continue warfarin until she is seen by GI on Tuesday.  Her WBC are elevated- she will need a stat CXR. Ordered for outpatient imaging.  Reinforced need to go to the ER with continued chest pain, weakness, numbness or any concerning symptoms.

## 2015-11-24 NOTE — Assessment & Plan Note (Signed)
Hold Warfarin pending stat lab work to determine if patient Hgb is dropping.

## 2015-11-24 NOTE — Assessment & Plan Note (Addendum)
Check Stat CBC today.  Made pt appt for follow-up with GI next Tuesday. Cautioned pt to hold warfarin until we can determine if she is bleeding. Take aspirin only.  Strongly urged pt to be seen in ER for work-up of possible bleeding consider ongoing fatigue, weakness, and similar presentation to last GI bleed. Pt has declined at this time.  Pt cautioned to go to ER for s/s of bleeding- including bloody vomit, dark stools, fatigue, weakness, or other concerning symptoms.

## 2015-11-24 NOTE — Progress Notes (Signed)
Subjective:    Patient ID: Michele Matthews, female    DOB: Aug 26, 1945, 70 y.o.   MRN: [id number]  HPI: Michele Matthews is a 70 y.o. female presenting on 11/24/2015 for Fatigue (her ulcer acting up as per nursing aid she might be detoxing and has GI bleeding pt is alcoholic and hasn't had alcohol long time)   HPI  Pt is here for acute visit. She has not followed following a GI bleed in the hospital on 4.10- missed 3 consecutive appts and was discharged from the practices. She was seen in the ER on 6/26 and given PRBC's for anemia and possible bleed. She did not follow with GI or PCP at the time. Marland Kitchen Her family thinks she might be detoxing from alcohol. Drink 2-3 liquor drinks bourbon (enough to cover ice) every day Last drink was last Thursday and she started reporting multiple symptoms.  Lips are numb and hands are numb- R leg is numb and R hand is numb- started on Thursday but she has had in the past but cannot remember if it was ever worked up. She does have a history of stroke for which she is taking coumadin.  Words not coming out right. Ever since Sunday. Having chest pain on the L side when she coughs.  She presents to ER with similar symptoms in April of this year   Not noticing dark stools. No blood in vomit. No bleeding gums or easy bruising. Is taking her blood thinner. Was told to stop taking her warfarin until she could follow with GI in April.  She no showed for several appts it is unclear when she restarted this medication again. It was reported as a medication she was taking in June of this year in the ER. An INR was checked.  COPD: Coughing up clear sputum. Chest pain with cough. Has been coughing. Has been using her inhalers. Harder to do usual activities because she is short of breath.   Past Medical History:  Diagnosis Date  . Allergy   . COPD (chronic obstructive pulmonary disease) ([place])   . Coronary artery disease   . Depression   . Emphysema of lung ([place])   . GERD  (gastroesophageal reflux disease)   . Hyperlipidemia   . Hypertension   . Seizures ([place])   . Stroke [facility])     Current Outpatient Prescriptions on File Prior to Visit  Medication Sig  . albuterol (PROVENTIL HFA;VENTOLIN HFA) 108 (90 Base) MCG/ACT inhaler Inhale 2 puffs into the lungs every 6 (six) hours as needed for wheezing or shortness of breath.  Marland Kitchen aspirin EC 81 MG tablet Take 81 mg by mouth daily.  Marland Kitchen atenolol (TENORMIN) 50 MG tablet Take 50 mg by mouth 2 (two) times daily.  Marland Kitchen atorvastatin (LIPITOR) 10 MG tablet Take 10 mg by mouth daily.  . Fluticasone-Salmeterol (ADVAIR) 250-50 MCG/DOSE AEPB Inhale 1 puff into the lungs 2 (two) times daily.  Marland Kitchen gabapentin (NEURONTIN) 300 MG capsule Take 300 mg by mouth 2 (two) times daily.   Marland Kitchen levETIRAcetam (KEPPRA) 1000 MG tablet Take 1,000 mg by mouth 2 (two) times daily.  Marland Kitchen lisinopril (PRINIVIL,ZESTRIL) 20 MG tablet Take 20 mg by mouth daily as needed (for increased BP).   . LORazepam (ATIVAN) 0.5 MG tablet Take 0.5 mg by mouth 3 (three) times daily.  . nicotine (NICODERM CQ - DOSED IN MG/24 HOURS) 14 mg/24hr patch Place 1 patch (14 mg total) onto the skin daily.  . pantoprazole (PROTONIX) 40 MG  tablet Take 1 tablet (40 mg total) by mouth 2 (two) times daily.  . sertraline (ZOLOFT) 100 MG tablet Take 100 mg by mouth 2 (two) times daily.  Marland Kitchen tiotropium (SPIRIVA) 18 MCG inhalation capsule Place 1 capsule (18 mcg total) into inhaler and inhale daily.  Marland Kitchen warfarin (COUMADIN) 2.5 MG tablet Take 2.5 mg by mouth every evening. Pt only takes this dose on Monday, Tuesday, Wednesday, Friday, and Saturday.  . warfarin (COUMADIN) 5 MG tablet Take 5 mg by mouth every evening. Pt only takes this dose on Thursday and Sunday.   No current facility-administered medications on file prior to visit.     Review of Systems  Constitutional: Positive for fatigue.  Eyes: Negative for visual disturbance.  Respiratory: Positive for cough and shortness of breath.     Cardiovascular: Positive for chest pain.  Gastrointestinal: Negative for abdominal distention, abdominal pain, blood in stool and rectal pain.  Endocrine: Negative for polydipsia, polyphagia and polyuria.  Genitourinary: Negative.   Musculoskeletal: Negative.   Skin: Negative.   Neurological: Positive for dizziness, weakness and numbness.  Psychiatric/Behavioral: Negative for suicidal ideas. The patient is nervous/anxious.    Per HPI unless specifically indicated above     Objective:    BP 110/80 (BP Location: Left Arm, Patient Position: Sitting, Cuff Size: Normal)   Pulse (!) 115   Temp 98.7 F (37.1 C) (Oral)   Ht 5\' 6"  (1.676 m)   Wt Readings from Last 3 Encounters:  10/28/15 116 lb 5 oz (52.8 kg)  08/11/15 124 lb 12.8 oz (56.6 kg)  08/07/15 118 lb (53.5 kg)    Physical Exam  Constitutional: She is oriented to person, place, and time. She appears well-developed and well-nourished.  HENT:  Head: Normocephalic and atraumatic.  Eyes: EOM are normal.  Neck: Neck supple.  Cardiovascular: Regular rhythm and normal heart sounds.  Tachycardia present.  Exam reveals no gallop and no friction rub.   No murmur heard. Pulmonary/Chest: Effort normal. She has decreased breath sounds in the right lower field and the left lower field. She has no wheezes. She has no rhonchi. She has no rales. Chest wall is not dull to percussion. She exhibits no tenderness.  Abdominal: Soft. Normal appearance and bowel sounds are normal. She exhibits no distension and no mass. There is no tenderness. There is no rebound and no guarding.  Musculoskeletal: Normal range of motion. She exhibits no edema or tenderness.  Lymphadenopathy:    She has no cervical adenopathy.  Neurological: She is alert and oriented to person, place, and time. No cranial nerve deficit or sensory deficit.  Reflex Scores:      Patellar reflexes are 2+ on the right side and 2+ on the left side. Strength 4/5 on the L side- residual  from previous stroke. Shuffling gait. Finger to nose intact.  Had difficulty with heel to shin on L side- unclear if this is due to previous stroke.    Skin: Skin is warm and dry.   Results for orders placed or performed during the hospital encounter of 10/26/15  CBC  Result Value Ref Range   WBC 5.7 3.6 - 11.0 K/uL   RBC 3.46 (L) 3.80 - 5.20 MIL/uL   Hemoglobin 7.3 (L) 12.0 - 16.0 g/dL   HCT 24.2 (L) 35.0 - 47.0 %   MCV 70.0 (L) 80.0 - 100.0 fL   MCH 21.0 (L) 26.0 - 34.0 pg   MCHC 30.0 (L) 32.0 - 36.0 g/dL   RDW 19.9 (H)  11.5 - 14.5 %   Platelets 233 150 - 440 K/uL  Comprehensive metabolic panel  Result Value Ref Range   Sodium 138 135 - 145 mmol/L   Potassium 3.9 3.5 - 5.1 mmol/L   Chloride 108 101 - 111 mmol/L   CO2 24 22 - 32 mmol/L   Glucose, Bld 93 65 - 99 mg/dL   BUN 15 6 - 20 mg/dL   Creatinine, Ser 0.59 0.44 - 1.00 mg/dL   Calcium 8.9 8.9 - 10.3 mg/dL   Total Protein 6.9 6.5 - 8.1 g/dL   Albumin 3.7 3.5 - 5.0 g/dL   AST 29 15 - 41 U/L   ALT 14 14 - 54 U/L   Alkaline Phosphatase 51 38 - 126 U/L   Total Bilirubin 0.4 0.3 - 1.2 mg/dL   GFR calc non Af Amer >60 >60 mL/min   GFR calc Af Amer >60 >60 mL/min   Anion gap 6 5 - 15  Troponin I  Result Value Ref Range   Troponin I <0.03 <0.031 ng/mL  Urinalysis complete, with microscopic ([facility] only)  Result Value Ref Range   Color, Urine YELLOW (A) YELLOW   APPearance HAZY (A) CLEAR   Glucose, UA NEGATIVE NEGATIVE mg/dL   Bilirubin Urine NEGATIVE NEGATIVE   Ketones, ur NEGATIVE NEGATIVE mg/dL   Specific Gravity, Urine 1.021 1.005 - 1.030   Hgb urine dipstick NEGATIVE NEGATIVE   pH 5.0 5.0 - 8.0   Protein, ur 30 (A) NEGATIVE mg/dL   Nitrite NEGATIVE NEGATIVE   Leukocytes, UA NEGATIVE NEGATIVE   RBC / HPF NONE SEEN 0 - 5 RBC/hpf   WBC, UA NONE SEEN 0 - 5 WBC/hpf   Bacteria, UA NONE SEEN NONE SEEN   Squamous Epithelial / LPF NONE SEEN NONE SEEN  APTT  Result Value Ref Range   aPTT 42 (H) 24 - 36 seconds   Protime-INR  Result Value Ref Range   Prothrombin Time 26.0 (H) 11.4 - 15.0 seconds   INR 2.41   CBC  Result Value Ref Range   WBC 5.9 3.6 - 11.0 K/uL   RBC 3.53 (L) 3.80 - 5.20 MIL/uL   Hemoglobin 8.0 (L) 12.0 - 16.0 g/dL   HCT 25.6 (L) 35.0 - 47.0 %   MCV 72.5 (L) 80.0 - 100.0 fL   MCH 22.7 (L) 26.0 - 34.0 pg   MCHC 31.4 (L) 32.0 - 36.0 g/dL   RDW 20.2 (H) 11.5 - 14.5 %   Platelets 205 150 - 440 K/uL  CBC  Result Value Ref Range   WBC 5.6 3.6 - 11.0 K/uL   RBC 3.81 3.80 - 5.20 MIL/uL   Hemoglobin 8.5 (L) 12.0 - 16.0 g/dL   HCT 27.4 (L) 35.0 - 47.0 %   MCV 71.8 (L) 80.0 - 100.0 fL   MCH 22.3 (L) 26.0 - 34.0 pg   MCHC 31.0 (L) 32.0 - 36.0 g/dL   RDW 20.2 (H) 11.5 - 14.5 %   Platelets 198 150 - 440 K/uL  Protime-INR  Result Value Ref Range   Prothrombin Time 26.7 (H) 11.4 - 15.0 seconds   INR 2.50   Protime-INR  Result Value Ref Range   Prothrombin Time 21.0 (H) 11.4 - 15.0 seconds   INR 1.82   Type and screen [place]  Result Value Ref Range   ABO/RH(D) O NEG    Antibody Screen NEG    Sample Expiration 10/29/2015    Unit Number [id number]    Blood  Component Type RBC, LR IRR    Unit division 00    Status of Unit ISSUED,FINAL    Transfusion Status OK TO TRANSFUSE    Crossmatch Result Compatible   Prepare RBC  Result Value Ref Range   Order Confirmation ORDER PROCESSED BY BLOOD BANK       Assessment & Plan:   Problem List Items Addressed This Visit      Digestive   Upper GI bleed    Check Stat CBC today.  Made pt appt for follow-up with GI next Tuesday. Cautioned pt to hold warfarin until we can determine if she is bleeding. Take aspirin only.  Strongly urged pt to be seen in ER for work-up of possible bleeding consider ongoing fatigue, weakness, and similar presentation to last GI bleed. Pt has declined at this time.  Pt cautioned to go to ER for s/s of bleeding- including bloody vomit, dark stools, fatigue, weakness, or other  concerning symptoms.       Relevant Orders   Ambulatory referral to Gastroenterology     Nervous and Auditory   Left-sided weakness    Unclear if today's neurological deficits are worsening from previous stroke. Pt cannot remember if symptoms are worsening.         Other   Long term current use of anticoagulant therapy    Hold Warfarin pending stat lab work to determine if patient Hgb is dropping.        Other Visit Diagnoses    COPD exacerbation ([place])    -  Primary   Likely COPD exacerbation. Pt given prednisone to help with breathing. Pt strongly advised to have R sideded chest pain evaluated in the ER. Pt declined.     Relevant Medications   predniSONE (DELTASONE) 20 MG tablet   Numbness and tingling of right arm and leg       Strongly encouraged pt and family to go to ER to be evaluated for these symptoms. Pt reluctant and would like to wait for lab work. Family believes this is not a new issue and did not feel she needed to go to ER at this time. Pt advised to go to the ER with continued numbness, worsening numbness.    Chronic blood loss anemia       Relevant Orders   Ambulatory referral to Gastroenterology      Meds ordered this encounter  Medications  . Acetaminophen 500 MG coapsule  . predniSONE (DELTASONE) 20 MG tablet    Sig: Take 2 tablets (40 mg total) by mouth daily with breakfast.    Dispense:  10 tablet    Refill:  0    Order Specific Question:   Supervising Provider    Answer:   Arlis Porta [id number]      Follow up plan: Return if symptoms worsen or fail to improve. Since patient was discharged from practice she was given a listing of PCP's in the area. Strongly advised pt to go to the ER for worsening symptoms.

## 2015-11-24 NOTE — Telephone Encounter (Signed)
Reviewed lab work and CXR. Is negative for pneumonia. Family is concerned she might have a UTI but did mention voiding symptoms at visit. Pt is reporting burning when she voids. Will treat elevated WBC with Keflex for lung and possible UTI. Pt to ER or urgent care with worsening symptoms. INR is 3.6. HOLD warfarin until Friday and resume dosing until she sees GI.

## 2015-11-24 NOTE — Patient Instructions (Addendum)
I am concerned about your symptoms today and believe you may need to be evaluated in the ER. I understand that you would like to wait for labwork. This should be back by 1pm. IF the chest pain continues, numbness continues, you have signs of bleeding, you must go immediately to the ER.  If your hemoglobin continues to be low, you must go to the ER.   I have made you an appt at 11am with Denice Paradise, Dr. Edd Fabian PA to follow-up on your GI bleed. This is at [facility] next to the hospital. Please be there 15 minutes early.  Hold warfarin and take only aspirin until we are sure you are not bleeding in the stomach.  Take prednisone 40mg  once daily for 5 days to help with your breathing.

## 2015-11-24 NOTE — Telephone Encounter (Signed)
Called Dr. Juanell Fairly office to see if patient was still being seen there as she has been discharged from this office and needs follow-up. Per the front desk pt has not been seen in over 1 year and is no longer a patient. Per [place] CSRS she had a prescription for Ativan 0.5mg  on 08/25/2015 with 3 refills. The front desk requested I speak with Dr. Hall Busing. Per him he does not recall or have any documentation of this prescription. He will call [facility] to investigate.

## 2015-12-15 ENCOUNTER — Other Ambulatory Visit: Payer: Medicare HMO

## 2015-12-27 ENCOUNTER — Emergency Department: Payer: Medicare HMO

## 2015-12-27 ENCOUNTER — Emergency Department
Admission: EM | Admit: 2015-12-27 | Discharge: 2015-12-27 | Disposition: A | Discharge status: Other Acute Inpatient Hospital | Payer: Medicare HMO | Attending: Student in an Organized Health Care Education/Training Program | Admitting: Student in an Organized Health Care Education/Training Program

## 2015-12-27 ENCOUNTER — Encounter (HOSPITAL_COMMUNITY): Payer: Self-pay | Admitting: Internal Medicine

## 2015-12-27 ENCOUNTER — Inpatient Hospital Stay (HOSPITAL_COMMUNITY)
Admission: EM | Admit: 2015-12-27 | Discharge: 2015-12-31 | DRG: 378 | Disposition: A | Discharge status: Home Health | Payer: Medicare HMO | Source: Other Acute Inpatient Hospital | Attending: Internal Medicine | Admitting: Internal Medicine

## 2015-12-27 ENCOUNTER — Encounter: Payer: Self-pay | Admitting: Emergency Medicine

## 2015-12-27 DIAGNOSIS — Z79899 Other long term (current) drug therapy: Secondary | ICD-10-CM | POA: Diagnosis not present

## 2015-12-27 DIAGNOSIS — Z7982 Long term (current) use of aspirin: Secondary | ICD-10-CM

## 2015-12-27 DIAGNOSIS — Z720 Tobacco use: Secondary | ICD-10-CM | POA: Diagnosis present

## 2015-12-27 DIAGNOSIS — D649 Anemia, unspecified: Secondary | ICD-10-CM | POA: Diagnosis present

## 2015-12-27 DIAGNOSIS — Z7951 Long term (current) use of inhaled steroids: Secondary | ICD-10-CM | POA: Diagnosis not present

## 2015-12-27 DIAGNOSIS — I2510 Atherosclerotic heart disease of native coronary artery without angina pectoris: Secondary | ICD-10-CM | POA: Insufficient documentation

## 2015-12-27 DIAGNOSIS — G9389 Other specified disorders of brain: Secondary | ICD-10-CM | POA: Diagnosis not present

## 2015-12-27 DIAGNOSIS — K922 Gastrointestinal hemorrhage, unspecified: Secondary | ICD-10-CM | POA: Insufficient documentation

## 2015-12-27 DIAGNOSIS — F17210 Nicotine dependence, cigarettes, uncomplicated: Secondary | ICD-10-CM | POA: Diagnosis present

## 2015-12-27 DIAGNOSIS — K222 Esophageal obstruction: Secondary | ICD-10-CM | POA: Diagnosis present

## 2015-12-27 DIAGNOSIS — K219 Gastro-esophageal reflux disease without esophagitis: Secondary | ICD-10-CM | POA: Diagnosis present

## 2015-12-27 DIAGNOSIS — R7989 Other specified abnormal findings of blood chemistry: Secondary | ICD-10-CM | POA: Diagnosis not present

## 2015-12-27 DIAGNOSIS — K921 Melena: Secondary | ICD-10-CM

## 2015-12-27 DIAGNOSIS — Z7901 Long term (current) use of anticoagulants: Secondary | ICD-10-CM

## 2015-12-27 DIAGNOSIS — D500 Iron deficiency anemia secondary to blood loss (chronic): Secondary | ICD-10-CM | POA: Diagnosis present

## 2015-12-27 DIAGNOSIS — J449 Chronic obstructive pulmonary disease, unspecified: Secondary | ICD-10-CM | POA: Diagnosis present

## 2015-12-27 DIAGNOSIS — N390 Urinary tract infection, site not specified: Secondary | ICD-10-CM | POA: Diagnosis present

## 2015-12-27 DIAGNOSIS — I10 Essential (primary) hypertension: Secondary | ICD-10-CM | POA: Insufficient documentation

## 2015-12-27 DIAGNOSIS — D509 Iron deficiency anemia, unspecified: Secondary | ICD-10-CM

## 2015-12-27 DIAGNOSIS — Z681 Body mass index (BMI) 19 or less, adult: Secondary | ICD-10-CM

## 2015-12-27 DIAGNOSIS — E785 Hyperlipidemia, unspecified: Secondary | ICD-10-CM | POA: Diagnosis present

## 2015-12-27 DIAGNOSIS — K449 Diaphragmatic hernia without obstruction or gangrene: Secondary | ICD-10-CM | POA: Diagnosis present

## 2015-12-27 DIAGNOSIS — F17200 Nicotine dependence, unspecified, uncomplicated: Secondary | ICD-10-CM | POA: Insufficient documentation

## 2015-12-27 DIAGNOSIS — I69354 Hemiplegia and hemiparesis following cerebral infarction affecting left non-dominant side: Secondary | ICD-10-CM | POA: Diagnosis not present

## 2015-12-27 DIAGNOSIS — R531 Weakness: Secondary | ICD-10-CM | POA: Diagnosis present

## 2015-12-27 DIAGNOSIS — F419 Anxiety disorder, unspecified: Secondary | ICD-10-CM | POA: Diagnosis present

## 2015-12-27 DIAGNOSIS — D62 Acute posthemorrhagic anemia: Secondary | ICD-10-CM

## 2015-12-27 DIAGNOSIS — K254 Chronic or unspecified gastric ulcer with hemorrhage: Principal | ICD-10-CM | POA: Diagnosis present

## 2015-12-27 DIAGNOSIS — R569 Unspecified convulsions: Secondary | ICD-10-CM

## 2015-12-27 DIAGNOSIS — K259 Gastric ulcer, unspecified as acute or chronic, without hemorrhage or perforation: Secondary | ICD-10-CM | POA: Diagnosis not present

## 2015-12-27 DIAGNOSIS — L8990 Pressure ulcer of unspecified site, unspecified stage: Secondary | ICD-10-CM | POA: Diagnosis present

## 2015-12-27 DIAGNOSIS — L89151 Pressure ulcer of sacral region, stage 1: Secondary | ICD-10-CM | POA: Diagnosis present

## 2015-12-27 DIAGNOSIS — F32A Depression, unspecified: Secondary | ICD-10-CM | POA: Diagnosis present

## 2015-12-27 DIAGNOSIS — F418 Other specified anxiety disorders: Secondary | ICD-10-CM | POA: Diagnosis not present

## 2015-12-27 DIAGNOSIS — R778 Other specified abnormalities of plasma proteins: Secondary | ICD-10-CM

## 2015-12-27 DIAGNOSIS — J432 Centrilobular emphysema: Secondary | ICD-10-CM | POA: Diagnosis not present

## 2015-12-27 DIAGNOSIS — F329 Major depressive disorder, single episode, unspecified: Secondary | ICD-10-CM | POA: Diagnosis present

## 2015-12-27 DIAGNOSIS — R64 Cachexia: Secondary | ICD-10-CM | POA: Diagnosis present

## 2015-12-27 DIAGNOSIS — H5347 Heteronymous bilateral field defects: Secondary | ICD-10-CM | POA: Diagnosis present

## 2015-12-27 LAB — URINALYSIS COMPLETE WITH MICROSCOPIC (ARMC ONLY)
Bilirubin Urine: NEGATIVE
Glucose, UA: NEGATIVE mg/dL
Hgb urine dipstick: NEGATIVE
Ketones, ur: NEGATIVE mg/dL
Nitrite: POSITIVE — AB
PH: 6 (ref 5.0–8.0)
PROTEIN: NEGATIVE mg/dL
Specific Gravity, Urine: 1.015 (ref 1.005–1.030)

## 2015-12-27 LAB — MAGNESIUM
MAGNESIUM: 2 mg/dL (ref 1.7–2.4)
MAGNESIUM: 2 mg/dL (ref 1.7–2.4)

## 2015-12-27 LAB — CBC WITH DIFFERENTIAL/PLATELET
BASOS ABS: 0 10*3/uL (ref 0–0.1)
BASOS PCT: 1 %
Basophils Absolute: 0 10*3/uL (ref 0.0–0.1)
Basophils Relative: 0 %
EOS PCT: 1 %
Eosinophils Absolute: 0 10*3/uL (ref 0–0.7)
Eosinophils Absolute: 0.1 10*3/uL (ref 0.0–0.7)
Eosinophils Relative: 1 %
HEMATOCRIT: 17.3 % — AB (ref 35.0–47.0)
HEMATOCRIT: 20.8 % — AB (ref 36.0–46.0)
Hemoglobin: 5.2 g/dL — ABNORMAL LOW (ref 12.0–16.0)
Hemoglobin: 6.1 g/dL — CL (ref 12.0–15.0)
LYMPHS PCT: 17 %
Lymphocytes Relative: 10 %
Lymphs Abs: 0.7 10*3/uL — ABNORMAL LOW (ref 1.0–3.6)
Lymphs Abs: 1 10*3/uL (ref 0.7–4.0)
MCH: 21.7 pg — ABNORMAL LOW (ref 26.0–34.0)
MCH: 22.7 pg — AB (ref 26.0–34.0)
MCHC: 30 g/dL — ABNORMAL LOW (ref 32.0–36.0)
MCHC: 29.3 g/dL — AB (ref 30.0–36.0)
MCV: 72.1 fL — ABNORMAL LOW (ref 80.0–100.0)
MCV: 77.3 fL — AB (ref 78.0–100.0)
MONO ABS: 0.6 10*3/uL (ref 0.2–0.9)
MONO ABS: 0.7 10*3/uL (ref 0.1–1.0)
MONOS PCT: 11 %
Monocytes Relative: 9 %
NEUTROS ABS: 5.6 10*3/uL (ref 1.4–6.5)
NEUTROS ABS: 4.2 10*3/uL (ref 1.7–7.7)
Neutrophils Relative %: 79 %
Neutrophils Relative %: 70 %
PLATELETS: 407 10*3/uL (ref 150–440)
Platelets: 374 10*3/uL (ref 150–400)
RBC: 2.4 MIL/uL — ABNORMAL LOW (ref 3.80–5.20)
RBC: 2.69 MIL/uL — ABNORMAL LOW (ref 3.87–5.11)
RDW: 20.2 % — AB (ref 11.5–14.5)
RDW: 17.4 % — AB (ref 11.5–15.5)
WBC: 6.9 10*3/uL (ref 3.6–11.0)
WBC: 6 10*3/uL (ref 4.0–10.5)

## 2015-12-27 LAB — COMPREHENSIVE METABOLIC PANEL
ALBUMIN: 3.5 g/dL (ref 3.5–5.0)
ALT: 22 U/L (ref 14–54)
ALT: 19 U/L (ref 14–54)
ANION GAP: 6 (ref 5–15)
AST: 27 U/L (ref 15–41)
AST: 25 U/L (ref 15–41)
Albumin: 3 g/dL — ABNORMAL LOW (ref 3.5–5.0)
Alkaline Phosphatase: 58 U/L (ref 38–126)
Alkaline Phosphatase: 51 U/L (ref 38–126)
Anion gap: 7 (ref 5–15)
BILIRUBIN TOTAL: 0.7 mg/dL (ref 0.3–1.2)
BUN: 14 mg/dL (ref 6–20)
BUN: 11 mg/dL (ref 6–20)
CHLORIDE: 104 mmol/L (ref 101–111)
CO2: 26 mmol/L (ref 22–32)
CO2: 22 mmol/L (ref 22–32)
Calcium: 9 mg/dL (ref 8.9–10.3)
Calcium: 8.3 mg/dL — ABNORMAL LOW (ref 8.9–10.3)
Chloride: 103 mmol/L (ref 101–111)
Creatinine, Ser: 0.67 mg/dL (ref 0.44–1.00)
Creatinine, Ser: 0.67 mg/dL (ref 0.44–1.00)
GFR calc Af Amer: >60 mL/min (ref >60)
GFR calc Af Amer: >60 mL/min (ref >60)
GFR calc non Af Amer: >60 mL/min (ref >60)
GLUCOSE: 102 mg/dL — AB (ref 65–99)
Glucose, Bld: 87 mg/dL (ref 65–99)
POTASSIUM: 4.3 mmol/L (ref 3.5–5.1)
POTASSIUM: 3.8 mmol/L (ref 3.5–5.1)
SODIUM: 137 mmol/L (ref 135–145)
Sodium: 131 mmol/L — ABNORMAL LOW (ref 135–145)
TOTAL PROTEIN: 5.6 g/dL — AB (ref 6.5–8.1)
Total Bilirubin: 0.1 mg/dL — ABNORMAL LOW (ref 0.3–1.2)
Total Protein: 6.8 g/dL (ref 6.5–8.1)

## 2015-12-27 LAB — PROTIME-INR
INR: 2.83
Prothrombin Time: 30.3 seconds — ABNORMAL HIGH (ref 11.4–15.2)

## 2015-12-27 LAB — MRSA PCR SCREENING: MRSA BY PCR: NEGATIVE

## 2015-12-27 LAB — PREPARE RBC (CROSSMATCH)

## 2015-12-27 LAB — ABO/RH: ABO/RH(D): O NEG

## 2015-12-27 LAB — TROPONIN I: Troponin I: 1.09 ng/mL (ref <0.03)

## 2015-12-27 MED ORDER — SODIUM CHLORIDE 0.9 % IV SOLN
INTRAVENOUS | Status: DC
  Administered 2015-12-27: 20:00:00 via INTRAVENOUS

## 2015-12-27 MED ORDER — LORAZEPAM 2 MG/ML IJ SOLN
1.0000 mg | Freq: Once | INTRAMUSCULAR | Status: AC
  Administered 2015-12-27: 1 mg via INTRAVENOUS
  Filled 2015-12-27: qty 1

## 2015-12-27 MED ORDER — SODIUM CHLORIDE 0.9 % IV SOLN
Freq: Once | INTRAVENOUS | Status: AC
  Administered 2015-12-27: 21:00:00 via INTRAVENOUS

## 2015-12-27 MED ORDER — SODIUM CHLORIDE 0.9% FLUSH
3.0000 mL | Freq: Two times a day (BID) | INTRAVENOUS | Status: DC
  Administered 2015-12-27 – 2015-12-31 (×7): 3 mL via INTRAVENOUS

## 2015-12-27 MED ORDER — SODIUM CHLORIDE 0.9 % IV SOLN
Freq: Once | INTRAVENOUS | Status: AC
  Administered 2015-12-27: 15:00:00 via INTRAVENOUS

## 2015-12-27 MED ORDER — METOPROLOL SUCCINATE ER 50 MG PO TB24
50.0000 mg | ORAL_TABLET | Freq: Every day | ORAL | Status: DC
  Administered 2015-12-28 – 2015-12-31 (×4): 50 mg via ORAL
  Filled 2015-12-27 (×4): qty 1

## 2015-12-27 MED ORDER — DEXTROSE 5 % IV SOLN
2.0000 g | Freq: Once | INTRAVENOUS | Status: AC
  Administered 2015-12-27: 2 g via INTRAVENOUS
  Filled 2015-12-27: qty 2

## 2015-12-27 MED ORDER — NICOTINE 14 MG/24HR TD PT24
14.0000 mg | MEDICATED_PATCH | Freq: Every day | TRANSDERMAL | Status: DC
  Administered 2015-12-27 – 2015-12-31 (×5): 14 mg via TRANSDERMAL
  Filled 2015-12-27 (×5): qty 1

## 2015-12-27 MED ORDER — FAMOTIDINE IN NACL 20-0.9 MG/50ML-% IV SOLN
20.0000 mg | Freq: Two times a day (BID) | INTRAVENOUS | Status: DC
  Administered 2015-12-27: 20 mg via INTRAVENOUS
  Filled 2015-12-27: qty 50

## 2015-12-27 MED ORDER — SERTRALINE HCL 100 MG PO TABS
100.0000 mg | ORAL_TABLET | Freq: Two times a day (BID) | ORAL | Status: DC
  Administered 2015-12-27 – 2015-12-31 (×8): 100 mg via ORAL
  Filled 2015-12-27 (×8): qty 1

## 2015-12-27 MED ORDER — SODIUM CHLORIDE 0.9 % IV SOLN
80.0000 mg | Freq: Once | INTRAVENOUS | Status: AC
  Administered 2015-12-27: 80 mg via INTRAVENOUS
  Filled 2015-12-27: qty 80

## 2015-12-27 MED ORDER — IPRATROPIUM-ALBUTEROL 0.5-2.5 (3) MG/3ML IN SOLN: 3.0000 mL | Freq: Four times a day (QID) | RESPIRATORY_TRACT | Status: DC | PRN

## 2015-12-27 NOTE — ED Triage Notes (Signed)
Pt presents to ED via [facility] with c/o weakness x 1-2 weeks (pt is unsure when the symptoms started but estimates 1-2 weeks ago). Pt states she is having increased difficulty walking, numbness and tingling to R side and her around her lips. Pt lives at home with her husband and has been admitted for similar problem in the past but states was never told what the problem was. Pt also c/o productive cough x 1 week with clear sputum and during the last 1-2 weeks has had a HA that has resolved. Pt states "I just can't get around with my walker anymore, I just can't do anything". Pt has hx of previous stroke that has L sided weakness as a residual.

## 2015-12-27 NOTE — Progress Notes (Signed)
Patient arrived with blood transfusing blood completed at 1814.  Updated blood slip from [facility] and placed in chart.

## 2015-12-27 NOTE — ED Notes (Signed)
Pt acceptance at [facility] bed 15

## 2015-12-27 NOTE — ED Notes (Signed)
Michele Matthews, Medic to bedside at this time.

## 2015-12-27 NOTE — H&P (Signed)
History and Physical    Michele Matthews [id number] DOB: [birth year] DOA: 12/27/2015  PCP: Michele Mouse, NP   Patient coming from: Home.  Chief Complaint: Anemia.  HPI: Michele Matthews is a 70 y.o. female with medical history significant of allergies, COPD/emphysema, CAD, severe coronary artery disease (on warfarin and aspirin), depression, stroke, GERD, hyperlipidemia, hypertension, seizures, on warfarin who was transferred from [facility] for GI evaluation after presenting there with a history of progressively worse weakness for the past 2 weeks, melena 4 during 2 different days, several days ago.   Per patient's husband, the patient has been feeling progressively weaker over the past week or 2. She states that she noticed that she was very weak after she came back from a wedding in [place] last week. She has been having increased difficulty with ambulation also complains of numbness in her lips, her fingers and left foot. Earlier this week, patient had melena twice a day for 2 days and increased dyspnea, nausea and positional dizziness, but denies abdominal pain, chest pain, palpitations, diaphoresis, PND, orthopnea or pitting edema of the lower extremities. She is an active smoker of three quarters of a pack of cigarettes a day.   ED Course: The patient received 1 unit of packed RBCs at [facility]. They do not have GI service at the moment and transferred the patient to this facility for GI consultation. Hemoglobin level was 5.2 g/dL.  Review of Systems: As per HPI otherwise 10 point review of systems negative.  Past Medical History:  Diagnosis Date  . Allergy   . COPD (chronic obstructive pulmonary disease) ([place])   . Coronary artery disease   . Depression   . Emphysema of lung ([place])   . GERD (gastroesophageal reflux disease)   . Hyperlipidemia   . Hypertension   . Seizures ([place])   . Stroke [facility])     Past Surgical History:  Procedure Laterality Date  .  ESOPHAGOGASTRODUODENOSCOPY N/A 08/10/2015   Procedure: ESOPHAGOGASTRODUODENOSCOPY (EGD);  Surgeon: Michele Silvas, MD;  Location: [facility] ENDOSCOPY;  Service: Endoscopy;  Laterality: N/A;  . ESOPHAGOGASTRODUODENOSCOPY (EGD) WITH PROPOFOL N/A 12/11/2014   Procedure: ESOPHAGOGASTRODUODENOSCOPY (EGD) WITH PROPOFOL;  Surgeon: Michele Lame, MD;  Location: [facility] ENDOSCOPY;  Service: Endoscopy;  Laterality: N/A;     reports that she has been smoking.  She has been smoking about 1.00 pack per day. She has never used smokeless tobacco. She reports that she does not drink alcohol or use drugs.  No Known Allergies  Family History  Problem Relation Age of Onset  . Alzheimer's disease Father   . Lung cancer Sister   . Breast cancer Sister   . Cancer Mother     Prior to Admission medications   Medication Sig Start Date End Date Taking? Authorizing Provider  acetaminophen (TYLENOL) 500 MG tablet Take 1,000 mg by mouth See admin instructions. Take 2 tablets (1000 mg) by mouth every morning, may also take 2 tablets in the afternoon as needed for pain   Yes Historical Provider, MD  albuterol (PROVENTIL HFA;VENTOLIN HFA) 108 (90 Base) MCG/ACT inhaler Inhale 2 puffs into the lungs every 6 (six) hours as needed for wheezing or shortness of breath. 06/19/15  Yes Amy Overton Mam, NP  aspirin EC 81 MG tablet Take 81 mg by mouth daily.   Yes Historical Provider, MD  atorvastatin (LIPITOR) 10 MG tablet Take 10 mg by mouth daily.   Yes Historical Provider, MD  diphenhydramine-acetaminophen (TYLENOL PM) 25-500 MG  TABS tablet Take 2 tablets by mouth at bedtime.   Yes Historical Provider, MD  ferrous sulfate 325 (65 FE) MG tablet Take 325 mg by mouth daily with breakfast.   Yes Historical Provider, MD  levETIRAcetam (KEPPRA) 1000 MG tablet Take 1,000 mg by mouth 2 (two) times daily. For seizures   Yes Historical Provider, MD  sertraline (ZOLOFT) 100 MG tablet Take 100 mg by mouth 2 (two) times daily.   Yes Historical  Provider, MD  Tetrahydrozoline HCl (VISINE OP) Place 1 drop into both eyes 2 (two) times daily as needed (dry eyes).   Yes Historical Provider, MD  tiotropium (SPIRIVA) 18 MCG inhalation capsule Place 1 capsule (18 mcg total) into inhaler and inhale daily. Patient taking differently: Place 18 mcg into inhaler and inhale daily as needed (shortness of breath).  08/07/15  Yes Amy Overton Mam, NP  warfarin (COUMADIN) 5 MG tablet Take 2.5-5 mg by mouth See admin instructions. Take 1 tablet (5 mg) by mouth with supper on Thursday and Sunday, take 1/2 tablet (2.5 mg) on  Monday, Tuesday, Wednesday, Friday, Saturday with supper   Yes Historical Provider, MD  atenolol (TENORMIN) 50 MG tablet Take 50 mg by mouth 2 (two) times daily.    Historical Provider, MD  Fluticasone-Salmeterol (ADVAIR) 250-50 MCG/DOSE AEPB Inhale 1 puff into the lungs 2 (two) times daily. Patient not taking: Reported on 12/27/2015 08/07/15   Amy Overton Mam, NP  nicotine (NICODERM CQ - DOSED IN MG/24 HOURS) 14 mg/24hr patch Place 1 patch (14 mg total) onto the skin daily. Patient not taking: Reported on 12/27/2015 08/11/15   Michele Mango, MD  pantoprazole (PROTONIX) 40 MG tablet Take 1 tablet (40 mg total) by mouth 2 (two) times daily. Patient not taking: Reported on 12/27/2015 08/11/15   Michele Mango, MD    Physical Exam: Vitals:   12/27/15 1731 12/27/15 1928  BP: 139/64 119/90  Pulse: 90 91  Resp: (!) 21 (!) 23  Temp: 98 F (36.7 C) 98.7 F (37.1 C)  TempSrc: Oral Oral  SpO2: 99% 98%  Weight: 51.1 kg (112 lb 10.5 oz)   Height: 5\' 6"  (1.676 m)       Constitutional: NAD, calm, comfortable Vitals:   12/27/15 1731 12/27/15 1928  BP: 139/64 119/90  Pulse: 90 91  Resp: (!) 21 (!) 23  Temp: 98 F (36.7 C) 98.7 F (37.1 C)  TempSrc: Oral Oral  SpO2: 99% 98%  Weight: 51.1 kg (112 lb 10.5 oz)   Height: 5\' 6"  (1.676 m)    Eyes: PERRL, lids and conjunctivae are pale ENMT: Mucous membranes are moist. Posterior pharynx clear of  any exudate or lesions. Dentition partially absent and in poor state of repair.  Neck: supple, no masses, no thyromegaly Respiratory: Bilateral wheezing. Normal respiratory effort. No accessory muscle use.  Cardiovascular: Regular rate and rhythm, no murmurs / rubs / gallops. No extremity edema. 2+ pedal pulses. Loud left carotid bruit.  Abdomen: Bowel sounds positive. soft, no tenderness, no masses palpated. No hepatosplenomegaly.   Musculoskeletal: no clubbing / cyanosis. Good ROM, no contractures. Normal muscle tone.  Skin: Positive for grade 1 pressure ulcer Neurologic: CN 2-12 grossly intact. Sensation intact, DTR normal. Strength 4/5 left-sided hemiparesis.  Psychiatric:  Alert and oriented x 3. Mildly depressed mood.     Labs on Admission: I have personally reviewed following labs and imaging studies  CBC:  Recent Labs Lab 12/27/15 1121 12/27/15 1930  WBC 6.9 6.0  NEUTROABS 5.6 4.2  HGB  5.2* 6.1*  HCT 17.3* 20.8*  MCV 72.1* 77.3*  PLT 407 [id number]   Basic Metabolic Panel:  Recent Labs Lab 12/27/15 1121  NA 137  K 4.3  CL 104  CO2 26  GLUCOSE 102*  BUN 14  CREATININE 0.67  CALCIUM 9.0  MG 2.0   GFR: Estimated Creatinine Clearance: 52 mL/min (by C-G formula based on SCr of 0.8 mg/dL). Liver Function Tests:  Recent Labs Lab 12/27/15 1121  AST 27  ALT 22  ALKPHOS 58  BILITOT 0.1*  PROT 6.8  ALBUMIN 3.5   Coagulation Profile:  Recent Labs Lab 12/27/15 1121  INR 2.83   Cardiac Enzymes:  Recent Labs Lab 12/27/15 1121  TROPONINI 1.09*    Urine analysis:    Component Value Date/Time   COLORURINE YELLOW (A) 12/27/2015 1121   APPEARANCEUR HAZY (A) 12/27/2015 1121   APPEARANCEUR Hazy 03/27/2014 1845   LABSPEC 1.015 12/27/2015 1121   LABSPEC 1.028 03/27/2014 1845   PHURINE 6.0 12/27/2015 1121   GLUCOSEU NEGATIVE 12/27/2015 1121   GLUCOSEU Negative 03/27/2014 1845   HGBUR NEGATIVE 12/27/2015 1121   BILIRUBINUR NEGATIVE 12/27/2015 1121    BILIRUBINUR Negative 03/27/2014 1845   KETONESUR NEGATIVE 12/27/2015 1121   PROTEINUR NEGATIVE 12/27/2015 1121   NITRITE POSITIVE (A) 12/27/2015 1121   LEUKOCYTESUR 2+ (A) 12/27/2015 1121   LEUKOCYTESUR 3+ 03/27/2014 1845    Radiological Exams on Admission: Dg Chest 2 View  Result Date: 12/27/2015 CLINICAL DATA:  Weakness.  Concern for pneumonia. EXAM: CHEST  2 VIEW COMPARISON:  11/24/2015 FINDINGS: Moderate hiatal hernia. Heart and mediastinal contours are within normal limits. No focal opacities or effusions. No acute bony abnormality. IMPRESSION: No active cardiopulmonary disease. Electronically Signed   By: Rolm Baptise M.D.   On: 12/27/2015 11:52   Ct Head Wo Contrast  Result Date: 12/27/2015 CLINICAL DATA:  Weakness for 2 weeks. EXAM: CT HEAD WITHOUT CONTRAST TECHNIQUE: Contiguous axial images were obtained from the base of the skull through the vertex without intravenous contrast. COMPARISON:  10/26/2015 FINDINGS: Brain: Chronic encephalomalacia involving the left occipital lobe is identified and appears unchanged from previous exam. Large area of encephalomalacia within the distribution of the right MCA is again noted and also appears unchanged from previous exam. Prominence of the sulci. No acute intracranial hemorrhage, acute cortical infarct or mass identified. Vascular: No hyperdense vessel or unexpected calcification. Skull: The osseous skull is intact. Sinuses/Orbits: The mastoid air cells and paranasal sinuses are clear. Other: The calvarium is intact. IMPRESSION: No acute intracranial abnormality. 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 Electronically Signed   By: Kerby Moors M.D.   On: 12/27/2015 14:50    EKG: Independently reviewed. Vent. rate 78 BPM PR interval * ms QRS duration 111 ms QT/QTc 364/415 ms P-R-T axes 57 22 54 Sinus rhythm Borderline ST depression, lateral leads Artifact in lead(s) I II III aVR aVL aVF  V2  Assessment/Plan Principal Problem:   Upper GI bleed Admit to stepdown/inpatient. Supplemental oxygen as needed. Continue IV fluids. Continue packed RBC transfusion. Monitor hematocrit and hemoglobin. GI will evaluate in the morning. Given the patient's coronary artery stenosis severity, history of CVAs, reverse warfarin only if the patient is actively bleeding.  Active Problems:   Symptomatic anemia Transfusing packed RBCs at this time. Monitor H&H.    Essential hypertension Per patient's husband, and she is currently not using atenolol due to prescription plan issue This is apparently currently in backorder with her medical insurance. She  is currently taking Toprol XL 50 mg a day as a substitute    Chronic obstructive pulmonary disease ([place]) Supplemental oxygen as needed. Bronchodilators as needed.    Hemianopia Diagnosed in August 2015. Per patient, and she is following with ophthalmology periodically for these and other eye issues.    Current tobacco use Nicotine replacement therapy ordered.    Long term current use of anticoagulant therapy Due to severe carotid artery disease and history of strokes. Hold warfarin for now and check daily PT/INR. On the reverse in case that the patient's bleed becomes active.    Seizure [facility]) Per patient's husband, and she only had one single episode of seizures. They stopped the [place] sometime ago since they have not follow with a neurologist and there has not been any other episodes of seizures. Per patient's husband, her primary care provider did not prescribe refills without neurology follow-up.    Anxiety and depression Continue Zoloft 100 mg by mouth twice a day.    Grade 1 Pressure ulcer  Continue local care.     DVT prophylaxis: On warfarin. Code Status: Full code. Family Communication: Her husband was by bedside. Disposition Plan: Admit for packed RBC transfusion and GI evaluation. Consults called:  Gastroenterology (Dr. Hilarie Fredrickson) Admission status: Inpatient/stepdown.   Reubin Milan MD [facility] Pager [phone].  If 7PM-7AM, please contact night-coverage [email or web address] Password [email or web address]  12/27/2015, 8:03 PM

## 2015-12-27 NOTE — ED Provider Notes (Signed)
[facility] Emergency Department Provider Note    First MD Initiated Contact with Patient 12/27/15 1113     (approximate)  I have reviewed the triage vital signs and the nursing notes.   HISTORY  Chief Complaint Weakness    HPI Michele Matthews is a 70 y.o. female chief complaint of one week of generalized weakness. Patient states that she was traveling to [place] last week and for a wedding came back was severely fatigued and had several episodes of chills. She also has a history of COPD and coronary artery disease. She states that she continues to smoke. Does not feel more short of breath than usual. States that she's also had multiple strokes in the past and has known carotid stenosis on Coumadin. States she's been checked multiple times and has had a refractory anemia that the doctors cannot find outwards coming from. This morning the patient felt so weak that she is unable to ambulate. Her husband put her in a chair and rolled her to the back porch and called EMS to pick her up. On arrival to the ER she has no additional complaints at this time.   Past Medical History:  Diagnosis Date  . Allergy   . COPD (chronic obstructive pulmonary disease) ([place])   . Coronary artery disease   . Depression   . Emphysema of lung ([place])   . GERD (gastroesophageal reflux disease)   . Hyperlipidemia   . Hypertension   . Seizures ([place])   . Stroke [facility])     Patient Active Problem List   Diagnosis Date Noted  . Anemia 10/26/2015  . Melena 08/09/2015  . Anxiety 06/19/2015  . Chronic obstructive pulmonary disease ([place]) 06/19/2015  . Arteriosclerosis of coronary artery 06/19/2015  . Acid reflux 06/19/2015  . HLD (hyperlipidemia) 06/19/2015  . BP (high blood pressure) 06/19/2015  . Long term current use of anticoagulant therapy 06/19/2015  . Seizure ([place]) 06/19/2015  . Anxiety and depression 06/19/2015  . Symptomatic anemia 12/10/2014  . Upper GI bleed   .  Cerebrovascular accident (CVA) ([place]) 12/10/2013  . 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 Left-sided weakness 12/10/2013  . Current tobacco use 12/10/2013    Past Surgical History:  Procedure Laterality Date  . ESOPHAGOGASTRODUODENOSCOPY N/A 08/10/2015   Procedure: ESOPHAGOGASTRODUODENOSCOPY (EGD);  Surgeon: Manya Silvas, MD;  Location: [facility] ENDOSCOPY;  Service: Endoscopy;  Laterality: N/A;  . ESOPHAGOGASTRODUODENOSCOPY (EGD) WITH PROPOFOL N/A 12/11/2014   Procedure: ESOPHAGOGASTRODUODENOSCOPY (EGD) WITH PROPOFOL;  Surgeon: Lucilla Lame, MD;  Location: [facility] ENDOSCOPY;  Service: Endoscopy;  Laterality: N/A;    Prior to Admission medications   Medication Sig Start Date End Date Taking? Authorizing Provider  Acetaminophen 500 MG coapsule  11/02/15   Historical Provider, MD  albuterol (PROVENTIL HFA;VENTOLIN HFA) 108 (90 Base) MCG/ACT inhaler Inhale 2 puffs into the lungs every 6 (six) hours as needed for wheezing or shortness of breath. 06/19/15   Amy Overton Mam, NP  aspirin EC 81 MG tablet Take 81 mg by mouth daily.    Historical Provider, MD  atenolol (TENORMIN) 50 MG tablet Take 50 mg by mouth 2 (two) times daily.    Historical Provider, MD  atorvastatin (LIPITOR) 10 MG tablet Take 10 mg by mouth daily.    Historical Provider, MD  cephALEXin (KEFLEX) 500 MG capsule Take 1 capsule (500 mg total) by mouth 2 (two) times daily. 11/24/15   Amy Overton Mam, NP  Fluticasone-Salmeterol (ADVAIR) 250-50 MCG/DOSE AEPB Inhale 1 puff into the lungs 2 (  two) times daily. 08/07/15   Amy Overton Mam, NP  gabapentin (NEURONTIN) 300 MG capsule Take 300 mg by mouth 2 (two) times daily.     Historical Provider, MD  levETIRAcetam (KEPPRA) 1000 MG tablet Take 1,000 mg by mouth 2 (two) times daily.    Historical Provider, MD  lisinopril (PRINIVIL,ZESTRIL) 20 MG tablet Take 20 mg by mouth daily as needed (for increased BP).     Historical Provider, MD  LORazepam (ATIVAN) 0.5 MG tablet Take 0.5 mg by mouth 3 (three) times daily.     Historical Provider, MD  nicotine (NICODERM CQ - DOSED IN MG/24 HOURS) 14 mg/24hr patch Place 1 patch (14 mg total) onto the skin daily. 08/11/15   Nicholes Mango, MD  pantoprazole (PROTONIX) 40 MG tablet Take 1 tablet (40 mg total) by mouth 2 (two) times daily. 08/11/15   Nicholes Mango, MD  predniSONE (DELTASONE) 20 MG tablet Take 2 tablets (40 mg total) by mouth daily with breakfast. 11/24/15   Amy Overton Mam, NP  sertraline (ZOLOFT) 100 MG tablet Take 100 mg by mouth 2 (two) times daily.    Historical Provider, MD  tiotropium (SPIRIVA) 18 MCG inhalation capsule Place 1 capsule (18 mcg total) into inhaler and inhale daily. 08/07/15   Amy Overton Mam, NP  warfarin (COUMADIN) 2.5 MG tablet Take 2.5 mg by mouth every evening. Pt only takes this dose on Monday, Tuesday, Wednesday, Friday, and Saturday.    Historical Provider, MD  warfarin (COUMADIN) 5 MG tablet Take 5 mg by mouth every evening. Pt only takes this dose on Thursday and Sunday.    Historical Provider, MD    Allergies Review of patient's allergies indicates no known allergies.  Family History  Problem Relation Age of Onset  . Alzheimer's disease Father   . Lung cancer Sister   . Breast cancer Sister   . Cancer Mother     Social History Social History  Substance Use Topics  . Smoking status: Current Every Day Smoker    Packs/day: 1.00  . Smokeless tobacco: Never Used  . Alcohol use No    Review of Systems Patient denies headaches, rhinorrhea, blurry vision, numbness, shortness of breath, chest pain, edema, cough, abdominal pain, nausea, vomiting, diarrhea, dysuria, fevers, rashes or hallucinations unless otherwise stated above in HPI. ____________________________________________   PHYSICAL EXAM:  VITAL SIGNS: Vitals:   12/27/15 1142  BP: 118/64  Pulse: 81  Resp: 16  Temp: 97.9 F (36.6 C)    Constitutional: Alert and oriented. Frail appearing,  cachectic appearing  Eyes: Conjunctivae are normal. PERRL. EOMI. Head:  Atraumatic. Nose: No congestion/rhinnorhea. Mouth/Throat: Mucous membranes are moist.  Oropharynx non-erythematous. Neck: No stridor. Painless ROM. No cervical spine tenderness to palpation Hematological/Lymphatic/Immunilogical: No cervical lymphadenopathy. Cardiovascular: Normal rate, regular rhythm. Grossly normal heart sounds.  Good peripheral circulation. Respiratory: Normal respiratory effort.  No retractions. Coarse bibasilar breath sounds Gastrointestinal: Soft and nontender. No distention. No abdominal bruits. No CVA tenderness. Genitourinary:  Musculoskeletal: No lower extremity tenderness nor edema.  No joint effusions. Neurologic:  Normal speech and language. No gross focal neurologic deficits are appreciated. No gait instability. Skin:  Skin is warm, dry and intact. No rash noted. Psychiatric: Mood and affect are normal. Speech and behavior are normal.  ____________________________________________   LABS (all labs ordered are listed, but only abnormal results are displayed)  No results found for this or any previous visit (from the past 24 hour(s)). ____________________________________________  EKG My review and personal interpretation at Time: 11:23  Indication: weakness  Rate: 75  Rhythm: nsr Axis: normal Other: non specific st changes, no acute elevations or depression ____________________________________________  RADIOLOGY  CT head with NAICA CXR my read shows no evidence of acute cardiopulmonary process.  ____________________________________________   PROCEDURES  Procedure(s) performed: none    Critical Care performed: yes CRITICAL CARE Performed by: Merlyn Lot   Total critical care time: 40 minutes  Critical care time was exclusive of separately billable procedures and treating other patients.  Critical care was necessary to treat or prevent imminent or life-threatening deterioration.  Critical care was time spent personally by me on the  following activities: development of treatment plan with patient and/or surrogate as well as nursing, discussions with consultants, evaluation of patient's response to treatment, examination of patient, obtaining history from patient or surrogate, ordering and performing treatments and interventions, ordering and review of laboratory studies, ordering and review of radiographic studies, pulse oximetry and re-evaluation of patient's condition.  ____________________________________________   INITIAL IMPRESSION / ASSESSMENT AND PLAN / ED COURSE  Pertinent labs & imaging results that were available during my care of the patient were reviewed by me and considered in my medical decision making (see chart for details).  DDX: Anemia, UTI, CVA, dehydration, ACS, COPD exacerbation, pneumonia  NYERA RESA is a 71 y.o. who presents to the ED with 1 week of generalized weakness and malaise. She arrives afebrile and hemodynamic stable. She has no focal neuro deficits to suggest a CVA. Patient does appear frail and weak.  The patient will be placed on continuous pulse oximetry and telemetry for monitoring.  Laboratory evaluation will be sent to evaluate for the above complaints.     Clinical Course  Comment By Time  Noted hemoglobin of 5.2. This likely explains the patient's generalized weakness. We will order a type and screen and transfuse 2 units. Merlyn Lot, MD 08/27 1228  Patient has guaiac positive. Noted elevated troponin. Likely secondary to demand ischemia in the setting of acute anemia. Merlyn Lot, MD 08/27 1308  Patient with a complex presentation given evidence of acute blood loss anemia on anticoagulation. However patient has severe bilateral carotid stenosis and an elevated troponin suggesting acute demand ischemia. Based on her INR of 2.8 and normal blood pressure do not feel that emergent reversal of her anticoagulation is in the best interest of the patient due to significant risk  of CVA or worsening of her myocardial ischemia.  Patient will require transfer to [facility] hospital for GI availability given her anticoagulated state. Have discussed with the patient and available family all diagnostics and treatments performed thus far and all questions were answered to the best of my ability. The patient demonstrates understanding and agreement with plan.  Merlyn Lot, MD 08/27 1341     ____________________________________________   FINAL CLINICAL IMPRESSION(S) / ED DIAGNOSES  Final diagnoses:  Acute blood loss anemia  Gastrointestinal hemorrhage with melena  Troponin level elevated  Weakness  UTI (lower urinary tract infection)      NEW MEDICATIONS STARTED DURING THIS VISIT:  New Prescriptions   No medications on file     Note:  This document was prepared using Dragon voice recognition software and may include unintentional dictation errors.    Merlyn Lot, MD 12/27/15 [phone]

## 2015-12-27 NOTE — ED Notes (Signed)
Pt taken for CT 

## 2015-12-27 NOTE — ED Notes (Signed)
Report given to [place], RN.  

## 2015-12-27 NOTE — ED Notes (Signed)
This RN attempted 2nd IV, unsuccessful, did however draw type and screen. Verified with Marylin Crosby, RN. Pt continues to repeat, "I am going to pass out". This RN instructed patient to take deep breaths, pt states understanding at this time. Will continue to monitor for further patient needs.

## 2015-12-28 DIAGNOSIS — K259 Gastric ulcer, unspecified as acute or chronic, without hemorrhage or perforation: Secondary | ICD-10-CM

## 2015-12-28 DIAGNOSIS — R569 Unspecified convulsions: Secondary | ICD-10-CM

## 2015-12-28 DIAGNOSIS — N3000 Acute cystitis without hematuria: Secondary | ICD-10-CM

## 2015-12-28 DIAGNOSIS — N390 Urinary tract infection, site not specified: Secondary | ICD-10-CM | POA: Diagnosis present

## 2015-12-28 DIAGNOSIS — D649 Anemia, unspecified: Secondary | ICD-10-CM

## 2015-12-28 DIAGNOSIS — K922 Gastrointestinal hemorrhage, unspecified: Secondary | ICD-10-CM

## 2015-12-28 LAB — CBC WITH DIFFERENTIAL/PLATELET
BASOS ABS: 0 10*3/uL (ref 0.0–0.1)
Basophils Relative: 0 %
Eosinophils Absolute: 0.1 10*3/uL (ref 0.0–0.7)
Eosinophils Relative: 1 %
HCT: 30.9 % — ABNORMAL LOW (ref 36.0–46.0)
HEMOGLOBIN: 9.6 g/dL — AB (ref 12.0–15.0)
LYMPHS ABS: 0.5 10*3/uL — AB (ref 0.7–4.0)
LYMPHS PCT: 8 %
MCH: 24.9 pg — AB (ref 26.0–34.0)
MCHC: 31.1 g/dL (ref 30.0–36.0)
MCV: 80.1 fL (ref 78.0–100.0)
Monocytes Absolute: 0.8 10*3/uL (ref 0.1–1.0)
Monocytes Relative: 11 %
NEUTROS ABS: 5.4 10*3/uL (ref 1.7–7.7)
NEUTROS PCT: 80 %
Platelets: 317 10*3/uL (ref 150–400)
RBC: 3.86 MIL/uL — AB (ref 3.87–5.11)
RDW: 15.9 % — ABNORMAL HIGH (ref 11.5–15.5)
WBC: 6.7 10*3/uL (ref 4.0–10.5)

## 2015-12-28 LAB — CBC
HEMATOCRIT: 31.1 % — AB (ref 36.0–46.0)
Hemoglobin: 9.6 g/dL — ABNORMAL LOW (ref 12.0–15.0)
MCH: 24.5 pg — ABNORMAL LOW (ref 26.0–34.0)
MCHC: 30.9 g/dL (ref 30.0–36.0)
MCV: 79.3 fL (ref 78.0–100.0)
PLATELETS: 319 10*3/uL (ref 150–400)
RBC: 3.92 MIL/uL (ref 3.87–5.11)
RDW: 16.3 % — AB (ref 11.5–15.5)
WBC: 7.1 10*3/uL (ref 4.0–10.5)

## 2015-12-28 LAB — COMPREHENSIVE METABOLIC PANEL
ALK PHOS: 54 U/L (ref 38–126)
ALT: 18 U/L (ref 14–54)
AST: 23 U/L (ref 15–41)
Albumin: 3 g/dL — ABNORMAL LOW (ref 3.5–5.0)
Anion gap: 7 (ref 5–15)
BUN: 9 mg/dL (ref 6–20)
CALCIUM: 8.6 mg/dL — AB (ref 8.9–10.3)
CO2: 23 mmol/L (ref 22–32)
CREATININE: 0.66 mg/dL (ref 0.44–1.00)
Chloride: 105 mmol/L (ref 101–111)
Glucose, Bld: 86 mg/dL (ref 65–99)
Potassium: 3.8 mmol/L (ref 3.5–5.1)
Sodium: 135 mmol/L (ref 135–145)
Total Bilirubin: 1.1 mg/dL (ref 0.3–1.2)
Total Protein: 5.8 g/dL — ABNORMAL LOW (ref 6.5–8.1)

## 2015-12-28 LAB — PROTIME-INR
INR: 2.13
PROTHROMBIN TIME: 24.2 s — AB (ref 11.4–15.2)

## 2015-12-28 MED ORDER — BOOST / RESOURCE BREEZE PO LIQD
1.0000 | Freq: Three times a day (TID) | ORAL | Status: DC
  Administered 2015-12-28 – 2015-12-31 (×9): 1 via ORAL

## 2015-12-28 MED ORDER — SODIUM CHLORIDE 0.9 % IV SOLN
80.0000 mg | Freq: Once | INTRAVENOUS | Status: DC
  Filled 2015-12-28: qty 80

## 2015-12-28 MED ORDER — GUAIFENESIN ER 600 MG PO TB12
1200.0000 mg | ORAL_TABLET | Freq: Two times a day (BID) | ORAL | Status: DC
  Administered 2015-12-28 – 2015-12-31 (×7): 1200 mg via ORAL
  Filled 2015-12-28 (×7): qty 2

## 2015-12-28 MED ORDER — PANTOPRAZOLE SODIUM 40 MG IV SOLR: 40.0000 mg | Freq: Two times a day (BID) | INTRAVENOUS | Status: DC

## 2015-12-28 MED ORDER — SODIUM CHLORIDE 0.9 % IV SOLN
8.0000 mg/h | INTRAVENOUS | Status: DC
  Filled 2015-12-28 (×2): qty 80

## 2015-12-28 MED ORDER — GUAIFENESIN ER 600 MG PO TB12
1200.0000 mg | ORAL_TABLET | Freq: Two times a day (BID) | ORAL | Status: DC
Start: 2015-12-28 — End: 2015-12-28

## 2015-12-28 MED ORDER — DEXTROSE 5 % IV SOLN
1.0000 g | INTRAVENOUS | Status: DC
  Administered 2015-12-28 – 2015-12-31 (×4): 1 g via INTRAVENOUS
  Filled 2015-12-28 (×4): qty 10

## 2015-12-28 MED ORDER — PANTOPRAZOLE SODIUM 40 MG PO TBEC
40.0000 mg | DELAYED_RELEASE_TABLET | Freq: Two times a day (BID) | ORAL | Status: DC
  Administered 2015-12-28 – 2015-12-31 (×7): 40 mg via ORAL
  Filled 2015-12-28 (×7): qty 1

## 2015-12-28 NOTE — Progress Notes (Signed)
Michele NOTE    Michele Matthews  [id number] DOB: 07-19-1945 DOA: 12/27/2015 PCP: No primary care provider on file.    Brief Narrative:  Michele Matthews is a 70 y.o. female with medical history significant of allergies, COPD/emphysema, CAD, severe coronary artery disease (on warfarin and aspirin), depression, stroke, GERD, hyperlipidemia, hypertension, seizures, on warfarin who was transferred from [facility] for GI evaluation after presenting there with a history of progressively worse weakness for the past 2 weeks, melena 4 during 2 different days, several days ago.   Per patient's husband, the patient has been feeling progressively weaker over the past week or 2. She states that she noticed that she was very weak after she came back from a wedding in [place] last week. She has been having increased difficulty with ambulation also complains of numbness in her lips, her fingers and left foot. Earlier this week, patient had melena twice a day for 2 days and increased dyspnea, nausea and positional dizziness, but denies abdominal pain, chest pain, palpitations, diaphoresis, PND, orthopnea or pitting edema of the lower extremities. She is an active smoker of three quarters of a pack of cigarettes a day.   ED Course: The patient received 1 unit of packed RBCs at [facility]. They do not have GI service at the moment and transferred the patient to this facility for GI consultation. Hemoglobin level was 5.2 g/dL.   Assessment & Plan:   Principal Problem:   Upper GI bleed Active Problems:   Symptomatic anemia   Chronic obstructive pulmonary disease (HCC)   Hemianopia   HLD (hyperlipidemia)   Current tobacco use   Long term current use of anticoagulant therapy   Seizure (HCC)   Anxiety and depression   Pressure ulcer   Essential hypertension   UTI (urinary tract infection)  #1 probable upper GI bleed Patient comes in with a symptomatic anemia hemoglobin noted to be 5.2  on admission and patient status post 3 units packed red blood cells hemoglobin currently at 9.6 from 5.2 on admission. Discontinue IV Pepcid. Place on a PPI. Patient noted to have a upper endoscopy April 2017 after 2 weeks of melena and hemoglobin of 5.4 and the sentinel daily Aleve and no PPI. Upper endoscopy showed gastritis and at the 6 nonbleeding gastric ulcers largest measuring 5 mm. Patient denies any current NSAID use or aspirin. Patient states ran on of Protonix and had no refills and was not told she needed to be on it continuously. Patient for probable upper endoscopy tomorrow. Continue clear liquids. GI consultation pending.  #2 symptomatic anemia Likely secondary to problem #1. Patient status post 3 units packed red blood cells hemoglobin currently at 9.6 from 5.2 on admission. Follow H&H. See problem #1.  #3 probable UTI Check a urine culture. Place on IV Rocephin.  #4 hypertension Continue metoprolol.  #5 depression/anxiety Stable. Continue Zoloft.  #6 COPD/ongoing tobacco abuse Tobacco cessation. Continue nicotine patch. Nebs as needed.  #7 history of seizures Stable. Not on any AED. Outpatient follow-up.  #8 prophylaxis PPI for GI prophylaxis. SCDs for DVT prophylaxis.  DVT: SCDs Disposition Plan: Home when medically stable.   Consultants:   Gastroenterology pending  Procedures:  Chest x-ray 12/27/2015  3 units packed red blood cells 8/27-28/2017  Antimicrobials:   IV Rocephin 12/28/2015   Subjective: Patient complaining of dysuria. Patient complains of some shortness of breath. No chest pain. Patient states has been out of Protonix for the past few months orally. Patient denies  any melanotic stools today. Patient states last melanotic stool seemed to be improving from 2-3 days prior to admission.  Objective: Vitals:   12/28/15 0045 12/28/15 0103 12/28/15 0307 12/28/15 0700  BP: (!) 147/60 136/73 (!) 145/63 (!) 157/70  Pulse:  89 87 90  Resp:  15 15  15   Temp: 98.3 F (36.8 C) 98.6 F (37 C) 98.4 F (36.9 C) 98.9 F (37.2 C)  TempSrc: Oral Oral Oral Oral  SpO2:  99% 98% 98%  Weight:      Height:        Intake/Output Summary (Last 24 hours) at 12/28/15 0943 Last data filed at 12/28/15 0307  Gross per 24 hour  Intake          1881.75 ml  Output                0 ml  Net          1881.75 ml   Filed Weights   12/27/15 1731  Weight: 51.1 kg (112 lb 10.5 oz)    Examination:  General exam: Appears calm and comfortable  Respiratory system: Clear to auscultation. Respiratory effort normal. Cardiovascular system: S1 & S2 heard, RRR. No JVD, murmurs, rubs, gallops or clicks. No pedal edema. Gastrointestinal system: Abdomen is nondistended, soft and nontender. No organomegaly or masses felt. Normal bowel sounds heard. Central nervous system: Alert and oriented. No focal neurological deficits. Extremities: Symmetric 5 x 5 power. Skin: No rashes, lesions or ulcers Psychiatry: Judgement and insight appear normal. Mood & affect appropriate.     Data Reviewed: I have personally reviewed following labs and imaging studies  CBC:  Recent Labs Lab 12/27/15 1121 12/27/15 1930 12/28/15 0632  WBC 6.9 6.0 6.7  NEUTROABS 5.6 4.2 5.4  HGB 5.2* 6.1* 9.6*  HCT 17.3* 20.8* 30.9*  MCV 72.1* 77.3* 80.1  PLT 407 374 [id number]   Basic Metabolic Panel:  Recent Labs Lab 12/27/15 1121 12/27/15 1930 12/28/15 0632  NA 137 131* 135  K 4.3 3.8 3.8  CL 104 103 105  CO2 26 22 23   GLUCOSE 102* 87 86  BUN 14 11 9   CREATININE 0.67 0.67 0.66  CALCIUM 9.0 8.3* 8.6*  MG 2.0 2.0  --    GFR: Estimated Creatinine Clearance: 52 mL/min (by C-G formula based on SCr of 0.8 mg/dL). Liver Function Tests:  Recent Labs Lab 12/27/15 1121 12/27/15 1930 12/28/15 0632  AST 27 25 23   ALT 22 19 18   ALKPHOS 58 51 54  BILITOT 0.1* 0.7 1.1  PROT 6.8 5.6* 5.8*  ALBUMIN 3.5 3.0* 3.0*   No results for input(s): LIPASE, AMYLASE in the last 168 hours. No  results for input(s): AMMONIA in the last 168 hours. Coagulation Profile:  Recent Labs Lab 12/27/15 1121 12/28/15 0632  INR 2.83 2.13   Cardiac Enzymes:  Recent Labs Lab 12/27/15 1121  TROPONINI 1.09*   BNP (last 3 results) No results for input(s): PROBNP in the last 8760 hours. HbA1C: No results for input(s): HGBA1C in the last 72 hours. CBG: No results for input(s): GLUCAP in the last 168 hours. Lipid Profile: No results for input(s): CHOL, HDL, LDLCALC, TRIG, CHOLHDL, LDLDIRECT in the last 72 hours. Thyroid Function Tests: No results for input(s): TSH, T4TOTAL, FREET4, T3FREE, THYROIDAB in the last 72 hours. Anemia Panel: No results for input(s): VITAMINB12, FOLATE, FERRITIN, TIBC, IRON, RETICCTPCT in the last 72 hours. Sepsis Labs: No results for input(s): PROCALCITON, LATICACIDVEN in the last 168 hours.  Recent Results (  from the past 240 hour(s))  MRSA PCR Screening     Status: None   Collection Time: 12/27/15  6:23 PM  Result Value Ref Range Status   MRSA by PCR NEGATIVE NEGATIVE Final    Comment:        The GeneXpert MRSA Assay (FDA approved for NASAL specimens only), is one component of a comprehensive MRSA colonization surveillance program. It is not intended to diagnose MRSA infection nor to guide or monitor treatment for MRSA infections.          Radiology Studies: Dg Chest 2 View  Result Date: 12/27/2015 CLINICAL DATA:  Weakness.  Concern for pneumonia. EXAM: CHEST  2 VIEW COMPARISON:  11/24/2015 FINDINGS: Moderate hiatal hernia. Heart and mediastinal contours are within normal limits. No focal opacities or effusions. No acute bony abnormality. IMPRESSION: No active cardiopulmonary disease. Electronically Signed   By: Rolm Baptise M.D.   On: 12/27/2015 11:52   Ct Head Wo Contrast  Result Date: 12/27/2015 CLINICAL DATA:  Weakness for 2 weeks. EXAM: CT HEAD WITHOUT CONTRAST TECHNIQUE: Contiguous axial images were obtained from the base of the  skull through the vertex without intravenous contrast. COMPARISON:  10/26/2015 FINDINGS: Brain: Chronic encephalomalacia involving the left occipital lobe is identified and appears unchanged from previous exam. Large area of encephalomalacia within the distribution of the right MCA is again noted and also appears unchanged from previous exam. Prominence of the sulci. No acute intracranial hemorrhage, acute cortical infarct or mass identified. Vascular: No hyperdense vessel or unexpected calcification. Skull: The osseous skull is intact. Sinuses/Orbits: The mastoid air cells and paranasal sinuses are clear. Other: The calvarium is intact. IMPRESSION: No acute intracranial abnormality. 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 Electronically Signed   By: Kerby Moors M.D.   On: 12/27/2015 14:50        Scheduled Meds: . cefTRIAXone (ROCEPHIN)  IV  1 g Intravenous Q24H  . metoprolol succinate  50 mg Oral Daily  . nicotine  14 mg Transdermal Daily  . pantoprazole (PROTONIX) IVPB  80 mg Intravenous Once  . pantoprazole  40 mg Oral BID  . sertraline  100 mg Oral BID  . sodium chloride flush  3 mL Intravenous Q12H   Continuous Infusions: . sodium chloride 75 mL/hr at 12/27/15 1933     LOS: 1 day    Time spent: 26 minutes    Keshawna Dix, MD [facility] Pager [phone]  If 7PM-7AM, please contact night-coverage [email or web address] Password [facility] 12/28/2015, 9:43 AM

## 2015-12-28 NOTE — Progress Notes (Addendum)
Initial Nutrition Assessment  DOCUMENTATION CODES:   Severe malnutrition in context of chronic illness  INTERVENTION:    Boost Breeze po TID, each supplement provides 250 kcal and 9 grams of protein  When diet is advanced can change to Ensure Enlive po BID, each supplement provides 350 kcal and 20 grams of protein.  NUTRITION DIAGNOSIS:   Malnutrition related to chronic illness as evidenced by severe depletion of muscle mass, percent weight loss.  GOAL:   Patient will meet greater than or equal to 90% of their needs  MONITOR:   PO intake, Supplement acceptance, Diet advancement, I & O's, Skin  REASON FOR ASSESSMENT:   Malnutrition Screening Tool    ASSESSMENT:   70 y.o.femalewith medical history significant for allergies, COPD/emphysema, CAD, depression, stroke, GERD, hyperlipidemia, hypertension, seizures who was transferred from [facility] for GI evaluation after presenting there with a history of progressively worse weakness for the past 2 weeks and melena.   Labs and medications reviewed.  Patient reports that she has been eating poorly for the past 3 weeks and has lost a lot of weight during that time frame. She drinks Ensure at home. 10% weight loss within the past month is significant. Nutrition-Focused physical exam completed. Findings are mild-moderate fat depletion, severe muscle depletion, and no edema.  Currently on clear liquids. Plans for EGD tomorrow.   Diet Order:  Diet NPO time specified Except for: Sips with Meds Diet clear liquid Room service appropriate? Yes; Fluid consistency: Thin  Skin:  Wound (see comment) (stage I to sacrum)  Last BM:  8/25  Height:   Ht Readings from Last 1 Encounters:  12/27/15 5\' 6"  (1.676 m)    Weight:   Wt Readings from Last 1 Encounters:  12/27/15 112 lb 10.5 oz (51.1 kg)    Ideal Body Weight:  59.1 kg  BMI:  Body mass index is 18.18 kg/m.  Estimated Nutritional Needs:   Kcal:  1500-1700  Protein:  70-80  gm  Fluid:  >/= 1.5 L  EDUCATION NEEDS:   No education needs identified at this time  Molli Barrows, [place], [place], [place] Pager [phone] After Hours Pager [phone]

## 2015-12-28 NOTE — Consult Note (Signed)
[facility] Consult: 8:10 AM 12/28/2015  LOS: 1 day    Referring Provider: Dr Grandville Silos.   Primary Care Physician:  Leata Mouse, NP Primary Gastroenterologist:  Dr. Burnard Leigh.      Reason for Consultation:  Recurrent GI bleed and symptomatic anemia   HPI: Michele Matthews is a 70 y.o. female.  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  Htn.  Seizures.  "Severe" CAD.  IDA associated chest pain and demand ischemia, troponins negative 08/2015. 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  Lumbar compressin fracture.  Depression, anxiety. Mictrocytic anemia.   08/2015 EGD, for 2 weeksmelena/microcytic anemia (hgb 5.4) in setting of daily Aleve and no PPI : gastritis.  At least 6 non-bleeding gastric ulcers largest 5 mm. Transfused PRBC x 1.   Discharged off Coumadin for 1 week, continued 81 ASA and BID Protonix (new).  Hgb 10.5 at discharge, 7.3 on 6/26, 9.3 on 7/25.   She ran out of PPI after 1 month, had no refills.  She was unable to follow up with Dr Tiffany Kocher due to lack of transport, but has upcoming appt at end of 01/2016. She denies use of oral iron, though it is on her med list.    12/2014 EGD for IDA.  Dr Allen Norris.  Clean based GU.  Gastritis. HH. 06/2013 EGD.  No report but pathology found: chronic gastritis tieh increased size of parietal cells and apical protrusions. , no H Pylori.   05/2011 biopsy path report: Hyperplastic sigmoid polyp.  Gastric antral path: no H pylori, no pathology.  [place] described as large on 06/2013 chest CT.    Pt transferred from [facility] due to lack of GI coverage.  Early last week had sweats, chills, peripheral numbness in lips, feet, fingers.  + increased DOE, positional dizziness, nausea.  She associates these  sxs with her anxiety and took her OTC anxiety med, but it was not helpful.  Early to mid week had 2 days where stools were formed and dark but not melenic.  Stools turned brown again.  She normally has daily BMs but last BM was 2 days ago.   Appetite is generally poor.  Weakness is worse, could hardly move in recent days.  DOE is not worse than normal.   Hgb 5.2. 9.6 after 3 PRBCs.  On Pepcid IV but IV Protonix ordered.  INR of 7/25 was 3.6, but 2.8 on 8/27.  Currently 2.1.   Urine also with TNTC WBCs.  + nitrites.     Past Medical History:  Diagnosis Date  . Allergy   . COPD (chronic obstructive pulmonary disease) ([place])   . Coronary artery disease   . Depression   . Emphysema of lung ([place])   . GERD (gastroesophageal reflux disease)   . Hyperlipidemia   . Hypertension   . Seizures ([place])   . Stroke [facility])     Past Surgical History:  Procedure Laterality Date  . ESOPHAGOGASTRODUODENOSCOPY N/A 08/10/2015   Procedure:  ESOPHAGOGASTRODUODENOSCOPY (EGD);  Surgeon: Manya Silvas, MD;  Location[facility] ENDOSCOPY;  Service: Endoscopy;  Laterality: N/A;  . ESOPHAGOGASTRODUODENOSCOPY (EGD) WITH PROPOFOL N/A 12/11/2014   Procedure: ESOPHAGOGASTRODUODENOSCOPY (EGD) WITH PROPOFOL;  Surgeon: Lucilla Lame, MD;  Location: [facility] ENDOSCOPY;  Service: Endoscopy;  Laterality: N/A;    Prior to Admission medications   Medication Sig Start Date End Date Taking? Authorizing Provider  acetaminophen (TYLENOL) 500 MG tablet Take 1,000 mg by mouth See admin instructions. Take 2 tablets (1000 mg) by mouth every morning, may also take 2 tablets in the afternoon as needed for pain   Yes Historical Provider, MD  albuterol (PROVENTIL HFA;VENTOLIN HFA) 108 (90 Base) MCG/ACT inhaler Inhale 2 puffs into the lungs every 6 (six) hours as needed for wheezing or shortness of breath. 06/19/15  Yes Amy Overton Mam, NP  aspirin EC 81 MG tablet Take 81 mg by mouth daily.   Yes Historical Provider, MD  atorvastatin (LIPITOR) 10 MG  tablet Take 10 mg by mouth daily.   Yes Historical Provider, MD  diphenhydramine-acetaminophen (TYLENOL PM) 25-500 MG TABS tablet Take 2 tablets by mouth at bedtime.   Yes Historical Provider, MD  ferrous sulfate 325 (65 FE) MG tablet Take 325 mg by mouth daily with breakfast.   Yes Historical Provider, MD  levETIRAcetam (KEPPRA) 1000 MG tablet Take 1,000 mg by mouth 2 (two) times daily. For seizures   Yes Historical Provider, MD  sertraline (ZOLOFT) 100 MG tablet Take 100 mg by mouth 2 (two) times daily.   Yes Historical Provider, MD  Tetrahydrozoline HCl (VISINE OP) Place 1 drop into both eyes 2 (two) times daily as needed (dry eyes).   Yes Historical Provider, MD  tiotropium (SPIRIVA) 18 MCG inhalation capsule Place 1 capsule (18 mcg total) into inhaler and inhale daily. Patient taking differently: Place 18 mcg into inhaler and inhale daily as needed (shortness of breath).  08/07/15  Yes Amy Overton Mam, NP  warfarin (COUMADIN) 5 MG tablet Take 2.5-5 mg by mouth See admin instructions. Take 1 tablet (5 mg) by mouth with supper on Thursday and Sunday, take 1/2 tablet (2.5 mg) on  Monday, Tuesday, Wednesday, Friday, Saturday with supper   Yes Historical Provider, MD  atenolol (TENORMIN) 50 MG tablet Take 50 mg by mouth 2 (two) times daily.    Historical Provider, MD  Fluticasone-Salmeterol (ADVAIR) 250-50 MCG/DOSE AEPB Inhale 1 puff into the lungs 2 (two) times daily. Patient not taking: Reported on 12/27/2015 08/07/15   Amy Overton Mam, NP  nicotine (NICODERM CQ - DOSED IN MG/24 HOURS) 14 mg/24hr patch Place 1 patch (14 mg total) onto the skin daily. Patient not taking: Reported on 12/27/2015 08/11/15   Nicholes Mango, MD  pantoprazole (PROTONIX) 40 MG tablet Take 1 tablet (40 mg total) by mouth 2 (two) times daily. Patient not taking: Reported on 12/27/2015 08/11/15   Nicholes Mango, MD    Scheduled Meds: . famotidine (PEPCID) IV  20 mg Intravenous Q12H  . metoprolol succinate  50 mg Oral Daily  .  nicotine  14 mg Transdermal Daily  . sertraline  100 mg Oral BID  . sodium chloride flush  3 mL Intravenous Q12H   Infusions: . sodium chloride 75 mL/hr at 12/27/15 1933   PRN Meds: ipratropium-albuterol   Allergies as of 12/27/2015  . (No Known Allergies)    Family History  Problem Relation Age of Onset  . Alzheimer's disease Father   . Lung cancer Sister   . Breast cancer Sister   .  Cancer Mother     Social History   Social History  . Marital status: Married    Spouse name: N/A  . Number of children: N/A  . Years of education: N/A   Occupational History  . Not on file.   Social History Main Topics  . Smoking status: Current Every Day Smoker    Packs/day: 1.00  . Smokeless tobacco: Never Used  . Alcohol use No  . Drug use: No  . Sexual activity: Not Currently   Other Topics Concern  . Not on file   Social History Narrative  . No narrative on file    REVIEW OF SYSTEMS: Constitutional:  Weakness, anorixia ENT:  No nose bleeds Pulm:  DOE CV:  No palpitations, no LE edema. No  Chest pain.  GU:  No hematuria, no frequency GI:  Per HPI Heme:  Per HPI   Transfusions:  Per HPI Neuro:  No headaches, no peripheral tingling or numbness Derm:  No itching, no rash or sores.  Endocrine:  No sweats or chills.  No polyuria or dysuria Immunization:  Not queried Travel:  None beyond local counties in last few months.    PHYSICAL EXAM: Vital signs in last 24 hours: Vitals:   12/28/15 0307 12/28/15 0700  BP: (!) 145/63 (!) 157/70  Pulse: 87 90  Resp: 15 15  Temp: 98.4 F (36.9 C)    Wt Readings from Last 3 Encounters:  12/27/15 51.1 kg (112 lb 10.5 oz)  12/27/15 47.6 kg (105 lb)  10/28/15 52.8 kg (116 lb 5 oz)    General: cachectic, frail, chronically ill looking.  worried Head:  No asymmetry or swelling.  No trauma  Eyes:  No icterus, no conj pallor Ears:  Large, erythematous, slightly HOH  Nose:  No discharge Mouth:  Moist and clear MM.  Some  missing teeth, some caries Neck:  No mass, no JVD Lungs:  Clear bil but diminished.  No dyspnea Heart: RRR.  No mrg.  S1, s2 present Abdomen:  Soft, thin.  NT.  ND.  No bruits or HSM.  No masses.   Rectal: deferred   Musc/Skeltl: arthritic changes in fingers/hands Extremities:  No CCE.  Feet warm  Neurologic:  Oriented x 3.  Left arm weakness, no tremor.  Skin:  No sores or rash Tattoos:  none   Psych:  Anxious.  Cooperative pleasant.   Intake/Output from previous day: 08/27 0701 - 08/28 0700 In: 1881.8 [I.V.:1161.8; Blood:670; IV Piggyback:50] Out: -  Intake/Output this shift: No intake/output data recorded.  LAB RESULTS:  Recent Labs  12/27/15 1121 12/27/15 1930 12/28/15 0632  WBC 6.9 6.0 6.7  HGB 5.2* 6.1* 9.6*  HCT 17.3* 20.8* 30.9*  PLT 407 374 317   BMET Lab Results  Component Value Date   NA 135 12/28/2015   NA 131 (L) 12/27/2015   NA 137 12/27/2015   K 3.8 12/28/2015   K 3.8 12/27/2015   K 4.3 12/27/2015   CL 105 12/28/2015   CL 103 12/27/2015   CL 104 12/27/2015   CO2 23 12/28/2015   CO2 22 12/27/2015   CO2 26 12/27/2015   GLUCOSE 86 12/28/2015   GLUCOSE 87 12/27/2015   GLUCOSE 102 (H) 12/27/2015   BUN 9 12/28/2015   BUN 11 12/27/2015   BUN 14 12/27/2015   CREATININE 0.66 12/28/2015   CREATININE 0.67 12/27/2015   CREATININE 0.67 12/27/2015   CALCIUM 8.6 (L) 12/28/2015   CALCIUM 8.3 (L) 12/27/2015   CALCIUM 9.0  12/27/2015   LFT  Recent Labs  12/27/15 1121 12/27/15 1930 12/28/15 0632  PROT 6.8 5.6* 5.8*  ALBUMIN 3.5 3.0* 3.0*  AST 27 25 23   ALT 22 19 18   ALKPHOS 58 51 54  BILITOT 0.1* 0.7 1.1   PT/INR Lab Results  Component Value Date   INR 2.13 12/28/2015   INR 2.83 12/27/2015   INR 3.6 (H) 11/24/2015   Hepatitis Panel No results for input(s): HEPBSAG, HCVAB, HEPAIGM, HEPBIGM in the last 72 hours. C-Diff No components found for: CDIFF Lipase     Component Value Date/Time   LIPASE 30 08/09/2015 0035    Drugs of Abuse    No results found for: LABOPIA, COCAINSCRNUR, LABBENZ, AMPHETMU, THCU, LABBARB   RADIOLOGY STUDIES: Dg Chest 2 View  Result Date: 12/27/2015 CLINICAL DATA:  Weakness.  Concern for pneumonia. EXAM: CHEST  2 VIEW COMPARISON:  11/24/2015 FINDINGS: Moderate hiatal hernia. Heart and mediastinal contours are within normal limits. No focal opacities or effusions. No acute bony abnormality. IMPRESSION: No active cardiopulmonary disease. Electronically Signed   By: Rolm Baptise M.D.   On: 12/27/2015 11:52   Ct Head Wo Contrast  Result Date: 12/27/2015 CLINICAL DATA:  Weakness for 2 weeks. EXAM: CT HEAD WITHOUT CONTRAST TECHNIQUE: Contiguous axial images were obtained from the base of the skull through the vertex without intravenous contrast. COMPARISON:  10/26/2015 FINDINGS: Brain: Chronic encephalomalacia involving the left occipital lobe is identified and appears unchanged from previous exam. Large area of encephalomalacia within the distribution of the right MCA is again noted and also appears unchanged from previous exam. Prominence of the sulci. No acute intracranial hemorrhage, acute cortical infarct or mass identified. Vascular: No hyperdense vessel or unexpected calcification. Skull: The osseous skull is intact. Sinuses/Orbits: The mastoid air cells and paranasal sinuses are clear. Other: The calvarium is intact. IMPRESSION: No acute intracranial abnormality. 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 Electronically Signed   By: Kerby Moors M.D.   On: 12/27/2015 14:50    ENDOSCOPIC STUDIES: Per HPI  IMPRESSION:   *  Symptomatic, microcytic Anemia.  Hgb improved post PRBC x 3.  Has not been taking po iron or received parenteral iron.   *  Gastritis, gastric ulcers.  Took PPI BID for ~  1 month after discharge 08/2015.  Has avoided NSAIDs.     *  Dark but not melenic stool 1 week ago.   08/2015 EGD:   *  CAD  *  Chronic Coumadin for hx CVA, severe  CAD  *  UTI.  Had UTI in 08/2015 and completed another round of abx for UTI 2 weeks ago.  Rocephin ordered.      PLAN:     *  egd tomorrow.  Clears today.  Switch to po BID Protonix.  Advised pt and spouse she should never go off PPI.     Azucena Freed  12/28/2015, 8:10 AM Pager: [phone]

## 2015-12-28 NOTE — Care Management Note (Signed)
Case Management Note  Patient Details  Name: ERIQA TEMBY MRN: [id number] Date of Birth: [birth year]  Subjective/Objective:    Patient lives with spouse, she has insurance for med coverage, she states she does not have a pcp, but her daughter n  Lanny Cramp,  Gracelyn Nurse who works as Therapist, sports at [employer] is looking into getting her a pcp.  Patient has a walker, which she uses.  She has a cane, a wheelchair which she does not use.  NCM will cont to follow for dc needs.                 Action/Plan:   Expected Discharge Date:                  Expected Discharge Plan:  [place]  In-House Referral:     Discharge planning Services  CM Consult  Post Acute Care Choice:    Choice offered to:     DME Arranged:    DME Agency:     HH Arranged:    [place] Agency:     Status of Service:  In process, will continue to follow  If discussed at Long Length of Stay Meetings, dates discussed:    Additional Comments:  Zenon Mayo, RN 12/28/2015, 5:42 PM

## 2015-12-29 ENCOUNTER — Inpatient Hospital Stay (HOSPITAL_COMMUNITY): Payer: Medicare HMO | Admitting: Anesthesiology

## 2015-12-29 ENCOUNTER — Encounter (HOSPITAL_COMMUNITY): Payer: Self-pay | Admitting: *Deleted

## 2015-12-29 ENCOUNTER — Encounter (HOSPITAL_COMMUNITY)
Admission: EM | Disposition: A | Discharge status: Home Health | Payer: Self-pay | Source: Other Acute Inpatient Hospital | Attending: Internal Medicine

## 2015-12-29 DIAGNOSIS — D509 Iron deficiency anemia, unspecified: Secondary | ICD-10-CM

## 2015-12-29 DIAGNOSIS — Z7901 Long term (current) use of anticoagulants: Secondary | ICD-10-CM

## 2015-12-29 HISTORY — PX: ESOPHAGOGASTRODUODENOSCOPY (EGD) WITH PROPOFOL: SHX5813

## 2015-12-29 LAB — BASIC METABOLIC PANEL
Anion gap: 7 (ref 5–15)
BUN: 6 mg/dL (ref 6–20)
CALCIUM: 8.5 mg/dL — AB (ref 8.9–10.3)
CO2: 24 mmol/L (ref 22–32)
CREATININE: 0.59 mg/dL (ref 0.44–1.00)
Chloride: 107 mmol/L (ref 101–111)
GFR calc Af Amer: >60 mL/min (ref >60)
GFR calc non Af Amer: >60 mL/min (ref >60)
Glucose, Bld: 89 mg/dL (ref 65–99)
POTASSIUM: 3.6 mmol/L (ref 3.5–5.1)
Sodium: 138 mmol/L (ref 135–145)

## 2015-12-29 LAB — CBC
HEMATOCRIT: 32.8 % — AB (ref 36.0–46.0)
Hemoglobin: 10 g/dL — ABNORMAL LOW (ref 12.0–15.0)
MCH: 24.6 pg — AB (ref 26.0–34.0)
MCHC: 30.5 g/dL (ref 30.0–36.0)
MCV: 80.8 fL (ref 78.0–100.0)
PLATELETS: 316 10*3/uL (ref 150–400)
RBC: 4.06 MIL/uL (ref 3.87–5.11)
RDW: 16.8 % — ABNORMAL HIGH (ref 11.5–15.5)
WBC: 6.5 10*3/uL (ref 4.0–10.5)

## 2015-12-29 LAB — PROTIME-INR
INR: 1.6
Prothrombin Time: 19.3 seconds — ABNORMAL HIGH (ref 11.4–15.2)

## 2015-12-29 SURGERY — ESOPHAGOGASTRODUODENOSCOPY (EGD) WITH PROPOFOL
Anesthesia: Monitor Anesthesia Care

## 2015-12-29 MED ORDER — PROPOFOL 10 MG/ML IV BOLUS
INTRAVENOUS | Status: DC | PRN
  Administered 2015-12-29 (×9): 30 mg via INTRAVENOUS

## 2015-12-29 MED ORDER — WARFARIN - PHARMACIST DOSING INPATIENT
Freq: Every day | Status: DC
Start: 2015-12-29 — End: 2015-12-31
  Administered 2015-12-29: 18:00:00

## 2015-12-29 MED ORDER — SODIUM CHLORIDE 0.9 % IV SOLN: INTRAVENOUS | Status: DC

## 2015-12-29 MED ORDER — LIDOCAINE HCL (CARDIAC) 20 MG/ML IV SOLN
INTRAVENOUS | Status: DC | PRN
  Administered 2015-12-29: 60 mg via INTRATRACHEAL

## 2015-12-29 MED ORDER — WARFARIN SODIUM 5 MG PO TABS
5.0000 mg | ORAL_TABLET | Freq: Once | ORAL | Status: AC
  Administered 2015-12-29: 5 mg via ORAL
  Filled 2015-12-29: qty 1

## 2015-12-29 NOTE — Interval H&P Note (Signed)
History and Physical Interval Note:  12/29/2015 8:30 AM No further episodes of melena, vitals and Hgb stable ICES KAMERER  has presented today for surgery, with the diagnosis of gi bleed, anemia.  hx gastric ulcers.  The various methods of treatment have been discussed with the patient and family. After consideration of risks, benefits and other options for treatment, the patient has consented to  Procedure(s): ESOPHAGOGASTRODUODENOSCOPY (EGD) WITH PROPOFOL (N/A) as a surgical intervention .  The patient's history has been reviewed, patient examined, no change in status, stable for surgery.  I have reviewed the patient's chart and labs.  Questions were answered to the patient's satisfaction.     Kavitha Nandigam

## 2015-12-29 NOTE — Op Note (Signed)
[facility] Patient Name: Michele Matthews Procedure Date : 12/29/2015 MRN: [id number] Attending MD: Mauri Pole , MD Date of Birth: [birth year] CSN: [id number] Age: 70 Admit Type: Inpatient Procedure:                Upper GI endoscopy Indications:              Suspected upper gastrointestinal bleeding Providers:                Mauri Pole, MD, Cleda Daub, RN,                            William Dalton, Technician Referring MD:              Medicines:                Monitored Anesthesia Care Complications:            No immediate complications. Estimated Blood Loss:     Estimated blood loss was minimal. Procedure:                Pre-Anesthesia Assessment:                           - Prior to the procedure, a History and Physical                            was performed, and patient medications and                            allergies were reviewed. The patient's tolerance of                            previous anesthesia was also reviewed. The risks                            and benefits of the procedure and the sedation                            options and risks were discussed with the patient.                            All questions were answered, and informed consent                            was obtained. Prior Anticoagulants: The patient                            last took Coumadin (warfarin) 4 days prior to the                            procedure. ASA Grade Assessment: III - A patient                            with severe systemic disease. After reviewing the  risks and benefits, the patient was deemed in                            satisfactory condition to undergo the procedure.                           After obtaining informed consent, the endoscope was                            passed under direct vision. Throughout the                            procedure, the patient's blood pressure, pulse, and              oxygen saturations were monitored continuously. The                            [id number]) scope was introduced through the                            mouth, and advanced to the second part of duodenum.                            The upper GI endoscopy was accomplished without                            difficulty. The patient tolerated the procedure                            well. Scope In: Scope Out: Findings:      One mild (non-circumferential scarring) benign-appearing, intrinsic       stenosis was found 33 to 34 cm from the incisors associated with mucosal       erosion, nodularity and irregularity of squamo columnar junction. This       measured 1.8 cm (inner diameter) x 1 cm (in length) and was traversed.       Biopsies were taken with a cold forceps for histology to rule out       barrett's esophagus/dysplasia.      A large ~7-8cm para esophageal hiatal hernia was present.      A few dispersed, less than 1 mm bleeding erosions were found in the       gastric antrum and body. There were stigmata of recent bleeding.       Biopsies were taken with a cold forceps for Helicobacter pylori testing.      The examined duodenum was normal. Impression:               - Benign-appearing esophageal stenosis with                            irregular EG junction. Biopsied.                           - Large hiatal hernia.                           -  Bleeding erosive gastropathy. Biopsied.                           - Normal examined duodenum. Moderate Sedation:      N/A Recommendation:           - Resume diet.                           - Continue present medications. PPI (Pantoprazole                            40mg ) 30 mins before breakfast daily                           - Resume Coumadin (warfarin) at prior dose today.                            Refer to primary physician for further adjustment                            of therapy.                           - No  ibuprofen, naproxen, or other non-steroidal                            anti-inflammatory drugs.                           - Await pathology results.                           - Repeat upper endoscopy PRN for surveillance based                            on pathology results.                           - Patient will need colonoscopy to determine the                            etiology of significant blood loss and anemia, can                            be scheduled as outpatient with her primary GI                            given doesnt have ongoing active bleed.                           -Ok to discharge home from GI perspective Procedure Code(s):        --- Professional ---                           [phone], Esophagogastroduodenoscopy, flexible,  transoral; with biopsy, single or multiple Diagnosis Code(s):        --- Professional ---                           K22.2, Esophageal obstruction                           K31.89, Other diseases of stomach and duodenum                           K92.2, Gastrointestinal hemorrhage, unspecified                           K44.9, Diaphragmatic hernia without obstruction or                            gangrene CPT copyright 2016 [employer]. All rights reserved. The codes documented in this report are preliminary and upon coder review may  be revised to meet current compliance requirements. Mauri Pole, MD 12/29/2015 9:11:46 AM This report has been signed electronically. Number of Addenda: 0

## 2015-12-29 NOTE — Anesthesia Postprocedure Evaluation (Signed)
Anesthesia Post Note  [name]  Procedure(s) Performed: Procedure(s) (LRB): ESOPHAGOGASTRODUODENOSCOPY (EGD) WITH PROPOFOL (N/A)  Patient location during evaluation: PACU Anesthesia Type: MAC Level of consciousness: awake and alert Pain management: pain level controlled Vital Signs Assessment: post-procedure vital signs reviewed and stable Respiratory status: spontaneous breathing, nonlabored ventilation, respiratory function stable and patient connected to nasal cannula oxygen Cardiovascular status: stable and blood pressure returned to baseline Anesthetic complications: no    Last Vitals:  Vitals:   12/29/15 0907 12/29/15 1058  BP: 133/70 (!) 150/69  Pulse: 88 98  Resp: 15 (!) 23  Temp:  36.4 C    Last Pain:  Vitals:   12/29/15 1058  TempSrc: Oral                 Effie Berkshire

## 2015-12-29 NOTE — Anesthesia Preprocedure Evaluation (Addendum)
Anesthesia Evaluation  Patient identified by MRN, date of birth, ID band Patient awake    Reviewed: Allergy & Precautions, NPO status , Patient's Chart, lab work & pertinent test results, reviewed documented beta blocker date and time   Airway Mallampati: II  TM Distance: >3 FB Neck ROM: Full    Dental  (+) Teeth Intact, Dental Advisory Given   Pulmonary COPD, Current Smoker,    breath sounds clear to auscultation       Cardiovascular hypertension, Pt. on medications and Pt. on home beta blockers + CAD   Rhythm:Regular Rate:Normal     Neuro/Psych Seizures -,  PSYCHIATRIC DISORDERS Anxiety Depression CVA    GI/Hepatic Neg liver ROS, GERD  Medicated,  Endo/Other  negative endocrine ROS  Renal/GU negative Renal ROS  negative genitourinary   Musculoskeletal negative musculoskeletal ROS (+)   Abdominal   Peds negative pediatric ROS (+)  Hematology negative hematology ROS (+)   Anesthesia Other Findings   Reproductive/Obstetrics negative OB ROS                            Lab Results  Component Value Date   WBC 6.5 12/29/2015   HGB 10.0 (L) 12/29/2015   HCT 32.8 (L) 12/29/2015   MCV 80.8 12/29/2015   PLT 316 12/29/2015   Lab Results  Component Value Date   CREATININE 0.59 12/29/2015   BUN 6 12/29/2015   NA 138 12/29/2015   K 3.6 12/29/2015   CL 107 12/29/2015   CO2 24 12/29/2015   Lab Results  Component Value Date   INR 1.60 12/29/2015   INR 2.13 12/28/2015   INR 2.83 12/27/2015     Anesthesia Physical Anesthesia Plan  ASA: III  Anesthesia Plan: MAC   Post-op Pain Management:    Induction: Intravenous  Airway Management Planned: Natural Airway  Additional Equipment:   Intra-op Plan:   Post-operative Plan: Extubation in OR  Informed Consent: I have reviewed the patients History and Physical, chart, labs and discussed the procedure including the risks, benefits and  alternatives for the proposed anesthesia with the patient or authorized representative who has indicated his/her understanding and acceptance.   Dental advisory given  Plan Discussed with: CRNA  Anesthesia Plan Comments:         Anesthesia Quick Evaluation

## 2015-12-29 NOTE — Transfer of Care (Signed)
Immediate Anesthesia Transfer of Care Note  [name]  Procedure(s) Performed: Procedure(s): ESOPHAGOGASTRODUODENOSCOPY (EGD) WITH PROPOFOL (N/A)  Patient Location: Endoscopy Unit  Anesthesia Type:MAC  Level of Consciousness: awake, alert  and oriented  Airway & Oxygen Therapy: Patient Spontanous Breathing  Post-op Assessment: Report given to RN, Post -op Vital signs reviewed and stable and Patient moving all extremities X 4  Post vital signs: Reviewed and stable  Last Vitals:  Vitals:   12/29/15 0700 12/29/15 0801  BP: (!) 154/70 (!) 162/64  Pulse: 95 94  Resp: 19 18  Temp: 36.6 C     Last Pain:  Vitals:   12/29/15 0801  TempSrc: Oral         Complications: No apparent anesthesia complications

## 2015-12-29 NOTE — Evaluation (Signed)
Physical Therapy Evaluation Patient Details Name: Michele Matthews MRN: [id number] DOB: 04-11-1946 Today's Date: 12/29/2015   History of Present Illness  Michele Matthews is a 70 y.o. female with medical history significant of allergies, COPD/emphysema, CAD, severe coronary artery disease (on warfarin and aspirin), depression, stroke, GERD, hyperlipidemia, hypertension, seizures, on warfarin who was transferred from [facility] for GI evaluation due to severe anemia (Hgb 5.2) and recent melena.    Clinical Impression  Patient presents with decreased independence with mobility due to deficits listed in PT problem list.  She will benefit from skilled PT in the acute setting to allow safe d/c home with family support and follow up HHPT.     Follow Up Recommendations Home health PT;Supervision/Assistance - 24 hour    Equipment Recommendations  None recommended by PT    Recommendations for Other Services       Precautions / Restrictions Precautions Precautions: Fall      Mobility  Bed Mobility Overal bed mobility: Needs Assistance Bed Mobility: Supine to Sit;Sit to Supine     Supine to sit: Supervision Sit to supine: Supervision   General bed mobility comments: assist for safety, lines  Transfers Overall transfer level: Needs assistance Equipment used: Rolling walker (2 wheeled) Transfers: Sit to/from Stand Sit to Stand: Max assist;Mod assist         General transfer comment: lifting assist and lowering assist due to decreased safety with rushing to sit due to anxiety  Ambulation/Gait Ambulation/Gait assistance: Mod assist Ambulation Distance (Feet): 12 Feet Assistive device: Rolling walker (2 wheeled) Gait Pattern/deviations: Step-to pattern;Wide base of support;Trunk flexed;Decreased dorsiflexion - left     General Gait Details: wore her shoes at her request, was important due to equinovarus tendency with risk for ankle injury if just in socks (RN aware); patient anxious and HR  up to 115 and irregular during mobility  Stairs            Wheelchair Mobility    Modified Rankin (Stroke Patients Only)       Balance Overall balance assessment: Needs assistance   Sitting balance-Leahy Scale: Good Sitting balance - Comments: able to don shoes at EOB and prior to returning to supine don socks at EOB   Standing balance support: Bilateral upper extremity supported Standing balance-Leahy Scale: Poor Standing balance comment: feaful and heavily reliant on UE support and assist for balance                             Pertinent Vitals/Pain Pain Assessment: No/denies pain    Home Living Family/patient expects to be discharged to:: Private residence Living Arrangements: Spouse/significant other;Children Available Help at Discharge: Family;Available 24 hours/day Type of Home: House Home Access: Stairs to enter Entrance Stairs-Rails: Right Entrance Stairs-Number of Steps: 3 Home Layout: One level Home Equipment: Walker - 2 wheels;Tub bench;Shower seat;Bedside commode;Hand held shower head      Prior Function Level of Independence: Needs assistance   Gait / Transfers Assistance Needed: normally able to walk on her own with walker, but last week needed help due to weakness (reports panic attacks, but likely was so anemic was pre-syncopal)  ADL's / Homemaking Assistance Needed: husband helps with showering, and IADL's        Hand Dominance   Dominant Hand: Right    Extremity/Trunk Assessment   Upper Extremity Assessment: LUE deficits/detail       LUE Deficits / Details: residual weakness from previous stroke, has  limited grasp   Lower Extremity Assessment: LLE deficits/detail   LLE Deficits / Details: Ankle AROM limited dorsiflexion and strength grossly 3+ throughout     Communication   Communication: HOH  Cognition Arousal/Alertness: Awake/alert Behavior During Therapy: WFL for tasks assessed/performed Overall Cognitive  Status: Within Functional Limits for tasks assessed                      General Comments General comments (skin integrity, edema, etc.): Spouse in room and reports pt will have 24 hour assist at home    Exercises        Assessment/Plan    PT Assessment Patient needs continued PT services  PT Diagnosis Generalized weakness;Abnormality of gait   PT Problem List Decreased mobility;Decreased strength;Decreased activity tolerance;Decreased balance;Decreased range of motion;Decreased safety awareness;Decreased knowledge of use of DME  PT Treatment Interventions DME instruction;Therapeutic activities;Gait training;Stair training;Functional mobility training;Balance training;Therapeutic exercise;Patient/family education   PT Goals (Current goals can be found in the Care Plan section) Acute Rehab PT Goals Patient Stated Goal: To return to independent PT Goal Formulation: With patient Time For Goal Achievement: 01/05/16 Potential to Achieve Goals: Good    Frequency Min 3X/week   Barriers to discharge        Co-evaluation               End of Session Equipment Utilized During Treatment: Gait belt Activity Tolerance: Patient limited by fatigue (and anxiety) Patient left: in bed;with call bell/phone within reach;with family/visitor present;with bed alarm set           Time: [id number] PT Time Calculation (min) (ACUTE ONLY): 23 min   Charges:   PT Evaluation $PT Eval Moderate Complexity: 1 Procedure PT Treatments $Gait Training: 8-22 mins   PT G CodesReginia Naas 01/04/2016, 5:05 PM  Magda Kiel, [place] 01/04/2016

## 2015-12-29 NOTE — Progress Notes (Signed)
PROGRESS NOTE    Michele Matthews  [id number] DOB: 05/24/1945 DOA: 12/27/2015 PCP: No primary care provider on file.    Brief Narrative:  Michele Matthews is a 70 y.o. female with medical history significant of allergies, COPD/emphysema, CAD, severe coronary artery disease (on warfarin and aspirin), depression, stroke, GERD, hyperlipidemia, hypertension, seizures, on warfarin who was transferred from [facility] for GI evaluation after presenting there with a history of progressively worse weakness for the past 2 weeks, melena 4 during 2 different days, several days ago.   Per patient's husband, the patient has been feeling progressively weaker over the past week or 2. She states that she noticed that she was very weak after she came back from a wedding in [place] last week. She has been having increased difficulty with ambulation also complains of numbness in her lips, her fingers and left foot. Earlier this week, patient had melena twice a day for 2 days and increased dyspnea, nausea and positional dizziness, but denies abdominal pain, chest pain, palpitations, diaphoresis, PND, orthopnea or pitting edema of the lower extremities. She is an active smoker of three quarters of a pack of cigarettes a day.   ED Course: The patient received 1 unit of packed RBCs at [facility]. They do not have GI service at the moment and transferred the patient to this facility for GI consultation. Hemoglobin level was 5.2 g/dL.  Patient admitted to stepdown unit and patient was transfused 3 units packed red blood cells hemoglobin currently at 10. Patient underwent upper endoscopy which showed a benign-appearing esophageal stenosis with irregular GE junction which was biopsied, large a shorter, bleeding erosive gastropathy.   Assessment & Plan:   Principal Problem:   Upper GI bleed Active Problems:   Symptomatic anemia   Chronic obstructive pulmonary disease (HCC)   Hemianopia   HLD  (hyperlipidemia)   Current tobacco use   Long term current use of anticoagulant therapy   Seizure (HCC)   Anxiety and depression   Pressure ulcer   Essential hypertension   UTI (urinary tract infection)   Anemia, iron deficiency  #1 probable upper GI bleed Patient comes in with a symptomatic anemia hemoglobin noted to be 5.2 on admission and patient status post 3 units packed red blood cells hemoglobin currently at 10 from 5.2 on admission. Patient noted to have a upper endoscopy April 2017 after 2 weeks of melena and hemoglobin of 5.4 and the sentinel daily Aleve and no PPI. Upper endoscopy 08/2015 showed gastritis and at the 6 nonbleeding gastric ulcers largest measuring 5 mm. Patient denies any current NSAID use or aspirin.  Patient status post upper endoscopy 12/29/2015 showing benign-appearing esophageal stenosis with irregular GE junction, biopsied. Large high initial hernia. Bleeding erosive gastropathy, biopsied.  Patient states ran out of Protonix and had no refills and was not told she needed to be on it continuously.  Continue PPI daily per GI recommendations. Per GI okay to resume Coumadin. No NSAIDs. Patient will need to follow-up with her gastroenterologist for upper endoscopy as needed for surveillance based on pathology results and also would likely benefit from a colonoscopy to determine etiology of significant blood loss and anemia as outpatient.  GI following and appreciate input and recommendations.  #2 symptomatic anemia Likely secondary to problem #1. Patient status post 3 units packed red blood cells hemoglobin currently at 10 from 5.2 on admission. Clinical improvement. Follow H&H.  #3 probable UTI Urine cultures pending. Continue IV Rocephin.   #4  hypertension Continue metoprolol.  #5 depression/anxiety Stable. Continue Zoloft.  #6 COPD/ongoing tobacco abuse Tobacco cessation. Continue nicotine patch. Nebs as needed.  #7 history of seizures Stable. Not on any  AED. Outpatient follow-up.  #8 long-term use of anticoagulation therapy secondary to severe carotid artery disease and history of strokes Per GI okay to resume Coumadin today at prior home dose. Will have pharmacy dose Coumadin.  #9 history of seizures Admitting physician patient only had a single episode of seizures. Keppra was stopped a while back as patient did not have follow-up with her neurologist and no other episodes of seizures. The patient's husband stated PCP did not prescribe refills without neurology follow-up. Outpatient follow-up.  #9 prophylaxis PPI for GI prophylaxis. SCDs for DVT prophylaxis.  DVT: SCDs Disposition Plan: Home when medically stable, if H&H remains stable hopefully tomorrow   Consultants:   Gastroenterology: Dr Silverio Decamp 12/28/2015  Procedures:  Chest x-ray 12/27/2015  3 units packed red blood cells 8/27-28/2017  Upper endoscopy 12/29/2015  Antimicrobials:   IV Rocephin 12/28/2015   Subjective: Patient just returned from upper endoscopy. Patient denies any shortness of breath. No chest pain. Patient states feeling better. Patient states weakness has improved.  Objective: Vitals:   12/29/15 0801 12/29/15 0857 12/29/15 0900 12/29/15 0907  BP: (!) 162/64 (!) 101/52 (!) 101/52 133/70  Pulse: 94 89 90 88  Resp: 18 17 16 15   Temp:  98.4 F (36.9 C)    TempSrc: Oral Oral    SpO2: 94% 97% 95% 97%  Weight:      Height:        Intake/Output Summary (Last 24 hours) at 12/29/15 0928 Last data filed at 12/29/15 0857  Gross per 24 hour  Intake             1870 ml  Output                0 ml  Net             1870 ml   Filed Weights   12/27/15 1731  Weight: 51.1 kg (112 lb 10.5 oz)    Examination:  General exam: Appears calm and comfortable  Respiratory system: Clear to auscultation. Respiratory effort normal. Cardiovascular system: S1 & S2 heard, RRR. No JVD, murmurs, rubs, gallops or clicks. No pedal edema. Gastrointestinal system:  Abdomen is nondistended, soft and nontender. No organomegaly or masses felt. Normal bowel sounds heard. Central nervous system: Alert and oriented. No focal neurological deficits. Extremities: Symmetric 5 x 5 power. Skin: No rashes, lesions or ulcers Psychiatry: Judgement and insight appear normal. Mood & affect appropriate.     Data Reviewed: I have personally reviewed following labs and imaging studies  CBC:  Recent Labs Lab 12/27/15 1121 12/27/15 1930 12/28/15 0632 12/28/15 1622 12/29/15 0424  WBC 6.9 6.0 6.7 7.1 6.5  NEUTROABS 5.6 4.2 5.4  --   --   HGB 5.2* 6.1* 9.6* 9.6* 10.0*  HCT 17.3* 20.8* 30.9* 31.1* 32.8*  MCV 72.1* 77.3* 80.1 79.3 80.8  PLT 407 374 317 319 [id number]   Basic Metabolic Panel:  Recent Labs Lab 12/27/15 1121 12/27/15 1930 12/28/15 0632 12/29/15 0424  NA 137 131* 135 138  K 4.3 3.8 3.8 3.6  CL 104 103 105 107  CO2 26 22 23 24   GLUCOSE 102* 87 86 89  BUN 14 11 9 6   CREATININE 0.67 0.67 0.66 0.59  CALCIUM 9.0 8.3* 8.6* 8.5*  MG 2.0 2.0  --   --  GFR: Estimated Creatinine Clearance: 52 mL/min (by C-G formula based on SCr of 0.8 mg/dL). Liver Function Tests:  Recent Labs Lab 12/27/15 1121 12/27/15 1930 12/28/15 0632  AST 27 25 23   ALT 22 19 18   ALKPHOS 58 51 54  BILITOT 0.1* 0.7 1.1  PROT 6.8 5.6* 5.8*  ALBUMIN 3.5 3.0* 3.0*   No results for input(s): LIPASE, AMYLASE in the last 168 hours. No results for input(s): AMMONIA in the last 168 hours. Coagulation Profile:  Recent Labs Lab 12/27/15 1121 12/28/15 0632 12/29/15 0424  INR 2.83 2.13 1.60   Cardiac Enzymes:  Recent Labs Lab 12/27/15 1121  TROPONINI 1.09*   BNP (last 3 results) No results for input(s): PROBNP in the last 8760 hours. HbA1C: No results for input(s): HGBA1C in the last 72 hours. CBG: No results for input(s): GLUCAP in the last 168 hours. Lipid Profile: No results for input(s): CHOL, HDL, LDLCALC, TRIG, CHOLHDL, LDLDIRECT in the last 72  hours. Thyroid Function Tests: No results for input(s): TSH, T4TOTAL, FREET4, T3FREE, THYROIDAB in the last 72 hours. Anemia Panel: No results for input(s): VITAMINB12, FOLATE, FERRITIN, TIBC, IRON, RETICCTPCT in the last 72 hours. Sepsis Labs: No results for input(s): PROCALCITON, LATICACIDVEN in the last 168 hours.  Recent Results (from the past 240 hour(s))  MRSA PCR Screening     Status: None   Collection Time: 12/27/15  6:23 PM  Result Value Ref Range Status   MRSA by PCR NEGATIVE NEGATIVE Final    Comment:        The GeneXpert MRSA Assay (FDA approved for NASAL specimens only), is one component of a comprehensive MRSA colonization surveillance program. It is not intended to diagnose MRSA infection nor to guide or monitor treatment for MRSA infections.          Radiology Studies: Dg Chest 2 View  Result Date: 12/27/2015 CLINICAL DATA:  Weakness.  Concern for pneumonia. EXAM: CHEST  2 VIEW COMPARISON:  11/24/2015 FINDINGS: Moderate hiatal hernia. Heart and mediastinal contours are within normal limits. No focal opacities or effusions. No acute bony abnormality. IMPRESSION: No active cardiopulmonary disease. Electronically Signed   By: Rolm Baptise M.D.   On: 12/27/2015 11:52   Ct Head Wo Contrast  Result Date: 12/27/2015 CLINICAL DATA:  Weakness for 2 weeks. EXAM: CT HEAD WITHOUT CONTRAST TECHNIQUE: Contiguous axial images were obtained from the base of the skull through the vertex without intravenous contrast. COMPARISON:  10/26/2015 FINDINGS: Brain: Chronic encephalomalacia involving the left occipital lobe is identified and appears unchanged from previous exam. Large area of encephalomalacia within the distribution of the right MCA is again noted and also appears unchanged from previous exam. Prominence of the sulci. No acute intracranial hemorrhage, acute cortical infarct or mass identified. Vascular: No hyperdense vessel or unexpected calcification. Skull: The osseous  skull is intact. Sinuses/Orbits: The mastoid air cells and paranasal sinuses are clear. Other: The calvarium is intact. IMPRESSION: No acute intracranial abnormality. 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 Electronically Signed   By: Kerby Moors M.D.   On: 12/27/2015 14:50        Scheduled Meds: . cefTRIAXone (ROCEPHIN)  IV  1 g Intravenous Q24H  . feeding supplement  1 Container Oral TID BM  . guaiFENesin  1,200 mg Oral BID  . metoprolol succinate  50 mg Oral Daily  . nicotine  14 mg Transdermal Daily  . pantoprazole (PROTONIX) IVPB  80 mg Intravenous Once  . pantoprazole  40  mg Oral BID  . sertraline  100 mg Oral BID  . sodium chloride flush  3 mL Intravenous Q12H   Continuous Infusions: . sodium chloride 75 mL/hr at 12/28/15 1900     LOS: 2 days    Time spent: 22 minutes    Jhett Fretwell, MD [facility] Pager [phone]  If 7PM-7AM, please contact night-coverage [email or web address] Password [facility] 12/29/2015, 9:28 AM

## 2015-12-29 NOTE — Progress Notes (Signed)
ANTICOAGULATION CONSULT NOTE - Initial Consult  Pharmacy Consult for warfarin Indication: Hx CVA/severe carotid stenosis  No Known Allergies  Patient Measurements: Height: 5\' 6"  (167.6 cm) Weight: 112 lb 10.5 oz (51.1 kg) IBW/kg (Calculated) : 59.3  Vital Signs: Temp: 98.4 F (36.9 C) (08/29 0857) Temp Source: Oral (08/29 0857) BP: 133/70 (08/29 0907) Pulse Rate: 88 (08/29 0907)  Labs:  Recent Labs  12/27/15 1121 12/27/15 1930 12/28/15 0632 12/28/15 1622 12/29/15 0424  HGB 5.2* 6.1* 9.6* 9.6* 10.0*  HCT 17.3* 20.8* 30.9* 31.1* 32.8*  PLT 407 374 317 319 316  LABPROT 30.3*  --  24.2*  --  19.3*  INR 2.83  --  2.13  --  1.60  CREATININE 0.67 0.67 0.66  --  0.59  TROPONINI 1.09*  --   --   --   --     Estimated Creatinine Clearance: 52 mL/min (by C-G formula based on SCr of 0.8 mg/dL).   Medical History: Past Medical History:  Diagnosis Date  . Allergy   . COPD (chronic obstructive pulmonary disease) ([place])   . Coronary artery disease   . Depression   . Emphysema of lung ([place])   . GERD (gastroesophageal reflux disease)   . Hyperlipidemia   . Hypertension   . Seizures ([place])   . Stroke [facility])     Medications:  Scheduled:  . cefTRIAXone (ROCEPHIN)  IV  1 g Intravenous Q24H  . feeding supplement  1 Container Oral TID BM  . guaiFENesin  1,200 mg Oral BID  . metoprolol succinate  50 mg Oral Daily  . nicotine  14 mg Transdermal Daily  . pantoprazole (PROTONIX) IVPB  80 mg Intravenous Once  . pantoprazole  40 mg Oral BID  . sertraline  100 mg Oral BID  . sodium chloride flush  3 mL Intravenous Q12H  . warfarin  5 mg Oral ONCE-1800  . Warfarin - Pharmacist Dosing Inpatient   Does not apply q1800    Assessment: 41 YOF admitted for GIB. Warfarin PTA for Hx of CVA/severe carotid stenosis - cleared to restart 8/29 by GI. Therapeutic INR on admission; held since 8/27. INR SUBtherapeutic today at 1.6. Hgb 10.0, plt WNL S/P 3 units PRBC.   PTA warfarin dose: 2.5mg   daily except 5mg  on Thursdays and Sundays   Goal of Therapy:  INR 2-3 Monitor platelets by anticoagulation protocol: Yes   Plan:  -Warfarin 5mg  x1 tonight -Daily INR -Monitor CBC, s/sx bleeding   Stephens November, PharmD Clinical Pharmacist 10:16 AM, 12/29/2015

## 2015-12-29 NOTE — H&P (View-Only) (Signed)
[facility] Consult: 8:10 AM 12/28/2015  LOS: 1 day    Referring Provider: Dr Grandville Silos.   Primary Care Physician:  Leata Mouse, NP Primary Gastroenterologist:  Dr. Burnard Leigh.      Reason for Consultation:  Recurrent GI bleed and symptomatic anemia   HPI: Michele Matthews is a 70 y.o. female.  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  Htn.  Seizures.  "Severe" CAD.  IDA associated chest pain and demand ischemia, troponins negative 08/2015. 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  Lumbar compressin fracture.  Depression, anxiety. Mictrocytic anemia.   08/2015 EGD, for 2 weeksmelena/microcytic anemia (hgb 5.4) in setting of daily Aleve and no PPI : gastritis.  At least 6 non-bleeding gastric ulcers largest 5 mm. Transfused PRBC x 1.   Discharged off Coumadin for 1 week, continued 81 ASA and BID Protonix (new).  Hgb 10.5 at discharge, 7.3 on 6/26, 9.3 on 7/25.   She ran out of PPI after 1 month, had no refills.  She was unable to follow up with Dr Tiffany Kocher due to lack of transport, but has upcoming appt at end of 01/2016. She denies use of oral iron, though it is on her med list.    12/2014 EGD for IDA.  Dr Allen Norris.  Clean based GU.  Gastritis. HH. 06/2013 EGD.  No report but pathology found: chronic gastritis tieh increased size of parietal cells and apical protrusions. , no H Pylori.   05/2011 biopsy path report: Hyperplastic sigmoid polyp.  Gastric antral path: no H pylori, no pathology.  [place] described as large on 06/2013 chest CT.    Pt transferred from [facility] due to lack of GI coverage.  Early last week had sweats, chills, peripheral numbness in lips, feet, fingers.  + increased DOE, positional dizziness, nausea.  She associates these  sxs with her anxiety and took her OTC anxiety med, but it was not helpful.  Early to mid week had 2 days where stools were formed and dark but not melenic.  Stools turned brown again.  She normally has daily BMs but last BM was 2 days ago.   Appetite is generally poor.  Weakness is worse, could hardly move in recent days.  DOE is not worse than normal.   Hgb 5.2. 9.6 after 3 PRBCs.  On Pepcid IV but IV Protonix ordered.  INR of 7/25 was 3.6, but 2.8 on 8/27.  Currently 2.1.   Urine also with TNTC WBCs.  + nitrites.     Past Medical History:  Diagnosis Date  . Allergy   . COPD (chronic obstructive pulmonary disease) ([place])   . Coronary artery disease   . Depression   . Emphysema of lung ([place])   . GERD (gastroesophageal reflux disease)   . Hyperlipidemia   . Hypertension   . Seizures ([place])   . Stroke [facility])     Past Surgical History:  Procedure Laterality Date  . ESOPHAGOGASTRODUODENOSCOPY N/A 08/10/2015   Procedure:  ESOPHAGOGASTRODUODENOSCOPY (EGD);  Surgeon: Manya Silvas, MD;  Location: [facility] ENDOSCOPY;  Service: Endoscopy;  Laterality: N/A;  . ESOPHAGOGASTRODUODENOSCOPY (EGD) WITH PROPOFOL N/A 12/11/2014   Procedure: ESOPHAGOGASTRODUODENOSCOPY (EGD) WITH PROPOFOL;  Surgeon: Lucilla Lame, MD;  Location: [facility] ENDOSCOPY;  Service: Endoscopy;  Laterality: N/A;    Prior to Admission medications   Medication Sig Start Date End Date Taking? Authorizing Provider  acetaminophen (TYLENOL) 500 MG tablet Take 1,000 mg by mouth See admin instructions. Take 2 tablets (1000 mg) by mouth every morning, may also take 2 tablets in the afternoon as needed for pain   Yes Historical Provider, MD  albuterol (PROVENTIL HFA;VENTOLIN HFA) 108 (90 Base) MCG/ACT inhaler Inhale 2 puffs into the lungs every 6 (six) hours as needed for wheezing or shortness of breath. 06/19/15  Yes Amy Overton Mam, NP  aspirin EC 81 MG tablet Take 81 mg by mouth daily.   Yes Historical Provider, MD  atorvastatin (LIPITOR) 10 MG  tablet Take 10 mg by mouth daily.   Yes Historical Provider, MD  diphenhydramine-acetaminophen (TYLENOL PM) 25-500 MG TABS tablet Take 2 tablets by mouth at bedtime.   Yes Historical Provider, MD  ferrous sulfate 325 (65 FE) MG tablet Take 325 mg by mouth daily with breakfast.   Yes Historical Provider, MD  levETIRAcetam (KEPPRA) 1000 MG tablet Take 1,000 mg by mouth 2 (two) times daily. For seizures   Yes Historical Provider, MD  sertraline (ZOLOFT) 100 MG tablet Take 100 mg by mouth 2 (two) times daily.   Yes Historical Provider, MD  Tetrahydrozoline HCl (VISINE OP) Place 1 drop into both eyes 2 (two) times daily as needed (dry eyes).   Yes Historical Provider, MD  tiotropium (SPIRIVA) 18 MCG inhalation capsule Place 1 capsule (18 mcg total) into inhaler and inhale daily. Patient taking differently: Place 18 mcg into inhaler and inhale daily as needed (shortness of breath).  08/07/15  Yes Amy Overton Mam, NP  warfarin (COUMADIN) 5 MG tablet Take 2.5-5 mg by mouth See admin instructions. Take 1 tablet (5 mg) by mouth with supper on Thursday and Sunday, take 1/2 tablet (2.5 mg) on  Monday, Tuesday, Wednesday, Friday, Saturday with supper   Yes Historical Provider, MD  atenolol (TENORMIN) 50 MG tablet Take 50 mg by mouth 2 (two) times daily.    Historical Provider, MD  Fluticasone-Salmeterol (ADVAIR) 250-50 MCG/DOSE AEPB Inhale 1 puff into the lungs 2 (two) times daily. Patient not taking: Reported on 12/27/2015 08/07/15   Amy Overton Mam, NP  nicotine (NICODERM CQ - DOSED IN MG/24 HOURS) 14 mg/24hr patch Place 1 patch (14 mg total) onto the skin daily. Patient not taking: Reported on 12/27/2015 08/11/15   Nicholes Mango, MD  pantoprazole (PROTONIX) 40 MG tablet Take 1 tablet (40 mg total) by mouth 2 (two) times daily. Patient not taking: Reported on 12/27/2015 08/11/15   Nicholes Mango, MD    Scheduled Meds: . famotidine (PEPCID) IV  20 mg Intravenous Q12H  . metoprolol succinate  50 mg Oral Daily  .  nicotine  14 mg Transdermal Daily  . sertraline  100 mg Oral BID  . sodium chloride flush  3 mL Intravenous Q12H   Infusions: . sodium chloride 75 mL/hr at 12/27/15 1933   PRN Meds: ipratropium-albuterol   Allergies as of 12/27/2015  . (No Known Allergies)    Family History  Problem Relation Age of Onset  . Alzheimer's disease Father   . Lung cancer Sister   . Breast cancer Sister   .  Cancer Mother     Social History   Social History  . Marital status: Married    Spouse name: N/A  . Number of children: N/A  . Years of education: N/A   Occupational History  . Not on file.   Social History Main Topics  . Smoking status: Current Every Day Smoker    Packs/day: 1.00  . Smokeless tobacco: Never Used  . Alcohol use No  . Drug use: No  . Sexual activity: Not Currently   Other Topics Concern  . Not on file   Social History Narrative  . No narrative on file    REVIEW OF SYSTEMS: Constitutional:  Weakness, anorixia ENT:  No nose bleeds Pulm:  DOE CV:  No palpitations, no LE edema. No  Chest pain.  GU:  No hematuria, no frequency GI:  Per HPI Heme:  Per HPI   Transfusions:  Per HPI Neuro:  No headaches, no peripheral tingling or numbness Derm:  No itching, no rash or sores.  Endocrine:  No sweats or chills.  No polyuria or dysuria Immunization:  Not queried Travel:  None beyond local counties in last few months.    PHYSICAL EXAM: Vital signs in last 24 hours: Vitals:   12/28/15 0307 12/28/15 0700  BP: (!) 145/63 (!) 157/70  Pulse: 87 90  Resp: 15 15  Temp: 98.4 F (36.9 C)    Wt Readings from Last 3 Encounters:  12/27/15 51.1 kg (112 lb 10.5 oz)  12/27/15 47.6 kg (105 lb)  10/28/15 52.8 kg (116 lb 5 oz)    General: cachectic, frail, chronically ill looking.  worried Head:  No asymmetry or swelling.  No trauma  Eyes:  No icterus, no conj pallor Ears:  Large, erythematous, slightly HOH  Nose:  No discharge Mouth:  Moist and clear MM.  Some  missing teeth, some caries Neck:  No mass, no JVD Lungs:  Clear bil but diminished.  No dyspnea Heart: RRR.  No mrg.  S1, s2 present Abdomen:  Soft, thin.  NT.  ND.  No bruits or HSM.  No masses.   Rectal: deferred   Musc/Skeltl: arthritic changes in fingers/hands Extremities:  No CCE.  Feet warm  Neurologic:  Oriented x 3.  Left arm weakness, no tremor.  Skin:  No sores or rash Tattoos:  none   Psych:  Anxious.  Cooperative pleasant.   Intake/Output from previous day: 08/27 0701 - 08/28 0700 In: 1881.8 [I.V.:1161.8; Blood:670; IV Piggyback:50] Out: -  Intake/Output this shift: No intake/output data recorded.  LAB RESULTS:  Recent Labs  12/27/15 1121 12/27/15 1930 12/28/15 0632  WBC 6.9 6.0 6.7  HGB 5.2* 6.1* 9.6*  HCT 17.3* 20.8* 30.9*  PLT 407 374 317   BMET Lab Results  Component Value Date   NA 135 12/28/2015   NA 131 (L) 12/27/2015   NA 137 12/27/2015   K 3.8 12/28/2015   K 3.8 12/27/2015   K 4.3 12/27/2015   CL 105 12/28/2015   CL 103 12/27/2015   CL 104 12/27/2015   CO2 23 12/28/2015   CO2 22 12/27/2015   CO2 26 12/27/2015   GLUCOSE 86 12/28/2015   GLUCOSE 87 12/27/2015   GLUCOSE 102 (H) 12/27/2015   BUN 9 12/28/2015   BUN 11 12/27/2015   BUN 14 12/27/2015   CREATININE 0.66 12/28/2015   CREATININE 0.67 12/27/2015   CREATININE 0.67 12/27/2015   CALCIUM 8.6 (L) 12/28/2015   CALCIUM 8.3 (L) 12/27/2015   CALCIUM 9.0  12/27/2015   LFT  Recent Labs  12/27/15 1121 12/27/15 1930 12/28/15 0632  PROT 6.8 5.6* 5.8*  ALBUMIN 3.5 3.0* 3.0*  AST 27 25 23   ALT 22 19 18   ALKPHOS 58 51 54  BILITOT 0.1* 0.7 1.1   PT/INR Lab Results  Component Value Date   INR 2.13 12/28/2015   INR 2.83 12/27/2015   INR 3.6 (H) 11/24/2015   Hepatitis Panel No results for input(s): HEPBSAG, HCVAB, HEPAIGM, HEPBIGM in the last 72 hours. C-Diff No components found for: CDIFF Lipase     Component Value Date/Time   LIPASE 30 08/09/2015 0035    Drugs of Abuse    No results found for: LABOPIA, COCAINSCRNUR, LABBENZ, AMPHETMU, THCU, LABBARB   RADIOLOGY STUDIES: Dg Chest 2 View  Result Date: 12/27/2015 CLINICAL DATA:  Weakness.  Concern for pneumonia. EXAM: CHEST  2 VIEW COMPARISON:  11/24/2015 FINDINGS: Moderate hiatal hernia. Heart and mediastinal contours are within normal limits. No focal opacities or effusions. No acute bony abnormality. IMPRESSION: No active cardiopulmonary disease. Electronically Signed   By: Rolm Baptise M.D.   On: 12/27/2015 11:52   Ct Head Wo Contrast  Result Date: 12/27/2015 CLINICAL DATA:  Weakness for 2 weeks. EXAM: CT HEAD WITHOUT CONTRAST TECHNIQUE: Contiguous axial images were obtained from the base of the skull through the vertex without intravenous contrast. COMPARISON:  10/26/2015 FINDINGS: Brain: Chronic encephalomalacia involving the left occipital lobe is identified and appears unchanged from previous exam. Large area of encephalomalacia within the distribution of the right MCA is again noted and also appears unchanged from previous exam. Prominence of the sulci. No acute intracranial hemorrhage, acute cortical infarct or mass identified. Vascular: No hyperdense vessel or unexpected calcification. Skull: The osseous skull is intact. Sinuses/Orbits: The mastoid air cells and paranasal sinuses are clear. Other: The calvarium is intact. IMPRESSION: No acute intracranial abnormality. 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 Electronically Signed   By: Kerby Moors M.D.   On: 12/27/2015 14:50    ENDOSCOPIC STUDIES: Per HPI  IMPRESSION:   *  Symptomatic, microcytic Anemia.  Hgb improved post PRBC x 3.  Has not been taking po iron or received parenteral iron.   *  Gastritis, gastric ulcers.  Took PPI BID for ~  1 month after discharge 08/2015.  Has avoided NSAIDs.     *  Dark but not melenic stool 1 week ago.   08/2015 EGD:   *  CAD  *  Chronic Coumadin for hx CVA, severe  CAD  *  UTI.  Had UTI in 08/2015 and completed another round of abx for UTI 2 weeks ago.  Rocephin ordered.      PLAN:     *  egd tomorrow.  Clears today.  Switch to po BID Protonix.  Advised pt and spouse she should never go off PPI.     Azucena Freed  12/28/2015, 8:10 AM Pager: [phone]

## 2015-12-30 DIAGNOSIS — E785 Hyperlipidemia, unspecified: Secondary | ICD-10-CM

## 2015-12-30 DIAGNOSIS — I10 Essential (primary) hypertension: Secondary | ICD-10-CM

## 2015-12-30 DIAGNOSIS — Z720 Tobacco use: Secondary | ICD-10-CM

## 2015-12-30 DIAGNOSIS — F418 Other specified anxiety disorders: Secondary | ICD-10-CM

## 2015-12-30 DIAGNOSIS — J432 Centrilobular emphysema: Secondary | ICD-10-CM

## 2015-12-30 DIAGNOSIS — H5347 Heteronymous bilateral field defects: Secondary | ICD-10-CM

## 2015-12-30 LAB — TYPE AND SCREEN
ABO/RH(D): O NEG
ANTIBODY SCREEN: NEGATIVE
UNIT DIVISION: 0
Unit division: 0

## 2015-12-30 LAB — BASIC METABOLIC PANEL
ANION GAP: 8 (ref 5–15)
BUN: 9 mg/dL (ref 6–20)
CALCIUM: 8.8 mg/dL — AB (ref 8.9–10.3)
CO2: 25 mmol/L (ref 22–32)
CREATININE: 0.65 mg/dL (ref 0.44–1.00)
Chloride: 108 mmol/L (ref 101–111)
GFR calc Af Amer: >60 mL/min (ref >60)
GLUCOSE: 102 mg/dL — AB (ref 65–99)
Potassium: 3.9 mmol/L (ref 3.5–5.1)
Sodium: 141 mmol/L (ref 135–145)

## 2015-12-30 LAB — CBC
HCT: 33.6 % — ABNORMAL LOW (ref 36.0–46.0)
Hemoglobin: 10 g/dL — ABNORMAL LOW (ref 12.0–15.0)
MCH: 24.6 pg — AB (ref 26.0–34.0)
MCHC: 29.8 g/dL — AB (ref 30.0–36.0)
MCV: 82.6 fL (ref 78.0–100.0)
PLATELETS: 314 10*3/uL (ref 150–400)
RBC: 4.07 MIL/uL (ref 3.87–5.11)
RDW: 17.8 % — AB (ref 11.5–15.5)
WBC: 7.2 10*3/uL (ref 4.0–10.5)

## 2015-12-30 LAB — PROTIME-INR
INR: 1.37
Prothrombin Time: 17 seconds — ABNORMAL HIGH (ref 11.4–15.2)

## 2015-12-30 LAB — PREPARE RBC (CROSSMATCH)

## 2015-12-30 MED ORDER — WARFARIN SODIUM 5 MG PO TABS
5.0000 mg | ORAL_TABLET | Freq: Once | ORAL | Status: AC
  Administered 2015-12-30: 5 mg via ORAL
  Filled 2015-12-30: qty 1

## 2015-12-30 MED ORDER — MOMETASONE FURO-FORMOTEROL FUM 200-5 MCG/ACT IN AERO
2.0000 | INHALATION_SPRAY | Freq: Two times a day (BID) | RESPIRATORY_TRACT | Status: DC
  Administered 2015-12-31: 2 via RESPIRATORY_TRACT
  Filled 2015-12-30: qty 8.8

## 2015-12-30 NOTE — Progress Notes (Signed)
Physical Therapy Treatment Patient Details Name: Michele Matthews MRN: [id number] DOB: 18-Aug-1945 Today's Date: 12/30/2015    History of Present Illness Michele Matthews is a 70 y.o. female with medical history significant of allergies, COPD/emphysema, CAD, severe coronary artery disease (on warfarin and aspirin), depression, stroke, GERD, hyperlipidemia, hypertension, seizures, on warfarin who was transferred from [facility] for GI evaluation due to severe anemia (Hgb 5.2) and recent melena.      PT Comments    Attempted to ambulate, however pt became anxious after taking a couple steps and stated she needed to sit. Performed seated BUE/LE exercises. Pt/family concerned that 2 of her anxiety meds were discontinued by her doctor about a month ago and ever since she's had panic attacks when attempting to walk. RN aware of these concerns.    Follow Up Recommendations  Home health PT;Supervision/Assistance - 24 hour     Equipment Recommendations  None recommended by PT    Recommendations for Other Services       Precautions / Restrictions Precautions Precautions: Fall Precaution Comments: pt denies falls in past couple years Restrictions Weight Bearing Restrictions: No    Mobility  Bed Mobility Overal bed mobility: Needs Assistance Bed Mobility: Supine to Sit;Sit to Supine     Supine to sit: Modified independent (Device/Increase time);HOB elevated     General bed mobility comments: HOB up, no physical assist  Transfers Overall transfer level: Needs assistance Equipment used: Rolling walker (2 wheeled) Transfers: Sit to/from [facility] Sit to Stand: Max assist Stand pivot transfers: Mod assist       General transfer comment: max A to power up, VCs hand placement and for safety, pt stands with flexed trunk (which is baseline due to back problems per family)  Ambulation/Gait Ambulation/Gait assistance: Min assist Ambulation Distance (Feet): 4 Feet Assistive  device: Rolling walker (2 wheeled) Gait Pattern/deviations: Step-to pattern;Decreased step length - right;Decreased step length - left;Wide base of support   Gait velocity interpretation: Below normal speed for age/gender General Gait Details: wore her shoes at her request, was important due to equinovarus tendency with risk for ankle injury if just in socks (RN aware); patient anxious, stated she needed to sit bc she was feeling hot,  HR up to 114 with mobility. Family stated pt has had panic attacks when attempting to walk for past month, her Dr discontinued 2 anti anxiety meds a month ago. RN notified.    Stairs            Wheelchair Mobility    Modified Rankin (Stroke Patients Only)       Balance     Sitting balance-Leahy Scale: Good Sitting balance - Comments: able to don shoes at EOB and prior to returning to supine don socks at EOB   Standing balance support: Bilateral upper extremity supported Standing balance-Leahy Scale: Poor                      Cognition Arousal/Alertness: Awake/alert Behavior During Therapy: WFL for tasks assessed/performed Overall Cognitive Status: Within Functional Limits for tasks assessed                      Exercises General Exercises - Upper Extremity Shoulder Flexion: AROM;Both;10 reps;Seated General Exercises - Lower Extremity Ankle Circles/Pumps: AROM;Both;10 reps;Seated Long Arc Quad: AROM;Both;10 reps;Seated Hip Flexion/Marching: AROM;Both;10 reps;Seated    General Comments        Pertinent Vitals/Pain Pain Assessment: No/denies pain    Home Living  Prior Function            PT Goals (current goals can now be found in the care plan section) Acute Rehab PT Goals Patient Stated Goal: To return to independent PT Goal Formulation: With patient/family Time For Goal Achievement: 01/05/16 Potential to Achieve Goals: Fair Progress towards PT goals: Not progressing toward  goals - comment (anxiety limiting progress)    Frequency  Min 3X/week    PT Plan Current plan remains appropriate    Co-evaluation             End of Session Equipment Utilized During Treatment: Gait belt Activity Tolerance: Patient limited by fatigue (and anxiety) Patient left: with call bell/phone within reach;with family/visitor present;in chair     Time: 1343-1413 PT Time Calculation (min) (ACUTE ONLY): 30 min  Charges:  $Gait Training: 8-22 mins $Therapeutic Exercise: 8-22 mins                    G Codes:      Philomena Doheny 12/30/2015, 2:33 PM [phone]

## 2015-12-30 NOTE — Progress Notes (Signed)
Upon arrival to [facility]. Patient placed on tele box #[id number]. Called and verified with CCMD. Michele Matthews, Michele Matthews

## 2015-12-30 NOTE — Progress Notes (Signed)
ANTICOAGULATION CONSULT NOTE - Initial Consult  Pharmacy Consult for warfarin Indication: Hx CVA/severe carotid stenosis  No Known Allergies  Patient Measurements: Height: 5\' 6"  (167.6 cm) Weight: 112 lb 10.5 oz (51.1 kg) IBW/kg (Calculated) : 59.3  Vital Signs: Temp: 97.6 F (36.4 C) (08/30 0338) Temp Source: Oral (08/30 0338) BP: 147/59 (08/30 0338) Pulse Rate: 73 (08/30 0338)  Labs:  Recent Labs  12/27/15 1121  12/28/15 0632 12/28/15 1622 12/29/15 0424 12/30/15 0357  HGB 5.2*  < > 9.6* 9.6* 10.0* 10.0*  HCT 17.3*  < > 30.9* 31.1* 32.8* 33.6*  PLT 407  < > 317 319 316 314  LABPROT 30.3*  --  24.2*  --  19.3* 17.0*  INR 2.83  --  2.13  --  1.60 1.37  CREATININE 0.67  < > 0.66  --  0.59 0.65  TROPONINI 1.09*  --   --   --   --   --   < > = values in this interval not displayed.  Estimated Creatinine Clearance: 52 mL/min (by C-G formula based on SCr of 0.8 mg/dL).   Medical History: Past Medical History:  Diagnosis Date  . Allergy   . COPD (chronic obstructive pulmonary disease) ([place])   . Coronary artery disease   . Depression   . Emphysema of lung ([place])   . GERD (gastroesophageal reflux disease)   . Hyperlipidemia   . Hypertension   . Seizures ([place])   . Stroke [facility])     Medications:  Scheduled:  . cefTRIAXone (ROCEPHIN)  IV  1 g Intravenous Q24H  . feeding supplement  1 Container Oral TID BM  . guaiFENesin  1,200 mg Oral BID  . metoprolol succinate  50 mg Oral Daily  . nicotine  14 mg Transdermal Daily  . pantoprazole (PROTONIX) IVPB  80 mg Intravenous Once  . pantoprazole  40 mg Oral BID  . sertraline  100 mg Oral BID  . sodium chloride flush  3 mL Intravenous Q12H  . warfarin  5 mg Oral ONCE-1800  . Warfarin - Pharmacist Dosing Inpatient   Does not apply q1800    Assessment: 61 YOF admitted for GIB. Warfarin PTA for Hx of CVA/severe carotid stenosis - cleared to restart 8/29 by GI. Therapeutic INR on admission; held since 8/27.   INR  SUBtherapeutic today at 1.37. CBC stable - Hgb 10.0, plt WNL. S/P 3 units PRBC.   PTA warfarin dose: 2.5mg  daily except 5mg  on Thursdays and Sundays   Goal of Therapy:  INR 2-3 Monitor platelets by anticoagulation protocol: Yes   Plan:  -Warfarin 5mg  x1 tonight -Daily INR -Monitor CBC, s/sx bleeding   Stephens November, PharmD Clinical Pharmacist 8:06 AM, 12/30/2015

## 2015-12-30 NOTE — Progress Notes (Signed)
Report called to Tamra,RN on [address]. VSS. Transferred to [place] via wheelchair with personal belongings. Patients husband is at the bedside and aware of new room number.  [facility]

## 2015-12-30 NOTE — Progress Notes (Signed)
PROGRESS NOTE    Michele Matthews  [id number] DOB: 1946-03-05 DOA: 12/27/2015 PCP: No primary care provider on file.   Subjective: Seen with her husband at bedside, EGD showed benign-appearing esophageal stenosis with bleeding erosive gastropathy. Continue PPI, appears to be stable, transferred to regular bed, if stable can be discharged in a.m.  Brief Narrative:  Michele Matthews is a 70 y.o. female with medical history significant of allergies, COPD/emphysema, CAD, severe coronary artery disease (on warfarin and aspirin), depression, stroke, GERD, hyperlipidemia, hypertension, seizures, on warfarin who was transferred from [facility] for GI evaluation after presenting there with a history of progressively worse weakness for the past 2 weeks, melena 4 during 2 different days, several days ago.   Per patient's husband, the patient has been feeling progressively weaker over the past week or 2. She states that she noticed that she was very weak after she came back from a wedding in [place] last week. She has been having increased difficulty with ambulation also complains of numbness in her lips, her fingers and left foot. Earlier this week, patient had melena twice a day for 2 days and increased dyspnea, nausea and positional dizziness, but denies abdominal pain, chest pain, palpitations, diaphoresis, PND, orthopnea or pitting edema of the lower extremities. She is an active smoker of three quarters of a pack of cigarettes a day.   ED Course: The patient received 1 unit of packed RBCs at [facility]. They do not have GI service at the moment and transferred the patient to this facility for GI consultation. Hemoglobin level was 5.2 g/dL.  Patient admitted to stepdown unit and patient was transfused 3 units packed red blood cells hemoglobin currently at 10. Patient underwent upper endoscopy which showed a benign-appearing esophageal stenosis with irregular GE junction which was  biopsied, large a shorter, bleeding erosive gastropathy.   Assessment & Plan:   Principal Problem:   Upper GI bleed Active Problems:   Symptomatic anemia   Chronic obstructive pulmonary disease (HCC)   Hemianopia   HLD (hyperlipidemia)   Current tobacco use   Long term current use of anticoagulant therapy   Seizure (HCC)   Anxiety and depression   Pressure ulcer   Essential hypertension   UTI (urinary tract infection)   Anemia, iron deficiency  #1 probable upper GI bleed Patient comes in with a symptomatic anemia hemoglobin noted to be 5.2 on admission and patient status post 3 units packed red blood cells hemoglobin currently at 10 from 5.2 on admission. Patient noted to have a upper endoscopy April 2017 after 2 weeks of melena and hemoglobin of 5.4 and the sentinel daily Aleve and no PPI. Upper endoscopy 08/2015 showed gastritis and at the 6 nonbleeding gastric ulcers largest measuring 5 mm. Patient denies any current NSAID use or aspirin.  Patient status post upper endoscopy 12/29/2015 showing benign-appearing esophageal stenosis with irregular GE junction, biopsied. Large high initial hernia. Bleeding erosive gastropathy, biopsied.  Patient states ran out of Protonix and had no refills and was not told she needed to be on it continuously.  Continue PPI daily per GI recommendations. Per GI okay to resume Coumadin. No NSAIDs. Patient will need to follow-up with her gastroenterologist for upper endoscopy as needed for surveillance based on pathology results and also would likely benefit from a colonoscopy to determine etiology of significant blood loss and anemia as outpatient.  GI recommended to continue 40 mg of pantoprazole daily, can resume Coumadin. No NSAIDs.  #2 symptomatic  anemia Likely secondary to problem #1. Patient status post 3 units packed red blood cells hemoglobin currently at 10 from 5.2 on admission. Clinical improvement. Follow H&H.  #3 probable UTI Urine cultures  pending. Continue IV Rocephin.   #4 hypertension Continue metoprolol.  #5 depression/anxiety Stable. Continue Zoloft.  #6 COPD/ongoing tobacco abuse Tobacco cessation. Continue nicotine patch. Nebs as needed.  #7 history of seizures Stable. Not on any AED. Outpatient follow-up.  #8 long-term use of anticoagulation therapy secondary to severe carotid artery disease and history of strokes Per GI okay to resume Coumadin today at prior home dose. Will have pharmacy dose Coumadin.  #9 history of seizures Admitting physician patient only had a single episode of seizures. Keppra was stopped a while back as patient did not have follow-up with her neurologist and no other episodes of seizures. The patient's husband stated PCP did not prescribe refills without neurology follow-up. Outpatient follow-up.  #9 prophylaxis PPI for GI prophylaxis. SCDs for DVT prophylaxis.  DVT: SCDs Disposition Plan: Home, hopefully tomorrow   Consultants:   Gastroenterology: Dr Silverio Decamp 12/28/2015  Procedures:  Chest x-ray 12/27/2015  3 units packed red blood cells 8/27-28/2017  Upper endoscopy 12/29/2015  Antimicrobials:   IV Rocephin 12/28/2015     Objective: Vitals:   12/30/15 0338 12/30/15 0700 12/30/15 0940 12/30/15 1053  BP: (!) 147/59 125/72 114/63 (!) 110/56  Pulse: 73 85 99 95  Resp: 13 11  18   Temp: 97.6 F (36.4 C) 98.1 F (36.7 C)  98.5 F (36.9 C)  TempSrc: Oral Oral  Oral  SpO2: 98% 97%  96%  Weight:      Height:        Intake/Output Summary (Last 24 hours) at 12/30/15 1314 Last data filed at 12/30/15 0941  Gross per 24 hour  Intake              173 ml  Output                0 ml  Net              173 ml   Filed Weights   12/27/15 1731  Weight: 51.1 kg (112 lb 10.5 oz)    Examination:  General exam: Appears calm and comfortable  Respiratory system: Clear to auscultation. Respiratory effort normal. Cardiovascular system: S1 & S2 heard, RRR. No JVD, murmurs,  rubs, gallops or clicks. No pedal edema. Gastrointestinal system: Abdomen is nondistended, soft and nontender. No organomegaly or masses felt. Normal bowel sounds heard. Central nervous system: Alert and oriented. No focal neurological deficits. Extremities: Symmetric 5 x 5 power. Skin: No rashes, lesions or ulcers Psychiatry: Judgement and insight appear normal. Mood & affect appropriate.     Data Reviewed: I have personally reviewed following labs and imaging studies  CBC:  Recent Labs Lab 12/27/15 1121 12/27/15 1930 12/28/15 0632 12/28/15 1622 12/29/15 0424 12/30/15 0357  WBC 6.9 6.0 6.7 7.1 6.5 7.2  NEUTROABS 5.6 4.2 5.4  --   --   --   HGB 5.2* 6.1* 9.6* 9.6* 10.0* 10.0*  HCT 17.3* 20.8* 30.9* 31.1* 32.8* 33.6*  MCV 72.1* 77.3* 80.1 79.3 80.8 82.6  PLT 407 374 317 319 316 [id number]   Basic Metabolic Panel:  Recent Labs Lab 12/27/15 1121 12/27/15 1930 12/28/15 0632 12/29/15 0424 12/30/15 0357  NA 137 131* 135 138 141  K 4.3 3.8 3.8 3.6 3.9  CL 104 103 105 107 108  CO2 26 22 23 24 25   GLUCOSE  102* 87 86 89 102*  BUN 14 11 9 6 9   CREATININE 0.67 0.67 0.66 0.59 0.65  CALCIUM 9.0 8.3* 8.6* 8.5* 8.8*  MG 2.0 2.0  --   --   --    GFR: Estimated Creatinine Clearance: 52 mL/min (by C-G formula based on SCr of 0.8 mg/dL). Liver Function Tests:  Recent Labs Lab 12/27/15 1121 12/27/15 1930 12/28/15 0632  AST 27 25 23   ALT 22 19 18   ALKPHOS 58 51 54  BILITOT 0.1* 0.7 1.1  PROT 6.8 5.6* 5.8*  ALBUMIN 3.5 3.0* 3.0*   No results for input(s): LIPASE, AMYLASE in the last 168 hours. No results for input(s): AMMONIA in the last 168 hours. Coagulation Profile:  Recent Labs Lab 12/27/15 1121 12/28/15 0632 12/29/15 0424 12/30/15 0357  INR 2.83 2.13 1.60 1.37   Cardiac Enzymes:  Recent Labs Lab 12/27/15 1121  TROPONINI 1.09*   BNP (last 3 results) No results for input(s): PROBNP in the last 8760 hours. HbA1C: No results for input(s): HGBA1C in the last 72  hours. CBG: No results for input(s): GLUCAP in the last 168 hours. Lipid Profile: No results for input(s): CHOL, HDL, LDLCALC, TRIG, CHOLHDL, LDLDIRECT in the last 72 hours. Thyroid Function Tests: No results for input(s): TSH, T4TOTAL, FREET4, T3FREE, THYROIDAB in the last 72 hours. Anemia Panel: No results for input(s): VITAMINB12, FOLATE, FERRITIN, TIBC, IRON, RETICCTPCT in the last 72 hours. Sepsis Labs: No results for input(s): PROCALCITON, LATICACIDVEN in the last 168 hours.  Recent Results (from the past 240 hour(s))  MRSA PCR Screening     Status: None   Collection Time: 12/27/15  6:23 PM  Result Value Ref Range Status   MRSA by PCR NEGATIVE NEGATIVE Final    Comment:        The GeneXpert MRSA Assay (FDA approved for NASAL specimens only), is one component of a comprehensive MRSA colonization surveillance program. It is not intended to diagnose MRSA infection nor to guide or monitor treatment for MRSA infections.   Urine culture     Status: Abnormal (Preliminary result)   Collection Time: 12/28/15  9:35 AM  Result Value Ref Range Status   Specimen Description URINE, RANDOM  Final   Special Requests NONE  Final   Culture (A)  Final    10,000 COLONIES/mL GRAM NEGATIVE RODS REPEATING ID    Report Status PENDING  Incomplete         Radiology Studies: No results found.      Scheduled Meds: . cefTRIAXone (ROCEPHIN)  IV  1 g Intravenous Q24H  . feeding supplement  1 Container Oral TID BM  . guaiFENesin  1,200 mg Oral BID  . metoprolol succinate  50 mg Oral Daily  . nicotine  14 mg Transdermal Daily  . pantoprazole (PROTONIX) IVPB  80 mg Intravenous Once  . pantoprazole  40 mg Oral BID  . sertraline  100 mg Oral BID  . sodium chloride flush  3 mL Intravenous Q12H  . warfarin  5 mg Oral ONCE-1800  . Warfarin - Pharmacist Dosing Inpatient   Does not apply q1800   Continuous Infusions: . sodium chloride 75 mL/hr at 12/30/15 0800     LOS: 3 days     Time spent: 35 minutes    Yardley Lekas A, MD [facility] Pager [phone]  If 7PM-7AM, please contact night-coverage [email or web address] Password [email or web address] 12/30/2015, 1:14 PM

## 2015-12-30 NOTE — Care Management Important Message (Signed)
Important Message  Patient Details  Name: Michele Matthews MRN: [id number] Date of Birth: May 21, 1945   Medicare Important Message Given:  Yes    Nathen May 12/30/2015, 1:17 PM

## 2015-12-31 ENCOUNTER — Encounter: Payer: Self-pay | Admitting: Gastroenterology

## 2015-12-31 LAB — TYPE AND SCREEN
ABO/RH(D): O NEG
ANTIBODY SCREEN: NEGATIVE
UNIT DIVISION: 0
UNIT DIVISION: 0
UNIT DIVISION: 0
Unit division: 0

## 2015-12-31 LAB — CBC
HCT: 34.8 % — ABNORMAL LOW (ref 36.0–46.0)
Hemoglobin: 9.9 g/dL — ABNORMAL LOW (ref 12.0–15.0)
MCH: 23.8 pg — ABNORMAL LOW (ref 26.0–34.0)
MCHC: 28.4 g/dL — AB (ref 30.0–36.0)
MCV: 83.7 fL (ref 78.0–100.0)
PLATELETS: 274 10*3/uL (ref 150–400)
RBC: 4.16 MIL/uL (ref 3.87–5.11)
RDW: 18.3 % — AB (ref 11.5–15.5)
WBC: 7.1 10*3/uL (ref 4.0–10.5)

## 2015-12-31 LAB — BASIC METABOLIC PANEL
Anion gap: 8 (ref 5–15)
BUN: 13 mg/dL (ref 6–20)
CO2: 22 mmol/L (ref 22–32)
CREATININE: 0.61 mg/dL (ref 0.44–1.00)
Calcium: 8.7 mg/dL — ABNORMAL LOW (ref 8.9–10.3)
Chloride: 110 mmol/L (ref 101–111)
GFR calc Af Amer: >60 mL/min (ref >60)
GLUCOSE: 86 mg/dL (ref 65–99)
Potassium: 3.7 mmol/L (ref 3.5–5.1)
SODIUM: 140 mmol/L (ref 135–145)

## 2015-12-31 LAB — PROTIME-INR
INR: 1.56
Prothrombin Time: 18.9 seconds — ABNORMAL HIGH (ref 11.4–15.2)

## 2015-12-31 LAB — URINE CULTURE

## 2015-12-31 MED ORDER — PANTOPRAZOLE SODIUM 40 MG PO TBEC: 40.0000 mg | DELAYED_RELEASE_TABLET | Freq: Every day | ORAL | 2 refills | Status: DC

## 2015-12-31 NOTE — Discharge Summary (Addendum)
Physician Discharge Summary  Michele Matthews [id number] DOB: December 31, 1945 DOA: 12/27/2015  PCP: No primary care provider on file.  Admit date: 12/27/2015 Discharge date: 12/31/2015  Admitted From: Home Disposition: Home  Recommendations for Outpatient Follow-up:  1. Follow up with PCP in 1-2 weeks 2. Please obtain BMP/CBC in one week   Home Health: PT Equipment/Devices: None  Discharge Condition: Stabel CODE STATUS: Full Diet recommendation: Heart Healthy  Brief/Interim Summary: Michele Matthews is a 70 y.o. female with medical history significant of allergies, COPD/emphysema, CAD, severe coronary artery disease (on warfarin and aspirin), depression, stroke, GERD, hyperlipidemia, hypertension, seizures, on warfarin who was transferred from [facility] for GI evaluation after presenting there with a history of progressively worse weakness for the past 2 weeks, melena 4 during 2 different days, several days ago.   Per patient's husband, the patient has been feeling progressively weaker over the past week or 2. She states that she noticed that she was very weak after she came back from a wedding in [place] last week. She has been having increased difficulty with ambulation also complains of numbness in her lips, her fingers and left foot. Earlier this week, patient had melena twice a day for 2 days and increased dyspnea, nausea and positional dizziness, but denies abdominal pain, chest pain, palpitations, diaphoresis, PND, orthopnea or pitting edema of the lower extremities. She is an active smoker of three quarters of a pack of cigarettes a day.   Discharge Diagnoses:  Principal Problem:   Upper GI bleed Active Problems:   Symptomatic anemia   Chronic obstructive pulmonary disease (HCC)   Hemianopia   HLD (hyperlipidemia)   Current tobacco use   Long term current use of anticoagulant therapy   Seizure (HCC)   Anxiety and depression   Pressure ulcer   Essential hypertension   UTI (urinary  tract infection)   Anemia, iron deficiency   #1 Upper GI bleed Patient comes in with a symptomatic anemia hemoglobin noted to be 5.2 on admission and patient status post 3 units packed red blood cells hemoglobin currently at 10 from 5.2 on admission. Patient noted to have a upper endoscopy April 2017 after 2 weeks of melena and hemoglobin of 5.4 and the sentinel daily Aleve and no PPI. Upper endoscopy 08/2015 showed gastritis and at the 6 nonbleeding gastric ulcers largest measuring 5 mm. Patient denies any current NSAID use or aspirin.  Patient status post upper endoscopy 12/29/2015 showing benign-appearing esophageal stenosis with irregular GE junction, biopsied. Large high initial hernia. Bleeding erosive gastropathy, biopsied.  Patient states ran out of Protonix and had no refills and was not told she needed to be on it continuously.  Continue PPI daily per GI recommendations. Per GI okay to resume Coumadin. No NSAIDs. Patient will need to follow-up with her gastroenterologist for upper endoscopy as needed for surveillance based on pathology results and also would likely benefit from a colonoscopy to determine etiology of significant blood loss and anemia as outpatient.  GI recommended to continue 40 mg of pantoprazole daily, can resume Coumadin. No NSAIDs.  #2 symptomatic anemia Likely secondary to problem #1. Patient status post 3 units packed red blood cells hemoglobin currently at 10 from 5.2 on admission. Clinical improvement. Follow H&H.  #3 probable UTI Urine cultures pending. Continue IV Rocephin.   #4 hypertension Continue metoprolol.  #5 depression/anxiety Stable. Continue Zoloft.  #6 COPD/ongoing tobacco abuse Tobacco cessation. Continue nicotine patch. Nebs as needed.  #7 history of seizures Stable. Not on any  AED. Outpatient follow-up.  #8 long-term use of anticoagulation therapy secondary to severe carotid artery disease and history of strokes Per GI okay to  resume Coumadin today at prior home dose. Will have pharmacy dose Coumadin.  #9 history of seizures Admitting physician patient only had a single episode of seizures. Keppra was stopped a while back as patient did not have follow-up with her neurologist and no other episodes of seizures. The patient's husband stated PCP did not prescribe refills without neurology follow-up. Outpatient follow-up.  Discharge Instructions  Discharge Instructions    Diet - low sodium heart healthy    Complete by:  As directed   Increase activity slowly    Complete by:  As directed       Medication List    TAKE these medications   acetaminophen 500 MG tablet Commonly known as:  TYLENOL Take 1,000 mg by mouth See admin instructions. Take 2 tablets (1000 mg) by mouth every morning, may also take 2 tablets in the afternoon as needed for pain   albuterol 108 (90 Base) MCG/ACT inhaler Commonly known as:  PROVENTIL HFA;VENTOLIN HFA Inhale 2 puffs into the lungs every 6 (six) hours as needed for wheezing or shortness of breath.   aspirin EC 81 MG tablet Take 81 mg by mouth daily.   atenolol 50 MG tablet Commonly known as:  TENORMIN Take 50 mg by mouth 2 (two) times daily.   atorvastatin 10 MG tablet Commonly known as:  LIPITOR Take 10 mg by mouth daily.   diphenhydramine-acetaminophen 25-500 MG Tabs tablet Commonly known as:  TYLENOL PM Take 2 tablets by mouth at bedtime.   ferrous sulfate 325 (65 FE) MG tablet Take 325 mg by mouth daily with breakfast.   Fluticasone-Salmeterol 250-50 MCG/DOSE Aepb Commonly known as:  ADVAIR Inhale 1 puff into the lungs 2 (two) times daily.   levETIRAcetam 1000 MG tablet Commonly known as:  KEPPRA Take 1,000 mg by mouth 2 (two) times daily. For seizures   nicotine 14 mg/24hr patch Commonly known as:  NICODERM CQ - dosed in mg/24 hours Place 1 patch (14 mg total) onto the skin daily.   pantoprazole 40 MG tablet Commonly known as:  PROTONIX Take 1 tablet  (40 mg total) by mouth daily. What changed:  when to take this   sertraline 100 MG tablet Commonly known as:  ZOLOFT Take 100 mg by mouth 2 (two) times daily.   tiotropium 18 MCG inhalation capsule Commonly known as:  SPIRIVA Place 1 capsule (18 mcg total) into inhaler and inhale daily. What changed:  when to take this  reasons to take this   VISINE OP Place 1 drop into both eyes 2 (two) times daily as needed (dry eyes).   warfarin 5 MG tablet Commonly known as:  COUMADIN Take 2.5-5 mg by mouth See admin instructions. Take 1 tablet (5 mg) by mouth with supper on Thursday and Sunday, take 1/2 tablet (2.5 mg) on  Monday, Tuesday, Wednesday, Friday, Saturday with supper       No Known Allergies  Consultations:  Dr. Silverio Decamp with [facility]   Procedures/Studies: Dg Chest 2 View  Result Date: 12/27/2015 CLINICAL DATA:  Weakness.  Concern for pneumonia. EXAM: CHEST  2 VIEW COMPARISON:  11/24/2015 FINDINGS: Moderate hiatal hernia. Heart and mediastinal contours are within normal limits. No focal opacities or effusions. No acute bony abnormality. IMPRESSION: No active cardiopulmonary disease. Electronically Signed   By: Rolm Baptise M.D.   On: 12/27/2015 11:52  Ct Head Wo Contrast  Result Date: 12/27/2015 CLINICAL DATA:  Weakness for 2 weeks. EXAM: CT HEAD WITHOUT CONTRAST TECHNIQUE: Contiguous axial images were obtained from the base of the skull through the vertex without intravenous contrast. COMPARISON:  10/26/2015 FINDINGS: Brain: Chronic encephalomalacia involving the left occipital lobe is identified and appears unchanged from previous exam. Large area of encephalomalacia within the distribution of the right MCA is again noted and also appears unchanged from previous exam. Prominence of the sulci. No acute intracranial hemorrhage, acute cortical infarct or mass identified. Vascular: No hyperdense vessel or unexpected calcification. Skull: The osseous skull is intact.  Sinuses/Orbits: The mastoid air cells and paranasal sinuses are clear. Other: The calvarium is intact. IMPRESSION: No acute intracranial abnormality. 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 Electronically Signed   By: Kerby Moors M.D.   On: 12/27/2015 14:50   (Echo, Carotid, EGD, Colonoscopy, ERCP)   EGD done on 12/29/15 Impression:                - Benign-appearing esophageal stenosis with  irregular EG junction. Biopsied. - Large hiatal hernia. - Bleeding erosive gastropathy. Biopsied. - Normal examined duodenum.  Recommendation:           - Resume diet.                           - Continue present medications. PPI (Pantoprazole                            40mg ) 30 mins before breakfast daily                           - Resume Coumadin (warfarin) at prior dose today.                            Refer to primary physician for further adjustment                            of therapy.                           - No ibuprofen, naproxen, or other non-steroidal                            anti-inflammatory drugs.                           - Await pathology results.                           - Repeat upper endoscopy PRN for surveillance based                            on pathology results.                           - Patient will need colonoscopy to determine the                            etiology of  significant blood loss and anemia, can                            be scheduled as outpatient with her primary GI                            given doesnt have ongoing active bleed.                           -Ok to discharge home from GI perspective  Subjective:   Discharge Exam: Vitals:   12/31/15 0747 12/31/15 0828  BP:  127/71  Pulse: 94 89  Resp: 18 17  Temp:  98 F (36.7 C)   Vitals:   12/31/15 0506 12/31/15 0600 12/31/15 0747 12/31/15 0828  BP: (!) 197/81 (!) 148/76  127/71  Pulse: 79 76 94 89  Resp: 20  18 17   Temp: 98.1 F (36.7 C)    98 F (36.7 C)  TempSrc: Oral   Oral  SpO2: 95%  98% 100%  Weight:      Height:        General: Pt is alert, awake, not in acute distress Cardiovascular: RRR, S1/S2 +, no rubs, no gallops Respiratory: CTA bilaterally, no wheezing, no rhonchi Abdominal: Soft, NT, ND, bowel sounds + Extremities: no edema, no cyanosis    The results of significant diagnostics from this hospitalization (including imaging, microbiology, ancillary and laboratory) are listed below for reference.     Microbiology: Recent Results (from the past 240 hour(s))  MRSA PCR Screening     Status: None   Collection Time: 12/27/15  6:23 PM  Result Value Ref Range Status   MRSA by PCR NEGATIVE NEGATIVE Final    Comment:        The GeneXpert MRSA Assay (FDA approved for NASAL specimens only), is one component of a comprehensive MRSA colonization surveillance program. It is not intended to diagnose MRSA infection nor to guide or monitor treatment for MRSA infections.   Urine culture     Status: Abnormal   Collection Time: 12/28/15  9:35 AM  Result Value Ref Range Status   Specimen Description URINE, RANDOM  Final   Special Requests NONE  Final   Culture 10,000 COLONIES/mL ESCHERICHIA COLI (A)  Final   Report Status 12/31/2015 FINAL  Final   Organism ID, Bacteria ESCHERICHIA COLI (A)  Final      Susceptibility   Escherichia coli - MIC*    AMPICILLIN 8 SENSITIVE Sensitive     CEFAZOLIN <=4 SENSITIVE Sensitive     CEFTRIAXONE <=1 SENSITIVE Sensitive     CIPROFLOXACIN <=0.25 SENSITIVE Sensitive     GENTAMICIN <=1 SENSITIVE Sensitive     IMIPENEM <=0.25 SENSITIVE Sensitive     NITROFURANTOIN 64 INTERMEDIATE Intermediate     TRIMETH/SULFA <=20 SENSITIVE Sensitive     AMPICILLIN/SULBACTAM 4 SENSITIVE Sensitive     PIP/TAZO <=4 SENSITIVE Sensitive     Extended ESBL NEGATIVE Sensitive     * 10,000 COLONIES/mL ESCHERICHIA COLI     Labs: BNP (last 3 results) No results for input(s): BNP in the last 8760  hours. Basic Metabolic Panel:  Recent Labs Lab 12/27/15 1121 12/27/15 1930 12/28/15 0632 12/29/15 0424 12/30/15 0357 12/31/15 0358  NA 137 131* 135 138 141 140  K 4.3 3.8 3.8 3.6 3.9 3.7  CL 104 103 105 107 108 110  CO2 26 22 23 24 25 22   GLUCOSE 102* 87 86 89 102* 86  BUN 14 11 9 6 9 13   CREATININE 0.67 0.67 0.66 0.59 0.65 0.61  CALCIUM 9.0 8.3* 8.6* 8.5* 8.8* 8.7*  MG 2.0 2.0  --   --   --   --    Liver Function Tests:  Recent Labs Lab 12/27/15 1121 12/27/15 1930 12/28/15 0632  AST 27 25 23   ALT 22 19 18   ALKPHOS 58 51 54  BILITOT 0.1* 0.7 1.1  PROT 6.8 5.6* 5.8*  ALBUMIN 3.5 3.0* 3.0*   No results for input(s): LIPASE, AMYLASE in the last 168 hours. No results for input(s): AMMONIA in the last 168 hours. CBC:  Recent Labs Lab 12/27/15 1121 12/27/15 1930 12/28/15 [id number] 12/28/15 1622 12/29/15 0424 12/30/15 0357 12/31/15 0358  WBC 6.9 6.0 6.7 7.1 6.5 7.2 7.1  NEUTROABS 5.6 4.2 5.4  --   --   --   --   HGB 5.2* 6.1* 9.6* 9.6* 10.0* 10.0* 9.9*  HCT 17.3* 20.8* 30.9* 31.1* 32.8* 33.6* 34.8*  MCV 72.1* 77.3* 80.1 79.3 80.8 82.6 83.7  PLT 407 374 317 319 316 314 274   Cardiac Enzymes:  Recent Labs Lab 12/27/15 1121  TROPONINI 1.09*   BNP: Invalid input(s): POCBNP CBG: No results for input(s): GLUCAP in the last 168 hours. D-Dimer No results for input(s): DDIMER in the last 72 hours. Hgb A1c No results for input(s): HGBA1C in the last 72 hours. Lipid Profile No results for input(s): CHOL, HDL, LDLCALC, TRIG, CHOLHDL, LDLDIRECT in the last 72 hours. Thyroid function studies No results for input(s): TSH, T4TOTAL, T3FREE, THYROIDAB in the last 72 hours.  Invalid input(s): FREET3 Anemia work up No results for input(s): VITAMINB12, FOLATE, FERRITIN, TIBC, IRON, RETICCTPCT in the last 72 hours. Urinalysis    Component Value Date/Time   COLORURINE YELLOW (A) 12/27/2015 1121   APPEARANCEUR HAZY (A) 12/27/2015 1121   APPEARANCEUR Hazy 03/27/2014 1845    LABSPEC 1.015 12/27/2015 1121   LABSPEC 1.028 03/27/2014 1845   PHURINE 6.0 12/27/2015 1121   GLUCOSEU NEGATIVE 12/27/2015 1121   GLUCOSEU Negative 03/27/2014 1845   HGBUR NEGATIVE 12/27/2015 1121   BILIRUBINUR NEGATIVE 12/27/2015 1121   BILIRUBINUR Negative 03/27/2014 1845   KETONESUR NEGATIVE 12/27/2015 1121   PROTEINUR NEGATIVE 12/27/2015 1121   NITRITE POSITIVE (A) 12/27/2015 1121   LEUKOCYTESUR 2+ (A) 12/27/2015 1121   LEUKOCYTESUR 3+ 03/27/2014 1845   Sepsis Labs Invalid input(s): PROCALCITONIN,  WBC,  LACTICIDVEN Microbiology Recent Results (from the past 240 hour(s))  MRSA PCR Screening     Status: None   Collection Time: 12/27/15  6:23 PM  Result Value Ref Range Status   MRSA by PCR NEGATIVE NEGATIVE Final    Comment:        The GeneXpert MRSA Assay (FDA approved for NASAL specimens only), is one component of a comprehensive MRSA colonization surveillance program. It is not intended to diagnose MRSA infection nor to guide or monitor treatment for MRSA infections.   Urine culture     Status: Abnormal   Collection Time: 12/28/15  9:35 AM  Result Value Ref Range Status   Specimen Description URINE, RANDOM  Final   Special Requests NONE  Final   Culture 10,000 COLONIES/mL ESCHERICHIA COLI (A)  Final   Report Status 12/31/2015 FINAL  Final   Organism ID, Bacteria ESCHERICHIA COLI (A)  Final      Susceptibility   Escherichia coli - MIC*  AMPICILLIN 8 SENSITIVE Sensitive     CEFAZOLIN <=4 SENSITIVE Sensitive     CEFTRIAXONE <=1 SENSITIVE Sensitive     CIPROFLOXACIN <=0.25 SENSITIVE Sensitive     GENTAMICIN <=1 SENSITIVE Sensitive     IMIPENEM <=0.25 SENSITIVE Sensitive     NITROFURANTOIN 64 INTERMEDIATE Intermediate     TRIMETH/SULFA <=20 SENSITIVE Sensitive     AMPICILLIN/SULBACTAM 4 SENSITIVE Sensitive     PIP/TAZO <=4 SENSITIVE Sensitive     Extended ESBL NEGATIVE Sensitive     * 10,000 COLONIES/mL ESCHERICHIA COLI     Time coordinating discharge:  Over 30 minutes  SIGNED:   Birdie Hopes, MD  [facility] 12/31/2015, 1:06 PM Pager   If 7PM-7AM, please contact night-coverage [email or web address] Password [email or web address]

## 2015-12-31 NOTE — Evaluation (Signed)
Occupational Therapy Evaluation Patient Details Name: Michele Matthews MRN: [id number] DOB: December 13, 1945 Today's Date: 12/31/2015    History of Present Illness Michele Matthews is a 70 y.o. female with medical history significant of allergies, COPD/emphysema, CAD, severe coronary artery disease (on warfarin and aspirin), depression, stroke, GERD, hyperlipidemia, hypertension, seizures, on warfarin who was transferred from [facility] for GI evaluation due to severe anemia (Hgb 5.2) and recent melena.     Clinical Impression   Pt admitted with anemia. Pt currently with functional limitations due to the deficits listed below (see OT Problem List).  Pt will benefit from skilled OT to increase their safety and independence with ADL and functional mobility for ADL to facilitate discharge to venue listed below.     Follow Up Recommendations  Home health OT    Equipment Recommendations  None recommended by OT       Precautions / Restrictions Precautions Precautions: Fall Precaution Comments: pt denies falls in past couple years Restrictions Weight Bearing Restrictions: No      Mobility Bed Mobility Overal bed mobility: Needs Assistance Bed Mobility: Supine to Sit;Sit to Supine     Supine to sit: Modified independent (Device/Increase time);HOB elevated Sit to supine: Min assist   General bed mobility comments: HOB up, no physical assist  Transfers Overall transfer level: Needs assistance Equipment used: 1 person hand held assist Transfers: Sit to/from [facility] Sit to Stand: Mod assist Stand pivot transfers: Mod assist                 ADL Overall ADL's : Needs assistance/impaired                 Upper Body Dressing : Minimal assistance;Sitting   Lower Body Dressing: Moderate assistance;Sit to/from stand   Toilet Transfer: Moderate assistance;Squat-pivot;BSC   Toileting- Clothing Manipulation and Hygiene: Moderate assistance;Sit to/from  stand;Sitting/lateral lean         General ADL Comments: family helps pt as needed               Pertinent Vitals/Pain Pain Assessment: No/denies pain     Hand Dominance Right   Extremity/Trunk Assessment Upper Extremity Assessment LUE Deficits / Details: residual weakness from previous stroke, has limited grasp           Communication Communication Communication: HOH   Cognition Arousal/Alertness: Awake/alert Behavior During Therapy: WFL for tasks assessed/performed Overall Cognitive Status: Within Functional Limits for tasks assessed                                Home Living Family/patient expects to be discharged to:: Private residence Living Arrangements: Spouse/significant other;Children Available Help at Discharge: Family;Available 24 hours/day Type of Home: House Home Access: Stairs to enter [employer] of Steps: 3 Entrance Stairs-Rails: Right Home Layout: One level     Bathroom Shower/Tub: Teacher, early years/pre: Handicapped height     Home Equipment: Environmental consultant - 2 wheels;Tub bench;Shower seat;Bedside commode;Hand held shower head          Prior Functioning/Environment Level of Independence: Needs assistance  Gait / Transfers Assistance Needed: normally able to walk on her own with walker, but last week needed help due to weakness (reports panic attacks, but likely was so anemic was pre-syncopal) ADL's / Homemaking Assistance Needed: husband helps with showering, and IADL's        OT Diagnosis: Generalized weakness   OT Problem List: Decreased  strength;Impaired UE functional use   OT Treatment/Interventions: Self-care/ADL training;Therapeutic exercise    OT Goals(Current goals can be found in the care plan section) Acute Rehab OT Goals Patient Stated Goal: To return to independent OT Goal Formulation: With patient Time For Goal Achievement: 01/07/16  OT Frequency: Min 2X/week              End of  Session Nurse Communication: Mobility status  Activity Tolerance: Patient tolerated treatment well Patient left: in bed;with call bell/phone within reach;with family/visitor present   Time: 1214-1232 OT Time Calculation (min): 18 min Charges:  OT General Charges $OT Visit: 1 Procedure OT Evaluation $OT Eval Moderate Complexity: 1 Procedure G-Codes:    Betsy Pries January 26, 2016, 12:39 PM

## 2016-03-15 ENCOUNTER — Encounter: Payer: Medicare HMO | Admitting: Family Medicine

## 2016-03-31 ENCOUNTER — Encounter: Payer: Self-pay | Admitting: *Deleted

## 2016-04-01 ENCOUNTER — Encounter: Payer: Self-pay | Admitting: Family Medicine

## 2016-04-01 ENCOUNTER — Ambulatory Visit (INDEPENDENT_AMBULATORY_CARE_PROVIDER_SITE_OTHER): Payer: Medicare HMO | Admitting: Family Medicine

## 2016-04-01 VITALS — BP 166/76 | HR 111 | Temp 98.2°F | Resp 16 | Ht 66.0 in | Wt 112.6 lb

## 2016-04-01 DIAGNOSIS — Z23 Encounter for immunization: Secondary | ICD-10-CM

## 2016-04-01 DIAGNOSIS — K922 Gastrointestinal hemorrhage, unspecified: Secondary | ICD-10-CM

## 2016-04-01 DIAGNOSIS — I10 Essential (primary) hypertension: Secondary | ICD-10-CM | POA: Diagnosis not present

## 2016-04-01 DIAGNOSIS — I639 Cerebral infarction, unspecified: Secondary | ICD-10-CM

## 2016-04-01 DIAGNOSIS — E782 Mixed hyperlipidemia: Secondary | ICD-10-CM | POA: Diagnosis not present

## 2016-04-01 DIAGNOSIS — F419 Anxiety disorder, unspecified: Principal | ICD-10-CM

## 2016-04-01 DIAGNOSIS — Z7901 Long term (current) use of anticoagulants: Secondary | ICD-10-CM

## 2016-04-01 DIAGNOSIS — K219 Gastro-esophageal reflux disease without esophagitis: Secondary | ICD-10-CM | POA: Diagnosis not present

## 2016-04-01 DIAGNOSIS — F32A Depression, unspecified: Secondary | ICD-10-CM

## 2016-04-01 DIAGNOSIS — D500 Iron deficiency anemia secondary to blood loss (chronic): Secondary | ICD-10-CM

## 2016-04-01 DIAGNOSIS — E43 Unspecified severe protein-calorie malnutrition: Secondary | ICD-10-CM | POA: Diagnosis not present

## 2016-04-01 DIAGNOSIS — F329 Major depressive disorder, single episode, unspecified: Secondary | ICD-10-CM

## 2016-04-01 DIAGNOSIS — R7309 Other abnormal glucose: Secondary | ICD-10-CM | POA: Diagnosis not present

## 2016-04-01 DIAGNOSIS — F418 Other specified anxiety disorders: Secondary | ICD-10-CM

## 2016-04-01 MED ORDER — ATORVASTATIN CALCIUM 10 MG PO TABS: 10.0000 mg | ORAL_TABLET | Freq: Every day | ORAL | 3 refills | Status: DC

## 2016-04-01 MED ORDER — HYDROXYZINE HCL 10 MG PO TABS: 10.0000 mg | ORAL_TABLET | Freq: Two times a day (BID) | ORAL | 2 refills | Status: DC | PRN

## 2016-04-01 MED ORDER — BUSPIRONE HCL 7.5 MG PO TABS: 7.5000 mg | ORAL_TABLET | Freq: Two times a day (BID) | ORAL | 1 refills | Status: DC

## 2016-04-01 MED ORDER — METOPROLOL TARTRATE 25 MG PO TABS: 25.0000 mg | ORAL_TABLET | Freq: Two times a day (BID) | ORAL | 3 refills | Status: DC

## 2016-04-01 MED ORDER — WARFARIN SODIUM 5 MG PO TABS: 2.5000 mg | ORAL_TABLET | ORAL | 5 refills | Status: DC

## 2016-04-01 MED ORDER — PANTOPRAZOLE SODIUM 40 MG PO TBEC: 40.0000 mg | DELAYED_RELEASE_TABLET | Freq: Every day | ORAL | 3 refills | Status: DC

## 2016-04-01 NOTE — Assessment & Plan Note (Signed)
Resolved. Check repeat CBC for follow-up Hgb Continue Protonix Remain off NSAIDs

## 2016-04-01 NOTE — Assessment & Plan Note (Signed)
Uncontrolled currently off of BB (atenolol due to back order and patient not requesting follow-up) also with tachycardia. Given complex stroke history suspect goal < 140/90. - Order requested Metoprolol for now, 25mg  BID, may need to change based on ins preference - Suspect will need additional medications if not at goal, consider ACEi

## 2016-04-01 NOTE — Assessment & Plan Note (Addendum)
Clinically consistent with >10% wt loss in 1 year, BMI is 18, and evidence of muscle wasting, including temporal region on exam. Low albumin 3.0. - Encourage regular diet, treat underlying depression - Recommend ensure or meal supplement - Future consider mirtazapine for appetite

## 2016-04-01 NOTE — Assessment & Plan Note (Signed)
GERD, complicated by gastritis and PUD, on last EGD with acute UGI Refilled Protonix 40mg  daily Follow-up with GI in future if refractory symptoms, follow-up EGD and Colonoscopy

## 2016-04-01 NOTE — Progress Notes (Signed)
Subjective:    Patient ID: Michele Matthews, female    DOB: 08-Jul-1945, 70 y.o.   MRN: [id number]  [name] is a 70 y.o. female presenting on 04/01/2016 for Bell's Palsy (muscle weakness numbness stiff neck onset 3 weeks)   HPI  CHRONIC HTN:  Reports previously was taking Atenolol 50mg  BID doing well but it was backordered and she was advised to change per ins, no known arrhythmia or MI. But does have known severe CAD (per chart review, patient was unaware) Current Meds - Atenolol 50mg  BID   Reports good compliance, did not take meds today since running out months ago. Was tolerating well, w/o complaints. Lifestyle - limited ambulation  HYPERLIPIDEMIA: - Reports no concerns. Last lipid panel 10/2013, controlled on atorvastatin 10mg  daily, tolerating well without myalgias  Hospital Follow-up S/p Upper GI Bleed with Erosive Gastropathy: - Hospitalized from 12/27/15 to 12/31/15 with acute on chronic blood loss anemia, worsening weakness, melena, transferred from [facility] to [facility] for GI evaluation, treated with 3u PRBC, iron therapy, and PPI, had EGD done 8/29 showed benign esophageal stenosis, irregular GE junction, large hiatal hernia, bleeding erosive gastropathy, and prior history of multiple ulcers on EGD previously, she was treated with PPI with Protonix 40mg  daily on discharge, and cleared to resume Warfarin and ASA, advised to DC NSAIDs. Future advised would need colonoscopy. - Today reports doing well without further dark stools or evidence of bleeding. She completed iron supplement. No longer has symptoms of anemia, but does still describe some recent worsening weakness.  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 Patient reports taking Warfarin since the first stroke 25 years ago, has been on stable dosing for long time almost  >10 years, but she had not been having PT/INR checked regularly, last was 3-4 months ago in hospital at INR 1.56, still taking same dose Warfarin (5mg  Thurs/Sun, 2.5mg  half tab M-T-W-Fri-Sat), also taking ASA 81mg  daily was started more recently within past few years - Patient states no longer following Neurology. Chart review shows last seen by Dr [facility] [phone]) for most recent stroke, she is considered high risk for recurrent ischemic stroke and was indicated for anticoagulation plus ASA, they discussed other new agent anticoagulants but patient did not decide to start any at that time. - Does not recall establishing with Cardiology for evaluation of possible AFib - Still actively smoking, she tried NRT without success - Last INR 1.56 - Prior history of seizure x 1 due to acute sepsis and infection, was on keppra now off 4-6 months now - Currently describes recent worsening in weakness Left sided and Right hand numbness gradual over few weeks without resolution.   Anxiety, Panic Attacks / Depression: - Chronic problems for most of her life, previously has seen Psychiatry in the past. Chronically on variety of medications in the past, does not recall. Was treated with Lorazepam for anxiety PRN within past 1-2 years, by prior PCP, but she has discontinued this and does not want to resume it, son was concerned about her taking this medication, and she is doing well off of it, but still has problems with anxiety. - Currently taking Zoloft 100mg  BID with good results reported by Son and patient, but her symptoms still seem uncontrolled by her reports of anxiety with occasional panic, insomnia difficulty sleeping taking Tylenol PM most  nights  Severe Protein Calorie malnutrition - Reports weight loss over past >1 year, over 10 lbs or near 10% wt loss, attributes some of this to poor appetite with mood disorder  TOBACCO ABUSE Active smoker. Not ready to quit. No longer using  NRT  Health Maintenance: - Due for Flu shot, will get high dose vaccine today  Past Medical History:  Diagnosis Date  . Allergy   . COPD (chronic obstructive pulmonary disease) ([place])   . Coronary artery disease   . Depression   . Emphysema of lung ([place])   . GERD (gastroesophageal reflux disease)   . Hyperlipidemia   . Hypertension   . Seizures ([place])   . Stroke [facility])    Social History   Social History  . Marital status: Married    Spouse name: N/A  . Number of children: N/A  . Years of education: N/A   Occupational History  . Not on file.   Social History Main Topics  . Smoking status: Current Every Day Smoker    Packs/day: 1.00  . Smokeless tobacco: Never Used  . Alcohol use No  . Drug use: No  . Sexual activity: Not Currently   Other Topics Concern  . Not on file   Social History Narrative  . No narrative on file   Family History  Problem Relation Age of Onset  . Alzheimer's disease Father   . Lung cancer Sister   . Breast cancer Sister   . Cancer Mother    Current Outpatient Prescriptions on File Prior to Visit  Medication Sig  . acetaminophen (TYLENOL) 500 MG tablet Take 1,000 mg by mouth See admin instructions. Take 2 tablets (1000 mg) by mouth every morning, may also take 2 tablets in the afternoon as needed for pain  . albuterol (PROVENTIL HFA;VENTOLIN HFA) 108 (90 Base) MCG/ACT inhaler Inhale 2 puffs into the lungs every 6 (six) hours as needed for wheezing or shortness of breath.  Marland Kitchen aspirin EC 81 MG tablet Take 81 mg by mouth daily.  . Fluticasone-Salmeterol (ADVAIR) 250-50 MCG/DOSE AEPB Inhale 1 puff into the lungs 2 (two) times daily.  . sertraline (ZOLOFT) 100 MG tablet Take 100 mg by mouth 2 (two) times daily.  . Tetrahydrozoline HCl (VISINE OP) Place 1 drop into both eyes 2 (two) times daily as needed (dry eyes).  Marland Kitchen tiotropium (SPIRIVA) 18 MCG inhalation capsule Place 1 capsule (18 mcg total) into inhaler and inhale daily. (Patient taking  differently: Place 18 mcg into inhaler and inhale daily as needed (shortness of breath). )   No current facility-administered medications on file prior to visit.     Review of Systems  Constitutional: Positive for activity change (reduced due to weakness) and appetite change (reduced). Negative for chills, diaphoresis, fatigue and fever.  HENT: Positive for hearing loss (chronic). Negative for congestion and sinus pressure.   Eyes: Negative for visual disturbance.  Respiratory: Negative for cough, chest tightness, shortness of breath and wheezing.   Cardiovascular: Negative for chest pain, palpitations and leg swelling.  Gastrointestinal: Negative for abdominal pain, constipation, diarrhea, nausea and vomiting.  Endocrine: Negative for cold intolerance, polydipsia and polyuria.  Genitourinary: Negative for dysuria, frequency and hematuria.  Musculoskeletal: Positive for gait problem (due to weakness, uses walker) and neck stiffness. Negative for arthralgias and neck pain.  Skin: Negative for rash.  Allergic/Immunologic: Negative for environmental allergies.  Neurological: Positive for weakness (left side arm and leg, chronic residual, with some worsening) and numbness (right hand). Negative for  dizziness, seizures, syncope, facial asymmetry, light-headedness and headaches.  Hematological: Negative for adenopathy.  Psychiatric/Behavioral: Positive for decreased concentration, dysphoric mood and sleep disturbance. Negative for agitation, behavioral problems, hallucinations, self-injury and suicidal ideas. The patient is nervous/anxious. The patient is not hyperactive.    Per HPI unless specifically indicated above  Depression screen [facility] 2/9 04/01/2016 08/07/2015 08/07/2015  Decreased Interest 1 2 2   Down, Depressed, Hopeless 2 3 3   PHQ - 2 Score 3 5 5   Altered sleeping 3 0 0  Tired, decreased energy 3 3 3   Change in appetite 2 3 3   Feeling bad or failure about yourself  3 2 2   Trouble  concentrating 1 0 0  Moving slowly or fidgety/restless 0 0 0  Suicidal thoughts 0 0 0  PHQ-9 Score 15 13 13   Difficult doing work/chores - - Somewhat difficult   GAD 7 : Generalized Anxiety Score 04/01/2016  Nervous, Anxious, on Edge 3  Control/stop worrying 3  Worry too much - different things 3  Trouble relaxing 3  Restless 2  Easily annoyed or irritable 3  Afraid - awful might happen 3  Total GAD 7 Score 20  Anxiety Difficulty Very difficult        Objective:    BP (!) 166/76   Pulse (!) 111   Temp 98.2 F (36.8 C) (Oral)   Resp 16   Ht 5\' 6"  (1.676 m)   Wt 112 lb 9.6 oz (51.1 kg)   SpO2 100%   BMI 18.17 kg/m   Wt Readings from Last 3 Encounters:  04/01/16 112 lb 9.6 oz (51.1 kg)  12/27/15 112 lb 10.5 oz (51.1 kg)  12/27/15 105 lb (47.6 kg)    Physical Exam  Constitutional: She is oriented to person, place, and time. She appears well-developed and well-nourished. No distress.  Elderly 70 yr Female older than stated age, chronically ill appearing, currently comfortable, cooperative  Walker for ambulation assistance  HENT:  Head: Normocephalic and atraumatic.  Mouth/Throat: Oropharynx is clear and moist.  Difficulty hearing conversational speech.  Oropharynx clear without erythema, exudates, edema or asymmetry.  Poor dentition.  Eyes: Conjunctivae and EOM are normal. Pupils are equal, round, and reactive to light. Right eye exhibits no discharge. Left eye exhibits no discharge.  Neck: Neck supple. No thyromegaly present.  Some reduced neck ROM actively without tenderness or asymmetry on exam.  Cardiovascular: Regular rhythm, normal heart sounds and intact distal pulses.   No murmur heard. Tachycardic  Pulmonary/Chest: Effort normal and breath sounds normal. No respiratory distress. She has no wheezes. She has no rales.  Musculoskeletal: She exhibits no edema.  Significant muscle wasting and atrophy bilateral upper and lower extremities.  Facial muscle  atrophy and wasting on bilateral temples, reduced skin turgor.  Lymphadenopathy:    She has no cervical adenopathy.  Neurological: She is alert and oriented to person, place, and time. No cranial nerve deficit.  Distal sensation to light touch reduced R hand.  Musclar strength 4/5 bilateral grip and knee flex/ext, ankle/dorsiflex, Left with some difficulty moving during exam with chronic residual stiffness.  Skin: Skin is warm and dry. No rash noted. She is not diaphoretic.  Psychiatric:  Good eye contact, difficulty following conversation due to poor hearing, appropriate speech and thoughts, behavior normal. Occasionally tearful when discussing depression.  Nursing note and vitals reviewed.    I have personally reviewed the following lab results from 12/2015.  Results for orders placed or performed during the hospital encounter of 12/27/15  MRSA PCR Screening  Result Value Ref Range   MRSA by PCR NEGATIVE NEGATIVE  Urine culture  Result Value Ref Range   Specimen Description URINE, RANDOM    Special Requests NONE    Culture 10,000 COLONIES/mL ESCHERICHIA COLI (A)    Report Status 12/31/2015 FINAL    Organism ID, Bacteria ESCHERICHIA COLI (A)       Susceptibility   Escherichia coli - MIC*    AMPICILLIN 8 SENSITIVE Sensitive     CEFAZOLIN <=4 SENSITIVE Sensitive     CEFTRIAXONE <=1 SENSITIVE Sensitive     CIPROFLOXACIN <=0.25 SENSITIVE Sensitive     GENTAMICIN <=1 SENSITIVE Sensitive     IMIPENEM <=0.25 SENSITIVE Sensitive     NITROFURANTOIN 64 INTERMEDIATE Intermediate     TRIMETH/SULFA <=20 SENSITIVE Sensitive     AMPICILLIN/SULBACTAM 4 SENSITIVE Sensitive     PIP/TAZO <=4 SENSITIVE Sensitive     Extended ESBL NEGATIVE Sensitive     * 10,000 COLONIES/mL ESCHERICHIA COLI  CBC WITH DIFFERENTIAL  Result Value Ref Range   WBC 6.7 4.0 - 10.5 K/uL   RBC 3.86 (L) 3.87 - 5.11 MIL/uL   Hemoglobin 9.6 (L) 12.0 - 15.0 g/dL   HCT 30.9 (L) 36.0 - 46.0 %   MCV 80.1 78.0 - 100.0 fL    MCH 24.9 (L) 26.0 - 34.0 pg   MCHC 31.1 30.0 - 36.0 g/dL   RDW 15.9 (H) 11.5 - 15.5 %   Platelets 317 150 - 400 K/uL   Neutrophils Relative % 80 %   Neutro Abs 5.4 1.7 - 7.7 K/uL   Lymphocytes Relative 8 %   Lymphs Abs 0.5 (L) 0.7 - 4.0 K/uL   Monocytes Relative 11 %   Monocytes Absolute 0.8 0.1 - 1.0 K/uL   Eosinophils Relative 1 %   Eosinophils Absolute 0.1 0.0 - 0.7 K/uL   Basophils Relative 0 %   Basophils Absolute 0.0 0.0 - 0.1 K/uL  Comprehensive metabolic panel  Result Value Ref Range   Sodium 135 135 - 145 mmol/L   Potassium 3.8 3.5 - 5.1 mmol/L   Chloride 105 101 - 111 mmol/L   CO2 23 22 - 32 mmol/L   Glucose, Bld 86 65 - 99 mg/dL   BUN 9 6 - 20 mg/dL   Creatinine, Ser 0.66 0.44 - 1.00 mg/dL   Calcium 8.6 (L) 8.9 - 10.3 mg/dL   Total Protein 5.8 (L) 6.5 - 8.1 g/dL   Albumin 3.0 (L) 3.5 - 5.0 g/dL   AST 23 15 - 41 U/L   ALT 18 14 - 54 U/L   Alkaline Phosphatase 54 38 - 126 U/L   Total Bilirubin 1.1 0.3 - 1.2 mg/dL   GFR calc non Af Amer >60 >60 mL/min   GFR calc Af Amer >60 >60 mL/min   Anion gap 7 5 - 15  CBC with Differential/Platelet  Result Value Ref Range   WBC 6.0 4.0 - 10.5 K/uL   RBC 2.69 (L) 3.87 - 5.11 MIL/uL   Hemoglobin 6.1 (LL) 12.0 - 15.0 g/dL   HCT 20.8 (L) 36.0 - 46.0 %   MCV 77.3 (L) 78.0 - 100.0 fL   MCH 22.7 (L) 26.0 - 34.0 pg   MCHC 29.3 (L) 30.0 - 36.0 g/dL   RDW 17.4 (H) 11.5 - 15.5 %   Platelets 374 150 - 400 K/uL   Neutrophils Relative % 70 %   Neutro Abs 4.2 1.7 - 7.7 K/uL   Lymphocytes Relative 17 %  Lymphs Abs 1.0 0.7 - 4.0 K/uL   Monocytes Relative 11 %   Monocytes Absolute 0.7 0.1 - 1.0 K/uL   Eosinophils Relative 1 %   Eosinophils Absolute 0.1 0.0 - 0.7 K/uL   Basophils Relative 0 %   Basophils Absolute 0.0 0.0 - 0.1 K/uL  Comprehensive metabolic panel  Result Value Ref Range   Sodium 131 (L) 135 - 145 mmol/L   Potassium 3.8 3.5 - 5.1 mmol/L   Chloride 103 101 - 111 mmol/L   CO2 22 22 - 32 mmol/L   Glucose, Bld 87 65 -  99 mg/dL   BUN 11 6 - 20 mg/dL   Creatinine, Ser 0.67 0.44 - 1.00 mg/dL   Calcium 8.3 (L) 8.9 - 10.3 mg/dL   Total Protein 5.6 (L) 6.5 - 8.1 g/dL   Albumin 3.0 (L) 3.5 - 5.0 g/dL   AST 25 15 - 41 U/L   ALT 19 14 - 54 U/L   Alkaline Phosphatase 51 38 - 126 U/L   Total Bilirubin 0.7 0.3 - 1.2 mg/dL   GFR calc non Af Amer >60 >60 mL/min   GFR calc Af Amer >60 >60 mL/min   Anion gap 6 5 - 15  Magnesium  Result Value Ref Range   Magnesium 2.0 1.7 - 2.4 mg/dL  Protime-INR  Result Value Ref Range   Prothrombin Time 24.2 (H) 11.4 - 15.2 seconds   INR 2.13   CBC  Result Value Ref Range   WBC 7.1 4.0 - 10.5 K/uL   RBC 3.92 3.87 - 5.11 MIL/uL   Hemoglobin 9.6 (L) 12.0 - 15.0 g/dL   HCT 31.1 (L) 36.0 - 46.0 %   MCV 79.3 78.0 - 100.0 fL   MCH 24.5 (L) 26.0 - 34.0 pg   MCHC 30.9 30.0 - 36.0 g/dL   RDW 16.3 (H) 11.5 - 15.5 %   Platelets 319 150 - 400 K/uL  Protime-INR  Result Value Ref Range   Prothrombin Time 19.3 (H) 11.4 - 15.2 seconds   INR 1.60   CBC  Result Value Ref Range   WBC 6.5 4.0 - 10.5 K/uL   RBC 4.06 3.87 - 5.11 MIL/uL   Hemoglobin 10.0 (L) 12.0 - 15.0 g/dL   HCT 32.8 (L) 36.0 - 46.0 %   MCV 80.8 78.0 - 100.0 fL   MCH 24.6 (L) 26.0 - 34.0 pg   MCHC 30.5 30.0 - 36.0 g/dL   RDW 16.8 (H) 11.5 - 15.5 %   Platelets 316 150 - 400 K/uL  Basic metabolic panel  Result Value Ref Range   Sodium 138 135 - 145 mmol/L   Potassium 3.6 3.5 - 5.1 mmol/L   Chloride 107 101 - 111 mmol/L   CO2 24 22 - 32 mmol/L   Glucose, Bld 89 65 - 99 mg/dL   BUN 6 6 - 20 mg/dL   Creatinine, Ser 0.59 0.44 - 1.00 mg/dL   Calcium 8.5 (L) 8.9 - 10.3 mg/dL   GFR calc non Af Amer >60 >60 mL/min   GFR calc Af Amer >60 >60 mL/min   Anion gap 7 5 - 15  Protime-INR  Result Value Ref Range   Prothrombin Time 17.0 (H) 11.4 - 15.2 seconds   INR 1.37   CBC  Result Value Ref Range   WBC 7.2 4.0 - 10.5 K/uL   RBC 4.07 3.87 - 5.11 MIL/uL   Hemoglobin 10.0 (L) 12.0 - 15.0 g/dL   HCT 33.6 (L) 36.0 -  46.0 %   MCV 82.6 78.0 - 100.0 fL   MCH 24.6 (L) 26.0 - 34.0 pg   MCHC 29.8 (L) 30.0 - 36.0 g/dL   RDW 17.8 (H) 11.5 - 15.5 %   Platelets 314 150 - 400 K/uL  Basic metabolic panel  Result Value Ref Range   Sodium 141 135 - 145 mmol/L   Potassium 3.9 3.5 - 5.1 mmol/L   Chloride 108 101 - 111 mmol/L   CO2 25 22 - 32 mmol/L   Glucose, Bld 102 (H) 65 - 99 mg/dL   BUN 9 6 - 20 mg/dL   Creatinine, Ser 0.65 0.44 - 1.00 mg/dL   Calcium 8.8 (L) 8.9 - 10.3 mg/dL   GFR calc non Af Amer >60 >60 mL/min   GFR calc Af Amer >60 >60 mL/min   Anion gap 8 5 - 15  Protime-INR  Result Value Ref Range   Prothrombin Time 18.9 (H) 11.4 - 15.2 seconds   INR [id number]   Basic metabolic panel  Result Value Ref Range   Sodium 140 135 - 145 mmol/L   Potassium 3.7 3.5 - 5.1 mmol/L   Chloride 110 101 - 111 mmol/L   CO2 22 22 - 32 mmol/L   Glucose, Bld 86 65 - 99 mg/dL   BUN 13 6 - 20 mg/dL   Creatinine, Ser 0.61 0.44 - 1.00 mg/dL   Calcium 8.7 (L) 8.9 - 10.3 mg/dL   GFR calc non Af Amer >60 >60 mL/min   GFR calc Af Amer >60 >60 mL/min   Anion gap 8 5 - 15  CBC  Result Value Ref Range   WBC 7.1 4.0 - 10.5 K/uL   RBC 4.16 3.87 - 5.11 MIL/uL   Hemoglobin 9.9 (L) 12.0 - 15.0 g/dL   HCT 34.8 (L) 36.0 - 46.0 %   MCV 83.7 78.0 - 100.0 fL   MCH 23.8 (L) 26.0 - 34.0 pg   MCHC 28.4 (L) 30.0 - 36.0 g/dL   RDW 18.3 (H) 11.5 - 15.5 %   Platelets 274 150 - 400 K/uL  Type and screen [facility]  Result Value Ref Range   ABO/RH(D) O NEG    Antibody Screen NEG    Sample Expiration 12/30/2015    Unit Number [id number]    Blood Component Type RED CELLS,LR    Unit division 00    Status of Unit REL FROM [facility]    Transfusion Status OK TO TRANSFUSE    Crossmatch Result Compatible    Unit Number [id number]    Blood Component Type RED CELLS,LR    Unit division 00    Status of Unit REL FROM [facility]    Transfusion Status OK TO TRANSFUSE    Crossmatch Result Compatible    Unit Number  [id number]    Blood Component Type RED CELLS,LR    Unit division 00    Status of Unit ISSUED,FINAL    Transfusion Status OK TO TRANSFUSE    Crossmatch Result Compatible    Unit Number [id number]    Blood Component Type RED CELLS,LR    Unit division 00    Status of Unit ISSUED,FINAL    Transfusion Status OK TO TRANSFUSE    Crossmatch Result Compatible   Prepare RBC  Result Value Ref Range   Order Confirmation ORDER PROCESSED BY BLOOD BANK   ABO/Rh  Result Value Ref Range   ABO/RH(D) O NEG       Assessment & Plan:   Problem  List Items Addressed This Visit    Upper GI bleed    Resolved. Check repeat CBC for follow-up Hgb Continue Protonix Remain off NSAIDs      Severe protein-calorie malnutrition (HCC)    Clinically consistent with >10% wt loss in 1 year, BMI is 18, and evidence of muscle wasting, including temporal region on exam. Low albumin 3.0. - Encourage regular diet, treat underlying depression - Recommend ensure or meal supplement - Future consider mirtazapine for appetite      Long term current use of anticoagulant therapy    Check PT/INR, last 1.5 (12/2015) with hospitalization, patient non adherent with follow-up or INR Refill current dose warfarin 5mg  Thurs/Sun, 2.5 mg half tab M-Tues-W-Fri-Sat, she has not stopped this dosing and not run out of med. - Discussion on DOAC agents, ultimately decision to defer today, with other med changes, and establish to discuss this with Neurology      Relevant Medications   warfarin (COUMADIN) 5 MG tablet   Other Relevant Orders   Ambulatory referral to Neurology   Protime-INR   HLD (hyperlipidemia)    Check fasting lipids, continue atorvastatin.      Relevant Medications   atorvastatin (LIPITOR) 10 MG tablet   warfarin (COUMADIN) 5 MG tablet   metoprolol tartrate (LOPRESSOR) 25 MG tablet   Other Relevant Orders   Lipid panel   GERD (gastroesophageal reflux disease)    GERD, complicated by gastritis and PUD,  on last EGD with acute UGI Refilled Protonix 40mg  daily Follow-up with GI in future if refractory symptoms, follow-up EGD and Colonoscopy      Relevant Medications   pantoprazole (PROTONIX) 40 MG tablet   Essential hypertension    Uncontrolled currently off of BB (atenolol due to back order and patient not requesting follow-up) also with tachycardia. Given complex stroke history suspect goal < 140/90. - Order requested Metoprolol for now, 25mg  BID, may need to change based on ins preference - Suspect will need additional medications if not at goal, consider ACEi      Relevant Medications   atorvastatin (LIPITOR) 10 MG tablet   warfarin (COUMADIN) 5 MG tablet   metoprolol tartrate (LOPRESSOR) 25 MG tablet   Other Relevant Orders   Hemoglobin A1c   Cerebrovascular accident (CVA) ([place])    Chronic problem with recurrent CVA over years, severe initial CVA >25 years ago with chronic residual L sided weakness, last CVA 11/2013. On chronic anticoagulation with warfarin since first CVA, but has been placed on added ASA 81mg  due to CVA on warfarin - Previously followed by [facility] Neurology last 2015 - Never established with Cardiology for AFib evaluation as possible source of recurrent CVA, also she was considered for possible neurosurgery eval  Plan: 1. Refilled Warfarin current dose for anticoag, continue ASA 81mg  daily 2. Check PT/INR, lipids 3. Continue statin 4. Counseled on smoking cessation 5. Referral back to [facility] Neurology for re-establish with complex CVA history, now with some worsening weakness, may need additional work-up including cardiology / neurosurg, appreciate any assistance in her stroke management, seems like she would be a good candidate for DOAC switch from warfarin, discussed this today but given other new med changes and complexity of her stroke history, I will defer this to Neurology, also will need to determine if ASA + DAOC is appropriate for her going  forward 6. Strict return criteria when to go to ED if worsening weakness or stroke like symptoms      Relevant Medications  atorvastatin (LIPITOR) 10 MG tablet   warfarin (COUMADIN) 5 MG tablet   metoprolol tartrate (LOPRESSOR) 25 MG tablet   Other Relevant Orders   Ambulatory referral to Neurology   Anxiety and depression - Primary    Significant anxiety with depression, seems uncontrolled with conflicting evidence on current med regimen. Screening scores PHQ/GAD are extremely high, and it is limiting her function. Poor adherence to medical follow-up. Son is primary caregiver seems to think she is improved on current Zoloft. - Remains off Lorazepam appropriately, she is not good candidate for BDZ - Prior failure of Lexapro  Plan: 1. Continue Zoloft 100mg  BID, may request refill as needed - discussed possible switch of SSRI if needed in future 2. Add Buspar 7.5mg  BID for now, instructions to titrate up by one dose of 7.5 q 1-2 weeks, max 15mg  BID, will need refills in future if working and can adjust dose 3. Start Hydroxyzine 10mg  BID PRN anxiety panic attacks only or nightly to help sleep, DISCONTINUE Tylenol PM 4. May need psych referral, seems prior referral to [facility] not successful placed back in 08/2015, may need therapy as well 5. Follow-up 4 weeks for anxiety/depression - adjust meds, consider Mirtazapine instead of buspar if needed for sleep, may help promote weight gain      Relevant Medications   busPIRone (BUSPAR) 7.5 MG tablet   hydrOXYzine (ATARAX/VISTARIL) 10 MG tablet   Anemia, iron deficiency    Check CBC, in setting of prior UGI, with chronic blood loss, on anticoagulation      Relevant Orders   CBC with Differential/Platelet    Other Visit Diagnoses    Needs flu shot       Relevant Orders   Flu vaccine HIGH DOSE PF (Completed)   Abnormal glucose       Relevant Orders   Hemoglobin A1c      Meds ordered this encounter  Medications  .               .                . atorvastatin (LIPITOR) 10 MG tablet    Sig: Take 1 tablet (10 mg total) by mouth daily.    Dispense:  90 tablet    Refill:  3  . pantoprazole (PROTONIX) 40 MG tablet    Sig: Take 1 tablet (40 mg total) by mouth daily.    Dispense:  90 tablet    Refill:  3  . busPIRone (BUSPAR) 7.5 MG tablet    Sig: Take 1 tablet (7.5 mg total) by mouth 2 (two) times daily.    Dispense:  60 tablet    Refill:  1  . hydrOXYzine (ATARAX/VISTARIL) 10 MG tablet    Sig: Take 1 tablet (10 mg total) by mouth 2 (two) times daily as needed for anxiety (sleep).    Dispense:  60 tablet    Refill:  2  . warfarin (COUMADIN) 5 MG tablet    Sig: Take 0.5-1 tablets (2.5-5 mg total) by mouth See admin instructions. As instructed 5mg  Thurs/Sat and half tab 2.5 M-Tues-W-Fri-Sat    Dispense:  30 tablet    Refill:  5  . metoprolol tartrate (LOPRESSOR) 25 MG tablet    Sig: Take 1 tablet (25 mg total) by mouth 2 (two) times daily.    Dispense:  180 tablet    Refill:  3      Follow up plan: Return in about 4 weeks (around 04/29/2016) for anxiety.  Nobie Putnam, [facility] 04/01/2016, 5:48 PM

## 2016-04-01 NOTE — Assessment & Plan Note (Signed)
Check PT/INR, last 1.5 (12/2015) with hospitalization, patient non adherent with follow-up or INR Refill current dose warfarin 5mg  Thurs/Sun, 2.5 mg half tab M-Tues-W-Fri-Sat, she has not stopped this dosing and not run out of med. - Discussion on DOAC agents, ultimately decision to defer today, with other med changes, and establish to discuss this with Neurology

## 2016-04-01 NOTE — Assessment & Plan Note (Signed)
Check CBC, in setting of prior UGI, with chronic blood loss, on anticoagulation

## 2016-04-01 NOTE — Patient Instructions (Signed)
Thank you for coming in to clinic today.  1. New BP med - Metoprolol 25mg  take one twice a day  2. Continue Warfarin, [facility]ill [facility], no change to dosing. - Ordered PT INR blood test and cholesterol, come back within 1 week fasting for LAB ONLY blood draw need appointment  3. Referal to [facility]  [facility] - Neurology Dept [address] [place], [place] [address] Phone: [phone] - Ask neurologist about: 1. Warfarin change to Xarelto 2. Need Aspirin? 3. Need any imaging, or neuro rehab therapy 4. Old history of seizure, and Keppra  4. For Anxiety, start new Buspar 7.5mg  TWICE a day, if after 2 weeks, not improved, you can increase to 15mg  (TWO tablets in morning) and 1 tablet 7.5mg  in evening, and after 2 more weeks you can double the dose to (TWO tablets) in morning and (TWO tablets in evening) - let me know if need new refill  Hydroxyzine 10mg  as needed up to twice a day ONLY for anxiety panic attack, and you can take one at night most nights to help sleep. STOP taking Tylenol PM.   If worsening weakness, numbness, or concern for stroke, go directly to hospital for evaluation.  If anxiety is not improving we will consider other meds or Psychiatry   Please schedule a follow-up appointment with Dr. Parks Ranger in 4 weeks for follow-up Anxiety / Depression  If you have any other questions or concerns, please feel free to call the clinic or send a message through [place]. You may also schedule an earlier appointment if necessary.  Nobie Putnam, DO [place]

## 2016-04-01 NOTE — Assessment & Plan Note (Signed)
Significant anxiety with depression, seems uncontrolled with conflicting evidence on current med regimen. Screening scores PHQ/GAD are extremely high, and it is limiting her function. Poor adherence to medical follow-up. Son is primary caregiver seems to think she is improved on current Zoloft. - Remains off Lorazepam appropriately, she is not good candidate for BDZ - Prior failure of Lexapro  Plan: 1. Continue Zoloft 100mg  BID, may request refill as needed - discussed possible switch of SSRI if needed in future 2. Add Buspar 7.5mg  BID for now, instructions to titrate up by one dose of 7.5 q 1-2 weeks, max 15mg  BID, will need refills in future if working and can adjust dose 3. Start Hydroxyzine 10mg  BID PRN anxiety panic attacks only or nightly to help sleep, DISCONTINUE Tylenol PM 4. May need psych referral, seems prior referral to [facility] not successful placed back in 08/2015, may need therapy as well 5. Follow-up 4 weeks for anxiety/depression - adjust meds, consider Mirtazapine instead of buspar if needed for sleep, may help promote weight gain

## 2016-04-01 NOTE — Assessment & Plan Note (Signed)
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 On chronic anticoagulation with warfarin since first CVA, but has been placed on added ASA 81mg  due to CVA on warfarin - Previously followed by [facility] Neurology last 2015 - Never established with Cardiology for AFib evaluation as possible source of recurrent CVA, also she was considered for possible neurosurgery eval  Plan: 1. Refilled Warfarin current dose for anticoag, continue ASA 81mg  daily 2. Check PT/INR, lipids 3. Continue statin 4. Counseled on smoking cessation 5. Referral back to [facility] Neurology for re-establish with complex CVA history, now with some worsening weakness, may need additional work-up including cardiology / neurosurg, appreciate any assistance in her stroke management, seems like she would be a good candidate for DOAC switch from warfarin, discussed this today but given other new med changes and complexity of her stroke history, I will defer this to Neurology, also will need to determine if ASA + DAOC is appropriate for her going forward 6. Strict return criteria when to go to ED if worsening weakness or stroke like symptoms

## 2016-04-01 NOTE — Assessment & Plan Note (Signed)
Check fasting lipids, continue atorvastatin.

## 2016-04-06 ENCOUNTER — Other Ambulatory Visit: Payer: Self-pay | Admitting: Family Medicine

## 2016-04-06 DIAGNOSIS — Z7901 Long term (current) use of anticoagulants: Secondary | ICD-10-CM

## 2016-04-06 DIAGNOSIS — I639 Cerebral infarction, unspecified: Secondary | ICD-10-CM

## 2016-04-06 MED ORDER — WARFARIN SODIUM 5 MG PO TABS: ORAL_TABLET | ORAL | 5 refills | Status: DC

## 2016-04-06 MED ORDER — WARFARIN SODIUM 5 MG PO TABS: ORAL_TABLET | ORAL | 3 refills | Status: DC

## 2016-04-06 MED ORDER — WARFARIN SODIUM 5 MG PO TABS: 2.5000 mg | ORAL_TABLET | ORAL | 5 refills | Status: DC

## 2016-04-29 ENCOUNTER — Ambulatory Visit: Payer: Medicare HMO | Admitting: Family Medicine

## 2016-07-01 ENCOUNTER — Ambulatory Visit: Payer: Medicare HMO | Admitting: Family Medicine

## 2016-07-15 ENCOUNTER — Encounter: Payer: Self-pay | Admitting: Family Medicine

## 2016-07-15 ENCOUNTER — Ambulatory Visit (INDEPENDENT_AMBULATORY_CARE_PROVIDER_SITE_OTHER): Payer: Medicare HMO | Admitting: Family Medicine

## 2016-07-15 ENCOUNTER — Other Ambulatory Visit: Payer: Self-pay | Admitting: Family Medicine

## 2016-07-15 VITALS — BP 144/74 | HR 92 | Temp 97.8°F | Resp 16 | Ht 66.0 in | Wt 116.0 lb

## 2016-07-15 DIAGNOSIS — J432 Centrilobular emphysema: Secondary | ICD-10-CM

## 2016-07-15 DIAGNOSIS — F418 Other specified anxiety disorders: Secondary | ICD-10-CM | POA: Diagnosis not present

## 2016-07-15 DIAGNOSIS — I10 Essential (primary) hypertension: Secondary | ICD-10-CM

## 2016-07-15 DIAGNOSIS — Z7901 Long term (current) use of anticoagulants: Secondary | ICD-10-CM | POA: Diagnosis not present

## 2016-07-15 DIAGNOSIS — E43 Unspecified severe protein-calorie malnutrition: Secondary | ICD-10-CM | POA: Diagnosis not present

## 2016-07-15 DIAGNOSIS — F32A Depression, unspecified: Secondary | ICD-10-CM

## 2016-07-15 DIAGNOSIS — I6930 Unspecified sequelae of cerebral infarction: Secondary | ICD-10-CM | POA: Diagnosis not present

## 2016-07-15 DIAGNOSIS — F419 Anxiety disorder, unspecified: Principal | ICD-10-CM

## 2016-07-15 DIAGNOSIS — R531 Weakness: Secondary | ICD-10-CM | POA: Diagnosis not present

## 2016-07-15 DIAGNOSIS — F329 Major depressive disorder, single episode, unspecified: Secondary | ICD-10-CM

## 2016-07-15 LAB — CBC WITH DIFFERENTIAL/PLATELET
BASOS PCT: 0 %
Basophils Absolute: 0 cells/uL (ref 0–200)
EOS ABS: 77 {cells}/uL (ref 15–500)
Eosinophils Relative: 1 %
HEMATOCRIT: 35.6 % (ref 35.0–45.0)
HEMOGLOBIN: 10 g/dL — AB (ref 11.7–15.5)
LYMPHS ABS: 1155 {cells}/uL (ref 850–3900)
Lymphocytes Relative: 15 %
MCH: 21.5 pg — ABNORMAL LOW (ref 27.0–33.0)
MCHC: 28.1 g/dL — ABNORMAL LOW (ref 32.0–36.0)
MCV: 76.6 fL — AB (ref 80.0–100.0)
MONO ABS: 385 {cells}/uL (ref 200–950)
MPV: 9.9 fL (ref 7.5–12.5)
Monocytes Relative: 5 %
Neutro Abs: 6083 cells/uL (ref 1500–7800)
Neutrophils Relative %: 79 %
Platelets: 367 10*3/uL (ref 140–400)
RBC: 4.65 MIL/uL (ref 3.80–5.10)
RDW: 19.3 % — ABNORMAL HIGH (ref 11.0–15.0)
WBC: 7.7 10*3/uL (ref 3.8–10.8)

## 2016-07-15 LAB — LIPID PANEL
CHOL/HDL RATIO: 2 ratio (ref <5.0)
CHOLESTEROL: 172 mg/dL (ref <200)
HDL: 87 mg/dL (ref >50)
LDL Cholesterol: 67 mg/dL (ref <100)
TRIGLYCERIDES: 88 mg/dL (ref <150)
VLDL: 18 mg/dL (ref <30)

## 2016-07-15 MED ORDER — HYDROXYZINE HCL 10 MG PO TABS: 10.0000 mg | ORAL_TABLET | Freq: Two times a day (BID) | ORAL | 5 refills | Status: DC | PRN

## 2016-07-15 MED ORDER — SERTRALINE HCL 100 MG PO TABS: 100.0000 mg | ORAL_TABLET | Freq: Two times a day (BID) | ORAL | 3 refills | Status: DC

## 2016-07-15 MED ORDER — BUSPIRONE HCL 7.5 MG PO TABS: 7.5000 mg | ORAL_TABLET | Freq: Two times a day (BID) | ORAL | 5 refills | Status: DC

## 2016-07-15 NOTE — Assessment & Plan Note (Signed)
Improved control, still slightly elevated BP, home BP similar, near goal < 140/90 - HR stable on BB, prior history of CAD/CVA  Plan: 1. Continue Metoprolol 25mg  BID for now - may adjust in future as needed 2. Check chemistry 3. Follow-up 3 months

## 2016-07-15 NOTE — Assessment & Plan Note (Signed)
Check PT/INR, last 1.5 (12/2015) with hospitalization, patient non adherent with follow-up or INR (now missed 2 months since last INR ordered) Continue current dose warfarin 5mg  Thurs/Sun, 2.5 mg half tab M-Tues-W-Fri-Sat, she has not stopped this dosing and not run out of med - Await Neurology evaluation to discuss DOAC agents

## 2016-07-15 NOTE — Assessment & Plan Note (Signed)
Stable, chronic problem without acute flare. Active smoker still, not ready to quit.  Plan: 1. Continues Spiriva, Advair, and Albuterol PRN 2. Smoking cessation counseling 3. Follow-up as needed

## 2016-07-15 NOTE — Patient Instructions (Addendum)
Thank you for coming in to clinic today.  1. No change to Blood Pressure meds, continue current course - Check BP occasionally, write down readings, let me know next time  2. Continue Warfarin, - we will contact you with dose change if needed  LABS TODAY  PT INR blood test, chemistry, cholesterol  3.  Referal to [facility]  [facility] - Neurology Dept [place], [place] [address] Phone: [phone] - Ask neurologist about: 1. Warfarin change to Xarelto 2. Need Aspirin? 3. Need any imaging, or neuro rehab therapy 4. Old history of seizure, and Keppra  4. For Anxiety,   REFILL that Buspar 7.5mg  TWICE a day, if after 2 weeks, not improved, you can increase to 15mg  (TWO tablets in morning) and 1 tablet 7.5mg  in evening, and after 2 more weeks you can double the dose to (TWO tablets) in morning and (TWO tablets in evening) - let me know if need new refill  Hydroxyzine refilled for anxiety  ----------------------------  If worsening weakness, numbness, or concern for stroke, go directly to hospital for evaluation.  If anxiety is not improving we will consider other meds or Psychiatry  Please schedule a follow-up appointment with Dr. Parks Ranger in 3 months follow-up  BP, Anxiety / Depression, GAD7/PHQ9  If you have any other questions or concerns, please feel free to call the clinic or send a message through [place]. You may also schedule an earlier appointment if necessary.  Nobie Putnam, DO [place]

## 2016-07-15 NOTE — Progress Notes (Signed)
Subjective:    Patient ID: Michele Matthews, female    DOB: 03-03-1946, 71 y.o.   MRN: [id number]  Michele Matthews is a 71 y.o. female presenting on 07/15/2016 for Anxiety (iproved after medication obtw as per pt feels numbness in Left arm onset 01/18) and Depression (not improving)  HPI  CHRONIC HTN: Reports home BP checks 130-140s/70-80s. Has history of CAD, was on prior BB Atenolol. Current Meds - Metoprolol 25mg  BID Reports good compliance, took meds today, tolerating well, w/o complaints. Denies CP, dyspnea, HA, edema, dizziness / lightheadedness  HYPERLIPIDEMIA: - Reports no concerns. Last lipid panel 10/2013, controlled on atorvastatin 10mg  daily, tolerating well without myalgias - Due for fasting lipids today  H/o Recurrent CVA with chronic residual L-sided weakness / Chronic Anticoagulation: - Last discussed 04/2016, see note for background info, briefly with initial CVA >25 years ago, multiple since, last CVA 2015, has been on Warfarin chronic anticoag for 25 years. - She was referred to [facility] Neurology to re-establish with Dr Melrose Nakayama (last seen 2015 after CVA), to discuss options with Xarelto or newer anticoags, and other stroke evaluation - Today reports due for INR test, she did not return in 4 weeks as advised last time - Taking Warfarin (5mg  Thurs/Sun, 2.5mg  half tab M-T-W-Fri-Sat), also taking ASA 81mg  daily was started more recently within past few years - Still actively smoking - Prior history of seizure x 1 due to acute sepsis and infection, was on keppra now off 4-6 months now, would ask Neurology about this as well - Currently describes recent worsening in weakness Left sided and Right hand numbness gradual over few weeks without resolution - Denies any bleeding concerns, dark stools  FOLLOW-UP Anxiety, Panic Attacks / Depression: - Chronic problem, see last office note 04/01/16 for background information, previously established with Psychiatry, last visit discussed  hazards of BDZ chronic treatment, she had been on Lorazepam 1-2 years from prior PCP, now remains off it and doing well. - Last visit rx Hydroxyzine PRN for anxiety/panic and insomnia, also started on Buspar. Patient seemed to do well on last med regimen, but admits to running out of Zoloft 100mg  BID (for past 2 weeks, awaiting mail order [employer] refill - Currently taking Hydroxyzine 10mg  nightly, and most of time during day as well with very good relief of anxiety/insomnia, out currently requesting refill - Out of Buspar for past 3-4 weeks, seems to have been helping - Patient did not follow-up as advised in 4 weeks, so ran out of meds - Denies suicidal or homicidal ideation, see GAD/PHQ scoring below, despite elevated scores, admits she does feel her anxiety is better controlled. Recent stressor with her son getting married, and she does "not like his wife" they are living at home.  Severe Protein Calorie malnutrition: - Chronic history of weight loss >1 year, with some poor appetite, worse with stress/depression - She has done better overall with some stress and anxiety - Now recent weight gain +4 lbs in 3 months - Denies abdominal pain, early satiety, nausea, vomiting, constipation/diarrhea   Social History  Substance Use Topics  . Smoking status: Current Every Day Smoker    Packs/day: 1.00  . Smokeless tobacco: Current User  . Alcohol use No    Review of Systems   Per HPI unless specifically indicated above  Depression screen [facility] 2/9 07/15/2016 04/01/2016 08/07/2015  Decreased Interest 1 1 2   Down, Depressed, Hopeless 3 2 3   PHQ - 2 Score 4 3 5   Altered  sleeping 3 3 0  Tired, decreased energy 3 3 3   Change in appetite 1 2 3   Feeling bad or failure about yourself  3 3 2   Trouble concentrating 3 1 0  Moving slowly or fidgety/restless 3 0 0  Suicidal thoughts 0 0 0  PHQ-9 Score 20 15 13   Difficult doing work/chores Somewhat difficult - -   GAD 7 : Generalized Anxiety Score  07/15/2016 04/01/2016  Nervous, Anxious, on Edge 3 3  Control/stop worrying 3 3  Worry too much - different things 3 3  Trouble relaxing 2 3  Restless 3 2  Easily annoyed or irritable 3 3  Afraid - awful might happen 2 3  Total GAD 7 Score 19 20  Anxiety Difficulty Somewhat difficult Very difficult        Objective:    BP (!) 144/74 (BP Location: Right Arm, Cuff Size: Normal)   Pulse 92   Temp 97.8 F (36.6 C) (Oral)   Resp 16   Ht 5\' 6"  (1.676 m)   Wt 116 lb (52.6 kg)   SpO2 100%   BMI 18.72 kg/m   Wt Readings from Last 3 Encounters:  07/15/16 116 lb (52.6 kg)  04/01/16 112 lb 9.6 oz (51.1 kg)  12/27/15 112 lb 10.5 oz (51.1 kg)    Physical Exam  Constitutional: She is oriented to person, place, and time. She appears well-developed and well-nourished. No distress.  Elderly 71 yr Female older than stated age, chronically ill appearing, currently comfortable, cooperative  Seated in wheelchair  HENT:  Head: Normocephalic and atraumatic.  Mouth/Throat: Oropharynx is clear and moist.  Difficulty hearing conversational speech.  Oropharynx clear without erythema, exudates, edema or asymmetry.  Poor dentition.  Eyes: EOM are normal.  Cardiovascular: Normal rate, regular rhythm, normal heart sounds and intact distal pulses.   No murmur heard. Pulmonary/Chest: Effort normal and breath sounds normal. No respiratory distress. She has no wheezes. She has no rales.  Slightly reduced breath sounds.  Musculoskeletal: She exhibits no edema.  Stable, unchanged significant muscle wasting and atrophy bilateral upper and lower extremities.  Facial muscle atrophy and wasting on bilateral temples, reduced skin turgor  Neurological: She is alert and oriented to person, place, and time. No cranial nerve deficit.  Distal sensation to light touch reduced R hand.  Musclar strength 4/5 bilateral grip and knee flex/ext, ankle/dorsiflex, Left with some difficulty moving during exam with  chronic residual stiffness.  No tremors at baseline  Skin: Skin is warm and dry. No rash noted. She is not diaphoretic.  Psychiatric: Her behavior is normal.  Good eye contact, difficulty following conversation due to poor hearing, appropriate speech and thoughts, behavior normal. Clinically mood seems improved today. Does not appear anxious.  Nursing note and vitals reviewed.      Assessment & Plan:   Problem List Items Addressed This Visit    Severe protein-calorie malnutrition ([place])    Improved wt gain 4 lbs in 3 months. Still clinically consistent with >10% wt loss in 1 year, BMI is 18, and evidence of muscle wasting, including temporal region on exam. Low albumin 3.0 in past.  - Encourage regular diet, treat underlying depression - Recommend ensure or meal supplement - Future consider mirtazapine for appetite      Long term current use of anticoagulant therapy    Check PT/INR, last 1.5 (12/2015) with hospitalization, patient non adherent with follow-up or INR (now missed 2 months since last INR ordered) Continue current dose warfarin 5mg   Thurs/Sun, 2.5 mg half tab M-Tues-W-Fri-Sat, she has not stopped this dosing and not run out of med - Await Neurology evaluation to discuss [place] agents      Left-sided weakness    Stable, chronic left sided weakness, old deficits last CVA 2015 Previously followed by [facility] Neurology, last 2015 On ASA, Warfarin (not compliant with INR checks, now 2+ months late) - Referred to [facility] Neuro back in 04/2016, patient missed appointment, now this was re-scheduled today, new apt given to patient for April 2018, re-establish, discuss chronic CVA sequela, may need future neuro rehab, vs change warfarin to alternative DOAC agent      History of CVA with residual deficit    Stable without recurrence. Chronic problem with recurrent CVA over years - Chronic residual L sided weakness, last CVA 11/2013. - On ASA 81, Warfarin (25 years) - Previously followed by  [facility] Neurology last 2015 - Never established with Cardiology for AFib evaluation as possible source of recurrent CVA, also she was considered for possible neurosurgery eval  Plan: 1. Continue current ASA, Warfarin dosing - may adjust warfarin dose as needed 2. Check PT INR today - note patient missed last INR check 2 months ago, previously not returning for regular INR checks cause for concern 3. Continue statin, check lipids 4. Counseled on smoking cessation 5. Missed [facility] apt in 05/2016, now re-scheduled today in office, there was confusion on the apt date last time per patient. Now scheduled for 07/2016 - for re-establish with complex CVA history, now with some worsening weakness, may need additional work-up including cardiology / neurosurg, appreciate any assistance in her stroke management, seems like she would be a good candidate for DOAC switch from warfarin, discussed this today but given other new med changes and complexity of her stroke history, I will defer this to Neurology, also will need to determine if ASA + DAOC is appropriate for her going forward 6. Strict return criteria when to go to ED if worsening weakness or stroke like symptoms      Essential hypertension    Improved control, still slightly elevated BP, home BP similar, near goal < 140/90 - HR stable on BB, prior history of CAD/CVA  Plan: 1. Continue Metoprolol 25mg  BID for now - may adjust in future as needed 2. Check chemistry 3. Follow-up 3 months      COPD (chronic obstructive pulmonary disease) (HCC)    Stable, chronic problem without acute flare. Active smoker still, not ready to quit.  Plan: 1. Continues Spiriva, Advair, and Albuterol PRN 2. Smoking cessation counseling 3. Follow-up as needed      Anxiety and depression - Primary    Significant anxiety with depression chronically, worse post CVA in past and with age, other life stressors. Clinically seems some moderate improvement on  prior med regimen, however patient ran out of both Zoloft and Buspar since she did not schedule follow-up as advised in 4 weeks, did not notify for refill rx sooner. - PHQ/GAD scores remain very high, despite clinically reported improved - Remains off BDZ - Prior failure of Lexapro  Plan: 1. Continue Zoloft 100mg  BID (**Re-start dosing at 1 tab daily for 1-2 weeks then up to 1 tab BID** since she was out for 2 weeks) - refill from [place] was sent to her old PCP Dr Hall Busing, and this was denied, and we did not receive this request until later, it was scheduled to ship today from their pharmacy, I initially sent one  new e-script, but contacted [employer], and do not need additional refill order 2. Refill Buspar 7.5mg  BID - she had run out of this as well - seemed to help, titration instructions again reviewed up to max 15mg  BID 3. Refill Hydroxyzine 10mg  BID PRN anxiety / panic / insomnia - seems to take regularly, refilled local pharmacy - future may actually be able to send 90 day supply to [facility] if taking regularly 4. Future follow-up consider Psychiatry if not improving, consider mirtazapine for sleep as well and appetite /wt 5. Follow-up 3 months, some difficulties with returning for frequent visits      Relevant Medications   sertraline (ZOLOFT) 100 MG tablet   hydrOXYzine (ATARAX/VISTARIL) 10 MG tablet   busPIRone (BUSPAR) 7.5 MG tablet     Meds ordered this encounter  Medications  . sertraline (ZOLOFT) 100 MG tablet    Sig: Take 1 tablet (100 mg total) by mouth 2 (two) times daily.    Dispense:  180 tablet    Refill:  3  .               . hydrOXYzine (ATARAX/VISTARIL) 10 MG tablet    Sig: Take 1 tablet (10 mg total) by mouth 2 (two) times daily as needed for anxiety (sleep).    Dispense:  60 tablet    Refill:  5  . busPIRone (BUSPAR) 7.5 MG tablet    Sig: Take 1 tablet (7.5 mg total) by mouth 2 (two) times daily.    Dispense:  60 tablet    Refill:  5    Follow up  plan: Return in about 3 months (around 10/15/2016) for BP , Anxiety / Depression, GAD7/PHQ9.  Nobie Putnam, [facility] 07/15/2016, 12:30 PM

## 2016-07-15 NOTE — Assessment & Plan Note (Addendum)
Stable without recurrence. Chronic problem with recurrent CVA over years - Chronic residual L sided weakness, last CVA 11/2013. - On ASA 81, Warfarin (25 years) - Previously followed by [facility] Neurology last 2015 - Never established with Cardiology for AFib evaluation as possible source of recurrent CVA, also she was considered for possible neurosurgery eval  Plan: 1. Continue current ASA, Warfarin dosing - may adjust warfarin dose as needed 2. Check PT INR today - note patient missed last INR check 2 months ago, previously not returning for regular INR checks cause for concern 3. Continue statin, check lipids 4. Counseled on smoking cessation 5. Missed [facility] apt in 05/2016, now re-scheduled today in office, there was confusion on the apt date last time per patient. Now scheduled for 07/2016 - for re-establish with complex CVA history, now with some worsening weakness, may need additional work-up including cardiology / neurosurg, appreciate any assistance in her stroke management, seems like she would be a good candidate for DOAC switch from warfarin, discussed this today but given other new med changes and complexity of her stroke history, I will defer this to Neurology, also will need to determine if ASA + DAOC is appropriate for her going forward 6. Strict return criteria when to go to ED if worsening weakness or stroke like symptoms

## 2016-07-15 NOTE — Assessment & Plan Note (Addendum)
Significant anxiety with depression chronically, worse post CVA in past and with age, other life stressors. Clinically seems some moderate improvement on prior med regimen, however patient ran out of both Zoloft and Buspar since she did not schedule follow-up as advised in 4 weeks, did not notify for refill rx sooner. - PHQ/GAD scores remain very high, despite clinically reported improved - Remains off BDZ - Prior failure of Lexapro  Plan: 1. Continue Zoloft 100mg  BID (**Re-start dosing at 1 tab daily for 1-2 weeks then up to 1 tab BID** since she was out for 2 weeks) - refill from [place] was sent to her old PCP Dr Hall Busing, and this was denied, and we did not receive this request until later, it was scheduled to ship today from their pharmacy, I initially sent one new e-script, but contacted [employer], and do not need additional refill order 2. Refill Buspar 7.5mg  BID - she had run out of this as well - seemed to help, titration instructions again reviewed up to max 15mg  BID 3. Refill Hydroxyzine 10mg  BID PRN anxiety / panic / insomnia - seems to take regularly, refilled local pharmacy - future may actually be able to send 90 day supply to [facility] if taking regularly 4. Future follow-up consider Psychiatry if not improving, consider mirtazapine for sleep as well and appetite /wt 5. Follow-up 3 months, some difficulties with returning for frequent visits

## 2016-07-15 NOTE — Assessment & Plan Note (Signed)
Improved wt gain 4 lbs in 3 months. Still clinically consistent with >10% wt loss in 1 year, BMI is 18, and evidence of muscle wasting, including temporal region on exam. Low albumin 3.0 in past.  - Encourage regular diet, treat underlying depression - Recommend ensure or meal supplement - Future consider mirtazapine for appetite

## 2016-07-15 NOTE — Assessment & Plan Note (Signed)
Stable, chronic left sided weakness, old deficits last CVA 2015 Previously followed by [facility] Neurology, last 2015 On ASA, Warfarin (not compliant with INR checks, now 2+ months late) - Referred to [facility] Neuro back in 04/2016, patient missed appointment, now this was re-scheduled today, new apt given to patient for April 2018, re-establish, discuss chronic CVA sequela, may need future neuro rehab, vs change warfarin to alternative DOAC agent

## 2016-07-16 LAB — PROTIME-INR
INR: 1.9 — AB
Prothrombin Time: 19.3 s — ABNORMAL HIGH (ref 9.0–11.5)

## 2016-07-16 LAB — HEMOGLOBIN A1C
Hgb A1c MFr Bld: 4.4 % (ref <5.7)
MEAN PLASMA GLUCOSE: 80 mg/dL

## 2016-07-18 NOTE — Addendum Note (Signed)
Addended by: Olin Hauser on: 07/18/2016 12:39 PM   Modules accepted: Orders

## 2016-10-19 ENCOUNTER — Ambulatory Visit: Payer: Medicare HMO | Admitting: Family Medicine

## 2016-10-21 ENCOUNTER — Ambulatory Visit: Payer: Medicare HMO | Admitting: Family Medicine

## 2016-10-28 ENCOUNTER — Ambulatory Visit: Payer: Medicare HMO | Admitting: Family Medicine

## 2016-11-03 ENCOUNTER — Ambulatory Visit (INDEPENDENT_AMBULATORY_CARE_PROVIDER_SITE_OTHER): Payer: Medicare HMO | Admitting: Family Medicine

## 2016-11-03 ENCOUNTER — Encounter: Payer: Self-pay | Admitting: Family Medicine

## 2016-11-03 VITALS — BP 151/66 | HR 75 | Temp 98.3°F | Resp 16 | Ht 66.0 in | Wt 121.0 lb

## 2016-11-03 DIAGNOSIS — I10 Essential (primary) hypertension: Secondary | ICD-10-CM | POA: Diagnosis not present

## 2016-11-03 DIAGNOSIS — Z7901 Long term (current) use of anticoagulants: Secondary | ICD-10-CM | POA: Diagnosis not present

## 2016-11-03 DIAGNOSIS — F329 Major depressive disorder, single episode, unspecified: Secondary | ICD-10-CM

## 2016-11-03 DIAGNOSIS — L308 Other specified dermatitis: Secondary | ICD-10-CM | POA: Diagnosis not present

## 2016-11-03 DIAGNOSIS — I6930 Unspecified sequelae of cerebral infarction: Secondary | ICD-10-CM

## 2016-11-03 DIAGNOSIS — R569 Unspecified convulsions: Secondary | ICD-10-CM

## 2016-11-03 DIAGNOSIS — E43 Unspecified severe protein-calorie malnutrition: Secondary | ICD-10-CM | POA: Diagnosis not present

## 2016-11-03 DIAGNOSIS — F32A Depression, unspecified: Secondary | ICD-10-CM

## 2016-11-03 DIAGNOSIS — F419 Anxiety disorder, unspecified: Secondary | ICD-10-CM

## 2016-11-03 MED ORDER — BUSPIRONE HCL 7.5 MG PO TABS: 7.5000 mg | ORAL_TABLET | Freq: Two times a day (BID) | ORAL | 3 refills | Status: AC

## 2016-11-03 MED ORDER — HYDROXYZINE HCL 10 MG PO TABS: 10.0000 mg | ORAL_TABLET | Freq: Two times a day (BID) | ORAL | 11 refills | Status: AC | PRN

## 2016-11-03 MED ORDER — TRIAMCINOLONE ACETONIDE 0.5 % EX CREA: 1.0000 "application " | TOPICAL_CREAM | Freq: Two times a day (BID) | CUTANEOUS | 2 refills | Status: DC

## 2016-11-03 NOTE — Assessment & Plan Note (Signed)
Stable without recurrence. Chronic problem with recurrent CVA over years - Chronic residual L sided weakness, last CVA 11/2013. - On ASA 81, Warfarin (25 years) - Previously followed by [facility] last 2015 - Never established with Cardiology for AFib evaluation as possible source of recurrent CVA, also she was considered for possible neurosurgery eval  Plan: 1. Continue current ASA, Warfarin dosing - may adjust warfarin dose as needed - Discussion today ideally could switch to DOAC (Xarelto vs Eliquis) to ask insurance and find cost/coverage, notify office if may be able to change 2. Check PT INR today - note patient still has been non adherent to returning for INR checks, especially since last visit did not return to Neuro 3. Continue statin 4. Counseled on smoking cessation 5. Strict return criteria when to go to ED if worsening weakness or stroke like symptoms

## 2016-11-03 NOTE — Progress Notes (Signed)
Subjective:    Patient ID: Michele Matthews, female    DOB: 10-23-1945, 71 y.o.   MRN: [id number]  Michele Matthews is a 71 y.o. female presenting on 11/03/2016 for Hypertension  Patient is accompanied by her son. She provides most of history. Limited due to hearing.  HPI   CHRONIC HTN: Reports home BP checks - avg 140s/70s, occasional 150s but more rare. - Past history of CAD, was on prior BB Atenolol. Current Meds - Metoprolol 25mg  BID Reports good compliance, took meds today, tolerating well, w/o complaints. 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 - Interval update with never saw Neurology due to scheduling conflict and due to difficulty of not being able to see her, patient has then declined to go since doing well, and thinks it is "no longer needed" - Today patient reports no concerns, continues to takes Aspirin and Warfarin, but has not returned to me for INR monitoring - Still admits L sided upper extremity mostly finger slight weakness - No further problems with seizure, this seemed to be only related to acute illness and sepsis - Denies any bleeding episodes, new concerns of neurological problems or stroke, weakness, numbness tingling  FOLLOW-UP Anxiety, Panic Attacks / Depression: - Last visit with me 07/15/16, for same problem, without significant changes except refills on meds had run out and difficulty taking meds, see prior notes for background information. - Today patient reports dramatic improvement overall in her anxiety and depression. Has had no further panic attacks since medications adjusted - Continues to take  Zoloft 100mg  BID, Buspar 7.5mg  BID, and Hydroxyzine 10mg  BID (PRN but usually takes, does not have sedation or confusion). Remains off Lorazepam. - Denies suicidal or homicidal ideation, see GAD/PHQ scoring below  Severe Protein Calorie malnutrition: - Chronic problem >1 year with weight loss, initially related more to stress/anxiety/depression - Today doing better, +5 lb wt gain in 4 months - Appetite improved on medicines - Denies abdominal pain, early satiety, nausea, vomiting, constipation/diarrhea   Social History  Substance Use Topics  . Smoking status: Current Every Day Smoker    Packs/day: 1.00  . Smokeless tobacco: Current User  . Alcohol use No    Review of Systems Per HPI unless specifically indicated above  Depression screen [facility] 2/9 11/03/2016 07/15/2016 04/01/2016  Decreased Interest 0 1 1  Down, Depressed, Hopeless 0 3 2  PHQ - 2 Score 0 4 3  Altered sleeping 0 3 3  Tired, decreased energy 1 3 3   Change in appetite 0 1 2  Feeling bad or failure about yourself  0 3 3  Trouble concentrating 0 3 1  Moving slowly or fidgety/restless 1 3 0  Suicidal thoughts 0 0 0  PHQ-9 Score 2 20 15   Difficult doing work/chores - Somewhat difficult -   GAD 7 : Generalized Anxiety Score 11/03/2016 07/15/2016 04/01/2016  Nervous, Anxious, on Edge 0 3 3  Control/stop worrying 1 3 3   Worry too much - different things 1 3 3   Trouble relaxing 0 2 3  Restless 0 3 2  Easily annoyed or irritable 1 3 3   Afraid - awful might happen  0 2 3  Total GAD 7 Score 3 19 20   Anxiety Difficulty Not difficult at all Somewhat difficult Very difficult        Objective:    BP (!) 151/66   Pulse 75   Temp 98.3 F (36.8 C) (Oral)   Resp 16   Ht 5\' 6"  (1.676 m)   Wt 121 lb (54.9 kg)   BMI 19.53 kg/m   Wt Readings from Last 3 Encounters:  11/03/16 121 lb (54.9 kg)  07/15/16 116 lb (52.6 kg)  04/01/16 112 lb 9.6 oz (51.1 kg)    Physical Exam  Constitutional: She is oriented to person, place, and  time. She appears well-developed and well-nourished. No distress.  Elderly 71 yr Female older than stated age, chronically ill appearing, currently comfortable, cooperative  Seated in wheelchair  HENT:  Head: Normocephalic and atraumatic.  Mouth/Throat: Oropharynx is clear and moist.  Difficulty hearing conversational speech.  Oropharynx clear without erythema, exudates, edema or asymmetry.  Poor dentition.  Eyes: EOM are normal.  Cardiovascular: Normal rate, regular rhythm, normal heart sounds and intact distal pulses.   No murmur heard. Pulmonary/Chest: Effort normal and breath sounds normal. No respiratory distress. She has no wheezes. She has no rales.  Slightly reduced breath sounds.  Musculoskeletal: She exhibits no edema.  Stable, unchanged significant muscle wasting and atrophy bilateral upper and lower extremities.  Facial muscle atrophy and wasting on bilateral temples, reduced skin turgor  Neurological: She is alert and oriented to person, place, and time. No cranial nerve deficit.  Distal sensation to light touch reduced R hand.  No tremors at baseline  Skin: Skin is warm and dry. Rash (dry patches of skin with some excoriation from scratching right neck and upper back) noted. She is not diaphoretic.  Psychiatric: She has a normal mood and affect. Her behavior is normal.  Good eye contact, difficulty following conversation due to poor hearing, appropriate speech and thoughts, behavior normal. Optimistic and positive mood seems much more animated, does not appear anxious.  Nursing note and vitals reviewed.      Assessment & Plan:   Problem List Items Addressed This Visit    Severe protein-calorie malnutrition ([place])    Improved wt gain +5 lbs in 4 months, BMI >19 Continue improved nutrition, likely in setting of improved mood      RESOLVED: Seizure ([place])    Stable without recurrent seizure, remains off Keppra Did not re-establish with [facility] May consider this  problem resolved for now, will re-visit Neurology again if patient is interested, at moment she has declined      Long term current use of anticoagulant therapy    Check INR today, continue warfarin, dose adjust as needed Advised may consider Xarelto vs Eliquis through insurance if able to afford / coverage, to limit INR checks  Emphasized needs to return q 4 weeks for routine INR checks unless otherwise advised      History of CVA with residual deficit    Stable without recurrence. Chronic problem with recurrent CVA over years - Chronic residual L sided weakness, last CVA 11/2013. - On ASA 81, Warfarin (25 years) - Previously followed by [facility] last 2015 - Never established with Cardiology for AFib evaluation as possible source of recurrent CVA, also she was considered for possible neurosurgery eval  Plan: 1. Continue current ASA, Warfarin dosing - may adjust warfarin dose as needed - Discussion today ideally could switch to DOAC (Xarelto vs Eliquis) to ask  insurance and find cost/coverage, notify office if may be able to change 2. Check PT INR today - note patient still has been non adherent to returning for INR checks, especially since last visit did not return to Neuro 3. Continue statin 4. Counseled on smoking cessation 5. Strict return criteria when to go to ED if worsening weakness or stroke like symptoms      Essential hypertension    Mildly elevated initial BP, overall near goal < 150/90 - Home BP readings improved [id number]  Complication history of CVA, CAD   Plan:  1. Continue current BP regimen Metoprolol 25mg  BID 2. Encourage improved lifestyle - low sodium diet, improve activity, limited in exercise 3.  Continue monitor BP outside office, bring readings to next visit, if persistently >140/90 or new symptoms notify office sooner 4. Follow-up q 4 months      Anxiety and depression - Primary    Dramatic improvement in anxiety with depression, now  seems to be in remission. No further panic attacks. - Secondary to underlying CVA, among other strssors - PHQ/GAD scores improved - Remains off BDZ - Prior failure of Lexapro  Plan: 1. Continue Zoloft 100mg  BID 2. Refill Buspar 7.5mg  BID through [facility] mail order 3. Refill Hydroxyzine 10mg  BID PRN anxiety / panic / insomnia through [employer], beneficial for patient, caution on sedation risks 4. Future follow-up consider Psychiatry if not improving, consider mirtazapine for sleep as well and appetite /wt 5. Follow-up 4 months, due to some difficulties with returning for frequent visits      Relevant Medications   busPIRone (BUSPAR) 7.5 MG tablet   hydrOXYzine (ATARAX/VISTARIL) 10 MG tablet    Other Visit Diagnoses    Other eczema       Upper back, shoulder, clinically consistent, sent rx Triamcinlone   Relevant Medications   triamcinolone cream (KENALOG) 0.5 %     Meds ordered this encounter  Medications  . IBUPROFEN 100 JUNIOR STRENGTH 100 MG chewable tablet    Sig: 200 mg.  . busPIRone (BUSPAR) 7.5 MG tablet    Sig: Take 1 tablet (7.5 mg total) by mouth 2 (two) times daily.    Dispense:  180 tablet    Refill:  3  . hydrOXYzine (ATARAX/VISTARIL) 10 MG tablet    Sig: Take 1 tablet (10 mg total) by mouth 2 (two) times daily as needed for anxiety (sleep).    Dispense:  60 tablet    Refill:  11    Keep refills on file  . triamcinolone cream (KENALOG) 0.5 %    Sig: Apply 1 application topically 2 (two) times daily. To affected areas, for up to 2 weeks.    Dispense:  30 g    Refill:  2    Follow up plan: Return in about 4 months (around 03/06/2017) for HTN, Anticoagulation, Anxiety/Depression PHQ / GAD.  Nobie Putnam, [facility] 11/03/2016, 3:45 PM

## 2016-11-03 NOTE — Assessment & Plan Note (Signed)
Stable without recurrent seizure, remains off Keppra Did not re-establish with [facility] May consider this problem resolved for now, will re-visit Neurology again if patient is interested, at moment she has declined

## 2016-11-03 NOTE — Assessment & Plan Note (Signed)
Dramatic improvement in anxiety with depression, now seems to be in remission. No further panic attacks. - Secondary to underlying CVA, among other strssors - PHQ/GAD scores improved - Remains off BDZ - Prior failure of Lexapro  Plan: 1. Continue Zoloft 100mg  BID 2. Refill Buspar 7.5mg  BID through [facility] mail order 3. Refill Hydroxyzine 10mg  BID PRN anxiety / panic / insomnia through [employer], beneficial for patient, caution on sedation risks 4. Future follow-up consider Psychiatry if not improving, consider mirtazapine for sleep as well and appetite /wt 5. Follow-up 4 months, due to some difficulties with returning for frequent visits

## 2016-11-03 NOTE — Patient Instructions (Addendum)
Thank you for coming to the clinic today.  1. We need to be checking Warfarin / Coumadin level INR every 4 weeks at least, unless it is off then we need to check it sooner.  Check with [employer] insurance to find out cost / coverage for Xarelto (Rivaroxaban) (due to history of stroke), also can ask about Eliquis (Apixaban) - If you think this would be affordable and covered, we can send a new rx in and STOP Coumadin, we will talk you through this process if ready to make the change - You do not need to check blood tests while on Xarelto or Eliquis  2. Continue meds for anxiety/mood - Refilled Hydroxyzine to [place] to [facility] for 90 day supply and refills  3. Keep checking BP regularly at home, write down readings, if persistent >150 then notify office sooner and we can adjust medications, maybe add another low dose med  Please schedule a Follow-up Appointment to: Return in about 4 months (around 03/06/2017) for HTN, Anticoagulation, Anxiety/Depression PHQ / GAD.  If you have any other questions or concerns, please feel free to call the clinic or send a message through [place]. You may also schedule an earlier appointment if necessary.  Additionally, you may be receiving a survey about your experience at our clinic within a few days to 1 week by e-mail or mail. We value your feedback.  Nobie Putnam, DO [place]

## 2016-11-03 NOTE — Assessment & Plan Note (Signed)
Check INR today, continue warfarin, dose adjust as needed Advised may consider Xarelto vs Eliquis through insurance if able to afford / coverage, to limit INR checks  Emphasized needs to return q 4 weeks for routine INR checks unless otherwise advised

## 2016-11-03 NOTE — Assessment & Plan Note (Signed)
Improved wt gain +5 lbs in 4 months, BMI >19 Continue improved nutrition, likely in setting of improved mood

## 2016-11-03 NOTE — Assessment & Plan Note (Signed)
Mildly elevated initial BP, overall near goal < 150/90 - Home BP readings improved 975-300F  Complication history of CVA, CAD   Plan:  1. Continue current BP regimen Metoprolol 25mg  BID 2. Encourage improved lifestyle - low sodium diet, improve activity, limited in exercise 3.  Continue monitor BP outside office, bring readings to next visit, if persistently >140/90 or new symptoms notify office sooner 4. Follow-up q 4 months

## 2016-11-04 LAB — PROTIME-INR
INR: 1.4 — AB
Prothrombin Time: 14.5 s — ABNORMAL HIGH (ref 9.0–11.5)

## 2017-02-02 ENCOUNTER — Other Ambulatory Visit: Payer: Self-pay | Admitting: Family Medicine

## 2017-02-02 DIAGNOSIS — I10 Essential (primary) hypertension: Secondary | ICD-10-CM

## 2017-03-10 ENCOUNTER — Ambulatory Visit: Payer: Medicare HMO | Admitting: Family Medicine

## 2017-03-30 ENCOUNTER — Other Ambulatory Visit: Payer: Self-pay | Admitting: Family Medicine

## 2017-03-30 DIAGNOSIS — I10 Essential (primary) hypertension: Secondary | ICD-10-CM

## 2017-03-30 DIAGNOSIS — K219 Gastro-esophageal reflux disease without esophagitis: Secondary | ICD-10-CM

## 2017-03-30 DIAGNOSIS — Z7901 Long term (current) use of anticoagulants: Secondary | ICD-10-CM

## 2017-03-30 DIAGNOSIS — I639 Cerebral infarction, unspecified: Secondary | ICD-10-CM

## 2017-04-07 ENCOUNTER — Ambulatory Visit: Payer: Medicare HMO | Admitting: Family Medicine

## 2017-04-21 ENCOUNTER — Ambulatory Visit: Payer: Medicare HMO | Admitting: Family Medicine

## 2017-04-28 ENCOUNTER — Encounter: Payer: Self-pay | Admitting: Emergency Medicine

## 2017-04-28 ENCOUNTER — Emergency Department: Payer: Medicare HMO

## 2017-04-28 ENCOUNTER — Other Ambulatory Visit: Payer: Self-pay

## 2017-04-28 ENCOUNTER — Emergency Department
Admission: EM | Admit: 2017-04-28 | Discharge: 2017-04-28 | Disposition: A | Discharge status: Home / Self Care | Payer: Medicare HMO | Attending: Emergency Medicine | Admitting: Emergency Medicine

## 2017-04-28 DIAGNOSIS — Z8673 Personal history of transient ischemic attack (TIA), and cerebral infarction without residual deficits: Secondary | ICD-10-CM | POA: Insufficient documentation

## 2017-04-28 DIAGNOSIS — F17220 Nicotine dependence, chewing tobacco, uncomplicated: Secondary | ICD-10-CM | POA: Diagnosis not present

## 2017-04-28 DIAGNOSIS — Y999 Unspecified external cause status: Secondary | ICD-10-CM | POA: Insufficient documentation

## 2017-04-28 DIAGNOSIS — R51 Headache: Secondary | ICD-10-CM | POA: Diagnosis not present

## 2017-04-28 DIAGNOSIS — M25552 Pain in left hip: Secondary | ICD-10-CM | POA: Insufficient documentation

## 2017-04-28 DIAGNOSIS — N390 Urinary tract infection, site not specified: Secondary | ICD-10-CM | POA: Diagnosis not present

## 2017-04-28 DIAGNOSIS — Z7982 Long term (current) use of aspirin: Secondary | ICD-10-CM | POA: Insufficient documentation

## 2017-04-28 DIAGNOSIS — S34109A Unspecified injury to unspecified level of lumbar spinal cord, initial encounter: Secondary | ICD-10-CM | POA: Diagnosis present

## 2017-04-28 DIAGNOSIS — Z7901 Long term (current) use of anticoagulants: Secondary | ICD-10-CM | POA: Insufficient documentation

## 2017-04-28 DIAGNOSIS — J449 Chronic obstructive pulmonary disease, unspecified: Secondary | ICD-10-CM | POA: Insufficient documentation

## 2017-04-28 DIAGNOSIS — I2510 Atherosclerotic heart disease of native coronary artery without angina pectoris: Secondary | ICD-10-CM | POA: Insufficient documentation

## 2017-04-28 DIAGNOSIS — Y929 Unspecified place or not applicable: Secondary | ICD-10-CM | POA: Diagnosis not present

## 2017-04-28 DIAGNOSIS — Y939 Activity, unspecified: Secondary | ICD-10-CM | POA: Insufficient documentation

## 2017-04-28 DIAGNOSIS — Z79899 Other long term (current) drug therapy: Secondary | ICD-10-CM | POA: Insufficient documentation

## 2017-04-28 DIAGNOSIS — M25551 Pain in right hip: Secondary | ICD-10-CM | POA: Diagnosis not present

## 2017-04-28 DIAGNOSIS — I10 Essential (primary) hypertension: Secondary | ICD-10-CM | POA: Insufficient documentation

## 2017-04-28 DIAGNOSIS — W010XXA Fall on same level from slipping, tripping and stumbling without subsequent striking against object, initial encounter: Secondary | ICD-10-CM | POA: Diagnosis not present

## 2017-04-28 DIAGNOSIS — S32020A Wedge compression fracture of second lumbar vertebra, initial encounter for closed fracture: Secondary | ICD-10-CM | POA: Diagnosis not present

## 2017-04-28 DIAGNOSIS — F17200 Nicotine dependence, unspecified, uncomplicated: Secondary | ICD-10-CM | POA: Diagnosis not present

## 2017-04-28 LAB — COMPREHENSIVE METABOLIC PANEL
ALBUMIN: 3.9 g/dL (ref 3.5–5.0)
ALT: 19 U/L (ref 14–54)
AST: 34 U/L (ref 15–41)
Alkaline Phosphatase: 68 U/L (ref 38–126)
Anion gap: 9 (ref 5–15)
BUN: 17 mg/dL (ref 6–20)
CHLORIDE: 107 mmol/L (ref 101–111)
CO2: 23 mmol/L (ref 22–32)
CREATININE: 0.66 mg/dL (ref 0.44–1.00)
Calcium: 9.3 mg/dL (ref 8.9–10.3)
GFR calc Af Amer: >60 mL/min (ref >60)
GFR calc non Af Amer: >60 mL/min (ref >60)
GLUCOSE: 103 mg/dL — AB (ref 65–99)
POTASSIUM: 3.7 mmol/L (ref 3.5–5.1)
Sodium: 139 mmol/L (ref 135–145)
Total Bilirubin: 0.9 mg/dL (ref 0.3–1.2)
Total Protein: 6.9 g/dL (ref 6.5–8.1)

## 2017-04-28 LAB — DIFFERENTIAL
Basophils Absolute: 0 10*3/uL (ref 0–0.1)
Basophils Relative: 0 %
EOS PCT: 0 %
Eosinophils Absolute: 0 10*3/uL (ref 0–0.7)
LYMPHS ABS: 0.5 10*3/uL — AB (ref 1.0–3.6)
LYMPHS PCT: 5 %
MONO ABS: 0.4 10*3/uL (ref 0.2–0.9)
MONOS PCT: 5 %
NEUTROS PCT: 90 %
Neutro Abs: 8.6 10*3/uL — ABNORMAL HIGH (ref 1.4–6.5)

## 2017-04-28 LAB — URINALYSIS, COMPLETE (UACMP) WITH MICROSCOPIC
Bilirubin Urine: NEGATIVE
Glucose, UA: NEGATIVE mg/dL
Hgb urine dipstick: NEGATIVE
KETONES UR: NEGATIVE mg/dL
Leukocytes, UA: NEGATIVE
Nitrite: POSITIVE — AB
PROTEIN: NEGATIVE mg/dL
Specific Gravity, Urine: 1.018 (ref 1.005–1.030)
pH: 5 (ref 5.0–8.0)

## 2017-04-28 LAB — CBC
HEMATOCRIT: 31.2 % — AB (ref 35.0–47.0)
HEMOGLOBIN: 9 g/dL — AB (ref 12.0–16.0)
MCH: 20 pg — AB (ref 26.0–34.0)
MCHC: 28.7 g/dL — ABNORMAL LOW (ref 32.0–36.0)
MCV: 69.8 fL — AB (ref 80.0–100.0)
Platelets: 247 10*3/uL (ref 150–440)
RBC: 4.47 MIL/uL (ref 3.80–5.20)
RDW: 19.1 % — ABNORMAL HIGH (ref 11.5–14.5)
WBC: 9.6 10*3/uL (ref 3.6–11.0)

## 2017-04-28 LAB — TROPONIN I: Troponin I: <0.03 ng/mL (ref <0.03)

## 2017-04-28 LAB — ETHANOL: Alcohol, Ethyl (B): <10 mg/dL (ref <10)

## 2017-04-28 MED ORDER — ACETAMINOPHEN 325 MG PO TABS
650.0000 mg | ORAL_TABLET | Freq: Once | ORAL | Status: AC
  Administered 2017-04-28: 650 mg via ORAL
  Filled 2017-04-28: qty 2

## 2017-04-28 MED ORDER — CEPHALEXIN 500 MG PO CAPS: 500.0000 mg | ORAL_CAPSULE | Freq: Two times a day (BID) | ORAL | 0 refills | Status: DC

## 2017-04-28 MED ORDER — CEFTRIAXONE SODIUM IN DEXTROSE 20 MG/ML IV SOLN
1.0000 g | Freq: Once | INTRAVENOUS | Status: AC
  Administered 2017-04-28: 1 g via INTRAVENOUS
  Filled 2017-04-28: qty 50

## 2017-04-28 NOTE — ED Triage Notes (Signed)
Pt to ED via [facility] from home for Left arm pain and left side pain. Pt reportedly fell this morning around 0830. EMS reports that patient told them that she thinks she may have passed out. Pt is on blood thinners. Pt in NAD at this time.

## 2017-04-28 NOTE — ED Provider Notes (Signed)
[facility]  I accepted care from Dr. Burlene Arnt ____________________________________________    LABS (pertinent positives/negatives)  I, Lisa Roca, MD have personally reviewed the lab reports noted below.  Labs Reviewed  URINALYSIS, COMPLETE (UACMP) WITH MICROSCOPIC - Abnormal; Notable for the following components:      Result Value   Color, Urine YELLOW (*)    APPearance CLEAR (*)    Nitrite POSITIVE (*)    Bacteria, UA FEW (*)    Squamous Epithelial / LPF 0-5 (*)    All other components within normal limits  COMPREHENSIVE METABOLIC PANEL - Abnormal; Notable for the following components:   Glucose, Bld 103 (*)    All other components within normal limits  CBC - Abnormal; Notable for the following components:   Hemoglobin 9.0 (*)    HCT 31.2 (*)    MCV 69.8 (*)    MCH 20.0 (*)    MCHC 28.7 (*)    RDW 19.1 (*)    All other components within normal limits  DIFFERENTIAL - Abnormal; Notable for the following components:   Neutro Abs 8.6 (*)    Lymphs Abs 0.5 (*)    All other components within normal limits  URINE CULTURE  ETHANOL  TROPONIN I  CBC WITH DIFFERENTIAL/PLATELET     ____________________________________________    RADIOLOGY All xrays were viewed by me. Imaging interpreted by radiologist.  I, Lisa Roca MD have personally reviewed the imaging report noted below.  Chest x-ray two-view:  IMPRESSION: 1. Moderate CHF. 2. Aortic Atherosclerosis (ICD10-I70.0).  Lumbar spine complete x-rays:  IMPRESSION: Age-indeterminate superior endplate compression fracture of L2 vertebral body with 20-25% anterior height loss.  Osseous demineralization with multilevel degenerative disc and facet disease changes of the lumbar spine.  Right hip and pelvis x-rays:  IMPRESSION: No acute osseous abnormalities.  CT head and cervical spine without contrast:  IMPRESSION: CT of the head: Chronic changes without acute abnormality.  CT of the  cervical spine: Multilevel degenerative change without acute abnormality.  Ct pelvis:  IMPRESSION: No acute bony findings.  ____________________________________________   PROCEDURES  Procedure(s) performed: None  Critical Care performed: None  ____________________________________________   INITIAL IMPRESSION / ASSESSMENT AND PLAN / ED COURSE   Pertinent labs & imaging results that were available during my care of the patient were reviewed by me and considered in my medical decision making (see chart for details).  Imaging reviewed, head and CT as well as chest x-ray and pelvis/right hip are all reassuring for no serious traumatic injury.  L2 indeterminate compression fracture, discussed symptoms with patient.  She does not have point tenderness over this area, some more suspicious of a chronic finding there.  Her main complaint is pain into the left hip.  Given that the reason they called was that she was having trouble walking due to that, I would go ahead and CT the head to make sure there is not an occult fracture seen there.  From the standpoint of the fall without it was syncopal, he is not quite clear, her medical evaluation is overall reassuring.  She is slightly anemic at 9, previously 10 and 9 in the past so I think is actually consistent with previous.  No significant dehydration or electrolyte abnormalities.  Awaiting urinalysis.  UA is nitrite positive urinary tract infection.  Patient given 1 dose of IV Rocephin, urine culture sent.  Patient will be discharged on Keflex.  Pelvis CT shows no occult fracture.   CONSULTATIONS: None   Patient /  Family / Caregiver informed of clinical course, medical decision-making process, and agree with plan.   I discussed return precautions, follow-up instructions, and discharged instructions with patient and/or family.   Discharge instructions:  You are evaluated after fall, no serious injury is suspected.  You may be very sore  the next couple of days.  You may take Tylenol and/or ibuprofen at home, use according to directions on over-the-counter labeling.  You are also found to have a urinary tract infection ane are being discharged home on antibiotic.  Return to the emergency room immediately for any new or worsening condition including uncontrolled pain, or any worsening symptoms of urinary infection including abdominal pain, confusion or altered mental status, fever, vomiting, or any other symptoms concerning to you.   ____________________________________________   FINAL CLINICAL IMPRESSION(S) / ED DIAGNOSES  Final diagnoses:  Closed compression fracture of second lumbar vertebra, initial encounter ([place])  Urinary tract infection without hematuria, site unspecified        Lisa Roca, MD 04/28/17 [id number]

## 2017-04-28 NOTE — ED Notes (Signed)
Patient transported to CT 

## 2017-04-28 NOTE — ED Provider Notes (Addendum)
[facility] Emergency Department Provider Note  ____________________________________________   I have reviewed the triage vital signs and the nursing notes. Where available I have reviewed prior notes and, if possible and indicated, outside hospital notes.    HISTORY  Chief Complaint Fall    HPI Michele Matthews is a 71 y.o. female who presents today complaining of having passed out and having pain all over her whole body.  Patient states she went out this morning in her normal state of health to smoke a cigarette and she slipped backwards, she states she saw black.  She is not sure if she saw black before she hit the ground or after, she was unconscious for very long, may be a few moments she thinks, then she was helped up.  She has pain in her entire body.  She states she did hit her head she believes.  She denies any chest pain or shortness of breath specifically in her abdominal pain focal numbness or weakness, but "everything hurts".  Patient very upset and anxious.    Past Medical History:  Diagnosis Date  . Allergy   . COPD (chronic obstructive pulmonary disease) ([place])   . Coronary artery disease   . Depression   . Emphysema of lung ([place])   . GERD (gastroesophageal reflux disease)   . Hyperlipidemia   . Hypertension   . Seizures ([place])   . Stroke [facility])     Patient Active Problem List   Diagnosis Date Noted  . Severe protein-calorie malnutrition ([place]) 04/01/2016  . Anemia, iron deficiency   . Pressure ulcer 12/27/2015  . Essential hypertension 12/27/2015  . Anemia 10/26/2015  . COPD (chronic obstructive pulmonary disease) ([place]) 06/19/2015  . Coronary artery disease 06/19/2015  . GERD (gastroesophageal reflux disease) 06/19/2015  . Hyperlipidemia, unspecified 06/19/2015  . Long term current use of anticoagulant therapy 06/19/2015  . Anxiety and depression 06/19/2015  . Symptomatic anemia 12/10/2014  . Upper GI bleed   . History of CVA with  residual deficit 12/10/2013  . Hemianopsia 12/10/2013  . Left-sided weakness 12/10/2013  . Tobacco abuse 12/10/2013    Past Surgical History:  Procedure Laterality Date  . ESOPHAGOGASTRODUODENOSCOPY N/A 08/10/2015   Procedure: ESOPHAGOGASTRODUODENOSCOPY (EGD);  Surgeon: Manya Silvas, MD;  [facility] ENDOSCOPY;  Service: Endoscopy;  Laterality: N/A;  . ESOPHAGOGASTRODUODENOSCOPY (EGD) WITH PROPOFOL N/A 12/11/2014   Procedure: ESOPHAGOGASTRODUODENOSCOPY (EGD) WITH PROPOFOL;  Surgeon: Lucilla Lame, MD;  Location: [facility] ENDOSCOPY;  Service: Endoscopy;  Laterality: N/A;  . ESOPHAGOGASTRODUODENOSCOPY (EGD) WITH PROPOFOL N/A 12/29/2015   Procedure: ESOPHAGOGASTRODUODENOSCOPY (EGD) WITH PROPOFOL;  Surgeon: Mauri Pole, MD;  Location: [facility] ENDOSCOPY;  Service: Endoscopy;  Laterality: N/A;    Prior to Admission medications   Medication Sig Start Date End Date Taking? Authorizing Provider  acetaminophen (TYLENOL) 500 MG tablet Take 1,000 mg by mouth See admin instructions. Take 2 tablets (1000 mg) by mouth every morning, may also take 2 tablets in the afternoon as needed for pain    [provider]  albuterol (PROVENTIL HFA;VENTOLIN HFA) 108 (90 Base) MCG/ACT inhaler Inhale 2 puffs into the lungs every 6 (six) hours as needed for wheezing or shortness of breath. 06/19/15   Luciana Axe, NP  aspirin EC 81 MG tablet Take 81 mg by mouth daily.    [provider]  atorvastatin (LIPITOR) 10 MG tablet TAKE 1 TABLET EVERY DAY 03/31/17   Mikey College, NP  busPIRone (BUSPAR) 7.5 MG tablet Take 1 tablet (7.5 mg  total) by mouth 2 (two) times daily. 11/03/16   Karamalegos, Devonne Doughty, DO  Fluticasone-Salmeterol (ADVAIR) 250-50 MCG/DOSE AEPB Inhale 1 puff into the lungs 2 (two) times daily. 08/07/15   Luciana Axe, NP  hydrOXYzine (ATARAX/VISTARIL) 10 MG tablet Take 1 tablet (10 mg total) by mouth 2 (two) times daily as needed for anxiety (sleep). 11/03/16   Karamalegos,  Devonne Doughty, DO  IBUPROFEN 100 JUNIOR STRENGTH 100 MG chewable tablet 200 mg. 08/08/16   [provider]  metoprolol tartrate (LOPRESSOR) 25 MG tablet TAKE 1 TABLET TWICE DAILY 02/03/17   Parks Ranger, Devonne Doughty, DO  pantoprazole (PROTONIX) 40 MG tablet TAKE 1 TABLET EVERY DAY 03/31/17   Mikey College, NP  sertraline (ZOLOFT) 100 MG tablet Take 1 tablet (100 mg total) by mouth 2 (two) times daily. 07/15/16   Karamalegos, Devonne Doughty, DO  Tetrahydrozoline HCl (VISINE OP) Place 1 drop into both eyes 2 (two) times daily as needed (dry eyes).    [provider]  tiotropium (SPIRIVA) 18 MCG inhalation capsule Place 1 capsule (18 mcg total) into inhaler and inhale daily. Patient taking differently: Place 18 mcg into inhaler and inhale daily as needed (shortness of breath).  08/07/15   Krebs, Genevie Cheshire, NP  triamcinolone cream (KENALOG) 0.5 % Apply 1 application topically 2 (two) times daily. To affected areas, for up to 2 weeks. 11/03/16   Karamalegos, Devonne Doughty, DO  warfarin (COUMADIN) 5 MG tablet TAKE 1 TABLET ON THURSDAYS & SUNDAYS AND TAKE 1/2 TABLET ON Jodi Mourning,  FRIDAYS & SATURDAYS 03/31/17   Mikey College, NP    Allergies Patient has no known allergies.  Family History  Problem Relation Age of Onset  . Alzheimer's disease Father   . Lung cancer Sister   . Breast cancer Sister   . Cancer Mother     Social History Social History   Tobacco Use  . Smoking status: Current Every Day Smoker    Packs/day: 1.00  . Smokeless tobacco: Current User  Substance Use Topics  . Alcohol use: No  . Drug use: No    Review of Systems Constitutional: No fever/chills Eyes: No visual changes. ENT: No sore throat. No stiff neck no neck pain Cardiovascular: Denies chest pain. Respiratory: Denies shortness of breath. Gastrointestinal:   no vomiting.  No diarrhea.  No constipation. Genitourinary: Negative for dysuria. Musculoskeletal: Negative lower  extremity swelling Skin: Negative for rash. Neurological: Negative for severe headaches, focal weakness or numbness.   ____________________________________________   PHYSICAL EXAM:  VITAL SIGNS: ED Triage Vitals  Enc Vitals Group     BP 04/28/17 1356 (!) 155/79     Pulse Rate 04/28/17 1356 (!) 110     Resp 04/28/17 1356 16     Temp 04/28/17 1356 97.9 F (36.6 C)     Temp Source 04/28/17 1356 Oral     SpO2 04/28/17 1356 97 %     Weight 04/28/17 1357 120 lb (54.4 kg)     Height --      Head Circumference --      Peak Flow --      Pain Score --      Pain Loc --      Pain Edu? --      Excl. in [place]? --     Constitutional: Alert and oriented. Well appearing and in no acute distress.  Very tearful and anxious but otherwise non toxic in appearance Eyes: Conjunctivae are normal Head: Atraumatic HEENT: No congestion/rhinnorhea.  Mucous membranes are moist.  Oropharynx non-erythematous Neck:   No paraspinal tenderness, no masses, no stridor Cardiovascular: Normal rate, regular rhythm. Grossly normal heart sounds.  Good peripheral circulation. Respiratory: Normal respiratory effort.  No retractions. Lungs CTAB. Abdominal: Soft and nontender. No distention. No guarding no rebound Back:  There is no focal tenderness or step off.  Patient states that it hurts everywhere but I do not elicit pain with palpation.  There is no midline tenderness there are no lesions noted. there is no CVA tenderness Musculoskeletal: No lower extremity tenderness, no upper extremity tenderness. No joint effusions, no DVT signs strong distal pulses no edema able to fully range the hips with no evidence of discomfort fully bend the knees and ankles etc. Neurologic:  Normal speech and language. No gross focal neurologic deficits are appreciated.  Skin:  Skin is warm, dry and intact. No rash noted. Psychiatric: Mood and affect are very anxious. Speech and behavior are  normal.  ____________________________________________   LABS (all labs ordered are listed, but only abnormal results are displayed)  Labs Reviewed  URINALYSIS, COMPLETE (UACMP) WITH MICROSCOPIC  COMPREHENSIVE METABOLIC PANEL  ETHANOL  CBC WITH DIFFERENTIAL/PLATELET  TROPONIN I    Pertinent labs  results that were available during my care of the patient were reviewed by me and considered in my medical decision making (see chart for details). ____________________________________________  EKG  I personally interpreted any EKGs ordered by me or triage Patient's EKG shows sinus tachycardia rate 111, patient is actively crying and upset, normal axis, no acute ischemic changes, LVH noted ____________________________________________  RADIOLOGY  Pertinent labs & imaging results that were available during my care of the patient were reviewed by me and considered in my medical decision making (see chart for details). If possible, patient and/or family made aware of any abnormal findings.  No results found. ____________________________________________    PROCEDURES  Procedure(s) performed: None  Procedures  Critical Care performed: None  ____________________________________________   INITIAL IMPRESSION / ASSESSMENT AND PLAN / ED COURSE  Pertinent labs & imaging results that were available during my care of the patient were reviewed by me and considered in my medical decision making (see chart for details).  Patient here after a fall not clear if it was syncopal or not I will obtain CT scan of the head neck given her complaints of we will obtain x-rays of her low back although I cannot reproduce pain she says it hurts back there, I will obtain imaging of the pelvis to be on the safe side although low suspicion for fracture, because patient is unclear about when she actually passed out, will obtain cardiac enzymes EKG, urinalysis etc. to rule out other pathology.  Patient is anxious  but in no acute other distress and we will continue to observe her very closely here.  ----------------------------------------- 2:55 PM on 04/28/2017 -----------------------------------------  Patient in no acute distress, awaiting further evaluation with imaging etc.  Signed out at the end of my shift.  ----------------------------------------- 3:35 PM on 04/28/2017 -----------------------------------------  Signed out to dr. Reita Cliche at the end of my shift.    ____________________________________________   FINAL CLINICAL IMPRESSION(S) / ED DIAGNOSES  Final diagnoses:  None      This chart was dictated using voice recognition software.  Despite best efforts to proofread,  errors can occur which can change meaning.      Schuyler Amor, MD 04/28/17 1456    Schuyler Amor, MD 04/28/17 1535

## 2017-04-28 NOTE — Discharge Instructions (Addendum)
You are evaluated after fall, no serious injury is suspected.  You may be very sore the next couple of days.  You may take Tylenol and/or ibuprofen at home, use according to directions on over-the-counter labeling.  You are also found to have a urinary tract infection ane are being discharged home on antibiotic.  Return to the emergency room immediately for any new or worsening condition including uncontrolled pain, or any worsening symptoms of urinary infection including abdominal pain, confusion or altered mental status, fever, vomiting, or any other symptoms concerning to you.

## 2017-04-28 NOTE — ED Notes (Signed)
Pt returned from xray and CT.

## 2017-05-01 LAB — URINE CULTURE

## 2017-05-05 ENCOUNTER — Ambulatory Visit: Payer: Medicare HMO | Admitting: Family Medicine

## 2017-05-08 ENCOUNTER — Emergency Department: Payer: Medicare HMO

## 2017-05-08 ENCOUNTER — Other Ambulatory Visit: Payer: Self-pay

## 2017-05-08 ENCOUNTER — Emergency Department
Admission: EM | Admit: 2017-05-08 | Discharge: 2017-05-11 | Disposition: A | Discharge status: Home / Self Care | Payer: Medicare HMO | Attending: Emergency Medicine | Admitting: Emergency Medicine

## 2017-05-08 DIAGNOSIS — Z7982 Long term (current) use of aspirin: Secondary | ICD-10-CM | POA: Diagnosis not present

## 2017-05-08 DIAGNOSIS — Z79899 Other long term (current) drug therapy: Secondary | ICD-10-CM | POA: Diagnosis not present

## 2017-05-08 DIAGNOSIS — I2510 Atherosclerotic heart disease of native coronary artery without angina pectoris: Secondary | ICD-10-CM | POA: Insufficient documentation

## 2017-05-08 DIAGNOSIS — J189 Pneumonia, unspecified organism: Secondary | ICD-10-CM | POA: Diagnosis not present

## 2017-05-08 DIAGNOSIS — F17200 Nicotine dependence, unspecified, uncomplicated: Secondary | ICD-10-CM | POA: Insufficient documentation

## 2017-05-08 DIAGNOSIS — J449 Chronic obstructive pulmonary disease, unspecified: Secondary | ICD-10-CM | POA: Insufficient documentation

## 2017-05-08 DIAGNOSIS — I10 Essential (primary) hypertension: Secondary | ICD-10-CM | POA: Insufficient documentation

## 2017-05-08 DIAGNOSIS — R2681 Unsteadiness on feet: Secondary | ICD-10-CM

## 2017-05-08 DIAGNOSIS — R262 Difficulty in walking, not elsewhere classified: Secondary | ICD-10-CM | POA: Diagnosis not present

## 2017-05-08 DIAGNOSIS — Z8673 Personal history of transient ischemic attack (TIA), and cerebral infarction without residual deficits: Secondary | ICD-10-CM | POA: Diagnosis not present

## 2017-05-08 DIAGNOSIS — F329 Major depressive disorder, single episode, unspecified: Secondary | ICD-10-CM | POA: Diagnosis not present

## 2017-05-08 DIAGNOSIS — M25551 Pain in right hip: Secondary | ICD-10-CM | POA: Diagnosis not present

## 2017-05-08 DIAGNOSIS — Z7901 Long term (current) use of anticoagulants: Secondary | ICD-10-CM | POA: Insufficient documentation

## 2017-05-08 DIAGNOSIS — M25552 Pain in left hip: Secondary | ICD-10-CM | POA: Insufficient documentation

## 2017-05-08 LAB — CBC WITH DIFFERENTIAL/PLATELET
Basophils Absolute: 0.1 10*3/uL (ref 0–0.1)
Basophils Relative: 1 %
EOS ABS: 0.1 10*3/uL (ref 0–0.7)
Eosinophils Relative: 1 %
HEMATOCRIT: 33.7 % — AB (ref 35.0–47.0)
HEMOGLOBIN: 9.8 g/dL — AB (ref 12.0–16.0)
LYMPHS ABS: 0.6 10*3/uL — AB (ref 1.0–3.6)
Lymphocytes Relative: 9 %
MCH: 20.6 pg — AB (ref 26.0–34.0)
MCHC: 29.1 g/dL — AB (ref 32.0–36.0)
MCV: 70.5 fL — AB (ref 80.0–100.0)
MONOS PCT: 5 %
Monocytes Absolute: 0.3 10*3/uL (ref 0.2–0.9)
NEUTROS PCT: 84 %
Neutro Abs: 5.2 10*3/uL (ref 1.4–6.5)
Platelets: 303 10*3/uL (ref 150–440)
RBC: 4.78 MIL/uL (ref 3.80–5.20)
RDW: 21 % — ABNORMAL HIGH (ref 11.5–14.5)
WBC: 6.2 10*3/uL (ref 3.6–11.0)

## 2017-05-08 LAB — URINALYSIS, COMPLETE (UACMP) WITH MICROSCOPIC
BILIRUBIN URINE: NEGATIVE
Bacteria, UA: NONE SEEN
Glucose, UA: NEGATIVE mg/dL
Hgb urine dipstick: NEGATIVE
KETONES UR: NEGATIVE mg/dL
Nitrite: NEGATIVE
PH: 5 (ref 5.0–8.0)
PROTEIN: 30 mg/dL — AB
Specific Gravity, Urine: 1.028 (ref 1.005–1.030)

## 2017-05-08 LAB — BASIC METABOLIC PANEL
ANION GAP: 10 (ref 5–15)
BUN: 17 mg/dL (ref 6–20)
CHLORIDE: 107 mmol/L (ref 101–111)
CO2: 24 mmol/L (ref 22–32)
Calcium: 9.2 mg/dL (ref 8.9–10.3)
Creatinine, Ser: 0.59 mg/dL (ref 0.44–1.00)
GFR calc non Af Amer: >60 mL/min (ref >60)
Glucose, Bld: 100 mg/dL — ABNORMAL HIGH (ref 65–99)
POTASSIUM: 3 mmol/L — AB (ref 3.5–5.1)
SODIUM: 141 mmol/L (ref 135–145)

## 2017-05-08 MED ORDER — SERTRALINE HCL 50 MG PO TABS
100.0000 mg | ORAL_TABLET | Freq: Two times a day (BID) | ORAL | Status: DC
  Administered 2017-05-08 – 2017-05-11 (×6): 100 mg via ORAL
  Filled 2017-05-08 (×6): qty 2

## 2017-05-08 MED ORDER — BUSPIRONE HCL 5 MG PO TABS
7.5000 mg | ORAL_TABLET | Freq: Two times a day (BID) | ORAL | Status: DC
  Administered 2017-05-08 – 2017-05-11 (×6): 7.5 mg via ORAL
  Filled 2017-05-08 (×6): qty 2

## 2017-05-08 MED ORDER — TRAMADOL HCL 50 MG PO TABS
50.0000 mg | ORAL_TABLET | Freq: Once | ORAL | Status: AC
  Administered 2017-05-08: 50 mg via ORAL
  Filled 2017-05-08: qty 1

## 2017-05-08 MED ORDER — HYDROXYZINE HCL 10 MG PO TABS
10.0000 mg | ORAL_TABLET | Freq: Two times a day (BID) | ORAL | Status: DC | PRN
  Administered 2017-05-08: 10 mg via ORAL
  Filled 2017-05-08 (×5): qty 1

## 2017-05-08 MED ORDER — ALBUTEROL SULFATE (2.5 MG/3ML) 0.083% IN NEBU: 3.0000 mL | INHALATION_SOLUTION | Freq: Four times a day (QID) | RESPIRATORY_TRACT | Status: DC | PRN

## 2017-05-08 MED ORDER — SODIUM CHLORIDE 0.9 % IV BOLUS (SEPSIS)
1000.0000 mL | Freq: Once | INTRAVENOUS | Status: AC
  Administered 2017-05-08: 1000 mL via INTRAVENOUS

## 2017-05-08 MED ORDER — ACETAMINOPHEN 325 MG PO TABS
650.0000 mg | ORAL_TABLET | Freq: Once | ORAL | Status: AC
  Administered 2017-05-08: 650 mg via ORAL
  Filled 2017-05-08: qty 2

## 2017-05-08 MED ORDER — LEVOFLOXACIN 750 MG PO TABS: 750.0000 mg | ORAL_TABLET | Freq: Every day | ORAL | 0 refills | Status: AC

## 2017-05-08 MED ORDER — ASPIRIN EC 81 MG PO TBEC
81.0000 mg | DELAYED_RELEASE_TABLET | Freq: Every day | ORAL | Status: DC
  Administered 2017-05-09 – 2017-05-11 (×3): 81 mg via ORAL
  Filled 2017-05-08 (×3): qty 1

## 2017-05-08 MED ORDER — ATORVASTATIN CALCIUM 20 MG PO TABS
10.0000 mg | ORAL_TABLET | Freq: Every day | ORAL | Status: DC
  Administered 2017-05-09 – 2017-05-11 (×3): 10 mg via ORAL
  Filled 2017-05-08 (×3): qty 1

## 2017-05-08 MED ORDER — METOPROLOL TARTRATE 25 MG PO TABS
25.0000 mg | ORAL_TABLET | Freq: Two times a day (BID) | ORAL | Status: DC
  Administered 2017-05-08 – 2017-05-11 (×6): 25 mg via ORAL
  Filled 2017-05-08 (×6): qty 1

## 2017-05-08 MED ORDER — LEVOFLOXACIN 750 MG PO TABS
750.0000 mg | ORAL_TABLET | Freq: Once | ORAL | Status: AC
  Administered 2017-05-08: 750 mg via ORAL
  Filled 2017-05-08: qty 1

## 2017-05-08 MED ORDER — PANTOPRAZOLE SODIUM 40 MG PO TBEC
40.0000 mg | DELAYED_RELEASE_TABLET | Freq: Every day | ORAL | Status: DC
  Administered 2017-05-09 – 2017-05-11 (×3): 40 mg via ORAL
  Filled 2017-05-08 (×3): qty 1

## 2017-05-08 NOTE — ED Triage Notes (Signed)
Pt states she was seen here for a fall 12/28 and d/c from ED with no findings. Pt reports continued weakness, bilateral hip pain, headache, and difficulty ambulating at home.

## 2017-05-08 NOTE — ED Notes (Signed)
Patient transported to X-ray 

## 2017-05-08 NOTE — ED Notes (Signed)
Pt requesting something else for pain. MD notified.

## 2017-05-08 NOTE — ED Notes (Addendum)
Resumed care from sherie rn.  Pt alert.  Pt states she is going to sleep now.  nsr on monitor at 96.

## 2017-05-08 NOTE — ED Notes (Signed)
Pharmacy called for Buspar.

## 2017-05-08 NOTE — ED Provider Notes (Signed)
[facility] Emergency Department Provider Note ____________________________________________   First MD Initiated Contact with Patient 05/08/17 1221     (approximate)  I have reviewed the triage vital signs and the nursing notes.   HISTORY  Chief Complaint Weakness    HPI Michele Matthews is a 72 y.o. female with past medical history as noted below who presents with bilateral hip pain for the last week, worse on the left but moving around to both sides, and worse when she walks.  Patient reports she has had increased difficulty ambulating and has been laying down most the time.  She states that the pain started after she had a fall with her walker on 1228, for which she was seen in the emergency department.  Patient was cleared of any fractures or other severe injuries at that time, but she states that since she was discharged home on her mobility has been worsened.  She was being taken care of by her husband but he had to go to work today, so she felt like she needed to get checked out again.  The patient reports some generalized weakness, as well as some increased shortness of breath over the last week, but no fevers, vomiting or diarrhea, or urinary symptoms.  The patient states that she was diagnosed with a UTI at the last visit before the fall, and was given a dose of antibiotic in the ED but somehow did not receive a prescription for home.  She states that if her pain can be better controlled she would feel safe at home.   Past Medical History:  Diagnosis Date  . Allergy   . COPD (chronic obstructive pulmonary disease) ([place])   . Coronary artery disease   . Depression   . Emphysema of lung ([place])   . GERD (gastroesophageal reflux disease)   . Hyperlipidemia   . Hypertension   . Seizures ([place])   . Stroke [facility])     Patient Active Problem List   Diagnosis Date Noted  . Severe protein-calorie malnutrition ([place]) 04/01/2016  . Anemia, iron deficiency   .  Pressure ulcer 12/27/2015  . Essential hypertension 12/27/2015  . Anemia 10/26/2015  . COPD (chronic obstructive pulmonary disease) ([place]) 06/19/2015  . Coronary artery disease 06/19/2015  . GERD (gastroesophageal reflux disease) 06/19/2015  . Hyperlipidemia, unspecified 06/19/2015  . Long term current use of anticoagulant therapy 06/19/2015  . Anxiety and depression 06/19/2015  . Symptomatic anemia 12/10/2014  . Upper GI bleed   . History of CVA with residual deficit 12/10/2013  . Hemianopsia 12/10/2013  . Left-sided weakness 12/10/2013  . Tobacco abuse 12/10/2013    Past Surgical History:  Procedure Laterality Date  . ESOPHAGOGASTRODUODENOSCOPY N/A 08/10/2015   Procedure: ESOPHAGOGASTRODUODENOSCOPY (EGD);  Surgeon: Manya Silvas, MD;  Location: [facility] ENDOSCOPY;  Service: Endoscopy;  Laterality: N/A;  . ESOPHAGOGASTRODUODENOSCOPY (EGD) WITH PROPOFOL N/A 12/11/2014   Procedure: ESOPHAGOGASTRODUODENOSCOPY (EGD) WITH PROPOFOL;  Surgeon: Lucilla Lame, MD;  Location: [facility] ENDOSCOPY;  Service: Endoscopy;  Laterality: N/A;  . ESOPHAGOGASTRODUODENOSCOPY (EGD) WITH PROPOFOL N/A 12/29/2015   Procedure: ESOPHAGOGASTRODUODENOSCOPY (EGD) WITH PROPOFOL;  Surgeon: Mauri Pole, MD;  Location: [facility] ENDOSCOPY;  Service: Endoscopy;  Laterality: N/A;    Prior to Admission medications   Medication Sig Start Date End Date Taking? Authorizing Provider  aspirin EC 81 MG tablet Take 81 mg by mouth daily.   Yes [provider]  atorvastatin (LIPITOR) 10 MG tablet TAKE 1 TABLET EVERY DAY 03/31/17  Yes Cassell Smiles  Renee, NP  busPIRone (BUSPAR) 7.5 MG tablet Take 1 tablet (7.5 mg total) by mouth 2 (two) times daily. 11/03/16  Yes Karamalegos, Devonne Doughty, DO  diphenhydramine-acetaminophen (TYLENOL PM) 25-500 MG TABS tablet Take 1 tablet by mouth at bedtime as needed.   Yes [provider]  hydrOXYzine (ATARAX/VISTARIL) 10 MG tablet Take 1 tablet (10 mg total) by mouth 2 (two) times daily  as needed for anxiety (sleep). 11/03/16  Yes Karamalegos, Devonne Doughty, DO  metoprolol tartrate (LOPRESSOR) 25 MG tablet TAKE 1 TABLET TWICE DAILY 02/03/17  Yes Karamalegos, Devonne Doughty, DO  pantoprazole (PROTONIX) 40 MG tablet TAKE 1 TABLET EVERY DAY 03/31/17  Yes Mikey College, NP  sertraline (ZOLOFT) 100 MG tablet Take 1 tablet (100 mg total) by mouth 2 (two) times daily. 07/15/16  Yes Karamalegos, Devonne Doughty, DO  Tetrahydrozoline HCl (VISINE OP) Place 1 drop into both eyes 2 (two) times daily as needed (dry eyes).   Yes [provider]  warfarin (COUMADIN) 5 MG tablet TAKE 1 TABLET ON [place] AND TAKE 1/2 TABLET ON MONDAYS, [place], [place],  [place] Patient taking differently: Take by mouth as directed. MWF: 5mg  All Others: 2.5mg  03/31/17  Yes Mikey College, NP  albuterol (PROVENTIL HFA;VENTOLIN HFA) 108 (90 Base) MCG/ACT inhaler Inhale 2 puffs into the lungs every 6 (six) hours as needed for wheezing or shortness of breath. Patient not taking: Reported on 05/08/2017 06/19/15   Luciana Axe, NP  cephALEXin (KEFLEX) 500 MG capsule Take 1 capsule (500 mg total) by mouth 2 (two) times daily. Patient not taking: Reported on 05/08/2017 04/28/17   Lisa Roca, MD  Fluticasone-Salmeterol (ADVAIR) 250-50 MCG/DOSE AEPB Inhale 1 puff into the lungs 2 (two) times daily. Patient not taking: Reported on 05/08/2017 08/07/15   Luciana Axe, NP  levofloxacin (LEVAQUIN) 750 MG tablet Take 1 tablet (750 mg total) by mouth daily for 7 days. 05/09/17 05/16/17  Arta Silence, MD  tiotropium (SPIRIVA) 18 MCG inhalation capsule Place 1 capsule (18 mcg total) into inhaler and inhale daily. Patient not taking: Reported on 05/08/2017 08/07/15   Luciana Axe, NP  triamcinolone cream (KENALOG) 0.5 % Apply 1 application topically 2 (two) times daily. To affected areas, for up to 2 weeks. Patient not taking: Reported on 05/08/2017 11/03/16   Olin Hauser, DO      Allergies Patient has no known allergies.  Family History  Problem Relation Age of Onset  . Alzheimer's disease Father   . Lung cancer Sister   . Breast cancer Sister   . Cancer Mother     Social History Social History   Tobacco Use  . Smoking status: Current Every Day Smoker    Packs/day: 1.00  . Smokeless tobacco: Current User  Substance Use Topics  . Alcohol use: No  . Drug use: No    Review of Systems  Constitutional: No fever/chills.  Positive for generalized weakness. Eyes: No redness. ENT: No sore throat. Cardiovascular: Denies chest pain. Respiratory: Positive for shortness of breath. Gastrointestinal: No nausea, no vomiting.  No diarrhea.  Genitourinary: Negative for dysuria or frequency.  Musculoskeletal: Negative for back pain.  Positive for bilateral hip pain. Skin: Negative for rash. Neurological: Negative for headaches, focal weakness or numbness.   ____________________________________________   PHYSICAL EXAM:  VITAL SIGNS: ED Triage Vitals  Enc Vitals Group     BP 05/08/17 1212 (!) 162/88     Pulse Rate 05/08/17 1212 90  Resp 05/08/17 1212 20     Temp 05/08/17 1212 (!) 97.4 F (36.3 C)     Temp src --      SpO2 05/08/17 1212 94 %     Weight 05/08/17 1212 122 lb (55.3 kg)     Height 05/08/17 1212 5\' 6"  (1.676 m)     Head Circumference --      Peak Flow --      Pain Score 05/08/17 1211 1     Pain Loc --      Pain Edu? --      Excl. in [place]? --     Constitutional: Alert and oriented.  Chronically ill and weak appearing but in no acute distress. Eyes: Conjunctivae are normal.  EOMI.  PERRLA. Head: Atraumatic. Nose: No congestion/rhinnorhea. Mouth/Throat: Mucous membranes are somewhat dry.   Neck: Normal range of motion.  No C-spine tenderness. Cardiovascular: Normal rate, regular rhythm. Grossly normal heart sounds.  Good peripheral circulation. Respiratory: Normal respiratory effort.  No retractions. Lungs  CTAB. Gastrointestinal: Soft and nontender. No distention.  Genitourinary: No CVA tenderness. Musculoskeletal: No lower extremity edema.  Extremities warm and well perfused.  Full range of motion of bilateral hips with no deformity.  Mild tenderness to the left hip.  Full range of motion to bilateral knees.  2+ DP pulses bilaterally. Neurologic:  Normal speech and language.  5/5 motor and sensory intact in all extremities.  No gross focal neurologic deficits are appreciated.  Skin:  Skin is warm and dry. No rash noted.  Psychiatric: Mood and affect are normal. Speech and behavior are normal.  ____________________________________________   LABS (all labs ordered are listed, but only abnormal results are displayed)  Labs Reviewed  BASIC METABOLIC PANEL - Abnormal; Notable for the following components:      Result Value   Potassium 3.0 (*)    Glucose, Bld 100 (*)    All other components within normal limits  CBC WITH DIFFERENTIAL/PLATELET - Abnormal; Notable for the following components:   Hemoglobin 9.8 (*)    HCT 33.7 (*)    MCV 70.5 (*)    MCH 20.6 (*)    MCHC 29.1 (*)    RDW 21.0 (*)    Lymphs Abs 0.6 (*)    All other components within normal limits  URINALYSIS, COMPLETE (UACMP) WITH MICROSCOPIC - Abnormal; Notable for the following components:   Color, Urine YELLOW (*)    APPearance HAZY (*)    Protein, ur 30 (*)    Leukocytes, UA TRACE (*)    Squamous Epithelial / LPF 0-5 (*)    All other components within normal limits   ____________________________________________  EKG  ED ECG REPORT I, Arta Silence, the attending physician, personally viewed and interpreted this ECG.  Date: 05/08/2017 EKG Time: 1246 Rate: 80 Rhythm: normal sinus rhythm QRS Axis: normal Intervals: Borderline prolonged QT ST/T Wave abnormalities: LVH with secondary repolarization abnormality Narrative Interpretation: no evidence of acute  ischemia  ____________________________________________  RADIOLOGY  CXR: New anterior chest density that could represent pneumonia.  Cardiomegaly with prominent interstitial markings.  ____________________________________________   PROCEDURES  Procedure(s) performed: No    Critical Care performed: No ____________________________________________   INITIAL IMPRESSION / ASSESSMENT AND PLAN / ED COURSE  Pertinent labs & imaging results that were available during my care of the patient were reviewed by me and considered in my medical decision making (see chart for details).  72 year old female with past medical history as noted above presents with persistent bilateral  hip pain and generalized weakness after a fall on 12/28 for which she was seen in the emergency department.  She states she was also diagnosed with a UTI at that time but was not given any antibiotic for home.  She states she has been taking Tylenol only for the pain, but this is not treating her pain adequately.  She is able to walk and bear weight with difficulty.  I reviewed the past medical records in Epic and confirmed that the patient was seen on 04/28/2017 after a fall and syncope, and had imaging that was negative for acute fractures, and was also diagnosed with UTI and was to be sent home on an antibiotic.  On exam the patient is chronically weak and cachectic appearing, but does not appear acutely toxic.  Her mucous membranes are slightly dry, her lungs are clear, and she has full range of motion and neuro intact in all extremities, with some mild pain on range of motion of the left hip.  Overall I suspect that most likely patient is having normal pain after a hip contusion and due to her decreased ambulation as well as her chronic comorbidities she is having some deconditioning and generalized weakness as a result.  I am also concerned for an untreated UTI as I am not sure what happened with the antibiotic but  patient has not been on one.  She does not have any urinary symptoms but could certainly have an asymptomatic UTI.  Differential also includes dehydration, other metabolic abnormalities, or less likely other infection such as pneumonia.  We will obtain basic labs, chest x-ray, UA, give fluids, and reassess.  The patient states that she lives with multiple family members and will feel safe at home with her current functional status if her pain can be better controlled, so at this time there is no indication for social work or PT eval in the ED.  ----------------------------------------- 2:23 PM on 05/08/2017 -----------------------------------------  Patient's lab workup is consistent with her baseline from repeat labs except for slightly low potassium.  The patient's UA is actually negative, but the chest x-ray does show possible new infiltrate.  Patient overall clinically does not have evidence of CHF.  She does report some cough and chills recently, so this is consistent with a chest x-ray finding of pneumonia.  Given that the patient's vital signs are stable, she has no hypoxia, no evidence of sepsis, and the remainder of the lab workup is reassuring, there is no indication for admission and she would be appropriate for outpatient treatment for pneumonia.  On further discussion with the patient's husband, he believes that the patient is potentially not safe at home in her current condition and she has not been getting out of bed since her fall.  Therefore will obtain a PT and social work evaluation to determine if patient requires rehab or home care.  ----------------------------------------- 5:57 PM on 05/08/2017 -----------------------------------------  The patient was seen by PT who recommended placement in SNF.  However the social worker has not yet evaluated the patient.  At this time given that is after business hours the patient will need to wait for social work eval in the morning.  She will  board in the ED at this time.  I explained the plan to the patient and her husband who expressed understanding.  The patient will get a prescription for outpatient treatment of possible [facility] pneumonia.  I have signed the patient out to the next ED provider.  ____________________________________________  FINAL CLINICAL IMPRESSION(S) / ED DIAGNOSES  Final diagnoses:  Hip pain, bilateral  Gait instability  Community acquired pneumonia, unspecified laterality      NEW MEDICATIONS STARTED DURING THIS VISIT:  This SmartLink is deprecated. Use AVSMEDLIST instead to display the medication list for a patient.   Note:  This document was prepared using Dragon voice recognition software and may include unintentional dictation errors.    Arta Silence, MD 05/08/17 2103

## 2017-05-08 NOTE — ED Notes (Addendum)
Pt states fall last week and has been unable to get up without assistance or perform ADLs at home.  Son, Kathlyne Loud, also voiced these concerns and hoped patient would follow up with rehab.  MD notified.  Orders placed.  Son's # [phone]

## 2017-05-08 NOTE — ED Notes (Signed)
Pt moved to a hospital bed for comfort.

## 2017-05-08 NOTE — ED Notes (Signed)
Meal tray provided. Pt updated on POC.

## 2017-05-08 NOTE — Evaluation (Signed)
Physical Therapy Evaluation Patient Details Name: Michele Matthews MRN: [id number] DOB: 1946-01-23 Today's Date: 05/08/2017   History of Present Illness  presented to ER secondary to bilat hip pain, generalized weakness and inability to ambulate/care for self in home.  Of note, patient with recent fall (12/28) with subsequent presentaton to ER-all imaging CT head, c-spine negative, R hip x-ray, pelvic CT imaging negative for acute injury; did note age-indeterminate compression fracture to L2.  Clinical Impression  Upon evaluation, patient alert and oriented; follows all commands, but demonstrates significant anxiety throughout session.  Extensive encouragement for participation with all functional activities.  Bilat UE/LE strength and ROM grossly symmetrical (mild residual weakness, coordination deficit L UE), but functional for basic transfers and mobility.  Demonstrates ability to complete supine to sit with cga/close sup.  Initial sitting balance and sit/stand with RW, min assist; but with repetition, deteriorates to mod/max assist for control and balance recovery.  Poor eccentric control with stand to sit, requiring hands-on assist for balance recovery (posterior LOB) with each repetition.  Unsafe/unable to attempt gait efforts, as during final sit/stand attempt, patient unable to recover balance despite max assist/cuing from therapist.  Insistent on return to supine due to fear of falling, LOB and "feeling like I'm going to pass out".  Vitals stable and WFL; recovers quickly with return to supported position.  Unable to demonstrate safe mobility required for return home at this time. Would benefit from skilled PT to address above deficits and promote optimal return to PLOF; recommend transition to STR upon discharge from acute hospitalization.     Follow Up Recommendations SNF    Equipment Recommendations       Recommendations for Other Services       Precautions / Restrictions  Precautions Precautions: Fall Restrictions Weight Bearing Restrictions: No      Mobility  Bed Mobility Overal bed mobility: Needs Assistance Bed Mobility: Supine to Sit;Sit to Supine     Supine to sit: Supervision Sit to supine: Mod assist;Max assist   General bed mobility comments: significant increase in level of assist requried for return to bed due to fear of falling, anxiety, insistence on return to supine  Transfers Overall transfer level: Needs assistance Equipment used: Rolling walker (2 wheeled) Transfers: Sit to/from Stand Sit to Stand: Min assist;Mod assist         General transfer comment: cuing for mechanics, weight shift and lift off; limited l-spine and pelvic mobility.  Maintains very forward flexed posture, poor eccentric control with stand to sit (resulting in posterior LOB)  Ambulation/Gait             General Gait Details: unsafe/unable to attempt  Stairs            Wheelchair Mobility    Modified Rankin (Stroke Patients Only)       Balance Overall balance assessment: Needs assistance Sitting-balance support: No upper extremity supported;Feet supported Sitting balance-Leahy Scale: Fair   Postural control: Posterior lean Standing balance support: Bilateral upper extremity supported Standing balance-Leahy Scale: Poor                               Pertinent Vitals/Pain Pain Assessment: Faces Faces Pain Scale: Hurts a little bit Pain Location: bilat hips, "jumps around between the two" Pain Descriptors / Indicators: Aching;Grimacing;Guarding Pain Intervention(s): Monitored during session;Limited activity within patient's tolerance;Premedicated before session;Repositioned    Home Living Family/patient expects to be discharged to:: Private residence  Available Help at Discharge: Family(husband works outside of home) Type of Home: House Home Access: Stairs to enter Entrance Stairs-Rails: Right Entrance  Stairs-Number of Steps: [place]: One [place]: Walker - 2 wheels      Prior Function Level of Independence: Needs assistance         Comments: Sup/assist as needed for household distances and light household activities; reports progressive weakness and functional decline since fall on 12/28.  Significant anxiety/panic attacks reported.     Hand Dominance        Extremity/Trunk Assessment   Upper Extremity Assessment Upper Extremity Assessment: Overall WFL for tasks assessed    Lower Extremity Assessment Lower Extremity Assessment: Generalized weakness(grossly 4-/5, guarded due to fear of pain)       Communication   Communication: HOH  Cognition Arousal/Alertness: Awake/alert Behavior During Therapy: Anxious Overall Cognitive Status: Within Functional Limits for tasks assessed                                        General Comments      Exercises Other Exercises Other Exercises: Sit/stand x3 with RW, min/mod assist-emphasis on hand/foot placement, weight shift and overall mechanics.  Patient with spontaneous sitting on each trial, demonstrating lack of eccentric control with stand to sit.  Heavy posterior LOB, worsening with each trial.  On final trial, patient with significant anxiety/fear of falling, insistent on return to bed. Unable to redirect/encourage, ultimately requiring mod/max assist for safe return to bed.   Assessment/Plan    PT Assessment Patient needs continued PT services  PT Problem List Decreased strength;Decreased range of motion;Decreased activity tolerance;Decreased balance;Decreased mobility;Decreased coordination;Decreased cognition;Decreased knowledge of use of DME;Decreased safety awareness;Decreased knowledge of precautions;Pain       PT Treatment Interventions DME instruction;Gait training;Stair training;Functional mobility training;Therapeutic activities;Therapeutic exercise;Balance training;Cognitive  remediation;Patient/family education    PT Goals (Current goals can be found in the Care Plan section)  Acute Rehab PT Goals Patient Stated Goal: to feel better PT Goal Formulation: With patient Time For Goal Achievement: 05/22/17 Potential to Achieve Goals: Good    Frequency Min 2X/week   Barriers to discharge Decreased caregiver support      Co-evaluation               AM-PAC PT "6 Clicks" Daily Activity  Outcome Measure Difficulty turning over in bed (including adjusting bedclothes, sheets and blankets)?: Unable Difficulty moving from lying on back to sitting on the side of the bed? : Unable Difficulty sitting down on and standing up from a chair with arms (e.g., wheelchair, bedside commode, etc,.)?: Unable Help needed moving to and from a bed to chair (including a wheelchair)?: Total Help needed walking in hospital room?: Total Help needed climbing 3-5 steps with a railing? : Total 6 Click Score: 6    End of Session Equipment Utilized During Treatment: Gait belt Activity Tolerance: (limited by anxiety) Patient left: in bed;with call bell/phone within reach Nurse Communication: Mobility status PT Visit Diagnosis: Unsteadiness on feet (R26.81);Muscle weakness (generalized) (M62.81);Difficulty in walking, not elsewhere classified (R26.2)    Time: 0354-6568 PT Time Calculation (min) (ACUTE ONLY): 20 min   Charges:   PT Evaluation $PT Eval Moderate Complexity: 1 Mod PT Treatments $Therapeutic Activity: 8-22 mins   PT G Codes:   PT G-Codes **NOT FOR INPATIENT CLASS** Functional Assessment Tool Used: AM-PAC 6 Clicks Basic Mobility Functional Limitation: Mobility:  Walking and moving around Mobility: Walking and Moving Around Current Status [phone]): At least 80 percent but less than 100 percent impaired, limited or restricted Mobility: Walking and Moving Around Goal Status [phone]): At least 20 percent but less than 40 percent impaired, limited or restricted     Wilna Pennie H. Owens Shark, PT, DPT, NCS 05/08/17, 4:46 PM [phone]

## 2017-05-09 ENCOUNTER — Emergency Department: Payer: Medicare HMO

## 2017-05-09 MED ORDER — ACETAMINOPHEN 325 MG PO TABS
650.0000 mg | ORAL_TABLET | Freq: Once | ORAL | Status: AC
  Administered 2017-05-09: 650 mg via ORAL

## 2017-05-09 MED ORDER — ACETAMINOPHEN 325 MG PO TABS
ORAL_TABLET | ORAL | Status: AC
  Administered 2017-05-09: 650 mg via ORAL
  Filled 2017-05-09: qty 2

## 2017-05-09 MED ORDER — ACETAMINOPHEN 325 MG PO TABS
ORAL_TABLET | ORAL | Status: AC
  Filled 2017-05-09: qty 2

## 2017-05-09 NOTE — ED Notes (Signed)
Pt diaper is clean and dry at this time - pt repositioned and lights turned out per pt request so that she could go to sleep

## 2017-05-09 NOTE — ED Notes (Signed)
Lunch tray and juice given to pt

## 2017-05-09 NOTE — ED Notes (Signed)
Patient is resting comfortably. 

## 2017-05-09 NOTE — ED Notes (Signed)
Pt spouse in room visiting with pt

## 2017-05-09 NOTE — ED Notes (Signed)
Pt sleeping sinus on monitor.

## 2017-05-09 NOTE — ED Notes (Signed)
Supper tray given with juice

## 2017-05-09 NOTE — Clinical Social Work Note (Signed)
CSW met with pt at bedside to address consult for "Persistent hip pain after fall with negative imaging - unable to ambulate or take care of herself at home. Eval for rehab/home care." CSW introduced herself and explained role of social work. Pt from home and lives with husband, son, and daughter-in-law. P/T is recommending short term rehab at SNF. CSW provided SNF listing for review. Pt agreeable to SNF for STR. Pt prefers [facility], but agreed for CSW to fax out to [place]. Pt stated she has been to this facility at least 3 times previously. CSW offered to call pt's family to update of discharge plan and pt declined stating she will speak with her husband when he arrives at the hospital this evening.    CSW sent FL-2 to SNFs. CSW spoke with Rick Peterson in Admissions at [facility], who confirmed bed is available for pt, but needs insurance authorization. CSW started insurance authorization with Andrea at [facility] ([phone]) and faxed all clinicals to [phone]. CSW awaiting insurance authorization for SNF placement. CSW continuing to follow.   Michele Matthews, LCSWA, LCASA Clinical Social Worker-ED [phone] 

## 2017-05-09 NOTE — ED Notes (Signed)
Pt given breakfast tray

## 2017-05-09 NOTE — NC FL2 (Signed)
[place] LEVEL OF CARE SCREENING TOOL     IDENTIFICATION  Patient Name: Michele Matthews Birthdate: May 03, 1945 Sex: female Admission Date (Current Location): 05/08/2017  [place] and [place] Number:  Engineering geologist and Address:  [facility], [address] [place], [place] [address]      Provider Number: [id number]  Attending Physician Name and Address:  No att. providers found  Relative Name and Phone Number:       Current Level of Care: Hospital Recommended Level of Care: [place] Prior Approval Number:    Date Approved/Denied:   PASRR Number: [id number]  Discharge Plan: SNF    Current Diagnoses: Patient Active Problem List   Diagnosis Date Noted  . Severe protein-calorie malnutrition ([place]) 04/01/2016  . Anemia, iron deficiency   . Pressure ulcer 12/27/2015  . Essential hypertension 12/27/2015  . Anemia 10/26/2015  . COPD (chronic obstructive pulmonary disease) ([place]) 06/19/2015  . Coronary artery disease 06/19/2015  . GERD (gastroesophageal reflux disease) 06/19/2015  . Hyperlipidemia, unspecified 06/19/2015  . Long term current use of anticoagulant therapy 06/19/2015  . Anxiety and depression 06/19/2015  . Symptomatic anemia 12/10/2014  . Upper GI bleed   . History of CVA with residual deficit 12/10/2013  . Hemianopsia 12/10/2013  . Left-sided weakness 12/10/2013  . Tobacco abuse 12/10/2013    Orientation RESPIRATION BLADDER Height & Weight     Self, Time, Situation  Normal Continent Weight: 122 lb (55.3 kg) Height:  5\' 6"  (167.6 cm)  BEHAVIORAL SYMPTOMS/MOOD NEUROLOGICAL BOWEL NUTRITION STATUS      Continent Diet(Heart Healthy)  AMBULATORY STATUS COMMUNICATION OF NEEDS Skin   Extensive Assist Verbally Normal                       Personal Care Assistance Level of Assistance  Bathing, Feeding, Dressing Bathing Assistance: Limited assistance Feeding assistance: Independent Dressing  Assistance: Limited assistance     Functional Limitations Info  Sight, Hearing, Speech Sight Info: Adequate Hearing Info: Adequate Speech Info: Adequate    SPECIAL CARE FACTORS FREQUENCY  PT (By licensed PT), OT (By licensed OT)     PT Frequency: 5x OT Frequency: 5x            Contractures Contractures Info: Not present    Additional Factors Info  Code Status, Allergies, Psychotropic Code Status Info: Full Allergies Info: No Known Allergies Psychotropic Info: See Medication List         Current Medications (05/09/2017):  This is the current hospital active medication list Current Facility-Administered Medications  Medication Dose Route Frequency Provider Last Rate Last Dose  . albuterol (PROVENTIL) (2.5 MG/3ML) 0.083% nebulizer solution 3 mL  3 mL Inhalation Q6H PRN Eula Listen, MD      . aspirin EC tablet 81 mg  81 mg Oral Daily Eula Listen, MD   81 mg at 05/09/17 0939  . atorvastatin (LIPITOR) tablet 10 mg  10 mg Oral Daily Eula Listen, MD   10 mg at 05/09/17 0940  . busPIRone (BUSPAR) tablet 7.5 mg  7.5 mg Oral BID Eula Listen, MD   7.5 mg at 05/09/17 2426  . hydrOXYzine (ATARAX/VISTARIL) tablet 10 mg  10 mg Oral BID PRN Eula Listen, MD   10 mg at 05/08/17 2329  . metoprolol tartrate (LOPRESSOR) tablet 25 mg  25 mg Oral BID Eula Listen, MD   25 mg at 05/09/17 8341  . pantoprazole (PROTONIX) EC tablet 40 mg  40 mg  Oral Daily Eula Listen, MD   40 mg at 05/09/17 0865  . sertraline (ZOLOFT) tablet 100 mg  100 mg Oral BID Eula Listen, MD   100 mg at 05/09/17 7846   Current Outpatient Medications  Medication Sig Dispense Refill  . aspirin EC 81 MG tablet Take 81 mg by mouth daily.    Marland Kitchen atorvastatin (LIPITOR) 10 MG tablet TAKE 1 TABLET EVERY DAY 90 tablet 0  . busPIRone (BUSPAR) 7.5 MG tablet Take 1 tablet (7.5 mg total) by mouth 2 (two) times daily. 180 tablet 3  . diphenhydramine-acetaminophen  (TYLENOL PM) 25-500 MG TABS tablet Take 1 tablet by mouth at bedtime as needed.    . hydrOXYzine (ATARAX/VISTARIL) 10 MG tablet Take 1 tablet (10 mg total) by mouth 2 (two) times daily as needed for anxiety (sleep). 60 tablet 11  . metoprolol tartrate (LOPRESSOR) 25 MG tablet TAKE 1 TABLET TWICE DAILY 180 tablet 1  . pantoprazole (PROTONIX) 40 MG tablet TAKE 1 TABLET EVERY DAY 90 tablet 0  . sertraline (ZOLOFT) 100 MG tablet Take 1 tablet (100 mg total) by mouth 2 (two) times daily. 180 tablet 3  . Tetrahydrozoline HCl (VISINE OP) Place 1 drop into both eyes 2 (two) times daily as needed (dry eyes).    . warfarin (COUMADIN) 5 MG tablet TAKE 1 TABLET ON THURSDAYS & SUNDAYS AND TAKE 1/2 TABLET ON MONDAYS, TUESDAYS, WEDNESDAYS,  FRIDAYS & SATURDAYS (Patient taking differently: Take by mouth as directed. MWF: 5mg  All Others: 2.5mg ) 59 tablet 0  . albuterol (PROVENTIL HFA;VENTOLIN HFA) 108 (90 Base) MCG/ACT inhaler Inhale 2 puffs into the lungs every 6 (six) hours as needed for wheezing or shortness of breath. (Patient not taking: Reported on 05/08/2017) 1 Inhaler 11  . cephALEXin (KEFLEX) 500 MG capsule Take 1 capsule (500 mg total) by mouth 2 (two) times daily. (Patient not taking: Reported on 05/08/2017) 14 capsule 0  . Fluticasone-Salmeterol (ADVAIR) 250-50 MCG/DOSE AEPB Inhale 1 puff into the lungs 2 (two) times daily. (Patient not taking: Reported on 05/08/2017) 60 each 11  . levofloxacin (LEVAQUIN) 750 MG tablet Take 1 tablet (750 mg total) by mouth daily for 7 days. 7 tablet 0  . tiotropium (SPIRIVA) 18 MCG inhalation capsule Place 1 capsule (18 mcg total) into inhaler and inhale daily. (Patient not taking: Reported on 05/08/2017) 30 capsule 11  . triamcinolone cream (KENALOG) 0.5 % Apply 1 application topically 2 (two) times daily. To affected areas, for up to 2 weeks. (Patient not taking: Reported on 05/08/2017) 30 g 2     Discharge Medications: Please see discharge summary for a list of discharge  medications.  Relevant Imaging Results:  Relevant Lab Results:   Additional Information SSN: [id number]  Truitt Merle, LCSW

## 2017-05-09 NOTE — ED Notes (Signed)
Perineal care and new depends applied to pt.

## 2017-05-09 NOTE — ED Notes (Signed)
Pt turned, dried, and repositioned 

## 2017-05-09 NOTE — ED Notes (Signed)
Report off to sherie rn 

## 2017-05-09 NOTE — ED Notes (Signed)
Pt called out to state she thinks she needs to have a bowel movement. Pt states she is unable to get up to bedside commode. Pt becomes panicky when we attempt. Pt says she thinks the feeling has passed. Pt is able to do an abdominal crunch and move her legs back into bed unassisted.

## 2017-05-09 NOTE — ED Notes (Signed)
Pt assessed for wet/soiled diaper - at this time she is clean and dry

## 2017-05-10 NOTE — ED Notes (Signed)
Pt is resting with eyes closed at this time. Pt in NAD.

## 2017-05-10 NOTE — ED Notes (Signed)
Pt diaper clean. Lights turned off per pt request to get rest. No other needs voiced at this time.

## 2017-05-10 NOTE — ED Notes (Signed)
Patient given lunch tray and repositioned in bed. Patient denies any further needs at this time. Upright in bed with lights on.

## 2017-05-10 NOTE — ED Notes (Addendum)
Pt settled comfortably in bed, took nighttime meds without difficulty. Pt remains dry at this time. States "goodnight honey, I'll see you in the morning unless I need to be changed." this RN will continue to monitor closely. Pt remains on monitor. Call light within reach.

## 2017-05-10 NOTE — Clinical Social Work Note (Addendum)
CSW spoke with rep Dexter at [facility] [phone]) to check on status of insurance authorization. Dexter stated pt authorization is still under review and CSW will receive a call when a decision is made. CSW also received a call from Yolanda Manges [phone]) -Provided update.   5:49pm- CSW received call from [place] confirming insurance authorization effective 05/11/17. Ref #: [id number]. Pt will discharge tomorrow 1/10, to [facility]. CSW continuing to follow for discharge needs.   Oretha Ellis, Latanya Presser, [place] Social Worker-ED [phone]

## 2017-05-11 NOTE — ED Notes (Signed)
Pt cleaned and changed. Denies needs. Pt in NAD and appears to be resting comfortably. Will continue to monitor.

## 2017-05-11 NOTE — ED Provider Notes (Signed)
-----------------------------------------   7:39 AM on 05/11/2017 -----------------------------------------   Blood pressure (!) 156/68, pulse 80, temperature (!) 97.4 F (36.3 C), resp. rate 13, height 5\' 6"  (1.676 m), weight 55.3 kg (122 lb), SpO2 97 %.  The patient had no acute events since last update.  Calm and cooperative at this time.  Disposition is pending SW/CM for placement.     Merlyn Lot, MD 05/11/17 [phone]

## 2017-05-11 NOTE — ED Notes (Signed)
Pts brief changed  

## 2017-05-11 NOTE — ED Notes (Signed)
Breakfast tray provided to pt at this time.  

## 2017-05-11 NOTE — Clinical Social Work Note (Signed)
Pt discharging to [facility] facility. Pt aware and agreeable to discharge plan. CSW confirmed bed offer with Claiborne Billings and Richardson Landry at [facility] and clinicals were sent via the [facility]. CSW provided insurance authoriation reference [id number]. RN made aware placement is available. RN to call report at [phone] for room E13. RN Network engineer to arrange ambulance transport. CSW signing off as no further Social Work needs identified.   Oretha Ellis, Latanya Presser, [place] Social Worker-ED [phone]

## 2017-05-11 NOTE — ED Notes (Signed)
Call transportation to [facility]

## 2017-05-19 ENCOUNTER — Ambulatory Visit: Payer: Medicare HMO | Admitting: Family Medicine

## 2017-06-02 ENCOUNTER — Telehealth: Payer: Self-pay

## 2017-06-02 ENCOUNTER — Other Ambulatory Visit: Payer: Self-pay | Admitting: Family Medicine

## 2017-06-02 DIAGNOSIS — Z7901 Long term (current) use of anticoagulants: Secondary | ICD-10-CM

## 2017-06-02 DIAGNOSIS — I6930 Unspecified sequelae of cerebral infarction: Secondary | ICD-10-CM

## 2017-06-02 NOTE — Telephone Encounter (Signed)
Michele Matthews from [employer] [phone] called regarding patient PT/INR 1.5 on 05/31/2017 which was managed by rehab. Pt was on Coumadin 4 mg once daily. Pt has lab appointment for 02/08  PT/INR. [facility] will fax [place] order for Dr. Raliegh Ip to sign for 30 days for home health approval. Dr.  Parks Ranger is aware.

## 2017-06-06 ENCOUNTER — Telehealth: Payer: Self-pay | Admitting: Family Medicine

## 2017-06-06 NOTE — Telephone Encounter (Signed)
See previous note from last week 06/02/17.  Patient has been discharged from our office. We are providing 30 days of coverage of care, and authorized her [facility] services with PT 2 x weekly for 4 weeks and also spoke with [facility] today, they called to ask for social worker. This was authorized by me today to add CSW to help her locate new PCP.  Michele Matthews, [facility] 06/06/2017, 1:20 PM

## 2017-06-09 ENCOUNTER — Other Ambulatory Visit: Payer: Medicare HMO

## 2017-06-09 DIAGNOSIS — I6930 Unspecified sequelae of cerebral infarction: Secondary | ICD-10-CM

## 2017-06-09 DIAGNOSIS — Z7901 Long term (current) use of anticoagulants: Secondary | ICD-10-CM

## 2017-06-10 LAB — PROTIME-INR
INR: 4.4 — AB
PROTHROMBIN TIME: 45.5 s — AB (ref 9.0–11.5)

## 2017-06-29 ENCOUNTER — Other Ambulatory Visit: Payer: Self-pay | Admitting: Family Medicine

## 2017-06-29 ENCOUNTER — Telehealth: Payer: Self-pay | Admitting: Family Medicine

## 2017-06-29 DIAGNOSIS — I10 Essential (primary) hypertension: Secondary | ICD-10-CM

## 2017-06-29 NOTE — Telephone Encounter (Signed)
Aldrin, physical therapist with [facility] needs a verbal for PT twice a week for w weeks and an OT eval.  His call back number is [phone] and you can leave a voice message.

## 2017-06-29 NOTE — Telephone Encounter (Signed)
Is it okay to give verbal for this patient or she needs to wait for her future PCP.

## 2017-06-29 NOTE — Telephone Encounter (Signed)
I am okay to continue with [facility] PT/OT for now. No more than 4 more weeks. Then it will need to be authorized by new provider.  Nobie Putnam, [facility] 06/29/2017, 2:05 PM

## 2017-06-29 NOTE — Telephone Encounter (Signed)
Left detail message. 

## 2017-07-06 ENCOUNTER — Other Ambulatory Visit: Payer: Self-pay | Admitting: Family Medicine

## 2017-07-06 DIAGNOSIS — I10 Essential (primary) hypertension: Secondary | ICD-10-CM

## 2017-07-11 ENCOUNTER — Telehealth: Payer: Self-pay | Admitting: Nurse Practitioner

## 2017-07-11 DIAGNOSIS — I6930 Unspecified sequelae of cerebral infarction: Secondary | ICD-10-CM

## 2017-07-11 DIAGNOSIS — R531 Weakness: Secondary | ICD-10-CM

## 2017-07-11 NOTE — Telephone Encounter (Signed)
This is fine. I have signed and printed order. It can be faxed to [facility], can contact them to see if this will work or if they need verbal or any other orders.  Nobie Putnam, [facility] 07/11/2017, 5:38 PM

## 2017-07-11 NOTE — Telephone Encounter (Signed)
Sandy with [facility] wants pt to have  a 3 in one bedside commode from [facility].  Her call back number is [phone]

## 2017-07-12 NOTE — Telephone Encounter (Signed)
Faxed notes, demo and order as per their request.

## 2017-07-24 ENCOUNTER — Inpatient Hospital Stay (HOSPITAL_COMMUNITY)
Admit: 2017-07-24 | Discharge: 2017-07-24 | Disposition: A | Discharge status: Home / Self Care | Payer: Medicare HMO | Attending: Internal Medicine | Admitting: Internal Medicine

## 2017-07-24 ENCOUNTER — Encounter: Payer: Self-pay | Admitting: Medical Oncology

## 2017-07-24 ENCOUNTER — Other Ambulatory Visit: Payer: Self-pay

## 2017-07-24 ENCOUNTER — Emergency Department: Payer: Medicare HMO

## 2017-07-24 ENCOUNTER — Inpatient Hospital Stay
Admission: EM | Admit: 2017-07-24 | Discharge: 2017-07-28 | DRG: 291 | Disposition: A | Discharge status: Skilled Nursing Facility | Payer: Medicare HMO | Attending: Internal Medicine | Admitting: Internal Medicine

## 2017-07-24 DIAGNOSIS — I2510 Atherosclerotic heart disease of native coronary artery without angina pectoris: Secondary | ICD-10-CM | POA: Diagnosis present

## 2017-07-24 DIAGNOSIS — Z820 Family history of epilepsy and other diseases of the nervous system: Secondary | ICD-10-CM | POA: Diagnosis not present

## 2017-07-24 DIAGNOSIS — I509 Heart failure, unspecified: Secondary | ICD-10-CM

## 2017-07-24 DIAGNOSIS — F419 Anxiety disorder, unspecified: Secondary | ICD-10-CM | POA: Diagnosis present

## 2017-07-24 DIAGNOSIS — E785 Hyperlipidemia, unspecified: Secondary | ICD-10-CM | POA: Diagnosis present

## 2017-07-24 DIAGNOSIS — Z7982 Long term (current) use of aspirin: Secondary | ICD-10-CM | POA: Diagnosis not present

## 2017-07-24 DIAGNOSIS — Z801 Family history of malignant neoplasm of trachea, bronchus and lung: Secondary | ICD-10-CM | POA: Diagnosis not present

## 2017-07-24 DIAGNOSIS — Z7951 Long term (current) use of inhaled steroids: Secondary | ICD-10-CM | POA: Diagnosis not present

## 2017-07-24 DIAGNOSIS — R0602 Shortness of breath: Secondary | ICD-10-CM | POA: Diagnosis present

## 2017-07-24 DIAGNOSIS — J439 Emphysema, unspecified: Secondary | ICD-10-CM | POA: Diagnosis present

## 2017-07-24 DIAGNOSIS — F17210 Nicotine dependence, cigarettes, uncomplicated: Secondary | ICD-10-CM | POA: Diagnosis present

## 2017-07-24 DIAGNOSIS — Z681 Body mass index (BMI) 19 or less, adult: Secondary | ICD-10-CM

## 2017-07-24 DIAGNOSIS — E860 Dehydration: Secondary | ICD-10-CM | POA: Diagnosis present

## 2017-07-24 DIAGNOSIS — Z803 Family history of malignant neoplasm of breast: Secondary | ICD-10-CM | POA: Diagnosis not present

## 2017-07-24 DIAGNOSIS — Z8673 Personal history of transient ischemic attack (TIA), and cerebral infarction without residual deficits: Secondary | ICD-10-CM | POA: Diagnosis not present

## 2017-07-24 DIAGNOSIS — E43 Unspecified severe protein-calorie malnutrition: Secondary | ICD-10-CM | POA: Diagnosis present

## 2017-07-24 DIAGNOSIS — K219 Gastro-esophageal reflux disease without esophagitis: Secondary | ICD-10-CM | POA: Diagnosis present

## 2017-07-24 DIAGNOSIS — I5031 Acute diastolic (congestive) heart failure: Secondary | ICD-10-CM

## 2017-07-24 DIAGNOSIS — Z7901 Long term (current) use of anticoagulants: Secondary | ICD-10-CM

## 2017-07-24 DIAGNOSIS — I5042 Chronic combined systolic (congestive) and diastolic (congestive) heart failure: Secondary | ICD-10-CM

## 2017-07-24 DIAGNOSIS — I5041 Acute combined systolic (congestive) and diastolic (congestive) heart failure: Secondary | ICD-10-CM | POA: Diagnosis present

## 2017-07-24 DIAGNOSIS — I110 Hypertensive heart disease with heart failure: Principal | ICD-10-CM | POA: Diagnosis present

## 2017-07-24 DIAGNOSIS — R0902 Hypoxemia: Secondary | ICD-10-CM | POA: Diagnosis present

## 2017-07-24 LAB — CBC WITH DIFFERENTIAL/PLATELET
Basophils Absolute: 0 10*3/uL (ref 0–0.1)
Basophils Relative: 1 %
EOS ABS: 0.1 10*3/uL (ref 0–0.7)
Eosinophils Relative: 1 %
HEMATOCRIT: 34 % — AB (ref 35.0–47.0)
HEMOGLOBIN: 10.1 g/dL — AB (ref 12.0–16.0)
LYMPHS ABS: 0.5 10*3/uL — AB (ref 1.0–3.6)
Lymphocytes Relative: 10 %
MCH: 22.9 pg — ABNORMAL LOW (ref 26.0–34.0)
MCHC: 29.7 g/dL — AB (ref 32.0–36.0)
MCV: 77 fL — ABNORMAL LOW (ref 80.0–100.0)
MONO ABS: 0.3 10*3/uL (ref 0.2–0.9)
Monocytes Relative: 5 %
NEUTROS ABS: 4.3 10*3/uL (ref 1.4–6.5)
NEUTROS PCT: 83 %
Platelets: 261 10*3/uL (ref 150–440)
RBC: 4.41 MIL/uL (ref 3.80–5.20)
RDW: 26.1 % — ABNORMAL HIGH (ref 11.5–14.5)
WBC: 5.1 10*3/uL (ref 3.6–11.0)

## 2017-07-24 LAB — COMPREHENSIVE METABOLIC PANEL
ALBUMIN: 3.8 g/dL (ref 3.5–5.0)
ALK PHOS: 76 U/L (ref 38–126)
ALT: 26 U/L (ref 14–54)
AST: 34 U/L (ref 15–41)
Anion gap: 8 (ref 5–15)
BILIRUBIN TOTAL: 0.8 mg/dL (ref 0.3–1.2)
BUN: 23 mg/dL — ABNORMAL HIGH (ref 6–20)
CO2: 22 mmol/L (ref 22–32)
CREATININE: 0.72 mg/dL (ref 0.44–1.00)
Calcium: 9.3 mg/dL (ref 8.9–10.3)
Chloride: 110 mmol/L (ref 101–111)
GFR calc Af Amer: >60 mL/min (ref >60)
GFR calc non Af Amer: >60 mL/min (ref >60)
GLUCOSE: 107 mg/dL — AB (ref 65–99)
POTASSIUM: 3.8 mmol/L (ref 3.5–5.1)
Sodium: 140 mmol/L (ref 135–145)
TOTAL PROTEIN: 7.1 g/dL (ref 6.5–8.1)

## 2017-07-24 LAB — TROPONIN I
Troponin I: <0.03 ng/mL (ref <0.03)
Troponin I: <0.03 ng/mL (ref <0.03)
Troponin I: <0.03 ng/mL (ref <0.03)

## 2017-07-24 LAB — PROTIME-INR
INR: 2.22
Prothrombin Time: 24.4 seconds — ABNORMAL HIGH (ref 11.4–15.2)

## 2017-07-24 LAB — URINALYSIS, COMPLETE (UACMP) WITH MICROSCOPIC
Bilirubin Urine: NEGATIVE
Glucose, UA: NEGATIVE mg/dL
Hgb urine dipstick: NEGATIVE
Ketones, ur: NEGATIVE mg/dL
Leukocytes, UA: NEGATIVE
Nitrite: NEGATIVE
PROTEIN: NEGATIVE mg/dL
Specific Gravity, Urine: 1.006 (ref 1.005–1.030)
Squamous Epithelial / LPF: NONE SEEN
pH: 7 (ref 5.0–8.0)

## 2017-07-24 LAB — BRAIN NATRIURETIC PEPTIDE: B Natriuretic Peptide: 2545 pg/mL — ABNORMAL HIGH (ref 0.0–100.0)

## 2017-07-24 LAB — FIBRIN DERIVATIVES D-DIMER (ARMC ONLY): FIBRIN DERIVATIVES D-DIMER (ARMC): 1196.79 ng{FEU}/mL — AB (ref 0.00–499.00)

## 2017-07-24 MED ORDER — SERTRALINE HCL 50 MG PO TABS
100.0000 mg | ORAL_TABLET | Freq: Two times a day (BID) | ORAL | Status: DC
  Administered 2017-07-24 – 2017-07-28 (×8): 100 mg via ORAL
  Filled 2017-07-24 (×9): qty 2

## 2017-07-24 MED ORDER — ASPIRIN EC 81 MG PO TBEC
81.0000 mg | DELAYED_RELEASE_TABLET | Freq: Every day | ORAL | Status: DC
  Administered 2017-07-25 – 2017-07-28 (×4): 81 mg via ORAL
  Filled 2017-07-24 (×4): qty 1

## 2017-07-24 MED ORDER — SODIUM CHLORIDE 0.9 % IV SOLN: 250.0000 mL | INTRAVENOUS | Status: DC | PRN

## 2017-07-24 MED ORDER — LISINOPRIL 5 MG PO TABS
5.0000 mg | ORAL_TABLET | Freq: Every day | ORAL | Status: DC
  Administered 2017-07-24 – 2017-07-25 (×2): 5 mg via ORAL
  Filled 2017-07-24 (×2): qty 1

## 2017-07-24 MED ORDER — HYDROXYZINE HCL 10 MG PO TABS
10.0000 mg | ORAL_TABLET | Freq: Two times a day (BID) | ORAL | Status: DC | PRN
  Administered 2017-07-24: 10 mg via ORAL
  Filled 2017-07-24 (×2): qty 1

## 2017-07-24 MED ORDER — TETRAHYDROZOLINE HCL 0.05 % OP SOLN
1.0000 [drp] | Freq: Two times a day (BID) | OPHTHALMIC | Status: DC | PRN
  Filled 2017-07-24: qty 15

## 2017-07-24 MED ORDER — ACETAMINOPHEN 325 MG PO TABS
650.0000 mg | ORAL_TABLET | ORAL | Status: DC | PRN
  Administered 2017-07-24 – 2017-07-26 (×3): 650 mg via ORAL
  Filled 2017-07-24 (×3): qty 2

## 2017-07-24 MED ORDER — NICOTINE 21 MG/24HR TD PT24
21.0000 mg | MEDICATED_PATCH | Freq: Every day | TRANSDERMAL | Status: DC
  Administered 2017-07-24 – 2017-07-28 (×5): 21 mg via TRANSDERMAL
  Filled 2017-07-24 (×5): qty 1

## 2017-07-24 MED ORDER — BUSPIRONE HCL 15 MG PO TABS
7.5000 mg | ORAL_TABLET | Freq: Two times a day (BID) | ORAL | Status: DC
Start: 2017-07-24 — End: 2017-07-28
  Administered 2017-07-24 – 2017-07-28 (×8): 7.5 mg via ORAL
  Filled 2017-07-24 (×9): qty 1

## 2017-07-24 MED ORDER — IOPAMIDOL (ISOVUE-370) INJECTION 76%
75.0000 mL | Freq: Once | INTRAVENOUS | Status: AC | PRN
  Administered 2017-07-24: 75 mL via INTRAVENOUS

## 2017-07-24 MED ORDER — ONDANSETRON HCL 4 MG/2ML IJ SOLN: 4.0000 mg | Freq: Four times a day (QID) | INTRAMUSCULAR | Status: DC | PRN

## 2017-07-24 MED ORDER — WARFARIN SODIUM 5 MG PO TABS
5.0000 mg | ORAL_TABLET | Freq: Every day | ORAL | Status: DC
  Administered 2017-07-24: 5 mg via ORAL
  Filled 2017-07-24: qty 1

## 2017-07-24 MED ORDER — FUROSEMIDE 10 MG/ML IJ SOLN
40.0000 mg | Freq: Two times a day (BID) | INTRAMUSCULAR | Status: DC
  Administered 2017-07-25: 40 mg via INTRAVENOUS
  Filled 2017-07-24: qty 4

## 2017-07-24 MED ORDER — SODIUM CHLORIDE 0.9% FLUSH
3.0000 mL | Freq: Two times a day (BID) | INTRAVENOUS | Status: DC
Start: 2017-07-24 — End: 2017-07-28
  Administered 2017-07-24 – 2017-07-28 (×8): 3 mL via INTRAVENOUS

## 2017-07-24 MED ORDER — ATORVASTATIN CALCIUM 10 MG PO TABS
10.0000 mg | ORAL_TABLET | Freq: Every day | ORAL | Status: DC
Start: 2017-07-25 — End: 2017-07-28
  Administered 2017-07-25 – 2017-07-28 (×4): 10 mg via ORAL
  Filled 2017-07-24 (×4): qty 1

## 2017-07-24 MED ORDER — SODIUM CHLORIDE 0.9% FLUSH
3.0000 mL | INTRAVENOUS | Status: DC | PRN
  Administered 2017-07-26: 3 mL via INTRAVENOUS
  Filled 2017-07-24: qty 3

## 2017-07-24 MED ORDER — WARFARIN - PHYSICIAN DOSING INPATIENT: Freq: Every day | Status: DC

## 2017-07-24 MED ORDER — SODIUM CHLORIDE 0.9 % IV BOLUS
500.0000 mL | Freq: Once | INTRAVENOUS | Status: AC
  Administered 2017-07-24: 500 mL via INTRAVENOUS

## 2017-07-24 MED ORDER — FUROSEMIDE 10 MG/ML IJ SOLN
40.0000 mg | Freq: Once | INTRAMUSCULAR | Status: AC
  Administered 2017-07-24: 40 mg via INTRAVENOUS
  Filled 2017-07-24: qty 4

## 2017-07-24 MED ORDER — PANTOPRAZOLE SODIUM 40 MG PO TBEC
40.0000 mg | DELAYED_RELEASE_TABLET | Freq: Every day | ORAL | Status: DC
  Administered 2017-07-25 – 2017-07-28 (×4): 40 mg via ORAL
  Filled 2017-07-24 (×4): qty 1

## 2017-07-24 MED ORDER — METOPROLOL TARTRATE 25 MG PO TABS
25.0000 mg | ORAL_TABLET | Freq: Two times a day (BID) | ORAL | Status: DC
  Administered 2017-07-24 – 2017-07-25 (×2): 25 mg via ORAL
  Filled 2017-07-24 (×2): qty 1

## 2017-07-24 NOTE — H&P (Addendum)
[facility] [place] NAME: Michele Matthews    MR#:  [id number]  DATE OF BIRTH:  1946/03/19  DATE OF ADMISSION:  07/24/2017  PRIMARY CARE PHYSICIAN: Olin Hauser, DO   REQUESTING/REFERRING PHYSICIAN:   CHIEF COMPLAINT:   Chief Complaint  Patient presents with  . Shortness of Breath  . Diarrhea    HISTORY OF PRESENT ILLNESS: Michele Matthews  is a 72 y.o. female with a known history of emphysema, coronary artery disease, hyperlipidemia, GERD, hypertension, CVA presented to the emergency room for shortness of breath palpitations.  Patient felt jittery and felt her heart rate was racing.  She had difficulty breathing and orthopnea.  She also has noticed swelling in the lower extremities of late.  She was evaluated in the emergency room her d-dimer was elevated and BNP was elevated during the workup.  CT angiogram of the chest was done for workup which showed no pulmonary embolism but revealed fluid overload and pleural effusions.  Patient was given IV Lasix for diuresis.  No history of any heart failure.  No complaints of any chest pain.  Feels better after being diuresed with Lasix.  Had an episode of watery diarrhea this morning. hospitalist service was consulted for further care.  PAST MEDICAL HISTORY:   Past Medical History:  Diagnosis Date  . Allergy   . COPD (chronic obstructive pulmonary disease) ([place])   . Coronary artery disease   . Depression   . Emphysema of lung ([place])   . GERD (gastroesophageal reflux disease)   . Hyperlipidemia   . Hypertension   . Seizures ([place])   . Stroke [facility])     PAST SURGICAL HISTORY:  Past Surgical History:  Procedure Laterality Date  . ESOPHAGOGASTRODUODENOSCOPY N/A 08/10/2015   Procedure: ESOPHAGOGASTRODUODENOSCOPY (EGD);  Surgeon: Manya Silvas, MD;  Location[facility] ENDOSCOPY;  Service: Endoscopy;  Laterality: N/A;  . ESOPHAGOGASTRODUODENOSCOPY (EGD) WITH PROPOFOL N/A 12/11/2014    Procedure: ESOPHAGOGASTRODUODENOSCOPY (EGD) WITH PROPOFOL;  Surgeon: Lucilla Lame, MD;  Location: [facility] ENDOSCOPY;  Service: Endoscopy;  Laterality: N/A;  . ESOPHAGOGASTRODUODENOSCOPY (EGD) WITH PROPOFOL N/A 12/29/2015   Procedure: ESOPHAGOGASTRODUODENOSCOPY (EGD) WITH PROPOFOL;  Surgeon: Mauri Pole, MD;  Location: [facility] ENDOSCOPY;  Service: Endoscopy;  Laterality: N/A;    SOCIAL HISTORY:  Social History   Tobacco Use  . Smoking status: Current Every Day Smoker    Packs/day: 1.00  . Smokeless tobacco: Current User  Substance Use Topics  . Alcohol use: No    FAMILY HISTORY:  Family History  Problem Relation Age of Onset  . Alzheimer's disease Father   . Lung cancer Sister   . Breast cancer Sister   . Cancer Mother     DRUG ALLERGIES: No Known Allergies  REVIEW OF SYSTEMS:   CONSTITUTIONAL: No fever, fatigue or weakness.  EYES: No blurred or double vision.  EARS, NOSE, AND THROAT: No tinnitus or ear pain.  RESPIRATORY: No cough, has shortness of breath,  No wheezing or hemoptysis.  CARDIOVASCULAR: No chest pain,  Has orthopnea, edema.  GASTROINTESTINAL: No nausea, vomiting, diarrhea or abdominal pain.  GENITOURINARY: No dysuria, hematuria.  ENDOCRINE: No polyuria, nocturia,  HEMATOLOGY: No anemia, easy bruising or bleeding SKIN: No rash or lesion. MUSCULOSKELETAL: No joint pain or arthritis.   NEUROLOGIC: No tingling, numbness, weakness.  PSYCHIATRY: No anxiety or depression.   MEDICATIONS AT HOME:  Prior to Admission medications   Medication Sig Start Date End Date Taking? Authorizing Provider  albuterol (PROVENTIL  HFA;VENTOLIN HFA) 108 (90 Base) MCG/ACT inhaler Inhale 2 puffs into the lungs every 6 (six) hours as needed for wheezing or shortness of breath. Patient not taking: Reported on 05/08/2017 06/19/15   Luciana Axe, NP  aspirin EC 81 MG tablet Take 81 mg by mouth daily.    [provider]  atorvastatin (LIPITOR) 10 MG tablet TAKE 1 TABLET EVERY  DAY 03/31/17   Mikey College, NP  busPIRone (BUSPAR) 7.5 MG tablet Take 1 tablet (7.5 mg total) by mouth 2 (two) times daily. 11/03/16   Karamalegos, Devonne Doughty, DO  cephALEXin (KEFLEX) 500 MG capsule Take 1 capsule (500 mg total) by mouth 2 (two) times daily. Patient not taking: Reported on 05/08/2017 04/28/17   Lisa Roca, MD  diphenhydramine-acetaminophen (TYLENOL PM) 25-500 MG TABS tablet Take 1 tablet by mouth at bedtime as needed.    [provider]  Fluticasone-Salmeterol (ADVAIR) 250-50 MCG/DOSE AEPB Inhale 1 puff into the lungs 2 (two) times daily. Patient not taking: Reported on 05/08/2017 08/07/15   Luciana Axe, NP  hydrOXYzine (ATARAX/VISTARIL) 10 MG tablet Take 1 tablet (10 mg total) by mouth 2 (two) times daily as needed for anxiety (sleep). 11/03/16   Karamalegos, Devonne Doughty, DO  metoprolol tartrate (LOPRESSOR) 25 MG tablet TAKE 1 TABLET TWICE DAILY 02/03/17   Parks Ranger, Devonne Doughty, DO  pantoprazole (PROTONIX) 40 MG tablet TAKE 1 TABLET EVERY DAY 03/31/17   Mikey College, NP  sertraline (ZOLOFT) 100 MG tablet Take 1 tablet (100 mg total) by mouth 2 (two) times daily. 07/15/16   Karamalegos, Devonne Doughty, DO  Tetrahydrozoline HCl (VISINE OP) Place 1 drop into both eyes 2 (two) times daily as needed (dry eyes).    [provider]  tiotropium (SPIRIVA) 18 MCG inhalation capsule Place 1 capsule (18 mcg total) into inhaler and inhale daily. Patient not taking: Reported on 05/08/2017 08/07/15   Luciana Axe, NP  triamcinolone cream (KENALOG) 0.5 % Apply 1 application topically 2 (two) times daily. To affected areas, for up to 2 weeks. Patient not taking: Reported on 05/08/2017 11/03/16   Olin Hauser, DO  warfarin (COUMADIN) 5 MG tablet TAKE 1 TABLET ON THURSDAYS & SUNDAYS AND TAKE 1/2 TABLET ON MONDAYS, [place], [place],  [place] Patient taking differently: Take by mouth as directed. MWF: 5mg  All Others: 2.5mg  03/31/17   Mikey College, NP      PHYSICAL EXAMINATION:   VITAL SIGNS: Blood pressure (!) 159/95, pulse (!) 109, temperature 97.7 F (36.5 C), temperature source Oral, resp. rate (!) 25, height 5\' 6"  (1.676 m), weight 50.8 kg (112 lb), SpO2 90 %.  GENERAL:  72 y.o.-year-old patient lying in the bed with no acute distress.  EYES: Pupils equal, round, reactive to light and accommodation. No scleral icterus. Extraocular muscles intact.  HEENT: Head atraumatic, normocephalic. Oropharynx and nasopharynx clear.  NECK:  Supple, no jugular venous distention. No thyroid enlargement, no tenderness.  LUNGS: Decreased breath sounds bilaterally, bibasilar crepitations heard. No use of accessory muscles of respiration.  CARDIOVASCULAR: S1, S2 tachycardia noted. No murmurs, rubs, or gallops.  ABDOMEN: Soft, nontender, nondistended. Bowel sounds present. No organomegaly or mass.  EXTREMITIES: 1plus pedal edema, no cyanosis, or clubbing.  NEUROLOGIC: Cranial nerves II through XII are intact. Muscle strength 5/5 in all extremities. Sensation intact. Gait not checked.  PSYCHIATRIC: The patient is alert and oriented x 3.  SKIN: No obvious rash, lesion, or ulcer.   LABORATORY PANEL:   CBC Recent  Labs  Lab 07/24/17 1108  WBC 5.1  HGB 10.1*  HCT 34.0*  PLT 261  MCV 77.0*  MCH 22.9*  MCHC 29.7*  RDW 26.1*  LYMPHSABS 0.5*  MONOABS 0.3  EOSABS 0.1  BASOSABS 0.0   ------------------------------------------------------------------------------------------------------------------  Chemistries  Recent Labs  Lab 07/24/17 1108  NA 140  K 3.8  CL 110  CO2 22  GLUCOSE 107*  BUN 23*  CREATININE 0.72  CALCIUM 9.3  AST 34  ALT 26  ALKPHOS 76  BILITOT 0.8   ------------------------------------------------------------------------------------------------------------------ estimated creatinine clearance is 50.2 mL/min (by C-G formula based on SCr of 0.72  mg/dL). ------------------------------------------------------------------------------------------------------------------ No results for input(s): TSH, T4TOTAL, T3FREE, THYROIDAB in the last 72 hours.  Invalid input(s): FREET3   Coagulation profile Recent Labs  Lab 07/24/17 1227  INR 2.22   ------------------------------------------------------------------------------------------------------------------- No results for input(s): DDIMER in the last 72 hours. -------------------------------------------------------------------------------------------------------------------  Cardiac Enzymes Recent Labs  Lab 07/24/17 1108  TROPONINI <0.03   ------------------------------------------------------------------------------------------------------------------ Invalid input(s): POCBNP  ---------------------------------------------------------------------------------------------------------------  Urinalysis    Component Value Date/Time   COLORURINE YELLOW (A) 05/08/2017 1238   APPEARANCEUR HAZY (A) 05/08/2017 1238   APPEARANCEUR Hazy 03/27/2014 1845   LABSPEC 1.028 05/08/2017 1238   LABSPEC 1.028 03/27/2014 1845   PHURINE 5.0 05/08/2017 1238   GLUCOSEU NEGATIVE 05/08/2017 1238   GLUCOSEU Negative 03/27/2014 1845   HGBUR NEGATIVE 05/08/2017 1238   BILIRUBINUR NEGATIVE 05/08/2017 1238   BILIRUBINUR Negative 03/27/2014 1845   KETONESUR NEGATIVE 05/08/2017 1238   PROTEINUR 30 (A) 05/08/2017 1238   NITRITE NEGATIVE 05/08/2017 1238   LEUKOCYTESUR TRACE (A) 05/08/2017 1238   LEUKOCYTESUR 3+ 03/27/2014 1845     RADIOLOGY: Dg Chest 2 View  Result Date: 07/24/2017 CLINICAL DATA:  Two weeks of shortness of breath and diarrhea with fatigue. History of COPD, coronary artery disease, former smoker, previous CVA. EXAM: CHEST - 2 VIEW COMPARISON:  PA and lateral chest x-ray of May 08, 2017 FINDINGS: The lungs are reasonably well inflated. There small bilateral pleural effusions. The  interstitial markings are increased bilaterally. The cardiac silhouette is enlarged and the pulmonary vascularity engorged. There is calcification in the wall of the aortic arch. The bony thorax exhibits no acute abnormality. IMPRESSION: CHF with interstitial edema and bilateral pleural effusions. No focal pneumonia. Thoracic aortic atherosclerosis. Electronically Signed   By: David  Martinique M.D.   On: 07/24/2017 13:17   Ct Angio Chest Pe W And/or Wo Contrast  Result Date: 07/24/2017 CLINICAL DATA:  72 y/o F; 2 weeks of diarrhea and shortness of breath for 2 weeks. Hypoxia. EXAM: CT ANGIOGRAPHY CHEST WITH CONTRAST TECHNIQUE: Multidetector CT imaging of the chest was performed using the standard protocol during bolus administration of intravenous contrast. Multiplanar CT image reconstructions and MIPs were obtained to evaluate the vascular anatomy. CONTRAST:  57mL ISOVUE-370 IOPAMIDOL (ISOVUE-370) INJECTION 76% COMPARISON:  05/17/2013 CT chest.  04/28/2017 chest radiograph. FINDINGS: Cardiovascular: Cardiomegaly. Severe coronary artery calcification. Normal caliber thoracic aorta with calcific atherosclerosis. Mild motion artifact. Satisfactory opacification of pulmonary arteries. No pulmonary embolus identified. Mediastinum/Nodes: Mild prominence of mediastinal lymph nodes, likely due to vascular congestion. Normal thyroid gland. Lungs/Pleura: Moderate centrilobular emphysema. Smooth interlobular septal thickening compatible with interstitial pulmonary edema. Moderate bilateral pleural effusions. No consolidation. Upper Abdomen: Moderate hiatal hernia. Musculoskeletal: L1 compression deformity with 40% loss of height, new from 12/18. Partially visualized chronic L2 compression deformity. Review of the MIP images confirms the above findings. IMPRESSION: 1. Mild motion artifact.  No pulmonary embolus identified. 2. Interstitial pulmonary  edema and moderate pleural effusions. 3. Age-indeterminate L1 compression  deformity with 40% loss of height, new from 12/18. 4. Stable cardiomegaly, severe coronary artery calcification, and aortic atherosclerosis. 5. Moderate hiatal hernia. Electronically Signed   By: Kristine Garbe M.D.   On: 07/24/2017 14:23    EKG: Orders placed or performed during the hospital encounter of 07/24/17  . ED EKG  . ED EKG    IMPRESSION AND PLAN:  72 year old female patient with history of coronary artery disease, emphysema, hyperlipidemia, hypertension on Coumadin for anticoagulation as outpatient with INR being therapeutic presented to the emergency room for shortness of breath and tachycardia CTA chest no pulmonary embolism.  1.  New onset congestive heart failure IV Lasix 40 mg every 12 hourly for diuresis Admit patient to telemetry Echocardiogram to assess LV function  2.  Pleural effusions and fluid overload IV Lasix for diuresis  3. Coronary artery disease Resume statin medication and beta-blocker Resume Coumadin for anticoagulation  4.  DVT prophylaxis Continue oral Coumadin INR therapeutic  5.Tobacco cessation counseled to patient for six minutes Nicotine patch offered Harmful effects of smoking on respiratory system, circulatory system explained to patient.  All the records are reviewed and case discussed with ED provider. Management plans discussed with the patient, family and they are in agreement.  CODE STATUS:FULL CODE Code Status History    Date Active Date Inactive Code Status Order ID Comments User Context   12/27/2015 1829 12/31/2015 1621 Full Code [id number]  Reubin Milan, MD Inpatient   10/26/2015 1532 10/28/2015 1639 Full Code [id number]  Hillary Bow, MD ED   08/09/2015 0415 08/11/2015 1816 Full Code [id number]  Harrie Foreman, MD ED   12/12/2014 1434 12/17/2014 0057 DNR [id number]  Max Sane, MD Inpatient   12/10/2014 1538 12/12/2014 1434 Full Code [id number]  Vaughan Basta, MD Inpatient       TOTAL TIME TAKING CARE  OF THIS PATIENT: 51 minutes.    Saundra Shelling M.D on 07/24/2017 at 3:04 PM  Between 7am to 6pm - Pager - [phone]  After 6pm go to [email or web address] - password [email or web address] [facility]  Office  [phone]  CC: Primary care physician; Olin Hauser, DO

## 2017-07-24 NOTE — ED Triage Notes (Signed)
Pt from home via ems with reports of diarrhea x 2 weeks and sob x 2 weeks. When ems arrived pt was 90% on RA, on 2L came up to 97%. 1 Duoneb was given pta and 125mg  soulmedrol. Pt denies pain.

## 2017-07-24 NOTE — ED Notes (Signed)
Labs drawn and sent -

## 2017-07-24 NOTE — Progress Notes (Signed)
Advanced care plan.  Purpose of the Encounter: CODE STATUS  Parties in Attendance: Patient and family  Patient's Decision Capacity:Good  Subjective/Patient's story:  Presented for shortness of breath and tachycardia  Objective/Medical story CTA chest no pulmonary embolism Has pleural effusion and pulmonary edema  Goals of care determination: Needs diuresis Discussed cardiac resuscitation, intubation and ventilator mgmt if need arises. Patient and family want everything done  CODE STATUS: Full code   Time spent discussing advanced care planning: 16 minutes

## 2017-07-24 NOTE — ED Provider Notes (Signed)
[facility] Emergency Department Provider Note ____________________________________________   First MD Initiated Contact with Patient 07/24/17 1123     (approximate)  I have reviewed the triage vital signs and the nursing notes.   HISTORY  Chief Complaint Shortness of Breath and Diarrhea    HPI Michele Matthews is a 72 y.o. female with past medical history as noted below who presents primarily with generalized weakness over the last 2 weeks, gradual onset, persistent course, and associated with diarrhea over the last 1-2 weeks, as well as increased shortness of breath.  Patient was admitted to a rehab earlier this year and states she has not quite felt well since she left.  Today when EMS arrived, the patient was found to be borderline hypoxic.  She received DuoNeb and steroid by EMS and states she is feeling slightly better now.   Past Medical History:  Diagnosis Date  . Allergy   . COPD (chronic obstructive pulmonary disease) ([place])   . Coronary artery disease   . Depression   . Emphysema of lung ([place])   . GERD (gastroesophageal reflux disease)   . Hyperlipidemia   . Hypertension   . Seizures ([place])   . Stroke [facility])     Patient Active Problem List   Diagnosis Date Noted  . Severe protein-calorie malnutrition ([place]) 04/01/2016  . Anemia, iron deficiency   . Pressure ulcer 12/27/2015  . Essential hypertension 12/27/2015  . Anemia 10/26/2015  . COPD (chronic obstructive pulmonary disease) ([place]) 06/19/2015  . Coronary artery disease 06/19/2015  . GERD (gastroesophageal reflux disease) 06/19/2015  . Hyperlipidemia, unspecified 06/19/2015  . Long term current use of anticoagulant therapy 06/19/2015  . Anxiety and depression 06/19/2015  . Symptomatic anemia 12/10/2014  . Upper GI bleed   . History of CVA with residual deficit 12/10/2013  . Hemianopsia 12/10/2013  . Left-sided weakness 12/10/2013  . Tobacco abuse 12/10/2013    Past Surgical  History:  Procedure Laterality Date  . ESOPHAGOGASTRODUODENOSCOPY N/A 08/10/2015   Procedure: ESOPHAGOGASTRODUODENOSCOPY (EGD);  Surgeon: Manya Silvas, MD;  Location: [facility] ENDOSCOPY;  Service: Endoscopy;  Laterality: N/A;  . ESOPHAGOGASTRODUODENOSCOPY (EGD) WITH PROPOFOL N/A 12/11/2014   Procedure: ESOPHAGOGASTRODUODENOSCOPY (EGD) WITH PROPOFOL;  Surgeon: Lucilla Lame, MD;  Location: [facility] ENDOSCOPY;  Service: Endoscopy;  Laterality: N/A;  . ESOPHAGOGASTRODUODENOSCOPY (EGD) WITH PROPOFOL N/A 12/29/2015   Procedure: ESOPHAGOGASTRODUODENOSCOPY (EGD) WITH PROPOFOL;  Surgeon: Mauri Pole, MD;  Location: [facility] ENDOSCOPY;  Service: Endoscopy;  Laterality: N/A;    Prior to Admission medications   Medication Sig Start Date End Date Taking? Authorizing Provider  albuterol (PROVENTIL HFA;VENTOLIN HFA) 108 (90 Base) MCG/ACT inhaler Inhale 2 puffs into the lungs every 6 (six) hours as needed for wheezing or shortness of breath. Patient not taking: Reported on 05/08/2017 06/19/15   Luciana Axe, NP  aspirin EC 81 MG tablet Take 81 mg by mouth daily.    [provider]  atorvastatin (LIPITOR) 10 MG tablet TAKE 1 TABLET EVERY DAY 03/31/17   Mikey College, NP  busPIRone (BUSPAR) 7.5 MG tablet Take 1 tablet (7.5 mg total) by mouth 2 (two) times daily. 11/03/16   Karamalegos, Devonne Doughty, DO  cephALEXin (KEFLEX) 500 MG capsule Take 1 capsule (500 mg total) by mouth 2 (two) times daily. Patient not taking: Reported on 05/08/2017 04/28/17   Lisa Roca, MD  diphenhydramine-acetaminophen (TYLENOL PM) 25-500 MG TABS tablet Take 1 tablet by mouth at bedtime as needed.    [provider]  Fluticasone-Salmeterol (  ADVAIR) 250-50 MCG/DOSE AEPB Inhale 1 puff into the lungs 2 (two) times daily. Patient not taking: Reported on 05/08/2017 08/07/15   Luciana Axe, NP  hydrOXYzine (ATARAX/VISTARIL) 10 MG tablet Take 1 tablet (10 mg total) by mouth 2 (two) times daily as needed for anxiety (sleep).  11/03/16   Karamalegos, Devonne Doughty, DO  metoprolol tartrate (LOPRESSOR) 25 MG tablet TAKE 1 TABLET TWICE DAILY 02/03/17   Parks Ranger, Devonne Doughty, DO  pantoprazole (PROTONIX) 40 MG tablet TAKE 1 TABLET EVERY DAY 03/31/17   Mikey College, NP  sertraline (ZOLOFT) 100 MG tablet Take 1 tablet (100 mg total) by mouth 2 (two) times daily. 07/15/16   Karamalegos, Devonne Doughty, DO  Tetrahydrozoline HCl (VISINE OP) Place 1 drop into both eyes 2 (two) times daily as needed (dry eyes).    [provider]  tiotropium (SPIRIVA) 18 MCG inhalation capsule Place 1 capsule (18 mcg total) into inhaler and inhale daily. Patient not taking: Reported on 05/08/2017 08/07/15   Luciana Axe, NP  triamcinolone cream (KENALOG) 0.5 % Apply 1 application topically 2 (two) times daily. To affected areas, for up to 2 weeks. Patient not taking: Reported on 05/08/2017 11/03/16   Olin Hauser, DO  warfarin (COUMADIN) 5 MG tablet TAKE 1 TABLET ON THURSDAYS & SUNDAYS AND TAKE 1/2 TABLET ON MONDAYS, [place], [place],  [place] Patient taking differently: Take by mouth as directed. MWF: 5mg  All Others: 2.5mg  03/31/17   Mikey College, NP    Allergies Patient has no known allergies.  Family History  Problem Relation Age of Onset  . Alzheimer's disease Father   . Lung cancer Sister   . Breast cancer Sister   . Cancer Mother     Social History Social History   Tobacco Use  . Smoking status: Current Every Day Smoker    Packs/day: 1.00  . Smokeless tobacco: Current User  Substance Use Topics  . Alcohol use: No  . Drug use: No    Review of Systems  Constitutional: No fever.  Positive for generalized weakness. Eyes: No visual changes. ENT: No sore throat. Cardiovascular: Denies chest pain. Respiratory: Positive for shortness of breath. Gastrointestinal: Positive for nausea, no vomiting.  Positive for diarrhea.  Genitourinary: Negative for dysuria.  Musculoskeletal:  Negative for back pain. Skin: Negative for rash. Neurological: Negative for headache.   ____________________________________________   PHYSICAL EXAM:  VITAL SIGNS: ED Triage Vitals  Enc Vitals Group     BP 07/24/17 1059 130/79     Pulse Rate 07/24/17 1059 (!) 101     Resp 07/24/17 1059 20     Temp 07/24/17 1059 97.7 F (36.5 C)     Temp Source 07/24/17 1059 Oral     SpO2 07/24/17 1059 96 %     Weight 07/24/17 1100 112 lb (50.8 kg)     Height 07/24/17 1100 5\' 6"  (1.676 m)     Head Circumference --      Peak Flow --      Pain Score 07/24/17 1100 0     Pain Loc --      Pain Edu? --      Excl. in [place]? --     Constitutional: Alert and oriented.  Relatively comfortable appearing.  No acute distress. Eyes: Conjunctivae are normal.  EOMI. Head: Atraumatic. Nose: No congestion/rhinnorhea. Mouth/Throat: Mucous membranes are dry.   Neck: Normal range of motion.  Cardiovascular: Slightly tachycardic, regular rhythm. Grossly normal heart sounds.  Good peripheral circulation.  Respiratory: Normal respiratory effort.  No retractions.  Decreased breath sounds bilaterally.   Gastrointestinal: Soft and nontender. No distention.  Genitourinary: No flank tenderness. Musculoskeletal: No lower extremity edema.  Extremities warm and well perfused.  Neurologic:  Normal speech and language. No gross focal neurologic deficits are appreciated.  Skin:  Skin is warm and dry. No rash noted. Psychiatric: Mood and affect are normal. Speech and behavior are normal.  ____________________________________________   LABS (all labs ordered are listed, but only abnormal results are displayed)  Labs Reviewed  COMPREHENSIVE METABOLIC PANEL - Abnormal; Notable for the following components:      Result Value   Glucose, Bld 107 (*)    BUN 23 (*)    All other components within normal limits  BRAIN NATRIURETIC PEPTIDE - Abnormal; Notable for the following components:   B Natriuretic Peptide 2,545.0 (*)     All other components within normal limits  CBC WITH DIFFERENTIAL/PLATELET - Abnormal; Notable for the following components:   Hemoglobin 10.1 (*)    HCT 34.0 (*)    MCV 77.0 (*)    MCH 22.9 (*)    MCHC 29.7 (*)    RDW 26.1 (*)    Lymphs Abs 0.5 (*)    All other components within normal limits  FIBRIN DERIVATIVES D-DIMER ([facility] ONLY) - Abnormal; Notable for the following components:   Fibrin derivatives D-dimer ([facility]) 1,196.79 (*)    All other components within normal limits  TROPONIN I  URINALYSIS, COMPLETE (UACMP) WITH MICROSCOPIC   ____________________________________________  EKG  ED ECG REPORT I, Arta Silence, the attending physician, personally viewed and interpreted this ECG.  Date: 07/24/2017 EKG Time: 1101 Rate: 100 Rhythm: Sinus tachycardia with PVCs QRS Axis: normal Intervals: normal ST/T Wave abnormalities: Nonspecific lateral ST abnormalities Narrative Interpretation: no evidence of acute ischemia  ____________________________________________  RADIOLOGY  CXR: Interstitial edema and bilateral pleural effusions  ____________________________________________   PROCEDURES  Procedure(s) performed: No  Procedures  Critical Care performed: No ____________________________________________   INITIAL IMPRESSION / ASSESSMENT AND PLAN / ED COURSE  Pertinent labs & imaging results that were available during my care of the patient were reviewed by me and considered in my medical decision making (see chart for details).  72 year old female with past medical history as noted above presents with symptoms over 2 weeks, primarily consisting of diarrhea, generalized weakness, and increased shortness of breath.  Past medical records reviewed in Epic; the patient was seen in the ED for hip pain in January, and eventually admitted to rehab.  No other relevant recent records.  On exam, the patient is relatively comfortable appearing, she is borderline tachycardic,  but the remainder the vital signs are normal.  Patient's O2 sat was around 90% when EMS arrived.  The remainder the exam is as described above.  Differential is relatively broad but includes COPD exacerbation, bronchitis, viral infection, dehydration or other metabolic abnormality, or less likely acute cardiac cause.  Given the persistent tachycardia, I will obtain a d-dimer although PE is relatively low on the differential.  We will give fluids, obtain labs, chest x-ray, and reassess.  If patient's workup is negative and her symptoms are improved, consider possible discharge home.    ----------------------------------------- 2:33 PM on 07/24/2017 -----------------------------------------  Workup is most consistent with acute CHF.  The patient has evidence of pulmonary edema and pleural effusions on her chest x-ray and an elevated BNP.  Her d-dimer was also elevated, so I obtained a CT chest which was negative for PE.  Although the patient is not hypoxic and not in acute respiratory distress, given that this would be new onset CHF and she has had borderline hypoxia at home, she will need admission for stabilization and further workup.  I signed the patient out to the hospitalist, Dr. Tressia Miners.  ____________________________________________   FINAL CLINICAL IMPRESSION(S) / ED DIAGNOSES  Final diagnoses:  Acute congestive heart failure, unspecified heart failure type ([place])      NEW MEDICATIONS STARTED DURING THIS VISIT:  New Prescriptions   No medications on file     Note:  This document was prepared using Dragon voice recognition software and may include unintentional dictation errors.    Arta Silence, MD 07/24/17 1434

## 2017-07-24 NOTE — ED Notes (Signed)
Pt transported to CT ?

## 2017-07-25 LAB — MAGNESIUM: MAGNESIUM: 1.8 mg/dL (ref 1.7–2.4)

## 2017-07-25 LAB — BASIC METABOLIC PANEL
ANION GAP: 15 (ref 5–15)
BUN: 27 mg/dL — ABNORMAL HIGH (ref 6–20)
CHLORIDE: 105 mmol/L (ref 101–111)
CO2: 18 mmol/L — AB (ref 22–32)
Calcium: 9.3 mg/dL (ref 8.9–10.3)
Creatinine, Ser: 0.72 mg/dL (ref 0.44–1.00)
GFR calc Af Amer: >60 mL/min (ref >60)
Glucose, Bld: 84 mg/dL (ref 65–99)
POTASSIUM: 4.1 mmol/L (ref 3.5–5.1)
Sodium: 138 mmol/L (ref 135–145)

## 2017-07-25 LAB — PROTIME-INR
INR: 2.19
Prothrombin Time: 24.2 seconds — ABNORMAL HIGH (ref 11.4–15.2)

## 2017-07-25 LAB — ECHOCARDIOGRAM COMPLETE
Height: 66 in
Weight: 1763.68 oz

## 2017-07-25 LAB — TROPONIN I: Troponin I: <0.03 ng/mL (ref <0.03)

## 2017-07-25 MED ORDER — WARFARIN - PHARMACIST DOSING INPATIENT
Freq: Every day | Status: DC
  Administered 2017-07-26 – 2017-07-27 (×2)

## 2017-07-25 MED ORDER — WARFARIN SODIUM 5 MG PO TABS
5.0000 mg | ORAL_TABLET | ORAL | Status: DC
  Administered 2017-07-26: 5 mg via ORAL
  Filled 2017-07-25 (×2): qty 1

## 2017-07-25 MED ORDER — ADULT MULTIVITAMIN W/MINERALS CH
1.0000 | ORAL_TABLET | Freq: Every day | ORAL | Status: DC
  Administered 2017-07-25 – 2017-07-28 (×4): 1 via ORAL
  Filled 2017-07-25 (×3): qty 1

## 2017-07-25 MED ORDER — SACUBITRIL-VALSARTAN 24-26 MG PO TABS: 1.0000 | ORAL_TABLET | Freq: Two times a day (BID) | ORAL | Status: DC

## 2017-07-25 MED ORDER — SACUBITRIL-VALSARTAN 24-26 MG PO TABS
1.0000 | ORAL_TABLET | Freq: Two times a day (BID) | ORAL | Status: DC
  Administered 2017-07-26: 1 via ORAL
  Filled 2017-07-25: qty 1

## 2017-07-25 MED ORDER — ENSURE ENLIVE PO LIQD
237.0000 mL | Freq: Three times a day (TID) | ORAL | Status: DC
  Administered 2017-07-25 – 2017-07-28 (×9): 237 mL via ORAL

## 2017-07-25 MED ORDER — CARVEDILOL 3.125 MG PO TABS
3.1250 mg | ORAL_TABLET | Freq: Two times a day (BID) | ORAL | Status: DC
  Administered 2017-07-25 – 2017-07-28 (×3): 3.125 mg via ORAL
  Filled 2017-07-25 (×3): qty 1

## 2017-07-25 MED ORDER — FUROSEMIDE 10 MG/ML IJ SOLN
20.0000 mg | Freq: Two times a day (BID) | INTRAMUSCULAR | Status: DC
  Administered 2017-07-25 – 2017-07-26 (×2): 20 mg via INTRAVENOUS
  Filled 2017-07-25 (×2): qty 2

## 2017-07-25 MED ORDER — SODIUM CHLORIDE 0.9% FLUSH: 3.0000 mL | Freq: Two times a day (BID) | INTRAVENOUS | Status: DC

## 2017-07-25 MED ORDER — WARFARIN SODIUM 2.5 MG PO TABS
2.5000 mg | ORAL_TABLET | ORAL | Status: DC
  Administered 2017-07-25 – 2017-07-27 (×2): 2.5 mg via ORAL
  Filled 2017-07-25 (×2): qty 1

## 2017-07-25 NOTE — Progress Notes (Signed)
Initial Nutrition Assessment  DOCUMENTATION CODES:   Severe malnutrition in context of chronic illness  INTERVENTION:   Ensure Enlive po BID, each supplement provides 350 kcal and 20 grams of protein  Magic cup TID with meals, each supplement provides 290 kcal and 9 grams of protein  MVI daily  Liberalize diet   NUTRITION DIAGNOSIS:   Severe Malnutrition related to chronic illness(heart disease, COPD) as evidenced by severe fat depletion, severe muscle depletion.  GOAL:   Patient will meet greater than or equal to 90% of their needs  MONITOR:   PO intake, Supplement acceptance, Weight trends, I & O's, Labs  REASON FOR ASSESSMENT:   Consult Assessment of nutrition requirement/status  ASSESSMENT:   72 year old female patient with history of coronary artery disease, emphysema, hyperlipidemia, hypertension on Coumadin for anticoagulation as outpatient with INR being therapeutic presented to the emergency room for shortness of breath and tachycardia CTA chest no pulmonary embolism.   Met with pt in room today. Pt reports good appetite and oral intake at baseline. Pt reports that she eats mainly fast foods as it is just her and husband and they do not cook. Pt drinks 3 vanilla Ensure every day at home. Pt reports her UBW is around 110lbs. Pt reports that she used to weigh ~130lbs but that she hasn't weighed that for some time now. Pt reports diarrhea 3-4 times a day for the past two weeks. Pt reports that she has not had any diarrhea today and she feels this is getting better. Pt ate 100% of her breakfast this morning. RD will order supplements and liberalize diet.   Medications reviewed and include: aspirin, lasix, nicotine, warfarin  Labs reviewed: BUN 27(H) BNP 1196(H)- 3/25  Nutrition-Focused physical exam completed. Findings are severe fat and muscle depletions over entire body, and no edema.   Diet Order:  Diet regular Room service appropriate? Yes; Fluid consistency:  Thin  EDUCATION NEEDS:   Education needs have been addressed  Skin:  Reviewed RN Assessment  Last BM:  3/25  Height:   Ht Readings from Last 1 Encounters:  07/24/17 5' 6"  (1.676 m)    Weight:   Wt Readings from Last 1 Encounters:  07/25/17 108 lb 11 oz (49.3 kg)    Ideal Body Weight:  59 kg  BMI:  Body mass index is 17.54 kg/m.  Estimated Nutritional Needs:   Kcal:  1400-1600kcal/day   Protein:  73-80g/day   Fluid:  >1.2L/day or per MD  Koleen Distance MS, RD, LDN Pager #[phone] After Hours Pager: [phone]

## 2017-07-25 NOTE — Progress Notes (Signed)
Pharmacist - Prescriber Communication  This patient is slated to start Entresto. Her last dose of lisinopril was 07/25/17 at 09:12. The Entresto start time has been moved to 07/26/17 at 22:00 to allow a 36 hour washout period.   Berlin Viereck A. [place], [place].D., BCPS Clinical Pharmacist 07/25/17 13:40

## 2017-07-25 NOTE — Progress Notes (Signed)
ANTICOAGULATION CONSULT NOTE - Initial Consult  Pharmacy Consult for warfarin Indication: VTE treatment  No Known Allergies  Patient Measurements: Height: 5\' 6"  (167.6 cm) Weight: 108 lb 11 oz (49.3 kg) IBW/kg (Calculated) : 59.3 Heparin Dosing Weight:   Vital Signs: Temp: 97.7 F (36.5 C) (03/26 0434) Temp Source: Oral (03/26 0434) BP: 121/75 (03/26 0903) Pulse Rate: 91 (03/26 0903)  Labs: Recent Labs    07/24/17 1108 07/24/17 1227 07/24/17 1958 07/24/17 2318 07/25/17 0443 07/25/17 0630  HGB 10.1*  --   --   --   --   --   HCT 34.0*  --   --   --   --   --   PLT 261  --   --   --   --   --   LABPROT  --  24.4*  --   --   --  24.2*  INR  --  2.22  --   --   --  2.19  CREATININE 0.72  --   --   --  0.72  --   TROPONINI <0.03  --  <0.03 <0.03 <0.03  --     Estimated Creatinine Clearance: 48.7 mL/min (by C-G formula based on SCr of 0.72 mg/dL).   Medical History: Past Medical History:  Diagnosis Date  . Allergy   . COPD (chronic obstructive pulmonary disease) ([place])   . Coronary artery disease   . Depression   . Emphysema of lung ([place])   . GERD (gastroesophageal reflux disease)   . Hyperlipidemia   . Hypertension   . Seizures ([place])   . Stroke [facility])     Medications:  Infusions:  . sodium chloride      Assessment: 72 yof cc SOB/diarrhea with PMH emphysema, CAD, HLD, GERD, HTN, CVA, COPD, allergy, depression, seizure, stroke. Patient takes VKA PTA for hx CVA and her INR is therapeutic on admission. Pharmacy consulted to dose VKA for VTE treatment. CTA with no PE.   Goal of Therapy:  INR 2-3 Monitor platelets by anticoagulation protocol: Yes   Plan:  Continue PTA warfarin dosing - 5 mg po on Mondays, Wednesdays, and Fridays; 2.5 mg po on Tuesdays, Thursdays, Saturdays, and Sundays. Will monitor INR daily d/t drug-disease interaction with CHF and adjust as needed to maintain INR 2 to 3.   Laural Benes, Pharm.D., BCPS Clinical  Pharmacist 07/25/2017,12:48 PM

## 2017-07-25 NOTE — Progress Notes (Signed)
Left side weakness continues.  Expressive aphasia continues.

## 2017-07-25 NOTE — Progress Notes (Addendum)
[place] at [place] NAME: Michele Matthews    MR#:  [id number]  DATE OF BIRTH:  [birth year]  SUBJECTIVE:  CHIEF COMPLAINT:   Chief Complaint  Patient presents with  . Shortness of Breath  . Diarrhea   Better shortness of breath and cough. REVIEW OF SYSTEMS:  Review of Systems  Constitutional: Negative for chills, fever and malaise/fatigue.  HENT: Negative for sore throat.   Eyes: Negative for blurred vision and double vision.  Respiratory: Positive for cough, sputum production and shortness of breath. Negative for hemoptysis, wheezing and stridor.   Cardiovascular: Positive for leg swelling. Negative for chest pain, palpitations and orthopnea.  Gastrointestinal: Negative for abdominal pain, blood in stool, diarrhea, melena, nausea and vomiting.  Genitourinary: Negative for dysuria, flank pain and hematuria.  Musculoskeletal: Negative for back pain and joint pain.  Skin: Negative for rash.  Neurological: Negative for dizziness, sensory change, focal weakness, seizures, loss of consciousness, weakness and headaches.  Endo/Heme/Allergies: Negative for polydipsia.  Psychiatric/Behavioral: Negative for depression. The patient is not nervous/anxious.     DRUG ALLERGIES:  No Known Allergies VITALS:  Blood pressure 121/75, pulse 91, temperature 97.7 F (36.5 C), temperature source Oral, resp. rate (!) 28, height 5\' 6"  (1.676 m), weight 108 lb 11 oz (49.3 kg), SpO2 96 %. PHYSICAL EXAMINATION:  Physical Exam  Constitutional: She is oriented to person, place, and time.  Severe malnutrition.  HENT:  Head: Normocephalic.  Mouth/Throat: Oropharynx is clear and moist.  Eyes: Pupils are equal, round, and reactive to light. Conjunctivae and EOM are normal. No scleral icterus.  Neck: Normal range of motion. Neck supple. No JVD present. No tracheal deviation present.  Cardiovascular: Normal rate, regular rhythm and normal heart sounds. Exam reveals  no gallop.  No murmur heard. Pulmonary/Chest: Effort normal. No respiratory distress. She has no wheezes. She has rales.  Abdominal: Soft. Bowel sounds are normal. She exhibits no distension. There is no tenderness. There is no rebound.  Musculoskeletal: Normal range of motion. She exhibits no edema or tenderness.  Neurological: She is alert and oriented to person, place, and time. No cranial nerve deficit.  Skin: No rash noted. No erythema.  Psychiatric: Affect normal.   LABORATORY PANEL:  Female CBC Recent Labs  Lab 07/24/17 1108  WBC 5.1  HGB 10.1*  HCT 34.0*  PLT 261   ------------------------------------------------------------------------------------------------------------------ Chemistries  Recent Labs  Lab 07/24/17 1108 07/25/17 0443  NA 140 138  K 3.8 4.1  CL 110 105  CO2 22 18*  GLUCOSE 107* 84  BUN 23* 27*  CREATININE 0.72 0.72  CALCIUM 9.3 9.3  MG  --  1.8  AST 34  --   ALT 26  --   ALKPHOS 76  --   BILITOT 0.8  --    RADIOLOGY:  No results found. ASSESSMENT AND PLAN:   72 year old female patient with history of coronary artery disease, emphysema, hyperlipidemia, hypertension on Coumadin for anticoagulation as outpatient with INR being therapeutic presented to the emergency room for shortness of breath and tachycardia CTA chest no pulmonary embolism.  1.  New onset acute systolic congestive heart failure, EF 25-30% Decrease IV Lasix to 20 mg every 12 hourly for diuresis due to elevated BUN. Echocardiogram:  EF 25-30%. Per Dr. Humphrey Rolls, start [facility] in place of Lisinopril, first dose 07/26/17 0500 due to 36hr wash out from converting from ACE. Continue lasix and coreg. Will plan to advance coreg in outpatient setting.  2.  Pleural effusions and fluid overload IV Lasix for diuresis  3. Coronary artery disease Resumed statin and beta-blocker  Resumed Coumadin for anticoagulation, PTD.  4.  DVT prophylaxis Continue oral Coumadin INR  therapeutic  5.Tobacco cessation counseled to patient for six minutes Nicotine patch offered  Severe malnutrition.  Dietitian consult.  Generalized weakness.  PT evaluation. All the records are reviewed and case discussed with Care Management/Social Worker. Management plans discussed with the patient, family and they are in agreement.  CODE STATUS: Full Code  TOTAL TIME TAKING CARE OF THIS PATIENT: 36 minutes.   More than 50% of the time was spent in counseling/coordination of care: YES  POSSIBLE D/C IN 2 DAYS, DEPENDING ON CLINICAL CONDITION.   Demetrios Loll M.D on 07/25/2017 at 5:15 PM  Between 7am to 6pm - Pager - [phone]  After 6pm go to [email or web address] - [place] [facility]

## 2017-07-25 NOTE — Consult Note (Addendum)
Michele Matthews is a 72 y.o. female  [id number]  Primary Cardiologist: New patient to Dr. Neoma Laming Reason for Consultation: New onset CHF  HPI: 72yo female with a known medical history of CAD, hyperlipidemia, HTN, GERD, and VS presented to the ER with shortness of breath and palpitations. She felt jittery and that her heart was racing. She reported she had had shortness of breath with swelling of her feet.  BNP and d-dimer was elevated. No past history of CHF.    Review of Systems: Shortness of breath much improved, no pain reported.   Past Medical History:  Diagnosis Date  . Allergy   . COPD (chronic obstructive pulmonary disease) ([place])   . Coronary artery disease   . Depression   . Emphysema of lung ([place])   . GERD (gastroesophageal reflux disease)   . Hyperlipidemia   . Hypertension   . Seizures ([place])   . Stroke [facility])     Medications Prior to Admission  Medication Sig Dispense Refill  . aspirin EC 81 MG tablet Take 81 mg by mouth daily.    Marland Kitchen atorvastatin (LIPITOR) 10 MG tablet TAKE 1 TABLET EVERY DAY 90 tablet 0  . busPIRone (BUSPAR) 7.5 MG tablet Take 1 tablet (7.5 mg total) by mouth 2 (two) times daily. 180 tablet 3  . diphenhydramine-acetaminophen (TYLENOL PM) 25-500 MG TABS tablet Take 1 tablet by mouth at bedtime as needed.    . hydrOXYzine (ATARAX/VISTARIL) 10 MG tablet Take 1 tablet (10 mg total) by mouth 2 (two) times daily as needed for anxiety (sleep). 60 tablet 11  . metoprolol tartrate (LOPRESSOR) 25 MG tablet TAKE 1 TABLET TWICE DAILY 180 tablet 1  . Multiple Vitamin (MULTIVITAMIN) tablet Take 1 tablet by mouth daily.    . pantoprazole (PROTONIX) 40 MG tablet TAKE 1 TABLET EVERY DAY 90 tablet 0  . sertraline (ZOLOFT) 100 MG tablet Take 1 tablet (100 mg total) by mouth 2 (two) times daily. 180 tablet 3  . Tetrahydrozoline HCl (VISINE OP) Place 1 drop into both eyes 2 (two) times daily as needed (dry eyes).    . warfarin (COUMADIN) 5 MG tablet TAKE 1 TABLET  ON THURSDAYS & SUNDAYS AND TAKE 1/2 TABLET ON MONDAYS, TUESDAYS, WEDNESDAYS,  FRIDAYS & SATURDAYS (Patient taking differently: Take by mouth as directed. MWF: 5mg  All Others: 2.5mg ) 59 tablet 0  . albuterol (PROVENTIL HFA;VENTOLIN HFA) 108 (90 Base) MCG/ACT inhaler Inhale 2 puffs into the lungs every 6 (six) hours as needed for wheezing or shortness of breath. (Patient not taking: Reported on 05/08/2017) 1 Inhaler 11  . cephALEXin (KEFLEX) 500 MG capsule Take 1 capsule (500 mg total) by mouth 2 (two) times daily. (Patient not taking: Reported on 05/08/2017) 14 capsule 0  . Fluticasone-Salmeterol (ADVAIR) 250-50 MCG/DOSE AEPB Inhale 1 puff into the lungs 2 (two) times daily. (Patient not taking: Reported on 05/08/2017) 60 each 11  . tiotropium (SPIRIVA) 18 MCG inhalation capsule Place 1 capsule (18 mcg total) into inhaler and inhale daily. (Patient not taking: Reported on 05/08/2017) 30 capsule 11  . triamcinolone cream (KENALOG) 0.5 % Apply 1 application topically 2 (two) times daily. To affected areas, for up to 2 weeks. (Patient not taking: Reported on 05/08/2017) 30 g 2     . aspirin EC  81 mg Oral Daily  . atorvastatin  10 mg Oral Daily  . busPIRone  7.5 mg Oral BID  . carvedilol  3.125 mg Oral BID WC  . furosemide  20  mg Intravenous Q12H  . lisinopril  5 mg Oral Daily  . nicotine  21 mg Transdermal Daily  . pantoprazole  40 mg Oral Daily  . sertraline  100 mg Oral BID  . sodium chloride flush  3 mL Intravenous Q12H  . warfarin  5 mg Oral Daily    Infusions: . sodium chloride      No Known Allergies  Social History   Socioeconomic History  . Marital status: Married    Spouse name: Not on file  . Number of children: Not on file  . Years of education: Not on file  . Highest education level: Not on file  Occupational History  . Occupation: retired  Scientific laboratory technician  . Financial resource strain: Not on file  . Food insecurity:    Worry: Not on file    Inability: Not on file  .  Transportation needs:    Medical: Not on file    Non-medical: Not on file  Tobacco Use  . Smoking status: Current Every Day Smoker    Packs/day: 1.00  . Smokeless tobacco: Current User  Substance and Sexual Activity  . Alcohol use: No  . Drug use: No  . Sexual activity: Not Currently  Lifestyle  . Physical activity:    Days per week: Not on file    Minutes per session: Not on file  . Stress: Not on file  Relationships  . Social connections:    Talks on phone: Not on file    Gets together: Not on file    Attends religious service: Not on file    Active member of club or organization: Not on file    Attends meetings of clubs or organizations: Not on file    Relationship status: Not on file  . Intimate partner violence:    Fear of current or ex partner: Not on file    Emotionally abused: Not on file    Physically abused: Not on file    Forced sexual activity: Not on file  Other Topics Concern  . Not on file  Social History Narrative  . Not on file    Family History  Problem Relation Age of Onset  . Alzheimer's disease Father   . Lung cancer Sister   . Breast cancer Sister   . Cancer Mother     PHYSICAL EXAM: Vitals:   07/25/17 0434 07/25/17 0903  BP: 121/79 121/75  Pulse: 92 91  Resp: 18 (!) 28  Temp: 97.7 F (36.5 C)   SpO2: 93% 96%     Intake/Output Summary (Last 24 hours) at 07/25/2017 1202 Last data filed at 07/25/2017 1002 Gross per 24 hour  Intake 720 ml  Output 2100 ml  Net -1380 ml    General:  Frail, weak HEENT: normal Neck: supple. no JVD. Carotids 2+ bilat; no bruits. No lymphadenopathy or thryomegaly appreciated. Cor: PMI nondisplaced. Regular rate & rhythm. No rubs, gallops or murmurs. Lungs: clear Abdomen: soft, nontender, nondistended. No hepatosplenomegaly. No bruits or masses. Good bowel sounds. Extremities: no cyanosis, clubbing, rash, edema Neuro: alert & oriented x 3, cranial nerves grossly intact. moves all 4 extremities w/o  difficulty. Affect pleasant.  ECG: NSR 90bpm, no acute changes  Results for orders placed or performed during the hospital encounter of 07/24/17 (from the past 24 hour(s))  Fibrin derivatives D-Dimer ([place] only)     Status: Abnormal   Collection Time: 07/24/17 12:27 PM  Result Value Ref Range   Fibrin derivatives D-dimer [facility]) 1,196.79 (H)  0.00 - 499.00 ng/mL (FEU)  Protime-INR     Status: Abnormal   Collection Time: 07/24/17 12:27 PM  Result Value Ref Range   Prothrombin Time 24.4 (H) 11.4 - 15.2 seconds   INR 2.22   Troponin I     Status: None   Collection Time: 07/24/17  7:58 PM  Result Value Ref Range   Troponin I <0.03 <0.03 ng/mL  Troponin I     Status: None   Collection Time: 07/24/17 11:18 PM  Result Value Ref Range   Troponin I <0.03 <0.03 ng/mL  Troponin I     Status: None   Collection Time: 07/25/17  4:43 AM  Result Value Ref Range   Troponin I <0.03 <0.03 ng/mL  Basic metabolic panel     Status: Abnormal   Collection Time: 07/25/17  4:43 AM  Result Value Ref Range   Sodium 138 135 - 145 mmol/L   Potassium 4.1 3.5 - 5.1 mmol/L   Chloride 105 101 - 111 mmol/L   CO2 18 (L) 22 - 32 mmol/L   Glucose, Bld 84 65 - 99 mg/dL   BUN 27 (H) 6 - 20 mg/dL   Creatinine, Ser 0.72 0.44 - 1.00 mg/dL   Calcium 9.3 8.9 - 10.3 mg/dL   GFR calc non Af Amer >60 >60 mL/min   GFR calc Af Amer >60 >60 mL/min   Anion gap 15 5 - 15  Magnesium     Status: None   Collection Time: 07/25/17  4:43 AM  Result Value Ref Range   Magnesium 1.8 1.7 - 2.4 mg/dL  Protime-INR     Status: Abnormal   Collection Time: 07/25/17  6:30 AM  Result Value Ref Range   Prothrombin Time 24.2 (H) 11.4 - 15.2 seconds   INR 2.19    Dg Chest 2 View  Result Date: 07/24/2017 CLINICAL DATA:  Two weeks of shortness of breath and diarrhea with fatigue. History of COPD, coronary artery disease, former smoker, previous CVA. EXAM: CHEST - 2 VIEW COMPARISON:  PA and lateral chest x-ray of May 08, 2017 FINDINGS:  The lungs are reasonably well inflated. There small bilateral pleural effusions. The interstitial markings are increased bilaterally. The cardiac silhouette is enlarged and the pulmonary vascularity engorged. There is calcification in the wall of the aortic arch. The bony thorax exhibits no acute abnormality. IMPRESSION: CHF with interstitial edema and bilateral pleural effusions. No focal pneumonia. Thoracic aortic atherosclerosis. Electronically Signed   By: David  Martinique M.D.   On: 07/24/2017 13:17   Ct Angio Chest Pe W And/or Wo Contrast  Result Date: 07/24/2017 CLINICAL DATA:  72 y/o F; 2 weeks of diarrhea and shortness of breath for 2 weeks. Hypoxia. EXAM: CT ANGIOGRAPHY CHEST WITH CONTRAST TECHNIQUE: Multidetector CT imaging of the chest was performed using the standard protocol during bolus administration of intravenous contrast. Multiplanar CT image reconstructions and MIPs were obtained to evaluate the vascular anatomy. CONTRAST:  44mL ISOVUE-370 IOPAMIDOL (ISOVUE-370) INJECTION 76% COMPARISON:  05/17/2013 CT chest.  04/28/2017 chest radiograph. FINDINGS: Cardiovascular: Cardiomegaly. Severe coronary artery calcification. Normal caliber thoracic aorta with calcific atherosclerosis. Mild motion artifact. Satisfactory opacification of pulmonary arteries. No pulmonary embolus identified. Mediastinum/Nodes: Mild prominence of mediastinal lymph nodes, likely due to vascular congestion. Normal thyroid gland. Lungs/Pleura: Moderate centrilobular emphysema. Smooth interlobular septal thickening compatible with interstitial pulmonary edema. Moderate bilateral pleural effusions. No consolidation. Upper Abdomen: Moderate hiatal hernia. Musculoskeletal: L1 compression deformity with 40% loss of height, new from 12/18. Partially visualized chronic  L2 compression deformity. Review of the MIP images confirms the above findings. IMPRESSION: 1. Mild motion artifact.  No pulmonary embolus identified. 2. Interstitial  pulmonary edema and moderate pleural effusions. 3. Age-indeterminate L1 compression deformity with 40% loss of height, new from 12/18. 4. Stable cardiomegaly, severe coronary artery calcification, and aortic atherosclerosis. 5. Moderate hiatal hernia. Electronically Signed   By: Kristine Garbe M.D.   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   Blood pressure stable, heart rate WNL.   Advise Entresto in place of Lisinopril, first dose 07/26/17 0500 due to 36hr wash out from converting from ACE. Continue lasix and coreg. Will plan to advance coreg in outpatient setting. May discharge tomorrow and follow up outpatient Friday at [place] with [facility]. Patient should be placed on entersto.  Jake Bathe, NP-C Cell: [phone]

## 2017-07-25 NOTE — Evaluation (Signed)
Physical Therapy Evaluation Patient Details Name: Michele Matthews MRN: [id number] DOB: 07-Feb-1946 Today's Date: 07/25/2017   History of Present Illness  72 yo female with onset of SOB and CHF with EF 25-30% was admitted for rehab eval and treatment for susp PE.  Cleared PE, noted L1 compression fracture, fluid overload, cardiomegaly.  Has PMHx:  stroke, seizure, emphysema, CAD, COPD, HTN, smoker,   Clinical Impression  Pt is demonstrating a poor tolerance for standing balance control, notably requiring help to avoid falling to the Right on the walker.  Her control is possibly reduced by old L side weakness from stroke, but also was only just released from SNF to home and now is weakened by her medical issues.  Will follow acutely for strengthening and balance to increase safety with walker and control of standing posture.  Pt is with her husband who is understanding her reluctance to return to SNF but is able to see her significant losses of balance with all standing work.    Follow Up Recommendations SNF    Equipment Recommendations  Rolling walker with 5" wheels    Recommendations for Other Services       Precautions / Restrictions Precautions Precautions: Fall Precaution Comments: has L side weakness old CVA Restrictions Weight Bearing Restrictions: No      Mobility  Bed Mobility Overal bed mobility: Needs Assistance Bed Mobility: Supine to Sit;Sit to Supine     Supine to sit: Min assist Sit to supine: Min assist   General bed mobility comments: help to direct and for safety  Transfers Overall transfer level: Needs assistance Equipment used: Rolling walker (2 wheeled);1 person hand held assist Transfers: Sit to/from Stand Sit to Stand: Mod assist         General transfer comment: strong lean to R upon standing  Ambulation/Gait Ambulation/Gait assistance: Min assist Ambulation Distance (Feet): 8 Feet Assistive device: Rolling walker (2 wheeled);1 person hand held  assist Gait Pattern/deviations: Step-to pattern;Decreased stride length;Decreased stance time - left;Wide base of support;Trunk flexed Gait velocity: reduced Gait velocity interpretation: Below normal speed for age/gender General Gait Details: used bed for support to back of legs, not secure enough to walk away from bed  Stairs Stairs: (not able)          Wheelchair Mobility    Modified Rankin (Stroke Patients Only)       Balance Overall balance assessment: Needs assistance;History of Falls Sitting-balance support: Feet supported Sitting balance-Leahy Scale: Fair   Postural control: Right lateral lean Standing balance support: Bilateral upper extremity supported;During functional activity Standing balance-Leahy Scale: Poor                               Pertinent Vitals/Pain Pain Assessment: No/denies pain    Home Living Family/patient expects to be discharged to:: Private residence Living Arrangements: Spouse/significant other;Children;Non-relatives/Friends Available Help at Discharge: Family;Available PRN/intermittently Type of Home: House Home Access: Stairs to enter Entrance Stairs-Rails: Right Entrance Stairs-Number of Steps: 3 Home Layout: One level Home Equipment: Walker - 2 wheels      Prior Function Level of Independence: Needs assistance   Gait / Transfers Assistance Needed: RW with mod I until this admission  ADL's / Homemaking Assistance Needed: husband assists care of house  Comments: has been gradually losing independence since fall in Dec 2018     Hand Dominance   Dominant Hand: Right    Extremity/Trunk Assessment   Upper Extremity Assessment Upper  Extremity Assessment: Generalized weakness    Lower Extremity Assessment Lower Extremity Assessment: Generalized weakness    Cervical / Trunk Assessment Cervical / Trunk Assessment: Kyphotic  Communication   Communication: HOH  Cognition Arousal/Alertness:  Awake/alert Behavior During Therapy: WFL for tasks assessed/performed Overall Cognitive Status: Within Functional Limits for tasks assessed                                        General Comments      Exercises     Assessment/Plan    PT Assessment Patient needs continued PT services  PT Problem List Decreased strength;Decreased range of motion;Decreased activity tolerance;Decreased balance;Decreased mobility;Decreased coordination;Decreased cognition;Decreased knowledge of use of DME;Decreased safety awareness;Cardiopulmonary status limiting activity       PT Treatment Interventions DME instruction;Gait training;Stair training;Functional mobility training;Therapeutic activities;Therapeutic exercise;Balance training;Neuromuscular re-education;Patient/family education    PT Goals (Current goals can be found in the Care Plan section)  Acute Rehab PT Goals Patient Stated Goal: to feel better and go right home PT Goal Formulation: With patient/family Time For Goal Achievement: 08/08/17 Potential to Achieve Goals: Good    Frequency Min 2X/week   Barriers to discharge Decreased caregiver support family not consistently there    Co-evaluation               AM-PAC PT "6 Clicks" Daily Activity  Outcome Measure Difficulty turning over in bed (including adjusting bedclothes, sheets and blankets)?: Unable Difficulty moving from lying on back to sitting on the side of the bed? : Unable Difficulty sitting down on and standing up from a chair with arms (e.g., wheelchair, bedside commode, etc,.)?: Unable Help needed moving to and from a bed to chair (including a wheelchair)?: A Little Help needed walking in hospital room?: A Little Help needed climbing 3-5 steps with a railing? : A Lot 6 Click Score: 11    End of Session Equipment Utilized During Treatment: Gait belt Activity Tolerance: Patient limited by fatigue;Treatment limited secondary to medical  complications (Comment) Patient left: in bed;with call bell/phone within reach;with family/visitor present Nurse Communication: Mobility status PT Visit Diagnosis: Unsteadiness on feet (R26.81);History of falling (Z91.81);Muscle weakness (generalized) (M62.81);Adult, failure to thrive (R62.7)    Time: 9937-1696 PT Time Calculation (min) (ACUTE ONLY): 25 min   Charges:   PT Evaluation $PT Eval Moderate Complexity: 1 Mod     PT G Codes:   PT G-Codes **NOT FOR INPATIENT CLASS** Functional Assessment Tool Used: AM-PAC 6 Clicks Basic Mobility    Ramond Dial 07/25/2017, 9:15 PM   Mee Hives, PT MS Acute Rehab Dept. Number: [place]

## 2017-07-26 LAB — PROTIME-INR
INR: 2
Prothrombin Time: 22.5 seconds — ABNORMAL HIGH (ref 11.4–15.2)

## 2017-07-26 LAB — CBC
HEMATOCRIT: 38 % (ref 35.0–47.0)
Hemoglobin: 11.6 g/dL — ABNORMAL LOW (ref 12.0–16.0)
MCH: 23.6 pg — AB (ref 26.0–34.0)
MCHC: 30.5 g/dL — ABNORMAL LOW (ref 32.0–36.0)
MCV: 77.4 fL — AB (ref 80.0–100.0)
PLATELETS: 265 10*3/uL (ref 150–440)
RBC: 4.91 MIL/uL (ref 3.80–5.20)
RDW: 25 % — ABNORMAL HIGH (ref 11.5–14.5)
WBC: 8.7 10*3/uL (ref 3.6–11.0)

## 2017-07-26 LAB — BASIC METABOLIC PANEL
ANION GAP: 10 (ref 5–15)
BUN: 41 mg/dL — AB (ref 6–20)
CALCIUM: 9.3 mg/dL (ref 8.9–10.3)
CO2: 28 mmol/L (ref 22–32)
CREATININE: 0.85 mg/dL (ref 0.44–1.00)
Chloride: 101 mmol/L (ref 101–111)
GFR calc Af Amer: >60 mL/min (ref >60)
GLUCOSE: 90 mg/dL (ref 65–99)
Potassium: 4.5 mmol/L (ref 3.5–5.1)
Sodium: 139 mmol/L (ref 135–145)

## 2017-07-26 LAB — MAGNESIUM: Magnesium: 1.9 mg/dL (ref 1.7–2.4)

## 2017-07-26 MED ORDER — FUROSEMIDE 20 MG PO TABS: 20.0000 mg | ORAL_TABLET | Freq: Every day | ORAL | Status: DC

## 2017-07-26 NOTE — Plan of Care (Signed)
  Problem: Activity: Goal: Risk for activity intolerance will decrease Outcome: Not Progressing  Still very short of breath with minimal exertion.

## 2017-07-26 NOTE — Progress Notes (Signed)
Patientis a72 y.o.femalewith a known history of emphysema, coronary artery disease, hyperlipidemia, GERD, hypertension, CVA presented to the emergency room for shortness of breath and palpitations.  She is diagnosed with new onset systolic CHF with ejection fraction 25-30%.  Patient is a current, every day smoker.    Patient lying in bed watching television.  Husband at bedside.  Plan is for patient to be discharged to a SNF. Patient's husband reports patient was just discharged from [facility] on June 15, 2017.    CHF Education completed with patient and husband.   ? Reviewed  "Living Better with Heart Failure" booklet with patient and spouse. Briefly reviewed definition of heart failure and signs and symptoms of an exacerbation.  Discussed the meaning of EF and patient's value as compared to normal value.  Explained to patient that HF is a chronic illness which requires self-assessment / self-management along with help from the cardiologist/PCP/HF Clinic.? ?? *Reviewed importance of and reason behind checking weight daily in the AM, after using the bathroom, but before getting dressed.?Patient has set of new scales at home.? Reviewed the following information with patient:  *Discussed when to call the Dr= weight gain of >2-3lb overnight of 5lb in a week,  *Discussed yellow zone= call MD: weight gain of >2-3lb overnight of 5lb in a week, increased swelling, increased SOB when lying down, chest discomfort, dizziness, increased fatigue *Red Zone= call 911: struggle to breath, fainting or near fainting, significant chest pain   *Reviewed low sodium diet-provided handout of recommended and not recommended foods. ?Reviewed reading labels with patient. Discussed fluid intake with patient as well. Patient not currently on a fluid restriction, but advised no more than 8-8 ounces glass of fluids per day.   *Instructed patient to take medications as prescribed for heart failure. Explained briefly why  pt is on the medications (either make you feel better, live longer or keep you out of the hospital) and discussed monitoring and side effects.   Discussed Entresto.  Patient started on Entresto this morning.  Information on Entresto provided.    *Smoking Cessation - Patient is a current every day smoker.?Patient informed this RN that she has now quit smoking.  She reported she is not going to smoke any more.  Patient has Nicoderm patch in place and reports she has not had any cravings for a cigarette since being admitted. Reviewed "Thinking About Quitting, Yes You Can!" informational sheet with patient.    *Discussed the benefits of exercise. Patient being discharged to SNF for STR.  Patient encouraged to remain as active as possible.?After STR and possibly in-home HH PT, patient would be a candidate for Cardiac Rehab.  Explained to patient and significant other that based on patient's EF per Medicare guidelines patient is a candidate for Cardiac Rehab. ?  *[facility]- Explained the role of the [facility]. ?Explained to patient and significant other that the HF Clinic does not replace her PCP/cardiologist, but is an additional resource to help her manage her HF and to keep her out of the hospital.   Appointment in the [facility] is scheduled for 08/03/2017 at 9:00 a.m. Patient encouraged to keep this appointment.  ? Once again reviewed the 5 Steps to Living Better with Heart Failure. ?Patient and husband thanked me for providing the above information. ?  The following physician note was placed in [facility]: Upon discharge to SNF please include the following: 1. NAS Diet 2. Daily Weights 2. Contact physician / cardiologist for  increase in weight of 2-3 pounds in one day or 5 pounds in one week.   Roanna Epley, RN, BSN, [facility] Cardiovascular and Pulmonary Nurse Navigator

## 2017-07-26 NOTE — Plan of Care (Signed)
  Problem: Education: Goal: Knowledge of General Education information will improve Outcome: Progressing   Problem: Safety: Goal: Ability to remain free from injury will improve Outcome: Progressing   

## 2017-07-26 NOTE — Care Management (Signed)
Patient admitted with what appears to be new onset of heart failure.  Has scales at home.Has had a recent month long stay at [facility].  Is currently being followed at home by [facility]. Patient thinks she has nursing, physical therapy and occupational therapy. She would prefer to return home but it it is indicated, she would consider returning to skilled nursing.  Preference is [facility].  Discussed that her insurance would have to approve. Updated CSW

## 2017-07-26 NOTE — Progress Notes (Signed)
SUBJECTIVE: Feeling weak, but no shortness of breath or LE edema.   Vitals:   07/25/17 1227 07/25/17 1918 07/26/17 0446 07/26/17 0736  BP:  (!) 109/50 109/66 105/63  Pulse:  96 92 88  Resp:  18 16 18   Temp:  98 F (36.7 C) 97.8 F (36.6 C) 98.5 F (36.9 C)  TempSrc:  Oral Oral Oral  SpO2:  98% 92% 97%  Weight: 108 lb 11 oz (49.3 kg)  102 lb 9.6 oz (46.5 kg)   Height:        Intake/Output Summary (Last 24 hours) at 07/26/2017 0902 Last data filed at 07/26/2017 0454 Gross per 24 hour  Intake 720 ml  Output 650 ml  Net 70 ml    LABS: Basic Metabolic Panel: Recent Labs    07/25/17 0443 07/26/17 0727  NA 138 139  K 4.1 4.5  CL 105 101  CO2 18* 28  GLUCOSE 84 90  BUN 27* 41*  CREATININE 0.72 0.85  CALCIUM 9.3 9.3  MG 1.8 1.9   Liver Function Tests: Recent Labs    07/24/17 1108  AST 34  ALT 26  ALKPHOS 76  BILITOT 0.8  PROT 7.1  ALBUMIN 3.8   No results for input(s): LIPASE, AMYLASE in the last 72 hours. CBC: Recent Labs    07/24/17 1108  WBC 5.1  NEUTROABS 4.3  HGB 10.1*  HCT 34.0*  MCV 77.0*  PLT 261   Cardiac Enzymes: Recent Labs    07/24/17 1958 07/24/17 2318 07/25/17 0443  TROPONINI <0.03 <0.03 <0.03   BNP: Invalid input(s): POCBNP D-Dimer: No results for input(s): DDIMER in the last 72 hours. Hemoglobin A1C: No results for input(s): HGBA1C in the last 72 hours. Fasting Lipid Panel: No results for input(s): CHOL, HDL, LDLCALC, TRIG, CHOLHDL, LDLDIRECT in the last 72 hours. Thyroid Function Tests: No results for input(s): TSH, T4TOTAL, T3FREE, THYROIDAB in the last 72 hours.  Invalid input(s): FREET3 Anemia Panel: No results for input(s): VITAMINB12, FOLATE, FERRITIN, TIBC, IRON, RETICCTPCT in the last 72 hours.   PHYSICAL EXAM General: Frail HEENT:  Normocephalic and atramatic Neck:  No JVD.  Lungs: Clear bilaterally to auscultation and percussion. Heart: HRRR . Normal S1 and S2 without gallops or murmurs.  Abdomen: Bowel  sounds are positive, abdomen soft and non-tender  Msk:  Back normal, normal gait. Normal strength and tone for age. Extremities: No clubbing, cyanosis or edema.   Neuro: Alert and oriented X 3. Psych:  Good affect, responds appropriately  TELEMETRY: NSR 93bpm  ASSESSMENT AND PLAN: New diagnosis of combined systolic and diastolic CHF. Shortness of breath is much improved, no LE edema, but is very weak. Cleared for discharge from cardiac perspective with close outpatient follow up. Continue Entresto, coreg, and lasix. Advise follow up with [facility] 10am Monday 4/1.  Active Problems:   CHF (congestive heart failure) ([place])    Jake Bathe, NP-C 07/26/2017 9:02 AM Cell: [phone]

## 2017-07-26 NOTE — Clinical Social Work Note (Signed)
CSW met with patient and discussed SNF placement, patient states she would like to go to [place] if she can.  CSW explained to her the process for trying to find SNF placement, and process for getting insurance approval.  Patient gave CSW permission to begin bed search in [facility] formal assessment to follow.  CSW to continue to follow patient's progress throughout discharge planning.  Jones Broom. Norval Morton, MSW, [place]  07/26/2017 6:32 PM

## 2017-07-26 NOTE — Progress Notes (Signed)
Advanced Care Plan.  Purpose of Encounter: CODE STATUS. Parties in Attendance: The patient and me. Patient's Decisional Capacity: Yes. Medical Story:  Michele Matthews  is a 72 y.o. female with a known history of emphysema, coronary artery disease, hyperlipidemia, GERD, hypertension, CVA presented to the emergency room for shortness of breath palpitations.   She is diagnosed with new onset systolic CHF with ejection fraction 25-30%.  She has been treated with Lasix after admission.  I discussed the patient current condition and poor prognosis due to CHF with very low ejection fraction, also discussed CODE STATUS.  The patient want full code for now.  Plan:  Code Status: Full code. Time spent discussing advance care planning: 18 minutes.

## 2017-07-26 NOTE — Progress Notes (Addendum)
[place] [facility] [place] NAME: Michele Matthews    MR#:  [id number]  DATE OF BIRTH:  1945/06/14  SUBJECTIVE:  CHIEF COMPLAINT:   Chief Complaint  Patient presents with  . Shortness of Breath  . Diarrhea   Better shortness of breath and cough.  But has severe generalized weakness. REVIEW OF SYSTEMS:  Review of Systems  Constitutional: Positive for malaise/fatigue. Negative for chills and fever.  HENT: Negative for sore throat.   Eyes: Negative for blurred vision and double vision.  Respiratory: Positive for cough, sputum production and shortness of breath. Negative for hemoptysis, wheezing and stridor.   Cardiovascular: Negative for chest pain, palpitations, orthopnea and leg swelling.  Gastrointestinal: Negative for abdominal pain, blood in stool, diarrhea, melena, nausea and vomiting.  Genitourinary: Negative for dysuria, flank pain and hematuria.  Musculoskeletal: Negative for back pain and joint pain.  Skin: Negative for rash.  Neurological: Negative for dizziness, sensory change, focal weakness, seizures, loss of consciousness, weakness and headaches.  Endo/Heme/Allergies: Negative for polydipsia.  Psychiatric/Behavioral: Negative for depression. The patient is not nervous/anxious.     DRUG ALLERGIES:  No Known Allergies VITALS:  Blood pressure 105/63, pulse 88, temperature 98.5 F (36.9 C), temperature source Oral, resp. rate 18, height 5\' 6"  (1.676 m), weight 102 lb 9.6 oz (46.5 kg), SpO2 97 %. PHYSICAL EXAMINATION:  Physical Exam  Constitutional: She is oriented to person, place, and time.  Severe malnutrition.  HENT:  Head: Normocephalic.  Mouth/Throat: Oropharynx is clear and moist.  Eyes: Pupils are equal, round, and reactive to light. Conjunctivae and EOM are normal. No scleral icterus.  Neck: Normal range of motion. Neck supple. No JVD present. No tracheal deviation present.  Cardiovascular: Normal rate, regular rhythm and  normal heart sounds. Exam reveals no gallop.  No murmur heard. Pulmonary/Chest: Effort normal. No respiratory distress. She has no wheezes. She has no rales.  Abdominal: Soft. Bowel sounds are normal. She exhibits no distension. There is no tenderness. There is no rebound.  Musculoskeletal: Normal range of motion. She exhibits no edema or tenderness.  Neurological: She is alert and oriented to person, place, and time. No cranial nerve deficit.  Skin: No rash noted. No erythema.  Psychiatric: Affect normal.   LABORATORY PANEL:  Female CBC Recent Labs  Lab 07/26/17 0839  WBC 8.7  HGB 11.6*  HCT 38.0  PLT 265   ------------------------------------------------------------------------------------------------------------------ Chemistries  Recent Labs  Lab 07/24/17 1108  07/26/17 0727  NA 140   < > 139  K 3.8   < > 4.5  CL 110   < > 101  CO2 22   < > 28  GLUCOSE 107*   < > 90  BUN 23*   < > 41*  CREATININE 0.72   < > 0.85  CALCIUM 9.3   < > 9.3  MG  --    < > 1.9  AST 34  --   --   ALT 26  --   --   ALKPHOS 76  --   --   BILITOT 0.8  --   --    < > = values in this interval not displayed.   RADIOLOGY:  No results found. ASSESSMENT AND PLAN:   72 year old female patient with history of coronary artery disease, emphysema, hyperlipidemia, hypertension on Coumadin for anticoagulation as outpatient with INR being therapeutic presented to the emergency room for shortness of breath and tachycardia CTA chest no pulmonary embolism.  1.  New onset acute systolic congestive heart failure, EF 25-30% The patient has been treated with IV Lasix, which was decreased to 20 mg every 12 hourly for diuresis due to elevated BUN.  Changed to 20 mg p.o. daily due to worsening BUN. Echocardiogram:  EF 25-30%. Per Dr. Humphrey Rolls, started [place] in place of Lisinopril, first dose 07/26/17 due to 36hr wash out from converting from ACE. Continue lasix and coreg. Will plan to advance coreg in outpatient  setting.  2.  Pleural effusions and fluid overload IV Lasix for diuresis  3. Coronary artery disease Resumed statin and beta-blocker  Resumed Coumadin for anticoagulation, PTD.  4.  DVT prophylaxis Continue oral Coumadin INR therapeutic  5.Tobacco cessation counseled to patient for six minutes Nicotine patch offered  Severe malnutrition.  Dietitian consult.  Dehydration.  Due to Lasix.  Decrease the dose and follow-up BMP.  Generalized weakness.  PT evaluation suggest SNF. PT reevaluation in 1-2 days, hopefully the patient become stronger enough to go home with home health and PT.  All the records are reviewed and case discussed with Care Management/Social Worker. Management plans discussed with the patient, family and they are in agreement.  CODE STATUS: Full Code  TOTAL TIME TAKING CARE OF THIS PATIENT: 28 minutes.   More than 50% of the time was spent in counseling/coordination of care: YES  POSSIBLE D/C IN 1-2 DAYS, DEPENDING ON CLINICAL CONDITION.   Demetrios Loll M.D on 07/26/2017 at 3:01 PM  Between 7am to 6pm - Pager - [phone]  After 6pm go to [email or web address] - [facility]

## 2017-07-26 NOTE — Progress Notes (Signed)
ANTICOAGULATION CONSULT NOTE - Initial Consult  Pharmacy Consult for warfarin Indication: VTE treatment  No Known Allergies  Patient Measurements: Height: 5\' 6"  (167.6 cm) Weight: 102 lb 9.6 oz (46.5 kg) IBW/kg (Calculated) : 59.3 Heparin Dosing Weight:   Vital Signs: Temp: 98.5 F (36.9 C) (03/27 0736) Temp Source: Oral (03/27 0736) BP: 105/63 (03/27 0736) Pulse Rate: 88 (03/27 0736)  Labs: Recent Labs    07/24/17 1108 07/24/17 1227 07/24/17 1958 07/24/17 2318 07/25/17 0443 07/25/17 0630 07/26/17 0727 07/26/17 0839  HGB 10.1*  --   --   --   --   --   --  11.6*  HCT 34.0*  --   --   --   --   --   --  38.0  PLT 261  --   --   --   --   --   --  265  LABPROT  --  24.4*  --   --   --  24.2*  --  22.5*  INR  --  2.22  --   --   --  2.19  --  2.00  CREATININE 0.72  --   --   --  0.72  --  0.85  --   TROPONINI <0.03  --  <0.03 <0.03 <0.03  --   --   --     Estimated Creatinine Clearance: 43.3 mL/min (by C-G formula based on SCr of 0.85 mg/dL).   Medical History: Past Medical History:  Diagnosis Date  . Allergy   . COPD (chronic obstructive pulmonary disease) ([place])   . Coronary artery disease   . Depression   . Emphysema of lung ([employer])   . GERD (gastroesophageal reflux disease)   . Hyperlipidemia   . Hypertension   . Seizures ([place])   . Stroke [facility])     Medications:  Infusions:  . sodium chloride      Assessment: 72 yof cc SOB/diarrhea with PMH emphysema, CAD, HLD, GERD, HTN, CVA, COPD, allergy, depression, seizure, stroke. Patient takes VKA PTA for hx CVA and her INR is therapeutic on admission. Pharmacy consulted to dose VKA for VTE treatment. CTA with no PE.   DATE INR DOSE 3/26 2.19 2.5 mg 3/27 2 5  mg  Goal of Therapy:  INR 2-3 Monitor platelets by anticoagulation protocol: Yes   Plan:  Continue PTA warfarin dosing - 5 mg po on Mondays, Wednesdays, and Fridays; 2.5 mg po on Tuesdays, Thursdays, Saturdays, and Sundays. Will monitor INR daily d/t  drug-disease interaction with CHF and adjust as needed to maintain INR 2 to 3.    Laural Benes, Pharm.D., BCPS Clinical Pharmacist 07/26/2017,11:24 AM

## 2017-07-27 LAB — BASIC METABOLIC PANEL
Anion gap: 11 (ref 5–15)
BUN: 53 mg/dL — AB (ref 6–20)
CO2: 26 mmol/L (ref 22–32)
Calcium: 9.3 mg/dL (ref 8.9–10.3)
Chloride: 102 mmol/L (ref 101–111)
Creatinine, Ser: 0.74 mg/dL (ref 0.44–1.00)
GFR calc Af Amer: >60 mL/min (ref >60)
GFR calc non Af Amer: >60 mL/min (ref >60)
GLUCOSE: 92 mg/dL (ref 65–99)
POTASSIUM: 3.8 mmol/L (ref 3.5–5.1)
Sodium: 139 mmol/L (ref 135–145)

## 2017-07-27 LAB — PROTIME-INR
INR: 2.09
Prothrombin Time: 23.3 seconds — ABNORMAL HIGH (ref 11.4–15.2)

## 2017-07-27 MED ORDER — FUROSEMIDE 20 MG PO TABS: 20.0000 mg | ORAL_TABLET | Freq: Every day | ORAL | 1 refills | Status: DC

## 2017-07-27 MED ORDER — ENSURE ENLIVE PO LIQD: 237.0000 mL | Freq: Three times a day (TID) | ORAL | 12 refills | Status: DC

## 2017-07-27 MED ORDER — CARVEDILOL 3.125 MG PO TABS: 3.1250 mg | ORAL_TABLET | Freq: Two times a day (BID) | ORAL | 2 refills | Status: DC

## 2017-07-27 NOTE — Discharge Summary (Signed)
[place] at [place] NAME: Michele Matthews    MR#:  [id number]  DATE OF BIRTH:  1945/09/29  DATE OF ADMISSION:  07/24/2017   ADMITTING PHYSICIAN: Saundra Shelling, MD  DATE OF DISCHARGE: 07/27/17  PRIMARY CARE PHYSICIAN: No primary care provider on file.   ADMISSION DIAGNOSIS:   Acute congestive heart failure, unspecified heart failure type ([place]) [I50.9]  DISCHARGE DIAGNOSIS:   Active Problems:   CHF (congestive heart failure) ([place])   SECONDARY DIAGNOSIS:   Past Medical History:  Diagnosis Date  . Allergy   . COPD (chronic obstructive pulmonary disease) ([place])   . Coronary artery disease   . Depression   . Emphysema of lung ([place])   . GERD (gastroesophageal reflux disease)   . Hyperlipidemia   . Hypertension   . Seizures ([place])   . Stroke [facility])     HOSPITAL COURSE:   72 year old female with known history of CAD, history of stroke on warfarin, GERD, hypertension, emphysema  Not on home oxygen  Since hospital secondary to shortness of breath and palpitations.  1. Acute  Systolic CHF-new diagnosis of CHF, EF of 86-57% with diastolic dysfunction and mitral regurgitation -Appreciate cardiology consult. -Lisinopril wash out done and started on entresto, however blood pressure is very low- discontinue entresto at discharge and can be started as outpatient -Received IV diuresis, back to baseline weight. Discharge on low-dose Lasix -Also started on Coreg -follow-up with cardiology as outpatient   2. History of strokes-  Stable now. No focal deficits. -Continue Coumadin   3. Anxiety- buspar  4. GERD - PPI   Patient is extremely weak, PT recommended rehabilitation    DISCHARGE CONDITIONS:   Guarded  CONSULTS OBTAINED:   Treatment Team:  Dionisio David, MD  DRUG ALLERGIES:   No Known Allergies DISCHARGE MEDICATIONS:   Allergies as of 07/27/2017   No Known Allergies     Medication List    STOP taking these  medications   albuterol 108 (90 Base) MCG/ACT inhaler Commonly known as:  PROVENTIL HFA;VENTOLIN HFA   cephALEXin 500 MG capsule Commonly known as:  KEFLEX   Fluticasone-Salmeterol 250-50 MCG/DOSE Aepb Commonly known as:  ADVAIR   metoprolol tartrate 25 MG tablet Commonly known as:  LOPRESSOR   tiotropium 18 MCG inhalation capsule Commonly known as:  SPIRIVA   triamcinolone cream 0.5 % Commonly known as:  KENALOG     TAKE these medications   aspirin EC 81 MG tablet Take 81 mg by mouth daily.   atorvastatin 10 MG tablet Commonly known as:  LIPITOR TAKE 1 TABLET EVERY DAY   busPIRone 7.5 MG tablet Commonly known as:  BUSPAR Take 1 tablet (7.5 mg total) by mouth 2 (two) times daily.   carvedilol 3.125 MG tablet Commonly known as:  COREG Take 1 tablet (3.125 mg total) by mouth 2 (two) times daily with a meal.   diphenhydramine-acetaminophen 25-500 MG Tabs tablet Commonly known as:  TYLENOL PM Take 1 tablet by mouth at bedtime as needed.   feeding supplement (ENSURE ENLIVE) Liqd Take 237 mLs by mouth 3 (three) times daily between meals.   furosemide 20 MG tablet Commonly known as:  LASIX Take 1 tablet (20 mg total) by mouth daily.   hydrOXYzine 10 MG tablet Commonly known as:  ATARAX/VISTARIL Take 1 tablet (10 mg total) by mouth 2 (two) times daily as needed for anxiety (sleep).   multivitamin tablet Take 1 tablet by mouth daily.   pantoprazole  40 MG tablet Commonly known as:  PROTONIX TAKE 1 TABLET EVERY DAY   sertraline 100 MG tablet Commonly known as:  ZOLOFT Take 1 tablet (100 mg total) by mouth 2 (two) times daily.   VISINE OP Place 1 drop into both eyes 2 (two) times daily as needed (dry eyes).   warfarin 5 MG tablet Commonly known as:  COUMADIN TAKE 1 TABLET ON THURSDAYS & SUNDAYS AND TAKE 1/2 TABLET ON MONDAYS, TUESDAYS, WEDNESDAYS,  FRIDAYS & SATURDAYS What changed:    how much to take  how to take this  when to take this  additional  instructions        DISCHARGE INSTRUCTIONS:   1. Low sodium Diet 2. Daily Weights -. Contact physician / cardiologist for increase in weight of 2-3 pounds in one day or 5 pounds in one week. 3. PCP f/u in 1 week  DIET:   Cardiac diet  ACTIVITY:   Activity as tolerated  OXYGEN:   Home Oxygen: No.  Oxygen Delivery: room air  DISCHARGE LOCATION:   nursing home   If you experience worsening of your admission symptoms, develop shortness of breath, life threatening emergency, suicidal or homicidal thoughts you must seek medical attention immediately by calling 911 or calling your MD immediately  if symptoms less severe.  You Must read complete instructions/literature along with all the possible adverse reactions/side effects for all the Medicines you take and that have been prescribed to you. Take any new Medicines after you have completely understood and accpet all the possible adverse reactions/side effects.   Please note  You were cared for by a hospitalist during your hospital stay. If you have any questions about your discharge medications or the care you received while you were in the hospital after you are discharged, you can call the unit and asked to speak with the hospitalist on call if the hospitalist that took care of you is not available. Once you are discharged, your primary care physician will handle any further medical issues. Please note that NO REFILLS for any discharge medications will be authorized once you are discharged, as it is imperative that you return to your primary care physician (or establish a relationship with a primary care physician if you do not have one) for your aftercare needs so that they can reassess your need for medications and monitor your lab values.    On the day of Discharge:  VITAL SIGNS:   Blood pressure (!) 94/48, pulse 90, temperature 98.4 F (36.9 C), temperature source Oral, resp. rate (!) 24, height 5\' 6"  (1.676 m), weight  43.8 kg (96 lb 9.6 oz), SpO2 98 %.  PHYSICAL EXAMINATION:    GENERAL:  72 y.o.-year-old ill nourished elderly appearing patient lying in the bed with no acute distress.  EYES: Pupils equal, round, reactive to light and accommodation. No scleral icterus. Extraocular muscles intact.  HEENT: Head atraumatic, normocephalic. Oropharynx and nasopharynx clear.  NECK:  Supple, no jugular venous distention. No thyroid enlargement, no tenderness.  LUNGS: Normal breath sounds bilaterally, no wheezing, rales,rhonchi or crepitation. No use of accessory muscles of respiration. Decreased bibasilar breath sounds CARDIOVASCULAR: S1, S2 normal. No  rubs, or gallops. 3/6 systolic murmur present ABDOMEN: Soft, nontender, nondistended. Bowel sounds present. No organomegaly or mass.  EXTREMITIES: No pedal edema, cyanosis, or clubbing.  NEUROLOGIC: Cranial nerves II through XII are intact. Muscle strength 5/5 in all extremities. Sensation intact. Gait not checked.  PSYCHIATRIC: The patient is alert and oriented x 3.  Global weakness SKIN: No obvious rash, lesion, or ulcer.     DATA REVIEW:   CBC Recent Labs  Lab 07/26/17 0839  WBC 8.7  HGB 11.6*  HCT 38.0  PLT 265    Chemistries  Recent Labs  Lab 07/24/17 1108  07/26/17 0727 07/27/17 0534  NA 140   < > 139 139  K 3.8   < > 4.5 3.8  CL 110   < > 101 102  CO2 22   < > 28 26  GLUCOSE 107*   < > 90 92  BUN 23*   < > 41* 53*  CREATININE 0.72   < > 0.85 0.74  CALCIUM 9.3   < > 9.3 9.3  MG  --    < > 1.9  --   AST 34  --   --   --   ALT 26  --   --   --   ALKPHOS 76  --   --   --   BILITOT 0.8  --   --   --    < > = values in this interval not displayed.     Microbiology Results  Results for orders placed or performed during the hospital encounter of 04/28/17  Urine culture     Status: Abnormal   Collection Time: 04/28/17  4:20 PM  Result Value Ref Range Status   Specimen Description   Final    URINE, RANDOM Performed at [facility], [address], [place], [place] [address]    Special Requests   Final    NONE Performed at [facility], [address] [place], [place] [address]    Culture >=100,000 COLONIES/mL ESCHERICHIA COLI (A)  Final   Report Status 05/01/2017 FINAL  Final   Organism ID, Bacteria ESCHERICHIA COLI (A)  Final      Susceptibility   Escherichia coli - MIC*    AMPICILLIN 16 INTERMEDIATE Intermediate     CEFAZOLIN 8 SENSITIVE Sensitive     CEFTRIAXONE <=1 SENSITIVE Sensitive     CIPROFLOXACIN <=0.25 SENSITIVE Sensitive     GENTAMICIN <=1 SENSITIVE Sensitive     IMIPENEM <=0.25 SENSITIVE Sensitive     NITROFURANTOIN <=16 SENSITIVE Sensitive     TRIMETH/SULFA <=20 SENSITIVE Sensitive     AMPICILLIN/SULBACTAM 4 SENSITIVE Sensitive     PIP/TAZO <=4 SENSITIVE Sensitive     Extended ESBL NEGATIVE Sensitive     * >=100,000 COLONIES/mL ESCHERICHIA COLI    RADIOLOGY:  No results found.   Management plans discussed with the patient, family and they are in agreement.  CODE STATUS:     Code Status Orders  (From admission, onward)        Start     Ordered   07/24/17 1648  Full code  Continuous     07/24/17 1647    Code Status History    Date Active Date Inactive Code Status Order ID Comments User Context   12/27/2015 1829 12/31/2015 1621 Full Code [id number]  Reubin Milan, MD Inpatient   10/26/2015 1532 10/28/2015 1639 Full Code [id number]  Hillary Bow, MD ED   08/09/2015 0415 08/11/2015 1816 Full Code [id number]  Harrie Foreman, MD ED   12/12/2014 1434 12/17/2014 0057 DNR [id number]  Max Sane, MD Inpatient   12/10/2014 1538 12/12/2014 1434 Full Code [id number]  Vaughan Basta, MD Inpatient      TOTAL TIME TAKING CARE OF THIS PATIENT: 38 minutes.    Gladstone Lighter M.D on 07/27/2017  at 2:53 PM  Between 7am to 6pm - Pager - [phone]  After 6pm go to [email or web address] - [employer] [place] [facility]  Office   [phone]  CC: Primary care physician; No primary care provider on file.   Note: This dictation was prepared with Dragon dictation along with smaller phrase technology. Any transcriptional errors that result from this process are unintentional.

## 2017-07-27 NOTE — NC FL2 (Signed)
[place] LEVEL OF CARE SCREENING TOOL     IDENTIFICATION  Patient Name: Michele Matthews Birthdate: 11/06/1945 Sex: female Admission Date (Current Location): 07/24/2017  [place] and [place] Number:  Engineering geologist and Address:  [facility], [address] [place], [place] [address]      Provider Number: [id number]  Attending Physician Name and Address:  Gladstone Lighter, MD  Relative Name and Phone Number:  Talena, Neira [phone] or Anderson,Shelia Daughter [phone]     Current Level of Care: Hospital Recommended Level of Care: [place] Prior Approval Number:    Date Approved/Denied:   PASRR Number: [id number]  Discharge Plan: SNF    Current Diagnoses: Patient Active Problem List   Diagnosis Date Noted  . CHF (congestive heart failure) ([place]) 07/24/2017  . Severe protein-calorie malnutrition ([place]) 04/01/2016  . Anemia, iron deficiency   . Pressure ulcer 12/27/2015  . Essential hypertension 12/27/2015  . Anemia 10/26/2015  . COPD (chronic obstructive pulmonary disease) ([place]) 06/19/2015  . Coronary artery disease 06/19/2015  . GERD (gastroesophageal reflux disease) 06/19/2015  . Hyperlipidemia, unspecified 06/19/2015  . Long term current use of anticoagulant therapy 06/19/2015  . Anxiety and depression 06/19/2015  . Symptomatic anemia 12/10/2014  . Upper GI bleed   . History of CVA with residual deficit 12/10/2013  . Hemianopsia 12/10/2013  . Left-sided weakness 12/10/2013  . Tobacco abuse 12/10/2013    Orientation RESPIRATION BLADDER Height & Weight     Self, Time, Situation, Place  Normal Incontinent Weight: 96 lb 9.6 oz (43.8 kg) Height:  5\' 6"  (167.6 cm)  BEHAVIORAL SYMPTOMS/MOOD NEUROLOGICAL BOWEL NUTRITION STATUS      Continent Diet(Regular diet)  AMBULATORY STATUS COMMUNICATION OF NEEDS Skin   Limited Assist Verbally Normal                       Personal Care  Assistance Level of Assistance  Bathing, Feeding, Dressing Bathing Assistance: Limited assistance Feeding assistance: Independent Dressing Assistance: Limited assistance     Functional Limitations Info  Sight, Hearing, Speech Sight Info: Adequate Hearing Info: Adequate Speech Info: Adequate    SPECIAL CARE FACTORS FREQUENCY  PT (By licensed PT)     PT Frequency: 5x a week              Contractures Contractures Info: Not present    Additional Factors Info  Code Status, Allergies, Psychotropic Code Status Info: Full code Allergies Info: NKA Psychotropic Info: busPIRone (BUSPAR) tablet 7.5 mg and sertraline (ZOLOFT) tablet 100 mg          Current Medications (07/27/2017):  This is the current hospital active medication list Current Facility-Administered Medications  Medication Dose Route Frequency Provider Last Rate Last Dose  . 0.9 %  sodium chloride infusion  250 mL Intravenous PRN Pyreddy, Reatha Harps, MD      . acetaminophen (TYLENOL) tablet 650 mg  650 mg Oral Q4H PRN Saundra Shelling, MD   650 mg at 07/26/17 1703  . aspirin EC tablet 81 mg  81 mg Oral Daily Saundra Shelling, MD   81 mg at 07/27/17 0837  . atorvastatin (LIPITOR) tablet 10 mg  10 mg Oral Daily Pyreddy, Reatha Harps, MD   10 mg at 07/27/17 1937  . busPIRone (BUSPAR) tablet 7.5 mg  7.5 mg Oral BID Saundra Shelling, MD   7.5 mg at 07/27/17 0840  . carvedilol (COREG) tablet 3.125 mg  3.125 mg Oral BID WC Demetrios Loll, MD  3.125 mg at 07/26/17 1703  . feeding supplement (ENSURE ENLIVE) (ENSURE ENLIVE) liquid 237 mL  237 mL Oral TID BM Demetrios Loll, MD   237 mL at 07/27/17 0844  . hydrOXYzine (ATARAX/VISTARIL) tablet 10 mg  10 mg Oral BID PRN Saundra Shelling, MD   10 mg at 07/24/17 2116  . multivitamin with minerals tablet 1 tablet  1 tablet Oral Daily Demetrios Loll, MD   1 tablet at 07/27/17 0837  . nicotine (NICODERM CQ - dosed in mg/24 hours) patch 21 mg  21 mg Transdermal Daily Saundra Shelling, MD   21 mg at 07/27/17 4128  .  ondansetron (ZOFRAN) injection 4 mg  4 mg Intravenous Q6H PRN Pyreddy, Pavan, MD      . pantoprazole (PROTONIX) EC tablet 40 mg  40 mg Oral Daily Pyreddy, Reatha Harps, MD   40 mg at 07/27/17 0837  . sertraline (ZOLOFT) tablet 100 mg  100 mg Oral BID Saundra Shelling, MD   100 mg at 07/27/17 0837  . sodium chloride flush (NS) 0.9 % injection 3 mL  3 mL Intravenous Q12H Pyreddy, Reatha Harps, MD   3 mL at 07/27/17 0845  . sodium chloride flush (NS) 0.9 % injection 3 mL  3 mL Intravenous PRN Saundra Shelling, MD   3 mL at 07/26/17 0646  . tetrahydrozoline 0.05 % ophthalmic solution 1 drop  1 drop Both Eyes BID PRN Pyreddy, Reatha Harps, MD      . warfarin (COUMADIN) tablet 5 mg  5 mg Oral Once per day on Mon Wed Fri Chen, Qing, MD   5 mg at 07/26/17 1703   And  . warfarin (COUMADIN) tablet 2.5 mg  2.5 mg Oral Once per day on Sun Tue Thu Sat Demetrios Loll, MD   2.5 mg at 07/25/17 1722  . Warfarin - Pharmacist Dosing Inpatient   Does not apply q1800 Demetrios Loll, MD         Discharge Medications: Please see discharge summary for a list of discharge medications.  Relevant Imaging Results:  Relevant Lab Results:   Additional Information SSN: [id number]  Anell Barr

## 2017-07-27 NOTE — Progress Notes (Signed)
ANTICOAGULATION CONSULT NOTE - Initial Consult  Pharmacy Consult for warfarin Indication: VTE treatment  No Known Allergies  Patient Measurements: Height: 5\' 6"  (167.6 cm) Weight: 96 lb 9.6 oz (43.8 kg) IBW/kg (Calculated) : 59.3 Heparin Dosing Weight:   Vital Signs: Temp: 98.4 F (36.9 C) (03/28 0730) Temp Source: Oral (03/28 0730) BP: 88/57 (03/28 0730) Pulse Rate: 90 (03/28 0730)  Labs: Recent Labs    07/24/17 1108  07/24/17 1958 07/24/17 2318 07/25/17 0443 07/25/17 0630 07/26/17 0727 07/26/17 0839 07/27/17 0534  HGB 10.1*  --   --   --   --   --   --  11.6*  --   HCT 34.0*  --   --   --   --   --   --  38.0  --   PLT 261  --   --   --   --   --   --  265  --   LABPROT  --    < >  --   --   --  24.2*  --  22.5* 23.3*  INR  --    < >  --   --   --  2.19  --  2.00 2.09  CREATININE 0.72  --   --   --  0.72  --  0.85  --  0.74  TROPONINI <0.03  --  <0.03 <0.03 <0.03  --   --   --   --    < > = values in this interval not displayed.    Estimated Creatinine Clearance: 43.3 mL/min (by C-G formula based on SCr of 0.74 mg/dL).   Medical History: Past Medical History:  Diagnosis Date  . Allergy   . COPD (chronic obstructive pulmonary disease) ([place])   . Coronary artery disease   . Depression   . Emphysema of lung ([place])   . GERD (gastroesophageal reflux disease)   . Hyperlipidemia   . Hypertension   . Seizures ([place])   . Stroke [facility])     Medications:  Infusions:  . sodium chloride      Assessment: 72 yof cc SOB/diarrhea with PMH emphysema, CAD, HLD, GERD, HTN, CVA, COPD, allergy, depression, seizure, stroke. Patient takes VKA PTA for hx CVA and her INR is therapeutic on admission. Pharmacy consulted to dose VKA for VTE treatment. CTA with no PE.   DATE INR DOSE 3/26 2.19 2.5 mg 3/27 2 5  mg 3/28 2.09  Goal of Therapy:  INR 2-3 Monitor platelets by anticoagulation protocol: Yes   Plan:  Continue PTA warfarin dosing - 5 mg po on Mondays, Wednesdays, and  Fridays; 2.5 mg po on Tuesdays, Thursdays, Saturdays, and Sundays. Will monitor INR daily d/t drug-disease interaction with CHF and adjust as needed to maintain INR 2 to 3.    Laural Benes, Pharm.D., BCPS Clinical Pharmacist 07/27/2017,7:42 AM

## 2017-07-27 NOTE — Clinical Social Work Note (Signed)
CSW awaiting insurance authorization, plan for her to discharge to [facility] once insurance has been received.  Jones Broom. [place], MSW, [place]  07/27/2017 5:08 PM

## 2017-07-27 NOTE — Progress Notes (Signed)
SUBJECTIVE: Feeling weak but no chest pain or shortness of breath.   Vitals:   07/26/17 1703 07/26/17 1939 07/27/17 0435 07/27/17 0730  BP:  (!) 98/56 (!) 99/46 (!) 88/57  Pulse: 98 87 90 90  Resp:  18 16 (!) 24  Temp:  98.3 F (36.8 C) 98 F (36.7 C) 98.4 F (36.9 C)  TempSrc:  Oral Oral Oral  SpO2:  96% 98% 98%  Weight:   96 lb 9.6 oz (43.8 kg)   Height:        Intake/Output Summary (Last 24 hours) at 07/27/2017 0839 Last data filed at 07/26/2017 2300 Gross per 24 hour  Intake 600 ml  Output 400 ml  Net 200 ml    LABS: Basic Metabolic Panel: Recent Labs    07/25/17 0443 07/26/17 0727 07/27/17 0534  NA 138 139 139  K 4.1 4.5 3.8  CL 105 101 102  CO2 18* 28 26  GLUCOSE 84 90 92  BUN 27* 41* 53*  CREATININE 0.72 0.85 0.74  CALCIUM 9.3 9.3 9.3  MG 1.8 1.9  --    Liver Function Tests: Recent Labs    07/24/17 1108  AST 34  ALT 26  ALKPHOS 76  BILITOT 0.8  PROT 7.1  ALBUMIN 3.8   No results for input(s): LIPASE, AMYLASE in the last 72 hours. CBC: Recent Labs    07/24/17 1108 07/26/17 0839  WBC 5.1 8.7  NEUTROABS 4.3  --   HGB 10.1* 11.6*  HCT 34.0* 38.0  MCV 77.0* 77.4*  PLT 261 265   Cardiac Enzymes: Recent Labs    07/24/17 1958 07/24/17 2318 07/25/17 0443  TROPONINI <0.03 <0.03 <0.03   BNP: Invalid input(s): POCBNP D-Dimer: No results for input(s): DDIMER in the last 72 hours. Hemoglobin A1C: No results for input(s): HGBA1C in the last 72 hours. Fasting Lipid Panel: No results for input(s): CHOL, HDL, LDLCALC, TRIG, CHOLHDL, LDLDIRECT in the last 72 hours. Thyroid Function Tests: No results for input(s): TSH, T4TOTAL, T3FREE, THYROIDAB in the last 72 hours.  Invalid input(s): FREET3 Anemia Panel: No results for input(s): VITAMINB12, FOLATE, FERRITIN, TIBC, IRON, RETICCTPCT in the last 72 hours.   PHYSICAL EXAM General: Cachetic, frail, weak HEENT:  Normocephalic and atramatic Neck:  No JVD.  Lungs: Clear bilaterally to  auscultation and percussion. Heart: HRRR . Normal S1 and S2 without gallops or murmurs.  Abdomen: Bowel sounds are positive, abdomen soft and non-tender  Msk:  Back normal, normal gait. Normal strength and tone for age. Extremities: No clubbing, cyanosis or edema.   Neuro: Alert and oriented X 3. Psych:  Good affect, responds appropriately  TELEMETRY: NSR 80bpm  ASSESSMENT AND PLAN: Newly diagnosed combined systolic and diastolic CHF. Shortness of breath is much improved, no LE edema, but is weak. Cleared for discharge from cardiac perspective with close outpatient follow up. Continue Entresto, coreg, and lasix. Blood pressure is low but pt does not report dizziness. May have to hold lasix or Entresto if she is symptomatic with low blood pressure. Follow up given with Ramona Monday 4/1.    Active Problems:   CHF (congestive heart failure) ([place])    Jake Bathe, NP-C 07/27/2017 8:39 AM Cell: [phone]

## 2017-07-27 NOTE — Progress Notes (Signed)
MD Tressia Miners was notified that bp is 88/57, asymptomatic. Hold all BP meds and lasix.

## 2017-07-27 NOTE — Progress Notes (Signed)
Physical Therapy Treatment Patient Details Name: Michele Matthews MRN: [id number] DOB: March 14, 1946 Today's Date: 07/27/2017    History of Present Illness 72 yo female with onset of SOB and CHF with EF 25-30% was admitted for rehab eval and treatment for susp PE.  Cleared PE, noted L1 compression fracture, fluid overload, cardiomegaly.  Has PMHx:  stroke, seizure, emphysema, CAD, COPD, HTN, smoker,     PT Comments     Chart reviewed.  BP noted to be low this am.  Retaken prior to session.  94/48 P 90 O2 98% in supine.  Pt stated she feels good but feels "Shakey".  Ready to try session.  To edge of bed with min guard.  She was able to stand to walker with min guard and verbal cues.  Less R lean noted this session.  Pt stated flexed posture is her baseline.  She was able to march in place x 7 but had decreased step height and was hesitant with task.  She fatigued quickly and returned to sitting.  She required min guard to return to supine and min assist to reposition up in bed.  SOB noted but sats remained high 90's.  Discussed with RN.     Follow Up Recommendations  SNF     Equipment Recommendations  Rolling walker with 5" wheels    Recommendations for Other Services       Precautions / Restrictions Precautions Precautions: Fall Restrictions Weight Bearing Restrictions: No    Mobility  Bed Mobility Overal bed mobility: Needs Assistance Bed Mobility: Supine to Sit;Sit to Supine     Supine to sit: Supervision Sit to supine: Supervision   General bed mobility comments: help to direct and for safety  Transfers Overall transfer level: Needs assistance Equipment used: Rolling walker (2 wheeled);1 person hand held assist Transfers: Sit to/from Stand Sit to Stand: Min guard;Min assist            Ambulation/Gait     Assistive device: Rolling walker (2 wheeled)           Stairs            Wheelchair Mobility    Modified Rankin (Stroke Patients Only)        Balance Overall balance assessment: Needs assistance;History of Falls Sitting-balance support: Feet supported Sitting balance-Leahy Scale: Fair     Standing balance support: Bilateral upper extremity supported;During functional activity Standing balance-Leahy Scale: Poor                              Cognition Arousal/Alertness: Awake/alert Behavior During Therapy: WFL for tasks assessed/performed Overall Cognitive Status: Within Functional Limits for tasks assessed                                        Exercises Other Exercises Other Exercises: marching in place with walker x 7 with very low step height.      General Comments        Pertinent Vitals/Pain Pain Assessment: No/denies pain    Home Living                      Prior Function            PT Goals (current goals can now be found in the care plan section) Progress towards PT goals: Progressing toward goals  Frequency    Min 2X/week      PT Plan Current plan remains appropriate    Co-evaluation              AM-PAC PT "6 Clicks" Daily Activity  Outcome Measure  Difficulty turning over in bed (including adjusting bedclothes, sheets and blankets)?: A Little Difficulty moving from lying on back to sitting on the side of the bed? : A Little Difficulty sitting down on and standing up from a chair with arms (e.g., wheelchair, bedside commode, etc,.)?: Unable Help needed moving to and from a bed to chair (including a wheelchair)?: A Little Help needed walking in hospital room?: A Little Help needed climbing 3-5 steps with a railing? : Total 6 Click Score: 14    End of Session Equipment Utilized During Treatment: Gait belt Activity Tolerance: Patient limited by fatigue;Treatment limited secondary to medical complications (Comment) Patient left: in bed;with call bell/phone within reach;with family/visitor present;with bed alarm set Nurse Communication: Other  (comment)       Time: 8984-2103 PT Time Calculation (min) (ACUTE ONLY): 21 min  Charges:  $Therapeutic Exercise: 8-22 mins                    G Codes:       Chesley Noon, PTA 07/27/17, 1:17 PM

## 2017-07-27 NOTE — Progress Notes (Signed)
[place] [facility] [place] NAME: Michele Matthews    MR#:  [id number]  DATE OF BIRTH:  09/04/1945  SUBJECTIVE:  CHIEF COMPLAINT:   Chief Complaint  Patient presents with  . Shortness of Breath  . Diarrhea   - feels weak, BP is low  REVIEW OF SYSTEMS:  Review of Systems  Constitutional: Positive for malaise/fatigue. Negative for chills and fever.  HENT: Negative for congestion, ear discharge, hearing loss and nosebleeds.   Respiratory: Negative for cough, shortness of breath and wheezing.   Cardiovascular: Negative for chest pain and palpitations.  Gastrointestinal: Negative for abdominal pain, constipation, diarrhea, nausea and vomiting.  Genitourinary: Negative for dysuria and urgency.  Musculoskeletal: Positive for myalgias.  Neurological: Negative for dizziness, focal weakness, seizures, weakness and headaches.  Psychiatric/Behavioral: Negative for depression.    DRUG ALLERGIES:  No Known Allergies  VITALS:  Blood pressure (!) 87/60, pulse 90, temperature 98.4 F (36.9 C), temperature source Oral, resp. rate (!) 24, height 5\' 6"  (1.676 m), weight 43.8 kg (96 lb 9.6 oz), SpO2 98 %.  PHYSICAL EXAMINATION:  Physical Exam  GENERAL:  72 y.o.-year-old ill nourished elderly appearing patient lying in the bed with no acute distress.  EYES: Pupils equal, round, reactive to light and accommodation. No scleral icterus. Extraocular muscles intact.  HEENT: Head atraumatic, normocephalic. Oropharynx and nasopharynx clear.  NECK:  Supple, no jugular venous distention. No thyroid enlargement, no tenderness.  LUNGS: Normal breath sounds bilaterally, no wheezing, rales,rhonchi or crepitation. No use of accessory muscles of respiration. Decreased bibasilar breath sounds CARDIOVASCULAR: S1, S2 normal. No  rubs, or gallops. 3/6 systolic murmur present ABDOMEN: Soft, nontender, nondistended. Bowel sounds present. No organomegaly or mass.  EXTREMITIES: No  pedal edema, cyanosis, or clubbing.  NEUROLOGIC: Cranial nerves II through XII are intact. Muscle strength 5/5 in all extremities. Sensation intact. Gait not checked.  PSYCHIATRIC: The patient is alert and oriented x 3. Global weakness SKIN: No obvious rash, lesion, or ulcer.    LABORATORY PANEL:   CBC Recent Labs  Lab 07/26/17 0839  WBC 8.7  HGB 11.6*  HCT 38.0  PLT 265   ------------------------------------------------------------------------------------------------------------------  Chemistries  Recent Labs  Lab 07/24/17 1108  07/26/17 0727 07/27/17 0534  NA 140   < > 139 139  K 3.8   < > 4.5 3.8  CL 110   < > 101 102  CO2 22   < > 28 26  GLUCOSE 107*   < > 90 92  BUN 23*   < > 41* 53*  CREATININE 0.72   < > 0.85 0.74  CALCIUM 9.3   < > 9.3 9.3  MG  --    < > 1.9  --   AST 34  --   --   --   ALT 26  --   --   --   ALKPHOS 76  --   --   --   BILITOT 0.8  --   --   --    < > = values in this interval not displayed.   ------------------------------------------------------------------------------------------------------------------  Cardiac Enzymes Recent Labs  Lab 07/25/17 0443  TROPONINI <0.03   ------------------------------------------------------------------------------------------------------------------  RADIOLOGY:  No results found.  EKG:   Orders placed or performed during the hospital encounter of 07/24/17  . ED EKG  . ED EKG    ASSESSMENT AND PLAN:    72 year old female with known history of CAD, history of stroke on warfarin, GERD,  hypertension, emphysema  Not on home oxygen  Since hospital secondary to shortness of breath and palpitations.  1. Acute  Systolic CHF-new diagnosis of CHF, EF of 35-45% with diastolic dysfunction and mitral regurgitation -Appreciate cardiology consult. -Lisinopril washout done and started on entresto, however blood pressure is very low- discontinue entresto at discharge and can be started as  outpatient -Received IV diuresis, back to baseline weight. Discharge on low-dose Lasix -Also started on Coreg -follow-up with cardiology as outpatient   2. History of strokes-  Stable now. No focal deficits. -Continue Coumadin   3. Anxiety- buspar  4. GERD - PPI  5. DVT Prophylaxis- on warfarin  PT recommended rehabilitation   All the records are reviewed and case discussed with Care Management/Social Workerr. Management plans discussed with the patient, family and they are in agreement.  CODE STATUS: Full Code  TOTAL TIME TAKING CARE OF THIS PATIENT: 39 minutes.   POSSIBLE D/C TODAY OR TOMORROW, DEPENDING ON CLINICAL CONDITION.   Michele Matthews M.D on 07/27/2017 at 10:00 AM  Between 7am to 6pm - Pager - [phone]  After 6pm go to [email or web address] - password [email or web address] [place] [facility]  Office  [phone]  CC: Primary care physician; No primary care provider on file.

## 2017-07-27 NOTE — Plan of Care (Signed)
  Problem: Clinical Measurements: Goal: Ability to maintain clinical measurements within normal limits will improve Outcome: Progressing Goal: Diagnostic test results will improve Outcome: Progressing Goal: Respiratory complications will improve Outcome: Progressing Goal: Cardiovascular complication will be avoided Outcome: Progressing   Problem: Activity: Goal: Risk for activity intolerance will decrease Outcome: Progressing   Problem: Activity: Goal: Capacity to carry out activities will improve Outcome: Progressing

## 2017-07-28 LAB — BASIC METABOLIC PANEL
Anion gap: 9 (ref 5–15)
BUN: 46 mg/dL — AB (ref 6–20)
CO2: 26 mmol/L (ref 22–32)
Calcium: 9.3 mg/dL (ref 8.9–10.3)
Chloride: 100 mmol/L — ABNORMAL LOW (ref 101–111)
Creatinine, Ser: 0.73 mg/dL (ref 0.44–1.00)
GFR calc Af Amer: >60 mL/min (ref >60)
GFR calc non Af Amer: >60 mL/min (ref >60)
GLUCOSE: 80 mg/dL (ref 65–99)
Potassium: 4.1 mmol/L (ref 3.5–5.1)
Sodium: 135 mmol/L (ref 135–145)

## 2017-07-28 LAB — PROTIME-INR
INR: 1.98
Prothrombin Time: 22.3 seconds — ABNORMAL HIGH (ref 11.4–15.2)

## 2017-07-28 NOTE — Progress Notes (Signed)
SUBJECTIVE: patient is feeling much better denies any chest pain or shortness of breath   Vitals:   07/27/17 1630 07/27/17 2004 07/28/17 0411 07/28/17 0754  BP: 101/69 114/66 119/63 122/74  Pulse: 91 92 91 96  Resp: 14 18 19 20   Temp:  98.2 F (36.8 C) 98.4 F (36.9 C) 97.7 F (36.5 C)  TempSrc:  Oral  Oral  SpO2: 99% 99% 98% 98%  Weight:   97 lb 3.2 oz (44.1 kg)   Height:        Intake/Output Summary (Last 24 hours) at 07/28/2017 0827 Last data filed at 07/27/2017 2334 Gross per 24 hour  Intake 480 ml  Output 1 ml  Net 479 ml    LABS: Basic Metabolic Panel: Recent Labs    07/26/17 0727 07/27/17 0534 07/28/17 0507  NA 139 139 135  K 4.5 3.8 4.1  CL 101 102 100*  CO2 28 26 26   GLUCOSE 90 92 80  BUN 41* 53* 46*  CREATININE 0.85 0.74 0.73  CALCIUM 9.3 9.3 9.3  MG 1.9  --   --    Liver Function Tests: No results for input(s): AST, ALT, ALKPHOS, BILITOT, PROT, ALBUMIN in the last 72 hours. No results for input(s): LIPASE, AMYLASE in the last 72 hours. CBC: Recent Labs    07/26/17 0839  WBC 8.7  HGB 11.6*  HCT 38.0  MCV 77.4*  PLT 265   Cardiac Enzymes: No results for input(s): CKTOTAL, CKMB, CKMBINDEX, TROPONINI in the last 72 hours. BNP: Invalid input(s): POCBNP D-Dimer: No results for input(s): DDIMER in the last 72 hours. Hemoglobin A1C: No results for input(s): HGBA1C in the last 72 hours. Fasting Lipid Panel: No results for input(s): CHOL, HDL, LDLCALC, TRIG, CHOLHDL, LDLDIRECT in the last 72 hours. Thyroid Function Tests: No results for input(s): TSH, T4TOTAL, T3FREE, THYROIDAB in the last 72 hours.  Invalid input(s): FREET3 Anemia Panel: No results for input(s): VITAMINB12, FOLATE, FERRITIN, TIBC, IRON, RETICCTPCT in the last 72 hours.   PHYSICAL EXAM General: Well developed, well nourished, in no acute distress HEENT:  Normocephalic and atramatic Neck:  No JVD.  Lungs: Clear bilaterally to auscultation and percussion. Heart: HRRR .  Normal S1 and S2 without gallops or murmurs.  Abdomen: Bowel sounds are positive, abdomen soft and non-tender  Msk:  Back normal, normal gait. Normal strength and tone for age. Extremities: No clubbing, cyanosis or edema.   Neuro: Alert and oriented X 3. Psych:  Good affect, responds appropriately  TELEMETRY: sinus rhythm  ASSESSMENT AND PLAN: severe left ventricular systolic dysfunction and congestive heart failure. Patient is feeling much better advise restarting entersto.  Active Problems:   CHF (congestive heart failure) ([facility])    Filemon Breton A, MD, [facility] 07/28/2017 8:27 AM

## 2017-07-28 NOTE — Progress Notes (Signed)
Rounded on patient. Patient lying in bed with HOB elevated.  Husband at bedside.  Patient awaiting insurance approval to be discharged to [facility].  Once again this RN reviewed the 5 Steps to Living Better with Heart Failure.  Reviewed patient's weights with her.  Patient has been weighed using the bed scales at times and stand up scales at other times.  This RN explained to patient and husband this could account for the fluctuations in her weight, which also makes it hard to evaluate true weight gain or loss.  Discussed with  patient Dr. Tressia Miners has written the following in the discharge instructions:  1. NAS Diet 2. Daily Weights 2. Contact physician / cardiologist for increase in weight of 2-3 pounds in one day or 5 pounds in one week.   This RN also informed patient and her husband that they could request for patient to be  weighed daily or remind the staff that she has heart failure and needs to be weighed daily. Both verbalized understanding.    Reminded patient and husband of patient's [facility] appointment on August 03, 2017 at 9:00 a.m.  Explained to patient and husband the appointment will be listed on discharge papers, which will be given to the facility.    Patient and husband thanked me for the above information.  Roanna Epley, RN, BSN, [facility] Cardiovascular and Pulmonary Nurse Navigator

## 2017-07-28 NOTE — Care Management Important Message (Signed)
Important Message  Patient Details  Name: Michele Matthews MRN: [id number] Date of Birth: 1945-12-29   Medicare Important Message Given:  Yes Signed IM notice given    Katrina Stack, RN 07/28/2017, 11:45 AM

## 2017-07-28 NOTE — Discharge Instructions (Signed)
Furosemide tablets °What is this medicine? °FUROSEMIDE (fyoor [place] se mide) is a diuretic. It helps you make more urine and to lose salt and excess water from your body. This medicine is used to treat high blood pressure, and edema or swelling from heart, kidney, or liver disease. °This medicine may be used for other purposes; ask your health care provider or pharmacist if you have questions. °COMMON BRAND NAME(S): Active-Medicated Specimen Kit, Delone, Diuscreen, Lasix, RX Specimen Collection Kit, Specimen Collection Kit, URINX Medicated Specimen Collection °What should I tell my health care provider before I take this medicine? °They need to know if you have any of these conditions: °-abnormal blood electrolytes °-diarrhea or vomiting °-gout °-heart disease °-kidney disease, small amounts of urine, or difficulty passing urine °-liver disease °-thyroid disease °-an unusual or allergic reaction to furosemide, sulfa drugs, other medicines, foods, dyes, or preservatives °-pregnant or trying to get pregnant °-breast-feeding °How should I use this medicine? °Take this medicine by mouth with a glass of water. Follow the directions on the prescription label. You may take this medicine with or without food. If it upsets your stomach, take it with food or milk. Do not take your medicine more often than directed. Remember that you will need to pass more urine after taking this medicine. Do not take your medicine at a time of day that will cause you problems. Do not take at bedtime. °Talk to your pediatrician regarding the use of this medicine in children. While this drug may be prescribed for selected conditions, precautions do apply. °Overdosage: If you think you have taken too much of this medicine contact a poison control center or emergency room at once. °NOTE: This medicine is only for you. Do not share this medicine with others. °What if I miss a dose? °If you miss a dose, take it as soon as you can. If it is almost time  for your next dose, take only that dose. Do not take double or extra doses. °What may interact with this medicine? °-aspirin and aspirin-like medicines °-certain antibiotics °-chloral hydrate °-cisplatin °-cyclosporine °-digoxin °-diuretics °-laxatives °-lithium °-medicines for blood pressure °-medicines that relax muscles for surgery °-methotrexate °-NSAIDs, medicines for pain and inflammation like ibuprofen, naproxen, or indomethacin °-phenytoin °-steroid medicines like prednisone or cortisone °-sucralfate °-thyroid hormones °This list may not describe all possible interactions. Give your health care provider a list of all the medicines, herbs, non-prescription drugs, or dietary supplements you use. Also tell them if you smoke, drink alcohol, or use illegal drugs. Some items may interact with your medicine. °What should I watch for while using this medicine? °Visit your doctor or health care professional for regular checks on your progress. Check your blood pressure regularly. Ask your doctor or health care professional what your blood pressure should be, and when you should contact him or her. If you are a diabetic, check your blood sugar as directed. °You may need to be on a special diet while taking this medicine. Check with your doctor. Also, ask how many glasses of fluid you need to drink a day. You must not get dehydrated. °You may get drowsy or dizzy. Do not drive, use machinery, or do anything that needs mental alertness until you know how this drug affects you. Do not stand or sit up quickly, especially if you are an older patient. This reduces the risk of dizzy or fainting spells. Alcohol can make you more drowsy and dizzy. Avoid alcoholic drinks. °This medicine can make you more sensitive   to the sun. Keep out of the sun. If you cannot avoid being in the sun, wear protective clothing and use sunscreen. Do not use sun lamps or tanning beds/booths. What side effects may I notice from receiving this  medicine? Side effects that you should report to your doctor or health care professional as soon as possible: -blood in urine or stools -dry mouth -fever or chills -hearing loss or ringing in the ears -irregular heartbeat -muscle pain or weakness, cramps -skin rash -stomach upset, pain, or nausea -tingling or numbness in the hands or feet -unusually weak or tired -vomiting or diarrhea -yellowing of the eyes or skin Side effects that usually do not require medical attention (report to your doctor or health care professional if they continue or are bothersome): -headache -loss of appetite -unusual bleeding or bruising This list may not describe all possible side effects. Call your doctor for medical advice about side effects. You may report side effects to FDA at 1-800-FDA-1088. Where should I keep my medicine? Keep out of the reach of children. Store at room temperature between 15 and 30 degrees C (59 and 86 degrees F). Protect from light. Throw away any unused medicine after the expiration date. NOTE: This sheet is a summary. It may not cover all possible information. If you have questions about this medicine, talk to your doctor, pharmacist, or health care provider.  2018 Elsevier/Gold Standard (2014-07-09 13:49:50) Carvedilol tablets What is this medicine? CARVEDILOL (KAR ve dil ol) is a beta-blocker. Beta-blockers reduce the workload on the heart and help it to beat more regularly. This medicine is used to treat high blood pressure and heart failure. This medicine may be used for other purposes; ask your health care provider or pharmacist if you have questions. COMMON BRAND NAME(S): Coreg What should I tell my health care provider before I take this medicine? They need to know if you have any of these conditions: -circulation problems -diabetes -history of heart attack or heart disease -liver disease -lung or breathing disease, like asthma or emphysema -pheochromocytoma -slow  or irregular heartbeat -thyroid disease -an unusual or allergic reaction to carvedilol, other beta-blockers, medicines, foods, dyes, or preservatives -pregnant or trying to get pregnant -breast-feeding How should I use this medicine? Take this medicine by mouth with a glass of water. Follow the directions on the prescription label. It is best to take the tablets with food. Take your doses at regular intervals. Do not take your medicine more often than directed. Do not stop taking except on the advice of your doctor or health care professional. Talk to your pediatrician regarding the use of this medicine in children. Special care may be needed. Overdosage: If you think you have taken too much of this medicine contact a poison control center or emergency room at once. NOTE: This medicine is only for you. Do not share this medicine with others. What if I miss a dose? If you miss a dose, take it as soon as you can. If it is almost time for your next dose, take only that dose. Do not take double or extra doses. What may interact with this medicine? This medicine may interact with the following medications: -certain medicines for blood pressure, heart disease, irregular heart beat -certain medicines for depression, like fluoxetine or paroxetine -certain medicines for diabetes, like glipizide or glyburide -cimetidine -clonidine -cyclosporine -digoxin -MAOIs like Carbex, Eldepryl, Marplan, Nardil, and Parnate -reserpine -rifampin This list may not describe all possible interactions. Give your health care provider a  list of all the medicines, herbs, non-prescription drugs, or dietary supplements you use. Also tell them if you smoke, drink alcohol, or use illegal drugs. Some items may interact with your medicine. What should I watch for while using this medicine? Check your heart rate and blood pressure regularly while you are taking this medicine. Ask your doctor or health care professional what  your heart rate and blood pressure should be, and when you should contact him or her. Do not stop taking this medicine suddenly. This could lead to serious heart-related effects. Contact your doctor or health care professional if you have difficulty breathing while taking this drug. Check your weight daily. Ask your doctor or health care professional when you should notify him/her of any weight gain. You may get drowsy or dizzy. Do not drive, use machinery, or do anything that requires mental alertness until you know how this medicine affects you. To reduce the risk of dizzy or fainting spells, do not sit or stand up quickly. Alcohol can make you more drowsy, and increase flushing and rapid heartbeats. Avoid alcoholic drinks. If you have diabetes, check your blood sugar as directed. Tell your doctor if you have changes in your blood sugar while you are taking this medicine. If you are going to have surgery, tell your doctor or health care professional that you are taking this medicine. What side effects may I notice from receiving this medicine? Side effects that you should report to your doctor or health care professional as soon as possible: -allergic reactions like skin rash, itching or hives, swelling of the face, lips, or tongue -breathing problems -dark urine -irregular heartbeat -swollen legs or ankles -vomiting -yellowing of the eyes or skin Side effects that usually do not require medical attention (report to your doctor or health care professional if they continue or are bothersome): -change in sex drive or performance -diarrhea -dry eyes (especially if wearing contact lenses) -dry, itching skin -headache -nausea -unusually tired This list may not describe all possible side effects. Call your doctor for medical advice about side effects. You may report side effects to FDA at 1-800-FDA-1088. Where should I keep my medicine? Keep out of the reach of children. Store at room  temperature below 30 degrees C (86 degrees F). Protect from moisture. Keep container tightly closed. Throw away any unused medicine after the expiration date. NOTE: This sheet is a summary. It may not cover all possible information. If you have questions about this medicine, talk to your doctor, pharmacist, or health care provider.  2018 Elsevier/Gold Standard (2012-12-23 14:12:02) Heart Failure Heart failure means your heart has trouble pumping blood. This makes it hard for your body to work well. Heart failure is usually a long-term (chronic) condition. You must take good care of yourself and follow your doctor's treatment plan. Follow these instructions at home:  Take your heart medicine as told by your doctor. ? Do not stop taking medicine unless your doctor tells you to. ? Do not skip any dose of medicine. ? Refill your medicines before they run out. ? Take other medicines only as told by your doctor or pharmacist.  Stay active if told by your doctor. The elderly and people with severe heart failure should talk with a doctor about physical activity.  Eat heart-healthy foods. Choose foods that are without trans fat and are low in saturated fat, cholesterol, and salt (sodium). This includes fresh or frozen fruits and vegetables, fish, lean meats, fat-free or low-fat dairy foods, whole grains,  and high-fiber foods. Lentils and dried peas and beans (legumes) are also good choices.  Limit salt if told by your doctor.  Cook in a healthy way. Roast, grill, broil, bake, poach, steam, or stir-fry foods.  Limit fluids as told by your doctor.  Weigh yourself every morning. Do this after you pee (urinate) and before you eat breakfast. Write down your weight to give to your doctor.  Take your blood pressure and write it down if your doctor tells you to.  Ask your doctor how to check your pulse. Check your pulse as told.  Lose weight if told by your doctor.  Stop smoking or chewing tobacco. Do  not use gum or patches that help you quit without your doctor's approval.  Schedule and go to doctor visits as told.  Nonpregnant women should have no more than 1 drink a day. Men should have no more than 2 drinks a day. Talk to your doctor about drinking alcohol.  Stop illegal drug use.  Stay current with shots (immunizations).  Manage your health conditions as told by your doctor.  Learn to manage your stress.  Rest when you are tired.  If it is really hot outside: ? Avoid intense activities. ? Use air conditioning or fans, or get in a cooler place. ? Avoid caffeine and alcohol. ? Wear loose-fitting, lightweight, and light-colored clothing.  If it is really cold outside: ? Avoid intense activities. ? Layer your clothing. ? Wear mittens or gloves, a hat, and a scarf when going outside. ? Avoid alcohol.  Learn about heart failure and get support as needed.  Get help to maintain or improve your quality of life and your ability to care for yourself as needed. Contact a doctor if:  You gain weight quickly.  You are more short of breath than usual.  You cannot do your normal activities.  You tire easily.  You cough more than normal, especially with activity.  You have any or more puffiness (swelling) in areas such as your hands, feet, ankles, or belly (abdomen).  You cannot sleep because it is hard to breathe.  You feel like your heart is beating fast (palpitations).  You get dizzy or light-headed when you stand up. Get help right away if:  You have trouble breathing.  There is a change in mental status, such as becoming less alert or not being able to focus.  You have chest pain or discomfort.  You faint. This information is not intended to replace advice given to you by your health care provider. Make sure you discuss any questions you have with your health care provider. Document Released: 01/26/2008 Document Revised: 09/24/2015 Document Reviewed:  06/04/2012 Elsevier Interactive Patient Education  2017 [employer].

## 2017-07-28 NOTE — Clinical Social Work Note (Addendum)
CSW received insurance approval for patient to go to SNF, however [facility] is the facility that she would like and they are not able to accept any patient's till Monday due to quarantine.  Patient has agreed to go to [facility] instead, CSW contacted [facility] and they can accept patient today if she is medically ready for discharge and orders have been received.  CSW to continue to follow patient's progress throughout discharge planning.  CSW contacted insurance company about change in facilities, awaiting for call back from [facility].  12:00pm  Patient to be d/c'ed today to [facility] room [id number].  Patient and family agreeable to plans will transport via ems RN to call report.  Jones Broom. [place], MSW, [place]  07/28/2017 10:31 AM

## 2017-07-28 NOTE — Discharge Summary (Signed)
[facility] NAME: Michele Matthews    MR#:  [id number]  DATE OF BIRTH:  [birth year]  DATE OF ADMISSION:  07/24/2017   ADMITTING PHYSICIAN: Saundra Shelling, MD  DATE OF DISCHARGE: 07/28/17  PRIMARY CARE PHYSICIAN: No primary care provider on file.   ADMISSION DIAGNOSIS:   Acute congestive heart failure, unspecified heart failure type ([place]) [I50.9]  DISCHARGE DIAGNOSIS:   Active Problems:   CHF (congestive heart failure) ([place])   SECONDARY DIAGNOSIS:   Past Medical History:  Diagnosis Date  . Allergy   . COPD (chronic obstructive pulmonary disease) ([place])   . Coronary artery disease   . Depression   . Emphysema of lung ([place])   . GERD (gastroesophageal reflux disease)   . Hyperlipidemia   . Hypertension   . Seizures ([place])   . Stroke [facility])     HOSPITAL COURSE:   72 year old female with known history of CAD, history of stroke on warfarin, GERD, hypertension, emphysema  Not on home oxygen  Since hospital secondary to shortness of breath and palpitations.  1. Acute  Systolic CHF-new diagnosis of CHF, EF of 97-67% with diastolic dysfunction and mitral regurgitation -Appreciate cardiology consult. -Lisinopril wash out done and started on entresto, however blood pressure is very low- discontinued entresto at discharge and can be started as outpatient -Received IV diuresis, back to baseline weight. Discharge on low-dose oral Lasix -Also started on Coreg -follow-up with cardiology as outpatient   2. History of strokes-  Stable now. No focal deficits. -Continue Coumadin   3. Anxiety- buspar  4. GERD - PPI   Patient is extremely weak, PT recommended rehabilitation    DISCHARGE CONDITIONS:   Guarded  CONSULTS OBTAINED:   Treatment Team:  Dionisio David, MD  DRUG ALLERGIES:   No Known Allergies DISCHARGE MEDICATIONS:   Allergies as of 07/28/2017   No Known Allergies     Medication List    STOP taking  these medications   albuterol 108 (90 Base) MCG/ACT inhaler Commonly known as:  PROVENTIL HFA;VENTOLIN HFA   cephALEXin 500 MG capsule Commonly known as:  KEFLEX   Fluticasone-Salmeterol 250-50 MCG/DOSE Aepb Commonly known as:  ADVAIR   metoprolol tartrate 25 MG tablet Commonly known as:  LOPRESSOR   tiotropium 18 MCG inhalation capsule Commonly known as:  SPIRIVA   triamcinolone cream 0.5 % Commonly known as:  KENALOG     TAKE these medications   aspirin EC 81 MG tablet Take 81 mg by mouth daily.   atorvastatin 10 MG tablet Commonly known as:  LIPITOR TAKE 1 TABLET EVERY DAY   busPIRone 7.5 MG tablet Commonly known as:  BUSPAR Take 1 tablet (7.5 mg total) by mouth 2 (two) times daily.   carvedilol 3.125 MG tablet Commonly known as:  COREG Take 1 tablet (3.125 mg total) by mouth 2 (two) times daily with a meal.   diphenhydramine-acetaminophen 25-500 MG Tabs tablet Commonly known as:  TYLENOL PM Take 1 tablet by mouth at bedtime as needed.   feeding supplement (ENSURE ENLIVE) Liqd Take 237 mLs by mouth 3 (three) times daily between meals.   furosemide 20 MG tablet Commonly known as:  LASIX Take 1 tablet (20 mg total) by mouth daily.   hydrOXYzine 10 MG tablet Commonly known as:  ATARAX/VISTARIL Take 1 tablet (10 mg total) by mouth 2 (two) times daily as needed for anxiety (sleep).   multivitamin tablet Take 1 tablet by mouth daily.  pantoprazole 40 MG tablet Commonly known as:  PROTONIX TAKE 1 TABLET EVERY DAY   sertraline 100 MG tablet Commonly known as:  ZOLOFT Take 1 tablet (100 mg total) by mouth 2 (two) times daily.   VISINE OP Place 1 drop into both eyes 2 (two) times daily as needed (dry eyes).   warfarin 5 MG tablet Commonly known as:  COUMADIN TAKE 1 TABLET ON THURSDAYS & SUNDAYS AND TAKE 1/2 TABLET ON MONDAYS, TUESDAYS, WEDNESDAYS,  FRIDAYS & SATURDAYS What changed:    how much to take  how to take this  when to take  this  additional instructions        DISCHARGE INSTRUCTIONS:   1. Low sodium Diet 2. Daily Weights -. Contact physician / cardiologist for increase in weight of 2-3 pounds in one day or 5 pounds in one week. 3. PCP f/u in 1 week  DIET:   Cardiac diet  ACTIVITY:   Activity as tolerated  OXYGEN:   Home Oxygen: No.  Oxygen Delivery: room air  DISCHARGE LOCATION:   nursing home   If you experience worsening of your admission symptoms, develop shortness of breath, life threatening emergency, suicidal or homicidal thoughts you must seek medical attention immediately by calling 911 or calling your MD immediately  if symptoms less severe.  You Must read complete instructions/literature along with all the possible adverse reactions/side effects for all the Medicines you take and that have been prescribed to you. Take any new Medicines after you have completely understood and accpet all the possible adverse reactions/side effects.   Please note  You were cared for by a hospitalist during your hospital stay. If you have any questions about your discharge medications or the care you received while you were in the hospital after you are discharged, you can call the unit and asked to speak with the hospitalist on call if the hospitalist that took care of you is not available. Once you are discharged, your primary care physician will handle any further medical issues. Please note that NO REFILLS for any discharge medications will be authorized once you are discharged, as it is imperative that you return to your primary care physician (or establish a relationship with a primary care physician if you do not have one) for your aftercare needs so that they can reassess your need for medications and monitor your lab values.    On the day of Discharge:  VITAL SIGNS:   Blood pressure 122/74, pulse 96, temperature 97.7 F (36.5 C), temperature source Oral, resp. rate 20, height 5\' 6"  (1.676  m), weight 44.1 kg (97 lb 3.2 oz), SpO2 98 %.  PHYSICAL EXAMINATION:    GENERAL:  72 y.o.-year-old ill nourished elderly appearing patient lying in the bed with no acute distress.  EYES: Pupils equal, round, reactive to light and accommodation. No scleral icterus. Extraocular muscles intact.  HEENT: Head atraumatic, normocephalic. Oropharynx and nasopharynx clear.  NECK:  Supple, no jugular venous distention. No thyroid enlargement, no tenderness.  LUNGS: Normal breath sounds bilaterally, no wheezing, rales,rhonchi or crepitation. No use of accessory muscles of respiration. Decreased bibasilar breath sounds CARDIOVASCULAR: S1, S2 normal. No  rubs, or gallops. 3/6 systolic murmur present ABDOMEN: Soft, nontender, nondistended. Bowel sounds present. No organomegaly or mass.  EXTREMITIES: No pedal edema, cyanosis, or clubbing.  NEUROLOGIC: Cranial nerves II through XII are intact. Muscle strength 5/5 in all extremities. Sensation intact. Gait not checked.  PSYCHIATRIC: The patient is alert and oriented x 3. Global  weakness SKIN: No obvious rash, lesion, or ulcer.     DATA REVIEW:   CBC Recent Labs  Lab 07/26/17 0839  WBC 8.7  HGB 11.6*  HCT 38.0  PLT 265    Chemistries  Recent Labs  Lab 07/24/17 1108  07/26/17 0727  07/28/17 0507  NA 140   < > 139   < > 135  K 3.8   < > 4.5   < > 4.1  CL 110   < > 101   < > 100*  CO2 22   < > 28   < > 26  GLUCOSE 107*   < > 90   < > 80  BUN 23*   < > 41*   < > 46*  CREATININE 0.72   < > 0.85   < > 0.73  CALCIUM 9.3   < > 9.3   < > 9.3  MG  --    < > 1.9  --   --   AST 34  --   --   --   --   ALT 26  --   --   --   --   ALKPHOS 76  --   --   --   --   BILITOT 0.8  --   --   --   --    < > = values in this interval not displayed.     Microbiology Results  Results for orders placed or performed during the hospital encounter of 04/28/17  Urine culture     Status: Abnormal   Collection Time: 04/28/17  4:20 PM  Result Value Ref Range  Status   Specimen Description   Final    URINE, RANDOM Performed at [facility], [address] [place], [place] [address]    Special Requests   Final    NONE Performed at [facility], [address] [place], [place] [address]    Culture >=100,000 COLONIES/mL ESCHERICHIA COLI (A)  Final   Report Status 05/01/2017 FINAL  Final   Organism ID, Bacteria ESCHERICHIA COLI (A)  Final      Susceptibility   Escherichia coli - MIC*    AMPICILLIN 16 INTERMEDIATE Intermediate     CEFAZOLIN 8 SENSITIVE Sensitive     CEFTRIAXONE <=1 SENSITIVE Sensitive     CIPROFLOXACIN <=0.25 SENSITIVE Sensitive     GENTAMICIN <=1 SENSITIVE Sensitive     IMIPENEM <=0.25 SENSITIVE Sensitive     NITROFURANTOIN <=16 SENSITIVE Sensitive     TRIMETH/SULFA <=20 SENSITIVE Sensitive     AMPICILLIN/SULBACTAM 4 SENSITIVE Sensitive     PIP/TAZO <=4 SENSITIVE Sensitive     Extended ESBL NEGATIVE Sensitive     * >=100,000 COLONIES/mL ESCHERICHIA COLI    RADIOLOGY:  No results found.   Management plans discussed with the patient, family and they are in agreement.  CODE STATUS:     Code Status Orders  (From admission, onward)        Start     Ordered   07/24/17 1648  Full code  Continuous     07/24/17 1647    Code Status History    Date Active Date Inactive Code Status Order ID Comments User Context   12/27/2015 1829 12/31/2015 1621 Full Code [id number]  Reubin Milan, MD Inpatient   10/26/2015 1532 10/28/2015 1639 Full Code [id number]  Hillary Bow, MD ED   08/09/2015 0415 08/11/2015 1816 Full Code [id number]  Harrie Foreman, MD ED  12/12/2014 1434 12/17/2014 0057 DNR [id number]  Max Sane, MD Inpatient   12/10/2014 1538 12/12/2014 1434 Full Code [id number]  Vaughan Basta, MD Inpatient      TOTAL TIME TAKING CARE OF THIS PATIENT: 38 minutes.    Gladstone Lighter M.D on 07/28/2017 at 11:50 AM  Between 7am to 6pm - Pager - [phone]  After 6pm go to [email or web address] -  [employer] [facility]  Office  [phone]  CC: Primary care physician; No primary care provider on file.   Note: This dictation was prepared with Dragon dictation along with smaller phrase technology. Any transcriptional errors that result from this process are unintentional.

## 2017-07-28 NOTE — Progress Notes (Signed)
Report called to Clarene Critchley at [facility]. EMS notified for transport.

## 2017-07-28 NOTE — Progress Notes (Addendum)
ANTICOAGULATION CONSULT NOTE - Follow up [facility] for warfarin Indication: VTE treatment  No Known Allergies  Patient Measurements: Height: 5\' 6"  (167.6 cm) Weight: 97 lb 3.2 oz (44.1 kg) IBW/kg (Calculated) : 59.3  Vital Signs: Temp: 97.7 F (36.5 C) (03/29 0754) Temp Source: Oral (03/29 0754) BP: 122/74 (03/29 0754) Pulse Rate: 96 (03/29 0754)  Labs: Recent Labs    07/26/17 0727 07/26/17 0839 07/27/17 0534 07/28/17 0507  HGB  --  11.6*  --   --   HCT  --  38.0  --   --   PLT  --  265  --   --   LABPROT  --  22.5* 23.3* 22.3*  INR  --  2.00 2.09 1.98  CREATININE 0.85  --  0.74 0.73    Estimated Creatinine Clearance: 43.6 mL/min (by C-G formula based on SCr of 0.73 mg/dL).   Assessment: 25 yof cc SOB/diarrhea with PMH emphysema, CAD, HLD, GERD, HTN, CVA, COPD, allergy, depression, seizure, stroke. Patient takes VKA PTA for hx CVA and her INR is therapeutic on admission. Pharmacy consulted to dose VKA for VTE treatment. CTA with no PE.   DATE INR DOSE 3/26 2.19 2.5 mg 3/27 2 5  mg 3/28 2.09     2.5 mg 3/29     1.98  Goal of Therapy:  INR 2-3 Monitor platelets by anticoagulation protocol: Yes   Plan:  INR borderline low. Pt due to receive higher dose tonight. Continue PTA warfarin dosing - 5 mg po on Mondays, Wednesdays, and Fridays; 2.5 mg po on Tuesdays, Thursdays, Saturdays, and Sundays. Will monitor INR daily d/t drug-disease interaction with CHF and adjust as needed to maintain INR 2 to 3. Will order CBC in AM per protocol.    Rocky Morel, Pharm.D., BCPS Clinical Pharmacist 07/28/2017,11:10 AM

## 2017-08-03 ENCOUNTER — Telehealth: Payer: Self-pay | Admitting: Family

## 2017-08-03 ENCOUNTER — Ambulatory Visit: Payer: Medicare HMO | Admitting: Family

## 2017-08-03 NOTE — Telephone Encounter (Signed)
Patient did not show for her [facility] appointment on 08/03/17. Will attempt to reschedule.

## 2020-11-25 ENCOUNTER — Emergency Department: Payer: Medicare HMO

## 2020-11-25 ENCOUNTER — Other Ambulatory Visit: Payer: Self-pay

## 2020-11-25 ENCOUNTER — Inpatient Hospital Stay
Admission: EM | Admit: 2020-11-25 | Discharge: 2020-12-08 | DRG: 239 | Disposition: A | Discharge status: Skilled Nursing Facility | Payer: Medicare HMO | Attending: Family Medicine | Admitting: Family Medicine

## 2020-11-25 ENCOUNTER — Encounter
Admission: EM | Disposition: A | Discharge status: Skilled Nursing Facility | Payer: Self-pay | Source: Home / Self Care | Attending: Family Medicine

## 2020-11-25 ENCOUNTER — Other Ambulatory Visit (INDEPENDENT_AMBULATORY_CARE_PROVIDER_SITE_OTHER): Payer: Self-pay | Admitting: Vascular Surgery

## 2020-11-25 ENCOUNTER — Inpatient Hospital Stay: Payer: Medicare HMO

## 2020-11-25 DIAGNOSIS — I10 Essential (primary) hypertension: Secondary | ICD-10-CM | POA: Diagnosis not present

## 2020-11-25 DIAGNOSIS — Z09 Encounter for follow-up examination after completed treatment for conditions other than malignant neoplasm: Secondary | ICD-10-CM

## 2020-11-25 DIAGNOSIS — L03116 Cellulitis of left lower limb: Secondary | ICD-10-CM | POA: Diagnosis present

## 2020-11-25 DIAGNOSIS — I6930 Unspecified sequelae of cerebral infarction: Secondary | ICD-10-CM | POA: Diagnosis not present

## 2020-11-25 DIAGNOSIS — E871 Hypo-osmolality and hyponatremia: Secondary | ICD-10-CM | POA: Diagnosis present

## 2020-11-25 DIAGNOSIS — J449 Chronic obstructive pulmonary disease, unspecified: Secondary | ICD-10-CM | POA: Diagnosis present

## 2020-11-25 DIAGNOSIS — I7777 Dissection of artery of lower extremity: Secondary | ICD-10-CM | POA: Diagnosis not present

## 2020-11-25 DIAGNOSIS — K219 Gastro-esophageal reflux disease without esophagitis: Secondary | ICD-10-CM | POA: Diagnosis present

## 2020-11-25 DIAGNOSIS — L97524 Non-pressure chronic ulcer of other part of left foot with necrosis of bone: Secondary | ICD-10-CM | POA: Diagnosis present

## 2020-11-25 DIAGNOSIS — I110 Hypertensive heart disease with heart failure: Secondary | ICD-10-CM | POA: Diagnosis present

## 2020-11-25 DIAGNOSIS — I70245 Atherosclerosis of native arteries of left leg with ulceration of other part of foot: Secondary | ICD-10-CM | POA: Diagnosis not present

## 2020-11-25 DIAGNOSIS — Z79899 Other long term (current) drug therapy: Secondary | ICD-10-CM

## 2020-11-25 DIAGNOSIS — M86172 Other acute osteomyelitis, left ankle and foot: Secondary | ICD-10-CM | POA: Diagnosis present

## 2020-11-25 DIAGNOSIS — L089 Local infection of the skin and subcutaneous tissue, unspecified: Secondary | ICD-10-CM | POA: Diagnosis not present

## 2020-11-25 DIAGNOSIS — D509 Iron deficiency anemia, unspecified: Secondary | ICD-10-CM | POA: Diagnosis present

## 2020-11-25 DIAGNOSIS — Z7902 Long term (current) use of antithrombotics/antiplatelets: Secondary | ICD-10-CM

## 2020-11-25 DIAGNOSIS — I2510 Atherosclerotic heart disease of native coronary artery without angina pectoris: Secondary | ICD-10-CM | POA: Diagnosis present

## 2020-11-25 DIAGNOSIS — I70262 Atherosclerosis of native arteries of extremities with gangrene, left leg: Secondary | ICD-10-CM | POA: Diagnosis present

## 2020-11-25 DIAGNOSIS — I96 Gangrene, not elsewhere classified: Secondary | ICD-10-CM | POA: Diagnosis not present

## 2020-11-25 DIAGNOSIS — E785 Hyperlipidemia, unspecified: Secondary | ICD-10-CM | POA: Diagnosis present

## 2020-11-25 DIAGNOSIS — I5042 Chronic combined systolic (congestive) and diastolic (congestive) heart failure: Secondary | ICD-10-CM | POA: Diagnosis present

## 2020-11-25 DIAGNOSIS — Z20822 Contact with and (suspected) exposure to covid-19: Secondary | ICD-10-CM | POA: Diagnosis present

## 2020-11-25 DIAGNOSIS — F40240 Claustrophobia: Secondary | ICD-10-CM | POA: Diagnosis present

## 2020-11-25 DIAGNOSIS — F419 Anxiety disorder, unspecified: Secondary | ICD-10-CM | POA: Diagnosis not present

## 2020-11-25 DIAGNOSIS — Z801 Family history of malignant neoplasm of trachea, bronchus and lung: Secondary | ICD-10-CM

## 2020-11-25 DIAGNOSIS — Z720 Tobacco use: Secondary | ICD-10-CM | POA: Diagnosis not present

## 2020-11-25 DIAGNOSIS — E43 Unspecified severe protein-calorie malnutrition: Secondary | ICD-10-CM | POA: Diagnosis present

## 2020-11-25 DIAGNOSIS — F17210 Nicotine dependence, cigarettes, uncomplicated: Secondary | ICD-10-CM | POA: Diagnosis present

## 2020-11-25 DIAGNOSIS — G629 Polyneuropathy, unspecified: Secondary | ICD-10-CM | POA: Diagnosis present

## 2020-11-25 DIAGNOSIS — Z6821 Body mass index (BMI) 21.0-21.9, adult: Secondary | ICD-10-CM | POA: Diagnosis not present

## 2020-11-25 DIAGNOSIS — D500 Iron deficiency anemia secondary to blood loss (chronic): Secondary | ICD-10-CM | POA: Diagnosis not present

## 2020-11-25 DIAGNOSIS — E876 Hypokalemia: Secondary | ICD-10-CM | POA: Diagnosis present

## 2020-11-25 DIAGNOSIS — B965 Pseudomonas (aeruginosa) (mallei) (pseudomallei) as the cause of diseases classified elsewhere: Secondary | ICD-10-CM | POA: Diagnosis present

## 2020-11-25 DIAGNOSIS — Z803 Family history of malignant neoplasm of breast: Secondary | ICD-10-CM

## 2020-11-25 DIAGNOSIS — Z23 Encounter for immunization: Secondary | ICD-10-CM

## 2020-11-25 DIAGNOSIS — Z8673 Personal history of transient ischemic attack (TIA), and cerebral infarction without residual deficits: Secondary | ICD-10-CM

## 2020-11-25 DIAGNOSIS — F32A Depression, unspecified: Secondary | ICD-10-CM | POA: Diagnosis present

## 2020-11-25 DIAGNOSIS — J432 Centrilobular emphysema: Secondary | ICD-10-CM

## 2020-11-25 DIAGNOSIS — Z7901 Long term (current) use of anticoagulants: Secondary | ICD-10-CM

## 2020-11-25 DIAGNOSIS — J690 Pneumonitis due to inhalation of food and vomit: Secondary | ICD-10-CM

## 2020-11-25 DIAGNOSIS — Z820 Family history of epilepsy and other diseases of the nervous system: Secondary | ICD-10-CM

## 2020-11-25 HISTORY — PX: LOWER EXTREMITY ANGIOGRAPHY: CATH118251

## 2020-11-25 LAB — COMPREHENSIVE METABOLIC PANEL
ALT: 15 U/L (ref 0–44)
AST: 23 U/L (ref 15–41)
Albumin: 3.6 g/dL (ref 3.5–5.0)
Alkaline Phosphatase: 74 U/L (ref 38–126)
Anion gap: 10 (ref 5–15)
BUN: 18 mg/dL (ref 8–23)
CO2: 25 mmol/L (ref 22–32)
Calcium: 9.7 mg/dL (ref 8.9–10.3)
Chloride: 104 mmol/L (ref 98–111)
Creatinine, Ser: 0.68 mg/dL (ref 0.44–1.00)
GFR, Estimated: >60 mL/min (ref >60)
Glucose, Bld: 101 mg/dL — ABNORMAL HIGH (ref 70–99)
Potassium: 3.9 mmol/L (ref 3.5–5.1)
Sodium: 139 mmol/L (ref 135–145)
Total Bilirubin: 0.6 mg/dL (ref 0.3–1.2)
Total Protein: 7.1 g/dL (ref 6.5–8.1)

## 2020-11-25 LAB — VITAMIN B12: Vitamin B-12: 449 pg/mL (ref 180–914)

## 2020-11-25 LAB — RESP PANEL BY RT-PCR (FLU A&B, COVID) ARPGX2
Influenza A by PCR: NEGATIVE
Influenza B by PCR: NEGATIVE
SARS Coronavirus 2 by RT PCR: NEGATIVE

## 2020-11-25 LAB — CBC WITH DIFFERENTIAL/PLATELET
Abs Immature Granulocytes: 0.02 10*3/uL (ref 0.00–0.07)
Basophils Absolute: 0 10*3/uL (ref 0.0–0.1)
Basophils Relative: 0 %
Eosinophils Absolute: 0.2 10*3/uL (ref 0.0–0.5)
Eosinophils Relative: 3 %
HCT: 28.6 % — ABNORMAL LOW (ref 36.0–46.0)
Hemoglobin: 8.3 g/dL — ABNORMAL LOW (ref 12.0–15.0)
Immature Granulocytes: 0 %
Lymphocytes Relative: 11 %
Lymphs Abs: 0.9 10*3/uL (ref 0.7–4.0)
MCH: 19.8 pg — ABNORMAL LOW (ref 26.0–34.0)
MCHC: 29 g/dL — ABNORMAL LOW (ref 30.0–36.0)
MCV: 68.1 fL — ABNORMAL LOW (ref 80.0–100.0)
Monocytes Absolute: 0.6 10*3/uL (ref 0.1–1.0)
Monocytes Relative: 7 %
Neutro Abs: 6.8 10*3/uL (ref 1.7–7.7)
Neutrophils Relative %: 79 %
Platelets: 373 10*3/uL (ref 150–400)
RBC: 4.2 MIL/uL (ref 3.87–5.11)
RDW: 18.9 % — ABNORMAL HIGH (ref 11.5–15.5)
WBC: 8.6 10*3/uL (ref 4.0–10.5)
nRBC: 0 % (ref 0.0–0.2)

## 2020-11-25 LAB — RETICULOCYTES
Immature Retic Fract: 22.8 % — ABNORMAL HIGH (ref 2.3–15.9)
RBC.: 4.33 MIL/uL (ref 3.87–5.11)
Retic Count, Absolute: 59.8 10*3/uL (ref 19.0–186.0)
Retic Ct Pct: 1.4 % (ref 0.4–3.1)

## 2020-11-25 LAB — HEMOGLOBIN A1C
Hgb A1c MFr Bld: 5.3 % (ref 4.8–5.6)
Mean Plasma Glucose: 105 mg/dL

## 2020-11-25 LAB — IRON AND TIBC
Iron: 12 ug/dL — ABNORMAL LOW (ref 28–170)
Saturation Ratios: 3 % — ABNORMAL LOW (ref 10.4–31.8)
TIBC: 433 ug/dL (ref 250–450)
UIBC: 421 ug/dL

## 2020-11-25 LAB — BRAIN NATRIURETIC PEPTIDE: B Natriuretic Peptide: 286.6 pg/mL — ABNORMAL HIGH (ref 0.0–100.0)

## 2020-11-25 LAB — APTT: aPTT: 38 seconds — ABNORMAL HIGH (ref 24–36)

## 2020-11-25 LAB — PROTIME-INR
INR: 1 (ref 0.8–1.2)
Prothrombin Time: 13.6 seconds (ref 11.4–15.2)

## 2020-11-25 LAB — LACTIC ACID, PLASMA: Lactic Acid, Venous: 0.9 mmol/L (ref 0.5–1.9)

## 2020-11-25 LAB — FERRITIN: Ferritin: 5 ng/mL — ABNORMAL LOW (ref 11–307)

## 2020-11-25 LAB — SEDIMENTATION RATE: Sed Rate: 35 mm/hr — ABNORMAL HIGH (ref 0–30)

## 2020-11-25 LAB — C-REACTIVE PROTEIN: CRP: 2.4 mg/dL — ABNORMAL HIGH (ref <1.0)

## 2020-11-25 SURGERY — LOWER EXTREMITY ANGIOGRAPHY
Anesthesia: Moderate Sedation | Laterality: Left

## 2020-11-25 MED ORDER — MORPHINE SULFATE (PF) 2 MG/ML IV SOLN
1.0000 mg | INTRAVENOUS | Status: DC | PRN
  Administered 2020-11-25 – 2020-11-29 (×8): 1 mg via INTRAVENOUS
  Filled 2020-11-25 (×9): qty 1

## 2020-11-25 MED ORDER — FENTANYL CITRATE (PF) 100 MCG/2ML IJ SOLN
INTRAMUSCULAR | Status: AC
  Filled 2020-11-25: qty 2

## 2020-11-25 MED ORDER — METRONIDAZOLE 500 MG/100ML IV SOLN
500.0000 mg | Freq: Two times a day (BID) | INTRAVENOUS | Status: DC
  Administered 2020-11-25 – 2020-11-27 (×5): 500 mg via INTRAVENOUS
  Filled 2020-11-25 (×5): qty 100

## 2020-11-25 MED ORDER — DIPHENHYDRAMINE-APAP (SLEEP) 25-500 MG PO TABS: 1.0000 | ORAL_TABLET | Freq: Every evening | ORAL | Status: DC | PRN

## 2020-11-25 MED ORDER — ALBUTEROL SULFATE (2.5 MG/3ML) 0.083% IN NEBU: 3.0000 mL | INHALATION_SOLUTION | RESPIRATORY_TRACT | Status: DC | PRN

## 2020-11-25 MED ORDER — DIPHENHYDRAMINE HCL 50 MG/ML IJ SOLN
INTRAMUSCULAR | Status: AC
  Administered 2020-11-25: 25 mg via INTRAVENOUS
  Filled 2020-11-25: qty 1

## 2020-11-25 MED ORDER — METHYLPREDNISOLONE SODIUM SUCC 125 MG IJ SOLR: 125.0000 mg | Freq: Once | INTRAMUSCULAR | Status: DC | PRN

## 2020-11-25 MED ORDER — VANCOMYCIN HCL IN DEXTROSE 1-5 GM/200ML-% IV SOLN
1000.0000 mg | Freq: Once | INTRAVENOUS | Status: AC
  Administered 2020-11-25: 1000 mg via INTRAVENOUS
  Filled 2020-11-25: qty 200

## 2020-11-25 MED ORDER — HYDROMORPHONE HCL 1 MG/ML IJ SOLN
INTRAMUSCULAR | Status: AC
  Filled 2020-11-25: qty 1

## 2020-11-25 MED ORDER — MIDAZOLAM HCL 2 MG/ML PO SYRP: 8.0000 mg | ORAL_SOLUTION | Freq: Once | ORAL | Status: DC | PRN

## 2020-11-25 MED ORDER — CARVEDILOL 6.25 MG PO TABS
3.1250 mg | ORAL_TABLET | Freq: Two times a day (BID) | ORAL | Status: DC
  Administered 2020-11-26 – 2020-12-08 (×25): 3.125 mg via ORAL
  Filled 2020-11-25 (×25): qty 1

## 2020-11-25 MED ORDER — ENSURE ENLIVE PO LIQD
237.0000 mL | Freq: Three times a day (TID) | ORAL | Status: DC
  Administered 2020-11-25 – 2020-12-08 (×36): 237 mL via ORAL

## 2020-11-25 MED ORDER — FENTANYL CITRATE (PF) 100 MCG/2ML IJ SOLN
INTRAMUSCULAR | Status: DC | PRN
  Administered 2020-11-25 (×2): 12.5 ug via INTRAVENOUS
  Administered 2020-11-25: 25 ug via INTRAVENOUS
  Administered 2020-11-25: 12.5 ug via INTRAVENOUS

## 2020-11-25 MED ORDER — METRONIDAZOLE 500 MG/100ML IV SOLN
500.0000 mg | Freq: Once | INTRAVENOUS | Status: AC
  Administered 2020-11-25: 500 mg via INTRAVENOUS
  Filled 2020-11-25: qty 100

## 2020-11-25 MED ORDER — HYDROXYZINE HCL 10 MG PO TABS
10.0000 mg | ORAL_TABLET | Freq: Two times a day (BID) | ORAL | Status: DC | PRN
  Administered 2020-11-25 – 2020-12-07 (×6): 10 mg via ORAL
  Filled 2020-11-25 (×9): qty 1

## 2020-11-25 MED ORDER — MIDAZOLAM HCL 2 MG/2ML IJ SOLN
INTRAMUSCULAR | Status: AC
  Filled 2020-11-25: qty 2

## 2020-11-25 MED ORDER — VANCOMYCIN HCL IN DEXTROSE 1-5 GM/200ML-% IV SOLN
1000.0000 mg | INTRAVENOUS | Status: DC
  Administered 2020-11-26 – 2020-11-29 (×4): 1000 mg via INTRAVENOUS
  Filled 2020-11-25 (×6): qty 200

## 2020-11-25 MED ORDER — PANTOPRAZOLE SODIUM 40 MG PO TBEC
40.0000 mg | DELAYED_RELEASE_TABLET | Freq: Every day | ORAL | Status: DC
  Administered 2020-11-26 – 2020-12-08 (×13): 40 mg via ORAL
  Filled 2020-11-25 (×13): qty 1

## 2020-11-25 MED ORDER — DIPHENHYDRAMINE HCL 25 MG PO CAPS
25.0000 mg | ORAL_CAPSULE | Freq: Every evening | ORAL | Status: DC | PRN
  Administered 2020-12-08: 25 mg via ORAL
  Filled 2020-11-25: qty 1

## 2020-11-25 MED ORDER — ADULT MULTIVITAMIN W/MINERALS CH
1.0000 | ORAL_TABLET | Freq: Every day | ORAL | Status: DC
  Administered 2020-11-26 – 2020-12-08 (×13): 1 via ORAL
  Filled 2020-11-25 (×13): qty 1

## 2020-11-25 MED ORDER — HYDRALAZINE HCL 20 MG/ML IJ SOLN: 5.0000 mg | INTRAMUSCULAR | Status: DC | PRN

## 2020-11-25 MED ORDER — MIDAZOLAM HCL 2 MG/2ML IJ SOLN
INTRAMUSCULAR | Status: DC | PRN
  Administered 2020-11-25: 1 mg via INTRAVENOUS
  Administered 2020-11-25: 0.5 mg via INTRAVENOUS

## 2020-11-25 MED ORDER — NICOTINE 21 MG/24HR TD PT24
21.0000 mg | MEDICATED_PATCH | Freq: Every day | TRANSDERMAL | Status: DC
  Administered 2020-11-25 – 2020-12-08 (×14): 21 mg via TRANSDERMAL
  Filled 2020-11-25 (×15): qty 1

## 2020-11-25 MED ORDER — SERTRALINE HCL 50 MG PO TABS
100.0000 mg | ORAL_TABLET | Freq: Two times a day (BID) | ORAL | Status: DC
  Administered 2020-11-25 – 2020-12-08 (×26): 100 mg via ORAL
  Filled 2020-11-25 (×26): qty 2

## 2020-11-25 MED ORDER — SODIUM CHLORIDE 0.9 % IV SOLN: INTRAVENOUS | Status: DC

## 2020-11-25 MED ORDER — OXYCODONE-ACETAMINOPHEN 5-325 MG PO TABS
1.0000 | ORAL_TABLET | ORAL | Status: DC | PRN
  Administered 2020-11-25 – 2020-12-03 (×14): 1 via ORAL
  Filled 2020-11-25 (×14): qty 1

## 2020-11-25 MED ORDER — FUROSEMIDE 20 MG PO TABS
20.0000 mg | ORAL_TABLET | Freq: Every day | ORAL | Status: DC
  Administered 2020-11-26 – 2020-12-08 (×13): 20 mg via ORAL
  Filled 2020-11-25 (×13): qty 1

## 2020-11-25 MED ORDER — BUSPIRONE HCL 15 MG PO TABS
7.5000 mg | ORAL_TABLET | Freq: Two times a day (BID) | ORAL | Status: DC
  Administered 2020-11-25 – 2020-12-08 (×26): 7.5 mg via ORAL
  Filled 2020-11-25 (×28): qty 1

## 2020-11-25 MED ORDER — LISINOPRIL 10 MG PO TABS
5.0000 mg | ORAL_TABLET | Freq: Every day | ORAL | Status: DC
  Administered 2020-11-26 – 2020-12-08 (×13): 5 mg via ORAL
  Filled 2020-11-25 (×13): qty 1

## 2020-11-25 MED ORDER — IODIXANOL 320 MG/ML IV SOLN
INTRAVENOUS | Status: DC | PRN
  Administered 2020-11-25: 55 mL

## 2020-11-25 MED ORDER — ACETAMINOPHEN 325 MG PO TABS
650.0000 mg | ORAL_TABLET | Freq: Four times a day (QID) | ORAL | Status: DC | PRN
  Administered 2020-11-27 – 2020-12-03 (×2): 650 mg via ORAL
  Filled 2020-11-25 (×2): qty 2

## 2020-11-25 MED ORDER — HYDROMORPHONE HCL 1 MG/ML IJ SOLN
1.0000 mg | Freq: Once | INTRAMUSCULAR | Status: AC | PRN
Start: 2020-11-25 — End: 2020-11-25
  Administered 2020-11-25: 1 mg via INTRAVENOUS

## 2020-11-25 MED ORDER — CEFAZOLIN SODIUM-DEXTROSE 2-4 GM/100ML-% IV SOLN
2.0000 g | Freq: Once | INTRAVENOUS | Status: DC
  Administered 2020-11-25: 2 g via INTRAVENOUS

## 2020-11-25 MED ORDER — CLOPIDOGREL BISULFATE 75 MG PO TABS
75.0000 mg | ORAL_TABLET | Freq: Every day | ORAL | Status: DC
  Administered 2020-11-26 – 2020-12-08 (×13): 75 mg via ORAL
  Filled 2020-11-25 (×13): qty 1

## 2020-11-25 MED ORDER — ONDANSETRON HCL 4 MG/2ML IJ SOLN
4.0000 mg | Freq: Four times a day (QID) | INTRAMUSCULAR | Status: DC | PRN
  Administered 2020-12-01: 4 mg via INTRAVENOUS
  Filled 2020-11-25: qty 2

## 2020-11-25 MED ORDER — SODIUM CHLORIDE 0.9 % IV SOLN
1.0000 g | INTRAVENOUS | Status: DC
  Administered 2020-11-26 – 2020-11-28 (×3): 1 g via INTRAVENOUS
  Filled 2020-11-25: qty 1
  Filled 2020-11-25: qty 10
  Filled 2020-11-25 (×2): qty 1
  Filled 2020-11-25: qty 10

## 2020-11-25 MED ORDER — DM-GUAIFENESIN ER 30-600 MG PO TB12: 1.0000 | ORAL_TABLET | Freq: Two times a day (BID) | ORAL | Status: DC | PRN

## 2020-11-25 MED ORDER — SODIUM CHLORIDE 0.9 % IV SOLN
1.0000 g | Freq: Once | INTRAVENOUS | Status: AC
  Administered 2020-11-25: 1 g via INTRAVENOUS
  Filled 2020-11-25: qty 10

## 2020-11-25 MED ORDER — HEPARIN SODIUM (PORCINE) 1000 UNIT/ML IJ SOLN
INTRAMUSCULAR | Status: DC | PRN
  Administered 2020-11-25: 5000 [IU] via INTRAVENOUS

## 2020-11-25 MED ORDER — HEPARIN SODIUM (PORCINE) 1000 UNIT/ML IJ SOLN
INTRAMUSCULAR | Status: AC
  Filled 2020-11-25: qty 1

## 2020-11-25 MED ORDER — FAMOTIDINE 20 MG PO TABS: 40.0000 mg | ORAL_TABLET | Freq: Once | ORAL | Status: DC | PRN

## 2020-11-25 MED ORDER — DIPHENHYDRAMINE HCL 50 MG/ML IJ SOLN: 50.0000 mg | Freq: Once | INTRAMUSCULAR | Status: AC | PRN

## 2020-11-25 MED ORDER — TETRAHYDROZOLINE HCL 0.05 % OP SOLN
1.0000 [drp] | Freq: Two times a day (BID) | OPHTHALMIC | Status: DC | PRN
  Filled 2020-11-25: qty 15

## 2020-11-25 MED ORDER — SODIUM CHLORIDE 0.9 % IV SOLN
INTRAVENOUS | Status: DC | PRN
  Administered 2020-11-25 – 2020-11-26 (×2): 250 mL via INTRAVENOUS

## 2020-11-25 MED ORDER — ONDANSETRON HCL 4 MG/2ML IJ SOLN: 4.0000 mg | Freq: Three times a day (TID) | INTRAMUSCULAR | Status: DC | PRN

## 2020-11-25 MED ORDER — ATORVASTATIN CALCIUM 10 MG PO TABS
10.0000 mg | ORAL_TABLET | Freq: Every day | ORAL | Status: DC
  Administered 2020-11-25 – 2020-12-08 (×14): 10 mg via ORAL
  Filled 2020-11-25 (×14): qty 1

## 2020-11-25 SURGICAL SUPPLY — 23 items
BALLN LUTONIX 018 4X220X130 (BALLOONS) ×2
BALLN LUTONIX 018 4X300X130 (BALLOONS) ×2
BALLN ULTRVRSE 2.5X300X150 (BALLOONS) ×2
BALLOON LUTONIX 018 4X220X130 (BALLOONS) ×1 IMPLANT
BALLOON LUTONIX 018 4X300X130 (BALLOONS) ×1 IMPLANT
BALLOON ULTRVRSE 2.5X300X150 (BALLOONS) ×1 IMPLANT
CATH ANGIO 5F PIGTAIL 65CM (CATHETERS) ×2 IMPLANT
CATH BEACON 5 .035 65 RIM TIP (CATHETERS) ×2 IMPLANT
CATH NAVICROSS ANGLED 135CM (MICROCATHETER) ×2 IMPLANT
CATH VERT 5X100 (CATHETERS) ×2 IMPLANT
COVER PROBE U/S 5X48 (MISCELLANEOUS) ×2 IMPLANT
DEVICE STARCLOSE SE CLOSURE (Vascular Products) ×2 IMPLANT
GLIDEWIRE ADV .035X260CM (WIRE) ×2 IMPLANT
KIT ENCORE 26 ADVANTAGE (KITS) ×2 IMPLANT
PACK ANGIOGRAPHY (CUSTOM PROCEDURE TRAY) ×2 IMPLANT
SHEATH ANL2 6FRX45 HC (SHEATH) ×2 IMPLANT
SHEATH BRITE TIP 5FRX11 (SHEATH) ×2 IMPLANT
STENT VIABAHN 6X150X120 (Permanent Stent) ×2 IMPLANT
STENT VIABAHN 6X250X120 (Permanent Stent) ×2 IMPLANT
SYR MEDRAD MARK 7 150ML (SYRINGE) ×2 IMPLANT
TUBING CONTRAST HIGH PRESS 72 (TUBING) ×2 IMPLANT
WIRE G V18X300CM (WIRE) ×2 IMPLANT
WIRE GUIDERIGHT .035X150 (WIRE) ×2 IMPLANT

## 2020-11-25 NOTE — H&P (Signed)
History and Physical    Michele Matthews DOB: 04-22-1946 DOA: 11/25/2020  Referring MD/NP/PA:   PCP: [place]   Patient coming from:  The patient is coming from home.          Chief Complaint: left foot pain and left foot wound with infection  HPI: Michele Matthews is a 75 y.o. female with medical history significant of HTN, HLD, COPD, stroke on Coumadin, GERD, depression with anxiety, seizure, CAD, sCHF with EF 25-30&, UGIB, anemia, tobacco abuse, HOH, COPD, who present with left foot pain and left foot wound with infection.  Pt states that she has left foot pain and wounds on the dorsal side of left wound for more than 3 weeks, which has has been progressively worsening.  The pain is constant, sharp, 10 out of 10 severity, nonradiating.  She also has foul-smelling drainage from the wounds.  Patient does not have fever or chills.  No chest pain, cough, shortness of breath.  No nausea vomiting, diarrhea or abdominal pain.  No symptoms of UTI.  Per EDP, pt have very faint pulse in left foot, but DP/PT pulses are detectable by Doppler.  ED Course: pt was found to have WBC 8.6, lactic acid 0.9, negative COVID test, electrolytes renal function okay, BNP 286, temperature normal, blood pressure 171/76, heart rate 83, RR 22, oxygen saturation 100% on room air.  Chest x-ray negative for infiltration.  Patient is admitted to [facility] bed as inpatient.  Dr. Lucky Cowboy of [facility] and Dr. Luana Shu of podiatry are consulted.  X-ray of left foot: 1. Soft tissue air at the dorsum of the foot overlying the level of the MTP joints with a small amount of air is seen tracking proximally to the level of the mid metatarsal diaphysis. Findings suggestive of soft tissue infection with a gas-forming organism. 2. No radiographic evidence of acute osteomyelitis.     Review of Systems:   General: no fevers, chills, no body weight gain, has fatigue HEENT: no blurry vision, hearing changes or sore  throat Respiratory: no dyspnea, coughing, wheezing CV: no chest pain, no palpitations GI: no nausea, vomiting, abdominal pain, diarrhea, constipation GU: no dysuria, burning on urination, increased urinary frequency, hematuria  Ext: has trace leg edema Neuro: no unilateral weakness, numbness, or tingling, no vision change or hearing loss Skin: has wounds in left foot MSK: No muscle spasm, no deformity, no limitation of range of movement in spin Heme: No easy bruising.  Travel history: No recent long distant travel.  Allergy: No Known Allergies  Past Medical History:  Diagnosis Date   Allergy    COPD (chronic obstructive pulmonary disease) (HCC)    Coronary artery disease    Depression    Emphysema of lung (HCC)    GERD (gastroesophageal reflux disease)    Hyperlipidemia    Hypertension    Seizures ([place])    Stroke [facility])     Past Surgical History:  Procedure Laterality Date   ESOPHAGOGASTRODUODENOSCOPY N/A 08/10/2015   Procedure: ESOPHAGOGASTRODUODENOSCOPY (EGD);  Surgeon: Manya Silvas, MD;  Location: [facility] ENDOSCOPY;  Service: Endoscopy;  Laterality: N/A;   ESOPHAGOGASTRODUODENOSCOPY (EGD) WITH PROPOFOL N/A 12/11/2014   Procedure: ESOPHAGOGASTRODUODENOSCOPY (EGD) WITH PROPOFOL;  Surgeon: Lucilla Lame, MD;  Location: [facility] ENDOSCOPY;  Service: Endoscopy;  Laterality: N/A;   ESOPHAGOGASTRODUODENOSCOPY (EGD) WITH PROPOFOL N/A 12/29/2015   Procedure: ESOPHAGOGASTRODUODENOSCOPY (EGD) WITH PROPOFOL;  Surgeon: Mauri Pole, MD;  Location: [facility] ENDOSCOPY;  Service: Endoscopy;  Laterality: N/A;  Social History:  reports that she has been smoking. She has been smoking an average of 1 pack per day. She uses smokeless tobacco. She reports that she does not drink alcohol and does not use drugs.  Family History:  Family History  Problem Relation Age of Onset   Alzheimer's disease Father    Lung cancer Sister    Breast cancer Sister    Cancer Mother      Prior to Admission  medications   Medication Sig Start Date End Date Taking? Authorizing Provider  aspirin EC 81 MG tablet Take 81 mg by mouth daily.    [provider]  atorvastatin (LIPITOR) 10 MG tablet TAKE 1 TABLET EVERY DAY 03/31/17   Mikey College, NP  busPIRone (BUSPAR) 7.5 MG tablet Take 1 tablet (7.5 mg total) by mouth 2 (two) times daily. 11/03/16   Karamalegos, Devonne Doughty, DO  carvedilol (COREG) 3.125 MG tablet Take 1 tablet (3.125 mg total) by mouth 2 (two) times daily with a meal. 07/27/17   Gladstone Lighter, MD  diphenhydramine-acetaminophen (TYLENOL PM) 25-500 MG TABS tablet Take 1 tablet by mouth at bedtime as needed.    [provider]  feeding supplement, ENSURE ENLIVE, (ENSURE ENLIVE) LIQD Take 237 mLs by mouth 3 (three) times daily between meals. 07/27/17   Gladstone Lighter, MD  furosemide (LASIX) 20 MG tablet Take 1 tablet (20 mg total) by mouth daily. 07/27/17   Gladstone Lighter, MD  hydrOXYzine (ATARAX/VISTARIL) 10 MG tablet Take 1 tablet (10 mg total) by mouth 2 (two) times daily as needed for anxiety (sleep). 11/03/16   Olin Hauser, DO  Multiple Vitamin (MULTIVITAMIN) tablet Take 1 tablet by mouth daily.    [provider]  pantoprazole (PROTONIX) 40 MG tablet TAKE 1 TABLET EVERY DAY 03/31/17   Mikey College, NP  sertraline (ZOLOFT) 100 MG tablet Take 1 tablet (100 mg total) by mouth 2 (two) times daily. 07/15/16   Karamalegos, Devonne Doughty, DO  Tetrahydrozoline HCl (VISINE OP) Place 1 drop into both eyes 2 (two) times daily as needed (dry eyes).    [provider]  warfarin (COUMADIN) 5 MG tablet TAKE 1 TABLET ON THURSDAYS & SUNDAYS AND TAKE 1/2 TABLET ON MONDAYS, TUESDAYS, WEDNESDAYS,  [place] Patient taking differently: Take 2.5-5 mg by mouth one time only at 6 PM.  03/31/17   Mikey College, NP    Physical Exam: Vitals:   11/25/20 1022 11/25/20 1025  BP:  (!) 171/76  Pulse:  83  Resp:  (!) 22  Temp:   98 F (36.7 C)  TempSrc:  Oral  SpO2:  100%  Weight: 63.4 kg   Height: 5' 5"  (1.651 m)    General: Not in acute distress HEENT:       Eyes: PERRL, EOMI, no scleral icterus.       ENT: No discharge from the ears and nose, no pharynx injection, no tonsillar enlargement.        Neck: No JVD, no bruit, no mass felt. Heme: No neck lymph node enlargement. Cardiac: S1/S2, RRR, No murmurs, No gallops or rubs. Respiratory: No rales, wheezing, rhonchi or rubs. GI: Soft, nondistended, nontender, no rebound pain, no organomegaly, BS present. GU: No hematuria Ext:  has trace leg edema bilaterally Musculoskeletal: No joint deformities, No joint redness or warmth, no limitation of ROM in spin. Skin: has threes wounds in the dorsal side of left foot, with gangrenous change, central area of necrosis, foul smell drainage and  surrounding mild erythema.    Neuro: Alert, oriented X3, cranial nerves II-XII grossly intact, moves all extremities normally. Psych: Patient is not psychotic, no suicidal or hemocidal ideation.  Labs on Admission: I have personally reviewed following labs and imaging studies  CBC: Recent Labs  Lab 11/25/20 1023  WBC 8.6  NEUTROABS 6.8  HGB 8.3*  HCT 28.6*  MCV 68.1*  PLT 696   Basic Metabolic Panel: Recent Labs  Lab 11/25/20 1023  NA 139  K 3.9  CL 104  CO2 25  GLUCOSE 101*  BUN 18  CREATININE 0.68  CALCIUM 9.7   GFR: Estimated Creatinine Clearance: 53.8 mL/min (by C-G formula based on SCr of 0.68 mg/dL). Liver Function Tests: Recent Labs  Lab 11/25/20 1023  AST 23  ALT 15  ALKPHOS 74  BILITOT 0.6  PROT 7.1  ALBUMIN 3.6   No results for input(s): LIPASE, AMYLASE in the last 168 hours. No results for input(s): AMMONIA in the last 168 hours. Coagulation Profile: No results for input(s): INR, PROTIME in the last 168 hours. Cardiac Enzymes: No results for input(s): CKTOTAL, CKMB, CKMBINDEX, TROPONINI in the last 168 hours. BNP (last 3  results) No results for input(s): PROBNP in the last 8760 hours. HbA1C: No results for input(s): HGBA1C in the last 72 hours. CBG: No results for input(s): GLUCAP in the last 168 hours. Lipid Profile: No results for input(s): CHOL, HDL, LDLCALC, TRIG, CHOLHDL, LDLDIRECT in the last 72 hours. Thyroid Function Tests: No results for input(s): TSH, T4TOTAL, FREET4, T3FREE, THYROIDAB in the last 72 hours. Anemia Panel: Recent Labs    11/25/20 1024  RETICCTPCT 1.4   Urine analysis:    Component Value Date/Time   COLORURINE COLORLESS (A) 07/24/2017 1159   APPEARANCEUR CLEAR (A) 07/24/2017 1159   APPEARANCEUR Hazy 03/27/2014 1845   LABSPEC 1.006 07/24/2017 1159   LABSPEC 1.028 03/27/2014 1845   PHURINE 7.0 07/24/2017 1159   GLUCOSEU NEGATIVE 07/24/2017 1159   GLUCOSEU Negative 03/27/2014 1845   HGBUR NEGATIVE 07/24/2017 1159   BILIRUBINUR NEGATIVE 07/24/2017 1159   BILIRUBINUR Negative 03/27/2014 1845   KETONESUR NEGATIVE 07/24/2017 1159   PROTEINUR NEGATIVE 07/24/2017 1159   NITRITE NEGATIVE 07/24/2017 1159   LEUKOCYTESUR NEGATIVE 07/24/2017 1159   LEUKOCYTESUR 3+ 03/27/2014 1845   Sepsis Labs: @LABRCNTIP (procalcitonin:4,lacticidven:4) ) Recent Results (from the past 240 hour(s))  Resp Panel by RT-PCR (Flu A&B, Covid) Nasopharyngeal Swab     Status: None   Collection Time: 11/25/20 10:24 AM   Specimen: Nasopharyngeal Swab; Nasopharyngeal(NP) swabs in vial transport medium  Result Value Ref Range Status   SARS Coronavirus 2 by RT PCR NEGATIVE NEGATIVE Final    Comment: (NOTE) SARS-CoV-2 target nucleic acids are NOT DETECTED.  The SARS-CoV-2 RNA is generally detectable in upper respiratory specimens during the acute phase of infection. The lowest concentration of SARS-CoV-2 viral copies this assay can detect is 138 copies/mL. A negative result does not preclude SARS-Cov-2 infection and should not be used as the sole basis for treatment or other patient management  decisions. A negative result may occur with  improper specimen collection/handling, submission of specimen other than nasopharyngeal swab, presence of viral mutation(s) within the areas targeted by this assay, and inadequate number of viral copies(<138 copies/mL). A negative result must be combined with clinical observations, patient history, and epidemiological information. The expected result is Negative.  Fact Sheet for Patients:  EntrepreneurPulse.com.au  Fact Sheet for Healthcare Providers:  IncredibleEmployment.be  This test is no t yet approved or cleared  by the [place] and  has been authorized for detection and/or diagnosis of SARS-CoV-2 by FDA under an Emergency Use Authorization (EUA). This EUA will remain  in effect (meaning this test can be used) for the duration of the COVID-19 declaration under Section 564(b)(1) of the Act, 21 U.S.C.section 360bbb-3(b)(1), unless the authorization is terminated  or revoked sooner.       Influenza A by PCR NEGATIVE NEGATIVE Final   Influenza B by PCR NEGATIVE NEGATIVE Final    Comment: (NOTE) The Xpert Xpress SARS-CoV-2/FLU/RSV plus assay is intended as an aid in the diagnosis of influenza from Nasopharyngeal swab specimens and should not be used as a sole basis for treatment. Nasal washings and aspirates are unacceptable for Xpert Xpress SARS-CoV-2/FLU/RSV testing.  Fact Sheet for Patients: EntrepreneurPulse.com.au  Fact Sheet for Healthcare Providers: IncredibleEmployment.be  This test is not yet approved or cleared by the [place] FDA and has been authorized for detection and/or diagnosis of SARS-CoV-2 by FDA under an Emergency Use Authorization (EUA). This EUA will remain in effect (meaning this test can be used) for the duration of the COVID-19 declaration under Section 564(b)(1) of the Act, 21 U.S.C. section 360bbb-3(b)(1), unless the  authorization is terminated or revoked.  Performed at [facility], [address] [place], [place] [address]      Radiological Exams on Admission: DG Chest Portable 1 View  Result Date: 11/25/2020 CLINICAL DATA:  eval for cardiomegaly, edeam, infiltrate EXAM: PORTABLE CHEST 1 VIEW COMPARISON:  None. FINDINGS: Large hiatal hernia. Heart is normal size. Linear scarring in the right mid lung and right base. No confluent opacities on the left. No effusions or acute bony abnormality. IMPRESSION: Areas of scarring in the right lung.  No active disease. Large hiatal hernia. Electronically Signed   By: Rolm Baptise M.D.   On: 11/25/2020 11:16   DG Foot Complete Left  Result Date: 11/25/2020 CLINICAL DATA:  Left foot wound for 1 week EXAM: LEFT FOOT - COMPLETE 3+ VIEW COMPARISON:  None. FINDINGS: No evidence of acute fracture. No dislocation. Diffuse osseous demineralization. No definite bony erosion or site of periosteal elevation. There is air seen within the soft tissues at the dorsum of the foot overlying the level of the MTP joints involving the full width of the foot. A small amount of air is seen tracking proximally to the level of the mid metatarsal diaphysis. IMPRESSION: 1. Soft tissue air at the dorsum of the foot overlying the level of the MTP joints with a small amount of air is seen tracking proximally to the level of the mid metatarsal diaphysis. Findings suggestive of soft tissue infection with a gas-forming organism. 2. No radiographic evidence of acute osteomyelitis. Electronically Signed   By: Davina Poke D.O.   On: 11/25/2020 11:19     EKG:  Not done in ED, will get one.   Assessment/Plan Principal Problem:   Left foot infection Active Problems:   COPD (chronic obstructive pulmonary disease) (HCC)   Coronary artery disease   History of CVA with residual deficit   GERD (gastroesophageal reflux disease)   Hyperlipidemia, unspecified   Tobacco abuse   Anxiety and  depression   Essential hypertension   Anemia, iron deficiency   Chronic combined systolic and diastolic CHF (congestive heart failure) (HCC)  Left foot infection with wounds: Patient does not have history of diabetes.  No leukocytosis or fever.  Does not meets criteria for sepsis.  Patient has very weak pulse, indicating PAD.  X-ray findings are suggestive of soft tissue infection with gas-forming organism. Dr. Lucky Cowboy of [facility] and Dr. Luana Shu of podiatry are consulted.  - Admitted to MedSurg bed as inpatient - Empiric antimicrobial treatment with vancomycin, Flagyl, Rocephin - PRN Zofran for nausea, morphine and Percocet for pain - Blood cultures x 2  - ESR and CRP - wound care consult - MRI-left foot   COPD (chronic obstructive pulmonary disease) ([place]):: Stable -Bronchodilators  Coronary artery disease: No chest pain -Hold Plavix since patient may need surgery -Continue Lipitor and coreg  History of CVA with residual deficit: -Hold plavix as above -continue lipitor  GERD (gastroesophageal reflux disease) -Protonix  Hyperlipidemia, unspecified -Lipitor  Tobacco abuse -Nicotine patch  Anxiety and depression -Continue home medications  Essential hypertension -IV hydralazine as needed -Continue lisinopril, Coreg  Anemia, iron deficiency: Hemoglobin 8.3 (12.3 on 09/06/2019), patient denies any dark stool or rectal bleeding. -Check anemia panel -Follow-up with CBC  Chronic combined systolic and diastolic CHF (congestive heart failure) [facility]): Patient has trace leg edema, but no JVD, no shortness of breath, CHF seem to be compensated.  2D echo on 07/24/2017 showed EF 25 to 30%.  BNP 286. -Continue home Lasix 20 mg daily         DVT ppx: SCD Code Status: Full code Family Communication: Yes, patient's  husband  at bed side Disposition Plan:  Anticipate discharge back to previous environment Consults called:  Dr. Lucky Cowboy of [facility] and Dr. Luana Shu of podiatry are consulted. Admission  status and Level of care: Med-Surg:    Med-surg bed as inpt       Status is: Inpatient  Remains inpatient appropriate because:Inpatient level of care appropriate due to severity of illness  Dispo: The patient is from: Home              Anticipated d/c is to: Home              Patient currently is not medically stable to d/c.   Difficult to place patient No            Date of Service 11/25/2020    [facility]   If 7PM-7AM, please contact night-coverage [email or web address] 11/25/2020, 1:16 PM

## 2020-11-25 NOTE — ED Notes (Signed)
Joneen Caraway RN aware of assigned bed

## 2020-11-25 NOTE — ED Notes (Signed)
Called Lab to draw blood.

## 2020-11-25 NOTE — Consult Note (Signed)
WOC Nurse Consult Note: Reason for Consult:critical limb ischemia with nonhealing wounds to left dorsal foot.  Duration of 3 weeks.  MRI pending and podiatry has consulted. Recommendations of daily betadine to open wounds and diagnostic testing to rule out osteomyelitis as well as vascular interventions needs. No further WOC needs at this time.  Wound type:Ischemia Pressure Injury POA: NA Wound QP:[id number] with dried fibrin Drainage (amount, consistency, odor) none noted Periwound:erythema likely due to dependent rubor.  Podiatry recommends elevation Dressing procedure/placement/frequency:Daily betadine to open wounds, per podiatry. Will not follow at this time.  Please re-consult if needed.  Domenic Moras MSN, RN, FNP-BC CWON Wound, Ostomy, Continence Nurse Pager [phone]

## 2020-11-25 NOTE — ED Notes (Signed)
Transport requested

## 2020-11-25 NOTE — ED Notes (Signed)
Transport at bedside  

## 2020-11-25 NOTE — ED Notes (Signed)
Lab at bedside

## 2020-11-25 NOTE — Consult Note (Signed)
Pharmacy Antibiotic Note  Michele Matthews is a 75 y.o. female admitted on 11/25/2020 with cellulitis/left foot wound.  Pharmacy has been consulted for Vancomycin dosing. Patient also on ceftriaxone and metronidazole   Plan: Vancomycin 1000 mg LD given in ED Initiate Vancomycin 1000 mg Q24H 14 hours (1 half-life) following LD due to lower LD given. Goal AUC 400-550 Est AUC: 446 Scr used: 0.8 Follow up cultures, monitor renal functions  Height: '5\' 5"'$  (165.1 cm) Weight: 63.4 kg (139 lb 11.2 oz) IBW/kg (Calculated) : 57  Temp (24hrs), Avg:98 F (36.7 C), Min:98 F (36.7 C), Max:98 F (36.7 C)  Recent Labs  Lab 11/25/20 1023 11/25/20 1024  WBC 8.6  --   CREATININE 0.68  --   LATICACIDVEN  --  0.9    Estimated Creatinine Clearance: 53.8 mL/min (by C-G formula based on SCr of 0.68 mg/dL).    No Known Allergies  Antimicrobials this admission: 7/27 ceftriaxone >>  7/27 metronidazole >>  7/27 Vancomycin >>  Dose adjustments this admission: N/a  Microbiology results: 7/27 BCx: sent 7/27 Surgical wound culture: sent    Thank you for allowing pharmacy to be a part of this patient's care.  Dorothe Pea, PharmD, BCPS Clinical Pharmacist   11/25/2020 12:54 PM

## 2020-11-25 NOTE — ED Notes (Signed)
PA [place] discussing surgical procedure  Last oral intake midnight

## 2020-11-25 NOTE — Consult Note (Signed)
[facility] Vascular Consult Note  MRN : [id number]  Michele Matthews is a 75 y.o. (1945/06/11) female who presents with chief complaint of  Chief Complaint  Patient presents with   Wound Infection   History of Present Illness:  Michele Matthews is a 75 y.o. female with below listed past medical history presents to the ER for evaluation of wounds to the left foot that been present but worsening for the past 3 weeks with increasing pain foul-smelling drainage.  No recent antibiotics.  Denies any history of diabetes does have a history of CAD denies any chest pain or shortness of breath.  Vascular Surgery was consulted by Dr. Luana Shu for possible endovascular intervention.   Current Facility-Administered Medications  Medication Dose Route Frequency Provider Last Rate Last Admin   acetaminophen (TYLENOL) tablet 650 mg  650 mg Oral Q6H PRN Ivor Costa, MD       albuterol (PROVENTIL) (2.5 MG/3ML) 0.083% nebulizer solution 3 mL  3 mL Nebulization Q4H PRN Ivor Costa, MD       Derrill Memo ON 11/26/2020] cefTRIAXone (ROCEPHIN) 1 g in sodium chloride 0.9 % 100 mL IVPB  1 g Intravenous Q24H Ivor Costa, MD       dextromethorphan-guaiFENesin (Aguilar DM) 30-600 MG per 12 hr tablet 1 tablet  1 tablet Oral BID PRN Ivor Costa, MD       hydrALAZINE (APRESOLINE) injection 5 mg  5 mg Intravenous Q2H PRN Ivor Costa, MD       Derrill Memo ON 11/26/2020] metroNIDAZOLE (FLAGYL) IVPB 500 mg  500 mg Intravenous Q12H Ivor Costa, MD       morphine 2 MG/ML injection 1 mg  1 mg Intravenous Q3H PRN Ivor Costa, MD   1 mg at 11/25/20 1226   nicotine (NICODERM CQ - dosed in mg/24 hours) patch 21 mg  21 mg Transdermal Daily Ivor Costa, MD   21 mg at 11/25/20 1226   ondansetron (ZOFRAN) injection 4 mg  4 mg Intravenous Q8H PRN Ivor Costa, MD       oxyCODONE-acetaminophen (PERCOCET/ROXICET) 5-325 MG per tablet 1 tablet  1 tablet Oral Q4H PRN Ivor Costa, MD   1 tablet at 11/25/20 1255   [START ON 11/26/2020] vancomycin  (VANCOCIN) IVPB 1000 mg/200 mL premix  1,000 mg Intravenous Q24H Dorothe Pea, [facility]       Current Outpatient Medications  Medication Sig Dispense Refill   atorvastatin (LIPITOR) 10 MG tablet TAKE 1 TABLET EVERY DAY 90 tablet 0   busPIRone (BUSPAR) 7.5 MG tablet Take 1 tablet (7.5 mg total) by mouth 2 (two) times daily. 180 tablet 3   carvedilol (COREG) 3.125 MG tablet Take 1 tablet (3.125 mg total) by mouth 2 (two) times daily with a meal. 60 tablet 2   clopidogrel (PLAVIX) 75 MG tablet Take 75 mg by mouth daily.     diphenhydramine-acetaminophen (TYLENOL PM) 25-500 MG TABS tablet Take 1 tablet by mouth at bedtime as needed.     furosemide (LASIX) 20 MG tablet Take 1 tablet (20 mg total) by mouth daily. 30 tablet 1   hydrOXYzine (ATARAX/VISTARIL) 10 MG tablet Take 1 tablet (10 mg total) by mouth 2 (two) times daily as needed for anxiety (sleep). 60 tablet 11   lisinopril (ZESTRIL) 5 MG tablet Take 5 mg by mouth daily.     Multiple Vitamin (MULTIVITAMIN) tablet Take 1 tablet by mouth daily.     pantoprazole (PROTONIX) 40 MG tablet TAKE 1 TABLET EVERY DAY 90 tablet 0  sertraline (ZOLOFT) 100 MG tablet Take 1 tablet (100 mg total) by mouth 2 (two) times daily. 180 tablet 3   aspirin EC 81 MG tablet Take 81 mg by mouth daily. (Patient not taking: Reported on 11/25/2020)     feeding supplement, ENSURE ENLIVE, (ENSURE ENLIVE) LIQD Take 237 mLs by mouth 3 (three) times daily between meals. 237 mL 12   Tetrahydrozoline HCl (VISINE OP) Place 1 drop into both eyes 2 (two) times daily as needed (dry eyes).     warfarin (COUMADIN) 5 MG tablet TAKE 1 TABLET ON THURSDAYS & SUNDAYS AND TAKE 1/2 TABLET ON MONDAYS, TUESDAYS, WEDNESDAYS,  FRIDAYS & SATURDAYS (Patient not taking: Reported on 11/25/2020) 59 tablet 0   Past Medical History:  Diagnosis Date   Allergy    COPD (chronic obstructive pulmonary disease) (HCC)    Coronary artery disease    Depression    Emphysema of lung (HCC)    GERD  (gastroesophageal reflux disease)    Hyperlipidemia    Hypertension    Seizures ([place])    Stroke [facility])    Past Surgical History:  Procedure Laterality Date   ESOPHAGOGASTRODUODENOSCOPY N/A 08/10/2015   Procedure: ESOPHAGOGASTRODUODENOSCOPY (EGD);  Surgeon: Manya Silvas, MD;  Location: [facility] ENDOSCOPY;  Service: Endoscopy;  Laterality: N/A;   ESOPHAGOGASTRODUODENOSCOPY (EGD) WITH PROPOFOL N/A 12/11/2014   Procedure: ESOPHAGOGASTRODUODENOSCOPY (EGD) WITH PROPOFOL;  Surgeon: Lucilla Lame, MD;  Location: [facility] ENDOSCOPY;  Service: Endoscopy;  Laterality: N/A;   ESOPHAGOGASTRODUODENOSCOPY (EGD) WITH PROPOFOL N/A 12/29/2015   Procedure: ESOPHAGOGASTRODUODENOSCOPY (EGD) WITH PROPOFOL;  Surgeon: Mauri Pole, MD;  Location: [facility] ENDOSCOPY;  Service: Endoscopy;  Laterality: N/A;   Social History Social History   Tobacco Use   Smoking status: Every Day    Packs/day: 1.00    Types: Cigarettes   Smokeless tobacco: Current  Vaping Use   Vaping Use: Never used  Substance Use Topics   Alcohol use: No   Drug use: No   Family History Family History  Problem Relation Age of Onset   Alzheimer's disease Father    Lung cancer Sister    Breast cancer Sister    Cancer Mother   Denies family history of peripheral artery disease, venous disease or renal disease.  No Known Allergies  REVIEW OF SYSTEMS (Negative unless checked)  Constitutional: '[]'$ Weight loss  '[]'$ Fever  '[]'$ Chills Cardiac: '[]'$ Chest pain   '[]'$ Chest pressure   '[]'$ Palpitations   '[]'$ Shortness of breath when laying flat   '[]'$ Shortness of breath at rest   '[]'$ Shortness of breath with exertion. Vascular:  '[x]'$ Pain in legs with walking   '[x]'$ Pain in legs at rest   '[x]'$ Pain in legs when laying flat   '[]'$ Claudication   '[]'$ Pain in feet when walking  '[]'$ Pain in feet at rest  '[]'$ Pain in feet when laying flat   '[]'$ History of DVT   '[]'$ Phlebitis   '[]'$ Swelling in legs   '[]'$ Varicose veins   '[]'$ Non-healing ulcers Pulmonary:   '[]'$ Uses home oxygen   '[]'$ Productive cough    '[]'$ Hemoptysis   '[]'$ Wheeze  '[]'$ COPD   '[]'$ Asthma Neurologic:  '[]'$ Dizziness  '[]'$ Blackouts   '[]'$ Seizures   '[]'$ History of stroke   '[]'$ History of TIA  '[]'$ Aphasia   '[]'$ Temporary blindness   '[]'$ Dysphagia   '[]'$ Weakness or numbness in arms   '[]'$ Weakness or numbness in legs Musculoskeletal:  '[]'$ Arthritis   '[]'$ Joint swelling   '[]'$ Joint pain   '[]'$ Low back pain Hematologic:  '[]'$ Easy bruising  '[]'$ Easy bleeding   '[]'$ Hypercoagulable state   '[]'$ Anemic  '[]'$ Hepatitis Gastrointestinal:  '[]'$ Blood in stool   '[]'$   Vomiting blood  '[]'$ Gastroesophageal reflux/heartburn   '[]'$ Difficulty swallowing. Genitourinary:  '[]'$ Chronic kidney disease   '[]'$ Difficult urination  '[]'$ Frequent urination  '[]'$ Burning with urination   '[]'$ Blood in urine Skin:  '[]'$ Rashes   '[]'$ Ulcers   '[]'$ Wounds Psychological:  '[]'$ History of anxiety   '[]'$  History of major depression.  Physical Examination  Vitals:   11/25/20 1022 11/25/20 1025  BP:  (!) 171/76  Pulse:  83  Resp:  (!) 22  Temp:  98 F (36.7 C)  TempSrc:  Oral  SpO2:  100%  Weight: 63.4 kg   Height: '5\' 5"'$  (1.651 m)    Body mass index is 23.25 kg/m. Gen:  WD/WN, NAD Head: [place]/AT, No temporalis wasting. Prominent temp pulse not noted. Ear/Nose/Throat: Hearing grossly intact, nares w/o erythema or drainage, oropharynx w/o Erythema/Exudate Eyes: Sclera non-icteric, conjunctiva clear Neck: Trachea midline.  No JVD.  Pulmonary:  Good air movement, respirations not labored, equal bilaterally.  Cardiac: RRR, normal S1, S2. Vascular:  Vessel Right Left  Radial Palpable Palpable  Ulnar Palpable Palpable  Brachial Palpable Palpable  Carotid Palpable, without bruit Palpable, without bruit  Aorta Not palpable N/A  Femoral Palpable Palpable  Popliteal Palpable Palpable  PT Palpable Non-Palpable  DP Palpable Non-Palpable   Left lower extremity: Thigh soft.  Calf soft.  Hard to palpate pedal pulses.  Multiple ulcerations with tendon exposed noted to the dorsum of the left foot.  Motor/sensory is intact.  Gastrointestinal: soft,  non-tender/non-distended. No guarding/reflex.  Musculoskeletal: M/S 5/5 throughout.  Extremities without ischemic changes.  No deformity or atrophy. No edema. Neurologic: Sensation grossly intact in extremities.  Symmetrical.  Speech is fluent. Motor exam as listed above. Psychiatric: Judgment intact, Mood & affect appropriate for pt's clinical situation. Dermatologic: No rashes or ulcers noted.  No cellulitis or open wounds. Lymph : No Cervical, Axillary, or Inguinal lymphadenopathy.  CBC Lab Results  Component Value Date   WBC 8.6 11/25/2020   HGB 8.3 (L) 11/25/2020   HCT 28.6 (L) 11/25/2020   MCV 68.1 (L) 11/25/2020   PLT 373 11/25/2020   BMET    Component Value Date/Time   NA 139 11/25/2020 1023   NA 140 03/28/2014 0441   K 3.9 11/25/2020 1023   K 4.0 03/28/2014 0441   CL 104 11/25/2020 1023   CL 110 (H) 03/28/2014 0441   CO2 25 11/25/2020 1023   CO2 24 03/28/2014 0441   GLUCOSE 101 (H) 11/25/2020 1023   GLUCOSE 86 03/28/2014 0441   BUN 18 11/25/2020 1023   BUN 19 (H) 03/28/2014 0441   CREATININE 0.68 11/25/2020 1023   CREATININE 0.79 11/24/2015 0908   CALCIUM 9.7 11/25/2020 1023   CALCIUM 8.7 03/28/2014 0441   GFRNONAA >60 11/25/2020 1023   GFRNONAA 76 11/24/2015 0908   GFRAA >60 07/28/2017 0507   GFRAA 87 11/24/2015 0908   Estimated Creatinine Clearance: 53.8 mL/min (by C-G formula based on SCr of 0.68 mg/dL).  COAG Lab Results  Component Value Date   INR 1.98 07/28/2017   INR 2.09 07/27/2017   INR 2.00 07/26/2017   Radiology DG Chest Portable 1 View  Result Date: 11/25/2020 CLINICAL DATA:  eval for cardiomegaly, edeam, infiltrate EXAM: PORTABLE CHEST 1 VIEW COMPARISON:  None. FINDINGS: Large hiatal hernia. Heart is normal size. Linear scarring in the right mid lung and right base. No confluent opacities on the left. No effusions or acute bony abnormality. IMPRESSION: Areas of scarring in the right lung.  No active disease. Large hiatal hernia. Electronically  Signed  By: Rolm Baptise M.D.   On: 11/25/2020 11:16   DG Foot Complete Left  Result Date: 11/25/2020 CLINICAL DATA:  Left foot wound for 1 week EXAM: LEFT FOOT - COMPLETE 3+ VIEW COMPARISON:  None. FINDINGS: No evidence of acute fracture. No dislocation. Diffuse osseous demineralization. No definite bony erosion or site of periosteal elevation. There is air seen within the soft tissues at the dorsum of the foot overlying the level of the MTP joints involving the full width of the foot. A small amount of air is seen tracking proximally to the level of the mid metatarsal diaphysis. IMPRESSION: 1. Soft tissue air at the dorsum of the foot overlying the level of the MTP joints with a small amount of air is seen tracking proximally to the level of the mid metatarsal diaphysis. Findings suggestive of soft tissue infection with a gas-forming organism. 2. No radiographic evidence of acute osteomyelitis. Electronically Signed   By: Davina Poke D.O.   On: 11/25/2020 11:19    Assessment/Plan anet C Sarsour is a 75 y.o. female with below listed past medical history presents to the ER for evaluation of wounds to the left foot that been present but worsening for the past 3 weeks with increasing pain foul-smelling drainage.   1.  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  There has been increase in pain as well as foul odor.  Hard to palpate pedal pulses on exam.  Wounds with what looks like tendon showing.  Recommend the patient undergo a left lower extremity angiogram with possible intervention and attempt assess her anatomy and contributing degree of atherosclerotic disease.  If appropriate, attempt to revascularize leg can be made at that time.  Procedure, risks and benefits were explained to the patient and her son who is at the bedside.  All questions were answered.   Both the patient and her son would like to proceed.  We will plan on this this afternoon.  2.  Tobacco abuse: We had a discussion for approximately three minutes regarding the absolute need for smoking cessation due to the deleterious nature of tobacco on the vascular system. We discussed the tobacco use would diminish patency of any intervention, and likely significantly worsen progressio of disease. We discussed multiple agents for quitting including replacement therapy or medications to reduce cravings such as Chantix. The patient voices their understanding of the importance of smoking cessation.   3.  Hyperlipidemia: On aspirin, Plavix and statin for medical management Encouraged good control as its slows the progression of atherosclerotic disease.  Discussed with Dr. Mayme Genta, PA-C  11/25/2020 1:07 PM  This note was created with Dragon medical transcription system.  Any error is purely unintentional.

## 2020-11-25 NOTE — Op Note (Signed)
[facility]  Percutaneous Study/Intervention Procedural Note   Date of Surgery: 11/25/2020  Surgeon(s):Jonty Morrical    Assistants:none  Pre-operative Diagnosis: PAD with ulceration LLE  Post-operative diagnosis:  Same  Procedure(s) Performed:             1.  Ultrasound guidance for vascular access right femoral artery             2.  Catheter placement into left common femoral artery from right femoral approach             3.  Aortogram and selective left lower extremity angiogram             4.  Percutaneous transluminal angioplasty of left peroneal artery and tibioperoneal trunk with 2.5 mm diameter angioplasty balloon             5.  Percutaneous transluminal angioplasty of the left SFA and popliteal arteries with a pair of 4 mm diameter Lutonix drug-coated angioplasty balloon  6.  Viabahn stent placement x2 to the left SFA and popliteal arteries with 6 mm diameter by 15 cm length stent and 6 mm diameter by 25 cm length stent             7.  StarClose closure device right femoral artery  EBL: 10 cc  Contrast: 55 cc  Fluoro Time: 7.3 minutes  Moderate Conscious Sedation Time: approximately 29 minutes using 1.5 mg of Versed and 50 mcg of Fentanyl              Indications:  Patient is a 74 y.o.female with an ischemic left foot with ulcerations and pain.  The patient is brought in for angiography for further evaluation and potential treatment.  Due to the limb threatening nature of the situation, angiogram was performed for attempted limb salvage. The patient is aware that if the procedure fails, amputation would be expected.  The patient also understands that even with successful revascularization, amputation may still be required due to the severity of the situation.  Risks and benefits are discussed and informed consent is obtained.   Procedure:  The patient was identified and appropriate procedural time out was performed.  The patient was then placed supine on  the table and prepped and draped in the usual sterile fashion. Moderate conscious sedation was administered during a face to face encounter with the patient throughout the procedure with my supervision of the RN administering medicines and monitoring the patient's vital signs, pulse oximetry, telemetry and mental status throughout from the start of the procedure until the patient was taken to the recovery room. Ultrasound was used to evaluate the right common femoral artery.  It was patent .  A digital ultrasound image was acquired.  A Seldinger needle was used to access the right common femoral artery under direct ultrasound guidance and a permanent image was performed.  A 0.035 J wire was advanced without resistance and a 5Fr sheath was placed.  Pigtail catheter was placed into the aorta and an AP aortogram was performed. This demonstrated normal renal arteries and normal aorta and iliac segments without significant stenosis although the iliac arteries were quite calcific. I then crossed the aortic bifurcation and advanced to the left femoral head. Selective left lower extremity angiogram was then performed. This demonstrated occlusion of the SFA after a short proximal stump which was diseased.  The common femoral artery profunda femoris artery relatively normal.  There was reconstitution of a diseased popliteal artery and there was disease  that appeared relatively high-grade down into the tibioperoneal trunk and the proximal portion of the peroneal artery which was essentially the only good runoff distally.  There is a small anterior tibial and posterior tibial artery but these did not appear to continue to the foot.. It was felt that it was in the patient's best interest to proceed with intervention after these images to avoid a second procedure and a larger amount of contrast and fluoroscopy based off of the findings from the initial angiogram. The patient was systemically heparinized and a 6 [place] An[employer]  sheath was then placed over the [employer] wire. I then used a Kumpe catheter and the advantage wire to get down into the SFA occlusion.  This is very densely calcific and I had to exchange for a smaller profile Ferd Hibbs cross catheter and the advantage wire but eventually was able to reenter the true lumen in the peroneal artery distally.  I confirmed intraluminal flow in the mid peroneal artery.  I then placed a 0.018 wire down into the distal peroneal artery.  A 2.5 mm diameter by 30 cm length angioplasty balloon was used to treat the tibioperoneal trunk and the proximal and mid peroneal artery inflating this to 12 atm for 1 minute.  This was used to predilate the SFA and popliteal lesions.  I then used 4 mm diameter by 30 mm diameter by 22 cm length Lutonix drug-coated angioplasty balloon to inflate from the below-knee popliteal artery up to the common femoral artery encompassing the entire SFA.  These inflations were 8 to 10 atm for a minute.  Completion imaging showed diffuse residual disease throughout the SFA and popliteal arteries and 2 Viabahn stents were placed.  A 6 mm diameter by 25 cm length Viabahn stent and a 6 mm diameter by 15 cm Viabahn stent were deployed from proximal SFA down to the mid popliteal artery.  These were postdilated with 4 mm balloon with excellent angiographic completion result and less than 20% residual stenosis.  It should be noted that there was difficulty with the procedure due to continuous patient motion and combativeness despite sedation so the image quality was extremely poor and further attempts at treating tibial disease could not be performed due to her combativeness. I elected to terminate the procedure. The sheath was removed and StarClose closure device was deployed in the right femoral artery with excellent hemostatic result. The patient was taken to the recovery room in stable condition having tolerated the procedure well.  Findings:               Aortogram:   This demonstrated normal renal arteries and normal aorta and iliac segments without significant stenosis although the iliac arteries were quite calcific             Left lower Extremity: Occlusion of the SFA after a short proximal stump which was diseased.  The common femoral artery profunda femoris artery relatively normal.  There was reconstitution of a diseased popliteal artery and there was disease that appeared relatively high-grade down into the tibioperoneal trunk and the proximal portion of the peroneal artery which was essentially the only good runoff distally.  There is a small anterior tibial and posterior tibial artery but these did not appear to continue to the foot.   Disposition: Patient was taken to the recovery room in stable condition having tolerated the procedure well.  Complications: None  Leotis Pain 11/25/2020 5:06 PM   This note was created with Dragon Medical transcription  system. Any errors in dictation are purely unintentional.

## 2020-11-25 NOTE — ED Provider Notes (Signed)
[facility] Emergency Department Provider Note    Event Date/Time   First MD Initiated Contact with Patient 11/25/20 1021     (approximate)  I have reviewed the triage vital signs and the nursing notes.   HISTORY  Chief Complaint Wound Infection    HPI Michele Matthews is a 75 y.o. female with below listed past medical history presents to the ER for evaluation of wounds to the left foot that been present but worsening for the past 3 weeks with increasing pain foul-smelling drainage.  No recent antibiotics.  Denies any history of diabetes does have a history of CAD denies any chest pain or shortness of breath.  Past Medical History:  Diagnosis Date   Allergy    COPD (chronic obstructive pulmonary disease) ([place])    Coronary artery disease    Depression    Emphysema of lung (HCC)    GERD (gastroesophageal reflux disease)    Hyperlipidemia    Hypertension    Seizures ([place])    Stroke ([place])    Family History  Problem Relation Age of Onset   Alzheimer's disease Father    Lung cancer Sister    Breast cancer Sister    Cancer Mother    Past Surgical History:  Procedure Laterality Date   ESOPHAGOGASTRODUODENOSCOPY N/A 08/10/2015   Procedure: ESOPHAGOGASTRODUODENOSCOPY (EGD);  Surgeon: Manya Silvas, MD;  Location: [facility] ENDOSCOPY;  Service: Endoscopy;  Laterality: N/A;   ESOPHAGOGASTRODUODENOSCOPY (EGD) WITH PROPOFOL N/A 12/11/2014   Procedure: ESOPHAGOGASTRODUODENOSCOPY (EGD) WITH PROPOFOL;  Surgeon: Lucilla Lame, MD;  Location: [facility] ENDOSCOPY;  Service: Endoscopy;  Laterality: N/A;   ESOPHAGOGASTRODUODENOSCOPY (EGD) WITH PROPOFOL N/A 12/29/2015   Procedure: ESOPHAGOGASTRODUODENOSCOPY (EGD) WITH PROPOFOL;  Surgeon: Mauri Pole, MD;  Location: [facility] ENDOSCOPY;  Service: Endoscopy;  Laterality: N/A;   Patient Active Problem List   Diagnosis Date Noted   CHF (congestive heart failure) ([place]) 07/24/2017   Severe protein-calorie malnutrition ([place])  04/01/2016   Anemia, iron deficiency    Pressure ulcer 12/27/2015   Essential hypertension 12/27/2015   Anemia 10/26/2015   COPD (chronic obstructive pulmonary disease) ([place]) 06/19/2015   Coronary artery disease 06/19/2015   GERD (gastroesophageal reflux disease) 06/19/2015   Hyperlipidemia, unspecified 06/19/2015   Long term current use of anticoagulant therapy 06/19/2015   Anxiety and depression 06/19/2015   Symptomatic anemia 12/10/2014   Upper GI bleed    History of CVA with residual deficit 12/10/2013   Hemianopsia 12/10/2013   Left-sided weakness 12/10/2013   Tobacco abuse 12/10/2013      Prior to Admission medications   Medication Sig Start Date End Date Taking? Authorizing Provider  aspirin EC 81 MG tablet Take 81 mg by mouth daily.    [provider]  atorvastatin (LIPITOR) 10 MG tablet TAKE 1 TABLET EVERY DAY 03/31/17   Mikey College, NP  busPIRone (BUSPAR) 7.5 MG tablet Take 1 tablet (7.5 mg total) by mouth 2 (two) times daily. 11/03/16   Karamalegos, Devonne Doughty, DO  carvedilol (COREG) 3.125 MG tablet Take 1 tablet (3.125 mg total) by mouth 2 (two) times daily with a meal. 07/27/17   Gladstone Lighter, MD  diphenhydramine-acetaminophen (TYLENOL PM) 25-500 MG TABS tablet Take 1 tablet by mouth at bedtime as needed.    [provider]  feeding supplement, ENSURE ENLIVE, (ENSURE ENLIVE) LIQD Take 237 mLs by mouth 3 (three) times daily between meals. 07/27/17   Gladstone Lighter, MD  furosemide (LASIX) 20 MG tablet Take 1 tablet (20 mg  total) by mouth daily. 07/27/17   Gladstone Lighter, MD  hydrOXYzine (ATARAX/VISTARIL) 10 MG tablet Take 1 tablet (10 mg total) by mouth 2 (two) times daily as needed for anxiety (sleep). 11/03/16   Olin Hauser, DO  Multiple Vitamin (MULTIVITAMIN) tablet Take 1 tablet by mouth daily.    [provider]  pantoprazole (PROTONIX) 40 MG tablet TAKE 1 TABLET EVERY DAY 03/31/17   Mikey College, NP   sertraline (ZOLOFT) 100 MG tablet Take 1 tablet (100 mg total) by mouth 2 (two) times daily. 07/15/16   Karamalegos, Devonne Doughty, DO  Tetrahydrozoline HCl (VISINE OP) Place 1 drop into both eyes 2 (two) times daily as needed (dry eyes).    [provider]  warfarin (COUMADIN) 5 MG tablet TAKE 1 TABLET ON [place] 1/2 TABLET ON MONDAYS, [place], [place],  [place] Patient taking differently: Take 2.5-5 mg by mouth one time only at 6 PM.  03/31/17   Mikey College, NP    Allergies Patient has no known allergies.    Social History Social History   Tobacco Use   Smoking status: Every Day    Packs/day: 1.00    Types: Cigarettes   Smokeless tobacco: Current  Vaping Use   Vaping Use: Never used  Substance Use Topics   Alcohol use: No   Drug use: No    Review of Systems Patient denies headaches, rhinorrhea, blurry vision, numbness, shortness of breath, chest pain, edema, cough, abdominal pain, nausea, vomiting, diarrhea, dysuria, fevers, rashes or hallucinations unless otherwise stated above in HPI. ____________________________________________   PHYSICAL EXAM:  VITAL SIGNS: Vitals:   11/25/20 1025  BP: (!) 171/76  Pulse: 83  Resp: (!) 22  Temp: 98 F (36.7 C)  SpO2: 100%    Constitutional: Alert and oriented.  Eyes: Conjunctivae are normal.  Head: Atraumatic. Nose: No congestion/rhinnorhea. Mouth/Throat: Mucous membranes are moist.   Neck: No stridor. Painless ROM.  Cardiovascular: Normal rate, regular rhythm. Grossly normal heart sounds.  Good peripheral circulation. Respiratory: Normal respiratory effort.  No retractions. Lungs CTAB. Gastrointestinal: Soft and nontender. No distention. No abdominal bruits. No CVA tenderness. Genitourinary:  Musculoskeletal: Cellulitis with purulent foul-smelling drainage and ulceration on the dorsal aspect of the left distal foot with central area of necrosis.  Unable to palpate DP or  PT pulses but biphasic Doppler signals present to PT and DP.  No joint effusions. Neurologic:  Normal speech and language. No gross focal neurologic deficits are appreciated. No facial droop Skin:  Skin is warm, dry and intact. No rash noted. Psychiatric: anxious and tearful  ____________________________________________   LABS (all labs ordered are listed, but only abnormal results are displayed)  Results for orders placed or performed during the hospital encounter of 11/25/20 (from the past 24 hour(s))  CBC with Differential/Platelet     Status: Abnormal   Collection Time: 11/25/20 10:23 AM  Result Value Ref Range   WBC 8.6 4.0 - 10.5 K/uL   RBC 4.20 3.87 - 5.11 MIL/uL   Hemoglobin 8.3 (L) 12.0 - 15.0 g/dL   HCT 28.6 (L) 36.0 - 46.0 %   MCV 68.1 (L) 80.0 - 100.0 fL   MCH 19.8 (L) 26.0 - 34.0 pg   MCHC 29.0 (L) 30.0 - 36.0 g/dL   RDW 18.9 (H) 11.5 - 15.5 %   Platelets 373 150 - 400 K/uL   nRBC 0.0 0.0 - 0.2 %   Neutrophils Relative % 79 %   Neutro  Abs 6.8 1.7 - 7.7 K/uL   Lymphocytes Relative 11 %   Lymphs Abs 0.9 0.7 - 4.0 K/uL   Monocytes Relative 7 %   Monocytes Absolute 0.6 0.1 - 1.0 K/uL   Eosinophils Relative 3 %   Eosinophils Absolute 0.2 0.0 - 0.5 K/uL   Basophils Relative 0 %   Basophils Absolute 0.0 0.0 - 0.1 K/uL   Immature Granulocytes 0 %   Abs Immature Granulocytes 0.02 0.00 - 0.07 K/uL  Comprehensive metabolic panel     Status: Abnormal   Collection Time: 11/25/20 10:23 AM  Result Value Ref Range   Sodium 139 135 - 145 mmol/L   Potassium 3.9 3.5 - 5.1 mmol/L   Chloride 104 98 - 111 mmol/L   CO2 25 22 - 32 mmol/L   Glucose, Bld 101 (H) 70 - 99 mg/dL   BUN 18 8 - 23 mg/dL   Creatinine, Ser 0.68 0.44 - 1.00 mg/dL   Calcium 9.7 8.9 - 10.3 mg/dL   Total Protein 7.1 6.5 - 8.1 g/dL   Albumin 3.6 3.5 - 5.0 g/dL   AST 23 15 - 41 U/L   ALT 15 0 - 44 U/L   Alkaline Phosphatase 74 38 - 126 U/L   Total Bilirubin 0.6 0.3 - 1.2 mg/dL   GFR, Estimated >60 >60  mL/min   Anion gap 10 5 - 15  Lactic acid, plasma     Status: None   Collection Time: 11/25/20 10:24 AM  Result Value Ref Range   Lactic Acid, Venous 0.9 0.5 - 1.9 mmol/L   ____________________________________________ ____________________________________________  RADIOLOGY  I personally reviewed all radiographic images ordered to evaluate for the above acute complaints and reviewed radiology reports and findings.  These findings were personally discussed with the patient.  Please see medical record for radiology report.  ____________________________________________   PROCEDURES  Procedure(s) performed:  Procedures    Critical Care performed: no ____________________________________________   INITIAL IMPRESSION / ASSESSMENT AND PLAN / ED COURSE  Pertinent labs & imaging results that were available during my care of the patient were reviewed by me and considered in my medical decision making (see chart for details).   DDX: cellulitis, abscess, nsti, gangrene, pad  Michele Matthews is a 75 y.o. who presents to the ED with wound presentation as described above.  He is afebrile hemodynamically stable very anxious appearing.  Very foul-smelling ulceration of the foot as described above.  Blood work ordered will order IV antibiotics and x-ray.  Anticipate patient will require hospitalization for IV antibiotics and further management.  Clinical Course as of 11/25/20 1139  Wed Nov 25, 2020  1136 X-ray imaging reviewed.  Does not seem consistent with necrotizing soft tissue infection given duration of symptoms I think is more likely air tracking secondary to deep ulceration.  I discussed case in consultation with Dr. Luana Shu of podiatry who recommends MRI and agrees with plan for admission to the hospital for further evaluation management. [PR]    Clinical Course User Index [PR] Merlyn Lot, MD    The patient was evaluated in Emergency Department today for the symptoms described in  the history of present illness. He/she was evaluated in the context of the global COVID-19 pandemic, which necessitated consideration that the patient might be at risk for infection with the SARS-CoV-2 virus that causes COVID-19. Institutional protocols and algorithms that pertain to the evaluation of patients at risk for COVID-19 are in a state of rapid change based on information released  by regulatory bodies including the CDC and federal and state organizations. These policies and algorithms were followed during the patient's care in the ED.  As part of my medical decision making, I reviewed the following data within the [place] notes reviewed and incorporated, Labs reviewed, notes from prior ED visits and [place] Controlled Substance Database   ____________________________________________   FINAL CLINICAL IMPRESSION(S) / ED DIAGNOSES  Final diagnoses:  Cellulitis of left foot      NEW MEDICATIONS STARTED DURING THIS VISIT:  New Prescriptions   No medications on file     Note:  This document was prepared using Dragon voice recognition software and may include unintentional dictation errors.    Merlyn Lot, MD 11/25/20 1139

## 2020-11-25 NOTE — ED Triage Notes (Signed)
Pt to ED [facility] from home for infection to left foot x1 week.  Pt A&O x4.  NAD noted.  Foul smell noted and drainage

## 2020-11-25 NOTE — ED Notes (Signed)
Pharmacy tech at bedside 

## 2020-11-25 NOTE — Consult Note (Signed)
PODIATRY / FOOT AND ANKLE SURGERY CONSULTATION NOTE  Requesting Physician: Dr. Blaine Hamper  Reason for consult: Left foot wound/infection  Chief Complaint: Left foot wounds, pain   HPI: Michele Matthews is a 75 y.o. female who presents with ulcerative areas to the left dorsal foot that been present for around 3 weeks or maybe longer.  Patient overall is a fairly poor historian.  Patient presents with son today.  Patient denies any history of diabetes but does have a history of CAD.  Patient currently denies nausea, vomiting, fever, chills.  Patient was admitted to [facility] due to wounds present with foul smelling odor and drainage and concerns for limb ischemia.  PMHx:  Past Medical History:  Diagnosis Date   Allergy    COPD (chronic obstructive pulmonary disease) ([place])    Coronary artery disease    Depression    Emphysema of lung (HCC)    GERD (gastroesophageal reflux disease)    Hyperlipidemia    Hypertension    Seizures ([place])    Stroke [facility])     Surgical Hx:  Past Surgical History:  Procedure Laterality Date   ESOPHAGOGASTRODUODENOSCOPY N/A 08/10/2015   Procedure: ESOPHAGOGASTRODUODENOSCOPY (EGD);  Surgeon: Manya Silvas, MD;  Location: [facility] ENDOSCOPY;  Service: Endoscopy;  Laterality: N/A;   ESOPHAGOGASTRODUODENOSCOPY (EGD) WITH PROPOFOL N/A 12/11/2014   Procedure: ESOPHAGOGASTRODUODENOSCOPY (EGD) WITH PROPOFOL;  Surgeon: Lucilla Lame, MD;  Location: [facility] ENDOSCOPY;  Service: Endoscopy;  Laterality: N/A;   ESOPHAGOGASTRODUODENOSCOPY (EGD) WITH PROPOFOL N/A 12/29/2015   Procedure: ESOPHAGOGASTRODUODENOSCOPY (EGD) WITH PROPOFOL;  Surgeon: Mauri Pole, MD;  Location: [facility] ENDOSCOPY;  Service: Endoscopy;  Laterality: N/A;    FHx:  Family History  Problem Relation Age of Onset   Alzheimer's disease Father    Lung cancer Sister    Breast cancer Sister    Cancer Mother     Social History:  reports that she has been smoking. She has been smoking an average of  1 pack per day. She uses smokeless tobacco. She reports that she does not drink alcohol and does not use drugs.  Allergies: No Known Allergies  (Not in a hospital admission)   Physical Exam: General: Alert and oriented.  No apparent distress.  Vascular: DP/PT pulses nonpalpable left lower extremity, faintly palpable right lower extremity.  Capillary fill time delayed to the left forefoot.  Dependent rubor of the left lower extremity that is improved with elevation.  Neuro: Light touch sensation intact to digits both feet.  Derm: Multiple ulcerations present to the left dorsal forefoot over the left hallux.  Tendon appears to be exposed at this level.  Wounds also present over the fourth and fifth metatarsal phalangeal joints with once again tendon appearing to be exposed.  No obvious bone exposure at this time.  Appears to have dry scab formation/eschars over the area of the wounds, mild associated serous drainage, moderate odor, corresponding erythema and edema present once again is reduced with elevation.     MSK: Pain on palpation left forefoot  Results for orders placed or performed during the hospital encounter of 11/25/20 (from the past 48 hour(s))  CBC with Differential/Platelet     Status: Abnormal   Collection Time: 11/25/20 10:23 AM  Result Value Ref Range   WBC 8.6 4.0 - 10.5 K/uL   RBC 4.20 3.87 - 5.11 MIL/uL   Hemoglobin 8.3 (L) 12.0 - 15.0 g/dL    Comment: Reticulocyte Hemoglobin testing may be clinically indicated, consider ordering this additional test [id number]  HCT 28.6 (L) 36.0 - 46.0 %   MCV 68.1 (L) 80.0 - 100.0 fL   MCH 19.8 (L) 26.0 - 34.0 pg   MCHC 29.0 (L) 30.0 - 36.0 g/dL   RDW 18.9 (H) 11.5 - 15.5 %   Platelets 373 150 - 400 K/uL   nRBC 0.0 0.0 - 0.2 %   Neutrophils Relative % 79 %   Neutro Abs 6.8 1.7 - 7.7 K/uL   Lymphocytes Relative 11 %   Lymphs Abs 0.9 0.7 - 4.0 K/uL   Monocytes Relative 7 %   Monocytes Absolute 0.6 0.1 - 1.0 K/uL    Eosinophils Relative 3 %   Eosinophils Absolute 0.2 0.0 - 0.5 K/uL   Basophils Relative 0 %   Basophils Absolute 0.0 0.0 - 0.1 K/uL   Immature Granulocytes 0 %   Abs Immature Granulocytes 0.02 0.00 - 0.07 K/uL    Comment: Performed at [facility], [address] [place], [place] [address]  Comprehensive metabolic panel     Status: Abnormal   Collection Time: 11/25/20 10:23 AM  Result Value Ref Range   Sodium 139 135 - 145 mmol/L   Potassium 3.9 3.5 - 5.1 mmol/L   Chloride 104 98 - 111 mmol/L   CO2 25 22 - 32 mmol/L   Glucose, Bld 101 (H) 70 - 99 mg/dL    Comment: Glucose reference range applies only to samples taken after fasting for at least 8 hours.   BUN 18 8 - 23 mg/dL   Creatinine, Ser 0.68 0.44 - 1.00 mg/dL   Calcium 9.7 8.9 - 10.3 mg/dL   Total Protein 7.1 6.5 - 8.1 g/dL   Albumin 3.6 3.5 - 5.0 g/dL   AST 23 15 - 41 U/L   ALT 15 0 - 44 U/L   Alkaline Phosphatase 74 38 - 126 U/L   Total Bilirubin 0.6 0.3 - 1.2 mg/dL   GFR, Estimated >60 >60 mL/min    Comment: (NOTE) Calculated using the CKD-EPI Creatinine Equation (2021)    Anion gap 10 5 - 15    Comment: Performed at [facility], [address] [place], [place] [address]  Lactic acid, plasma     Status: None   Collection Time: 11/25/20 10:24 AM  Result Value Ref Range   Lactic Acid, Venous 0.9 0.5 - 1.9 mmol/L    Comment: Performed at [facility], [address], [place], [place] [address]  Resp Panel by RT-PCR (Flu A&B, Covid) Nasopharyngeal Swab     Status: None   Collection Time: 11/25/20 10:24 AM   Specimen: Nasopharyngeal Swab; Nasopharyngeal(NP) swabs in vial transport medium  Result Value Ref Range   SARS Coronavirus 2 by RT PCR NEGATIVE NEGATIVE    Comment: (NOTE) SARS-CoV-2 target nucleic acids are NOT DETECTED.  The SARS-CoV-2 RNA is generally detectable in upper respiratory specimens during the acute phase of infection. The lowest concentration of SARS-CoV-2 viral  copies this assay can detect is 138 copies/mL. A negative result does not preclude SARS-Cov-2 infection and should not be used as the sole basis for treatment or other patient management decisions. A negative result may occur with  improper specimen collection/handling, submission of specimen other than nasopharyngeal swab, presence of viral mutation(s) within the areas targeted by this assay, and inadequate number of viral copies(<138 copies/mL). A negative result must be combined with clinical observations, patient history, and epidemiological information. The expected result is Negative.  Fact Sheet for Patients:  EntrepreneurPulse.com.au  Fact Sheet  for Healthcare Providers:  IncredibleEmployment.be  This test is no t yet approved or cleared by the [place] and  has been authorized for detection and/or diagnosis of SARS-CoV-2 by FDA under an Emergency Use Authorization (EUA). This EUA will remain  in effect (meaning this test can be used) for the duration of the COVID-19 declaration under Section 564(b)(1) of the Act, 21 U.S.C.section 360bbb-3(b)(1), unless the authorization is terminated  or revoked sooner.       Influenza A by PCR NEGATIVE NEGATIVE   Influenza B by PCR NEGATIVE NEGATIVE    Comment: (NOTE) The Xpert Xpress SARS-CoV-2/FLU/RSV plus assay is intended as an aid in the diagnosis of influenza from Nasopharyngeal swab specimens and should not be used as a sole basis for treatment. Nasal washings and aspirates are unacceptable for Xpert Xpress SARS-CoV-2/FLU/RSV testing.  Fact Sheet for Patients: EntrepreneurPulse.com.au  Fact Sheet for Healthcare Providers: IncredibleEmployment.be  This test is not yet approved or cleared by the [place] FDA and has been authorized for detection and/or diagnosis of SARS-CoV-2 by FDA under an Emergency Use Authorization (EUA). This EUA will  remain in effect (meaning this test can be used) for the duration of the COVID-19 declaration under Section 564(b)(1) of the Act, 21 U.S.C. section 360bbb-3(b)(1), unless the authorization is terminated or revoked.  Performed at [facility], [address] [place], [place] [address]   Sedimentation rate     Status: Abnormal   Collection Time: 11/25/20 10:24 AM  Result Value Ref Range   Sed Rate 35 (H) 0 - 30 mm/hr    Comment: Performed at [facility], [address] [place], [place] [address]  Brain natriuretic peptide     Status: Abnormal   Collection Time: 11/25/20 10:24 AM  Result Value Ref Range   B Natriuretic Peptide 286.6 (H) 0.0 - 100.0 pg/mL    Comment: Performed at [facility], [address] [place], [place] [address]  Reticulocytes     Status: Abnormal   Collection Time: 11/25/20 10:24 AM  Result Value Ref Range   Retic Ct Pct 1.4 0.4 - 3.1 %   RBC. 4.33 3.87 - 5.11 MIL/uL   Retic Count, Absolute 59.8 19.0 - 186.0 K/uL   Immature Retic Fract 22.8 (H) 2.3 - 15.9 %    Comment: Performed at [facility], [address] [place], [place] [address]   DG Chest Portable 1 View  Result Date: 11/25/2020 CLINICAL DATA:  eval for cardiomegaly, edeam, infiltrate EXAM: PORTABLE CHEST 1 VIEW COMPARISON:  None. FINDINGS: Large hiatal hernia. Heart is normal size. Linear scarring in the right mid lung and right base. No confluent opacities on the left. No effusions or acute bony abnormality. IMPRESSION: Areas of scarring in the right lung.  No active disease. Large hiatal hernia. Electronically Signed   By: Rolm Baptise M.D.   On: 11/25/2020 11:16   DG Foot Complete Left  Result Date: 11/25/2020 CLINICAL DATA:  Left foot wound for 1 week EXAM: LEFT FOOT - COMPLETE 3+ VIEW COMPARISON:  None. FINDINGS: No evidence of acute fracture. No dislocation. Diffuse osseous demineralization. No definite bony erosion or site of periosteal elevation.  There is air seen within the soft tissues at the dorsum of the foot overlying the level of the MTP joints involving the full width of the foot. A small amount of air is seen tracking proximally to the level of the mid metatarsal diaphysis. IMPRESSION: 1. Soft tissue air at the dorsum  of the foot overlying the level of the MTP joints with a small amount of air is seen tracking proximally to the level of the mid metatarsal diaphysis. Findings suggestive of soft tissue infection with a gas-forming organism. 2. No radiographic evidence of acute osteomyelitis. Electronically Signed   By: Davina Poke D.O.   On: 11/25/2020 11:19    Blood pressure (!) 171/76, pulse 83, temperature 98 F (36.7 C), temperature source Oral, resp. rate (!) 22, height '5\' 5"'$  (1.651 m), weight 63.4 kg, SpO2 100 %.   Assessment Ischemic ulcerations to the left dorsal foot Left foot cellulitis PVD with potential critical limb ischemia  Plan -Patient seen and examined. -X-ray imaging reviewed.  No osteomyelitis present.  Cassettes noted in the tissues is likely to be from the wounds with air in these areas.  Clinically does not appear to be gas gangrene. -Patient has dependent rubor that is improved with elevation.  Believe this is not likely a true cellulitis and more so the redness is related to peripheral vascular disease and critical limb ischemia. -Patient's wounds are very painful today on palpation and difficult to examine the depth but it appears that at least tendon is exposed to potentially joint capsule/periosteum. -MRI ordered for further assessment of deeper infection to the left forefoot. -Wound culture taken and sent off. -Recommend Betadine wet-to-dry dressings daily. -Vascular consulted.  Planning for angiogram within the next day. -Will await vascular results and MRI results to determine next treatment course. -Appreciate medicine recommendations for IV antibiotic therapy as well as pain  management.  Caroline More, DPM 11/25/2020, 1:26 PM

## 2020-11-26 ENCOUNTER — Encounter: Payer: Self-pay | Admitting: Vascular Surgery

## 2020-11-26 DIAGNOSIS — D509 Iron deficiency anemia, unspecified: Secondary | ICD-10-CM | POA: Diagnosis not present

## 2020-11-26 DIAGNOSIS — I5042 Chronic combined systolic (congestive) and diastolic (congestive) heart failure: Secondary | ICD-10-CM | POA: Diagnosis not present

## 2020-11-26 DIAGNOSIS — L089 Local infection of the skin and subcutaneous tissue, unspecified: Secondary | ICD-10-CM | POA: Diagnosis not present

## 2020-11-26 DIAGNOSIS — F419 Anxiety disorder, unspecified: Secondary | ICD-10-CM | POA: Diagnosis not present

## 2020-11-26 LAB — GLUCOSE, CAPILLARY: Glucose-Capillary: 109 mg/dL — ABNORMAL HIGH (ref 70–99)

## 2020-11-26 LAB — BASIC METABOLIC PANEL
Anion gap: 6 (ref 5–15)
BUN: 22 mg/dL (ref 8–23)
CO2: 26 mmol/L (ref 22–32)
Calcium: 9.2 mg/dL (ref 8.9–10.3)
Chloride: 105 mmol/L (ref 98–111)
Creatinine, Ser: 0.66 mg/dL (ref 0.44–1.00)
GFR, Estimated: >60 mL/min (ref >60)
Glucose, Bld: 126 mg/dL — ABNORMAL HIGH (ref 70–99)
Potassium: 3.4 mmol/L — ABNORMAL LOW (ref 3.5–5.1)
Sodium: 137 mmol/L (ref 135–145)

## 2020-11-26 LAB — CBC
HCT: 25.2 % — ABNORMAL LOW (ref 36.0–46.0)
Hemoglobin: 7.4 g/dL — ABNORMAL LOW (ref 12.0–15.0)
MCH: 19.9 pg — ABNORMAL LOW (ref 26.0–34.0)
MCHC: 29.4 g/dL — ABNORMAL LOW (ref 30.0–36.0)
MCV: 67.9 fL — ABNORMAL LOW (ref 80.0–100.0)
Platelets: 310 10*3/uL (ref 150–400)
RBC: 3.71 MIL/uL — ABNORMAL LOW (ref 3.87–5.11)
RDW: 19 % — ABNORMAL HIGH (ref 11.5–15.5)
WBC: 10.4 10*3/uL (ref 4.0–10.5)
nRBC: 0 % (ref 0.0–0.2)

## 2020-11-26 MED ORDER — LORAZEPAM 2 MG/ML IJ SOLN
1.0000 mg | Freq: Once | INTRAMUSCULAR | Status: AC
  Administered 2020-11-27: 1 mg via INTRAVENOUS
  Filled 2020-11-26: qty 1

## 2020-11-26 NOTE — Progress Notes (Signed)
[facility] Daily Progress Note  11/25/20:             1.  Ultrasound guidance for vascular access right femoral artery             2.  Catheter placement into left common femoral artery from right femoral approach             3.  Aortogram and selective left lower extremity angiogram             4.  Percutaneous transluminal angioplasty of left peroneal artery and tibioperoneal trunk with 2.5 mm diameter angioplasty balloon             5.  Percutaneous transluminal angioplasty of the left SFA and popliteal arteries with a pair of 4 mm diameter Lutonix drug-coated angioplasty balloon             6.  Viabahn stent placement x2 to the left SFA and popliteal arteries with 6 mm diameter by 15 cm length stent and 6 mm diameter by 25 cm length stent             7.  StarClose closure device right femoral artery  Subjective: Patient more alert this AM. Asking and answering questions appropriately. No acute issues overnight.   Objective: Vitals:   11/25/20 1802 11/25/20 1958 11/26/20 0441 11/26/20 0733  BP:  139/67 (!) 161/72 138/70  Pulse: 85 (!) 102 (!) 109 (!) 105  Resp: '20 17 15 18  '$ Temp:  98.5 F (36.9 C) 98.2 F (36.8 C) 98.9 F (37.2 C)  TempSrc:  Oral Oral   SpO2: 93% 95% 97% 99%  Weight:      Height:        Intake/Output Summary (Last 24 hours) at 11/26/2020 1014 Last data filed at 11/26/2020 [id number] Gross per 24 hour  Intake 896.44 ml  Output 500 ml  Net 396.44 ml   Physical Exam: A&Ox3, NAD CV: RRR Pulmonary: CTA Bilaterally Abdomen: Soft, Non-tender, Non-distended Right Groin:  Access Site: clean, dry and intact Vascular:  Left Lower Extremity: Thigh soft. Calf soft. Extremity warm distally. Unable to palpate pedal pulses. Motor / sensory intact.    Laboratory: CBC    Component Value Date/Time   WBC 10.4 11/26/2020 0517   HGB 7.4 (L) 11/26/2020 0517   HGB 12.1 03/27/2014 1730   HCT 25.2 (L) 11/26/2020 0517   HCT 37.0 03/27/2014 1730   PLT 310  11/26/2020 0517   PLT 175 03/27/2014 1730   BMET    Component Value Date/Time   NA 137 11/26/2020 0517   NA 140 03/28/2014 0441   K 3.4 (L) 11/26/2020 0517   K 4.0 03/28/2014 0441   CL 105 11/26/2020 0517   CL 110 (H) 03/28/2014 0441   CO2 26 11/26/2020 0517   CO2 24 03/28/2014 0441   GLUCOSE 126 (H) 11/26/2020 0517   GLUCOSE 86 03/28/2014 0441   BUN 22 11/26/2020 0517   BUN 19 (H) 03/28/2014 0441   CREATININE 0.66 11/26/2020 0517   CREATININE 0.79 11/24/2015 0908   CALCIUM 9.2 11/26/2020 0517   CALCIUM 8.7 03/28/2014 0441   GFRNONAA >60 11/26/2020 0517   GFRNONAA 76 11/24/2015 0908   GFRAA >60 07/28/2017 0507   GFRAA 87 11/24/2015 0908   Assessment/Planning: The patient is a 75 year old female with multiple medical issues who presented with non healing ulceration to the left foot s/p endovascular intervention POD#1  1) Atherosclerotic Disease with Gangrene: s/p  endovascular intervention POD#1 Podiatry following for possible debridement / amputation Tough case as the patient is at high risk for a more proximal amputation due to such heavy disease. Patient is on ASA, Statin and Plavix for medical management Normal creatinine this AM OK to remove PAD  2) Malnutrition: Will consult nutrition for recommendations in an attempt to maximize healing.  3) Anemia: About a gram drop in hbg this AM Patient seems to be asymptomatic  AM CBC  Discussed with Dr. Ellis Parents Michele Sean PA-C 11/26/2020 10:14 AM

## 2020-11-26 NOTE — Progress Notes (Signed)
[place] [facility] [place] NAME: Michele Matthews    MR#:  [id number]  PCP: [place]:  08-Feb-1946  SUBJECTIVE:  CHIEF COMPLAINT:   Chief Complaint  Patient presents with  . Wound Infection  Reports 8 out of 10 left foot pain and requesting pain medication.  Afraid of getting MRI due to claustrophobia and requesting sedative medication to knock her out.  Husband at bedside REVIEW OF SYSTEMS:  Review of Systems  Constitutional:  Negative for diaphoresis, fever, malaise/fatigue and weight loss.  HENT:  Negative for ear discharge, ear pain, hearing loss, nosebleeds, sore throat and tinnitus.   Eyes:  Negative for blurred vision and pain.  Respiratory:  Negative for cough, hemoptysis, shortness of breath and wheezing.   Cardiovascular:  Negative for chest pain, palpitations, orthopnea and leg swelling.  Gastrointestinal:  Negative for abdominal pain, blood in stool, constipation, diarrhea, heartburn, nausea and vomiting.  Genitourinary:  Negative for dysuria, frequency and urgency.  Musculoskeletal:  Positive for joint pain. Negative for back pain and myalgias.  Skin:  Negative for itching and rash.       Left foot wound/ulcer  Neurological:  Negative for dizziness, tingling, tremors, focal weakness, seizures, weakness and headaches.  Psychiatric/Behavioral:  Negative for depression. The patient is not nervous/anxious.   DRUG ALLERGIES:  No Known Allergies VITALS:  Blood pressure (!) 109/47, pulse 98, temperature 98.6 F (37 C), resp. rate 18, height '5\' 5"'$  (1.651 m), weight 59 kg, SpO2 97 %. PHYSICAL EXAMINATION:  Physical Exam 32 y female lying in bed with acute left foot pain Lungs: Clear to auscultation bilaterally, no wheezing rales rhonchi crepitation Cardio: S1-S2 normal no murmurs Abdomen; soft, benign Extremities: Trace pedal edema bilaterally Neuro: Awake and alert, nonfocal Skin: 3 ulceration present on left dorsal forefoot.  Foul odor.  Serous drainage.  See picture below   LABORATORY PANEL:  Female CBC Recent Labs  Lab 11/26/20 0517  WBC 10.4  HGB 7.4*  HCT 25.2*  PLT 310   ------------------------------------------------------------------------------------------------------------------ Chemistries  Recent Labs  Lab 11/25/20 1023 11/26/20 0517  NA 139 137  K 3.9 3.4*  CL 104 105  CO2 25 26  GLUCOSE 101* 126*  BUN 18 22  CREATININE 0.68 0.66  CALCIUM 9.7 9.2  AST 23  --   ALT 15  --   ALKPHOS 74  --   BILITOT 0.6  --    MEDICATIONS:  Scheduled Meds: . atorvastatin  10 mg Oral Daily  . busPIRone  7.5 mg Oral BID  . carvedilol  3.125 mg Oral BID WC  . clopidogrel  75 mg Oral Daily  . feeding supplement  237 mL Oral TID BM  . furosemide  20 mg Oral Daily  . lisinopril  5 mg Oral Daily  . LORazepam  1 mg Intravenous Once  . multivitamin with minerals  1 tablet Oral Daily  . nicotine  21 mg Transdermal Daily  . pantoprazole  40 mg Oral Daily  . sertraline  100 mg Oral BID   Continuous Infusions: . sodium chloride Stopped (11/26/20 1306)  . cefTRIAXone (ROCEPHIN)  IV 1 g (11/26/20 1104)  . metronidazole 500 mg (11/26/20 1306)  . vancomycin 1,000 mg (11/26/20 0357)   RADIOLOGY:  No results found. ASSESSMENT AND PLAN:  75 y.o. female with medical history significant of HTN, HLD, COPD, stroke on Coumadin, GERD, depression with anxiety, seizure, CAD, sCHF with EF 25-30&, UGIB, anemia, tobacco abuse, HOH, COPD,  who present with left foot pain and left foot wound with infection  Principal Problem:   Left foot infection Active Problems:   COPD (chronic obstructive pulmonary disease) (HCC)   Coronary artery disease   History of CVA with residual deficit   GERD (gastroesophageal reflux disease)   Hyperlipidemia, unspecified   Tobacco abuse   Anxiety and depression   Essential hypertension   Anemia, iron deficiency   Chronic combined systolic and diastolic CHF (congestive heart  failure) (HCC)  Left foot ischemic ulceration Severe atherosclerotic vascular disease with gangrene Patient does not have history of diabetes.  Ruled out for sepsis.  - Dr. Lucky Cowboy of [facility] and Dr. Luana Shu of podiatry are following -S/p endovascular intervention POD 1 -Continue IV vancomycin, Flagyl, Rocephin - PRN Zofran for nausea, morphine and Percocet for pain - Blood cultures neg thus far - wound care RN appreciated. Daily betadine dressing for now. - MRI-left foot per podiatry - may need debridement/amputation depending on MRI results  Hypokalemia Replete and recheck   COPD (chronic obstructive pulmonary disease) ([place]):: Stable -Bronchodilators   Coronary artery disease: No chest pain -Holding Plavix as patient may require debridement/amputation and or further vascular procedure  -Continue Lipitor and coreg   History of CVA with residual deficit: -Hold plavix as above -continue lipitor   GERD (gastroesophageal reflux disease) -Protonix   Hyperlipidemia, unspecified -Lipitor   Tobacco abuse -Nicotine patch   Anxiety and depression -Continue BuSpar, hydroxyzine, Zoloft.   Essential hypertension -IV hydralazine as needed -Continue lisinopril, Coreg   Anemia, iron deficiency: Hemoglobin 8.3 (12.3 on 09/06/2019), patient denies any dark stool or rectal bleeding. -Hemoglobin 7.4 this morning, will transfuse if it drops less than 7.   Chronic combined systolic and diastolic CHF (congestive heart failure) [facility]): Patient has trace leg edema, but no JVD, no shortness of breath, CHF seem to be compensated.  2D echo on 07/24/2017 showed EF 25 to 30%.  BNP 286. -Continue home Lasix 20 mg daily -Strict I's and O's and daily weight  Severe protein calorie malnutrition As evidenced by muscle wasting and her BMI Body mass index is 21.63 kg/m. Dietitian consult  Net IO Since Admission: 411.39 mL [11/26/20 1840]      LOS: 1 day   Consultants: Podiatry Vascular surgery     Antibiotics: IV vancomycin started on 7/27 IV Flagyl started on 7/27 IV Rocephin started on 7/28 Received dose of IV cefazolin in ED on 7/27  Status is: Inpatient  Remains inpatient appropriate because:IV treatments appropriate due to intensity of illness or inability to take PO she will need wound debridement/amputation/further vascular procedure  Dispo: The patient is from: Home              Anticipated d/c is to: Home              Patient currently is not medically stable to d/c.   Difficult to place patient No    DVT prophylaxis:       SCDs Start: 11/25/20 1212     Family Communication: Husband a bedside on 11/26/2020   All the records are reviewed and case discussed with Nursing and [facility] team. Management plans discussed with the patient, family and they are in agreement.  CODE STATUS: Full Code Level of care: Med-Surg  TOTAL TIME TAKING CARE OF THIS PATIENT: 35 minutes.   More than 50% of the time was spent in counseling/coordination of care: YES  POSSIBLE D/C IN 4-5 DAYS, DEPENDING ON CLINICAL CONDITION.   Krysten Veronica  Manuella Ghazi M.D on 11/26/2020 at 6:40 PM  [facility]   CC: Primary care physician; [place]  Note: This dictation was prepared with Dragon dictation along with smaller phrase technology. Any transcriptional errors that result from this process are unintentional.

## 2020-11-26 NOTE — Progress Notes (Signed)
Patient is unable to tolerate prevelon boots. Patient begin having a panic attack after the boots were applied. Dressing change has been completed. Patient was also unable to complete MRI due to anxiety. Patient received pain  and anti-anxiety medication prior to going down to MRI.

## 2020-11-27 ENCOUNTER — Inpatient Hospital Stay: Payer: Medicare HMO

## 2020-11-27 DIAGNOSIS — D500 Iron deficiency anemia secondary to blood loss (chronic): Secondary | ICD-10-CM

## 2020-11-27 DIAGNOSIS — L089 Local infection of the skin and subcutaneous tissue, unspecified: Secondary | ICD-10-CM | POA: Diagnosis not present

## 2020-11-27 DIAGNOSIS — I96 Gangrene, not elsewhere classified: Secondary | ICD-10-CM

## 2020-11-27 DIAGNOSIS — D509 Iron deficiency anemia, unspecified: Secondary | ICD-10-CM | POA: Diagnosis not present

## 2020-11-27 LAB — BASIC METABOLIC PANEL
Anion gap: 7 (ref 5–15)
BUN: 13 mg/dL (ref 8–23)
CO2: 26 mmol/L (ref 22–32)
Calcium: 8.9 mg/dL (ref 8.9–10.3)
Chloride: 103 mmol/L (ref 98–111)
Creatinine, Ser: 0.61 mg/dL (ref 0.44–1.00)
GFR, Estimated: >60 mL/min (ref >60)
Glucose, Bld: 118 mg/dL — ABNORMAL HIGH (ref 70–99)
Potassium: 3.1 mmol/L — ABNORMAL LOW (ref 3.5–5.1)
Sodium: 136 mmol/L (ref 135–145)

## 2020-11-27 LAB — CBC
HCT: 23.2 % — ABNORMAL LOW (ref 36.0–46.0)
Hemoglobin: 6.8 g/dL — ABNORMAL LOW (ref 12.0–15.0)
MCH: 19.6 pg — ABNORMAL LOW (ref 26.0–34.0)
MCHC: 29.3 g/dL — ABNORMAL LOW (ref 30.0–36.0)
MCV: 66.9 fL — ABNORMAL LOW (ref 80.0–100.0)
Platelets: 268 10*3/uL (ref 150–400)
RBC: 3.47 MIL/uL — ABNORMAL LOW (ref 3.87–5.11)
RDW: 18.7 % — ABNORMAL HIGH (ref 11.5–15.5)
WBC: 12.9 10*3/uL — ABNORMAL HIGH (ref 4.0–10.5)
nRBC: 0.2 % (ref 0.0–0.2)

## 2020-11-27 LAB — GLUCOSE, CAPILLARY: Glucose-Capillary: 114 mg/dL — ABNORMAL HIGH (ref 70–99)

## 2020-11-27 LAB — FOLATE: Folate: 82 ng/mL (ref >5.9)

## 2020-11-27 LAB — PREPARE RBC (CROSSMATCH)

## 2020-11-27 MED ORDER — POTASSIUM CHLORIDE CRYS ER 20 MEQ PO TBCR
40.0000 meq | EXTENDED_RELEASE_TABLET | Freq: Once | ORAL | Status: AC
  Administered 2020-11-27: 40 meq via ORAL
  Filled 2020-11-27: qty 2

## 2020-11-27 MED ORDER — SODIUM CHLORIDE 0.9% IV SOLUTION: Freq: Once | INTRAVENOUS | Status: AC

## 2020-11-27 MED ORDER — ASCORBIC ACID 500 MG PO TABS
250.0000 mg | ORAL_TABLET | Freq: Two times a day (BID) | ORAL | Status: DC
  Administered 2020-11-27 – 2020-12-08 (×21): 250 mg via ORAL
  Filled 2020-11-27 (×22): qty 1

## 2020-11-27 MED ORDER — OCUVITE-LUTEIN PO CAPS
1.0000 | ORAL_CAPSULE | Freq: Every day | ORAL | Status: DC
  Administered 2020-11-29 – 2020-12-08 (×8): 1 via ORAL
  Filled 2020-11-27 (×11): qty 1

## 2020-11-27 MED ORDER — FERROUS SULFATE 325 (65 FE) MG PO TABS
325.0000 mg | ORAL_TABLET | Freq: Every day | ORAL | Status: DC
  Administered 2020-11-27 – 2020-12-08 (×12): 325 mg via ORAL
  Filled 2020-11-27 (×12): qty 1

## 2020-11-27 NOTE — Progress Notes (Signed)
PODIATRY / FOOT AND ANKLE SURGERY PROGRESS NOTE  Requesting Physician: Dr. Blaine Hamper  Reason for consult: Left foot wound/infection  Chief Complaint: Left foot wounds, pain   HPI: Michele Matthews is a 75 y.o. female who presents in bed resting after recently being given her pain medicine.  Patient still moaning in pain and complaining.  Patient presents today with husband at bedside.  PMHx:  Past Medical History:  Diagnosis Date   Allergy    COPD (chronic obstructive pulmonary disease) ([place])    Coronary artery disease    Depression    Emphysema of lung (HCC)    GERD (gastroesophageal reflux disease)    Hyperlipidemia    Hypertension    Seizures ([place])    Stroke [facility])     Surgical Hx:  Past Surgical History:  Procedure Laterality Date   ESOPHAGOGASTRODUODENOSCOPY N/A 08/10/2015   Procedure: ESOPHAGOGASTRODUODENOSCOPY (EGD);  Surgeon: Manya Silvas, MD;  [facility] ENDOSCOPY;  Service: Endoscopy;  Laterality: N/A;   ESOPHAGOGASTRODUODENOSCOPY (EGD) WITH PROPOFOL N/A 12/11/2014   Procedure: ESOPHAGOGASTRODUODENOSCOPY (EGD) WITH PROPOFOL;  Surgeon: Lucilla Lame, MD;  Location: [facility] ENDOSCOPY;  Service: Endoscopy;  Laterality: N/A;   ESOPHAGOGASTRODUODENOSCOPY (EGD) WITH PROPOFOL N/A 12/29/2015   Procedure: ESOPHAGOGASTRODUODENOSCOPY (EGD) WITH PROPOFOL;  Surgeon: Mauri Pole, MD;  Location: [facility] ENDOSCOPY;  Service: Endoscopy;  Laterality: N/A;   LOWER EXTREMITY ANGIOGRAPHY Left 11/25/2020   Procedure: Lower Extremity Angiography;  Surgeon: Algernon Huxley, MD;  Location: [facility] CV LAB;  Service: Cardiovascular;  Laterality: Left;    FHx:  Family History  Problem Relation Age of Onset   Alzheimer's disease Father    Lung cancer Sister    Breast cancer Sister    Cancer Mother     Social History:  reports that she has been smoking. She has been smoking an average of 1 pack per day. She uses smokeless tobacco. She reports that she does not drink alcohol and does not use  drugs.  Allergies: No Known Allergies  Medications Prior to Admission  Medication Sig Dispense Refill   atorvastatin (LIPITOR) 10 MG tablet TAKE 1 TABLET EVERY DAY 90 tablet 0   busPIRone (BUSPAR) 7.5 MG tablet Take 1 tablet (7.5 mg total) by mouth 2 (two) times daily. 180 tablet 3   carvedilol (COREG) 3.125 MG tablet Take 1 tablet (3.125 mg total) by mouth 2 (two) times daily with a meal. 60 tablet 2   clopidogrel (PLAVIX) 75 MG tablet Take 75 mg by mouth daily.     diphenhydramine-acetaminophen (TYLENOL PM) 25-500 MG TABS tablet Take 1 tablet by mouth at bedtime as needed.     furosemide (LASIX) 20 MG tablet Take 1 tablet (20 mg total) by mouth daily. 30 tablet 1   hydrOXYzine (ATARAX/VISTARIL) 10 MG tablet Take 1 tablet (10 mg total) by mouth 2 (two) times daily as needed for anxiety (sleep). 60 tablet 11   lisinopril (ZESTRIL) 5 MG tablet Take 5 mg by mouth daily.     Multiple Vitamin (MULTIVITAMIN) tablet Take 1 tablet by mouth daily.     pantoprazole (PROTONIX) 40 MG tablet TAKE 1 TABLET EVERY DAY 90 tablet 0   sertraline (ZOLOFT) 100 MG tablet Take 1 tablet (100 mg total) by mouth 2 (two) times daily. 180 tablet 3   aspirin EC 81 MG tablet Take 81 mg by mouth daily. (Patient not taking: Reported on 11/25/2020)     feeding supplement, ENSURE ENLIVE, (ENSURE ENLIVE) LIQD Take 237 mLs by mouth 3 (three) times  daily between meals. 237 mL 12   Tetrahydrozoline HCl (VISINE OP) Place 1 drop into both eyes 2 (two) times daily as needed (dry eyes).     warfarin (COUMADIN) 5 MG tablet TAKE 1 TABLET ON THURSDAYS & SUNDAYS AND TAKE 1/2 TABLET ON MONDAYS, TUESDAYS, WEDNESDAYS,  [place] (Patient not taking: Reported on 11/25/2020) 59 tablet 0     Physical Exam: General: Alert and oriented.  No apparent distress.  Vascular: DP/PT pulses nonpalpable left lower extremity, faintly palpable right lower extremity.  Capillary fill time delayed to the left forefoot.  Dependent rubor of the left  lower extremity that is improved with elevation.  Neuro: Light touch sensation intact to digits both feet.  Derm: Multiple ulcerations present to the left dorsal forefoot over the left hallux.  Tendon appears to be exposed at this level.  Wounds also present over the fourth and fifth metatarsal phalangeal joints with once again tendon appearing to be exposed.  No obvious bone exposure at this time.  Appears to have dry scab formation/eschars over the area of the wounds but does appear to be more wet today overall consistent with changes to wet gangrene, mild associated serous drainage, moderate odor, corresponding erythema and edema present once again is reduced with elevation.    MSK: Pain on palpation left forefoot  Results for orders placed or performed during the hospital encounter of 11/25/20 (from the past 48 hour(s))  CBC     Status: Abnormal   Collection Time: 11/26/20  5:17 AM  Result Value Ref Range   WBC 10.4 4.0 - 10.5 K/uL   RBC 3.71 (L) 3.87 - 5.11 MIL/uL   Hemoglobin 7.4 (L) 12.0 - 15.0 g/dL    Comment: Reticulocyte Hemoglobin testing may be clinically indicated, consider ordering this additional test [id number]    HCT 25.2 (L) 36.0 - 46.0 %   MCV 67.9 (L) 80.0 - 100.0 fL   MCH 19.9 (L) 26.0 - 34.0 pg   MCHC 29.4 (L) 30.0 - 36.0 g/dL   RDW 19.0 (H) 11.5 - 15.5 %   Platelets 310 150 - 400 K/uL   nRBC 0.0 0.0 - 0.2 %    Comment: Performed at [facility] [address], [place], [place] [id number]  Basic metabolic panel     Status: Abnormal   Collection Time: 11/26/20  5:17 AM  Result Value Ref Range   Sodium 137 135 - 145 mmol/L   Potassium 3.4 (L) 3.5 - 5.1 mmol/L   Chloride 105 98 - 111 mmol/L   CO2 26 22 - 32 mmol/L   Glucose, Bld 126 (H) 70 - 99 mg/dL    Comment: Glucose reference range applies only to samples taken after fasting for at least 8 hours.   BUN 22 8 - 23 mg/dL   Creatinine, Ser 0.66 0.44 - 1.00 mg/dL   Calcium 9.2 8.9 - 10.3 mg/dL    GFR, Estimated >60 >60 mL/min    Comment: (NOTE) Calculated using the CKD-EPI Creatinine Equation (2021)    Anion gap 6 5 - 15    Comment: Performed at [facility], [address] [place], [place] [address]  Glucose, capillary     Status: Abnormal   Collection Time: 11/26/20  7:44 AM  Result Value Ref Range   Glucose-Capillary 109 (H) 70 - 99 mg/dL    Comment: Glucose reference range applies only to samples taken after fasting for at least 8 hours.  Basic metabolic panel  Status: Abnormal   Collection Time: 11/27/20  4:43 AM  Result Value Ref Range   Sodium 136 135 - 145 mmol/L   Potassium 3.1 (L) 3.5 - 5.1 mmol/L   Chloride 103 98 - 111 mmol/L   CO2 26 22 - 32 mmol/L   Glucose, Bld 118 (H) 70 - 99 mg/dL    Comment: Glucose reference range applies only to samples taken after fasting for at least 8 hours.   BUN 13 8 - 23 mg/dL   Creatinine, Ser 0.61 0.44 - 1.00 mg/dL   Calcium 8.9 8.9 - 10.3 mg/dL   GFR, Estimated >60 >60 mL/min    Comment: (NOTE) Calculated using the CKD-EPI Creatinine Equation (2021)    Anion gap 7 5 - 15    Comment: Performed at [facility], [address] [place], [place] [address]  CBC     Status: Abnormal   Collection Time: 11/27/20  4:43 AM  Result Value Ref Range   WBC 12.9 (H) 4.0 - 10.5 K/uL   RBC 3.47 (L) 3.87 - 5.11 MIL/uL   Hemoglobin 6.8 (L) 12.0 - 15.0 g/dL    Comment: Reticulocyte Hemoglobin testing may be clinically indicated, consider ordering this additional test [id number]    HCT 23.2 (L) 36.0 - 46.0 %   MCV 66.9 (L) 80.0 - 100.0 fL   MCH 19.6 (L) 26.0 - 34.0 pg   MCHC 29.3 (L) 30.0 - 36.0 g/dL   RDW 18.7 (H) 11.5 - 15.5 %   Platelets 268 150 - 400 K/uL   nRBC 0.2 0.0 - 0.2 %    Comment: Performed at [facility], [address] [place], [place] [address]  Glucose, capillary     Status: Abnormal   Collection Time: 11/27/20  7:49 AM  Result Value Ref Range   Glucose-Capillary 114 (H) 70 - 99  mg/dL    Comment: Glucose reference range applies only to samples taken after fasting for at least 8 hours.  Prepare RBC (crossmatch)     Status: None   Collection Time: 11/27/20 11:00 AM  Result Value Ref Range   Order Confirmation      ORDER PROCESSED BY BLOOD BANK Performed at [facility], [address] [place], [place] [address]   Type and screen [place]     Status: None (Preliminary result)   Collection Time: 11/27/20 11:02 AM  Result Value Ref Range   ABO/RH(D) O NEG    Antibody Screen NEG    Sample Expiration 11/30/2020,2359    Unit Number [id number]    Blood Component Type RED CELLS,LR    Unit division 00    Status of Unit ALLOCATED    Transfusion Status OK TO TRANSFUSE    Crossmatch Result      COMPATIBLE Performed at [facility], [address], [place], [place] [address]    PERIPHERAL VASCULAR CATHETERIZATION  Result Date: 11/25/2020 See surgical note for result.   Blood pressure 108/83, pulse (!) 114, temperature 99.8 F (37.7 C), temperature source Oral, resp. rate 16, height '5\' 5"'$  (1.651 m), weight 59 kg, SpO2 97 %.   Assessment Ischemic ulcerations to the left dorsal foot, gangrene Left foot cellulitis PVD with potential critical limb ischemia  Plan -Patient seen and examined. -X-ray imaging reviewed and discussed with patient in detail previously.  No osteomyelitis present. -MRI still pending.  If unable to be performed today then would recommend canceling the MRI as patient will need TMA or more  proximal amputation regardless. -Patient's wounds are very painful today on palpation and difficult to examine the depth but it appears that at least tendon is exposed to potentially joint capsule/periosteum.  Appears to be more wet today consistent with more of a transition from dry to wet gangrene. -Wound culture taken, results pending -Recommend Betadine wet-to-dry dressings daily. -Vascular planning for  angiogram on Monday with intervention.  Currently as it stands if blood flow cannot be improved then patient is likely to end up with a more proximal amputation then just transmetatarsal amputation.  Wounds appear to be progressing rapidly in a negative direction.  Could attempt a transmetatarsal amputation but believe in the current state is not likely to heal.  Discussed this with patient and patient's husband in detail. -Appreciate medicine recommendations for IV antibiotic therapy as well as pain management.  Caroline More, DPM 11/27/2020, 1:07 PM

## 2020-11-27 NOTE — Progress Notes (Signed)
Pt stated she did not want to go to MRI d/t anxiety. IV morphine and ativan given to patient prior to going to MRI. Pt went down to MRI but would not remain still for imaging. MRI tech returned pt to room on unit. Unable to obtain images.

## 2020-11-27 NOTE — Progress Notes (Addendum)
[name]    MR#:  [id number]  PCP: [place]:  02/20/1946  SUBJECTIVE:  CHIEF COMPLAINT:   Chief Complaint  Patient presents with  . Wound Infection  Pain is much better control, She slept well last night.  Mentions having another angiogram on Monday.  Husband at bedside appreciative of her care.  Hemoglobin dropped 6.8, tachycardic REVIEW OF SYSTEMS:  Review of Systems  Constitutional:  Negative for diaphoresis, fever, malaise/fatigue and weight loss.  HENT:  Negative for ear discharge, ear pain, hearing loss, nosebleeds, sore throat and tinnitus.   Eyes:  Negative for blurred vision and pain.  Respiratory:  Negative for cough, hemoptysis, shortness of breath and wheezing.   Cardiovascular:  Negative for chest pain, palpitations, orthopnea and leg swelling.  Gastrointestinal:  Negative for abdominal pain, blood in stool, constipation, diarrhea, heartburn, nausea and vomiting.  Genitourinary:  Negative for dysuria, frequency and urgency.  Musculoskeletal:  Positive for joint pain. Negative for back pain and myalgias.  Skin:  Negative for itching and rash.       Left foot wound/ulcer  Neurological:  Negative for dizziness, tingling, tremors, focal weakness, seizures, weakness and headaches.  Psychiatric/Behavioral:  Negative for depression. The patient is not nervous/anxious.   DRUG ALLERGIES:  No Known Allergies VITALS:  Blood pressure 108/83, pulse (!) 114, temperature 99.8 F (37.7 C), temperature source Oral, resp. rate 16, height '5\' 5"'$  (1.651 m), weight 59 kg, SpO2 97 %. PHYSICAL EXAMINATION:  Physical Exam 13 y female lying in bed with acute left foot pain Lungs: Clear to auscultation bilaterally, no wheezing rales rhonchi crepitation Cardio: S1-S2 normal no murmurs Abdomen; soft, benign Extremities: Trace pedal edema bilaterally Neuro: Awake and alert, nonfocal Skin: 3 ulceration present  on left dorsal forefoot. Foul odor.  Serous drainage.  See picture below   LABORATORY PANEL:  Female CBC Recent Labs  Lab 11/27/20 0443  WBC 12.9*  HGB 6.8*  HCT 23.2*  PLT 268    ------------------------------------------------------------------------------------------------------------------ Chemistries  Recent Labs  Lab 11/25/20 1023 11/26/20 0517 11/27/20 0443  NA 139   < > 136  K 3.9   < > 3.1*  CL 104   < > 103  CO2 25   < > 26  GLUCOSE 101*   < > 118*  BUN 18   < > 13  CREATININE 0.68   < > 0.61  CALCIUM 9.7   < > 8.9  AST 23  --   --   ALT 15  --   --   ALKPHOS 74  --   --   BILITOT 0.6  --   --    < > = values in this interval not displayed.    MEDICATIONS:  Scheduled Meds: . sodium chloride   Intravenous Once  . atorvastatin  10 mg Oral Daily  . busPIRone  7.5 mg Oral BID  . carvedilol  3.125 mg Oral BID WC  . clopidogrel  75 mg Oral Daily  . feeding supplement  237 mL Oral TID BM  . furosemide  20 mg Oral Daily  . lisinopril  5 mg Oral Daily  . multivitamin with minerals  1 tablet Oral Daily  . nicotine  21 mg Transdermal Daily  . pantoprazole  40 mg Oral Daily  . sertraline  100 mg Oral BID   Continuous Infusions: . sodium chloride Stopped (11/26/20 1306)  . cefTRIAXone (ROCEPHIN)  IV 1 g (11/27/20 1002)  . metronidazole 500 mg (11/26/20 2326)  . vancomycin 1,000 mg (11/27/20 0531)   RADIOLOGY:  No results found. ASSESSMENT AND PLAN:  75 y.o. female with medical history significant of HTN, HLD, COPD, stroke on Coumadin, GERD, depression with anxiety, seizure, CAD, sCHF with EF 25-30&, UGIB, anemia, tobacco abuse, HOH, COPD, who present with left foot pain and left foot wound with infection  Principal Problem:   Left foot infection Active Problems:   COPD (chronic obstructive pulmonary disease) (HCC)   Coronary artery disease   History of CVA with residual deficit   GERD (gastroesophageal reflux disease)   Hyperlipidemia, unspecified    Tobacco abuse   Anxiety and depression   Essential hypertension   Anemia, iron deficiency   Chronic combined systolic and diastolic CHF (congestive heart failure) (HCC)  Left foot ischemic ulceration Severe atherosclerotic vascular disease with gangrene Patient does not have history of diabetes.  Ruled out for sepsis.  - Dr. Lucky Cowboy and Dr. Luana Shu of podiatry are following -S/p endovascular intervention POD 2.  Repeat angiogram on Monday 8/1 with general anesthesia per vascular to improve blood flow to the area -Continue IV vancomycin, Rocephin.  We will stop IV Flagyl today on 7/29.  Await wound culture to narrow antibiotics further - PRN Zofran for nausea, morphine and Percocet for pain - Blood cultures neg thus far - wound care RN appreciated. Daily betadine dressing for now. - MRI-left foot per podiatry still pending- may need debridement/amputation depending on MRI results -Vascular surgery concerned about possible drug abuse.  Will order urine drug screen  Anemia, iron deficiency: Hemoglobin 8.3 on admission (12.3 on 09/06/2019), patient denies any dark stool or rectal bleeding. -Hemoglobin 6.8 this morning, will transfuse 1 PRBC today -Monitor H&H. -Patient has sinus tachycardia likely due to severe anemia.  Hypokalemia Replete and recheck   COPD (chronic obstructive pulmonary disease) ([place]):: Stable -Bronchodilators   Coronary artery disease: No chest pain -Plavix resumed on 7/28 post angio -Continue Lipitor and coreg   History of CVA with residual deficit: -Continue Plavix and Lipitor for now   GERD (gastroesophageal reflux disease) -Protonix   Hyperlipidemia, unspecified -Lipitor   Tobacco abuse -Nicotine patch   Anxiety and depression -Continue BuSpar, hydroxyzine, Zoloft.   Essential hypertension -IV hydralazine as needed -Continue lisinopril, Coreg   Chronic combined systolic and diastolic CHF (congestive heart failure) [facility]): Patient has trace leg edema, but  no JVD, no shortness of breath, CHF seem to be compensated.  2D echo on 07/24/2017 showed EF 25 to 30%.  BNP 286. -Continue home Lasix 20 mg daily -Strict I's and O's and daily weight  Severe protein calorie malnutrition As evidenced by muscle wasting and her BMI Body mass index is 21.63 kg/m. Dietitian consult  Net IO Since Admission: 111.39 mL [11/27/20 1206]      LOS: 2 days   Consultants: Podiatry Vascular surgery    Antibiotics: IV vancomycin started on 7/27 IV Flagyl - 7/27 >> 7/29 IV Rocephin started on 7/28 Received dose of IV cefazolin in ED on 7/27  Status is: Inpatient  Remains inpatient appropriate because:IV treatments appropriate due to intensity of illness or inability to take PO she will need wound debridement/amputation/repeat angio scheduled for Monday on 8/1  Dispo: The patient is from: Home              Anticipated d/c is to: Home              Patient currently  is not medically stable to d/c.   Difficult to place patient No    DVT prophylaxis:       SCDs Start: 11/25/20 1212     Family Communication: Husband a bedside on 11/27/2020   All the records are reviewed and case discussed with Nursing and [facility] team. Management plans discussed with the patient, family and they are in agreement.  CODE STATUS: Full Code Level of care: Med-Surg  TOTAL TIME TAKING CARE OF THIS PATIENT: 35 minutes.   More than 50% of the time was spent in counseling/coordination of care: YES  POSSIBLE D/C IN 4-5 DAYS, DEPENDING ON CLINICAL CONDITION.  And further vascular/podiatry intervention   Max Sane M.D on 11/27/2020 at 12:06 PM  [facility]   CC: Primary care physician; [place]  Note: This dictation was prepared with Dragon dictation along with smaller phrase technology. Any transcriptional errors that result from this process are unintentional.

## 2020-11-27 NOTE — Progress Notes (Signed)
[facility] Daily Progress Note   11/25/20:             1.  Ultrasound guidance for vascular access right femoral artery             2.  Catheter placement into left common femoral artery from right femoral approach             3.  Aortogram and selective left lower extremity angiogram             4.  Percutaneous transluminal angioplasty of left peroneal artery and tibioperoneal trunk with 2.5 mm diameter angioplasty balloon             5.  Percutaneous transluminal angioplasty of the left SFA and popliteal arteries with a pair of 4 mm diameter Lutonix drug-coated angioplasty balloon             6.  Viabahn stent placement x2 to the left SFA and popliteal arteries with 6 mm diameter by 15 cm length stent and 6 mm diameter by 25 cm length stent             7.  StarClose closure device right femoral artery  Subjective: Patient sitting up in bed eating. Son and daughter at the bedside. No complaints this AM.   Objective: Vitals:   11/26/20 1526 11/26/20 2028 11/27/20 0439 11/27/20 0750  BP: (!) 109/47 113/67 137/63 108/83  Pulse: 98 (!) 103 (!) 105 (!) 114  Resp: '18 17 16 16  '$ Temp: 98.6 F (37 C) 98.9 F (37.2 C) 98.7 F (37.1 C) 99.8 F (37.7 C)  TempSrc:  Oral  Oral  SpO2: 97% 95% 92% 97%  Weight:      Height:        Intake/Output Summary (Last 24 hours) at 11/27/2020 1102 Last data filed at 11/27/2020 0430 Gross per 24 hour  Intake 14.95 ml  Output 300 ml  Net -285.05 ml   Physical Exam: A&Ox3, NAD CV: RRR Pulmonary: CTA Bilaterally Abdomen: Soft, Non-tender, Non-distended Right Groin:             Access Site: clean, dry and intact Vascular:             Left Lower Extremity: Thigh soft. Calf soft. Extremity warm distally. Unable to palpate pedal pulses. Motor / sensory intact.    Laboratory: CBC    Component Value Date/Time   WBC 12.9 (H) 11/27/2020 0443   HGB 6.8 (L) 11/27/2020 0443   HGB 12.1 03/27/2014 1730   HCT 23.2 (L) 11/27/2020 0443    HCT 37.0 03/27/2014 1730   PLT 268 11/27/2020 0443   PLT 175 03/27/2014 1730   BMET    Component Value Date/Time   NA 136 11/27/2020 0443   NA 140 03/28/2014 0441   K 3.1 (L) 11/27/2020 0443   K 4.0 03/28/2014 0441   CL 103 11/27/2020 0443   CL 110 (H) 03/28/2014 0441   CO2 26 11/27/2020 0443   CO2 24 03/28/2014 0441   GLUCOSE 118 (H) 11/27/2020 0443   GLUCOSE 86 03/28/2014 0441   BUN 13 11/27/2020 0443   BUN 19 (H) 03/28/2014 0441   CREATININE 0.61 11/27/2020 0443   CREATININE 0.79 11/24/2015 0908   CALCIUM 8.9 11/27/2020 0443   CALCIUM 8.7 03/28/2014 0441   GFRNONAA >60 11/27/2020 0443   GFRNONAA 76 11/24/2015 0908   GFRAA >60 07/28/2017 0507   GFRAA 87 11/24/2015 0908   Assessment/Planning: The patient is a  75 year old female with multiple medical issues who presented with non healing ulceration to the left foot s/p endovascular intervention POD#2   1) Atherosclerotic Disease with Gangrene: s/p endovascular intervention POD#2 Patient with one vessel run off (peroneal). Procedure had to be terminated prematurely as the patient would not lay still after receiving versed and fentanyl. ? past / active drug abuse. Repeat angiogram on Monday with general anesthesia in an attempt to achieve at least two vessel runoff. Patient and family at the bedside are in agreement. Podiatry following for possible debridement / amputation. Tough case as the patient is at high risk for a more proximal amputation due to such heavy disease. Patient is on ASA, Statin and Plavix for medical management Normal creatinine this AM  2) Malnutrition: Will consult nutrition for recommendations in an attempt to maximize healing.   3) Anemia: Hbg 6.8 this AM Receiving transfusion Hospitalist following  Discussed with Dr. Ellis Parents Navjot Pilgrim PA-C 11/27/2020 11:02 AM

## 2020-11-27 NOTE — Progress Notes (Signed)
Initial Nutrition Assessment  DOCUMENTATION CODES:   Severe malnutrition in context of chronic illness  INTERVENTION:   Ensure Enlive po TID, each supplement provides 350 kcal and 20 grams of protein  Magic cup TID with meals, each supplement provides 290 kcal and 9 grams of protein  Ocuvite daily for wound healing (provides zinc, vitamin A, vitamin C, Vitamin E, copper, and selenium)  Vitamin C 272m po BID   Liberalize diet   Pt at high refeed risk; recommend monitor potassium, magnesium and phosphorus labs daily until stable  Recommend oral iron supplementation   NUTRITION DIAGNOSIS:   Severe Malnutrition related to chronic illness (COPD) as evidenced by severe muscle depletion, severe fat depletion.  GOAL:   Patient will meet greater than or equal to 90% of their needs  MONITOR:   PO intake, Supplement acceptance, Labs, Weight trends, Skin, I & O's  REASON FOR ASSESSMENT:   Consult Wound healing  ASSESSMENT:   75y.o. female with medical history significant of HTN, HLD, COPD, stroke on Coumadin, GERD, depression with anxiety, seizure, CAD, sCHF with EF 25-30&, UGIB, anemia, tobacco abuse, HOH and COPD who presents with left foot pain and left foot wound with infection now s/p endovascular intervention 7/27  Met with pt and pt's husband in room today. Pt is HOH so some history provided by husband at bedside. Pt reports poor appetite and oral intake at baseline. Pt does report that she drinks vanilla Ensure at home and takes a daily MVI. Pt reports that she did not eat lunch today as she was in a procedure and when she returned her food was gone. Pt sipping on cola at time of RD visit. RD discussed with pt the importance of adequate nutrition needed to support wound healing. RD also discussed the role of vitamin C in wound healing and educated pt on the effects of smoking on her vitamin C levels. RD will add supplements and vitamins to help pt meet her estimated needs.  Recommend continuing supplements and vitamins until wound healing is complete. RD will also liberalize pt's diet as the heart healthy diet is restrictive of protein. Of note, pt with iron deficiency; recommend supplementation. Pt is likely at refeed risk. Per chart, pt appears weight stable at baseline.   Medications reviewed and include: plavix, lasix, MVI, nicotine, protonix, ceftriaxone, vancomycin   Labs reviewed: K 3.1(L) Wbc- 12.9(H), Hgb 6.8(L), Hct 23.2(L), MCV 66.9(L), MCH 19.6(L), MCHC 29.3(L) Iron 12(L), TIBC 433, ferritin 5(L), folate 41.0, B12 449- 7/27 Cbgs- 109, 114 x 24 hrs AIC 5.3- 7/27  NUTRITION - FOCUSED PHYSICAL EXAM:  Flowsheet Row Most Recent Value  Orbital Region Moderate depletion  Upper Arm Region Severe depletion  Thoracic and Lumbar Region Severe depletion  Buccal Region Mild depletion  Temple Region Mild depletion  Clavicle Bone Region Severe depletion  Clavicle and Acromion Bone Region Severe depletion  Scapular Bone Region Severe depletion  Dorsal Hand Severe depletion  Patellar Region Severe depletion  Anterior Thigh Region Severe depletion  Posterior Calf Region Severe depletion  Edema (RD Assessment) Mild  Hair Reviewed  Eyes Reviewed  Mouth Reviewed  Skin Reviewed  Nails Reviewed   Diet Order:   Diet Order             Diet Heart Room service appropriate? Yes; Fluid consistency: Thin  Diet effective now                  EDUCATION NEEDS:   Education needs have been addressed  Skin:  Skin Assessment: Reviewed RN Assessment (critical limb ischemia with nonhealing wounds to left dorsal foot)  Last BM:  7/26  Height:   Ht Readings from Last 1 Encounters:  11/25/20 5' 5" (1.651 m)    Weight:   Wt Readings from Last 1 Encounters:  11/25/20 59 kg    Ideal Body Weight:  56.8 kg  BMI:  Body mass index is 21.63 kg/m.  Estimated Nutritional Needs:   Kcal:  1600-1800kcal/day  Protein:  80-90g/day  Fluid:   1.4-1.6L/day  Koleen Distance MS, RD, LDN Please refer to [facility] for RD and/or RD on-call/weekend/after hours pager

## 2020-11-27 NOTE — H&P (View-Only) (Signed)
Vein & Vascular Surgery Daily Progress Note   11/25/20:             1.  Ultrasound guidance for vascular access right femoral artery             2.  Catheter placement into left common femoral artery from right femoral approach             3.  Aortogram and selective left lower extremity angiogram             4.  Percutaneous transluminal angioplasty of left peroneal artery and tibioperoneal trunk with 2.5 mm diameter angioplasty balloon             5.  Percutaneous transluminal angioplasty of the left SFA and popliteal arteries with a pair of 4 mm diameter Lutonix drug-coated angioplasty balloon             6.  Viabahn stent placement x2 to the left SFA and popliteal arteries with 6 mm diameter by 15 cm length stent and 6 mm diameter by 25 cm length stent             7.  StarClose closure device right femoral artery  Subjective: Patient sitting up in bed eating. Son and daughter at the bedside. No complaints this AM.   Objective: Vitals:   11/26/20 1526 11/26/20 2028 11/27/20 0439 11/27/20 0750  BP: (!) 109/47 113/67 137/63 108/83  Pulse: 98 (!) 103 (!) 105 (!) 114  Resp: '18 17 16 16  '$ Temp: 98.6 F (37 C) 98.9 F (37.2 C) 98.7 F (37.1 C) 99.8 F (37.7 C)  TempSrc:  Oral  Oral  SpO2: 97% 95% 92% 97%  Weight:      Height:        Intake/Output Summary (Last 24 hours) at 11/27/2020 1102 Last data filed at 11/27/2020 0430 Gross per 24 hour  Intake 14.95 ml  Output 300 ml  Net -285.05 ml   Physical Exam: A&Ox3, NAD CV: RRR Pulmonary: CTA Bilaterally Abdomen: Soft, Non-tender, Non-distended Right Groin:             Access Site: clean, dry and intact Vascular:             Left Lower Extremity: Thigh soft. Calf soft. Extremity warm distally. Unable to palpate pedal pulses. Motor / sensory intact.    Laboratory: CBC    Component Value Date/Time   WBC 12.9 (H) 11/27/2020 0443   HGB 6.8 (L) 11/27/2020 0443   HGB 12.1 03/27/2014 1730   HCT 23.2 (L) 11/27/2020 0443    HCT 37.0 03/27/2014 1730   PLT 268 11/27/2020 0443   PLT 175 03/27/2014 1730   BMET    Component Value Date/Time   NA 136 11/27/2020 0443   NA 140 03/28/2014 0441   K 3.1 (L) 11/27/2020 0443   K 4.0 03/28/2014 0441   CL 103 11/27/2020 0443   CL 110 (H) 03/28/2014 0441   CO2 26 11/27/2020 0443   CO2 24 03/28/2014 0441   GLUCOSE 118 (H) 11/27/2020 0443   GLUCOSE 86 03/28/2014 0441   BUN 13 11/27/2020 0443   BUN 19 (H) 03/28/2014 0441   CREATININE 0.61 11/27/2020 0443   CREATININE 0.79 11/24/2015 0908   CALCIUM 8.9 11/27/2020 0443   CALCIUM 8.7 03/28/2014 0441   GFRNONAA >60 11/27/2020 0443   GFRNONAA 76 11/24/2015 0908   GFRAA >60 07/28/2017 0507   GFRAA 87 11/24/2015 0908   Assessment/Planning: The patient is a  75 year old female with multiple medical issues who presented with non healing ulceration to the left foot s/p endovascular intervention POD#2   1) Atherosclerotic Disease with Gangrene: s/p endovascular intervention POD#2 Patient with one vessel run off (peroneal). Procedure had to be terminated prematurely as the patient would not lay still after receiving versed and fentanyl. ? past / active drug abuse. Repeat angiogram on Monday with general anesthesia in an attempt to achieve at least two vessel runoff. Patient and family at the bedside are in agreement. Podiatry following for possible debridement / amputation. Tough case as the patient is at high risk for a more proximal amputation due to such heavy disease. Patient is on ASA, Statin and Plavix for medical management Normal creatinine this AM  2) Malnutrition: Will consult nutrition for recommendations in an attempt to maximize healing.   3) Anemia: Hbg 6.8 this AM Receiving transfusion Hospitalist following  Discussed with Dr. Ellis Parents Carson Meche PA-C 11/27/2020 11:02 AM

## 2020-11-27 NOTE — Consult Note (Signed)
Pharmacy Antibiotic Note  Michele Matthews is a 75 y.o. female admitted on 11/25/2020 with cellulitis/left foot wound.  Pharmacy has been consulted for vancomycin dosing. Patient is also on ceftriaxone with a wound culture pending. Her renal function has been stable and at apparent baseline since admission.  Plan:  continue vancomycin 1000 mg IV every 24 hours Goal AUC 400-550 Est AUC: 446 Scr used: 0.80 mg/dL (rounded up Follow up cultures, monitor renal function daily  Height: '5\' 5"'$  (165.1 cm) Weight: 59 kg (130 lb) IBW/kg (Calculated) : 57  Temp (24hrs), Avg:99 F (37.2 C), Min:98.6 F (37 C), Max:99.8 F (37.7 C)  Recent Labs  Lab 11/25/20 1023 11/25/20 1024 11/26/20 0517 11/27/20 0443  WBC 8.6  --  10.4 12.9*  CREATININE 0.68  --  0.66 0.61  LATICACIDVEN  --  0.9  --   --      Estimated Creatinine Clearance: 53.8 mL/min (by C-G formula based on SCr of 0.61 mg/dL).    No Known Allergies  Antimicrobials this admission: 7/27 metronidazole >> 7/29 7/27 ceftriaxone >>   7/27 vancomycin >>  Microbiology results: 7/27 BCx: NGTD 7/27 Surgical wound culture: few GPC, GPR, Ab GNR 7/27 SARS CoV-2: negative 7/27 influenza A/B: negative  Thank you for allowing pharmacy to be a part of this patient's care.  Dallie Piles, PharmD, BCPS Clinical Pharmacist   11/27/2020 8:28 AM

## 2020-11-28 DIAGNOSIS — E871 Hypo-osmolality and hyponatremia: Secondary | ICD-10-CM

## 2020-11-28 DIAGNOSIS — E876 Hypokalemia: Secondary | ICD-10-CM

## 2020-11-28 DIAGNOSIS — L089 Local infection of the skin and subcutaneous tissue, unspecified: Secondary | ICD-10-CM | POA: Diagnosis not present

## 2020-11-28 DIAGNOSIS — D500 Iron deficiency anemia secondary to blood loss (chronic): Secondary | ICD-10-CM | POA: Diagnosis not present

## 2020-11-28 DIAGNOSIS — I5042 Chronic combined systolic (congestive) and diastolic (congestive) heart failure: Secondary | ICD-10-CM | POA: Diagnosis not present

## 2020-11-28 LAB — BASIC METABOLIC PANEL
Anion gap: 4 — ABNORMAL LOW (ref 5–15)
BUN: 15 mg/dL (ref 8–23)
CO2: 28 mmol/L (ref 22–32)
Calcium: 9.1 mg/dL (ref 8.9–10.3)
Chloride: 101 mmol/L (ref 98–111)
Creatinine, Ser: 0.63 mg/dL (ref 0.44–1.00)
GFR, Estimated: >60 mL/min (ref >60)
Glucose, Bld: 148 mg/dL — ABNORMAL HIGH (ref 70–99)
Potassium: 3.8 mmol/L (ref 3.5–5.1)
Sodium: 133 mmol/L — ABNORMAL LOW (ref 135–145)

## 2020-11-28 LAB — CBC
HCT: 26.5 % — ABNORMAL LOW (ref 36.0–46.0)
Hemoglobin: 8.3 g/dL — ABNORMAL LOW (ref 12.0–15.0)
MCH: 22.3 pg — ABNORMAL LOW (ref 26.0–34.0)
MCHC: 31.3 g/dL (ref 30.0–36.0)
MCV: 71.2 fL — ABNORMAL LOW (ref 80.0–100.0)
Platelets: 228 10*3/uL (ref 150–400)
RBC: 3.72 MIL/uL — ABNORMAL LOW (ref 3.87–5.11)
RDW: 22.4 % — ABNORMAL HIGH (ref 11.5–15.5)
WBC: 12.1 10*3/uL — ABNORMAL HIGH (ref 4.0–10.5)
nRBC: 0 % (ref 0.0–0.2)

## 2020-11-28 LAB — TYPE AND SCREEN
ABO/RH(D): O NEG
Antibody Screen: NEGATIVE
Unit division: 0

## 2020-11-28 LAB — GLUCOSE, CAPILLARY: Glucose-Capillary: 119 mg/dL — ABNORMAL HIGH (ref 70–99)

## 2020-11-28 LAB — BPAM RBC
Blood Product Expiration Date: 202209012359
ISSUE DATE / TIME: 202207291535
Unit Type and Rh: 5100

## 2020-11-28 MED ORDER — SODIUM CHLORIDE 0.9 % IV SOLN
300.0000 mg | Freq: Once | INTRAVENOUS | Status: AC
  Administered 2020-11-28: 300 mg via INTRAVENOUS
  Filled 2020-11-28: qty 15

## 2020-11-28 NOTE — Progress Notes (Signed)
PROGRESS NOTE    Michele Matthews  [id number] DOB: May 21, 1945 DOA: 11/25/2020 PCP: [place]   Chief complaint.  Wound infection. Brief Narrative:  75 y.o. female with medical history significant of HTN, HLD, COPD, stroke on Coumadin, GERD, depression with anxiety, seizure, CAD, sCHF with EF 25-30&, UGIB, anemia, tobacco abuse, HOH, COPD, who present with left foot pain and left foot wound with infection   Assessment & Plan:   Principal Problem:   Left foot infection Active Problems:   COPD (chronic obstructive pulmonary disease) ([place])   Coronary artery disease   History of CVA with residual deficit   GERD (gastroesophageal reflux disease)   Hyperlipidemia, unspecified   Tobacco abuse   Anxiety and depression   Essential hypertension   Anemia, iron deficiency   Chronic combined systolic and diastolic CHF (congestive heart failure) (H[facility])  Left foot ischemic ulceration Severe atherosclerotic vascular disease with gangrene Blood cultures so far has no growth.  Patient followed by vascular surgery and podiatry.  Patient had vascular intervention with left lower extremity angioplasty and stents on 7/27.  Repeat angiogram scheduled on 8/1. Wound culture with multiple bacteria, final results pending.  Currently covered with vancomycin, cefepime and Flagyl.  #2.  COPD. Stable.  3.  History of CVA. Coronary artery disease  Stable.  4.  Essential hypertension. Continue home medicines  #5.  Iron deficient anemia B12 level normal, will give IV iron.  6.  Chronic and diastolic congestive heart failure  Stable.   DVT prophylaxis: SCDs Code Status: full Family Communication:  Disposition Plan:    Status is: Inpatient  Remains inpatient appropriate because:IV treatments appropriate due to intensity of illness or inability to take PO and Inpatient level of care appropriate due to severity of illness  Dispo: The patient is from: Home              Anticipated d/c  is to: Home              Patient currently is not medically stable to d/c.   Difficult to place patient No        I/O last 3 completed shifts: In: 1194.5 [P.O.:240; I.V.:17; Blood:337.5; IV Piggyback:600] Out: 300 [Urine:300] Total I/O In: 240 [P.O.:240] Out: -      Consultants:  Podiatry and vascular surgery.  Procedures: Lower extremity angiogram and stents  Antimicrobials:  Vancomycin, cefepime and Flagyl  Subjective: Patient doing well today, leg pain under control. No fever or chills No nausea vomiting abdominal pain.  No diarrhea. No short of breath or cough. No dysuria or hematuria.   Objective: Vitals:   11/27/20 2140 11/27/20 2341 11/28/20 0455 11/28/20 0815  BP: (!) 92/49 (!) 141/57 133/80 (!) 156/74  Pulse: 88 93 90 94  Resp: '16 18 18 18  '$ Temp: 98.6 F (37 C)  98.1 F (36.7 C) 98.5 F (36.9 C)  TempSrc: Oral  Oral Oral  SpO2: 95% 91% 90% 91%  Weight:      Height:        Intake/Output Summary (Last 24 hours) at 11/28/2020 1345 Last data filed at 11/28/2020 0900 Gross per 24 hour  Intake 1434.5 ml  Output --  Net 1434.5 ml   Filed Weights   11/25/20 1022 11/25/20 1552  Weight: 63.4 kg 59 kg    Examination:  General exam: Appears calm and comfortable  Respiratory system: Clear to auscultation. Respiratory effort normal. Cardiovascular system: S1 & S2 heard, RRR. No JVD, murmurs, rubs, gallops or clicks.  No pedal edema. Gastrointestinal system: Abdomen is nondistended, soft and nontender. No organomegaly or masses felt. Normal bowel sounds heard. Central nervous system: Alert and oriented x3. No focal neurological deficits. Extremities: Left foot wound Skin: No rashes, lesions or ulcers Psychiatry: Judgement and insight appear normal. Mood & affect appropriate.     Data Reviewed: I have personally reviewed following labs and imaging studies  CBC: Recent Labs  Lab 11/25/20 1023 11/26/20 0517 11/27/20 0443 11/28/20 0432  WBC 8.6  10.4 12.9* 12.1*  NEUTROABS 6.8  --   --   --   HGB 8.3* 7.4* 6.8* 8.3*  HCT 28.6* 25.2* 23.2* 26.5*  MCV 68.1* 67.9* 66.9* 71.2*  PLT 373 310 268 [id number]   Basic Metabolic Panel: Recent Labs  Lab 11/25/20 1023 11/26/20 0517 11/27/20 0443 11/28/20 0432  NA 139 137 136 133*  K 3.9 3.4* 3.1* 3.8  CL 104 105 103 101  CO2 '25 26 26 28  '$ GLUCOSE 101* 126* 118* 148*  BUN '18 22 13 15  '$ CREATININE 0.68 0.66 0.61 0.63  CALCIUM 9.7 9.2 8.9 9.1   GFR: Estimated Creatinine Clearance: 53.8 mL/min (by C-G formula based on SCr of 0.63 mg/dL). Liver Function Tests: Recent Labs  Lab 11/25/20 1023  AST 23  ALT 15  ALKPHOS 74  BILITOT 0.6  PROT 7.1  ALBUMIN 3.6   No results for input(s): LIPASE, AMYLASE in the last 168 hours. No results for input(s): AMMONIA in the last 168 hours. Coagulation Profile: Recent Labs  Lab 11/25/20 1248  INR 1.0   Cardiac Enzymes: No results for input(s): CKTOTAL, CKMB, CKMBINDEX, TROPONINI in the last 168 hours. BNP (last 3 results) No results for input(s): PROBNP in the last 8760 hours. HbA1C: No results for input(s): HGBA1C in the last 72 hours. CBG: Recent Labs  Lab 11/26/20 0744 11/27/20 0749 11/28/20 0816  GLUCAP 109* 114* 119*   Lipid Profile: No results for input(s): CHOL, HDL, LDLCALC, TRIG, CHOLHDL, LDLDIRECT in the last 72 hours. Thyroid Function Tests: No results for input(s): TSH, T4TOTAL, FREET4, T3FREE, THYROIDAB in the last 72 hours. Anemia Panel: No results for input(s): VITAMINB12, FOLATE, FERRITIN, TIBC, IRON, RETICCTPCT in the last 72 hours. Sepsis Labs: Recent Labs  Lab 11/25/20 1024  LATICACIDVEN 0.9    Recent Results (from the past 240 hour(s))  Resp Panel by RT-PCR (Flu A&B, Covid) Nasopharyngeal Swab     Status: None   Collection Time: 11/25/20 10:24 AM   Specimen: Nasopharyngeal Swab; Nasopharyngeal(NP) swabs in vial transport medium  Result Value Ref Range Status   SARS Coronavirus 2 by RT PCR NEGATIVE NEGATIVE  Final    Comment: (NOTE) SARS-CoV-2 target nucleic acids are NOT DETECTED.  The SARS-CoV-2 RNA is generally detectable in upper respiratory specimens during the acute phase of infection. The lowest concentration of SARS-CoV-2 viral copies this assay can detect is 138 copies/mL. A negative result does not preclude SARS-Cov-2 infection and should not be used as the sole basis for treatment or other patient management decisions. A negative result may occur with  improper specimen collection/handling, submission of specimen other than nasopharyngeal swab, presence of viral mutation(s) within the areas targeted by this assay, and inadequate number of viral copies(<138 copies/mL). A negative result must be combined with clinical observations, patient history, and epidemiological information. The expected result is Negative.  Fact Sheet for Patients:  EntrepreneurPulse.com.au  Fact Sheet for Healthcare Providers:  IncredibleEmployment.be  This test is no t yet approved or cleared by the [place]  FDA and  has been authorized for detection and/or diagnosis of SARS-CoV-2 by FDA under an Emergency Use Authorization (EUA). This EUA will remain  in effect (meaning this test can be used) for the duration of the COVID-19 declaration under Section 564(b)(1) of the Act, 21 U.S.C.section 360bbb-3(b)(1), unless the authorization is terminated  or revoked sooner.       Influenza A by PCR NEGATIVE NEGATIVE Final   Influenza B by PCR NEGATIVE NEGATIVE Final    Comment: (NOTE) The Xpert Xpress SARS-CoV-2/FLU/RSV plus assay is intended as an aid in the diagnosis of influenza from Nasopharyngeal swab specimens and should not be used as a sole basis for treatment. Nasal washings and aspirates are unacceptable for Xpert Xpress SARS-CoV-2/FLU/RSV testing.  Fact Sheet for Patients: EntrepreneurPulse.com.au  Fact Sheet for Healthcare  Providers: IncredibleEmployment.be  This test is not yet approved or cleared by the [place] FDA and has been authorized for detection and/or diagnosis of SARS-CoV-2 by FDA under an Emergency Use Authorization (EUA). This EUA will remain in effect (meaning this test can be used) for the duration of the COVID-19 declaration under Section 564(b)(1) of the Act, 21 U.S.C. section 360bbb-3(b)(1), unless the authorization is terminated or revoked.  Performed at [facility], [address] [place], [place] [address]   Aerobic/Anaerobic Culture w Gram Stain (surgical/deep wound)     Status: None (Preliminary result)   Collection Time: 11/25/20 10:24 AM   Specimen: Foot; Wound  Result Value Ref Range Status   Specimen Description   Final    FOOT LEFT Performed at [facility], [place] [address], [place], [place] [address]    Special Requests   Final    NONE Performed at [facility], [place], [place] [address]    Gram Stain   Final    RARE WBC PRESENT, PREDOMINANTLY MONONUCLEAR FEW GRAM POSITIVE COCCI FEW GRAM POSITIVE RODS GRAM NEGATIVE RODS Performed at [facility], [place] [address] [place], [place] [address]    Culture   Final    Emmit [place] NEGATIVE RODS ABUNDANT DIPHTHEROIDS(CORYNEBACTERIUM SPECIES) Standardized susceptibility testing for this organism is not available. NO ANAEROBES ISOLATED; CULTURE IN PROGRESS FOR 5 DAYS    Report Status PENDING  Incomplete  CULTURE, BLOOD (ROUTINE X 2) w Reflex to ID Panel     Status: None (Preliminary result)   Collection Time: 11/25/20 12:48 PM   Specimen: BLOOD  Result Value Ref Range Status   Specimen Description BLOOD LEFT ANTECUBITAL  Final   Special Requests   Final    BOTTLES DRAWN AEROBIC AND ANAEROBIC Blood Culture adequate volume   Culture   Final    NO GROWTH 2 DAYS Performed at [facility], [address] [place], [place] [address]     Report Status PENDING  Incomplete  CULTURE, BLOOD (ROUTINE X 2) w Reflex to ID Panel     Status: None (Preliminary result)   Collection Time: 11/25/20 12:55 PM   Specimen: BLOOD  Result Value Ref Range Status   Specimen Description BLOOD BLOOD LEFT HAND  Final   Special Requests   Final    BOTTLES DRAWN AEROBIC AND ANAEROBIC Blood Culture results may not be optimal due to an inadequate volume of blood received in culture bottles   Culture   Final    NO GROWTH 2 DAYS Performed at [facility], [address], [place], [place] [address]    Report Status PENDING  Incomplete  Radiology Studies: No results found.      Scheduled Meds:  vitamin C  250 mg Oral BID   atorvastatin  10 mg Oral Daily   busPIRone  7.5 mg Oral BID   carvedilol  3.125 mg Oral BID WC   clopidogrel  75 mg Oral Daily   feeding supplement  237 mL Oral TID BM   ferrous sulfate  325 mg Oral Q breakfast   furosemide  20 mg Oral Daily   lisinopril  5 mg Oral Daily   multivitamin with minerals  1 tablet Oral Daily   multivitamin-lutein  1 capsule Oral Daily   nicotine  21 mg Transdermal Daily   pantoprazole  40 mg Oral Daily   sertraline  100 mg Oral BID   Continuous Infusions:  sodium chloride Stopped (11/26/20 1306)   cefTRIAXone (ROCEPHIN)  IV 1 g (11/28/20 1300)   iron sucrose     vancomycin Stopped (11/28/20 0503)     LOS: 3 days    Time spent: 28 minutes    Sharen Hones, MD [facility]   To contact the attending provider between 7A-7P or the covering provider during after hours 7P-7A, please log into the web site [email or web address] and access using universal [place] password for that web site. If you do not have the password, please call the hospital operator.  11/28/2020, 1:45 PM

## 2020-11-29 ENCOUNTER — Inpatient Hospital Stay: Payer: Medicare HMO

## 2020-11-29 DIAGNOSIS — D500 Iron deficiency anemia secondary to blood loss (chronic): Secondary | ICD-10-CM | POA: Diagnosis not present

## 2020-11-29 DIAGNOSIS — I5042 Chronic combined systolic (congestive) and diastolic (congestive) heart failure: Secondary | ICD-10-CM | POA: Diagnosis not present

## 2020-11-29 DIAGNOSIS — L089 Local infection of the skin and subcutaneous tissue, unspecified: Secondary | ICD-10-CM | POA: Diagnosis not present

## 2020-11-29 LAB — GLUCOSE, CAPILLARY: Glucose-Capillary: 110 mg/dL — ABNORMAL HIGH (ref 70–99)

## 2020-11-29 MED ORDER — SODIUM CHLORIDE 0.9 % IV SOLN
2.0000 g | Freq: Two times a day (BID) | INTRAVENOUS | Status: DC
  Administered 2020-11-29: 2 g via INTRAVENOUS
  Filled 2020-11-29 (×3): qty 2

## 2020-11-29 MED ORDER — PIPERACILLIN-TAZOBACTAM 3.375 G IVPB
3.3750 g | Freq: Three times a day (TID) | INTRAVENOUS | Status: DC
  Administered 2020-11-29 – 2020-12-03 (×11): 3.375 g via INTRAVENOUS
  Filled 2020-11-29 (×9): qty 50

## 2020-11-29 NOTE — Progress Notes (Addendum)
PROGRESS NOTE    Michele Matthews  [id number] DOB: [birth year] DOA: 11/25/2020 PCP: [place]    Brief Narrative:   75 y.o. female with medical history significant of HTN, HLD, COPD, stroke on Coumadin, GERD, depression with anxiety, seizure, CAD, sCHF with EF 25-30&, UGIB, anemia, tobacco abuse, HOH, COPD, who present with left foot pain and left foot wound with infection  Assessment & Plan:   Principal Problem:   Left foot infection Active Problems:   COPD (chronic obstructive pulmonary disease) ([place])   Coronary artery disease   History of CVA with residual deficit   GERD (gastroesophageal reflux disease)   Hyperlipidemia, unspecified   Tobacco abuse   Anxiety and depression   Essential hypertension   Anemia, iron deficiency   Severe protein-calorie malnutrition (H[facility])   Chronic combined systolic and diastolic CHF (congestive heart failure) (H[facility])   Hypokalemia   Hyponatremia  Left foot ischemic ulceration Severe atherosclerotic vascular disease with gangrene Blood culture no growth.  Wound culture has grown out Pseudomonas, gram-positive cocci still pending identification.  Continue vancomycin and cefepime. Is a pending for repeat angiogram tomorrow.  #2.  COPD. History of CVA. Coronary artery disease. Essential hypertension. Stable.  #3.  Iron deficient anemia. Received IV iron yesterday.  B12 normal.  4.  Chronic diastolic congestive heart failure. Stable.  #5. low-grade fever. Patient had low-grade fever last night, she was also diaphoretic.  She appeared to have some dysphagia with eating.  Possibility of aspiration pneumonitis.  Obtain chest x-ray, obtain speech therapy evaluation. Current antibiotic coverage should be sufficient.   DVT prophylaxis: SCDs Code Status: full Family Communication:  Disposition Plan:    Status is: Inpatient  Remains inpatient appropriate because:Ongoing diagnostic testing needed not appropriate for outpatient  work up and Inpatient level of care appropriate due to severity of illness  Dispo: The patient is from: Home              Anticipated d/c is to: Home              Patient currently is not medically stable to d/c.   Difficult to place patient No        I/O last 3 completed shifts: In: 1246.3 [P.O.:720; IV Piggyback:526.3] Out: 1200 [Urine:1200] Total I/O In: 360 [P.O.:360] Out: -      Consultants:  vascular  Procedures:   Antimicrobials: Vancomycin and cefepime  Subjective: Patient doing well today, foot pain under control. She had an episode of, chills and diaphoresis.  Temperature was 100.  She also has a cough, nonproductive.  She does not feel short of breath.  She appeared to have some choking when she was eating this breakfast this morning. No abdominal pain or nausea vomiting. No dysuria hematuria.  Objective: Vitals:   11/29/20 0039 11/29/20 0348 11/29/20 0401 11/29/20 0802  BP:  (!) 161/70  (!) 156/62  Pulse:  95  99  Resp:  20  18  Temp: 98.4 F (36.9 C) (!) 97.3 F (36.3 C)  98.1 F (36.7 C)  TempSrc: Oral Oral  Oral  SpO2:  91%  (!) 87%  Weight:   60.5 kg   Height:        Intake/Output Summary (Last 24 hours) at 11/29/2020 1023 Last data filed at 11/29/2020 0900 Gross per 24 hour  Intake 926.3 ml  Output 1200 ml  Net -273.7 ml   Filed Weights   11/25/20 1022 11/25/20 1552 11/29/20 0401  Weight: 63.4 kg 59 kg 60.5  kg    Examination:  General exam: Appears calm and comfortable  Respiratory system: Clear to auscultation. Respiratory effort normal. Cardiovascular system: S1 & S2 heard, RRR. No JVD, murmurs, rubs, gallops or clicks. No pedal edema. Gastrointestinal system: Abdomen is nondistended, soft and nontender. No organomegaly or masses felt. Normal bowel sounds heard. Central nervous system: Alert and oriented x3. No focal neurological deficits. Extremities: Symmetric 5 x 5 power. Skin: No rashes, lesions or ulcers Psychiatry:  Mood &  affect appropriate.     Data Reviewed: I have personally reviewed following labs and imaging studies  CBC: Recent Labs  Lab 11/25/20 1023 11/26/20 0517 11/27/20 0443 11/28/20 0432  WBC 8.6 10.4 12.9* 12.1*  NEUTROABS 6.8  --   --   --   HGB 8.3* 7.4* 6.8* 8.3*  HCT 28.6* 25.2* 23.2* 26.5*  MCV 68.1* 67.9* 66.9* 71.2*  PLT 373 310 268 [id number]   Basic Metabolic Panel: Recent Labs  Lab 11/25/20 1023 11/26/20 0517 11/27/20 0443 11/28/20 0432  NA 139 137 136 133*  K 3.9 3.4* 3.1* 3.8  CL 104 105 103 101  CO2 '25 26 26 28  '$ GLUCOSE 101* 126* 118* 148*  BUN '18 22 13 15  '$ CREATININE 0.68 0.66 0.61 0.63  CALCIUM 9.7 9.2 8.9 9.1   GFR: Estimated Creatinine Clearance: 53.8 mL/min (by C-G formula based on SCr of 0.63 mg/dL). Liver Function Tests: Recent Labs  Lab 11/25/20 1023  AST 23  ALT 15  ALKPHOS 74  BILITOT 0.6  PROT 7.1  ALBUMIN 3.6   No results for input(s): LIPASE, AMYLASE in the last 168 hours. No results for input(s): AMMONIA in the last 168 hours. Coagulation Profile: Recent Labs  Lab 11/25/20 1248  INR 1.0   Cardiac Enzymes: No results for input(s): CKTOTAL, CKMB, CKMBINDEX, TROPONINI in the last 168 hours. BNP (last 3 results) No results for input(s): PROBNP in the last 8760 hours. HbA1C: No results for input(s): HGBA1C in the last 72 hours. CBG: Recent Labs  Lab 11/26/20 0744 11/27/20 0749 11/28/20 0816 11/29/20 0803  GLUCAP 109* 114* 119* 110*   Lipid Profile: No results for input(s): CHOL, HDL, LDLCALC, TRIG, CHOLHDL, LDLDIRECT in the last 72 hours. Thyroid Function Tests: No results for input(s): TSH, T4TOTAL, FREET4, T3FREE, THYROIDAB in the last 72 hours. Anemia Panel: No results for input(s): VITAMINB12, FOLATE, FERRITIN, TIBC, IRON, RETICCTPCT in the last 72 hours. Sepsis Labs: Recent Labs  Lab 11/25/20 1024  LATICACIDVEN 0.9    Recent Results (from the past 240 hour(s))  Resp Panel by RT-PCR (Flu A&B, Covid) Nasopharyngeal Swab      Status: None   Collection Time: 11/25/20 10:24 AM   Specimen: Nasopharyngeal Swab; Nasopharyngeal(NP) swabs in vial transport medium  Result Value Ref Range Status   SARS Coronavirus 2 by RT PCR NEGATIVE NEGATIVE Final    Comment: (NOTE) SARS-CoV-2 target nucleic acids are NOT DETECTED.  The SARS-CoV-2 RNA is generally detectable in upper respiratory specimens during the acute phase of infection. The lowest concentration of SARS-CoV-2 viral copies this assay can detect is 138 copies/mL. A negative result does not preclude SARS-Cov-2 infection and should not be used as the sole basis for treatment or other patient management decisions. A negative result may occur with  improper specimen collection/handling, submission of specimen other than nasopharyngeal swab, presence of viral mutation(s) within the areas targeted by this assay, and inadequate number of viral copies(<138 copies/mL). A negative result must be combined with clinical observations, patient history, and  epidemiological information. The expected result is Negative.  Fact Sheet for Patients:  EntrepreneurPulse.com.au  Fact Sheet for Healthcare Providers:  IncredibleEmployment.be  This test is no t yet approved or cleared by the [place] FDA and  has been authorized for detection and/or diagnosis of SARS-CoV-2 by FDA under an Emergency Use Authorization (EUA). This EUA will remain  in effect (meaning this test can be used) for the duration of the COVID-19 declaration under Section 564(b)(1) of the Act, 21 U.S.C.section 360bbb-3(b)(1), unless the authorization is terminated  or revoked sooner.       Influenza A by PCR NEGATIVE NEGATIVE Final   Influenza B by PCR NEGATIVE NEGATIVE Final    Comment: (NOTE) The Xpert Xpress SARS-CoV-2/FLU/RSV plus assay is intended as an aid in the diagnosis of influenza from Nasopharyngeal swab specimens and should not be used as a sole  basis for treatment. Nasal washings and aspirates are unacceptable for Xpert Xpress SARS-CoV-2/FLU/RSV testing.  Fact Sheet for Patients: EntrepreneurPulse.com.au  Fact Sheet for Healthcare Providers: IncredibleEmployment.be  This test is not yet approved or cleared by the [place] FDA and has been authorized for detection and/or diagnosis of SARS-CoV-2 by FDA under an Emergency Use Authorization (EUA). This EUA will remain in effect (meaning this test can be used) for the duration of the COVID-19 declaration under Section 564(b)(1) of the Act, 21 U.S.C. section 360bbb-3(b)(1), unless the authorization is terminated or revoked.  Performed at [facility], [address] [place], [place] [address]   Aerobic/Anaerobic Culture w Gram Stain (surgical/deep wound)     Status: None (Preliminary result)   Collection Time: 11/25/20 10:24 AM   Specimen: Foot; Wound  Result Value Ref Range Status   Specimen Description   Final    FOOT LEFT Performed at [facility], [place] [address], [place], [place] [address]    Special Requests   Final    NONE Performed at [facility], [place], [place] [address]    Gram Stain   Final    RARE WBC PRESENT, PREDOMINANTLY MONONUCLEAR FEW GRAM POSITIVE COCCI FEW GRAM POSITIVE RODS GRAM NEGATIVE RODS Performed at [facility], [place] [address], [place], [place] [address]    Culture   Final    ABUNDANT PSEUDOMONAS AERUGINOSA ABUNDANT DIPHTHEROIDS(CORYNEBACTERIUM SPECIES) Standardized susceptibility testing for this organism is not available. NO ANAEROBES ISOLATED; CULTURE IN PROGRESS FOR 5 DAYS    Report Status PENDING  Incomplete   Organism ID, Bacteria PSEUDOMONAS AERUGINOSA  Final      Susceptibility   Pseudomonas aeruginosa - MIC*    CEFTAZIDIME 4 SENSITIVE Sensitive     CIPROFLOXACIN <=0.25 SENSITIVE Sensitive     GENTAMICIN <=1 SENSITIVE Sensitive      IMIPENEM 1 SENSITIVE Sensitive     PIP/TAZO 8 SENSITIVE Sensitive     CEFEPIME 2 SENSITIVE Sensitive     * ABUNDANT PSEUDOMONAS AERUGINOSA  CULTURE, BLOOD (ROUTINE X 2) w Reflex to ID Panel     Status: None (Preliminary result)   Collection Time: 11/25/20 12:48 PM   Specimen: BLOOD  Result Value Ref Range Status   Specimen Description BLOOD LEFT ANTECUBITAL  Final   Special Requests   Final    BOTTLES DRAWN AEROBIC AND ANAEROBIC Blood Culture adequate volume   Culture   Final    NO GROWTH 2 DAYS Performed at [facility], [address] [place], [place] [address]    Report Status PENDING  Incomplete  CULTURE,  BLOOD (ROUTINE X 2) w Reflex to ID Panel     Status: None (Preliminary result)   Collection Time: 11/25/20 12:55 PM   Specimen: BLOOD  Result Value Ref Range Status   Specimen Description BLOOD BLOOD LEFT HAND  Final   Special Requests   Final    BOTTLES DRAWN AEROBIC AND ANAEROBIC Blood Culture results may not be optimal due to an inadequate volume of blood received in culture bottles   Culture   Final    NO GROWTH 2 DAYS Performed at [facility], [address] [place], [place] [address]    Report Status PENDING  Incomplete         Radiology Studies: No results found.      Scheduled Meds:  vitamin C  250 mg Oral BID   atorvastatin  10 mg Oral Daily   busPIRone  7.5 mg Oral BID   carvedilol  3.125 mg Oral BID WC   clopidogrel  75 mg Oral Daily   feeding supplement  237 mL Oral TID BM   ferrous sulfate  325 mg Oral Q breakfast   furosemide  20 mg Oral Daily   lisinopril  5 mg Oral Daily   multivitamin with minerals  1 tablet Oral Daily   multivitamin-lutein  1 capsule Oral Daily   nicotine  21 mg Transdermal Daily   pantoprazole  40 mg Oral Daily   sertraline  100 mg Oral BID   Continuous Infusions:  sodium chloride Stopped (11/26/20 1306)   ceFEPime (MAXIPIME) IV     vancomycin 1,000 mg (11/29/20 0448)     LOS: 4 days     Time spent: 27 minutes    Sharen Hones, MD [facility]   To contact the attending provider between 7A-7P or the covering provider during after hours 7P-7A, please log into the web site [email or web address] and access using universal [place] password for that web site. If you do not have the password, please call the hospital operator.  11/29/2020, 10:23 AM

## 2020-11-29 NOTE — Consult Note (Signed)
Pharmacy Antibiotic Note  Michele Matthews is a 75 y.o. female admitted on 11/25/2020 with cellulitis/left foot wound.  Pharmacy has been consulted for vancomycin dosing. Patient is also on ceftriaxone with a wound culture pending. Her renal function has been stable and at apparent baseline since admission.  Pharmacy now being consulted to transition pt from Ceftriaxone to Cefepime for Pseudomonas Coverage.  Plan:  continue vancomycin 1000 mg IV every 24 hours Goal AUC 400-550 Est AUC: 446 Scr used: 0.80 mg/dL (rounded up Follow up cultures, monitor renal function daily  Ordered Cefepime 2 gm q12h per indication and current renal fxn.  Height: '5\' 5"'$  (165.1 cm) Weight: 60.5 kg (133 lb 6.1 oz) IBW/kg (Calculated) : 57  Temp (24hrs), Avg:98.5 F (36.9 C), Min:97.3 F (36.3 C), Max:100 F (37.8 C)  Recent Labs  Lab 11/25/20 1023 11/25/20 1024 11/26/20 0517 11/27/20 0443 11/28/20 0432  WBC 8.6  --  10.4 12.9* 12.1*  CREATININE 0.68  --  0.66 0.61 0.63  LATICACIDVEN  --  0.9  --   --   --      Estimated Creatinine Clearance: 53.8 mL/min (by C-G formula based on SCr of 0.63 mg/dL).    No Known Allergies  Antimicrobials this admission: 7/27 metronidazole >> 7/29 7/27 ceftriaxone >> 7/30  7/27 vancomycin >> 7/30 Cefepime >>   Microbiology results: 7/27 BCx: NGTD 7/27 Surgical wound culture: few GPC, GPR, Ab GNR - 7/30 ABUNDANT PSEUDOMONAS AERUGINOSA & ABUNDANT DIPHTHEROIDS (CORYNEBACTERIUM SPECIES)    Thank you for allowing pharmacy to be a part of this patient's care.  Renda Rolls, PharmD, [facility] 11/29/2020 4:58 AM

## 2020-11-29 NOTE — Progress Notes (Signed)
Patient with nonhealing ulcer Left foot. Atherosclerotic Disease with Gangrene: s/p endovascular intervention POD#4 Patient with one vessel run off (peroneal).  Patient unable to remain still and cooperative with attempted moderate sedation.  Plan for repeat intervention tomorrow under general anesthesia to attempt to improve runoff. NPO after MN Obtain Consent.

## 2020-11-30 ENCOUNTER — Inpatient Hospital Stay: Payer: Medicare HMO | Admitting: Certified Registered Nurse Anesthetist

## 2020-11-30 ENCOUNTER — Encounter
Admission: EM | Disposition: A | Discharge status: Skilled Nursing Facility | Payer: Self-pay | Source: Home / Self Care | Attending: Family Medicine

## 2020-11-30 DIAGNOSIS — L089 Local infection of the skin and subcutaneous tissue, unspecified: Secondary | ICD-10-CM | POA: Diagnosis not present

## 2020-11-30 DIAGNOSIS — I7777 Dissection of artery of lower extremity: Secondary | ICD-10-CM

## 2020-11-30 DIAGNOSIS — D500 Iron deficiency anemia secondary to blood loss (chronic): Secondary | ICD-10-CM | POA: Diagnosis not present

## 2020-11-30 DIAGNOSIS — I5042 Chronic combined systolic (congestive) and diastolic (congestive) heart failure: Secondary | ICD-10-CM | POA: Diagnosis not present

## 2020-11-30 DIAGNOSIS — I70262 Atherosclerosis of native arteries of extremities with gangrene, left leg: Secondary | ICD-10-CM

## 2020-11-30 HISTORY — PX: LOWER EXTREMITY ANGIOGRAPHY: CATH118251

## 2020-11-30 LAB — CULTURE, BLOOD (ROUTINE X 2)
Culture: NO GROWTH
Culture: NO GROWTH
Special Requests: ADEQUATE

## 2020-11-30 LAB — AEROBIC/ANAEROBIC CULTURE W GRAM STAIN (SURGICAL/DEEP WOUND)

## 2020-11-30 LAB — GLUCOSE, CAPILLARY: Glucose-Capillary: 115 mg/dL — ABNORMAL HIGH (ref 70–99)

## 2020-11-30 SURGERY — LOWER EXTREMITY ANGIOGRAPHY
Anesthesia: General | Laterality: Left

## 2020-11-30 MED ORDER — CEFAZOLIN SODIUM-DEXTROSE 2-4 GM/100ML-% IV SOLN
2.0000 g | Freq: Once | INTRAVENOUS | Status: AC
  Administered 2020-11-30: 2 g via INTRAVENOUS
  Filled 2020-11-30: qty 100

## 2020-11-30 MED ORDER — IODIXANOL 320 MG/ML IV SOLN
INTRAVENOUS | Status: DC | PRN
  Administered 2020-11-30: 40 mL

## 2020-11-30 MED ORDER — DEXAMETHASONE SODIUM PHOSPHATE 10 MG/ML IJ SOLN
INTRAMUSCULAR | Status: DC | PRN
  Administered 2020-11-30: 5 mg via INTRAVENOUS

## 2020-11-30 MED ORDER — GLYCOPYRROLATE 0.2 MG/ML IJ SOLN
INTRAMUSCULAR | Status: DC | PRN
  Administered 2020-11-30: .2 mg via INTRAVENOUS

## 2020-11-30 MED ORDER — PROPOFOL 10 MG/ML IV BOLUS
INTRAVENOUS | Status: DC | PRN
  Administered 2020-11-30 (×3): 20 mg via INTRAVENOUS
  Administered 2020-11-30 (×2): 10 mg via INTRAVENOUS

## 2020-11-30 MED ORDER — ONDANSETRON HCL 4 MG/2ML IJ SOLN
INTRAMUSCULAR | Status: DC | PRN
Start: 2020-11-30 — End: 2020-11-30
  Administered 2020-11-30: 4 mg via INTRAVENOUS

## 2020-11-30 MED ORDER — PROPOFOL 500 MG/50ML IV EMUL
INTRAVENOUS | Status: DC | PRN
  Administered 2020-11-30: 50 ug/kg/min via INTRAVENOUS

## 2020-11-30 MED ORDER — KETAMINE HCL 50 MG/ML IJ SOLN
INTRAMUSCULAR | Status: AC
  Filled 2020-11-30: qty 1

## 2020-11-30 MED ORDER — KETAMINE HCL 50 MG/ML IJ SOLN
INTRAMUSCULAR | Status: DC | PRN
  Administered 2020-11-30 (×3): 5 mg via INTRAMUSCULAR
  Administered 2020-11-30: 10 mg via INTRAMUSCULAR

## 2020-11-30 MED ORDER — SODIUM CHLORIDE 0.9 % IV SOLN
INTRAVENOUS | Status: DC | PRN
  Administered 2020-11-30: 25 ug/min via INTRAVENOUS

## 2020-11-30 MED ORDER — HEPARIN SODIUM (PORCINE) 1000 UNIT/ML IJ SOLN
INTRAMUSCULAR | Status: DC | PRN
  Administered 2020-11-30: 4000 [IU] via INTRAVENOUS

## 2020-11-30 MED ORDER — PROPOFOL 500 MG/50ML IV EMUL
INTRAVENOUS | Status: AC
  Filled 2020-11-30: qty 50

## 2020-11-30 SURGICAL SUPPLY — 22 items
BALLN LUTONIX  018 4X60X130 (BALLOONS) ×1
BALLN LUTONIX 018 4X60X130 (BALLOONS) ×1
BALLN LUTONIX 018 5X60X130 (BALLOONS) ×2
BALLN UTLRAVERSE 2X60X150 (BALLOONS) ×2
BALLOON LUTONIX 018 4X60X130 (BALLOONS) IMPLANT
BALLOON LUTONIX 018 5X60X130 (BALLOONS) IMPLANT
BALLOON UTLRAVERSE 2X60X150 (BALLOONS) IMPLANT
CATH ANGIO 5F PIGTAIL 65CM (CATHETERS) ×1 IMPLANT
CATH BEACON 5 .035 65 RIM TIP (CATHETERS) ×1 IMPLANT
CATH VERT 5X100 (CATHETERS) ×1 IMPLANT
COVER PROBE U/S 5X48 (MISCELLANEOUS) ×1 IMPLANT
DEVICE STARCLOSE SE CLOSURE (Vascular Products) ×1 IMPLANT
GLIDEWIRE ADV .035X260CM (WIRE) ×1 IMPLANT
KIT ENCORE 26 ADVANTAGE (KITS) ×1 IMPLANT
PACK ANGIOGRAPHY (CUSTOM PROCEDURE TRAY) ×2 IMPLANT
SHEATH ANL2 6FRX45 HC (SHEATH) ×1 IMPLANT
SHEATH BRITE TIP 5FRX11 (SHEATH) ×1 IMPLANT
STENT VIABAHN 5X50X120 (Permanent Stent) ×1 IMPLANT
SYR MEDRAD MARK 7 150ML (SYRINGE) ×1 IMPLANT
TUBING CONTRAST HIGH PRESS 72 (TUBING) ×1 IMPLANT
WIRE G V18X300CM (WIRE) ×1 IMPLANT
WIRE GUIDERIGHT .035X150 (WIRE) ×2 IMPLANT

## 2020-11-30 NOTE — Progress Notes (Signed)
SLP Cancellation Note  Patient Details Name: Michele Matthews MRN: [id number] DOB: December 18, 1945   Cancelled treatment:       Reason Eval/Treat Not Completed: Patient at procedure or test/unavailable (chart reviewed). Per chart notes, pt has been NPO since midnight for procedure today. Pt is currently out of room for the procedure. ST services will f/u tomorrow for BSE. Recommend frequent oral care d/t recent NPO status; general aspiration precautions.      Orinda Kenner, MS, [place] [facility] Language Pathologist Re[facility] [phone] [facility] 11/30/2020, 2:05 PM

## 2020-11-30 NOTE — Progress Notes (Signed)
PROGRESS NOTE    Michele Matthews  [id number] DOB: [birth year] DOA: 11/25/2020 PCP: [facility]    Brief Narrative:  75 y.o. female with medical history significant of HTN, HLD, COPD, stroke on Coumadin, GERD, depression with anxiety, seizure, CAD, sCHF with EF 25-30&, UGIB, anemia, tobacco abuse, HOH, COPD, who present with left foot pain and left foot wound with infection. Patient had vascular intervention with left lower extremity angioplasty and stents on 7/27.  Repeat angiogram scheduled on 8/1. 7/31.  Wound culture grow Pseudomonas and Corynebacterium, antibiotics was produced Zosyn.   Assessment & Plan:   Principal Problem:   Left foot infection Active Problems:   COPD (chronic obstructive pulmonary disease) (HCC)   Coronary artery disease   History of CVA with residual deficit   GERD (gastroesophageal reflux disease)   Hyperlipidemia, unspecified   Tobacco abuse   Anxiety and depression   Essential hypertension   Anemia, iron deficiency   Severe protein-calorie malnutrition (HCC)   Chronic combined systolic and diastolic CHF (congestive heart failure) (HCC)   Hypokalemia   Hyponatremia  Left foot ischemic ulceration with infection. Severe atherosclerotic vascular disease with gangrene Wound culture positive for Pseudomonas and a Khary bacterium.  Blood culture negative. Antibiotics were switched to Zosyn.  Patient in OR for repeat angiogram today.  2.  Iron deficient anemia. Received IV iron, recheck CBC tomorrow.  3.  Low grade fever. Patient also has increased cough.  Chest x-ray did not show pneumonia.  Patient might aspiration event, pending speech therapy evaluation.  #4.  Chronic diastolic congestive heart failure. COPD P History of CVA. Coronary artery disease. Essential hypertension. Conditions are stable.   DVT prophylaxis: SCDs Code Status: full Family Communication:  Disposition Plan:    Status is: Inpatient  Remains inpatient  appropriate because:IV treatments appropriate due to intensity of illness or inability to take PO and Inpatient level of care appropriate due to severity of illness  Dispo: The patient is from: Home              Anticipated d/c is to: Home              Patient currently is not medically stable to d/c.   Difficult to place patient No        I/O last 3 completed shifts: In: 684.9 [P.O.:600; IV Piggyback:84.9] Out: 2550 [Urine:2550] No intake/output data recorded.     Consultants:  Vascular,   Procedures:   Antimicrobials: Zosyn Subjective: Patient slept well last night, no additional fever. Denies any short of breath or cough. No abdominal pain or nausea vomiting. No dysuria hematuria.  Objective: Vitals:   11/30/20 0400 11/30/20 0407 11/30/20 0746 11/30/20 1219  BP: (!) 142/76  137/77 (!) 95/59  Pulse: 85  84 82  Resp: '18  16 17  '$ Temp: 98.4 F (36.9 C)  98 F (36.7 C) 98.2 F (36.8 C)  TempSrc: Oral  Oral Oral  SpO2: 95%  98% (!) 89%  Weight:  60.6 kg    Height:        Intake/Output Summary (Last 24 hours) at 11/30/2020 1310 Last data filed at 11/30/2020 0406 Gross per 24 hour  Intake 290 ml  Output 1200 ml  Net -910 ml   Filed Weights   11/25/20 1552 11/29/20 0401 11/30/20 0407  Weight: 59 kg 60.5 kg 60.6 kg    Examination:  General exam: Appears calm and comfortable  Respiratory system: Clear to auscultation. Respiratory effort normal. Cardiovascular system: S1 &  S2 heard, RRR. No JVD, murmurs, rubs, gallops or clicks. No pedal edema. Gastrointestinal system: Abdomen is nondistended, soft and nontender. No organomegaly or masses felt. Normal bowel sounds heard. Central nervous system: Alert and oriented. No focal neurological deficits. Extremities: Symmetric 5 x 5 power. Skin: No rashes, lesions or ulcers Psychiatry: Judgement and insight appear normal. Mood & affect appropriate.     Data Reviewed: I have personally reviewed following labs and  imaging studies  CBC: Recent Labs  Lab 11/25/20 1023 11/26/20 0517 11/27/20 0443 11/28/20 0432  WBC 8.6 10.4 12.9* 12.1*  NEUTROABS 6.8  --   --   --   HGB 8.3* 7.4* 6.8* 8.3*  HCT 28.6* 25.2* 23.2* 26.5*  MCV 68.1* 67.9* 66.9* 71.2*  PLT 373 310 268 [id number]   Basic Metabolic Panel: Recent Labs  Lab 11/25/20 1023 11/26/20 0517 11/27/20 0443 11/28/20 0432  NA 139 137 136 133*  K 3.9 3.4* 3.1* 3.8  CL 104 105 103 101  CO2 '25 26 26 28  '$ GLUCOSE 101* 126* 118* 148*  BUN '18 22 13 15  '$ CREATININE 0.68 0.66 0.61 0.63  CALCIUM 9.7 9.2 8.9 9.1   GFR: Estimated Creatinine Clearance: 53.8 mL/min (by C-G formula based on SCr of 0.63 mg/dL). Liver Function Tests: Recent Labs  Lab 11/25/20 1023  AST 23  ALT 15  ALKPHOS 74  BILITOT 0.6  PROT 7.1  ALBUMIN 3.6   No results for input(s): LIPASE, AMYLASE in the last 168 hours. No results for input(s): AMMONIA in the last 168 hours. Coagulation Profile: Recent Labs  Lab 11/25/20 1248  INR 1.0   Cardiac Enzymes: No results for input(s): CKTOTAL, CKMB, CKMBINDEX, TROPONINI in the last 168 hours. BNP (last 3 results) No results for input(s): PROBNP in the last 8760 hours. HbA1C: No results for input(s): HGBA1C in the last 72 hours. CBG: Recent Labs  Lab 11/26/20 0744 11/27/20 0749 11/28/20 0816 11/29/20 0803 11/30/20 0747  GLUCAP 109* 114* 119* 110* 115*   Lipid Profile: No results for input(s): CHOL, HDL, LDLCALC, TRIG, CHOLHDL, LDLDIRECT in the last 72 hours. Thyroid Function Tests: No results for input(s): TSH, T4TOTAL, FREET4, T3FREE, THYROIDAB in the last 72 hours. Anemia Panel: No results for input(s): VITAMINB12, FOLATE, FERRITIN, TIBC, IRON, RETICCTPCT in the last 72 hours. Sepsis Labs: Recent Labs  Lab 11/25/20 1024  LATICACIDVEN 0.9    Recent Results (from the past 240 hour(s))  Resp Panel by RT-PCR (Flu A&B, Covid) Nasopharyngeal Swab     Status: None   Collection Time: 11/25/20 10:24 AM   Specimen:  Nasopharyngeal Swab; Nasopharyngeal(NP) swabs in vial transport medium  Result Value Ref Range Status   SARS Coronavirus 2 by RT PCR NEGATIVE NEGATIVE Final    Comment: (NOTE) SARS-CoV-2 target nucleic acids are NOT DETECTED.  The SARS-CoV-2 RNA is generally detectable in upper respiratory specimens during the acute phase of infection. The lowest concentration of SARS-CoV-2 viral copies this assay can detect is 138 copies/mL. A negative result does not preclude SARS-Cov-2 infection and should not be used as the sole basis for treatment or other patient management decisions. A negative result may occur with  improper specimen collection/handling, submission of specimen other than nasopharyngeal swab, presence of viral mutation(s) within the areas targeted by this assay, and inadequate number of viral copies(<138 copies/mL). A negative result must be combined with clinical observations, patient history, and epidemiological information. The expected result is Negative.  Fact Sheet for Patients:  EntrepreneurPulse.com.au  Fact Sheet for Healthcare  Providers:  IncredibleEmployment.be  This test is no t yet approved or cleared by the [place] and  has been authorized for detection and/or diagnosis of SARS-CoV-2 by FDA under an Emergency Use Authorization (EUA). This EUA will remain  in effect (meaning this test can be used) for the duration of the COVID-19 declaration under Section 564(b)(1) of the Act, 21 U.S.C.section 360bbb-3(b)(1), unless the authorization is terminated  or revoked sooner.       Influenza A by PCR NEGATIVE NEGATIVE Final   Influenza B by PCR NEGATIVE NEGATIVE Final    Comment: (NOTE) The Xpert Xpress SARS-CoV-2/FLU/RSV plus assay is intended as an aid in the diagnosis of influenza from Nasopharyngeal swab specimens and should not be used as a sole basis for treatment. Nasal washings and aspirates are unacceptable for  Xpert Xpress SARS-CoV-2/FLU/RSV testing.  Fact Sheet for Patients: EntrepreneurPulse.com.au  Fact Sheet for Healthcare Providers: IncredibleEmployment.be  This test is not yet approved or cleared by the [place] FDA and has been authorized for detection and/or diagnosis of SARS-CoV-2 by FDA under an Emergency Use Authorization (EUA). This EUA will remain in effect (meaning this test can be used) for the duration of the COVID-19 declaration under Section 564(b)(1) of the Act, 21 U.S.C. section 360bbb-3(b)(1), unless the authorization is terminated or revoked.  Performed at [facility], [address] [place], [place] [address]   Aerobic/Anaerobic Culture w Gram Stain (surgical/deep wound)     Status: None (Preliminary result)   Collection Time: 11/25/20 10:24 AM   Specimen: Foot; Wound  Result Value Ref Range Status   Specimen Description   Final    FOOT LEFT Performed at [facility], [place] [address] [place], [place] [address]    Special Requests   Final    NONE Performed at [facility], [place], [place] [address]    Gram Stain   Final    RARE WBC PRESENT, PREDOMINANTLY MONONUCLEAR FEW GRAM POSITIVE COCCI FEW GRAM POSITIVE RODS GRAM NEGATIVE RODS Performed at [facility], [place] [address], [place], [place] [address]    Culture   Final    ABUNDANT PSEUDOMONAS AERUGINOSA ABUNDANT DIPHTHEROIDS(CORYNEBACTERIUM SPECIES) Standardized susceptibility testing for this organism is not available. NO ANAEROBES ISOLATED; CULTURE IN PROGRESS FOR 5 DAYS    Report Status PENDING  Incomplete   Organism ID, Bacteria PSEUDOMONAS AERUGINOSA  Final      Susceptibility   Pseudomonas aeruginosa - MIC*    CEFTAZIDIME 4 SENSITIVE Sensitive     CIPROFLOXACIN <=0.25 SENSITIVE Sensitive     GENTAMICIN <=1 SENSITIVE Sensitive     IMIPENEM 1 SENSITIVE Sensitive     PIP/TAZO 8 SENSITIVE Sensitive      CEFEPIME 2 SENSITIVE Sensitive     * ABUNDANT PSEUDOMONAS AERUGINOSA  CULTURE, BLOOD (ROUTINE X 2) w Reflex to ID Panel     Status: None   Collection Time: 11/25/20 12:48 PM   Specimen: BLOOD  Result Value Ref Range Status   Specimen Description BLOOD LEFT ANTECUBITAL  Final   Special Requests   Final    BOTTLES DRAWN AEROBIC AND ANAEROBIC Blood Culture adequate volume   Culture   Final    NO GROWTH 5 DAYS Performed at [facility], [address] [place], [place] [address]    Report Status 11/30/2020 FINAL  Final  CULTURE, BLOOD (ROUTINE X 2) w Reflex to ID Panel     Status: None   Collection Time: 11/25/20  12:55 PM   Specimen: BLOOD  Result Value Ref Range Status   Specimen Description BLOOD BLOOD LEFT HAND  Final   Special Requests   Final    BOTTLES DRAWN AEROBIC AND ANAEROBIC Blood Culture results may not be optimal due to an inadequate volume of blood received in culture bottles   Culture   Final    NO GROWTH 5 DAYS Performed at [facility], [address] [place], [place] [address]    Report Status 11/30/2020 FINAL  Final         Radiology Studies: DG Chest 2 View  Result Date: 11/29/2020 CLINICAL DATA:  Aspiration pneumonia EXAM: CHEST - 2 VIEW COMPARISON:  11/25/2020 FINDINGS: Hiatal hernia again noted. No confluent airspace opacities or effusions. No acute bony abnormality. IMPRESSION: Hiatal hernia. No active cardiopulmonary disease. Electronically Signed   By: Rolm Baptise M.D.   On: 11/29/2020 12:21        Scheduled Meds:  [MAR Hold] vitamin C  250 mg Oral BID   [MAR Hold] atorvastatin  10 mg Oral Daily   [MAR Hold] busPIRone  7.5 mg Oral BID   [MAR Hold] carvedilol  3.125 mg Oral BID WC   [MAR Hold] clopidogrel  75 mg Oral Daily   [MAR Hold] feeding supplement  237 mL Oral TID BM   [MAR Hold] ferrous sulfate  325 mg Oral Q breakfast   [MAR Hold] furosemide  20 mg Oral Daily   [MAR Hold] lisinopril  5 mg Oral Daily   [MAR Hold]  multivitamin with minerals  1 tablet Oral Daily   [MAR Hold] multivitamin-lutein  1 capsule Oral Daily   [MAR Hold] nicotine  21 mg Transdermal Daily   [MAR Hold] pantoprazole  40 mg Oral Daily   [MAR Hold] sertraline  100 mg Oral BID   Continuous Infusions:  [MAR Hold] sodium chloride Stopped (11/26/20 1306)   [MAR Hold]  ceFAZolin (ANCEF) IV     [MAR Hold] piperacillin-tazobactam (ZOSYN)  IV 3.375 g (11/30/20 0624)     LOS: 5 days    Time spent: 27 minutes    Sharen Hones, MD [facility]   To contact the attending provider between 7A-7P or the covering provider during after hours 7P-7A, please log into the web site [email or web address] and access using universal [place] password for that web site. If you do not have the password, please call the hospital operator.  11/30/2020, 1:10 PM

## 2020-11-30 NOTE — Anesthesia Procedure Notes (Addendum)
Procedure Name: General with mask airway Date/Time: 11/30/2020 2:02 PM Performed by: Kelton Pillar, CRNA Pre-anesthesia Checklist: Patient identified, Emergency Drugs available, Suction available and Patient being monitored Patient Re-evaluated:Patient Re-evaluated prior to induction Oxygen Delivery Method: Simple face mask Induction Type: IV induction Placement Confirmation: positive ETCO2 and CO2 detector Dental Injury: Teeth and Oropharynx as per pre-operative assessment

## 2020-11-30 NOTE — Interval H&P Note (Signed)
History and Physical Interval Note:  11/30/2020 1:08 PM  Michele Matthews  has presented today for surgery, with the diagnosis of Atherosclerotic Disease With Ulceration.  The various methods of treatment have been discussed with the patient and family. After consideration of risks, benefits and other options for treatment, the patient has consented to  Procedure(s): Lower Extremity Angiography (Left) as a surgical intervention.  The patient's history has been reviewed, patient examined, no change in status, stable for surgery.  I have reviewed the patient's chart and labs.  Questions were answered to the patient's satisfaction.     Leotis Pain

## 2020-11-30 NOTE — Progress Notes (Signed)
PT Cancellation Note  Patient Details Name: Michele Matthews MRN: [id number] DOB: August 09, 1945   Cancelled Treatment:    Reason Eval/Treat Not Completed: Medical issues which prohibited therapy (Pt off floor for vascular procedure. Will attempt evaluation again at later date/time.)  2:54 PM, 11/30/20 Etta Grandchild, PT, DPT Physical Therapist - [facility]  [phone] ([place])    Tovey C 11/30/2020, 2:54 PM

## 2020-11-30 NOTE — Care Management Important Message (Signed)
Important Message  Patient Details  Name: Michele Matthews MRN: [id number] Date of Birth: Dec 11, 1945   Medicare Important Message Given:  Yes     Juliann Pulse A [name] 11/30/2020, 3:12 PM

## 2020-11-30 NOTE — Progress Notes (Signed)
OT Cancellation Note  Patient Details Name: Michele Matthews MRN: [id number] DOB: May 17, 1945   Cancelled Treatment:    Reason Eval/Treat Not Completed: Patient at procedure or test/ unavailable. Consult received, chart reviewed. Pt noted out of room for procedure. Continue at transfer noted in consult. Will re-attempt OT evaluation next date as appropriate.   Hanley Hays, MPH, MS, OTR/L ascom [phone] 11/30/20, 2:54 PM

## 2020-11-30 NOTE — Op Note (Signed)
[facility]  Percutaneous Study/Intervention Procedural Note   Date of Surgery: 11/30/2020  Surgeon(s):Myda Detwiler    Assistants:none  Pre-operative Diagnosis: PAD with ulceration and gangrenous changes to the left foot  Post-operative diagnosis:  Same  Procedure(s) Performed:             1.  Ultrasound guidance for vascular access right femoral artery             2.  Catheter placement into left common femoral artery from right femoral approach             3.  Selective left lower extremity angiogram             4.  Percutaneous transluminal angioplasty of left anterior tibial artery with 2 mm diameter angioplasty balloon             5.  Viabahn stent placement to the left popliteal artery with 5 mm diameter by 5 cm length stent  6.  Viabahn stent placement to the left proximal SFA with 6 mm diameter by 5 cm length stent             7.  StarClose closure device right femoral artery  EBL: 10 cc  Contrast: 40 cc  Fluoro Time: 5.2 minutes  Anesthesia: MAC              Indications:  Patient is a 75 y.o.female with ulceration and gangrenous changes to the left foot.  Her previous angiogram terminated early due to patient continuous motion and combativeness.  The patient is brought in for angiography for further evaluation and potential treatment.  Due to the limb threatening nature of the situation, angiogram was performed for attempted limb salvage. The patient is aware that if the procedure fails, amputation would be expected.  The patient also understands that even with successful revascularization, amputation may still be required due to the severity of the situation. Risks and benefits are discussed and informed consent is obtained.   Procedure:  The patient was identified and appropriate procedural time out was performed.  The patient was then placed supine on the table and prepped and draped in the usual sterile fashion. Moderate conscious sedation was  administered during a face to face encounter with the patient throughout the procedure with my supervision of the RN administering medicines and monitoring the patient's vital signs, pulse oximetry, telemetry and mental status throughout from the start of the procedure until the patient was taken to the recovery room. Ultrasound was used to evaluate the right common femoral artery.  It was patent .  A digital ultrasound image was acquired.  A Seldinger needle was used to access the right common femoral artery under direct ultrasound guidance and a permanent image was performed.  A 0.035 J wire was advanced without resistance and a 5Fr sheath was placed.   I then crossed the aortic bifurcation and advanced to the left femoral head. Selective left lower extremity angiogram was then performed. This demonstrated dissection stenosis in the proximal SFA just above the previously placed stent creating greater than 50% stenosis.  There is also some dissection creating greater than 50% stenosis in the popliteal artery below the previously placed stents that extended down into the tibial vessels.  There was a moderate stenosis in the 60 to 70% range at the origin of the anterior tibial artery but it was then continuous to the foot.  The peroneal artery that was treated previously was patent and had good  flow.  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 It was felt that it was in the patient's best interest to proceed with intervention after these images to avoid a second procedure and a larger amount of contrast and fluoroscopy based off of the findings from the initial angiogram. The patient was systemically heparinized and a 6 [place] Ansell sheath was then placed over the [employer] wire. I then used a Kumpe catheter and the advantage wire to navigate through the SFA and popliteal lesions and down into the proximal anterior tibial artery where I exchanged for a V 18 wire which was  parked distally.  The proximal anterior tibial artery was then treated with a 2 mm diameter by 6 cm length angioplasty balloon inflated to 14 atm for 1 minute.  Completion imaging following this showed about a 20% residual stenosis.  I then stented the area in the popliteal artery below the previously placed stents with a 5 mm diameter by 5 cm length Viabahn stent postdilated with a 4 mm diameter Lutonix drug-coated angioplasty balloon with less than 10% residual stenosis.  I then stented the origin of the SFA with a 6 mm diameter by 5 cm length Viabahn stent postdilated with a 5 mm diam Lutonix drug-coated angioplasty balloon with less than 10% residual stenosis and good flow distally. I elected to terminate the procedure. The sheath was removed and StarClose closure device was deployed in the right femoral artery with excellent hemostatic result. The patient was taken to the recovery room in stable condition having tolerated the procedure well.  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  There is also some dissection creating greater than 50% stenosis in the popliteal artery below the previously placed stents that extended down into the tibial vessels.  There was a moderate stenosis in the 60 to 70% range at the origin of the anterior tibial artery but it was then continuous to the foot.  The peroneal artery that was treated previously was patent and had good flow.  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   Disposition: Patient was taken to the recovery room in stable condition having tolerated the procedure well.  Complications: None  Leotis Pain 11/30/2020 2:45 PM   This note was created with Dragon Medical transcription system. Any errors in dictation are purely unintentional.

## 2020-11-30 NOTE — Transfer of Care (Signed)
Immediate Anesthesia Transfer of Care Note  [name]  Procedure(s) Performed: Lower Extremity Angiography (Left)  Patient Location: Cath Lab  Anesthesia Type:MAC  Level of Consciousness: drowsy and patient cooperative  Airway & Oxygen Therapy: Patient Spontanous Breathing and Patient connected to face mask oxygen  Post-op Assessment: Report given to RN and Post -op Vital signs reviewed and stable  Post vital signs: Reviewed and stable  Last Vitals:  Vitals Value Taken Time  BP 110/68 11/30/20 1457  Temp    Pulse 86 11/30/20 1459  Resp 17 11/30/20 1459  SpO2 98 % 11/30/20 1459  Vitals shown include unvalidated device data.  Last Pain:  Vitals:   11/30/20 1219  TempSrc: Oral  PainSc:          Complications: No notable events documented.

## 2020-11-30 NOTE — Anesthesia Preprocedure Evaluation (Signed)
Anesthesia Evaluation  Patient identified by MRN, date of birth, ID band Patient awake    Reviewed: Allergy & Precautions, H&P , NPO status , Patient's Chart, lab work & pertinent test results, reviewed documented beta blocker date and time   Airway Mallampati: II   Neck ROM: full    Dental  (+) Poor Dentition, Teeth Intact   Pulmonary COPD, Current Smoker,  Last smoked 2 weeks ago and currently is short of breath.     Pulmonary exam normal        Cardiovascular Exercise Tolerance: Poor hypertension, On Medications + CAD, +CHF and + Orthopnea  Normal cardiovascular exam Rhythm:regular Rate:Normal     Neuro/Psych Seizures -, Well Controlled,  PSYCHIATRIC DISORDERS Anxiety Depression Complains of right side weakness.  CVA, Residual Symptoms    GI/Hepatic Neg liver ROS, GERD  Medicated,  Endo/Other  negative endocrine ROS  Renal/GU negative Renal ROS  negative genitourinary   Musculoskeletal   Abdominal   Peds  Hematology  (+) Blood dyscrasia, anemia ,   Anesthesia Other Findings Past Medical History: No date: Allergy No date: COPD (chronic obstructive pulmonary disease) (HCC) No date: Coronary artery disease No date: Depression No date: Emphysema of lung (HCC) No date: GERD (gastroesophageal reflux disease) No date: Hyperlipidemia No date: Hypertension No date: Seizures ([place]) No date: Stroke [facility]) Past Surgical History: 08/10/2015: ESOPHAGOGASTRODUODENOSCOPY; N/A     Comment:  Procedure: ESOPHAGOGASTRODUODENOSCOPY (EGD);  Surgeon:               Manya Silvas, MD;  Location: [facility] ENDOSCOPY;                Service: Endoscopy;  Laterality: N/A; 12/11/2014: ESOPHAGOGASTRODUODENOSCOPY (EGD) WITH PROPOFOL; N/A     Comment:  Procedure: ESOPHAGOGASTRODUODENOSCOPY (EGD) WITH               PROPOFOL;  Surgeon: Lucilla Lame, MD;  Location: [facility]               ENDOSCOPY;  Service: Endoscopy;  Laterality: N/A; 12/29/2015:  ESOPHAGOGASTRODUODENOSCOPY (EGD) WITH PROPOFOL; N/A     Comment:  Procedure: ESOPHAGOGASTRODUODENOSCOPY (EGD) WITH               PROPOFOL;  Surgeon: Mauri Pole, MD;  Location: [facility]              ENDOSCOPY;  Service: Endoscopy;  Laterality: N/A; 11/25/2020: LOWER EXTREMITY ANGIOGRAPHY; Left     Comment:  Procedure: Lower Extremity Angiography;  Surgeon: Algernon Huxley, MD;  Location: [place] CV LAB;  Service:               Cardiovascular;  Laterality: Left; BMI    Body Mass Index: 22.23 kg/m     Reproductive/Obstetrics negative OB ROS                             Anesthesia Physical Anesthesia Plan  ASA: 4  Anesthesia Plan: General   Post-op Pain Management:    Induction:   PONV Risk Score and Plan:   Airway Management Planned:   Additional Equipment:   Intra-op Plan:   Post-operative Plan:   Informed Consent: I have reviewed the patients History and Physical, chart, labs and discussed the procedure including the risks, benefits and alternatives for the proposed anesthesia with the patient or authorized representative who has indicated his/her understanding and acceptance.  Dental Advisory Given  Plan Discussed with: CRNA  Anesthesia Plan Comments:         Anesthesia Quick Evaluation

## 2020-11-30 NOTE — Progress Notes (Addendum)
PODIATRY / FOOT AND ANKLE SURGERY PROGRESS NOTE  Requesting Physician: Dr. Blaine Hamper  Reason for consult: Left foot wound/infection  Chief Complaint: Left foot wounds, pain   HPI: Michele Matthews is a 75 y.o. female who presents in bed today resting comfortably after undergoing another CT angiogram with intervention.  Patient states that overall this is the best that she has felt since she has been admitted and she is not having as much pain to the left foot today.  Patient is very fearful of left lower extremity limb loss and wants to do everything possible to try to save her left foot.  PMHx:  Past Medical History:  Diagnosis Date   Allergy    COPD (chronic obstructive pulmonary disease) ([place])    Coronary artery disease    Depression    Emphysema of lung (HCC)    GERD (gastroesophageal reflux disease)    Hyperlipidemia    Hypertension    Seizures ([place])    Stroke [facility])     Surgical Hx:  Past Surgical History:  Procedure Laterality Date   ESOPHAGOGASTRODUODENOSCOPY N/A 08/10/2015   Procedure: ESOPHAGOGASTRODUODENOSCOPY (EGD);  Surgeon: Manya Silvas, MD;  Location: [facility] ENDOSCOPY;  Service: Endoscopy;  Laterality: N/A;   ESOPHAGOGASTRODUODENOSCOPY (EGD) WITH PROPOFOL N/A 12/11/2014   Procedure: ESOPHAGOGASTRODUODENOSCOPY (EGD) WITH PROPOFOL;  Surgeon: Lucilla Lame, MD;  Location: [facility] ENDOSCOPY;  Service: Endoscopy;  Laterality: N/A;   ESOPHAGOGASTRODUODENOSCOPY (EGD) WITH PROPOFOL N/A 12/29/2015   Procedure: ESOPHAGOGASTRODUODENOSCOPY (EGD) WITH PROPOFOL;  Surgeon: Mauri Pole, MD;  Location: [facility] ENDOSCOPY;  Service: Endoscopy;  Laterality: N/A;   LOWER EXTREMITY ANGIOGRAPHY Left 11/25/2020   Procedure: Lower Extremity Angiography;  Surgeon: Algernon Huxley, MD;  Location: [place] CV LAB;  Service: Cardiovascular;  Laterality: Left;    FHx:  Family History  Problem Relation Age of Onset   Alzheimer's disease Father    Lung cancer Sister    Breast cancer Sister    Cancer  Mother     Social History:  reports that she has been smoking. She has been smoking an average of 1 pack per day. She uses smokeless tobacco. She reports that she does not drink alcohol and does not use drugs.  Allergies: No Known Allergies  Medications Prior to Admission  Medication Sig Dispense Refill   atorvastatin (LIPITOR) 10 MG tablet TAKE 1 TABLET EVERY DAY 90 tablet 0   busPIRone (BUSPAR) 7.5 MG tablet Take 1 tablet (7.5 mg total) by mouth 2 (two) times daily. 180 tablet 3   carvedilol (COREG) 3.125 MG tablet Take 1 tablet (3.125 mg total) by mouth 2 (two) times daily with a meal. 60 tablet 2   clopidogrel (PLAVIX) 75 MG tablet Take 75 mg by mouth daily.     diphenhydramine-acetaminophen (TYLENOL PM) 25-500 MG TABS tablet Take 1 tablet by mouth at bedtime as needed.     furosemide (LASIX) 20 MG tablet Take 1 tablet (20 mg total) by mouth daily. 30 tablet 1   hydrOXYzine (ATARAX/VISTARIL) 10 MG tablet Take 1 tablet (10 mg total) by mouth 2 (two) times daily as needed for anxiety (sleep). 60 tablet 11   lisinopril (ZESTRIL) 5 MG tablet Take 5 mg by mouth daily.     Multiple Vitamin (MULTIVITAMIN) tablet Take 1 tablet by mouth daily.     pantoprazole (PROTONIX) 40 MG tablet TAKE 1 TABLET EVERY DAY 90 tablet 0   sertraline (ZOLOFT) 100 MG tablet Take 1 tablet (100 mg total) by mouth 2 (two) times daily.  180 tablet 3   aspirin EC 81 MG tablet Take 81 mg by mouth daily. (Patient not taking: Reported on 11/25/2020)     feeding supplement, ENSURE ENLIVE, (ENSURE ENLIVE) LIQD Take 237 mLs by mouth 3 (three) times daily between meals. 237 mL 12   Tetrahydrozoline HCl (VISINE OP) Place 1 drop into both eyes 2 (two) times daily as needed (dry eyes).     warfarin (COUMADIN) 5 MG tablet TAKE 1 TABLET ON THURSDAYS & SUNDAYS AND TAKE 1/2 TABLET ON MONDAYS, TUESDAYS, WEDNESDAYS,  [place] (Patient not taking: Reported on 11/25/2020) 59 tablet 0     Physical Exam: General: Alert and  oriented.  No apparent distress.  Vascular: DP/PT pulses nonpalpable left lower extremity, faintly palpable right lower extremity.  Capillary fill time delayed to the left forefoot.  Dependent rubor of the left lower extremity that is improved with elevation.  Neuro: Light touch sensation intact to digits both feet.  Derm: Multiple ulcerations present to the left dorsal forefoot over the left hallux.  Tendon appears to be exposed at this level.  Wounds also present over the fourth and fifth metatarsal phalangeal joints with once again tendon appearing to be exposed.  No obvious bone exposure at this time.  Appears to have dry scab formation/eschars over the area of the wounds but does appear to be more wet today overall consistent with changes to wet gangrene, mild associated serous drainage, moderate odor, corresponding erythema and edema present once again is reduced with elevation.  Relatively unchanged overall since last visit with less pain on palpation today and slightly less erythema.    MSK: Pain on palpation left forefoot.  Limited ankle joint dorsiflexion with the knee extended which is minimally improved with knee flexion bilateral.  Hammertoe contractures digits 1 through 5 bilateral.  Pes cavus foot structure bilateral.  Results for orders placed or performed during the hospital encounter of 11/25/20 (from the past 48 hour(s))  Glucose, capillary     Status: Abnormal   Collection Time: 11/29/20  8:03 AM  Result Value Ref Range   Glucose-Capillary 110 (H) 70 - 99 mg/dL    Comment: Glucose reference range applies only to samples taken after fasting for at least 8 hours.   Comment 1 Notify RN    Comment 2 Document in Chart   Glucose, capillary     Status: Abnormal   Collection Time: 11/30/20  7:47 AM  Result Value Ref Range   Glucose-Capillary 115 (H) 70 - 99 mg/dL    Comment: Glucose reference range applies only to samples taken after fasting for at least 8 hours.   DG Chest 2  View  Result Date: 11/29/2020 CLINICAL DATA:  Aspiration pneumonia EXAM: CHEST - 2 VIEW COMPARISON:  11/25/2020 FINDINGS: Hiatal hernia again noted. No confluent airspace opacities or effusions. No acute bony abnormality. IMPRESSION: Hiatal hernia. No active cardiopulmonary disease. Electronically Signed   By: Rolm Baptise M.D.   On: 11/29/2020 12:21   PERIPHERAL VASCULAR CATHETERIZATION  Result Date: 11/30/2020 See surgical note for result.   Blood pressure 133/68, pulse 87, temperature 98 F (36.7 C), resp. rate 20, height '5\' 5"'$  (1.651 m), weight 60.6 kg, SpO2 93 %.   Assessment Ischemic ulcerations to the left dorsal foot, gangrene Left foot cellulitis PVD with potential critical limb ischemia Equinus left foot  Plan -Patient seen and examined. -X-ray imaging reviewed and discussed with patient in detail previously.  No osteomyelitis present. -MRI still pending.  If unable to  be performed today then would recommend canceling the MRI as patient will need TMA or more proximal amputation regardless. -Patient's wounds painful today on palpation and difficult to examine the depth but it appears that at least tendon is exposed to potentially joint capsule/periosteum.   -Wound culture taken, Pseudomonas and diphtheria voids present.  Sensitive to ciprofloxacin. -Recommend Betadine wet-to-dry dressings daily. -Appreciate medicine recommendations for IV antibiotic therapy as well as pain management. -Appreciate vascular recommendations.  Apparently able to open up peroneal and anterior tibial arteries but still does not have runoff through posterior tibial.  They believe that she has been optimized for potential surgical intervention at this time with no further treatments indicated. -Discussed with patient all treatment options involving both conservative and surgical attempts at correction clean potential risks and complications at this time patient is elected for surgical procedure consisting  of left transmetatarsal amputation.  Discussed with patient that there are no guarantees with the procedure and the procedure still has a high likelihood of failing due to patient's ischemic limb and still diminished blood flow despite improvements.  Patient also has substantial tissue loss to the area of the metatarsal phalangeal joints and areas of ulcerative areas.  Discussed with patient that we can attempt a transmetatarsal amputation but if it fails she will ultimately likely end up needing a below-knee amputation/more proximal amputation by vascular surgery.  Patient understands and is agreeable to procedure.  Orders placed in chart.  Unlikely to perform tendo Achilles lengthening due to pes cavus foot type which could potentially lead to calcaneal gait postoperatively if patient heals. -Patient to be n.p.o. around 9 AM on 12/02/2020 for surgical intervention around 5:30 PM that day.  Caroline More, DPM 11/30/2020, 7:26 PM

## 2020-12-01 ENCOUNTER — Encounter: Payer: Self-pay | Admitting: Vascular Surgery

## 2020-12-01 DIAGNOSIS — L089 Local infection of the skin and subcutaneous tissue, unspecified: Secondary | ICD-10-CM | POA: Diagnosis not present

## 2020-12-01 DIAGNOSIS — D500 Iron deficiency anemia secondary to blood loss (chronic): Secondary | ICD-10-CM | POA: Diagnosis not present

## 2020-12-01 DIAGNOSIS — I5042 Chronic combined systolic (congestive) and diastolic (congestive) heart failure: Secondary | ICD-10-CM | POA: Diagnosis not present

## 2020-12-01 LAB — CBC WITH DIFFERENTIAL/PLATELET
Abs Immature Granulocytes: 0.06 10*3/uL (ref 0.00–0.07)
Basophils Absolute: 0 10*3/uL (ref 0.0–0.1)
Basophils Relative: 0 %
Eosinophils Absolute: 0 10*3/uL (ref 0.0–0.5)
Eosinophils Relative: 0 %
HCT: 30.4 % — ABNORMAL LOW (ref 36.0–46.0)
Hemoglobin: 9.2 g/dL — ABNORMAL LOW (ref 12.0–15.0)
Immature Granulocytes: 1 %
Lymphocytes Relative: 5 %
Lymphs Abs: 0.6 10*3/uL — ABNORMAL LOW (ref 0.7–4.0)
MCH: 22.3 pg — ABNORMAL LOW (ref 26.0–34.0)
MCHC: 30.3 g/dL (ref 30.0–36.0)
MCV: 73.8 fL — ABNORMAL LOW (ref 80.0–100.0)
Monocytes Absolute: 0.6 10*3/uL (ref 0.1–1.0)
Monocytes Relative: 6 %
Neutro Abs: 9.5 10*3/uL — ABNORMAL HIGH (ref 1.7–7.7)
Neutrophils Relative %: 88 %
Platelets: 266 10*3/uL (ref 150–400)
RBC: 4.12 MIL/uL (ref 3.87–5.11)
RDW: 25.7 % — ABNORMAL HIGH (ref 11.5–15.5)
Smear Review: NORMAL
WBC: 10.7 10*3/uL — ABNORMAL HIGH (ref 4.0–10.5)
nRBC: 0 % (ref 0.0–0.2)

## 2020-12-01 LAB — GLUCOSE, CAPILLARY: Glucose-Capillary: 164 mg/dL — ABNORMAL HIGH (ref 70–99)

## 2020-12-01 MED ORDER — SENNOSIDES-DOCUSATE SODIUM 8.6-50 MG PO TABS
2.0000 | ORAL_TABLET | Freq: Two times a day (BID) | ORAL | Status: DC
  Administered 2020-12-01 – 2020-12-08 (×12): 2 via ORAL
  Filled 2020-12-01 (×12): qty 2

## 2020-12-01 MED ORDER — LACTULOSE 10 GM/15ML PO SOLN
20.0000 g | Freq: Once | ORAL | Status: AC
  Administered 2020-12-01: 20 g via ORAL
  Filled 2020-12-01: qty 30

## 2020-12-01 MED ORDER — CHLORHEXIDINE GLUCONATE 4 % EX LIQD: 60.0000 mL | Freq: Once | CUTANEOUS | Status: DC

## 2020-12-01 NOTE — Progress Notes (Signed)
OT Cancellation Note  Patient Details Name: Michele Matthews MRN: [id number] DOB: December 19, 1945   Cancelled Treatment:    Reason Eval/Treat Not Completed: Other (comment). OT order received and chart reviewed. OT made multiple attempts to see pt this morning but surgical boot has not arrived to room for therapist to safely transfer pt. OT will re-attempt this afternoon in hopes of equipment being available.   Darleen Crocker, MS, OTR/L , CBIS ascom [phone]  12/01/20, 11:33 AM

## 2020-12-01 NOTE — TOC Progression Note (Signed)
Transition of Care [facility]) - Progression Note    Patient Details  Name: Michele Matthews MRN: [id number] Date of Birth: 1945/06/19  [facility]) CM/SW Contact  Beverly Sessions, RN Phone Number: 12/01/2020, 10:48 AM  Clinical Narrative:    Plan for TMA tomorrow Met with patient at bedside in attempt to complete assessment.  Speech currently at bedside. Will attempt at at later time        Expected Discharge Plan and Services                                                 Social Determinants of Health (SDOH) Interventions    Readmission Risk Interventions No flowsheet data found.

## 2020-12-01 NOTE — Progress Notes (Signed)
PT Cancellation Note  Patient Details Name: Michele Matthews MRN: [id number] DOB: Jan 15, 1946   Cancelled Treatment:    Reason Eval/Treat Not Completed: Other (comment).  Pt chart reviewed and attempted to evaluate pt.  Boot not present in room, preventing ambulation.  PA also in room assessing pt.  Will attempt at later date/time as medically necessary.   Gwenlyn Saran, PT, DPT 12/01/20, 11:29 AM

## 2020-12-01 NOTE — Progress Notes (Signed)
Pt refused dressing changed on her L ft this morning.

## 2020-12-01 NOTE — Evaluation (Signed)
Clinical/Bedside Swallow Evaluation Patient Details  Name: Michele Matthews MRN: [id number] Date of Birth: 16-Feb-1946  Today's Date: 12/01/2020 Time: SLP Start Time (ACUTE ONLY): 31 SLP Stop Time (ACUTE ONLY): 1140 SLP Time Calculation (min) (ACUTE ONLY): 60 min  Past Medical History:  Past Medical History:  Diagnosis Date   Allergy    COPD (chronic obstructive pulmonary disease) ([place])    Coronary artery disease    Depression    Emphysema of lung (HCC)    GERD (gastroesophageal reflux disease)    Hyperlipidemia    Hypertension    Seizures ([place])    Stroke [facility])    Past Surgical History:  Past Surgical History:  Procedure Laterality Date   ESOPHAGOGASTRODUODENOSCOPY N/A 08/10/2015   Procedure: ESOPHAGOGASTRODUODENOSCOPY (EGD);  Surgeon: Manya Silvas, MD;  Location: [facility] ENDOSCOPY;  Service: Endoscopy;  Laterality: N/A;   ESOPHAGOGASTRODUODENOSCOPY (EGD) WITH PROPOFOL N/A 12/11/2014   Procedure: ESOPHAGOGASTRODUODENOSCOPY (EGD) WITH PROPOFOL;  Surgeon: Lucilla Lame, MD;  Location: [facility] ENDOSCOPY;  Service: Endoscopy;  Laterality: N/A;   ESOPHAGOGASTRODUODENOSCOPY (EGD) WITH PROPOFOL N/A 12/29/2015   Procedure: ESOPHAGOGASTRODUODENOSCOPY (EGD) WITH PROPOFOL;  Surgeon: Mauri Pole, MD;  Location: [facility] ENDOSCOPY;  Service: Endoscopy;  Laterality: N/A;   LOWER EXTREMITY ANGIOGRAPHY Left 11/25/2020   Procedure: Lower Extremity Angiography;  Surgeon: Algernon Huxley, MD;  Location: [place] CV LAB;  Service: Cardiovascular;  Laterality: Left;   LOWER EXTREMITY ANGIOGRAPHY Left 11/30/2020   Procedure: Lower Extremity Angiography;  Surgeon: Algernon Huxley, MD;  Location: [place] CV LAB;  Service: Cardiovascular;  Laterality: Left;   HPI:  Pt is a 75 y.o. female with medical history significant of HOH, HTN, HLD, COPD, stroke on Coumadin, GERD, Large Hiatal Hernia per chart, depression with anxiety, seizure, CAD, sCHF with EF 25-30&, UGIB, anemia, tobacco abuse, HOH, COPD, who present  with left foot pain and left foot wound with infection.  Pt states that she has left foot pain and wounds on the dorsal side of left wound for more than 3 weeks, which has has been progressively worsening.  The pain is constant, sharp, 10 out of 10 severity, nonradiating.  She also has foul-smelling drainage from the wounds.  CXRs x2 this admit: Large Hiatal hernia; No active cardiopulmonary disease.  Pt appeared labile re: possible Surgery tomorrow.   Assessment / Plan / Recommendation Clinical Impression  Pt appears to present w/ adequate oropharyngeal phase swallowing function w/ No overt oropharyngeal phase dysphagia appreciated w/ trials consumed; No neuromuscular swallowing deficits appreciated. Pt is at reduced risk for aspiration from an oropharyngeal phase standpoint following general aspiration precautions. HOWEVER, pt has a baseline of REFLUX/GERD (on a PPI) and dx'd Large Hiatal Hernia per Imaging. ANY Esophageal Dysmotility or Regurgitation of Reflux material can increase risk for aspiration of the Reflux material during Retrograde flow thus impact Voicing and Pulmonary status. Pt described ongoing issues w/ "burning in my chest" and FULL FEELINGS pointing to mid sternum area then described the discomfort moves superiorly pointing to Sternal Notch area. She feels this impacts her ability to take full meals; only eats few bites of foods and soups, and thin liquids. She drinks Ensure at home.   Pt consumed trials of thin liquids Via Cup and straw, then tsps of puree and broken down solids moistened well w/ no immediate, overt clinical s/s of aspiration noted; clear vocal quality b/t trials, no decline in pulmonary status, no multiple swallows noted post initial pharyngeal swallow. Oral phase appeared [facility] for bolus management, mastication,  and timely A-P transfer/clearing of material. OM exam was [facility] for oral clearing; lingual/labial movements. No unilateral weakness. Speech clear. Of Note, pt  demonstrated fairly quick s/s of Esophageal irritation -- felt a globus feeling pointing at mid-upper sternum w/ few trials consumed. REST BREAKS given during po's.    Recommend continue Regluar diet for ease of choice of manageable foods as tolerates(w/ less meats/breads in diet, moistened foods) -- CUT, small bites of foods; thin liquids (less straw use d/t air swallowing); general aspiration precautions. Rest Breaks during meals/oral intake to allow for Esophageal clearing. REFLUX precautions strongly recommended d/t Large Hiatal Hernia to lessen chance for Regurgitation. Recommend pt continue f/u w/ GI for management of Reflux and tx as indicated. Disucssion and handouts given on Reflux to pt/husband present. Updated MD, NSG.  NSG to reconsult ST services if any new needs while admitted. SLP Visit Diagnosis: Dysphagia, unspecified (R13.10) (Esopahgeal phase dysmotility)    Aspiration Risk   (reduced from an oropharyngeal phase standpoint)    Diet Recommendation Regluar diet for ease of choice of manageable foods as tolerates(w/ less meats/breads in diet, moistened foods) -- CUT, small bites of foods; thin liquids (less straw use d/t air swallowing); general aspiration precautions. Rest Breaks during meals/oral intake to allow for Esophageal clearing. REFLUX precautions strongly recommended d/t Large Hiatal Hernia to lessen chance for Regurgitation.     Medication Administration: Whole meds with liquid (1-2 at a time; whole in puree as needed)    Other  Recommendations Recommended Consults: Consider GI evaluation;Consider esophageal assessment (f/u as needed for education) Oral Care Recommendations: Oral care BID;Oral care before and after PO;Patient independent with oral care Other Recommendations:  (n/a)   Follow up Recommendations None      Frequency and Duration  (n/a)   (n/a)       Prognosis Prognosis for Safe Diet Advancement: Fair (-Good) Barriers to Reach Goals: Time post  onset;Severity of deficits Barriers/Prognosis Comment: baseline GERD; large hiatal hernia      Swallow Study   General Date of Onset: 11/25/20 HPI: Pt is a 75 y.o. female with medical history significant of HOH, HTN, HLD, COPD, stroke on Coumadin, GERD, Large Hiatal Hernia per chart, depression with anxiety, seizure, CAD, sCHF with EF 25-30&, UGIB, anemia, tobacco abuse, HOH, COPD, who present with left foot pain and left foot wound with infection.  Pt states that she has left foot pain and wounds on the dorsal side of left wound for more than 3 weeks, which has has been progressively worsening.  The pain is constant, sharp, 10 out of 10 severity, nonradiating.  She also has foul-smelling drainage from the wounds.  CXRs x2 this admit: Large Hiatal hernia; No active cardiopulmonary disease.  Pt appeared labile re: possible Surgery tomorrow. Type of Study: Bedside Swallow Evaluation Previous Swallow Assessment: none Diet Prior to this Study: Regular;Thin liquids Temperature Spikes Noted: No (wbc 10.7) Respiratory Status: Nasal cannula (2L) History of Recent Intubation: No Behavior/Cognition: Alert;Cooperative;Pleasant mood;Distractible;Requires cueing (min HOH) Oral Cavity Assessment: Within Functional Limits;Dry (min) Oral Care Completed by SLP: Yes Oral Cavity - Dentition: Adequate natural dentition;Missing dentition (few+) Vision: Functional for self-feeding Self-Feeding Abilities: Able to feed self;Needs assist;Needs set up (weak, shakiness; contractures in fingers of L hand) Patient Positioning: Upright in bed (needed positioning support more upright) Baseline Vocal Quality: Normal Volitional Cough: Strong Volitional Swallow: Able to elicit    Oral/Motor/Sensory Function Overall Oral Motor/Sensory Function: Within functional limits (grossly)   Ice Chips Ice chips: Within functional  limits Presentation: Spoon (fed; 3 trials)   Thin Liquid Thin Liquid: Within functional  limits Presentation: Cup;Self Fed;Straw (supported d/t shakiness; 10+ trials) Other Comments: cold did not bother    Nectar Thick Nectar Thick Liquid: Not tested   Honey Thick Honey Thick Liquid: Not tested   Puree Puree: Within functional limits Presentation: Spoon (fed; 10 trials) Other Comments: time given b/t   Solid     Solid: Within functional limits (grossly -- cut, small pieces) Presentation: Spoon (fed; 4 trials) Other Comments: time given b/t         Orinda Kenner, MS, [place] Speech Language Pathologist Rehab Services [phone] [place] 12/01/2020,2:21 PM

## 2020-12-01 NOTE — Progress Notes (Signed)
Chaplain Maggie made initial visit with patient per Order Requisition from physician. Patient's partner left the room during the visitation time and waited in the hallway. Patient expressed she is, "scared to death" about tomorrow's procedure. She shared her faith in [place] and agreed to pray with chaplain. After our time of prayer patient stated, "You helped me." Chaplain plans to follow up with patient after surgery.

## 2020-12-01 NOTE — Progress Notes (Signed)
OT Cancellation Note  Patient Details Name: Michele Matthews MRN: [id number] DOB: 1945-09-19   Cancelled Treatment:    Reason Eval/Treat Not Completed: Other (comment). OT making PM attempt with pt still not having required post op shoe delivered to unit. Pt's husband reports plan now to L transmetarsal amputation tomorrow. RN is unsure of information and no current note from physican to confirm. OT to continue to follow up tomorrow if pt is able to participate.   Darleen Crocker, MS, OTR/L , CBIS ascom [phone]  12/01/20, 4:12 PM

## 2020-12-01 NOTE — Progress Notes (Addendum)
PROGRESS NOTE    Michele Matthews  [id number] DOB: 11/02/1945 DOA: 11/25/2020 PCP: [facility]    Brief Narrative:  75 y.o. female with medical history significant of HTN, HLD, COPD, stroke on Coumadin, GERD, depression with anxiety, seizure, CAD, sCHF with EF 25-30&, UGIB, anemia, tobacco abuse, HOH, COPD, who present with left foot pain and left foot wound with infection. Patient had vascular intervention with left lower extremity angioplasty and stents on 7/27.  Repeat angiogram scheduled on 8/1. 7/31.  Wound culture grow Pseudomonas and Corynebacterium, antibiotics was produced Zosyn. 8/1.  Vascular surgery performed again with angioplasty of the left anterior tibial artery, stenting left popliteal artery, left proximal SFA.   Assessment & Plan:   Principal Problem:   Left foot infection Active Problems:   COPD (chronic obstructive pulmonary disease) (HCC)   Coronary artery disease   History of CVA with residual deficit   GERD (gastroesophageal reflux disease)   Hyperlipidemia, unspecified   Tobacco abuse   Anxiety and depression   Essential hypertension   Anemia, iron deficiency   Severe protein-calorie malnutrition (HCC)   Chronic combined systolic and diastolic CHF (congestive heart failure) (HCC)   Hypokalemia   Hyponatremia  Left foot ischemic ulceration with infection. Severe atherosclerotic vascular disease with gangrene Status post angioplasty and stents placed in left lower extremity.  Wound culture grew Pseudomonas and Corynebacterium.  Cover with Zosyn. Patient is scheduled for partial transmetatarsal amputation 8/3.   Iron deficient anemia. Received IV iron, hemoglobin has been stable.  Low-grade fever and cough. Chest x-ray did not show pneumonia, pending speech therapy evaluation. Does not have any additional fever for the last 24 hours.   Chronic combined systolic and diastolic congestive heart failure. COPD  History of CVA. Coronary  artery disease. Essential hypertension. Stable. .  DVT prophylaxis: SCDs Code Status: full Family Communication: Husband at the bedside. Disposition Plan:    Status is: Inpatient  Remains inpatient appropriate because:Ongoing active pain requiring inpatient pain management and Inpatient level of care appropriate due to severity of illness  Dispo: The patient is from: Home              Anticipated d/c is to: Home              Patient currently is not medically stable to d/c.   Difficult to place patient No        I/O last 3 completed shifts: In: [id number] [P.O.:240; I.V.:800; IV Piggyback:50] Out: 300 [Urine:300] Total I/O In: 120 [P.O.:120] Out: -      Consultants:  Vascular, podiatry  Procedures: vascular  Antimicrobials: Zosyn  Subjective: Patient doing well today, no fever chills. No significant cough today, no shortness of breath. No dysuria hematuria No abdominal pain nausea vomiting, had a normal bowel movement yesterday.   Objective: Vitals:   11/30/20 1614 11/30/20 2039 12/01/20 0419 12/01/20 0806  BP: 133/68 (!) 92/55 (!) 154/72 (!) 142/64  Pulse: 87 93 83 81  Resp: '20 20 16 20  '$ Temp:  98 F (36.7 C) 97.8 F (36.6 C) 97.8 F (36.6 C)  TempSrc:    Oral  SpO2: 93% 94% 95% 98%  Weight:      Height:        Intake/Output Summary (Last 24 hours) at 12/01/2020 1409 Last data filed at 12/01/2020 0900 Gross per 24 hour  Intake 1160 ml  Output --  Net 1160 ml   Filed Weights   11/25/20 1552 11/29/20 0401 11/30/20 0407  Weight:  59 kg 60.5 kg 60.6 kg    Examination:  General exam: Appears calm and comfortable  Respiratory system: Clear to auscultation. Respiratory effort normal. Cardiovascular system: S1 & S2 heard, RRR. No JVD, murmurs, rubs, gallops or clicks. No pedal edema. Gastrointestinal system: Abdomen is nondistended, soft and nontender. No organomegaly or masses felt. Normal bowel sounds heard. Central nervous system: Alert and oriented.  No focal neurological deficits. Extremities: Left foot ulcer Skin: No rashes, lesions or ulcers Psychiatry: Judgement and insight appear normal. Mood & affect appropriate.     Data Reviewed: I have personally reviewed following labs and imaging studies  CBC: Recent Labs  Lab 11/25/20 1023 11/26/20 0517 11/27/20 0443 11/28/20 0432 12/01/20 0459  WBC 8.6 10.4 12.9* 12.1* 10.7*  NEUTROABS 6.8  --   --   --  9.5*  HGB 8.3* 7.4* 6.8* 8.3* 9.2*  HCT 28.6* 25.2* 23.2* 26.5* 30.4*  MCV 68.1* 67.9* 66.9* 71.2* 73.8*  PLT 373 310 268 228 [id number]   Basic Metabolic Panel: Recent Labs  Lab 11/25/20 1023 11/26/20 0517 11/27/20 0443 11/28/20 0432  NA 139 137 136 133*  K 3.9 3.4* 3.1* 3.8  CL 104 105 103 101  CO2 '25 26 26 28  '$ GLUCOSE 101* 126* 118* 148*  BUN '18 22 13 15  '$ CREATININE 0.68 0.66 0.61 0.63  CALCIUM 9.7 9.2 8.9 9.1   GFR: Estimated Creatinine Clearance: 53.8 mL/min (by C-G formula based on SCr of 0.63 mg/dL). Liver Function Tests: Recent Labs  Lab 11/25/20 1023  AST 23  ALT 15  ALKPHOS 74  BILITOT 0.6  PROT 7.1  ALBUMIN 3.6   No results for input(s): LIPASE, AMYLASE in the last 168 hours. No results for input(s): AMMONIA in the last 168 hours. Coagulation Profile: Recent Labs  Lab 11/25/20 1248  INR 1.0   Cardiac Enzymes: No results for input(s): CKTOTAL, CKMB, CKMBINDEX, TROPONINI in the last 168 hours. BNP (last 3 results) No results for input(s): PROBNP in the last 8760 hours. HbA1C: No results for input(s): HGBA1C in the last 72 hours. CBG: Recent Labs  Lab 11/27/20 0749 11/28/20 0816 11/29/20 0803 11/30/20 0747 12/01/20 0947  GLUCAP 114* 119* 110* 115* 164*   Lipid Profile: No results for input(s): CHOL, HDL, LDLCALC, TRIG, CHOLHDL, LDLDIRECT in the last 72 hours. Thyroid Function Tests: No results for input(s): TSH, T4TOTAL, FREET4, T3FREE, THYROIDAB in the last 72 hours. Anemia Panel: No results for input(s): VITAMINB12, FOLATE,  FERRITIN, TIBC, IRON, RETICCTPCT in the last 72 hours. Sepsis Labs: Recent Labs  Lab 11/25/20 1024  LATICACIDVEN 0.9    Recent Results (from the past 240 hour(s))  Resp Panel by RT-PCR (Flu A&B, Covid) Nasopharyngeal Swab     Status: None   Collection Time: 11/25/20 10:24 AM   Specimen: Nasopharyngeal Swab; Nasopharyngeal(NP) swabs in vial transport medium  Result Value Ref Range Status   SARS Coronavirus 2 by RT PCR NEGATIVE NEGATIVE Final    Comment: (NOTE) SARS-CoV-2 target nucleic acids are NOT DETECTED.  The SARS-CoV-2 RNA is generally detectable in upper respiratory specimens during the acute phase of infection. The lowest concentration of SARS-CoV-2 viral copies this assay can detect is 138 copies/mL. A negative result does not preclude SARS-Cov-2 infection and should not be used as the sole basis for treatment or other patient management decisions. A negative result may occur with  improper specimen collection/handling, submission of specimen other than nasopharyngeal swab, presence of viral mutation(s) within the areas targeted by this assay, and inadequate  number of viral copies(<138 copies/mL). A negative result must be combined with clinical observations, patient history, and epidemiological information. The expected result is Negative.  Fact Sheet for Patients:  EntrepreneurPulse.com.au  Fact Sheet for Healthcare Providers:  IncredibleEmployment.be  This test is no t yet approved or cleared by the [place] FDA and  has been authorized for detection and/or diagnosis of SARS-CoV-2 by FDA under an Emergency Use Authorization (EUA). This EUA will remain  in effect (meaning this test can be used) for the duration of the COVID-19 declaration under Section 564(b)(1) of the Act, 21 U.S.C.section 360bbb-3(b)(1), unless the authorization is terminated  or revoked sooner.       Influenza A by PCR NEGATIVE NEGATIVE Final    Influenza B by PCR NEGATIVE NEGATIVE Final    Comment: (NOTE) The Xpert Xpress SARS-CoV-2/FLU/RSV plus assay is intended as an aid in the diagnosis of influenza from Nasopharyngeal swab specimens and should not be used as a sole basis for treatment. Nasal washings and aspirates are unacceptable for Xpert Xpress SARS-CoV-2/FLU/RSV testing.  Fact Sheet for Patients: EntrepreneurPulse.com.au  Fact Sheet for Healthcare Providers: IncredibleEmployment.be  This test is not yet approved or cleared by the [place] FDA and has been authorized for detection and/or diagnosis of SARS-CoV-2 by FDA under an Emergency Use Authorization (EUA). This EUA will remain in effect (meaning this test can be used) for the duration of the COVID-19 declaration under Section 564(b)(1) of the Act, 21 U.S.C. section 360bbb-3(b)(1), unless the authorization is terminated or revoked.  Performed at [facility], [address] [place], [place] [address]   Aerobic/Anaerobic Culture w Gram Stain (surgical/deep wound)     Status: None   Collection Time: 11/25/20 10:24 AM   Specimen: Foot; Wound  Result Value Ref Range Status   Specimen Description   Final    FOOT LEFT Performed at [facility], [place] [address] [place], [place] [address]    Special Requests   Final    NONE Performed at [facility], [place], [place] [address]    Gram Stain   Final    RARE WBC PRESENT, PREDOMINANTLY MONONUCLEAR FEW GRAM POSITIVE COCCI FEW GRAM POSITIVE RODS GRAM NEGATIVE RODS    Culture   Final    ABUNDANT PSEUDOMONAS AERUGINOSA ABUNDANT DIPHTHEROIDS(CORYNEBACTERIUM SPECIES) Standardized susceptibility testing for this organism is not available. NO ANAEROBES ISOLATED Performed at [facility], [place] [address] [place], [place] [address]    Report Status 11/30/2020 FINAL  Final   Organism ID, Bacteria PSEUDOMONAS AERUGINOSA  Final       Susceptibility   Pseudomonas aeruginosa - MIC*    CEFTAZIDIME 4 SENSITIVE Sensitive     CIPROFLOXACIN <=0.25 SENSITIVE Sensitive     GENTAMICIN <=1 SENSITIVE Sensitive     IMIPENEM 1 SENSITIVE Sensitive     PIP/TAZO 8 SENSITIVE Sensitive     CEFEPIME 2 SENSITIVE Sensitive     * ABUNDANT PSEUDOMONAS AERUGINOSA  CULTURE, BLOOD (ROUTINE X 2) w Reflex to ID Panel     Status: None   Collection Time: 11/25/20 12:48 PM   Specimen: BLOOD  Result Value Ref Range Status   Specimen Description BLOOD LEFT ANTECUBITAL  Final   Special Requests   Final    BOTTLES DRAWN AEROBIC AND ANAEROBIC Blood Culture adequate volume   Culture   Final    NO GROWTH 5 DAYS Performed at [facility], [address] [place], [place] [address]  Report Status 11/30/2020 FINAL  Final  CULTURE, BLOOD (ROUTINE X 2) w Reflex to ID Panel     Status: None   Collection Time: 11/25/20 12:55 PM   Specimen: BLOOD  Result Value Ref Range Status   Specimen Description BLOOD BLOOD LEFT HAND  Final   Special Requests   Final    BOTTLES DRAWN AEROBIC AND ANAEROBIC Blood Culture results may not be optimal due to an inadequate volume of blood received in culture bottles   Culture   Final    NO GROWTH 5 DAYS Performed at [facility], [address] [place], [place] [address]    Report Status 11/30/2020 FINAL  Final         Radiology Studies: PERIPHERAL VASCULAR CATHETERIZATION  Result Date: 11/30/2020 See surgical note for result.       Scheduled Meds:  vitamin C  250 mg Oral BID   atorvastatin  10 mg Oral Daily   busPIRone  7.5 mg Oral BID   carvedilol  3.125 mg Oral BID WC   clopidogrel  75 mg Oral Daily   feeding supplement  237 mL Oral TID BM   ferrous sulfate  325 mg Oral Q breakfast   furosemide  20 mg Oral Daily   lisinopril  5 mg Oral Daily   multivitamin with minerals  1 tablet Oral Daily   multivitamin-lutein  1 capsule Oral Daily   nicotine  21 mg Transdermal Daily    pantoprazole  40 mg Oral Daily   sertraline  100 mg Oral BID   Continuous Infusions:  sodium chloride 0 mL/hr at 11/26/20 1306   piperacillin-tazobactam (ZOSYN)  IV 3.375 g (12/01/20 1400)     LOS: 6 days    Time spent: 27 minutes    Sharen Hones, MD [facility]   To contact the attending provider between 7A-7P or the covering provider during after hours 7P-7A, please log into the web site [email or web address] and access using universal [place] password for that web site. If you do not have the password, please call the hospital operator.  12/01/2020, 2:09 PM

## 2020-12-01 NOTE — Progress Notes (Signed)
[facility] Daily Progress Note   11/25/20:             1.  Ultrasound guidance for vascular access right femoral artery             2.  Catheter placement into left common femoral artery from right femoral approach             3.  Aortogram and selective left lower extremity angiogram             4.  Percutaneous transluminal angioplasty of left peroneal artery and tibioperoneal trunk with 2.5 mm diameter angioplasty balloon             5.  Percutaneous transluminal angioplasty of the left SFA and popliteal arteries with a pair of 4 mm diameter Lutonix drug-coated angioplasty balloon             6.  Viabahn stent placement x2 to the left SFA and popliteal arteries with 6 mm diameter by 15 cm length stent and 6 mm diameter by 25 cm length stent             7.  StarClose closure device right femoral artery  11/30/20:             1.  Ultrasound guidance for vascular access right femoral artery             2.  Catheter placement into left common femoral artery from right femoral approach             3.  Selective left lower extremity angiogram             4.  Percutaneous transluminal angioplasty of left anterior tibial artery with 2 mm diameter angioplasty balloon             5.  Viabahn stent placement to the left popliteal artery with 5 mm diameter by 5 cm length stent             6.  Viabahn stent placement to the left proximal SFA with 6 mm diameter by 5 cm length stent             7.  StarClose closure device right femoral artery  Subjective: Patient laying in bed. Husband at bedside. NO acute issues overnight.  Objective: Vitals:   11/30/20 1614 11/30/20 2039 12/01/20 0419 12/01/20 0806  BP: 133/68 (!) 92/55 (!) 154/72 (!) 142/64  Pulse: 87 93 83 81  Resp: '20 20 16 20  '$ Temp:  98 F (36.7 C) 97.8 F (36.6 C) 97.8 F (36.6 C)  TempSrc:    Oral  SpO2: 93% 94% 95% 98%  Weight:      Height:        Intake/Output Summary (Last 24 hours) at 12/01/2020 1212 Last  data filed at 12/01/2020 0900 Gross per 24 hour  Intake 1160 ml  Output --  Net 1160 ml   Physical Exam: A&Ox1, NAD CV: RRR Pulmonary: CTA Bilaterally Abdomen: Soft, Nontender, Nondistended Right Groin:  PAD in place. Clean and dry.  Vascular:  LLE: Thigh soft. Calf soft. Dressing intact clean and dry. Toes warm.    Laboratory: CBC    Component Value Date/Time   WBC 10.7 (H) 12/01/2020 0459   HGB 9.2 (L) 12/01/2020 0459   HGB 12.1 03/27/2014 1730   HCT 30.4 (L) 12/01/2020 0459   HCT 37.0 03/27/2014 1730   PLT 266 12/01/2020 0459   PLT 175 03/27/2014 1730  BMET    Component Value Date/Time   NA 133 (L) 11/28/2020 0432   NA 140 03/28/2014 0441   K 3.8 11/28/2020 0432   K 4.0 03/28/2014 0441   CL 101 11/28/2020 0432   CL 110 (H) 03/28/2014 0441   CO2 28 11/28/2020 0432   CO2 24 03/28/2014 0441   GLUCOSE 148 (H) 11/28/2020 0432   GLUCOSE 86 03/28/2014 0441   BUN 15 11/28/2020 0432   BUN 19 (H) 03/28/2014 0441   CREATININE 0.63 11/28/2020 0432   CREATININE 0.79 11/24/2015 0908   CALCIUM 9.1 11/28/2020 0432   CALCIUM 8.7 03/28/2014 0441   GFRNONAA >60 11/28/2020 0432   GFRNONAA 76 11/24/2015 0908   GFRAA >60 07/28/2017 0507   GFRAA 87 11/24/2015 0908   Assessment/Planning: Patient is a 75 year old female with ulceration and gangrenous changes to the left foot s/p angiogram x2  1) OK to remove PAD 2) Angiogram x2 - now with AT and peroneal patent. 3) On statin and ASA (not reordered yet) and plavix for medical management. 4) No further recommendations at this time from vascular surgery.  5) Counseled patient again to stop smoking.  Discussed with Dr. Ellis Parents Shoshannah Faubert PA-C 12/01/2020 12:12 PM

## 2020-12-01 NOTE — Progress Notes (Signed)
PT Cancellation Note  Patient Details Name: Michele Matthews MRN: [id number] DOB: Oct 01, 1945   Cancelled Treatment:    Reason Eval/Treat Not Completed: Other (comment).  PT making PM attempt with pt still not having required post op shoe delivered to unit. Pt encounter states planned L transmetatarsal amputation scheduled for tomorrow.  PT will follow up tomorrow following surgical procedure to see if appropriate for therapy.   Gwenlyn Saran, PT, DPT 12/01/20, 4:51 PM

## 2020-12-02 ENCOUNTER — Inpatient Hospital Stay: Payer: Medicare HMO | Admitting: Anesthesiology

## 2020-12-02 ENCOUNTER — Inpatient Hospital Stay: Payer: Medicare HMO

## 2020-12-02 ENCOUNTER — Encounter
Admission: EM | Disposition: A | Discharge status: Skilled Nursing Facility | Payer: Self-pay | Source: Home / Self Care | Attending: Family Medicine

## 2020-12-02 DIAGNOSIS — L089 Local infection of the skin and subcutaneous tissue, unspecified: Secondary | ICD-10-CM | POA: Diagnosis not present

## 2020-12-02 HISTORY — PX: TRANSMETATARSAL AMPUTATION: SHX6197

## 2020-12-02 LAB — BASIC METABOLIC PANEL
Anion gap: 9 (ref 5–15)
BUN: 33 mg/dL — ABNORMAL HIGH (ref 8–23)
CO2: 29 mmol/L (ref 22–32)
Calcium: 9.5 mg/dL (ref 8.9–10.3)
Chloride: 100 mmol/L (ref 98–111)
Creatinine, Ser: 0.95 mg/dL (ref 0.44–1.00)
GFR, Estimated: >60 mL/min (ref >60)
Glucose, Bld: 86 mg/dL (ref 70–99)
Potassium: 4.4 mmol/L (ref 3.5–5.1)
Sodium: 138 mmol/L (ref 135–145)

## 2020-12-02 LAB — CBC WITH DIFFERENTIAL/PLATELET
Abs Immature Granulocytes: 0.04 10*3/uL (ref 0.00–0.07)
Basophils Absolute: 0 10*3/uL (ref 0.0–0.1)
Basophils Relative: 0 %
Eosinophils Absolute: 0.2 10*3/uL (ref 0.0–0.5)
Eosinophils Relative: 2 %
HCT: 29 % — ABNORMAL LOW (ref 36.0–46.0)
Hemoglobin: 8.6 g/dL — ABNORMAL LOW (ref 12.0–15.0)
Immature Granulocytes: 0 %
Lymphocytes Relative: 13 %
Lymphs Abs: 1.3 10*3/uL (ref 0.7–4.0)
MCH: 22.9 pg — ABNORMAL LOW (ref 26.0–34.0)
MCHC: 29.7 g/dL — ABNORMAL LOW (ref 30.0–36.0)
MCV: 77.1 fL — ABNORMAL LOW (ref 80.0–100.0)
Monocytes Absolute: 1 10*3/uL (ref 0.1–1.0)
Monocytes Relative: 10 %
Neutro Abs: 7.6 10*3/uL (ref 1.7–7.7)
Neutrophils Relative %: 75 %
Platelets: 224 10*3/uL (ref 150–400)
RBC: 3.76 MIL/uL — ABNORMAL LOW (ref 3.87–5.11)
RDW: 26.5 % — ABNORMAL HIGH (ref 11.5–15.5)
WBC: 10.2 10*3/uL (ref 4.0–10.5)
nRBC: 0 % (ref 0.0–0.2)

## 2020-12-02 LAB — GLUCOSE, CAPILLARY: Glucose-Capillary: 79 mg/dL (ref 70–99)

## 2020-12-02 LAB — MAGNESIUM: Magnesium: 2.1 mg/dL (ref 1.7–2.4)

## 2020-12-02 SURGERY — AMPUTATION, FOOT, TRANSMETATARSAL
Anesthesia: Monitor Anesthesia Care | Site: Foot | Laterality: Left

## 2020-12-02 MED ORDER — BUPIVACAINE HCL 0.5 % IJ SOLN
INTRAMUSCULAR | Status: DC | PRN
  Administered 2020-12-02: 20 mL

## 2020-12-02 MED ORDER — PROPOFOL 500 MG/50ML IV EMUL
INTRAVENOUS | Status: DC | PRN
  Administered 2020-12-02: 30 ug/kg/min via INTRAVENOUS

## 2020-12-02 MED ORDER — FENTANYL CITRATE (PF) 100 MCG/2ML IJ SOLN
INTRAMUSCULAR | Status: DC | PRN
  Administered 2020-12-02: 50 ug via INTRAVENOUS
  Administered 2020-12-02: 12.5 ug via INTRAVENOUS

## 2020-12-02 MED ORDER — ACETAMINOPHEN 10 MG/ML IV SOLN: 1000.0000 mg | Freq: Once | INTRAVENOUS | Status: DC | PRN

## 2020-12-02 MED ORDER — LIDOCAINE HCL (PF) 1 % IJ SOLN
INTRAMUSCULAR | Status: AC
  Filled 2020-12-02: qty 30

## 2020-12-02 MED ORDER — PROPOFOL 1000 MG/100ML IV EMUL
INTRAVENOUS | Status: AC
  Filled 2020-12-02: qty 100

## 2020-12-02 MED ORDER — ONDANSETRON HCL 4 MG/2ML IJ SOLN
INTRAMUSCULAR | Status: DC | PRN
  Administered 2020-12-02: 4 mg via INTRAVENOUS

## 2020-12-02 MED ORDER — SODIUM CHLORIDE 0.9 % IV SOLN: INTRAVENOUS | Status: DC | PRN

## 2020-12-02 MED ORDER — 0.9 % SODIUM CHLORIDE (POUR BTL) OPTIME
TOPICAL | Status: DC | PRN
  Administered 2020-12-02: 500 mL

## 2020-12-02 MED ORDER — BUPIVACAINE HCL (PF) 0.5 % IJ SOLN
INTRAMUSCULAR | Status: AC
  Filled 2020-12-02: qty 30

## 2020-12-02 MED ORDER — FENTANYL CITRATE (PF) 100 MCG/2ML IJ SOLN
INTRAMUSCULAR | Status: AC
  Filled 2020-12-02: qty 2

## 2020-12-02 MED ORDER — LIDOCAINE HCL 1 % IJ SOLN
INTRAMUSCULAR | Status: DC | PRN
  Administered 2020-12-02: 20 mL

## 2020-12-02 MED ORDER — ONDANSETRON HCL 4 MG/2ML IJ SOLN: 4.0000 mg | Freq: Once | INTRAMUSCULAR | Status: DC | PRN

## 2020-12-02 MED ORDER — VANCOMYCIN HCL 1000 MG IV SOLR
INTRAVENOUS | Status: AC
  Filled 2020-12-02: qty 1000

## 2020-12-02 MED ORDER — FENTANYL CITRATE (PF) 100 MCG/2ML IJ SOLN: 25.0000 ug | INTRAMUSCULAR | Status: DC | PRN

## 2020-12-02 MED ORDER — SODIUM CHLORIDE 0.9 % IR SOLN
Status: DC | PRN
  Administered 2020-12-02: 3000 mL

## 2020-12-02 SURGICAL SUPPLY — 56 items
BAG COUNTER SPONGE SURGICOUNT (BAG) IMPLANT
BLADE OSC/SAGITTAL MD 9X18.5 (BLADE) IMPLANT
BLADE OSCILLATING/SAGITTAL (BLADE) ×1
BLADE SURG 15 STRL LF DISP TIS (BLADE) ×2 IMPLANT
BLADE SURG 15 STRL SS (BLADE) ×2
BLADE SW THK.38XMED LNG THN (BLADE) ×1 IMPLANT
BNDG COHESIVE 4X5 TAN ST LF (GAUZE/BANDAGES/DRESSINGS) IMPLANT
BNDG ELASTIC 4X5.8 VLCR STR LF (GAUZE/BANDAGES/DRESSINGS) ×2 IMPLANT
BNDG ELASTIC 6X5.8 VLCR STR LF (GAUZE/BANDAGES/DRESSINGS) IMPLANT
BNDG ESMARK 4X12 TAN STRL LF (GAUZE/BANDAGES/DRESSINGS) ×2 IMPLANT
BNDG GAUZE ELAST 4 BULKY (GAUZE/BANDAGES/DRESSINGS) ×2 IMPLANT
BNDG STRETCH 4X75 STRL LF (GAUZE/BANDAGES/DRESSINGS) ×2 IMPLANT
CUFF TOURN SGL QUICK 12 (TOURNIQUET CUFF) ×2 IMPLANT
CUFF TOURN SGL QUICK 18X4 (TOURNIQUET CUFF) IMPLANT
DRAIN PENROSE 12X.25 LTX STRL (MISCELLANEOUS) IMPLANT
DRAPE FLUOR MINI C-ARM 54X84 (DRAPES) IMPLANT
DRSG MEPILEX FLEX 3X3 (GAUZE/BANDAGES/DRESSINGS) IMPLANT
DURAPREP 26ML APPLICATOR (WOUND CARE) IMPLANT
ELECT REM PT RETURN 9FT ADLT (ELECTROSURGICAL) ×2
ELECTRODE REM PT RTRN 9FT ADLT (ELECTROSURGICAL) ×1 IMPLANT
GAUZE 4X4 16PLY ~~LOC~~+RFID DBL (SPONGE) ×2 IMPLANT
GAUZE SPONGE 4X4 12PLY STRL (GAUZE/BANDAGES/DRESSINGS) ×2 IMPLANT
GAUZE XEROFORM 1X8 LF (GAUZE/BANDAGES/DRESSINGS) ×2 IMPLANT
GLOVE SURG ENC MOIS LTX SZ7 (GLOVE) ×2 IMPLANT
GLOVE SURG UNDER LTX SZ7 (GLOVE) ×2 IMPLANT
GOWN STRL REUS W/ TWL LRG LVL3 (GOWN DISPOSABLE) ×2 IMPLANT
GOWN STRL REUS W/TWL LRG LVL3 (GOWN DISPOSABLE) ×2
HANDLE YANKAUER SUCT BULB TIP (MISCELLANEOUS) IMPLANT
HANDPIECE VERSAJET DEBRIDEMENT (MISCELLANEOUS) IMPLANT
KIT TURNOVER KIT A (KITS) ×2 IMPLANT
MANIFOLD NEPTUNE II (INSTRUMENTS) ×2 IMPLANT
NDL SAFETY ECLIPSE 18X1.5 (NEEDLE) ×1 IMPLANT
NEEDLE HYPO 18GX1.5 SHARP (NEEDLE) ×1
NEEDLE HYPO 25X1 1.5 SAFETY (NEEDLE) ×4 IMPLANT
NS IRRIG 500ML POUR BTL (IV SOLUTION) ×2 IMPLANT
PACK EXTREMITY ARMC (MISCELLANEOUS) ×2 IMPLANT
PAD ABD DERMACEA PRESS 5X9 (GAUZE/BANDAGES/DRESSINGS) ×4 IMPLANT
PULSAVAC PLUS IRRIG FAN TIP (DISPOSABLE)
SOL PREP PVP 2OZ (MISCELLANEOUS) ×4
SOLUTION PREP PVP 2OZ (MISCELLANEOUS) ×2 IMPLANT
SPONGE T-LAP 18X18 ~~LOC~~+RFID (SPONGE) ×2 IMPLANT
STAPLER SKIN PROX 35W (STAPLE) ×2 IMPLANT
STOCKINETTE M/LG 89821 (MISCELLANEOUS) ×2 IMPLANT
STRIP CLOSURE SKIN 1/4X4 (GAUZE/BANDAGES/DRESSINGS) IMPLANT
SUT ETHILON 2 0 FS 18 (SUTURE) ×4 IMPLANT
SUT ETHILON 2 0 FSLX (SUTURE) IMPLANT
SUT ETHILON 3-0 FS-10 30 BLK (SUTURE) ×4
SUT VIC AB 2-0 CT1 27 (SUTURE) ×1
SUT VIC AB 2-0 CT1 TAPERPNT 27 (SUTURE) ×1 IMPLANT
SUT VIC AB 2-0 SH 27 (SUTURE)
SUT VIC AB 2-0 SH 27XBRD (SUTURE) IMPLANT
SUT VIC AB 3-0 SH 27 (SUTURE) ×3
SUT VIC AB 3-0 SH 27X BRD (SUTURE) ×3 IMPLANT
SUTURE EHLN 3-0 FS-10 30 BLK (SUTURE) ×2 IMPLANT
SYR 10ML LL (SYRINGE) ×4 IMPLANT
TIP FAN IRRIG PULSAVAC PLUS (DISPOSABLE) IMPLANT

## 2020-12-02 NOTE — Progress Notes (Signed)
PT Cancellation Note  Patient Details Name: Michele Matthews MRN: [id number] DOB: [birth year]   Cancelled Treatment:    Reason Eval/Treat Not Completed: Other (comment).  No post-op shoe in room and pt is prepping for surgery this afternoon.  Will sign off and complete orders.  Re-assess if PT orders appropriate following surgical procedure.     Gwenlyn Saran, PT, DPT 12/02/20, 1:18 PM

## 2020-12-02 NOTE — Progress Notes (Signed)
OT Cancellation Note  Patient Details Name: Michele Matthews MRN: [id number] DOB: 02-Apr-1946   Cancelled Treatment:    Reason Eval/Treat Not Completed: Pain limiting ability to participate;Other (comment). Order received and chart reviewed. Per team update at rounds, pt planned to have planned surgery this PM. Upon arrival to room pt reporting 8/10 L foot pain and no post-op shoe visible in room. Third day of attempting to mobilize pt with no shoe available, per protocol will sign off. Please re-consult after surgery as pt appropriate for OT evaluation. Thank you.  Dessie Coma, M.S. OTR/L  12/02/20, 1:21 PM  ascom [phone]

## 2020-12-02 NOTE — H&P (Signed)
HISTORY AND PHYSICAL INTERVAL NOTE:  12/02/2020  4:21 PM  Michele Matthews  has presented today for surgery, with the diagnosis of left forefoot gangrene, PVD, DM2 with polyneuropathy, osteomyelitis left forefoot .  The various methods of treatment have been discussed with the patient.  No guarantees were given.  After consideration of risks, benefits and other options for treatment, the patient has consented to surgery.  I have reviewed the patients' chart and labs.     A history and physical examination was performed in the hospital.  The patient was reexamined.  There have been no changes to this history and physical examination.  Caroline More, DPM

## 2020-12-02 NOTE — Anesthesia Preprocedure Evaluation (Addendum)
Anesthesia Evaluation  Patient identified by MRN, date of birth, ID band  Reviewed: Allergy & Precautions, NPO status , Patient's Chart, lab work & pertinent test results  Airway Mallampati: III  TM Distance: >3 FB Neck ROM: Full    Dental  (+) Poor Dentition,    Pulmonary COPD, Current Smoker and Patient abstained from smoking.,    Pulmonary exam normal        Cardiovascular Exercise Tolerance: Poor METS: < 3 Mets hypertension, Pt. on medications + CAD and +CHF (EF 25%)  Normal cardiovascular exam Rhythm:Regular Rate:Normal - Peripheral Edema    Neuro/Psych Seizures - (3 years ago),  Anxiety Depression Hemianopsia Left-sided weakness  Carotid stenosis CVA (Left sided weakness), Residual Symptoms    GI/Hepatic Neg liver ROS, GERD  Controlled and Medicated,H/O peptic ulcer in 2019 requiring transfusing   Endo/Other    Renal/GU negative Renal ROS     Musculoskeletal negative musculoskeletal ROS (+)   Abdominal Normal abdominal exam  (+)   Peds  Hematology  (+) anemia , Transfused 11/27/20   Anesthesia Other Findings Echo 2019 - Left ventricle: The cavity size was mildly dilated. Wall  thickness was increased in a pattern of mild LVH. Systolic  function was severely reduced. The estimated ejection fraction  was in the range of 25% to 30%. Diffuse hypokinesis. Regional  wall motion abnormalities cannot be excluded. Features are  consistent with a pseudonormal left ventricular filling pattern,  with concomitant abnormal relaxation and increased filling  pressure (grade 2 diastolic dysfunction). Doppler parameters are  consistent with high ventricular filling pressure.  - Mitral valve: Calcified annulus. Mildly thickened leaflets .  There was moderate regurgitation.  - Left atrium: The atrium was mildly to moderately dilated.  - Right ventricle: The cavity size was normal. Systolic function  was  mildly to moderately reduced.  - Pericardium, extracardiac: There was a left pleural effusion.   Reproductive/Obstetrics                           Anesthesia Physical Anesthesia Plan  ASA: 3  Anesthesia Plan: General   Post-op Pain Management:    Induction: Intravenous  PONV Risk Score and Plan: 1 and Treatment may vary due to age or medical condition  Airway Management Planned: Natural Airway and Nasal Cannula  Additional Equipment:   Intra-op Plan:   Post-operative Plan:   Informed Consent: I have reviewed the patients History and Physical, chart, labs and discussed the procedure including the risks, benefits and alternatives for the proposed anesthesia with the patient or authorized representative who has indicated his/her understanding and acceptance.     Dental advisory given  Plan Discussed with: CRNA and Anesthesiologist  Anesthesia Plan Comments:       Anesthesia Quick Evaluation

## 2020-12-02 NOTE — Progress Notes (Signed)
[facility] PROGRESS NOTE    Michele Matthews  [id number] DOB: [birth year] DOA: 11/25/2020 PCP: [facility]      Brief Narrative:  Michele Matthews is a 75 y.o. F with HTN, COPD, stroke, history of depression and anxiety, seizures, CAD, sCHF EF 25-30%, upper GI bleed, anemia, smoking, and COPD who presented with left foot pain and infection.  7/27 evaluated by vascular surgery, underwent left lower extremity angioplasty and stent 7/31: Wound culture growing Pseudomonas 8/1 vascular surgery repeat angioplasty and stenting 8/3 to the OR with podiatry for transmetatarsal amputation       Assessment & Plan:  Left foot ischemic ulcer with gangrene -Continue Zosyn - To the OR this afternoon with orthopedics   Iron deficiency anemia Ferritin severely low, given Venofer on 7/30 Hemoglobin stable today, attention postop - Continue iron  Chronic systolic and diastolic CHF Cerebrovascular disease, secondary prevention Coronary artery disease and peripheral vascular disease Hypertension -Continue atorvastatin, carvedilol, Plavix, lisinopril  Smoking - Continue nicotine  History of seizures No seizures here, not on AED  Depression and anxiety -Continue BuSpar, sertraline  COPD No active disease          Disposition: Status is: Inpatient  Remains inpatient appropriate because:Inpatient level of care appropriate due to severity of illness  Dispo: The patient is from: Home              Anticipated d/c is to: SNF              Patient currently is not medically stable to d/c.   Difficult to place patient No       Level of care: Med-Surg       MDM: The below labs and imaging reports were reviewed and summarized above.  Medication management as above.    DVT prophylaxis: SCDs Start: 11/25/20 1212  Code Status: FULL Family Communication:       Subjective: No chest pain, dyspnea, headache, abdominal pain.  Pain in her left  foot.  Objective: Vitals:   12/02/20 0430 12/02/20 0751 12/02/20 1457 12/02/20 1627  BP:  (!) 136/56 128/61 (!) 141/69  Pulse:  81 78 73  Resp:    16  Temp:  98 F (36.7 C) 98 F (36.7 C) 98 F (36.7 C)  TempSrc:   Oral Temporal  SpO2:  98% 98% 98%  Weight: 58.7 kg     Height:        Intake/Output Summary (Last 24 hours) at 12/02/2020 1730 Last data filed at 12/02/2020 1018 Gross per 24 hour  Intake 240 ml  Output 600 ml  Net -360 ml   Filed Weights   11/29/20 0401 11/30/20 0407 12/02/20 0430  Weight: 60.5 kg 60.6 kg 58.7 kg    Examination: General appearance:  adult female, alert and in no acute distress.   HEENT: Anicteric, conjunctiva pink, lids and lashes normal. No nasal deformity, discharge, epistaxis.  Lips moist, partially edentulous, oropharynx moist, no oral lesions, hearing diminished.   Skin: Warm and dry.  no jaundice.  No suspicious rashes or lesions.  The left foot is covered with a bandage. Cardiac: RRR, nl S1-S2, no murmurs appreciated.  Capillary refill is brisk.  No JVD, no lower extremity edema Respiratory: Normal respiratory rate and rhythm.  CTAB without rales or wheezes. Abdomen: Abdomen soft.  no TTP or guarding. No ascites, distension, hepatosplenomegaly.   MSK: No deformities or effusions. Neuro: Awake and alert.  EOMI, moves all extremities. Speech fluent.  Psych: Sensorium intact and responding to questions, attention normal. Affect normal.  Judgment and insight appear normal.    Data Reviewed: I have personally reviewed following labs and imaging studies:  CBC: Recent Labs  Lab 11/26/20 0517 11/27/20 0443 11/28/20 0432 12/01/20 0459 12/02/20 0657  WBC 10.4 12.9* 12.1* 10.7* 10.2  NEUTROABS  --   --   --  9.5* 7.6  HGB 7.4* 6.8* 8.3* 9.2* 8.6*  HCT 25.2* 23.2* 26.5* 30.4* 29.0*  MCV 67.9* 66.9* 71.2* 73.8* 77.1*  PLT 310 268 228 266 [id number]   Basic Metabolic Panel: Recent Labs  Lab 11/26/20 0517 11/27/20 0443 11/28/20 0432  12/02/20 0657  NA 137 136 133* 138  K 3.4* 3.1* 3.8 4.4  CL 105 103 101 100  CO2 '26 26 28 29  '$ GLUCOSE 126* 118* 148* 86  BUN '22 13 15 '$ 33*  CREATININE 0.66 0.61 0.63 0.95  CALCIUM 9.2 8.9 9.1 9.5  MG  --   --   --  2.1   GFR: Estimated Creatinine Clearance: 45.3 mL/min (by C-G formula based on SCr of 0.95 mg/dL). Liver Function Tests: No results for input(s): AST, ALT, ALKPHOS, BILITOT, PROT, ALBUMIN in the last 168 hours. No results for input(s): LIPASE, AMYLASE in the last 168 hours. No results for input(s): AMMONIA in the last 168 hours. Coagulation Profile: No results for input(s): INR, PROTIME in the last 168 hours. Cardiac Enzymes: No results for input(s): CKTOTAL, CKMB, CKMBINDEX, TROPONINI in the last 168 hours. BNP (last 3 results) No results for input(s): PROBNP in the last 8760 hours. HbA1C: No results for input(s): HGBA1C in the last 72 hours. CBG: Recent Labs  Lab 11/28/20 0816 11/29/20 0803 11/30/20 0747 12/01/20 0947 12/02/20 0750  GLUCAP 119* 110* 115* 164* 79   Lipid Profile: No results for input(s): CHOL, HDL, LDLCALC, TRIG, CHOLHDL, LDLDIRECT in the last 72 hours. Thyroid Function Tests: No results for input(s): TSH, T4TOTAL, FREET4, T3FREE, THYROIDAB in the last 72 hours. Anemia Panel: No results for input(s): VITAMINB12, FOLATE, FERRITIN, TIBC, IRON, RETICCTPCT in the last 72 hours. Urine analysis:    Component Value Date/Time   COLORURINE COLORLESS (A) 07/24/2017 1159   APPEARANCEUR CLEAR (A) 07/24/2017 1159   APPEARANCEUR Hazy 03/27/2014 1845   LABSPEC 1.006 07/24/2017 1159   LABSPEC 1.028 03/27/2014 1845   PHURINE 7.0 07/24/2017 1159   GLUCOSEU NEGATIVE 07/24/2017 1159   GLUCOSEU Negative 03/27/2014 1845   HGBUR NEGATIVE 07/24/2017 1159   BILIRUBINUR NEGATIVE 07/24/2017 1159   BILIRUBINUR Negative 03/27/2014 1845   KETONESUR NEGATIVE 07/24/2017 1159   PROTEINUR NEGATIVE 07/24/2017 1159   NITRITE NEGATIVE 07/24/2017 1159   LEUKOCYTESUR  NEGATIVE 07/24/2017 1159   LEUKOCYTESUR 3+ 03/27/2014 1845   Sepsis Labs: '@LABRCNTIP'$ (procalcitonin:4,lacticacidven:4)  ) Recent Results (from the past 240 hour(s))  Resp Panel by RT-PCR (Flu A&B, Covid) Nasopharyngeal Swab     Status: None   Collection Time: 11/25/20 10:24 AM   Specimen: Nasopharyngeal Swab; Nasopharyngeal(NP) swabs in vial transport medium  Result Value Ref Range Status   SARS Coronavirus 2 by RT PCR NEGATIVE NEGATIVE Final    Comment: (NOTE) SARS-CoV-2 target nucleic acids are NOT DETECTED.  The SARS-CoV-2 RNA is generally detectable in upper respiratory specimens during the acute phase of infection. The lowest concentration of SARS-CoV-2 viral copies this assay can detect is 138 copies/mL. A negative result does not preclude SARS-Cov-2 infection and should not be used as the sole basis for treatment or other patient management decisions. A negative result may  occur with  improper specimen collection/handling, submission of specimen other than nasopharyngeal swab, presence of viral mutation(s) within the areas targeted by this assay, and inadequate number of viral copies(<138 copies/mL). A negative result must be combined with clinical observations, patient history, and epidemiological information. The expected result is Negative.  Fact Sheet for Patients:  EntrepreneurPulse.com.au  Fact Sheet for Healthcare Providers:  IncredibleEmployment.be  This test is no t yet approved or cleared by the [place] FDA and  has been authorized for detection and/or diagnosis of SARS-CoV-2 by FDA under an Emergency Use Authorization (EUA). This EUA will remain  in effect (meaning this test can be used) for the duration of the COVID-19 declaration under Section 564(b)(1) of the Act, 21 U.S.C.section 360bbb-3(b)(1), unless the authorization is terminated  or revoked sooner.       Influenza A by PCR NEGATIVE NEGATIVE Final    Influenza B by PCR NEGATIVE NEGATIVE Final    Comment: (NOTE) The Xpert Xpress SARS-CoV-2/FLU/RSV plus assay is intended as an aid in the diagnosis of influenza from Nasopharyngeal swab specimens and should not be used as a sole basis for treatment. Nasal washings and aspirates are unacceptable for Xpert Xpress SARS-CoV-2/FLU/RSV testing.  Fact Sheet for Patients: EntrepreneurPulse.com.au  Fact Sheet for Healthcare Providers: IncredibleEmployment.be  This test is not yet approved or cleared by the [place] FDA and has been authorized for detection and/or diagnosis of SARS-CoV-2 by FDA under an Emergency Use Authorization (EUA). This EUA will remain in effect (meaning this test can be used) for the duration of the COVID-19 declaration under Section 564(b)(1) of the Act, 21 U.S.C. section 360bbb-3(b)(1), unless the authorization is terminated or revoked.  Performed at [facility], [address] [place], [place] [address]   Aerobic/Anaerobic Culture w Gram Stain (surgical/deep wound)     Status: None   Collection Time: 11/25/20 10:24 AM   Specimen: Foot; Wound  Result Value Ref Range Status   Specimen Description   Final    FOOT LEFT Performed at [facility], [place] [address], [place], [place] [address]    Special Requests   Final    NONE Performed at [facility], [place], [place] [address]    Gram Stain   Final    RARE WBC PRESENT, PREDOMINANTLY MONONUCLEAR FEW GRAM POSITIVE COCCI FEW GRAM POSITIVE RODS GRAM NEGATIVE RODS    Culture   Final    ABUNDANT PSEUDOMONAS AERUGINOSA ABUNDANT DIPHTHEROIDS(CORYNEBACTERIUM SPECIES) Standardized susceptibility testing for this organism is not available. NO ANAEROBES ISOLATED Performed at [facility], [place] [address], [place], [place] [address]    Report Status 11/30/2020 FINAL  Final   Organism ID, Bacteria PSEUDOMONAS AERUGINOSA  Final       Susceptibility   Pseudomonas aeruginosa - MIC*    CEFTAZIDIME 4 SENSITIVE Sensitive     CIPROFLOXACIN <=0.25 SENSITIVE Sensitive     GENTAMICIN <=1 SENSITIVE Sensitive     IMIPENEM 1 SENSITIVE Sensitive     PIP/TAZO 8 SENSITIVE Sensitive     CEFEPIME 2 SENSITIVE Sensitive     * ABUNDANT PSEUDOMONAS AERUGINOSA  CULTURE, BLOOD (ROUTINE X 2) w Reflex to ID Panel     Status: None   Collection Time: 11/25/20 12:48 PM   Specimen: BLOOD  Result Value Ref Range Status   Specimen Description BLOOD LEFT ANTECUBITAL  Final   Special Requests   Final    BOTTLES DRAWN AEROBIC AND ANAEROBIC Blood Culture adequate volume  Culture   Final    NO GROWTH 5 DAYS Performed at [facility], [address] [place], [place] [address]    Report Status 11/30/2020 FINAL  Final  CULTURE, BLOOD (ROUTINE X 2) w Reflex to ID Panel     Status: None   Collection Time: 11/25/20 12:55 PM   Specimen: BLOOD  Result Value Ref Range Status   Specimen Description BLOOD BLOOD LEFT HAND  Final   Special Requests   Final    BOTTLES DRAWN AEROBIC AND ANAEROBIC Blood Culture results may not be optimal due to an inadequate volume of blood received in culture bottles   Culture   Final    NO GROWTH 5 DAYS Performed at [facility], [address], [place],  [address]    Report Status 11/30/2020 FINAL  Final         Radiology Studies: No results found.      Scheduled Meds:  [MAR Hold] vitamin C  250 mg Oral BID   [MAR Hold] atorvastatin  10 mg Oral Daily   [MAR Hold] busPIRone  7.5 mg Oral BID   [MAR Hold] carvedilol  3.125 mg Oral BID WC   chlorhexidine  60 mL Topical Once   [MAR Hold] clopidogrel  75 mg Oral Daily   [MAR Hold] feeding supplement  237 mL Oral TID BM   [MAR Hold] ferrous sulfate  325 mg Oral Q breakfast   [MAR Hold] furosemide  20 mg Oral Daily   [MAR Hold] lisinopril  5 mg Oral Daily   [MAR Hold] multivitamin with minerals  1 tablet Oral Daily   [MAR Hold]  multivitamin-lutein  1 capsule Oral Daily   [MAR Hold] nicotine  21 mg Transdermal Daily   [MAR Hold] pantoprazole  40 mg Oral Daily   [MAR Hold] senna-docusate  2 tablet Oral BID   [MAR Hold] sertraline  100 mg Oral BID   Continuous Infusions:  [MAR Hold] sodium chloride 0 mL/hr at 11/26/20 1306   [MAR Hold] piperacillin-tazobactam (ZOSYN)  IV 3.375 g (12/02/20 1406)     LOS: 7 days    Time spent: 25-minute    Edwin Dada, MD [facility] 12/02/2020, 5:30 PM     Please page though [place] or Epic secure chat:  For [employer], Adult nurse

## 2020-12-02 NOTE — Progress Notes (Signed)
Nutrition Follow Up Note   DOCUMENTATION CODES:   Severe malnutrition in context of chronic illness  INTERVENTION:   Ensure Enlive po TID, each supplement provides 350 kcal and 20 grams of protein  Magic cup TID with meals, each supplement provides 290 kcal and 9 grams of protein  Ocuvite daily for wound healing (provides zinc, vitamin A, vitamin C, Vitamin E, copper, and selenium)  Vitamin C 257m po BID   Liberalize diet   Pt at high refeed risk; recommend monitor potassium, magnesium and phosphorus labs daily until stable  NUTRITION DIAGNOSIS:   Severe Malnutrition related to chronic illness (COPD) as evidenced by severe muscle depletion, severe fat depletion.  GOAL:   Patient will meet greater than or equal to 90% of their needs -not met   MONITOR:   PO intake, Supplement acceptance, Labs, Weight trends, Skin, I & O's  ASSESSMENT:   75y.o. female with medical history significant of HTN, HLD, COPD, stroke on Coumadin, GERD, depression with anxiety, seizure, CAD, sCHF with EF 25-30&, UGIB, anemia, tobacco abuse, HOH and COPD who presents with left foot pain and left foot wound with infection now s/p endovascular intervention 7/27  Pt with fair appetite and oral intake in hospital; pt eating <60% of meals but is drinking her Ensure supplements. Pt NPO today for transmetatarsal amputation. Recommend continue supplements and vitamins. Pt is at refeed risk. Per chart, pt appears weight stable since admit.    Medications reviewed and include: vitamin C, plavix, ferrous sulfate, lasix, MVI, ocuvite, nicotine, protonix, zosyn   Labs reviewed: K 4.4 wnl, BUN 33(H) Hgb 8.6(L), Hct 29.0(L), MCV 77.1(L), MCH 22.9(L), MCHC 29.7(L)  Diet Order:   Diet Order             Diet Heart Room service appropriate? Yes with Assist; Fluid consistency: Thin  Diet effective now                  EDUCATION NEEDS:   Education needs have been addressed  Skin:  Skin Assessment:  Reviewed RN Assessment (critical limb ischemia with nonhealing wounds to left dorsal foot)  Last BM:  8/2- type 6  Height:   Ht Readings from Last 1 Encounters:  11/25/20 5' 5" (1.651 m)    Weight:   Wt Readings from Last 1 Encounters:  12/02/20 58.7 kg    Ideal Body Weight:  56.8 kg  BMI:  Body mass index is 21.53 kg/m.  Estimated Nutritional Needs:   Kcal:  1600-1800kcal/day  Protein:  80-90g/day  Fluid:  1.4-1.6L/day  CKoleen DistanceMS, RD, LDN Please refer to [facility]at[facility]or RD and/or RD on-call/weekend/after hours pager

## 2020-12-02 NOTE — Transfer of Care (Signed)
Immediate Anesthesia Transfer of Care Note  [name]  Procedure(s) Performed: TRANSMETATARSAL AMPUTATION (Left: Foot)  Patient Location: PACU  Anesthesia Type:General  Level of Consciousness: awake and confused  Airway & Oxygen Therapy: Patient Spontanous Breathing and Patient connected to face mask oxygen  Post-op Assessment: Report given to RN and Post -op Vital signs reviewed and stable  Post vital signs: Reviewed and stable  Last Vitals:  Vitals Value Taken Time  BP 144/59 12/02/20 1949  Temp    Pulse 70 12/02/20 1954  Resp 14 12/02/20 1954  SpO2 100 % 12/02/20 1954  Vitals shown include unvalidated device data.  Last Pain:  Vitals:   12/02/20 1627  TempSrc: Temporal  PainSc: 0-No pain      Patients Stated Pain Goal: 0 ([phone] [id number])  Complications: No notable events documented.

## 2020-12-02 NOTE — Anesthesia Postprocedure Evaluation (Signed)
Anesthesia Post Note  [name]  Procedure(s) Performed: Lower Extremity Angiography (Left)  Patient location during evaluation: PACU Anesthesia Type: General Level of consciousness: awake and alert Pain management: pain level controlled Vital Signs Assessment: post-procedure vital signs reviewed and stable Respiratory status: spontaneous breathing, nonlabored ventilation, respiratory function stable and patient connected to nasal cannula oxygen Cardiovascular status: blood pressure returned to baseline and stable Postop Assessment: no apparent nausea or vomiting Anesthetic complications: no   No notable events documented.   Last Vitals:  Vitals:   12/02/20 0422 12/02/20 0751  BP: (!) 108/52 (!) 136/56  Pulse: 74 81  Resp: 16   Temp: 36.6 C 36.7 C  SpO2: 99% 98%    Last Pain:  Vitals:   12/01/20 2130  TempSrc:   PainSc: Tyler Earvin Blazier

## 2020-12-02 NOTE — Anesthesia Procedure Notes (Signed)
Procedure Name: LMA Insertion Date/Time: 12/02/2020 6:31 PM Performed by: Nelda Marseille, CRNA Pre-anesthesia Checklist: Patient identified, Patient being monitored, Timeout performed, Emergency Drugs available and Suction available Patient Re-evaluated:Patient Re-evaluated prior to induction Oxygen Delivery Method: Circle system utilized Preoxygenation: Pre-oxygenation with 100% oxygen Induction Type: IV induction Ventilation: Mask ventilation without difficulty LMA: LMA inserted LMA Size: 4.0 and 3.5 Tube type: Oral Number of attempts: 1 Placement Confirmation: positive ETCO2 and breath sounds checked- equal and bilateral Tube secured with: Tape Dental Injury: Teeth and Oropharynx as per pre-operative assessment

## 2020-12-02 NOTE — TOC Initial Note (Signed)
Transition of Care [facility]) - Initial/Assessment Note    Patient Details  Name: Michele Matthews MRN: [id number] Date of Birth: [birth year]  Transition of Care [employer]) CM/SW Contact:    Beverly Sessions, RN Phone Number: 12/02/2020, 1:48 PM  Clinical Narrative:                 Patient admitted from home with foot infection Plan for TMA tomorrow Patient HOH, husband at bedside.   Patient lives at home with husband PCP Netty Starring - husband transports to appointments Denies issues obtaining medications  Patient has RW, cane shower seat, and WC at home Currently requiring acute O2  Will need PT after surgery  Husband hopeful for home with home health and would like [facility]. If SNF recommendation he would be open to discussing it   Expected Discharge Plan: [place] Barriers to Discharge: Continued Medical Work up   Patient Goals and CMS Choice        Expected Discharge Plan and Services Expected Discharge Plan: [place]       Living arrangements for the past 2 months: Single Family Home                                      Prior Living Arrangements/Services Living arrangements for the past 2 months: Single Family Home Lives with:: Spouse Patient language and need for interpreter reviewed:: Yes Do you feel safe going back to the place where you live?: Yes      Need for Family Participation in Patient Care: Yes (Comment) Care giver support system in place?: Yes (comment) Current home services: DME Criminal Activity/Legal Involvement Pertinent to Current Situation/Hospitalization: No - Comment as needed  Activities of Daily Living Home Assistive Devices/Equipment: Environmental consultant (specify type), Shower chair with back, Cane (specify quad or straight), Wheelchair ADL Screening (condition at time of admission) Patient's cognitive ability adequate to safely complete daily activities?: No (husband assists) Is the patient deaf or have  difficulty hearing?: Yes (need to talk loud) Does the patient have difficulty seeing, even when wearing glasses/contacts?: No Does the patient have difficulty concentrating, remembering, or making decisions?: No Patient able to express need for assistance with ADLs?: Yes Does the patient have difficulty dressing or bathing?: Yes (husband assists) Independently performs ADLs?: No Communication: Independent Dressing (OT): Independent Grooming: Needs assistance Is this a change from baseline?: Pre-admission baseline Feeding: Independent Bathing: Needs assistance Is this a change from baseline?: Pre-admission baseline Toileting: Independent In/Out Bed: Independent Walks in Home: Needs assistance (with walker) Is this a change from baseline?: Pre-admission baseline Does the patient have difficulty walking or climbing stairs?: Yes Weakness of Legs: Left Weakness of Arms/Hands: Left  Permission Sought/Granted                  Emotional Assessment       Orientation: : Oriented to Self, Oriented to Place, Oriented to Situation Alcohol / Substance Use: Not Applicable Psych Involvement: No (comment)  Admission diagnosis:  Cellulitis of left foot [L03.116] Left foot infection [L08.9] Patient Active Problem List   Diagnosis Date Noted   Hypokalemia 11/28/2020   Hyponatremia 11/28/2020   Left foot infection 11/25/2020   Chronic combined systolic and diastolic CHF (congestive heart failure) ([place]) 07/24/2017   Severe protein-calorie malnutrition ([place]) 04/01/2016   Anemia, iron deficiency    Pressure ulcer 12/27/2015   Essential hypertension 12/27/2015  Anemia 10/26/2015   COPD (chronic obstructive pulmonary disease) ([place]) 06/19/2015   Coronary artery disease 06/19/2015   GERD (gastroesophageal reflux disease) 06/19/2015   Hyperlipidemia, unspecified 06/19/2015   Long term current use of anticoagulant therapy 06/19/2015   Anxiety and depression 06/19/2015   Symptomatic anemia  12/10/2014   Upper GI bleed    History of CVA with residual deficit 12/10/2013   Hemianopsia 12/10/2013   Left-sided weakness 12/10/2013   Tobacco abuse 12/10/2013   PCP:  [facility]:   [place], [place] [address] Phone: [phone] Fax: [facility] - [place], [place] [address] Phone: [phone] Fax: [place], [place] - [place] [address] [place] [address] Phone: [phone] Fax: [phone]     Social Determinants of Health (SDOH) Interventions    Readmission Risk Interventions No flowsheet data found.

## 2020-12-02 NOTE — Op Note (Signed)
PODIATRY / FOOT AND ANKLE SURGERY OPERATIVE REPORT    SURGEON: Caroline More, DPM  PRE-OPERATIVE DIAGNOSIS:  1.  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  Cellulitis left foot 3.  PVD, severe, limb ischemia status post revascularization attempts x2  POST-OPERATIVE DIAGNOSIS: Same  PROCEDURE(S): Left foot transmetatarsal amputation  HEMOSTASIS: Left ankle tourniquet  ANESTHESIA: MAC  ESTIMATED BLOOD LOSS: 50 cc  FINDING(S): 1.  Ischemia noted to the dorsal wounds that probes to bone at metatarsal phalangeal joints 3 and 4 and first 2.  Bleeding associated to the dorsal aspect of the foot with procedure but did not note much bleeding to the plantar flap and capillary fill time appeared to be slightly delayed to this area with closure.  PATHOLOGY/SPECIMEN(S): Left forefoot transmetatarsal amputation path specimen  INDICATIONS:   Michele Matthews is a 75 y.o. female who presents with nonhealing ulcerations to the dorsal aspect of the left foot that been present for several months.  Patient was admitted to the hospital due to concerns for clinical limb ischemia and infection present.  Patient has had revascularization attempts performed by vascular surgery which have successfully restored blood flow to the dorsal aspect of the foot including restoring the anterior tibial as well as dorsalis pedis and peroneal arteries.  Unfortunately the posterior tibial artery still did not have flow after vascular procedures.  Discussed all treatment options with patient and patient's family both conservative and surgical attempts at correction.  Discussed with family that there are no guarantees given for procedure.  Discussed that due to the limb ischemia present the patient may not potentially heal both wounds and the amputation.  Discussed with vascular that the best chance would likely be to perform a transmetatarsal  amputation in hopes that this would heal.  If this does not heal, patient may ultimately end up with a more proximal amputation.  Once again all treatment options were discussed with patient and patient's family both conservative and surgical attempts at correction including potential risks and complications and at this time they have elected for surgical procedure today consisting of left transmetatarsal amputation.  Once again no guarantees given.  DESCRIPTION: After obtaining full informed written consent, the patient was brought back to the operating room and placed supine upon the operating table.  The patient received IV antibiotics prior to induction.  After obtaining adequate anesthesia, the patient was prepped and draped in the standard fashion.  20 cc of 1% lidocaine plain was injected about the left ankle and a left ankle type block.  Attention was then directed to the left foot where a fishmouth type of incision was made encompassing the gangrenous changes to the dorsal aspect of the foot and the incision was made just proximal to this area dorsally creating a larger plantar flap and dorsal flap.  The incision was made straight to bone.  The patient had a fair amount of bleeding to the dorsal aspect of the foot so the Esmarch bandage was used to exsanguinate the left lower extremity and the pneumatic ankle tourniquet was inflated briefly for hemostasis control.  Hemostasis was achieved with electrocauterization.  Further dissection was continued down to bone.  At this time metatarsal phalangeal joints 1 through 5 were identified and the subsequent capsular and ligamentous attachments were transected.  Digits 1 through 5 were then disarticulated and passed off in the operative site.  Circumferential dissection was then performed around metatarsal heads 1 through  5 to the midshaft portion of the metatarsals.  The fourth metatarsal and third metatarsal appeared to have changes consistent with likely  osteomyelitis as the bones appear to be softer in these areas and had abnormal contours overall.  No purulence was able to be expressed from this area although it appeared to be superficial within the tissues.  At this time a sagittal bone saw was then used to create osteotomies through metatarsals 1 through 5 with the appropriate beveling at the midshaft portion of the metatarsals.  This was performed without incident.  Metatarsals 1 through 5 were then disarticulated and passed off in the operative site.  Plantar plates as well as the sesamoids underneath the first metatarsal phalangeal joint were resected and passed off in the operative site.  The bones and plantar plates and sesamoids were passed off the operative site and sent office pathology.  The surgical site was flushed with copious amounts normal sterile saline.  The flaps were then contoured slightly further for appropriate closure.  The pneumatic ankle tourniquet was deflated and a prompt hyperemic response was noted to the dorsal foot but the plantar flap appeared to have delayed capillary fill time.  Deep closure was then obtained with a combination of 2-0 and 3-0 Vicryl.  Subcutaneous closure was then obtained with 3-0 Vicryl.  Skin closure was then obtained with 3-0 nylon and skin staples.  The patient tolerated the procedure and anesthesia well was transferred to recovery room vital signs stable vascular status remaining questionable to left lower extremity.  The patient will be discharged back to the inpatient room with the appropriate orders and follow-up instructions.    Believe that all infection was removed with amputation, patient can transition to oral antibiotics based on previous culture results.  Patient is to remain nonweightbearing to left lower extremity.  We will see the patient tomorrow at some point for a dressing change to examine the procedure site in hopes that the capillary fill time is intact to dorsal and plantar flaps.   Once again the flaps appear to be somewhat questionable especially the plantar flap and this corresponds with what vascular is found with decreased flow/very limited flow to the posterior tibial artery.  Patient is still a high risk for limb loss.    COMPLICATIONS: None  CONDITION: Good, stable  Caroline More, DPM

## 2020-12-03 ENCOUNTER — Encounter: Payer: Self-pay | Admitting: Podiatry

## 2020-12-03 LAB — BASIC METABOLIC PANEL
Anion gap: 10 (ref 5–15)
BUN: 29 mg/dL — ABNORMAL HIGH (ref 8–23)
CO2: 32 mmol/L (ref 22–32)
Calcium: 9.3 mg/dL (ref 8.9–10.3)
Chloride: 97 mmol/L — ABNORMAL LOW (ref 98–111)
Creatinine, Ser: 0.93 mg/dL (ref 0.44–1.00)
GFR, Estimated: >60 mL/min (ref >60)
Glucose, Bld: 81 mg/dL (ref 70–99)
Potassium: 4.3 mmol/L (ref 3.5–5.1)
Sodium: 139 mmol/L (ref 135–145)

## 2020-12-03 LAB — CBC
HCT: 31.7 % — ABNORMAL LOW (ref 36.0–46.0)
Hemoglobin: 9.1 g/dL — ABNORMAL LOW (ref 12.0–15.0)
MCH: 21.9 pg — ABNORMAL LOW (ref 26.0–34.0)
MCHC: 28.7 g/dL — ABNORMAL LOW (ref 30.0–36.0)
MCV: 76.4 fL — ABNORMAL LOW (ref 80.0–100.0)
Platelets: 254 10*3/uL (ref 150–400)
RBC: 4.15 MIL/uL (ref 3.87–5.11)
RDW: 27.1 % — ABNORMAL HIGH (ref 11.5–15.5)
WBC: 10.8 10*3/uL — ABNORMAL HIGH (ref 4.0–10.5)
nRBC: 0 % (ref 0.0–0.2)

## 2020-12-03 LAB — GLUCOSE, CAPILLARY: Glucose-Capillary: 88 mg/dL (ref 70–99)

## 2020-12-03 MED ORDER — ACETAMINOPHEN 500 MG PO TABS: 1000.0000 mg | ORAL_TABLET | Freq: Three times a day (TID) | ORAL | Status: DC

## 2020-12-03 MED ORDER — LEVOFLOXACIN 500 MG PO TABS
250.0000 mg | ORAL_TABLET | ORAL | Status: DC
  Administered 2020-12-03 – 2020-12-07 (×5): 250 mg via ORAL
  Filled 2020-12-03 (×6): qty 1

## 2020-12-03 MED ORDER — OXYCODONE HCL 5 MG PO TABS
7.5000 mg | ORAL_TABLET | ORAL | Status: DC | PRN
  Administered 2020-12-03 – 2020-12-05 (×7): 7.5 mg via ORAL
  Filled 2020-12-03 (×7): qty 2

## 2020-12-03 MED ORDER — OXYCODONE HCL 5 MG PO TABS
2.5000 mg | ORAL_TABLET | Freq: Once | ORAL | Status: AC
  Administered 2020-12-03: 2.5 mg via ORAL
  Filled 2020-12-03: qty 1

## 2020-12-03 MED ORDER — ACETAMINOPHEN 500 MG PO TABS
1000.0000 mg | ORAL_TABLET | Freq: Three times a day (TID) | ORAL | Status: DC
  Administered 2020-12-03 – 2020-12-08 (×17): 1000 mg via ORAL
  Filled 2020-12-03 (×17): qty 2

## 2020-12-03 MED ORDER — OXYCODONE HCL 5 MG PO TABS
5.0000 mg | ORAL_TABLET | ORAL | Status: DC | PRN
  Administered 2020-12-03: 5 mg via ORAL
  Filled 2020-12-03: qty 1

## 2020-12-03 MED ORDER — CEPHALEXIN 500 MG PO CAPS
500.0000 mg | ORAL_CAPSULE | Freq: Four times a day (QID) | ORAL | Status: DC
  Administered 2020-12-03 – 2020-12-04 (×3): 500 mg via ORAL
  Filled 2020-12-03 (×3): qty 1

## 2020-12-03 MED ORDER — MORPHINE SULFATE (PF) 2 MG/ML IV SOLN
2.0000 mg | INTRAVENOUS | Status: AC | PRN
  Administered 2020-12-03 (×2): 2 mg via INTRAVENOUS
  Filled 2020-12-03 (×2): qty 1

## 2020-12-03 MED ORDER — MORPHINE SULFATE (PF) 2 MG/ML IV SOLN
2.0000 mg | INTRAVENOUS | Status: DC | PRN
  Administered 2020-12-03: 2 mg via INTRAVENOUS
  Filled 2020-12-03: qty 1

## 2020-12-03 NOTE — Progress Notes (Signed)
Chaplain Maggie made follow up visit at bedside. Patient's partner was present during the visit. Patient expressed she is in a lot of pain after her surgery. She asked Chaplain to come visit later in the week as she is in need of rest. Patient shared she did not sleep well last night. Chaplain expects to follow up.

## 2020-12-03 NOTE — Anesthesia Postprocedure Evaluation (Signed)
Anesthesia Post Note  [name]  Procedure(s) Performed: TRANSMETATARSAL AMPUTATION (Left: Foot)  Patient location during evaluation: PACU Anesthesia Type: General Level of consciousness: awake and alert Pain management: pain level controlled Vital Signs Assessment: post-procedure vital signs reviewed and stable Respiratory status: spontaneous breathing, nonlabored ventilation, respiratory function stable and patient connected to nasal cannula oxygen Cardiovascular status: blood pressure returned to baseline and stable Postop Assessment: no apparent nausea or vomiting Anesthetic complications: no   No notable events documented.   Last Vitals:  Vitals:   12/02/20 2117 12/03/20 0405  BP: (!) 116/56 121/61  Pulse: 79 93  Resp: 18 20  Temp: 36.6 C 36.7 C  SpO2: 95% 93%    Last Pain:  Vitals:   12/03/20 0405  TempSrc: Oral  PainSc:                  [place] Ouch

## 2020-12-03 NOTE — Progress Notes (Signed)
1 Day Post-Op   Subjective/Chief Complaint: Patient seen.  Complains of significant pain having difficulty controlling in her left foot amputation site.   Objective: Vital signs in last 24 hours: Temp:  [97 F (36.1 C)-98 F (36.7 C)] 97.7 F (36.5 C) (08/04 0719) Pulse Rate:  [73-95] 95 (08/04 0719) Resp:  [15-20] 20 (08/04 0719) BP: (109-144)/(47-69) 129/56 (08/04 0719) SpO2:  [88 %-100 %] 93 % (08/04 0719) Weight:  [58.4 kg] 58.4 kg (08/04 0319) Last BM Date: 12/01/20  Intake/Output from previous day: 08/03 0701 - 08/04 0700 In: 200 [I.V.:200] Out: -  Intake/Output this shift: No intake/output data recorded.  Bandages dry and intact.  Some moderate bleeding on the bandaging upon removal.  Incision is well coapted with viable skin edges and no sign of infection.   Lab Results:  Recent Labs    12/02/20 0657 12/03/20 0431  WBC 10.2 10.8*  HGB 8.6* 9.1*  HCT 29.0* 31.7*  PLT 224 254   BMET Recent Labs    12/02/20 0657 12/03/20 0431  NA 138 139  K 4.4 4.3  CL 100 97*  CO2 29 32  GLUCOSE 86 81  BUN 33* 29*  CREATININE 0.95 0.93  CALCIUM 9.5 9.3   PT/INR No results for input(s): LABPROT, INR in the last 72 hours. ABG No results for input(s): PHART, HCO3 in the last 72 hours.  Invalid input(s): PCO2, PO2  Studies/Results: DG Foot 2 Views Left  Result Date: 12/02/2020 CLINICAL DATA:  Postop EXAM: LEFT FOOT - 2 VIEW COMPARISON:  11/25/2020 FINDINGS: Interval transmetatarsal amputation of digits 1 through 5. Cut margins are sharp. No acute osseous abnormality IMPRESSION: Interval transmetatarsal amputation of the first through fifth digits with expected postsurgical change Electronically Signed   By: Donavan Foil M.D.   On: 12/02/2020 20:40    Anti-infectives: Anti-infectives (From admission, onward)    Start     Dose/Rate Route Frequency Ordered Stop   11/30/20 1245  ceFAZolin (ANCEF) IVPB 2g/100 mL premix        2 g 200 mL/hr over 30 Minutes  Intravenous  Once 11/30/20 1152 11/30/20 1346   11/29/20 2200  piperacillin-tazobactam (ZOSYN) IVPB 3.375 g        3.375 g 12.5 mL/hr over 240 Minutes Intravenous Every 8 hours 11/29/20 1541     11/29/20 0600  ceFEPIme (MAXIPIME) 2 g in sodium chloride 0.9 % 100 mL IVPB  Status:  Discontinued        2 g 200 mL/hr over 30 Minutes Intravenous Every 12 hours 11/29/20 0453 11/29/20 1541   11/26/20 1100  cefTRIAXone (ROCEPHIN) 1 g in sodium chloride 0.9 % 100 mL IVPB  Status:  Discontinued        1 g 200 mL/hr over 30 Minutes Intravenous Every 24 hours 11/25/20 1228 11/29/20 0452   11/26/20 0400  vancomycin (VANCOCIN) IVPB 1000 mg/200 mL premix  Status:  Discontinued        1,000 mg 200 mL/hr over 60 Minutes Intravenous Every 24 hours 11/25/20 1253 11/29/20 1542   11/26/20 0000  metroNIDAZOLE (FLAGYL) IVPB 500 mg  Status:  Discontinued        500 mg 100 mL/hr over 60 Minutes Intravenous Every 12 hours 11/25/20 1228 11/27/20 1216   11/26/20 0000  ceFAZolin (ANCEF) IVPB 2g/100 mL premix  Status:  Discontinued       Note to Pharmacy: To be given in specials   2 g 200 mL/hr over 30 Minutes Intravenous  Once 11/25/20 1452  11/25/20 1804   11/25/20 1145  metroNIDAZOLE (FLAGYL) IVPB 500 mg        500 mg 100 mL/hr over 60 Minutes Intravenous  Once 11/25/20 1134 11/25/20 1255   11/25/20 1100  vancomycin (VANCOCIN) IVPB 1000 mg/200 mL premix        1,000 mg 200 mL/hr over 60 Minutes Intravenous  Once 11/25/20 1058 11/25/20 1255   11/25/20 1100  cefTRIAXone (ROCEPHIN) 1 g in sodium chloride 0.9 % 100 mL IVPB        1 g 200 mL/hr over 30 Minutes Intravenous  Once 11/25/20 1058 11/25/20 1143       Assessment/Plan: s/p Procedure(s): TRANSMETATARSAL AMPUTATION (Left) Assessment: Stable status post transmetatarsal amputation left foot.  Plan: Betadine gauze applied to incision followed by a bulky sterile bandage with Kerlix and Ace wrap.  Patient instructed to leave this clean dry and no need to  remove or change at this point.  Recommend follow-up with Dr. Luana Shu next week for reevaluation.  Patient should be stable for discharge from podiatry standpoint  Durward Fortes 12/03/2020

## 2020-12-03 NOTE — Progress Notes (Signed)
OT Cancellation Note  Patient Details Name: Michele Matthews MRN: [id number] DOB: [birth year]   Cancelled Treatment:     Pt and caregiver declined OT evaluation on this at this time due to fatigue and pain. Education provided [id number] mobility, ADL completion. Will re-attempt as able.   Shanon Payor, OTD OTR/L  12/03/20, 1:33 PM

## 2020-12-03 NOTE — Evaluation (Addendum)
Physical Therapy Evaluation Patient Details Name: Michele Matthews MRN: [id number] DOB: [birth year] Today's Date: 12/03/2020   History of Present Illness  Patient is a 75 year old female with  left forefoot gangrene, PVD, DM2 with polyneuropathy, osteomyelitis left forefoot. Multiple previous re-vascularizations attempts.  Now s/p transmetatarsal amputation left foot.   Clinical Impression  Activity tolerance is significantly limited by pain and willingness to mobilize with PT despite having had oral pain medication. Patient restless and mildly agitated at times. She was yelling out intermittently and then appears to fall asleep with eyes closed during most of the session. Attempted to facilitate relaxation and distraction techniques for increased active participation without success. Patient able to come to partial sitting position, however declined further mobility due to pain. Educated patient and spouse on positioning of LLE with elevation on pillows for edema and pain management. However, patient prefers to have left foot lowered off the bed slightly for comfort. Patient and spouse also educated about NWB status and importance of mobility especially if the goal is to go home at discharge. At this time, SNF is recommended, although spouse wants to bring patient home. PT will continue to follow to maximize independence and decrease caregiver burden.     Follow Up Recommendations SNF;Supervision for mobility/OOB    Equipment Recommendations  None recommended by PT    Recommendations for Other Services       Precautions / Restrictions Precautions Precautions: Fall Restrictions Weight Bearing Restrictions: Yes LLE Weight Bearing: Non weight bearing      Mobility  Bed Mobility Overal bed mobility: Needs Assistance             General bed mobility comments: head of bed elevated to approximately 30 degrees. patient able to achieve long sitting position independently using railing. stand  by assistance for partial short sitting position. patient feels that dangling left foot off the bed feels better than elevating the leg. encourgaement and education provided on the importance of LLE elevation for pain management and importance of routine repositioning for skin integrity. patient refusing to progress mobility further at this time    Transfers                    Ambulation/Gait                Stairs            Wheelchair Mobility    Modified Rankin (Stroke Patients Only)       Balance                                             Pertinent Vitals/Pain Pain Assessment: Faces Faces Pain Scale: Hurts whole lot Pain Location: left foot Pain Descriptors / Indicators: Grimacing Pain Intervention(s): Repositioned;Utilized relaxation techniques    Home Living Family/patient expects to be discharged to:: Private residence Living Arrangements: Spouse/significant other Available Help at Discharge: Family;Available 24 hours/day Type of Home: House Home Access: Stairs to enter Entrance Stairs-Rails:  (one rail) Technical brewer of Steps: 3 Home Layout: One level Home Equipment: Walker - 4 wheels;Walker - 2 wheels;Tub bench;Wheelchair - manual;Cane - single point      Prior Function Level of Independence: Needs assistance   Gait / Transfers Assistance Needed: patient independent with short distance mobility/ambulation using rollator  ADL's / Homemaking Assistance Needed: spouse assist with most ADLs  Hand Dominance        Extremity/Trunk Assessment   Upper Extremity Assessment Upper Extremity Assessment: Generalized weakness    Lower Extremity Assessment Lower Extremity Assessment: Generalized weakness       Communication   Communication: No difficulties  Cognition Arousal/Alertness: Lethargic Behavior During Therapy: Restless;Agitated Overall Cognitive Status: Difficult to assess                                  General Comments: patient able to follow single step commands intermittently. yelling out in pain and then appears to be sleeping. very restless and somewhat agitated      General Comments General comments (skin integrity, edema, etc.): education provided on positionin of LLE to facilitate decreased swelling and for pain management. Sp02 91% at rest.    Exercises     Assessment/Plan    PT Assessment Patient needs continued PT services  PT Problem List Decreased strength;Decreased activity tolerance;Decreased balance;Decreased mobility;Decreased cognition;Decreased safety awareness;Pain       PT Treatment Interventions DME instruction;Gait training;Stair training;Functional mobility training;Therapeutic activities;Therapeutic exercise;Balance training;Cognitive remediation;Patient/family education;Wheelchair mobility training    PT Goals (Current goals can be found in the Care Plan section)  Acute Rehab PT Goals Patient Stated Goal: to have less pain PT Goal Formulation: With patient/family Time For Goal Achievement: 12/17/20 Potential to Achieve Goals: Fair    Frequency Min 2X/week   Barriers to discharge        Co-evaluation               AM-PAC PT "6 Clicks" Mobility  Outcome Measure Help needed turning from your back to your side while in a flat bed without using bedrails?: None Help needed moving from lying on your back to sitting on the side of a flat bed without using bedrails?: A Little Help needed moving to and from a bed to a chair (including a wheelchair)?: A Lot Help needed standing up from a chair using your arms (e.g., wheelchair or bedside chair)?: A Lot Help needed to walk in hospital room?: A Lot Help needed climbing 3-5 steps with a railing? : A Lot 6 Click Score: 15    End of Session Equipment Utilized During Treatment: Oxygen Activity Tolerance: Patient limited by pain Patient left: in bed;with call bell/phone within  reach;with bed alarm set;with family/visitor present (spouse at the bedside throughout) Nurse Communication: Mobility status PT Visit Diagnosis: Pain;Muscle weakness (generalized) (M62.81);Unsteadiness on feet (R26.81) Pain - Right/Left: Left Pain - part of body:  (foot)    Time: [id number] PT Time Calculation (min) (ACUTE ONLY): 24 min   Charges:   PT Evaluation $PT Eval Moderate Complexity: 1 Mod PT Treatments $Therapeutic Activity: 8-22 mins        Minna Merritts, PT, MPT   Percell Locus 12/03/2020, 12:23 PM

## 2020-12-03 NOTE — Progress Notes (Signed)
Michele Matthews PROGRESS NOTE    Michele Matthews  [id number] DOB: 1945/05/16 DOA: 11/25/2020 PCP: [facility]      Brief Narrative:  Michele Matthews is a 75 y.o. F with HTN, COPD, stroke, history of depression and anxiety, seizures, CAD, sCHF EF 25-30%, upper GI bleed, anemia, smoking, and COPD who presented with left foot pain and infection.  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 Luana Shu - Stop Zosyn - Transition to oral antibiotics, Levaquin to cover Pseudomonas, Keflex to cover skin flora -Schedule acetaminophen, oxycodone 7.5 every 4   Iron deficiency anemia Ferritin severely low, given Venofer on 7/30 Hemoglobin stable postop -Continue iron  Chronic systolic and diastolic CHF Cerebrovascular disease, secondary prevention Coronary artery disease and peripheral vascular disease Hypertension -Continue atorvastatin, carvedilol, Plavix, lisinopril  Smoking - Continue nicotine  History of seizures No seizures here, not on AED  Depression and anxiety -Continue BuSpar, sertraline  COPD No active disease          Disposition: Status is: Inpatient  Remains inpatient appropriate because:Inpatient level of care appropriate due to severity of illness  Dispo: The patient is from: Home              Anticipated d/c is to: SNF              Patient currently is not medically stable to d/c.   Difficult to place patient No       Level of care: Med-Surg       MDM: The below labs and imaging reports were reviewed and summarized above.  Medication management as above.    DVT prophylaxis: SCDs Start: 11/25/20 1212  Code Status:  FULL Family Communication: Husband by phone      Subjective: Pain in her left foot is severe postop.  No new chest pain, dyspnea, focal weakness, numbness.  No nausea or vomiting.  Objective: Vitals:   12/03/20 0319 12/03/20 0405 12/03/20 0719 12/03/20 1517  BP:  121/61 (!) 129/56 (!) 115/53  Pulse:  93 95 88  Resp:  '20 20 20  '$ Temp:  98 F (36.7 C) 97.7 F (36.5 C) 98.1 F (36.7 C)  TempSrc:  Oral Oral   SpO2:  93% 93% 96%  Weight: 58.4 kg     Height:        Intake/Output Summary (Last 24 hours) at 12/03/2020 1920 Last data filed at 12/03/2020 1300 Gross per 24 hour  Intake 240 ml  Output --  Net 240 ml   Filed Weights   11/30/20 0407 12/02/20 0430 12/03/20 0319  Weight: 60.6 kg 58.7 kg 58.4 kg    Examination: General appearance:  adult female, alert and in no acute distress.   HEENT: Anicteric, conjunctiva pink, lids and lashes normal. No nasal deformity, discharge, epistaxis.  Lips moist, partially edentulous, oropharynx moist, no oral lesions, hearing diminished.   Skin: Warm and dry.  no jaundice.  No suspicious rashes or lesions.  The left foot is covered with a bandage. Cardiac: RRR, nl S1-S2, no murmurs appreciated.  Capillary refill is brisk.  No JVD, no lower extremity edema Respiratory: Normal respiratory rate and rhythm.  CTAB without rales or wheezes. Abdomen: Abdomen soft.  no  TTP or guarding. No ascites, distension, hepatosplenomegaly.   MSK: No deformities or effusions. Neuro: Awake and alert.  EOMI, moves all extremities. Speech fluent.    Psych: Sensorium intact and responding to questions, attention normal. Affect normal.  Judgment and insight appear normal.    Data Reviewed: I have personally reviewed following labs and imaging studies:  CBC: Recent Labs  Lab 11/27/20 0443 11/28/20 0432 12/01/20 0459 12/02/20 0657 12/03/20 0431  WBC 12.9* 12.1* 10.7* 10.2 10.8*  NEUTROABS  --   --  9.5* 7.6  --   HGB 6.8* 8.3* 9.2* 8.6* 9.1*  HCT 23.2*  26.5* 30.4* 29.0* 31.7*  MCV 66.9* 71.2* 73.8* 77.1* 76.4*  PLT 268 228 266 224 0000000   Basic Metabolic Panel: Recent Labs  Lab 11/27/20 0443 11/28/20 0432 12/02/20 0657 12/03/20 0431  NA 136 133* 138 139  K 3.1* 3.8 4.4 4.3  CL 103 101 100 97*  CO2 '26 28 29 '$ 32  GLUCOSE 118* 148* 86 81  BUN 13 15 33* 29*  CREATININE 0.61 0.63 0.95 0.93  CALCIUM 8.9 9.1 9.5 9.3  MG  --   --  2.1  --    GFR: Estimated Creatinine Clearance: 46.3 mL/min (by C-G formula based on SCr of 0.93 mg/dL). Liver Function Tests: No results for input(s): AST, ALT, ALKPHOS, BILITOT, PROT, ALBUMIN in the last 168 hours. No results for input(s): LIPASE, AMYLASE in the last 168 hours. No results for input(s): AMMONIA in the last 168 hours. Coagulation Profile: No results for input(s): INR, PROTIME in the last 168 hours. Cardiac Enzymes: No results for input(s): CKTOTAL, CKMB, CKMBINDEX, TROPONINI in the last 168 hours. BNP (last 3 results) No results for input(s): PROBNP in the last 8760 hours. HbA1C: No results for input(s): HGBA1C in the last 72 hours. CBG: Recent Labs  Lab 11/29/20 0803 11/30/20 0747 12/01/20 0947 12/02/20 0750 12/03/20 0741  GLUCAP 110* 115* 164* 79 88   Lipid Profile: No results for input(s): CHOL, HDL, LDLCALC, TRIG, CHOLHDL, LDLDIRECT in the last 72 hours. Thyroid Function Tests: No results for input(s): TSH, T4TOTAL, FREET4, T3FREE, THYROIDAB in the last 72 hours. Anemia Panel: No results for input(s): VITAMINB12, FOLATE, FERRITIN, TIBC, IRON, RETICCTPCT in the last 72 hours. Urine analysis:    Component Value Date/Time   COLORURINE COLORLESS (A) 07/24/2017 1159   APPEARANCEUR CLEAR (A) 07/24/2017 1159   APPEARANCEUR Hazy 03/27/2014 1845   LABSPEC 1.006 07/24/2017 1159   LABSPEC 1.028 03/27/2014 1845   PHURINE 7.0 07/24/2017 1159   GLUCOSEU NEGATIVE 07/24/2017 1159   GLUCOSEU Negative 03/27/2014 1845   HGBUR NEGATIVE 07/24/2017 1159   BILIRUBINUR NEGATIVE 07/24/2017  1159   BILIRUBINUR Negative 03/27/2014 1845   KETONESUR NEGATIVE 07/24/2017 1159   PROTEINUR NEGATIVE 07/24/2017 1159   NITRITE NEGATIVE 07/24/2017 1159   LEUKOCYTESUR NEGATIVE 07/24/2017 1159   LEUKOCYTESUR 3+ 03/27/2014 1845   Sepsis Labs: '@LABRCNTIP'$ (procalcitonin:4,lacticacidven:4)  ) Recent Results (from the past 240 hour(s))  Resp Panel by RT-PCR (Flu A&B, Covid) Nasopharyngeal Swab     Status: None   Collection Time: 11/25/20 10:24 AM   Specimen: Nasopharyngeal Swab; Nasopharyngeal(NP) swabs in vial transport medium  Result Value Ref Range Status   SARS Coronavirus 2 by RT PCR NEGATIVE NEGATIVE Final    Comment: (NOTE) SARS-CoV-2 target nucleic acids are NOT DETECTED.  The SARS-CoV-2 RNA is generally detectable in upper respiratory specimens during the acute phase of infection. The lowest concentration of SARS-CoV-2 viral copies this assay can detect is 138 copies/mL.  A negative result does not preclude SARS-Cov-2 infection and should not be used as the sole basis for treatment or other patient management decisions. A negative result may occur with  improper specimen collection/handling, submission of specimen other than nasopharyngeal swab, presence of viral mutation(s) within the areas targeted by this assay, and inadequate number of viral copies(<138 copies/mL). A negative result must be combined with clinical observations, patient history, and epidemiological information. The expected result is Negative.  Fact Sheet for Patients:  EntrepreneurPulse.com.au  Fact Sheet for Healthcare Providers:  IncredibleEmployment.be  This test is no t yet approved or cleared by the [place] FDA and  has been authorized for detection and/or diagnosis of SARS-CoV-2 by FDA under an Emergency Use Authorization (EUA). This EUA will remain  in effect (meaning this test can be used) for the duration of the COVID-19 declaration under Section  564(b)(1) of the Act, 21 U.S.C.section 360bbb-3(b)(1), unless the authorization is terminated  or revoked sooner.       Influenza A by PCR NEGATIVE NEGATIVE Final   Influenza B by PCR NEGATIVE NEGATIVE Final    Comment: (NOTE) The Xpert Xpress SARS-CoV-2/FLU/RSV plus assay is intended as an aid in the diagnosis of influenza from Nasopharyngeal swab specimens and should not be used as a sole basis for treatment. Nasal washings and aspirates are unacceptable for Xpert Xpress SARS-CoV-2/FLU/RSV testing.  Fact Sheet for Patients: EntrepreneurPulse.com.au  Fact Sheet for Healthcare Providers: IncredibleEmployment.be  This test is not yet approved or cleared by the [place] FDA and has been authorized for detection and/or diagnosis of SARS-CoV-2 by FDA under an Emergency Use Authorization (EUA). This EUA will remain in effect (meaning this test can be used) for the duration of the COVID-19 declaration under Section 564(b)(1) of the Act, 21 U.S.C. section 360bbb-3(b)(1), unless the authorization is terminated or revoked.  Performed at [facility], [address] [place], [place] [address]   Aerobic/Anaerobic Culture w Gram Stain (surgical/deep wound)     Status: None   Collection Time: 11/25/20 10:24 AM   Specimen: Foot; Wound  Result Value Ref Range Status   Specimen Description   Final    FOOT LEFT Performed at [facility], [place] [address], [place], [place] [address]    Special Requests   Final    NONE Performed at [facility], [place], [place] [address]    Gram Stain   Final    RARE WBC PRESENT, PREDOMINANTLY MONONUCLEAR FEW GRAM POSITIVE COCCI FEW GRAM POSITIVE RODS GRAM NEGATIVE RODS    Culture   Final    ABUNDANT PSEUDOMONAS AERUGINOSA ABUNDANT DIPHTHEROIDS(CORYNEBACTERIUM SPECIES) Standardized susceptibility testing for this organism is not available. NO ANAEROBES  ISOLATED Performed at [facility], [place] [address], [place], [place] [address]    Report Status 11/30/2020 FINAL  Final   Organism ID, Bacteria PSEUDOMONAS AERUGINOSA  Final      Susceptibility   Pseudomonas aeruginosa - MIC*    CEFTAZIDIME 4 SENSITIVE Sensitive     CIPROFLOXACIN <=0.25 SENSITIVE Sensitive     GENTAMICIN <=1 SENSITIVE Sensitive     IMIPENEM 1 SENSITIVE Sensitive     PIP/TAZO 8 SENSITIVE Sensitive     CEFEPIME 2 SENSITIVE Sensitive     * ABUNDANT PSEUDOMONAS AERUGINOSA  CULTURE, BLOOD (ROUTINE X 2) w Reflex to ID Panel     Status: None   Collection Time: 11/25/20 12:48 PM   Specimen: BLOOD  Result Value Ref Range Status  Specimen Description BLOOD LEFT ANTECUBITAL  Final   Special Requests   Final    BOTTLES DRAWN AEROBIC AND ANAEROBIC Blood Culture adequate volume   Culture   Final    NO GROWTH 5 DAYS Performed at [facility], [address] [place], [place] [address]    Report Status 11/30/2020 FINAL  Final  CULTURE, BLOOD (ROUTINE X 2) w Reflex to ID Panel     Status: None   Collection Time: 11/25/20 12:55 PM   Specimen: BLOOD  Result Value Ref Range Status   Specimen Description BLOOD BLOOD LEFT HAND  Final   Special Requests   Final    BOTTLES DRAWN AEROBIC AND ANAEROBIC Blood Culture results may not be optimal due to an inadequate volume of blood received in culture bottles   Culture   Final    NO GROWTH 5 DAYS Performed at [facility], [address], [place], [place] [address]    Report Status 11/30/2020 FINAL  Final         Radiology Studies: DG Foot 2 Views Left  Result Date: 12/02/2020 CLINICAL DATA:  Postop EXAM: LEFT FOOT - 2 VIEW COMPARISON:  11/25/2020 FINDINGS: Interval transmetatarsal amputation of digits 1 through 5. Cut margins are sharp. No acute osseous abnormality IMPRESSION: Interval transmetatarsal amputation of the first through fifth digits with expected postsurgical change Electronically Signed    By: Donavan Foil M.D.   On: 12/02/2020 20:40        Scheduled Meds:  acetaminophen  1,000 mg Oral TID   vitamin C  250 mg Oral BID   atorvastatin  10 mg Oral Daily   busPIRone  7.5 mg Oral BID   carvedilol  3.125 mg Oral BID WC   clopidogrel  75 mg Oral Daily   feeding supplement  237 mL Oral TID BM   ferrous sulfate  325 mg Oral Q breakfast   furosemide  20 mg Oral Daily   lisinopril  5 mg Oral Daily   multivitamin with minerals  1 tablet Oral Daily   multivitamin-lutein  1 capsule Oral Daily   nicotine  21 mg Transdermal Daily   pantoprazole  40 mg Oral Daily   senna-docusate  2 tablet Oral BID   sertraline  100 mg Oral BID   Continuous Infusions:  sodium chloride 0 mL/hr at 11/26/20 1306   piperacillin-tazobactam (ZOSYN)  IV 3.375 g (12/03/20 1532)     LOS: 8 days    Time spent: 25-minute    Edwin Dada, MD [facility] 12/03/2020, 7:20 PM     Please page though [place] or Epic secure chat:  For [employer], Adult nurse

## 2020-12-03 NOTE — TOC Progression Note (Signed)
Transition of Care [facility]) - Progression Note    Patient Details  Name: Michele Matthews MRN: [id number] Date of Birth: 01-30-1946  [facility]) CM/SW Contact  Beverly Sessions, RN Phone Number: 12/03/2020, 3:18 PM  Clinical Narrative:     Voicemail left for husband to discuss SNF recommendation  Expected Discharge Plan: [place] Barriers to Discharge: Continued Medical Work up  Expected Discharge Plan and Services Expected Discharge Plan: [place] arrangements for the past 2 months: Single Family Home                                       Social Determinants of Health (SDOH) Interventions    Readmission Risk Interventions No flowsheet data found.

## 2020-12-04 LAB — GLUCOSE, CAPILLARY: Glucose-Capillary: 97 mg/dL (ref 70–99)

## 2020-12-04 NOTE — Evaluation (Signed)
Occupational Therapy Evaluation Patient Details Name: Michele Matthews MRN: [id number] DOB: 1946/01/27 Today's Date: 12/04/2020    History of Present Illness Patient is a 75 year old female with  left forefoot gangrene, PVD, DM2 with polyneuropathy, osteomyelitis left forefoot. Multiple previous re-vascularizations attempts.  Now s/p transmetatarsal amputation left foot.   Clinical Impression   Michele Matthews was seen for OT evaluation this date, seen with PT 2/2 pt's limited tolerance for mobility and anxiousness Prior to hospital admission, pt was MOD I for mobility and PRN assist for ADLs. Pt lives with husband in home c 3 STE. Pt presents to acute OT demonstrating impaired ADL performance and functional mobility 2/2 decreased activity tolerance, functional strength/ROM/balance deficits, and poor insight into deficits. Pt currently requires MAX A toileting at bed level. MAX A x2 seated grooming task - pt requires BUE support and +2 for heavy posterior lean - suspect self-limiting, pt appears anxious with EOB/mobility. Pt would benefit from skilled OT to address noted impairments and functional limitations (see below for any additional details) in order to maximize safety and independence while minimizing falls risk and caregiver burden. Upon hospital discharge, recommend STR to maximize pt safety and return to PLOF.     Follow Up Recommendations  SNF;Supervision/Assistance - 24 hour    Equipment Recommendations  Other (comment) (TBD next venue of care)    Recommendations for Other Services       Precautions / Restrictions Precautions Precautions: Fall Restrictions Weight Bearing Restrictions: Yes LLE Weight Bearing: Non weight bearing      Mobility Bed Mobility Overal bed mobility: Needs Assistance Bed Mobility: Rolling;Supine to Sit;Sit to Supine Rolling: Mod assist   Supine to sit: Max assist;+2 for physical assistance;HOB elevated Sit to supine: Max assist;+2 for physical  assistance        Transfers                 General transfer comment: unable to attempt - pt refuses    Balance Overall balance assessment: Needs assistance Sitting-balance support: Bilateral upper extremity supported (RLE supported) Sitting balance-Leahy Scale: Zero                                     ADL either performed or assessed with clinical judgement   ADL Overall ADL's : Needs assistance/impaired                                       General ADL Comments: MAX A toileting at bed level. MAX A x2 seated grooming task - pt requires BUE support and +2 for heavy posterior lean - suspect self-limiting, pt appears anxious with EOB/mobility      Pertinent Vitals/Pain Pain Assessment: 0-10 Pain Score: 4  Pain Location: left foot Pain Descriptors / Indicators: Grimacing Pain Intervention(s): Limited activity within patient's tolerance;Premedicated before session;Repositioned     Hand Dominance Right   Extremity/Trunk Assessment Upper Extremity Assessment Upper Extremity Assessment: Generalized weakness   Lower Extremity Assessment Lower Extremity Assessment: Generalized weakness       Communication Communication Communication: No difficulties   Cognition Arousal/Alertness: Awake/alert Behavior During Therapy: Anxious Overall Cognitive Status: No family/caregiver present to determine baseline cognitive functioning  General Comments: patient able to follow single step commands intermittently. appears anxious with mobility attempts and unwilling/able to follow commands   General Comments       Exercises Exercises: Other exercises Other Exercises Other Exercises: Pt educated re: OT role, DME recs, d/c recs, falls prevention, ECS, HEP, importance of mobility for functinoal strengthening Other Exercises: LBD, UBD, grooming, rolling, toielting, sup<>sit, sitting balance/tolerance    Shoulder Instructions      Home Living Family/patient expects to be discharged to:: Private residence Living Arrangements: Spouse/significant other Available Help at Discharge: Family;Available 24 hours/day Type of Home: House Home Access: Stairs to enter [employer] of Steps: 3   Home Layout: One level     Bathroom Shower/Tub: Tub/shower unit         Home Equipment: Environmental consultant - 4 wheels;Walker - 2 wheels;Tub bench;Wheelchair - manual;Cane - single point          Prior Functioning/Environment Level of Independence: Needs assistance  Gait / Transfers Assistance Needed: patient independent with short distance mobility/ambulation using rollator ADL's / Homemaking Assistance Needed: spouse assist with most ADLs            OT Problem List: Decreased strength;Decreased range of motion;Decreased activity tolerance;Impaired balance (sitting and/or standing);Decreased safety awareness      OT Treatment/Interventions: Self-care/ADL training;Therapeutic exercise;Energy conservation;DME and/or AE instruction;Therapeutic activities;Balance training;Patient/family education    OT Goals(Current goals can be found in the care plan section) Acute Rehab OT Goals Patient Stated Goal: to have less pain OT Goal Formulation: With patient Time For Goal Achievement: 12/18/20 Potential to Achieve Goals: Fair ADL Goals Pt Will Perform Grooming: with modified independence;sitting;with set-up;with supervision Pt Will Perform Lower Body Dressing: with min assist;with caregiver independent in assisting;sitting/lateral leans Pt Will Transfer to Toilet: with max assist;stand pivot transfer;bedside commode (c LRAD PRN)  OT Frequency: Min 2X/week   Barriers to D/C: Inaccessible home environment             AM-PAC OT "6 Clicks" Daily Activity     Outcome Measure Help from another person eating meals?: None Help from another person taking care of personal grooming?: A Lot Help from  another person toileting, which includes using toliet, bedpan, or urinal?: A Lot Help from another person bathing (including washing, rinsing, drying)?: A Lot Help from another person to put on and taking off regular upper body clothing?: A Little Help from another person to put on and taking off regular lower body clothing?: A Lot 6 Click Score: 15   End of Session    Activity Tolerance: Patient tolerated treatment well Patient left: in bed;with call bell/phone within reach;with bed alarm set  OT Visit Diagnosis: Other abnormalities of gait and mobility (R26.89);Muscle weakness (generalized) (M62.81)                Time: [id number] OT Time Calculation (min): 25 min Charges:  OT General Charges $OT Visit: 1 Visit OT Evaluation $OT Eval Low Complexity: 1 Low OT Treatments $Self Care/Home Management : 8-22 mins  Dessie Coma, M.S. OTR/L  12/04/20, 10:57 AM  ascom [phone]

## 2020-12-04 NOTE — Discharge Instructions (Signed)
Podiatry discharge instructions: 1.  Keep dressings to left foot clean, dry, and intact until your next appointment.  If dressings become loosened or saturated then can be changed by home health or at facility.  Recommend putting Xeroform on the incision line followed by 4 x 4 gauze, ABD, Kerlix, Ace wrap with minimal compression.  If dressing is still on after 1 week after discharge then go ahead and change once weekly, if patient has an appointment sooner than this then can just be changed in clinic. 2.  Continue nonweightbearing at all times left lower extremity until incision site is completely healed.  This will likely take around 2 to 3 weeks but sometimes longer because blood flow is limited to your foot. 3.  Take antibiotics as prescribed until gone. 4.  Contact clinic if any further problems arise or for any questions.

## 2020-12-04 NOTE — TOC Progression Note (Addendum)
Transition of Care [facility]) - Progression Note    Patient Details  Name: Michele Matthews MRN: [id number] Date of Birth: 1945/08/30  Transition [facility]) CM/SW [place], LCSW Phone Number: 12/04/2020, 9:33 AM  Clinical Narrative:   Left another voicemail for patient's husband requesting return call to discuss PT recs.  2:00- Spoke to patient's husband. Patient lives with husband who provides transport. PCP is [facility]. Pharmacy is [facility] order. Patient has a RW, cane, wheelchair, shower chair and shower bench at home. Patient has had 2 COVID shots, has not had the booster. Patient and spouse are agreeable to SNF rec and prefer [facility] as she has been there in the past. CSW starting work up. Reached out to [place] at [facility] and asked her to review referral. Started auth in [place] portal. TOC will need to update with chosen SNF once patient has bed offers.  4:45- Received VM from [facility], they cannot officially approve authorization until the SNF is chosen. TOC to follow up pending bed offers.   Expected Discharge Plan: [place] Barriers to Discharge: Continued Medical Work up  Expected Discharge Plan and Services Expected Discharge Plan: [place] arrangements for the past 2 months: Single Family Home                                       Social Determinants of Health (SDOH) Interventions    Readmission Risk Interventions No flowsheet data found.

## 2020-12-04 NOTE — NC FL2 (Signed)
[place] LEVEL OF CARE SCREENING TOOL     IDENTIFICATION  Patient Name: Michele Matthews Birthdate: Dec 08, 1945 Sex: female Admission Date (Current Location): 11/25/2020  [facility] and [place] Number:  Engineering geologist and Address:  [facility], [address] [place], [place] [address]      Provider Number: [id number]  Attending Physician Name and Address:  Edwin Dada, *  Relative Name and Phone Number:       Current Level of Care: Hospital Recommended Level of Care: [place] Prior Approval Number:    Date Approved/Denied:   PASRR Number: [id number]  Discharge Plan:      Current Diagnoses: Patient Active Problem List   Diagnosis Date Noted   Hypokalemia 11/28/2020   Hyponatremia 11/28/2020   Left foot infection 11/25/2020   Chronic combined systolic and diastolic CHF (congestive heart failure) ([place]) 07/24/2017   Severe protein-calorie malnutrition ([place]) 04/01/2016   Anemia, iron deficiency    Pressure ulcer 12/27/2015   Essential hypertension 12/27/2015   Anemia 10/26/2015   COPD (chronic obstructive pulmonary disease) ([place]) 06/19/2015   Coronary artery disease 06/19/2015   GERD (gastroesophageal reflux disease) 06/19/2015   Hyperlipidemia, unspecified 06/19/2015   Long term current use of anticoagulant therapy 06/19/2015   Anxiety and depression 06/19/2015   Symptomatic anemia 12/10/2014   Upper GI bleed    History of CVA with residual deficit 12/10/2013   Hemianopsia 12/10/2013   Left-sided weakness 12/10/2013   Tobacco abuse 12/10/2013    Orientation RESPIRATION BLADDER Height & Weight     Self, Time, Situation, Place  Normal External catheter Weight: 133 lb 13.1 oz (60.7 kg) Height:  '5\' 5"'$  (165.1 cm)  BEHAVIORAL SYMPTOMS/MOOD NEUROLOGICAL BOWEL NUTRITION STATUS      Continent Diet (dys 3)  AMBULATORY STATUS COMMUNICATION OF NEEDS Skin   Extensive Assist Verbally Wound Vac (L foot)                        Personal Care Assistance Level of Assistance  Bathing, Feeding, Dressing Bathing Assistance: Maximum assistance Feeding assistance: Limited assistance Dressing Assistance: Maximum assistance     Functional Limitations Info             SPECIAL CARE FACTORS FREQUENCY  PT (By licensed PT), OT (By licensed OT)     PT Frequency: 5 x/week OT Frequency: 5 x/week            Contractures      Additional Factors Info  Code Status, Allergies Code Status Info: full Allergies Info: nka           Current Medications (12/04/2020):  This is the current hospital active medication list Current Facility-Administered Medications  Medication Dose Route Frequency Provider Last Rate Last Admin   0.9 %  sodium chloride infusion   Intravenous PRN Caroline More, DPM 0 mL/hr at 11/26/20 1306 New Bag at 11/30/20 1336   acetaminophen (TYLENOL) tablet 1,000 mg  1,000 mg Oral TID Dallie Piles, RPH   1,000 mg at 12/04/20 0936   albuterol (PROVENTIL) (2.5 MG/3ML) 0.083% nebulizer solution 3 mL  3 mL Nebulization Q4H PRN Caroline More, DPM       ascorbic acid (VITAMIN C) tablet 250 mg  250 mg Oral BID Caroline More, DPM   250 mg at 12/04/20 [id number]   atorvastatin (LIPITOR) tablet 10 mg  10 mg Oral Daily Caroline More, DPM   10 mg at 12/04/20 [phone]  busPIRone (BUSPAR) tablet 7.5 mg  7.5 mg Oral BID Caroline More, DPM   7.5 mg at 12/04/20 0942   carvedilol (COREG) tablet 3.125 mg  3.125 mg Oral BID WC Caroline More, DPM   3.125 mg at 12/04/20 [id number]   clopidogrel (PLAVIX) tablet 75 mg  75 mg Oral Daily Caroline More, DPM   75 mg at 12/04/20 [id number]   dextromethorphan-guaiFENesin (MUCINEX DM) 30-600 MG per 12 hr tablet 1 tablet  1 tablet Oral BID PRN Caroline More, DPM       diphenhydrAMINE (BENADRYL) capsule 25 mg  25 mg Oral QHS PRN Caroline More, DPM       feeding supplement (ENSURE ENLIVE / ENSURE PLUS) liquid 237 mL  237 mL Oral TID BM Caroline More, DPM   237 mL at 12/04/20 0940    ferrous sulfate tablet 325 mg  325 mg Oral Q breakfast Caroline More, DPM   325 mg at 12/04/20 [id number]   furosemide (LASIX) tablet 20 mg  20 mg Oral Daily Caroline More, DPM   20 mg at 12/04/20 0940   hydrALAZINE (APRESOLINE) injection 5 mg  5 mg Intravenous Q2H PRN Caroline More, DPM       hydrOXYzine (ATARAX/VISTARIL) tablet 10 mg  10 mg Oral BID PRN Caroline More, DPM   10 mg at 12/03/20 [id number]   levofloxacin (LEVAQUIN) tablet 250 mg  250 mg Oral Q24H Edwin Dada, MD   250 mg at 12/03/20 2241   lisinopril (ZESTRIL) tablet 5 mg  5 mg Oral Daily Caroline More, DPM   5 mg at 12/04/20 [id number]   multivitamin with minerals tablet 1 tablet  1 tablet Oral Daily Caroline More, DPM   1 tablet at 12/04/20 [id number]   multivitamin-lutein (OCUVITE-LUTEIN) capsule 1 capsule  1 capsule Oral Daily Caroline More, DPM   1 capsule at 12/04/20 1142   nicotine (NICODERM CQ - dosed in mg/24 hours) patch 21 mg  21 mg Transdermal Daily Caroline More, DPM   21 mg at 12/04/20 0941   ondansetron (ZOFRAN) injection 4 mg  4 mg Intravenous Q8H PRN Caroline More, DPM       ondansetron [facility]) injection 4 mg  4 mg Intravenous Q6H PRN Caroline More, DPM   4 mg at 12/01/20 1820   oxyCODONE (Oxy IR/ROXICODONE) immediate release tablet 7.5 mg  7.5 mg Oral Q4H PRN Edwin Dada, MD   7.5 mg at 12/04/20 1356   pantoprazole (PROTONIX) EC tablet 40 mg  40 mg Oral Daily Caroline More, DPM   40 mg at 12/04/20 [id number]   senna-docusate (Senokot-S) tablet 2 tablet  2 tablet Oral BID Caroline More, DPM   2 tablet at 12/04/20 [id number]   sertraline (ZOLOFT) tablet 100 mg  100 mg Oral BID Caroline More, DPM   100 mg at 12/04/20 [id number]   tetrahydrozoline 0.05 % ophthalmic solution 1 drop  1 drop Both Eyes BID PRN Caroline More, DPM         Discharge Medications: Please see discharge summary for a list of discharge medications.  Relevant Imaging Results:  Relevant Lab Results:   Additional Information SS #: [id number]  [place], LCSW

## 2020-12-04 NOTE — Care Management Important Message (Signed)
Important Message  Patient Details  Name: Michele Matthews MRN: [id number] Date of Birth: June 20, 1945   Medicare Important Message Given:  Yes     Juliann Pulse A Norton Bivins 12/04/2020, 3:33 PM

## 2020-12-04 NOTE — Progress Notes (Signed)
Physical Therapy Treatment Patient Details Name: Michele Matthews MRN: [id number] DOB: 1946-04-20 Today's Date: 12/04/2020    History of Present Illness Patient is a 75 year old female with  left forefoot gangrene, PVD, DM2 with polyneuropathy, osteomyelitis left forefoot. Multiple previous re-vascularizations attempts.  Now s/p transmetatarsal amputation left foot.    PT Comments    Pt tolerated treatment fair, but was overall limited secondary to increased pain levels (despite being pre-medicated) and fear avoidant behaviors. Treatment focused on improving overall tolerance to general mobility. Max encouragement required throughout session for participation. Continues to require max assist +2 for supine<>sit transfers, and mod-max assist for static seated balance at EOB. Able to perform a few BLE therex, demonstrating decreased knee ext AROM and ankle AROM indicating generalized weakness. Pt will continue to benefit from skilled acute PT services to address deficits for return to baseline function. Will continue to recommend SNF with supervision for mobility/OOB.     Follow Up Recommendations  SNF;Supervision for mobility/OOB     Equipment Recommendations  None recommended by PT    Recommendations for Other Services       Precautions / Restrictions Precautions Precautions: Fall Restrictions Weight Bearing Restrictions: Yes LLE Weight Bearing: Non weight bearing    Mobility  Bed Mobility Overal bed mobility: Needs Assistance Bed Mobility: Rolling;Supine to Sit;Sit to Supine Rolling: Mod assist   Supine to sit: Max assist;+2 for physical assistance;HOB elevated Sit to supine: Max assist;+2 for physical assistance   General bed mobility comments: max assist +2 for supine<>sit transfers with HOB elevated; increased cueing and encouragement for mobility secondary to increased pain levels.    Transfers    General transfer comment: unable to attempt - pt refuses      Balance  Overall balance assessment: Needs assistance Sitting-balance support: Bilateral upper extremity supported (LLE supported on bed, RLE holding onto railing) Sitting balance-Leahy Scale: Zero Sitting balance - Comments: posterior lean despite increased cueing for posture/UE support Postural control: Posterior lean       Cognition Arousal/Alertness: Awake/alert Behavior During Therapy: Anxious Overall Cognitive Status: No family/caregiver present to determine baseline cognitive functioning        General Comments: patient able to follow single step commands intermittently. appears anxious with mobility attempts and unwilling/able to follow commands      Exercises Total Joint Exercises Ankle Circles/Pumps: AROM;Strengthening;Both;10 reps Gluteal Sets: AROM;Strengthening;Both;10 reps Short Arc Quad: AROM;Strengthening;Both;10 reps Other Exercises Other Exercises: bed mobility; deferring transfers/further mobility. Able to complete x10 reps of ankle pumps, SAQ, and glute sets Other Exercises: Pt educated regarding: PT role/POC, DC recommendations, benefits of participation/OOB mobility, benefits of exericse, edema management/elevation of LLE        Pertinent Vitals/Pain Pain Assessment: 0-10 Pain Score: 4  Pain Location: left foot Pain Descriptors / Indicators: Grimacing Pain Intervention(s): Limited activity within patient's tolerance;Premedicated before session;Monitored during session;Repositioned     PT Goals (current goals can now be found in the care plan section) Acute Rehab PT Goals Patient Stated Goal: to have less pain PT Goal Formulation: With patient/family Time For Goal Achievement: 12/17/20 Potential to Achieve Goals: Fair Progress towards PT goals: Progressing toward goals    Frequency    Min 2X/week      PT Plan Current plan remains appropriate    Co-evaluation PT/OT/SLP Co-Evaluation/Treatment: Yes Reason for Co-Treatment: Necessary to address  cognition/behavior during functional activity;To address functional/ADL transfers PT goals addressed during session: Mobility/safety with mobility OT goals addressed during session: ADL's and self-care  AM-PAC PT "6 Clicks" Mobility   Outcome Measure  Help needed turning from your back to your side while in a flat bed without using bedrails?: None Help needed moving from lying on your back to sitting on the side of a flat bed without using bedrails?: A Little Help needed moving to and from a bed to a chair (including a wheelchair)?: A Lot Help needed standing up from a chair using your arms (e.g., wheelchair or bedside chair)?: A Lot Help needed to walk in hospital room?: Total Help needed climbing 3-5 steps with a railing? : Total 6 Click Score: 13    End of Session Equipment Utilized During Treatment: Oxygen Activity Tolerance: Patient limited by pain Patient left: in bed;with call bell/phone within reach;with bed alarm set (BLE elevated on pillows) Nurse Communication: Mobility status PT Visit Diagnosis: Pain;Muscle weakness (generalized) (M62.81);Unsteadiness on feet (R26.81) Pain - Right/Left: Left Pain - part of body:  (foot)     Time: [id number] PT Time Calculation (min) (ACUTE ONLY): 25 min  Charges:  $Therapeutic Exercise: 8-22 mins                     Herminio Commons, PT, DPT 11:48 AM,12/04/20

## 2020-12-04 NOTE — Progress Notes (Signed)
[place] [facility] PROGRESS NOTE    Michele Matthews  [id number] DOB: April 02, 1946 DOA: 11/25/2020 PCP: [place]      Brief Narrative:  [name] is a 75 y.o. F with HTN, COPD, stroke, history of depression and anxiety, seizures, CAD, sCHF EF 25-30%, upper GI bleed, anemia, smoking, and COPD who presented with left foot pain and infection.  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 Luana Shu - Continue Levaquin - Continue scheduled acetaminophen, oxycodone 7.5 every 4   Iron deficiency anemia Ferritin severely low, given Venofer on 7/30 Hemoglobin stable - Continue iron  Chronic systolic and diastolic CHF Cerebrovascular disease, secondary prevention Coronary artery disease and peripheral vascular disease Hypertension Blood pressure mostly controlled, appears euvolemic - Continue atorvastatin, carvedilol, Plavix, lisinopril   Smoking - Continue nicotine  History of seizures No seizures here, not on AED  Depression and anxiety - Continue BuSpar, sertraline  COPD No active disease          Disposition: Status is: Inpatient  Remains inpatient appropriate because:Unsafe d/c plan  Dispo: The patient is from: Home              Anticipated d/c is to: SNF              Patient currently is medically stable to d/c.   Difficult to place patient No   Patient was admitted for ischemic gangrene.  She underwent transmetatarsal amputation and orthopedic surgery thinks that her wound appears good.  They recommend as an outpatient.  Given her pain she will need substantial rehab to return to her prior level of function, safe discharge to SNF is  being sought.    Level of care: Med-Surg       MDM: The below labs and imaging reports were reviewed and summarized above.  Medication management as above.    DVT prophylaxis: SCDs Start: 11/25/20 1212  Code Status: FULL Family Communication:        Subjective: Pain in her foot is tolerable on her new pain regimen.  No new chest pain, headache, dyspnea, focal weakness, numbness.     Objective: Vitals:   12/03/20 2003 12/04/20 0455 12/04/20 0500 12/04/20 0738  BP: (!) 101/55 123/68  (!) 145/65  Pulse: 85 96  100  Resp: 17 17  (!) 24  Temp: 98 F (36.7 C) 98.1 F (36.7 C)  98.2 F (36.8 C)  TempSrc: Oral Oral  Oral  SpO2: 99% 97%  95%  Weight:   60.7 kg   Height:        Intake/Output Summary (Last 24 hours) at 12/04/2020 1425 Last data filed at 12/04/2020 1411 Gross per 24 hour  Intake 420 ml  Output --  Net 420 ml   Filed Weights   12/02/20 0430 12/03/20 0319 12/04/20 0500  Weight: 58.7 kg 58.4 kg 60.7 kg    Examination: General appearance: Thin elderly female, lying in bed, no acute distress.  Sleeping, easily arousable.     HEENT:    Skin:  Cardiac: RRR, no murmurs, no lower extremity edema. Respiratory: Normal respiratory rate and rhythm, lungs clear without rales or wheezes. Abdomen: Abdomen soft without tenderness palpation or guarding, no ascites or distention. MSK:  Diffusely decreased mildly subcutaneous muscle mass and fat.  Left foot amputated at the mid foot. Neuro: Awake and alert, extraocular movements intact, moves upper extremities with generalized weakness, speech fluent. Psych: Attention diminished, affect blunted, given somnolence from sedation oral medicines for pain.  Oriented to person, place, time.      Data Reviewed: I have personally reviewed following labs and imaging studies:  CBC: Recent Labs  Lab 11/28/20 0432 12/01/20 0459 12/02/20 0657 12/03/20 0431  WBC 12.1* 10.7* 10.2 10.8*  NEUTROABS  --  9.5* 7.6  --   HGB  8.3* 9.2* 8.6* 9.1*  HCT 26.5* 30.4* 29.0* 31.7*  MCV 71.2* 73.8* 77.1* 76.4*  PLT 228 266 224 0000000   Basic Metabolic Panel: Recent Labs  Lab 11/28/20 0432 12/02/20 0657 12/03/20 0431  NA 133* 138 139  K 3.8 4.4 4.3  CL 101 100 97*  CO2 28 29 32  GLUCOSE 148* 86 81  BUN 15 33* 29*  CREATININE 0.63 0.95 0.93  CALCIUM 9.1 9.5 9.3  MG  --  2.1  --    GFR: Estimated Creatinine Clearance: 46.3 mL/min (by C-G formula based on SCr of 0.93 mg/dL). Liver Function Tests: No results for input(s): AST, ALT, ALKPHOS, BILITOT, PROT, ALBUMIN in the last 168 hours. No results for input(s): LIPASE, AMYLASE in the last 168 hours. No results for input(s): AMMONIA in the last 168 hours. Coagulation Profile: No results for input(s): INR, PROTIME in the last 168 hours. Cardiac Enzymes: No results for input(s): CKTOTAL, CKMB, CKMBINDEX, TROPONINI in the last 168 hours. BNP (last 3 results) No results for input(s): PROBNP in the last 8760 hours. HbA1C: No results for input(s): HGBA1C in the last 72 hours. CBG: Recent Labs  Lab 11/30/20 0747 12/01/20 0947 12/02/20 0750 12/03/20 0741 12/04/20 0735  GLUCAP 115* 164* 79 88 97   Lipid Profile: No results for input(s): CHOL, HDL, LDLCALC, TRIG, CHOLHDL, LDLDIRECT in the last 72 hours. Thyroid Function Tests: No results for input(s): TSH, T4TOTAL, FREET4, T3FREE, THYROIDAB in the last 72 hours. Anemia Panel: No results for input(s): VITAMINB12, FOLATE, FERRITIN, TIBC, IRON, RETICCTPCT in the last 72 hours. Urine analysis:    Component Value Date/Time   COLORURINE COLORLESS (A) 07/24/2017 1159   APPEARANCEUR CLEAR (A) 07/24/2017 1159   APPEARANCEUR Hazy 03/27/2014 1845   LABSPEC 1.006 07/24/2017 1159   LABSPEC 1.028 03/27/2014 1845   PHURINE 7.0 07/24/2017 1159   GLUCOSEU NEGATIVE 07/24/2017 1159   GLUCOSEU Negative 03/27/2014 1845   HGBUR NEGATIVE 07/24/2017 1159   BILIRUBINUR NEGATIVE 07/24/2017 1159   BILIRUBINUR Negative 03/27/2014  1845   KETONESUR NEGATIVE 07/24/2017 1159   PROTEINUR NEGATIVE 07/24/2017 1159   NITRITE NEGATIVE 07/24/2017 1159   LEUKOCYTESUR NEGATIVE 07/24/2017 1159   LEUKOCYTESUR 3+ 03/27/2014 1845   Sepsis Labs: '@LABRCNTIP'$ (procalcitonin:4,lacticacidven:4)  ) Recent Results (from the past 240 hour(s))  Resp Panel by RT-PCR (Flu A&B, Covid) Nasopharyngeal Swab     Status: None   Collection Time: 11/25/20 10:24 AM   Specimen: Nasopharyngeal Swab; Nasopharyngeal(NP) swabs in vial transport medium  Result Value Ref Range Status   SARS Coronavirus 2 by RT PCR NEGATIVE NEGATIVE Final    Comment: (NOTE) SARS-CoV-2 target nucleic acids are NOT DETECTED.  The SARS-CoV-2 RNA is generally detectable in upper respiratory specimens during the acute phase of infection. The lowest concentration of SARS-CoV-2 viral copies this assay can detect is 138 copies/mL. A negative result does not preclude SARS-Cov-2 infection and should not be used as the sole  basis for treatment or other patient management decisions. A negative result may occur with  improper specimen collection/handling, submission of specimen other than nasopharyngeal swab, presence of viral mutation(s) within the areas targeted by this assay, and inadequate number of viral copies(<138 copies/mL). A negative result must be combined with clinical observations, patient history, and epidemiological information. The expected result is Negative.  Fact Sheet for Patients:  EntrepreneurPulse.com.au  Fact Sheet for Healthcare Providers:  IncredibleEmployment.be  This test is no t yet approved or cleared by the [place] FDA and  has been authorized for detection and/or diagnosis of SARS-CoV-2 by FDA under an Emergency Use Authorization (EUA). This EUA will remain  in effect (meaning this test can be used) for the duration of the COVID-19 declaration under Section 564(b)(1) of the Act, 21 U.S.C.section  360bbb-3(b)(1), unless the authorization is terminated  or revoked sooner.       Influenza A by PCR NEGATIVE NEGATIVE Final   Influenza B by PCR NEGATIVE NEGATIVE Final    Comment: (NOTE) The Xpert Xpress SARS-CoV-2/FLU/RSV plus assay is intended as an aid in the diagnosis of influenza from Nasopharyngeal swab specimens and should not be used as a sole basis for treatment. Nasal washings and aspirates are unacceptable for Xpert Xpress SARS-CoV-2/FLU/RSV testing.  Fact Sheet for Patients: EntrepreneurPulse.com.au  Fact Sheet for Healthcare Providers: IncredibleEmployment.be  This test is not yet approved or cleared by the [place] FDA and has been authorized for detection and/or diagnosis of SARS-CoV-2 by FDA under an Emergency Use Authorization (EUA). This EUA will remain in effect (meaning this test can be used) for the duration of the COVID-19 declaration under Section 564(b)(1) of the Act, 21 U.S.C. section 360bbb-3(b)(1), unless the authorization is terminated or revoked.  Performed at [facility], [address] [place], [place] [address]   Aerobic/Anaerobic Culture w Gram Stain (surgical/deep wound)     Status: None   Collection Time: 11/25/20 10:24 AM   Specimen: Foot; Wound  Result Value Ref Range Status   Specimen Description   Final    FOOT LEFT Performed at [facility], [place] [address], [place], [place] [address]    Special Requests   Final    NONE Performed at [facility], [place], [place] [address]    Gram Stain   Final    RARE WBC PRESENT, PREDOMINANTLY MONONUCLEAR FEW GRAM POSITIVE COCCI FEW GRAM POSITIVE RODS GRAM NEGATIVE RODS    Culture   Final    ABUNDANT PSEUDOMONAS AERUGINOSA ABUNDANT DIPHTHEROIDS(CORYNEBACTERIUM SPECIES) Standardized susceptibility testing for this organism is not available. NO ANAEROBES ISOLATED Performed at [facility], [place]  [address], [place], [place] [address]    Report Status 11/30/2020 FINAL  Final   Organism ID, Bacteria PSEUDOMONAS AERUGINOSA  Final      Susceptibility   Pseudomonas aeruginosa - MIC*    CEFTAZIDIME 4 SENSITIVE Sensitive     CIPROFLOXACIN <=0.25 SENSITIVE Sensitive     GENTAMICIN <=1 SENSITIVE Sensitive     IMIPENEM 1 SENSITIVE Sensitive     PIP/TAZO 8 SENSITIVE Sensitive     CEFEPIME 2 SENSITIVE Sensitive     * ABUNDANT PSEUDOMONAS AERUGINOSA  CULTURE, BLOOD (ROUTINE X 2) w Reflex to ID Panel     Status: None   Collection Time: 11/25/20 12:48 PM   Specimen: BLOOD  Result Value Ref Range Status   Specimen Description BLOOD LEFT ANTECUBITAL  Final   Special Requests   Final  BOTTLES DRAWN AEROBIC AND ANAEROBIC Blood Culture adequate volume   Culture   Final    NO GROWTH 5 DAYS Performed at [facility], [address] [place], [place] [address]    Report Status 11/30/2020 FINAL  Final  CULTURE, BLOOD (ROUTINE X 2) w Reflex to ID Panel     Status: None   Collection Time: 11/25/20 12:55 PM   Specimen: BLOOD  Result Value Ref Range Status   Specimen Description BLOOD BLOOD LEFT HAND  Final   Special Requests   Final    BOTTLES DRAWN AEROBIC AND ANAEROBIC Blood Culture results may not be optimal due to an inadequate volume of blood received in culture bottles   Culture   Final    NO GROWTH 5 DAYS Performed at [facility], [address] [place], [place] [address]    Report Status 11/30/2020 FINAL  Final         Radiology Studies: DG Foot 2 Views Left  Result Date: 12/02/2020 CLINICAL DATA:  Postop EXAM: LEFT FOOT - 2 VIEW COMPARISON:  11/25/2020 FINDINGS: Interval transmetatarsal amputation of digits 1 through 5. Cut margins are sharp. No acute osseous abnormality IMPRESSION: Interval transmetatarsal amputation of the first through fifth digits with expected postsurgical change Electronically Signed   By: Donavan Foil M.D.   On: 12/02/2020 20:40         Scheduled Meds:  acetaminophen  1,000 mg Oral TID   vitamin C  250 mg Oral BID   atorvastatin  10 mg Oral Daily   busPIRone  7.5 mg Oral BID   carvedilol  3.125 mg Oral BID WC   clopidogrel  75 mg Oral Daily   feeding supplement  237 mL Oral TID BM   ferrous sulfate  325 mg Oral Q breakfast   furosemide  20 mg Oral Daily   levofloxacin  250 mg Oral Q24H   lisinopril  5 mg Oral Daily   multivitamin with minerals  1 tablet Oral Daily   multivitamin-lutein  1 capsule Oral Daily   nicotine  21 mg Transdermal Daily   pantoprazole  40 mg Oral Daily   senna-docusate  2 tablet Oral BID   sertraline  100 mg Oral BID   Continuous Infusions:  sodium chloride 0 mL/hr at 11/26/20 1306     LOS: 9 days    Time spent: 25-minute    Edwin Dada, MD [facility] 12/04/2020, 2:25 PM     Please page though [place] or Epic secure chat:  For [employer], Adult nurse

## 2020-12-05 LAB — GLUCOSE, CAPILLARY: Glucose-Capillary: 106 mg/dL — ABNORMAL HIGH (ref 70–99)

## 2020-12-05 MED ORDER — OXYCODONE HCL 5 MG PO TABS
5.0000 mg | ORAL_TABLET | ORAL | Status: DC | PRN
  Administered 2020-12-05 – 2020-12-08 (×7): 5 mg via ORAL
  Filled 2020-12-05 (×7): qty 1
  Filled 2020-12-05: qty 2

## 2020-12-05 NOTE — Progress Notes (Signed)
Michele Matthews PROGRESS NOTE    WYLENE POTH  [id number] DOB: Jan 21, 1946 DOA: 11/25/2020 PCP: [facility]      Brief Narrative:  Michele Matthews is a 75 y.o. F with HTN, COPD, stroke, history of depression and anxiety, seizures, CAD, sCHF EF 25-30%, upper GI bleed, anemia, smoking, and COPD who presented with left foot pain and infection.  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 Luana Shu - Continue Levaquin, day 3 of 7 - Continue scheduled acetaminophen, oxycodone 7.5 every 4   Iron deficiency anemia Ferritin severely low, given Venofer on 7/30 -Continue iron  Systolic and diastolic CHF Cerebrovascular disease, secondary prevention Coronary artery disease and peripheral vascular disease Hypertension Pressure normal, euvolemic - Continue atorvastatin, carvedilol, Plavix, lisinopril  Smoking - Continue nicotine  History of seizures No seizures here, not on AED  Depression and anxiety - Continue BuSpar, sertraline  COPD No active disease          Disposition: Status is: Inpatient  Remains inpatient appropriate because:Unsafe d/c plan  Dispo: The patient is from: Home              Anticipated d/c is to: SNF              Patient currently is medically stable to d/c.   Difficult to place patient No   Patient was admitted for ischemic gangrene.  She underwent transmetatarsal amputation and orthopedic surgery thinks that her wound appears good.  They recommend as an outpatient.  Given her pain she will need substantial rehab to return to her prior level of function, safe discharge to SNF is being sought.    Level of care:  Med-Surg       MDM: The below labs and imaging reports were reviewed and summarized above.  Medication management as above.    DVT prophylaxis: SCDs Start: 11/25/20 1212  Code Status: FULL Family Communication: Daughter at the bedside      Subjective: Foot pain improved, she is quite sleepy.  No fever.  No respiratory distress, no vomiting.  Objective: Vitals:   12/04/20 1944 12/05/20 0520 12/05/20 1033 12/05/20 1537  BP: 122/62 (!) 139/59 113/75 131/72  Pulse: 89 100 100 85  Resp: '20 16 18 16  '$ Temp: 97.9 F (36.6 C) (!) 97.5 F (36.4 C) 98.6 F (37 C) (!) 97.5 F (36.4 C)  TempSrc: Oral Oral  Oral  SpO2: 98% 96% 100% 98%  Weight:      Height:        Intake/Output Summary (Last 24 hours) at 12/05/2020 1706 Last data filed at 12/04/2020 2010 Gross per 24 hour  Intake 240 ml  Output 800 ml  Net -560 ml   Filed Weights   12/02/20 0430 12/03/20 0319 12/04/20 0500  Weight: 58.7 kg 58.4 kg 60.7 kg    Examination: General appearance: Thin elderly female, lying in bed, no acute distress.     HEENT:    Skin:  Cardiac: RRR, no murmurs, no lower extremity edema. Respiratory: Normal respiratory rate and rhythm, lungs clear without rales or wheezes. Abdomen: Abdomen soft without tenderness palpation or guarding, no ascites or distention MSK:  Neuro: Awake and alert, moves upper extremities with generalized weakness,  speech fluent Psych: Attention diminished, affect blunted, judgment and insight appear normal      Data Reviewed: I have personally reviewed following labs and imaging studies:  CBC: Recent Labs  Lab 12/01/20 0459 12/02/20 0657 12/03/20 0431  WBC 10.7* 10.2 10.8*  NEUTROABS 9.5* 7.6  --   HGB 9.2* 8.6* 9.1*  HCT 30.4* 29.0* 31.7*  MCV 73.8* 77.1* 76.4*  PLT 266 224 0000000   Basic Metabolic Panel: Recent Labs  Lab 12/02/20 0657 12/03/20 0431  NA 138 139  K 4.4 4.3  CL 100 97*  CO2 29 32  GLUCOSE 86 81  BUN 33* 29*  CREATININE 0.95 0.93   CALCIUM 9.5 9.3  MG 2.1  --    GFR: Estimated Creatinine Clearance: 46.3 mL/min (by C-G formula based on SCr of 0.93 mg/dL). Liver Function Tests: No results for input(s): AST, ALT, ALKPHOS, BILITOT, PROT, ALBUMIN in the last 168 hours. No results for input(s): LIPASE, AMYLASE in the last 168 hours. No results for input(s): AMMONIA in the last 168 hours. Coagulation Profile: No results for input(s): INR, PROTIME in the last 168 hours. Cardiac Enzymes: No results for input(s): CKTOTAL, CKMB, CKMBINDEX, TROPONINI in the last 168 hours. BNP (last 3 results) No results for input(s): PROBNP in the last 8760 hours. HbA1C: No results for input(s): HGBA1C in the last 72 hours. CBG: Recent Labs  Lab 12/01/20 0947 12/02/20 0750 12/03/20 0741 12/04/20 0735 12/05/20 0749  GLUCAP 164* 79 88 97 106*   Lipid Profile: No results for input(s): CHOL, HDL, LDLCALC, TRIG, CHOLHDL, LDLDIRECT in the last 72 hours. Thyroid Function Tests: No results for input(s): TSH, T4TOTAL, FREET4, T3FREE, THYROIDAB in the last 72 hours. Anemia Panel: No results for input(s): VITAMINB12, FOLATE, FERRITIN, TIBC, IRON, RETICCTPCT in the last 72 hours. Urine analysis:    Component Value Date/Time   COLORURINE COLORLESS (A) 07/24/2017 1159   APPEARANCEUR CLEAR (A) 07/24/2017 1159   APPEARANCEUR Hazy 03/27/2014 1845   LABSPEC 1.006 07/24/2017 1159   LABSPEC 1.028 03/27/2014 1845   PHURINE 7.0 07/24/2017 1159   GLUCOSEU NEGATIVE 07/24/2017 1159   GLUCOSEU Negative 03/27/2014 1845   HGBUR NEGATIVE 07/24/2017 1159   BILIRUBINUR NEGATIVE 07/24/2017 1159   BILIRUBINUR Negative 03/27/2014 1845   KETONESUR NEGATIVE 07/24/2017 1159   PROTEINUR NEGATIVE 07/24/2017 1159   NITRITE NEGATIVE 07/24/2017 1159   LEUKOCYTESUR NEGATIVE 07/24/2017 1159   LEUKOCYTESUR 3+ 03/27/2014 1845   Sepsis Labs: '@LABRCNTIP'$ (procalcitonin:4,lacticacidven:4)  ) No results found for this or any previous visit (from the past 240  hour(s)).        Radiology Studies: No results found.      Scheduled Meds:  acetaminophen  1,000 mg Oral TID   vitamin C  250 mg Oral BID   atorvastatin  10 mg Oral Daily   busPIRone  7.5 mg Oral BID   carvedilol  3.125 mg Oral BID WC   clopidogrel  75 mg Oral Daily   feeding supplement  237 mL Oral TID BM   ferrous sulfate  325 mg Oral Q breakfast   furosemide  20 mg Oral Daily   levofloxacin  250 mg Oral Q24H   lisinopril  5 mg Oral Daily   multivitamin with minerals  1 tablet Oral Daily   multivitamin-lutein  1 capsule Oral Daily   nicotine  21 mg Transdermal Daily   pantoprazole  40 mg Oral Daily   senna-docusate  2 tablet Oral BID   sertraline  100 mg Oral BID  Continuous Infusions:  sodium chloride 0 mL/hr at 11/26/20 1306     LOS: 10 days    Time spent: 25-minute    Edwin Dada, MD [facility] 12/05/2020, 5:06 PM     Please page though [place] or Epic secure chat:  For [employer], Adult nurse

## 2020-12-05 NOTE — TOC Progression Note (Signed)
Transition of Care [facility]) - Progression Note    Patient Details  Name: AIZA COTLER MRN: [id number] Date of Birth: October 14, 1945  Transition of Care [facility]) CM/SW [place], LCSW Phone Number: 12/05/2020, 8:48 AM  Clinical Narrative:   Magda Paganini at [facility] can accept patient, would have a bed on Tuesday. She requested patient have the COVID booster before discharging if patient is agreeable. CSW updated [facility] portal with chosen SNF. Updated CSW assigned to this patient today.    Expected Discharge Plan: [place] Barriers to Discharge: Continued Medical Work up  Expected Discharge Plan and Services Expected Discharge Plan: [place] arrangements for the past 2 months: Single Family Home                                       Social Determinants of Health (SDOH) Interventions    Readmission Risk Interventions No flowsheet data found.

## 2020-12-06 LAB — GLUCOSE, CAPILLARY: Glucose-Capillary: 98 mg/dL (ref 70–99)

## 2020-12-06 MED ORDER — COVID-19 MRNA VACC (MODERNA) 50 MCG/0.25ML IM SUSP
0.5000 mL | Freq: Once | INTRAMUSCULAR | Status: AC
  Administered 2020-12-06: 0.5 mL via INTRAMUSCULAR
  Filled 2020-12-06: qty 0.5

## 2020-12-06 NOTE — Progress Notes (Signed)
[place] [facility] PROGRESS NOTE    PASCALE DESCHAMP  [id number] DOB: 17-Oct-1945 DOA: 11/25/2020 PCP: [place]      Brief Narrative:  Mrs. Laymance is a 75 y.o. F with HTN, COPD, stroke, history of depression and anxiety, seizures, CAD, sCHF EF 25-30%, upper GI bleed, anemia, smoking, and COPD who presented with left foot pain and infection.  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 Luana Shu - Continue Levaquin, day 4 of 7 - Continue scheduled acetaminophen, oxycodone 7.5 every 4   Iron deficiency anemia Ferritin severely low, given Venofer on 7/30 - Continue iron  Systolic and diastolic CHF Cerebrovascular disease, secondary prevention Coronary artery disease and peripheral vascular disease Hypertension Blood pressure controlled, appears euvolemic - Continue atorvastatin, carvedilol, Plavix, lisinopril  Smoking - Continue nicotine  History of seizures No seizures here, not on AED  Depression and anxiety - Continue BuSpar and sertraline  COPD No active disease          Disposition: Status is: Inpatient  Remains inpatient appropriate because:Unsafe d/c plan  Dispo: The patient is from: Home              Anticipated d/c is to: SNF              Patient currently is medically stable to d/c.   Difficult to place patient No   Patient was admitted for ischemic gangrene.  She underwent transmetatarsal amputation and orthopedic surgery thinks that her wound appears good.  They recommend as an outpatient.  Given her pain she will need substantial rehab to return to her prior level of function, safe discharge to SNF is being  sought.    Level of care: Med-Surg       MDM: The below labs and imaging reports were reviewed and summarized above.  Medication management as above.    DVT prophylaxis: SCDs Start: 11/25/20 1212  Code Status: FULL Family Communication:       Subjective: Has pain in her foot.  Appetite is good.  No fever, vomiting, respiratory distress, chest pain.     Objective: Vitals:   12/06/20 0500 12/06/20 0544 12/06/20 0820 12/06/20 1543  BP:  (!) 144/64 (!) 126/59 120/73  Pulse:  91 95 87  Resp:  16    Temp:  97.6 F (36.4 C) 97.7 F (36.5 C) 97.7 F (36.5 C)  TempSrc:    Oral  SpO2:  95% 94% 92%  Weight: 60 kg     Height:       No intake or output data in the 24 hours ending 12/06/20 1611  Filed Weights   12/03/20 0319 12/04/20 0500 12/06/20 0500  Weight: 58.4 kg 60.7 kg 60 kg    Examination: General appearance: Thin elderly female, lying in bed, no acute distress.     HEENT:    Skin:  Cardiac: Regular rate and rhythm, no murmurs, no lower extremity edema Respiratory: Normal respiratory rate and rhythm, lungs clear without rales or wheezes. Abdomen: Abdomen soft without tenderness palpation or guarding, no ascites or distention MSK: Left foot is wrapped Neuro: Awake and alert, extraocular movements intact, moves all extremities with generalized weakness but symmetric strength Psych: Attention normal, affect blunted, judgment  insight appear normal      Data Reviewed: I have personally reviewed following labs and imaging studies:  CBC: Recent Labs  Lab 12/01/20 0459 12/02/20 0657 12/03/20 0431  WBC 10.7* 10.2 10.8*  NEUTROABS 9.5* 7.6  --   HGB 9.2* 8.6* 9.1*  HCT 30.4* 29.0* 31.7*  MCV 73.8* 77.1* 76.4*  PLT 266 224 0000000   Basic Metabolic Panel: Recent Labs  Lab 12/02/20 0657 12/03/20 0431  NA 138 139  K 4.4 4.3  CL 100 97*  CO2 29 32  GLUCOSE 86 81  BUN 33* 29*  CREATININE 0.95 0.93  CALCIUM 9.5 9.3  MG 2.1  --    GFR: Estimated  Creatinine Clearance: 46.3 mL/min (by C-G formula based on SCr of 0.93 mg/dL). Liver Function Tests: No results for input(s): AST, ALT, ALKPHOS, BILITOT, PROT, ALBUMIN in the last 168 hours. No results for input(s): LIPASE, AMYLASE in the last 168 hours. No results for input(s): AMMONIA in the last 168 hours. Coagulation Profile: No results for input(s): INR, PROTIME in the last 168 hours. Cardiac Enzymes: No results for input(s): CKTOTAL, CKMB, CKMBINDEX, TROPONINI in the last 168 hours. BNP (last 3 results) No results for input(s): PROBNP in the last 8760 hours. HbA1C: No results for input(s): HGBA1C in the last 72 hours. CBG: Recent Labs  Lab 12/02/20 0750 12/03/20 0741 12/04/20 0735 12/05/20 0749 12/06/20 0819  GLUCAP 79 88 97 106* 98   Lipid Profile: No results for input(s): CHOL, HDL, LDLCALC, TRIG, CHOLHDL, LDLDIRECT in the last 72 hours. Thyroid Function Tests: No results for input(s): TSH, T4TOTAL, FREET4, T3FREE, THYROIDAB in the last 72 hours. Anemia Panel: No results for input(s): VITAMINB12, FOLATE, FERRITIN, TIBC, IRON, RETICCTPCT in the last 72 hours. Urine analysis:    Component Value Date/Time   COLORURINE COLORLESS (A) 07/24/2017 1159   APPEARANCEUR CLEAR (A) 07/24/2017 1159   APPEARANCEUR Hazy 03/27/2014 1845   LABSPEC 1.006 07/24/2017 1159   LABSPEC 1.028 03/27/2014 1845   PHURINE 7.0 07/24/2017 1159   GLUCOSEU NEGATIVE 07/24/2017 1159   GLUCOSEU Negative 03/27/2014 1845   HGBUR NEGATIVE 07/24/2017 1159   BILIRUBINUR NEGATIVE 07/24/2017 1159   BILIRUBINUR Negative 03/27/2014 1845   KETONESUR NEGATIVE 07/24/2017 1159   PROTEINUR NEGATIVE 07/24/2017 1159   NITRITE NEGATIVE 07/24/2017 1159   LEUKOCYTESUR NEGATIVE 07/24/2017 1159   LEUKOCYTESUR 3+ 03/27/2014 1845   Sepsis Labs: '@LABRCNTIP'$ (procalcitonin:4,lacticacidven:4)  ) No results found for this or any previous visit (from the past 240 hour(s)).        Radiology Studies: No results  found.      Scheduled Meds:  acetaminophen  1,000 mg Oral TID   vitamin C  250 mg Oral BID   atorvastatin  10 mg Oral Daily   busPIRone  7.5 mg Oral BID   carvedilol  3.125 mg Oral BID WC   clopidogrel  75 mg Oral Daily   COVID-19 mRNA vaccine (Moderna)  0.5 mL Intramuscular Once   feeding supplement  237 mL Oral TID BM   ferrous sulfate  325 mg Oral Q breakfast   furosemide  20 mg Oral Daily   levofloxacin  250 mg Oral Q24H   lisinopril  5 mg Oral Daily   multivitamin with minerals  1 tablet Oral Daily   multivitamin-lutein  1 capsule Oral Daily   nicotine  21 mg Transdermal Daily   pantoprazole  40 mg Oral Daily   senna-docusate  2 tablet Oral BID   sertraline  100 mg Oral BID  Continuous Infusions:  sodium chloride 0 mL/hr at 11/26/20 1306     LOS: 11 days    Time spent: 25 minutes    Edwin Dada, MD [facility] 12/06/2020, 4:11 PM     Please page though [place] or Epic secure chat:  For [employer], Adult nurse

## 2020-12-06 NOTE — TOC Progression Note (Signed)
Transition of Care [facility]) - Progression Note    Patient Details  Name: Michele Matthews MRN: [id number] Date of Birth: 17-Mar-1946  Transition of Care [facility]) CM/SW [place], LCSW Phone Number: 12/06/2020, 1:24 PM  Clinical Narrative:  Updated patient and husband. They are agreeable to COVID booster being given prior to discharge. Per husband, Wynetta Emery & Wynetta Emery vaccine was given 09/06/19 and she has not received a booster since then. Sent secure chat to MD with request.   Expected Discharge Plan: [place] Barriers to Discharge: Continued Medical Work up  Expected Discharge Plan and Services Expected Discharge Plan: [place] arrangements for the past 2 months: Single Family Home                                       Social Determinants of Health (SDOH) Interventions    Readmission Risk Interventions No flowsheet data found.

## 2020-12-06 NOTE — Plan of Care (Signed)
Pt's pain well-controlled this shift. Problem: Education: Goal: Knowledge of General Education information will improve Description: Including pain rating scale, medication(s)/side effects and non-pharmacologic comfort measures Outcome: Progressing   Problem: Health Behavior/Discharge Planning: Goal: Ability to manage health-related needs will improve Outcome: Progressing   Problem: Clinical Measurements: Goal: Ability to maintain clinical measurements within normal limits will improve Outcome: Progressing Goal: Will remain free from infection Outcome: Progressing Goal: Diagnostic test results will improve Outcome: Progressing Goal: Respiratory complications will improve Outcome: Progressing Goal: Cardiovascular complication will be avoided Outcome: Progressing   Problem: Activity: Goal: Risk for activity intolerance will decrease Outcome: Progressing   Problem: Nutrition: Goal: Adequate nutrition will be maintained Outcome: Progressing   Problem: Coping: Goal: Level of anxiety will decrease Outcome: Progressing   Problem: Elimination: Goal: Will not experience complications related to bowel motility Outcome: Progressing Goal: Will not experience complications related to urinary retention Outcome: Progressing   Problem: Pain Managment: Goal: General experience of comfort will improve Outcome: Progressing   Problem: Safety: Goal: Ability to remain free from injury will improve Outcome: Progressing   Problem: Skin Integrity: Goal: Risk for impaired skin integrity will decrease Outcome: Progressing

## 2020-12-07 LAB — GLUCOSE, CAPILLARY: Glucose-Capillary: 101 mg/dL — ABNORMAL HIGH (ref 70–99)

## 2020-12-07 LAB — SURGICAL PATHOLOGY

## 2020-12-07 NOTE — Progress Notes (Signed)
5 Days Post-Op   Subjective/Chief Complaint: Patient presents today resting in bed comfortably around 5 days after undergoing a left transmetatarsal amputation.  Patient still has a fair amount of pain to the area but notes that the pain has improved overall since prior to the procedure.  Patient presents today resting in bed comfortably with husband at bedside.   Objective: Vital signs in last 24 hours: Temp:  [97.5 F (36.4 C)-98 F (36.7 C)] 98 F (36.7 C) (08/08 0813) Pulse Rate:  [87-96] 96 (08/08 0813) Resp:  [18-20] 18 (08/08 0813) BP: (120-145)/(57-82) 140/82 (08/08 0813) SpO2:  [83 %-96 %] 83 % (08/08 0813) Weight:  [56.9 kg] 56.9 kg (08/08 0433) Last BM Date: 12/01/20  Intake/Output from previous day: No intake/output data recorded. Intake/Output this shift: No intake/output data recorded.  Status post left transmetatarsal amputation.  Incision edges appear to be well coapted with sutures and staples intact, capillary fill time appears to be intact dorsal and plantar skin flaps with no associated ecchymosis or signs of gangrenous necrosis.  Overall decreased erythema and edema present today.  Pain on palpation to the amputation site.   Lab Results:  No results for input(s): WBC, HGB, HCT, PLT in the last 72 hours.  BMET No results for input(s): NA, K, CL, CO2, GLUCOSE, BUN, CREATININE, CALCIUM in the last 72 hours.  PT/INR No results for input(s): LABPROT, INR in the last 72 hours. ABG No results for input(s): PHART, HCO3 in the last 72 hours.  Invalid input(s): PCO2, PO2  Studies/Results: No results found.  Anti-infectives: Anti-infectives (From admission, onward)    Start     Dose/Rate Route Frequency Ordered Stop   12/03/20 2200  cephALEXin (KEFLEX) capsule 500 mg  Status:  Discontinued        500 mg Oral Every 6 hours 12/03/20 1926 12/04/20 1102   12/03/20 2200  levofloxacin (LEVAQUIN) tablet 250 mg        250 mg Oral Every 24 hours 12/03/20 1926      11/30/20 1245  ceFAZolin (ANCEF) IVPB 2g/100 mL premix        2 g 200 mL/hr over 30 Minutes Intravenous  Once 11/30/20 1152 11/30/20 1346   11/29/20 2200  piperacillin-tazobactam (ZOSYN) IVPB 3.375 g  Status:  Discontinued        3.375 g 12.5 mL/hr over 240 Minutes Intravenous Every 8 hours 11/29/20 1541 12/03/20 1926   11/29/20 0600  ceFEPIme (MAXIPIME) 2 g in sodium chloride 0.9 % 100 mL IVPB  Status:  Discontinued        2 g 200 mL/hr over 30 Minutes Intravenous Every 12 hours 11/29/20 0453 11/29/20 1541   11/26/20 1100  cefTRIAXone (ROCEPHIN) 1 g in sodium chloride 0.9 % 100 mL IVPB  Status:  Discontinued        1 g 200 mL/hr over 30 Minutes Intravenous Every 24 hours 11/25/20 1228 11/29/20 0452   11/26/20 0400  vancomycin (VANCOCIN) IVPB 1000 mg/200 mL premix  Status:  Discontinued        1,000 mg 200 mL/hr over 60 Minutes Intravenous Every 24 hours 11/25/20 1253 11/29/20 1542   11/26/20 0000  metroNIDAZOLE (FLAGYL) IVPB 500 mg  Status:  Discontinued        500 mg 100 mL/hr over 60 Minutes Intravenous Every 12 hours 11/25/20 1228 11/27/20 1216   11/26/20 0000  ceFAZolin (ANCEF) IVPB 2g/100 mL premix  Status:  Discontinued       Note to Pharmacy: To be given  in specials   2 g 200 mL/hr over 30 Minutes Intravenous  Once 11/25/20 1452 11/25/20 1804   11/25/20 1145  metroNIDAZOLE (FLAGYL) IVPB 500 mg        500 mg 100 mL/hr over 60 Minutes Intravenous  Once 11/25/20 1134 11/25/20 1255   11/25/20 1100  vancomycin (VANCOCIN) IVPB 1000 mg/200 mL premix        1,000 mg 200 mL/hr over 60 Minutes Intravenous  Once 11/25/20 1058 11/25/20 1255   11/25/20 1100  cefTRIAXone (ROCEPHIN) 1 g in sodium chloride 0.9 % 100 mL IVPB        1 g 200 mL/hr over 30 Minutes Intravenous  Once 11/25/20 1058 11/25/20 1143       Assessment/Plan: s/p Procedure(s): TRANSMETATARSAL AMPUTATION (Left) Assessment: Stable status post transmetatarsal amputation left foot.  Plan:  -Patient seen and examined  today. -Incision edges once again still appear to be well coapted with sutures and staples intact, capillary fill time appears to be intact to the dorsal and plantar skin flaps with no evidence of necrosis.  Appears to be stable and healing well at this time. -Applied Xeroform to the incision line followed by 4 x 4 gauze, ABD, Kerlix, Ace wrap.  Patient may leave this dressing intact for the next week until follow-up appointment.  If patient has saturation or loosening dressing can be changed consisting of same measures that were applied today. 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  Proximal margins appear to be clear of necrotic/osteomyelitic tissue.  Believe all infection was removed with amputation.  Left foot wound culture grew gram-positive cocci and gram-negative rods -Pseudomonas, corynebacterium.  Appreciate med rec's for postoperative oral antibiotics for discharge. -Patient to continue nonweightbearing to the left foot at all times for the next 1 to 2 weeks until suture/staple removal.  Patient okay to be discharged from podiatry standpoint.  Patient to follow-up in clinic within 1 week of discharge date.  Caroline More, DPM 12/07/2020

## 2020-12-07 NOTE — Care Management Important Message (Signed)
Important Message  Patient Details  Name: ARIYA MONCRIEF MRN: [id number] Date of Birth: [birth year]   Medicare Important Message Given:  Yes     Juliann Pulse A Ival Basquez 12/07/2020, 12:42 PM

## 2020-12-07 NOTE — Progress Notes (Signed)
[place] [facility] PROGRESS NOTE    Michele Matthews  [id number] DOB: 06/16/1945 DOA: 11/25/2020 PCP: [facility] [facility]      Brief Narrative:  Michele Matthews is a 75 y.o. F with HTN, COPD, stroke, history of depression and anxiety, seizures, CAD, sCHF EF 25-30%, upper GI bleed, anemia, smoking, and COPD who presented with left foot pain and infection.  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 Luana Shu - Continue Levaquin, day 5 of 7 - Continue scheduled acetaminophen, oxycodone 7.5 every 4   Iron deficiency anemia Ferritin severely low, given Venofer on 7/30 - Continue iron  Systolic and diastolic CHF Cerebrovascular disease, secondary prevention Coronary artery disease and peripheral vascular disease Hypertension BP controlled, euvolemic - Continue atorvastatin, carvedilol, Plavix, lisinopril  Smoking - Continue nicotine  History of seizures No seizures here, not on AED  Depression and anxiety - Continue BuSpar and sertraline  COPD No active disease          Disposition: Status is: Inpatient  Remains inpatient appropriate because:Unsafe d/c plan  Dispo: The patient is from: Home              Anticipated d/c is to: SNF              Patient currently is medically stable to d/c.   Difficult to place patient No   Patient was admitted for ischemic gangrene.  She underwent transmetatarsal amputation and orthopedic surgery thinks that her wound appears good.  They recommend follow up as an outpatient.    Given her pain she will need substantial rehab to return to her prior level of function, safe discharge to SNF is being sought.  SHe has  had her COVID booster and a repeat covid test is pending.   Level of care: Med-Surg       MDM: The below labs and imaging reports were reviewed and summarized above.  Medication management as above.    DVT prophylaxis: SCDs Start: 11/25/20 1212  Code Status: FULL Family Communication:       Subjective: Pain in the foot is moderate, improve with oxycodone.  No fever, respiratory distres, vomiting, confusion.       Objective: Vitals:   12/06/20 2058 12/07/20 0432 12/07/20 0433 12/07/20 0813  BP: 130/73 (!) 145/57  140/82  Pulse: 93 87  96  Resp: '18 20  18  '$ Temp: 97.6 F (36.4 C) (!) 97.5 F (36.4 C)  98 F (36.7 C)  TempSrc: Oral     SpO2: 96% 94%  (!) 83%  Weight:   56.9 kg   Height:       No intake or output data in the 24 hours ending 12/07/20 0943  Filed Weights   12/04/20 0500 12/06/20 0500 12/07/20 0433  Weight: 60.7 kg 60 kg 56.9 kg    Examination: General appearance: Elderly adult female, lying in bed, no acute distress. HEENT:    Skin:  Cardiac: RRR, no murmurs, no LE edema Respiratory: Normal respiratory effort, no rales or wheezing. Abdomen: Abdomen is soft and nontender MSK: Left foot is wrapped Neuro: awake and alert, moves upper extremities with weakness but symmetric weakness, speech fluent, hard of hearing Psych: Attention normal, afect normal  Data Reviewed: I have personally reviewed following labs and imaging studies:  CBC: Recent Labs  Lab 12/01/20 0459 12/02/20 0657 12/03/20 0431  WBC 10.7* 10.2 10.8*  NEUTROABS 9.5* 7.6  --   HGB 9.2* 8.6* 9.1*  HCT 30.4* 29.0* 31.7*  MCV 73.8* 77.1* 76.4*  PLT 266 224 0000000   Basic Metabolic Panel: Recent Labs  Lab 12/02/20 0657 12/03/20 0431  NA 138 139  K 4.4 4.3  CL 100 97*  CO2 29 32  GLUCOSE 86 81  BUN 33* 29*  CREATININE 0.95 0.93  CALCIUM 9.5 9.3  MG 2.1  --    GFR: Estimated Creatinine Clearance: 46.2 mL/min (by C-G formula based on SCr of 0.93  mg/dL). Liver Function Tests: No results for input(s): AST, ALT, ALKPHOS, BILITOT, PROT, ALBUMIN in the last 168 hours. No results for input(s): LIPASE, AMYLASE in the last 168 hours. No results for input(s): AMMONIA in the last 168 hours. Coagulation Profile: No results for input(s): INR, PROTIME in the last 168 hours. Cardiac Enzymes: No results for input(s): CKTOTAL, CKMB, CKMBINDEX, TROPONINI in the last 168 hours. BNP (last 3 results) No results for input(s): PROBNP in the last 8760 hours. HbA1C: No results for input(s): HGBA1C in the last 72 hours. CBG: Recent Labs  Lab 12/03/20 0741 12/04/20 0735 12/05/20 0749 12/06/20 0819 12/07/20 0814  GLUCAP 88 97 106* 98 101*   Lipid Profile: No results for input(s): CHOL, HDL, LDLCALC, TRIG, CHOLHDL, LDLDIRECT in the last 72 hours. Thyroid Function Tests: No results for input(s): TSH, T4TOTAL, FREET4, T3FREE, THYROIDAB in the last 72 hours. Anemia Panel: No results for input(s): VITAMINB12, FOLATE, FERRITIN, TIBC, IRON, RETICCTPCT in the last 72 hours. Urine analysis:    Component Value Date/Time   COLORURINE COLORLESS (A) 07/24/2017 1159   APPEARANCEUR CLEAR (A) 07/24/2017 1159   APPEARANCEUR Hazy 03/27/2014 1845   LABSPEC 1.006 07/24/2017 1159   LABSPEC 1.028 03/27/2014 1845   PHURINE 7.0 07/24/2017 1159   GLUCOSEU NEGATIVE 07/24/2017 1159   GLUCOSEU Negative 03/27/2014 1845   HGBUR NEGATIVE 07/24/2017 1159   BILIRUBINUR NEGATIVE 07/24/2017 1159   BILIRUBINUR Negative 03/27/2014 1845   KETONESUR NEGATIVE 07/24/2017 1159   PROTEINUR NEGATIVE 07/24/2017 1159   NITRITE NEGATIVE 07/24/2017 1159   LEUKOCYTESUR NEGATIVE 07/24/2017 1159   LEUKOCYTESUR 3+ 03/27/2014 1845   Sepsis Labs: '@LABRCNTIP'$ (procalcitonin:4,lacticacidven:4)  ) No results found for this or any previous visit (from the past 240 hour(s)).        Radiology Studies: No results found.      Scheduled Meds:  acetaminophen  1,000 mg Oral TID    vitamin C  250 mg Oral BID   atorvastatin  10 mg Oral Daily   busPIRone  7.5 mg Oral BID   carvedilol  3.125 mg Oral BID WC   clopidogrel  75 mg Oral Daily   feeding supplement  237 mL Oral TID BM   ferrous sulfate  325 mg Oral Q breakfast   furosemide  20 mg Oral Daily   levofloxacin  250 mg Oral Q24H   lisinopril  5 mg Oral Daily   multivitamin with minerals  1 tablet Oral Daily   multivitamin-lutein  1 capsule Oral Daily   nicotine  21 mg Transdermal Daily   pantoprazole  40 mg Oral Daily   senna-docusate  2 tablet Oral BID   sertraline  100 mg Oral BID   Continuous Infusions:  sodium chloride 0 mL/hr at 11/26/20 1306     LOS: 12 days  Time spent: 25 minutes    Edwin Dada, MD [facility] 12/07/2020, 9:43 AM     Please page though [place] or Epic secure chat:  For [employer], Adult nurse

## 2020-12-07 NOTE — TOC Progression Note (Signed)
Transition of Care [facility]) - Progression Note    Patient Details  Name: Michele Matthews MRN: [id number] Date of Birth: April 13, 1946  [facility] of [facility]) CM/SW Contact  Beverly Sessions, RN Phone Number: 12/07/2020, 3:22 PM  Clinical Narrative:      Confirmed with Magda Paganini at [facility] that patient can admit tomorrow Auth good through 8/9 MD has ordered repeat covid test   Expected Discharge Plan: [place] Barriers to Discharge: Continued Medical Work up  Expected Discharge Plan and Services Expected Discharge Plan: [place] arrangements for the past 2 months: Single Family Home                                       Social Determinants of Health (SDOH) Interventions    Readmission Risk Interventions No flowsheet data found.

## 2020-12-07 NOTE — Progress Notes (Signed)
Occupational Therapy Treatment Patient Details Name: Michele Matthews MRN: [id number] DOB: 1945/12/21 Today's Date: 12/07/2020    History of present illness Patient is a 75 year old female with  left forefoot gangrene, PVD, DM2 with polyneuropathy, osteomyelitis left forefoot. Multiple previous re-vascularizations attempts.  Now s/p transmetatarsal amputation left foot.   OT comments  Michele Matthews was seen for OT treatment on this date, overlapping with PT for safe mobility. Upon arrival to room pt completing bed change with NT in room - pt crying and fearful of rolling requiring +2 assist for L+R rolling and pericare. PT in room to assist upon completion of toileting. Pt require MOD A x2 exit R side of bed. Initial CGA decreasing to MOD A + single UE support static sitting EOB 2/2 progressive R lateral lean - suspect self-limiting, pt appears anxious with EOB/mobility. MAX A don R sock seated EOB.MAX A x2 return to bed. Pt making good progress toward goals. Pt continues to benefit from skilled OT services to maximize return to PLOF and minimize risk of future falls, injury, caregiver burden, and readmission. Will continue to follow POC. Discharge recommendation remains appropriate.    Follow Up Recommendations  SNF;Supervision/Assistance - 24 hour    Equipment Recommendations  Other (comment) (TBD at next veneu of care)    Recommendations for Other Services      Precautions / Restrictions Precautions Precautions: Fall Restrictions Weight Bearing Restrictions: Yes LLE Weight Bearing: Non weight bearing       Mobility Bed Mobility Overal bed mobility: Needs Assistance Bed Mobility: Supine to Sit;Sit to Supine Rolling: +2 for physical assistance;Max assist (pt resistant and crying)   Supine to sit: Mod assist;+2 for physical assistance;HOB elevated Sit to supine: Max assist;+2 for physical assistance   General bed mobility comments: Encouragement and multi-modal cues necessary for  participation due to anxiety and pain. Pt able to move BLE off edge of bed and reach to bed rails during supine > sit    Transfers                 General transfer comment: Transfers were not attempted secondary to safety and the patient's high anxiety levels    Balance Overall balance assessment: Needs assistance Sitting-balance support: Single extremity supported;Feet supported Sitting balance-Leahy Scale: Poor Sitting balance - Comments: R lateral lean, pt pushes with attempt to correct Postural control: Right lateral lean                                 ADL either performed or assessed with clinical judgement   ADL Overall ADL's : Needs assistance/impaired                                       General ADL Comments: MAX A x2 toileting at bed level. MOD A + single UE support static sitting EOB - suspect self-limiting, pt appears anxious with EOB/mobility. MAX A don R sock seated EOB.      Cognition Arousal/Alertness: Awake/alert Behavior During Therapy: Anxious;WFL for tasks assessed/performed Overall Cognitive Status: No family/caregiver present to determine baseline cognitive functioning                                          Exercises Exercises:  Other exercises General Exercises - Lower Extremity Long Arc Quad: AROM;Both;5 reps;Seated Other Exercises Other Exercises: Pt educated re: OT role, DME recs, d/c recs, falls prevention, ECS, HEP, importance of mobility for functional strengthening Other Exercises: LBD,  grooming, rolling, toielting, sup<>sit, sitting balance/tolerance      General Comments SpO2 97% on RA seated HOB    Pertinent Vitals/ Pain       Pain Assessment: Faces Faces Pain Scale: Hurts even more Pain Location: back Pain Descriptors / Indicators: Sore;Grimacing Pain Intervention(s): Limited activity within patient's tolerance;Premedicated before session;Repositioned         Frequency  Min  2X/week        Progress Toward Goals  OT Goals(current goals can now be found in the care plan section)  Progress towards OT goals: Progressing toward goals  Acute Rehab OT Goals Patient Stated Goal: to have less pain OT Goal Formulation: With patient Time For Goal Achievement: 12/18/20 Potential to Achieve Goals: Fair ADL Goals Pt Will Perform Grooming: with modified independence;sitting;with set-up;with supervision Pt Will Perform Lower Body Dressing: with min assist;with caregiver independent in assisting;sitting/lateral leans Pt Will Transfer to Toilet: with max assist;stand pivot transfer;bedside commode  Plan Discharge plan remains appropriate;Frequency remains appropriate    Co-evaluation    PT/OT/SLP Co-Evaluation/Treatment: Yes Reason for Co-Treatment: To address functional/ADL transfers;Necessary to address cognition/behavior during functional activity PT goals addressed during session: Mobility/safety with mobility OT goals addressed during session: ADL's and self-care      AM-PAC OT "6 Clicks" Daily Activity     Outcome Measure   Help from another person eating meals?: None Help from another person taking care of personal grooming?: A Lot Help from another person toileting, which includes using toliet, bedpan, or urinal?: A Lot Help from another person bathing (including washing, rinsing, drying)?: A Lot Help from another person to put on and taking off regular upper body clothing?: A Little Help from another person to put on and taking off regular lower body clothing?: A Lot 6 Click Score: 15    End of Session    OT Visit Diagnosis: Other abnormalities of gait and mobility (R26.89);Muscle weakness (generalized) (M62.81)   Activity Tolerance Patient tolerated treatment well   Patient Left in bed;with call bell/phone within reach;with bed alarm set   Nurse Communication          Time: 1010-1038 OT Time Calculation (min): 28 min  Charges: OT  General Charges $OT Visit: 1 Visit OT Treatments $Self Care/Home Management : 8-22 mins  Dessie Coma, M.S. OTR/L  12/07/20, 2:22 PM  ascom [phone]

## 2020-12-07 NOTE — Progress Notes (Addendum)
vvPhysical Therapy Treatment Patient Details Name: Michele Matthews MRN: [id number] DOB: 01/04/1946 Today's Date: 12/07/2020    History of Present Illness Patient is a 75 year old female with  left forefoot gangrene, PVD, DM2 with polyneuropathy, osteomyelitis left forefoot. Multiple previous re-vascularizations attempts.  Now s/p transmetatarsal amputation left foot.    PT Comments    Pt alert working with OT at start of session. Mod-A + 2 for physical assist w/ HOB elevated for supine <>. Pt was able to participate by holding onto bed rails and moving BLE off edge of bed. Continued to work on seated balance at EOB w/ CGA and BUE support with intermittent Mod-A due to R lateral and posterior trunk lean during periods of fatigue. Pt states being anxious seated at EOB, but follows 1-step commands with extra time following cues. Skilled PT intervention is indicated to address deficits in function, mobility, and to return to PLOF as able. Discharge recommendations are SNF.   Follow Up Recommendations  SNF;Supervision for mobility/OOB     Equipment Recommendations  None recommended by PT    Recommendations for Other Services       Precautions / Restrictions Precautions Precautions: Fall Restrictions Weight Bearing Restrictions: Yes LLE Weight Bearing: Non weight bearing    Mobility  Bed Mobility Overal bed mobility: Needs Assistance Bed Mobility: Supine to Sit     Supine to sit: +2 for physical assistance;HOB elevated;Mod assist Sit to supine: Max assist;+2 for physical assistance   General bed mobility comments: Encouragement and multi-modal cues necessary for participation due to anxiety and pain    Transfers                 General transfer comment: Transfers were not attempted secondary to safety and the patient's high anxiety levels  Ambulation/Gait                 Stairs             Wheelchair Mobility    Modified Rankin (Stroke Patients Only)        Balance Overall balance assessment: Needs assistance Sitting-balance support: Bilateral upper extremity supported Sitting balance-Leahy Scale: Poor Sitting balance - Comments: Support is necessary due to posterior lean. Pt is unable to achieve  trunk extension which further exacerbates seated balance. Postural control: Posterior lean                                  Cognition                                              Exercises General Exercises - Lower Extremity Long Arc Quad: AROM;Both;5 reps;Seated    General Comments        Pertinent Vitals/Pain Pain Assessment: Faces Faces Pain Scale: Hurts even more Pain Location: back Pain Descriptors / Indicators: Sore;Grimacing Pain Intervention(s): Limited activity within patient's tolerance;Monitored during session;Repositioned    Home Living                      Prior Function            PT Goals (current goals can now be found in the care plan section) Acute Rehab PT Goals Patient Stated Goal: to have less pain PT Goal Formulation: With patient/family Time For Goal  Achievement: 12/17/20 Potential to Achieve Goals: Fair Progress towards PT goals: Progressing toward goals    Frequency    Min 2X/week      PT Plan Current plan remains appropriate    Co-evaluation PT/OT/SLP Co-Evaluation/Treatment: Yes Reason for Co-Treatment: To address functional/ADL transfers PT goals addressed during session: Mobility/safety with mobility        AM-PAC PT "6 Clicks" Mobility   Outcome Measure  Help needed turning from your back to your side while in a flat bed without using bedrails?: A Little Help needed moving from lying on your back to sitting on the side of a flat bed without using bedrails?: A Lot Help needed moving to and from a bed to a chair (including a wheelchair)?: A Lot Help needed standing up from a chair using your arms (e.g., wheelchair or bedside chair)?: A  Lot Help needed to walk in hospital room?: Total Help needed climbing 3-5 steps with a railing? : Total 6 Click Score: 11    End of Session   Activity Tolerance: Patient limited by pain;Other (comment) (treatment limited by anxiety) Patient left: in bed;with call bell/phone within reach;with bed alarm set;with family/visitor present Nurse Communication: Mobility status PT Visit Diagnosis: Pain;Muscle weakness (generalized) (M62.81);Unsteadiness on feet (R26.81) Pain - Right/Left: Left Pain - part of body: Leg (back)     Time: [id number] PT Time Calculation (min) (ACUTE ONLY): 28 min  Charges:                        [employer], SPT

## 2020-12-08 LAB — GLUCOSE, CAPILLARY: Glucose-Capillary: 99 mg/dL (ref 70–99)

## 2020-12-08 LAB — RESP PANEL BY RT-PCR (FLU A&B, COVID) ARPGX2
Influenza A by PCR: NEGATIVE
Influenza B by PCR: NEGATIVE
SARS Coronavirus 2 by RT PCR: NEGATIVE

## 2020-12-08 MED ORDER — ASCORBIC ACID 250 MG PO TABS: 250.0000 mg | ORAL_TABLET | Freq: Two times a day (BID) | ORAL | Status: AC

## 2020-12-08 MED ORDER — SENNOSIDES-DOCUSATE SODIUM 8.6-50 MG PO TABS: 2.0000 | ORAL_TABLET | Freq: Two times a day (BID) | ORAL | Status: DC

## 2020-12-08 MED ORDER — LEVOFLOXACIN 250 MG PO TABS: 250.0000 mg | ORAL_TABLET | ORAL | 0 refills | Status: AC

## 2020-12-08 MED ORDER — FERROUS SULFATE 325 (65 FE) MG PO TABS: 325.0000 mg | ORAL_TABLET | Freq: Every day | ORAL | 3 refills | Status: DC

## 2020-12-08 MED ORDER — OXYCODONE HCL 5 MG PO TABS: 5.0000 mg | ORAL_TABLET | ORAL | 0 refills | Status: DC | PRN

## 2020-12-08 MED ORDER — ACETAMINOPHEN 500 MG PO TABS: 1000.0000 mg | ORAL_TABLET | Freq: Three times a day (TID) | ORAL | 0 refills | Status: AC

## 2020-12-08 MED ORDER — NICOTINE 21 MG/24HR TD PT24: 21.0000 mg | MEDICATED_PATCH | Freq: Every day | TRANSDERMAL | 0 refills | Status: DC

## 2020-12-08 NOTE — Progress Notes (Signed)
EMS transport has been notified of need for transfer to [facility].

## 2020-12-08 NOTE — Discharge Summary (Signed)
Physician Discharge Summary  AZRIEL MCKELLER [id number] DOB: 04-Jul-1945 DOA: 11/25/2020  PCP: Devol date: 11/25/2020 Discharge date: 12/08/2020  Admitted From: Home  Disposition:  SNF   Recommendations for Outpatient Follow-up:  Follow up with Podiatry Dr. Luana Shu in 1 week Follow up with Vascular Surgery Dr. Lucky Cowboy as directed Follow up with PCP 1 week after discharge from SNF Please obtain iron studies in 3 months          Home Health: N/A  Equipment/Devices: TBD at SNF  Discharge Condition: Fair  CODE STATUS: FULL Diet recommendation: Cardiac  Brief/Interim Summary: Michele Matthews is a 75 y.o. F with HTN, COPD, stroke, history of depression and anxiety, seizures, CAD, sCHF EF 25-30%, upper GI bleed, anemia, smoking, and COPD who presented with left foot pain and infection.  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 Luana Shu Patient admitted and underwent left LE angioplasty by Vascular surgery on 7/27 and then five days later on 8/1.  Podiatry subsequently able to perform transmetatarsal amputation on 8/3.  Post-op course uncomplicated.  Completed 5 days levaquin, will take two more days.    Patient to continue nonweightbearing to the left foot at all times until suture/staple removal by Dr. Luana Shu.      Iron deficiency anemia Ferritin severely low, given Venofer on 7/30.  Continue oral iron on discharge.  Chronic systolic and diastolic CHF Cerebrovascular disease, secondary prevention Coronary artery disease and peripheral vascular disease Hypertension BP controlled,  euvolemic  Smoking Smoking cessation recommended, modalities discussed.  History of seizures No seizures here, not on AED  Depression and anxiety   COPD No active disease               Discharge Instructions  Discharge Instructions     Diet - low sodium heart healthy   Complete by: As directed    Discharge wound care:   Complete by: As directed    Leave current dressing intact until appointment in 1 week with Dr. Luana Shu. If soiled or loosened, can replace in following way: Apply Xeroform to the incision line followed by 4 x 4 gauze, ABD, Kerlix, Ace wrap.   Increase activity slowly   Complete by: As directed       Allergies as of 12/08/2020   No Known Allergies      Medication List     STOP taking these medications    aspirin EC 81 MG tablet   warfarin 5 MG tablet Commonly known as: COUMADIN       TAKE these medications    acetaminophen 500 MG tablet Commonly known as: TYLENOL Take 2 tablets (1,000 mg total) by mouth 3 (three) times daily.   ascorbic acid 250 MG tablet Commonly known as: VITAMIN C Take 1 tablet (250 mg total) by mouth 2 (two) times daily.   atorvastatin 10 MG tablet Commonly known as: LIPITOR TAKE 1 TABLET EVERY DAY   busPIRone 7.5 MG tablet Commonly known as: BUSPAR Take 1 tablet (7.5 mg total) by mouth 2 (two) times daily.   carvedilol 3.125 MG tablet Commonly known as: COREG Take 1 tablet (3.125 mg total) by mouth 2 (  two) times daily with a meal.   clopidogrel 75 MG tablet Commonly known as: PLAVIX Take 75 mg by mouth daily.   diphenhydramine-acetaminophen 25-500 MG Tabs tablet Commonly known as: TYLENOL PM Take 1 tablet by mouth at bedtime as needed.   feeding supplement Liqd Take 237 mLs by mouth 3 (three) times daily between meals.   ferrous sulfate 325 (65 FE) MG tablet Take 1 tablet (325 mg total) by mouth daily with breakfast. Start taking on: December 09, 2020   furosemide 20 MG tablet Commonly known  as: Lasix Take 1 tablet (20 mg total) by mouth daily.   hydrOXYzine 10 MG tablet Commonly known as: ATARAX/VISTARIL Take 1 tablet (10 mg total) by mouth 2 (two) times daily as needed for anxiety (sleep).   levofloxacin 250 MG tablet Commonly known as: LEVAQUIN Take 1 tablet (250 mg total) by mouth daily for 2 days.   lisinopril 5 MG tablet Commonly known as: ZESTRIL Take 5 mg by mouth daily.   multivitamin tablet Take 1 tablet by mouth daily.   nicotine 21 mg/24hr patch Commonly known as: NICODERM CQ - dosed in mg/24 hours Place 1 patch (21 mg total) onto the skin daily. Start taking on: December 09, 2020   oxyCODONE 5 MG immediate release tablet Commonly known as: Oxy IR/ROXICODONE Take 1 tablet (5 mg total) by mouth every 4 (four) hours as needed for moderate pain.   pantoprazole 40 MG tablet Commonly known as: PROTONIX TAKE 1 TABLET EVERY DAY   senna-docusate 8.6-50 MG tablet Commonly known as: Senokot-S Take 2 tablets by mouth 2 (two) times daily.   sertraline 100 MG tablet Commonly known as: ZOLOFT Take 1 tablet (100 mg total) by mouth 2 (two) times daily.   VISINE OP Place 1 drop into both eyes 2 (two) times daily as needed (dry eyes).               Discharge Care Instructions  (From admission, onward)           Start     Ordered   12/08/20 0000  Discharge wound care:       Comments: Leave current dressing intact until appointment in 1 week with Dr. Luana Shu. If soiled or loosened, can replace in following way: Apply Xeroform to the incision line followed by 4 x 4 gauze, ABD, Kerlix, Ace wrap.   12/08/20 1628            Follow-up Information     Dew, Erskine Squibb, MD Follow up in 1 month(s).   Specialties: Vascular Surgery, Radiology, Interventional Cardiology Why: Can see Dew or Arna Medici. Will need ABI with visit. Contact information: [place] [address] [phone]         Caroline More, DPM. Call in 1 week(s).    Specialty: Podiatry Contact information: [place] [address] [phone]                No Known Allergies  Consultations: Vaascular surgery Podiatry   Procedures/Studies: DG Chest 2 View  Result Date: 11/29/2020 CLINICAL DATA:  Aspiration pneumonia EXAM: CHEST - 2 VIEW COMPARISON:  11/25/2020 FINDINGS: Hiatal hernia again noted. No confluent airspace opacities or effusions. No acute bony abnormality. IMPRESSION: Hiatal hernia. No active cardiopulmonary disease. Electronically Signed   By: Rolm Baptise M.D.   On: 11/29/2020 12:21   PERIPHERAL VASCULAR CATHETERIZATION  Result Date: 11/30/2020 See surgical note for result.  PERIPHERAL VASCULAR CATHETERIZATION  Result Date: 11/25/2020 See surgical  note for result.  DG Chest Portable 1 View  Result Date: 11/25/2020 CLINICAL DATA:  eval for cardiomegaly, edeam, infiltrate EXAM: PORTABLE CHEST 1 VIEW COMPARISON:  None. FINDINGS: Large hiatal hernia. Heart is normal size. Linear scarring in the right mid lung and right base. No confluent opacities on the left. No effusions or acute bony abnormality. IMPRESSION: Areas of scarring in the right lung.  No active disease. Large hiatal hernia. Electronically Signed   By: Rolm Baptise M.D.   On: 11/25/2020 11:16   DG Foot 2 Views Left  Result Date: 12/02/2020 CLINICAL DATA:  Postop EXAM: LEFT FOOT - 2 VIEW COMPARISON:  11/25/2020 FINDINGS: Interval transmetatarsal amputation of digits 1 through 5. Cut margins are sharp. No acute osseous abnormality IMPRESSION: Interval transmetatarsal amputation of the first through fifth digits with expected postsurgical change Electronically Signed   By: Donavan Foil M.D.   On: 12/02/2020 20:40   DG Foot Complete Left  Result Date: 11/25/2020 CLINICAL DATA:  Left foot wound for 1 week EXAM: LEFT FOOT - COMPLETE 3+ VIEW COMPARISON:  None. FINDINGS: No evidence of acute fracture. No dislocation. Diffuse osseous  demineralization. No definite bony erosion or site of periosteal elevation. There is air seen within the soft tissues at the dorsum of the foot overlying the level of the MTP joints involving the full width of the foot. A small amount of air is seen tracking proximally to the level of the mid metatarsal diaphysis. IMPRESSION: 1. Soft tissue air at the dorsum of the foot overlying the level of the MTP joints with a small amount of air is seen tracking proximally to the level of the mid metatarsal diaphysis. Findings suggestive of soft tissue infection with a gas-forming organism. 2. No radiographic evidence of acute osteomyelitis. Electronically Signed   By: Davina Poke D.O.   On: 11/25/2020 11:19      Subjective: Pain in left mid foot, inner.  No fever, chills, swelling, redness.  No vomiting, confusion.  Discharge Exam: Vitals:   12/08/20 0757 12/08/20 1510  BP: (!) 154/96 (!) 114/59  Pulse: 96 97  Resp: 17 16  Temp: 98 F (36.7 C) 98 F (36.7 C)  SpO2: 99% 97%   Vitals:   12/07/20 1942 12/08/20 0344 12/08/20 0757 12/08/20 1510  BP: (!) 125/59 (!) 151/71 (!) 154/96 (!) 114/59  Pulse: 78 90 96 97  Resp: '20 20 17 16  '$ Temp: 98.2 F (36.8 C) 98.2 F (36.8 C) 98 F (36.7 C) 98 F (36.7 C)  TempSrc: Oral Oral Oral Oral  SpO2: 99% 94% 99% 97%  Weight:      Height:        General: Pt is alert, awake, not in acute distress Cardiovascular: RRR, nl S1-S2, no murmurs appreciated.   No LE edema.   Respiratory: Normal respiratory rate and rhythm.  CTAB without rales or wheezes. Abdominal: Abdomen soft and non-tender.  No distension or HSM.   Neuro/Psych: Strength symmetric in upper and lower extremities.  Judgment and insight appear normal.   The results of significant diagnostics from this hospitalization (including imaging, microbiology, ancillary and laboratory) are listed below for reference.     Microbiology: No results found for this or any previous visit (from the past 240  hour(s)).   Labs: BNP (last 3 results) Recent Labs    11/25/20 1024  BNP 123XX123*   Basic Metabolic Panel: Recent Labs  Lab 12/02/20 0657 12/03/20 0431  NA 138 139  K 4.4 4.3  CL 100 97*  CO2 29 32  GLUCOSE 86 81  BUN 33* 29*  CREATININE 0.95 0.93  CALCIUM 9.5 9.3  MG 2.1  --    Liver Function Tests: No results for input(s): AST, ALT, ALKPHOS, BILITOT, PROT, ALBUMIN in the last 168 hours. No results for input(s): LIPASE, AMYLASE in the last 168 hours. No results for input(s): AMMONIA in the last 168 hours. CBC: Recent Labs  Lab 12/02/20 0657 12/03/20 0431  WBC 10.2 10.8*  NEUTROABS 7.6  --   HGB 8.6* 9.1*  HCT 29.0* 31.7*  MCV 77.1* 76.4*  PLT 224 254   Cardiac Enzymes: No results for input(s): CKTOTAL, CKMB, CKMBINDEX, TROPONINI in the last 168 hours. BNP: Invalid input(s): POCBNP CBG: Recent Labs  Lab 12/04/20 0735 12/05/20 0749 12/06/20 0819 12/07/20 0814 12/08/20 0758  GLUCAP 97 106* 98 101* 99   D-Dimer No results for input(s): DDIMER in the last 72 hours. Hgb A1c No results for input(s): HGBA1C in the last 72 hours. Lipid Profile No results for input(s): CHOL, HDL, LDLCALC, TRIG, CHOLHDL, LDLDIRECT in the last 72 hours. Thyroid function studies No results for input(s): TSH, T4TOTAL, T3FREE, THYROIDAB in the last 72 hours.  Invalid input(s): FREET3 Anemia work up No results for input(s): VITAMINB12, FOLATE, FERRITIN, TIBC, IRON, RETICCTPCT in the last 72 hours. Urinalysis    Component Value Date/Time   COLORURINE COLORLESS (A) 07/24/2017 1159   APPEARANCEUR CLEAR (A) 07/24/2017 1159   APPEARANCEUR Hazy 03/27/2014 1845   LABSPEC 1.006 07/24/2017 1159   LABSPEC 1.028 03/27/2014 1845   PHURINE 7.0 07/24/2017 1159   GLUCOSEU NEGATIVE 07/24/2017 1159   GLUCOSEU Negative 03/27/2014 1845   HGBUR NEGATIVE 07/24/2017 1159   BILIRUBINUR NEGATIVE 07/24/2017 1159   BILIRUBINUR Negative 03/27/2014 1845   KETONESUR NEGATIVE 07/24/2017 1159    PROTEINUR NEGATIVE 07/24/2017 1159   NITRITE NEGATIVE 07/24/2017 1159   LEUKOCYTESUR NEGATIVE 07/24/2017 1159   LEUKOCYTESUR 3+ 03/27/2014 1845   Sepsis Labs Invalid input(s): PROCALCITONIN,  WBC,  LACTICIDVEN Microbiology No results found for this or any previous visit (from the past 240 hour(s)).   Time coordinating discharge: 25 minutes The [place] controlled substances registry was reviewed for this patient prior to filling the <5 days supply controlled substances script.    30 Day Unplanned Readmission Risk Score    Flowsheet Row ED to Hosp-Admission (Current) from 11/25/2020 in [place]  30 Day Unplanned Readmission Risk Score (%) 17.74 Filed at 12/08/2020 1600       This score is the patient's risk of an unplanned readmission within 30 days of being discharged (0 -100%). The score is based on dignosis, age, lab data, medications, orders, and past utilization.   Low:  0-14.9   Medium: 15-21.9   High: 22-29.9   Extreme: 30 and above            SIGNED:   Edwin Dada, MD  [facility] 12/08/2020, 4:28 PM

## 2020-12-08 NOTE — Progress Notes (Signed)
EMS at bedside for pt transport. Report given to EMT.

## 2020-12-08 NOTE — Progress Notes (Signed)
Nutrition Follow Up Note   DOCUMENTATION CODES:   Severe malnutrition in context of chronic illness  INTERVENTION:   Ensure Enlive po TID, each supplement provides 350 kcal and 20 grams of protein  Magic cup TID with meals, each supplement provides 290 kcal and 9 grams of protein  Ocuvite daily for wound healing (provides zinc, vitamin A, vitamin C, Vitamin E, copper, and selenium)  Vitamin C '250mg'$  po BID   NUTRITION DIAGNOSIS:   Severe Malnutrition related to chronic illness (COPD) as evidenced by severe muscle depletion, severe fat depletion.  GOAL:   Patient will meet greater than or equal to 90% of their needs -progressing   MONITOR:   PO intake, Supplement acceptance, Labs, Weight trends, Skin, I & O's  ASSESSMENT:   75 y.o. female with medical history significant of HTN, HLD, COPD, stroke on Coumadin, GERD, depression with anxiety, seizure, CAD, sCHF with EF 25-30&, UGIB, anemia, tobacco abuse, HOH and COPD who presents with left foot pain and left foot wound with infection now s/p endovascular intervention 7/27  Pt s/p transmetatarsal amputation 8/3  Pt continues to have fair appetite and oral intake in hospital; pt eating <50% of meals but is drinking her Ensure supplements. Recommend continue supplements and vitamins until wound healing is complete. Per chart, pt appears fairly weight stable. Pt pending SNF placement.   Medications reviewed and include: vitamin C, plavix, ferrous sulfate, lasix, MVI, ocuvite, nicotine, senokot, protonix  Labs reviewed: none recent   Diet Order:   Diet Order             DIET DYS 3 Room service appropriate? Yes; Fluid consistency: Thin  Diet effective now                  EDUCATION NEEDS:   Education needs have been addressed  Skin:  Skin Assessment: Reviewed RN Assessment (critical limb ischemia with nonhealing wounds to left dorsal foot)  Last BM:  8/7- TYPE 6  Height:   Ht Readings from Last 1 Encounters:   11/25/20 '5\' 5"'$  (1.651 m)    Weight:   Wt Readings from Last 1 Encounters:  12/07/20 56.9 kg    Ideal Body Weight:  56.8 kg  BMI:  Body mass index is 20.87 kg/m.  Estimated Nutritional Needs:   Kcal:  1600-1800kcal/day  Protein:  80-90g/day  Fluid:  1.4-1.6L/day  Koleen Distance MS, RD, LDN Please refer to [facility] for RD and/or RD on-call/weekend/after hours pager

## 2020-12-08 NOTE — TOC Progression Note (Signed)
Transition of Care [facility]) - Progression Note    Patient Details  Name: Michele Matthews MRN: [id number] Date of Birth: [birth year]  Transition of Care [facility]) CM/SW Contact  Beverly Sessions, RN Phone Number: 12/08/2020, 2:49 PM  Clinical Narrative:    Notified this morning by Magda Paganini at [facility] that they would no longer be able to offer a bed  Offers presented to Husband He has accepted the bed at [facility]  Accepted in the [place] started in the hub for [facility] instead   Expected Discharge Plan: [place] Barriers to Discharge: Continued Medical Work up  Expected Discharge Plan and Services Expected Discharge Plan: [place] arrangements for the past 2 months: Single Family Home                                       Social Determinants of Health (SDOH) Interventions    Readmission Risk Interventions No flowsheet data found.

## 2020-12-08 NOTE — TOC Transition Note (Signed)
Transition of Care [facility]) - CM/SW Discharge Note   Patient Details  Name: SEPTEMBER BRIZZOLARA MRN: [id number] Date of Birth: 1945-10-19  Transition of Care [facility]) CM/SW Contact:  Beverly Sessions, RN Phone Number: 12/08/2020, 4:42 PM   Clinical Narrative:     Confirmed with Lavella Lemons at [facility] that patient can discharge today EMS packet printed to floor Bedside RN to call report DC info sent in [place] Bedside RN to call EMS transport after covid results return  Voicemail left for husband and daughter to update     Barriers to Discharge: Continued Medical Work up   Patient Goals and CMS Choice        Discharge Placement                       Discharge Plan and Services                                     Social Determinants of Health (SDOH) Interventions     Readmission Risk Interventions No flowsheet data found.

## 2020-12-08 NOTE — TOC Progression Note (Signed)
Transition of Care [facility]) - Progression Note    Patient Details  Name: ZAYNEB PARILLA MRN: [id number] Date of Birth: February 06, 1946  Transition of Care [facility]) CM/SW Contact  Beverly Sessions, RN Phone Number: 12/08/2020, 4:17 PM  Clinical Narrative:      Received insurance [place].  Candace Cruise id [id number] Valid 8/9-8/11  I have reached out to Lavella Lemons at [facility] to determine if patient can still come today.  Awaiting response   Expected Discharge Plan: [place] Barriers to Discharge: Continued Medical Work up  Expected Discharge Plan and Services Expected Discharge Plan: [place] arrangements for the past 2 months: Single Family Home                                       Social Determinants of Health (SDOH) Interventions    Readmission Risk Interventions No flowsheet data found.

## 2021-01-08 ENCOUNTER — Other Ambulatory Visit (INDEPENDENT_AMBULATORY_CARE_PROVIDER_SITE_OTHER): Payer: Self-pay | Admitting: Vascular Surgery

## 2021-01-08 DIAGNOSIS — I70262 Atherosclerosis of native arteries of extremities with gangrene, left leg: Secondary | ICD-10-CM

## 2021-01-08 DIAGNOSIS — Z95820 Peripheral vascular angioplasty status with implants and grafts: Secondary | ICD-10-CM

## 2021-01-11 ENCOUNTER — Encounter (INDEPENDENT_AMBULATORY_CARE_PROVIDER_SITE_OTHER): Payer: Medicare HMO

## 2021-01-11 ENCOUNTER — Ambulatory Visit (INDEPENDENT_AMBULATORY_CARE_PROVIDER_SITE_OTHER): Payer: Medicare HMO | Admitting: Nurse Practitioner

## 2021-02-02 ENCOUNTER — Ambulatory Visit (INDEPENDENT_AMBULATORY_CARE_PROVIDER_SITE_OTHER): Payer: Medicare HMO | Admitting: Nurse Practitioner

## 2021-02-02 ENCOUNTER — Encounter (INDEPENDENT_AMBULATORY_CARE_PROVIDER_SITE_OTHER): Payer: Medicare HMO

## 2021-06-11 DIAGNOSIS — F17210 Nicotine dependence, cigarettes, uncomplicated: Secondary | ICD-10-CM | POA: Diagnosis not present

## 2021-06-11 DIAGNOSIS — Z1159 Encounter for screening for other viral diseases: Secondary | ICD-10-CM | POA: Diagnosis not present

## 2021-06-11 DIAGNOSIS — E7800 Pure hypercholesterolemia, unspecified: Secondary | ICD-10-CM | POA: Diagnosis not present

## 2021-06-11 DIAGNOSIS — J449 Chronic obstructive pulmonary disease, unspecified: Secondary | ICD-10-CM | POA: Diagnosis not present

## 2021-06-11 DIAGNOSIS — F411 Generalized anxiety disorder: Secondary | ICD-10-CM | POA: Diagnosis not present

## 2021-06-11 DIAGNOSIS — I110 Hypertensive heart disease with heart failure: Secondary | ICD-10-CM | POA: Diagnosis not present

## 2021-06-11 DIAGNOSIS — I5042 Chronic combined systolic (congestive) and diastolic (congestive) heart failure: Secondary | ICD-10-CM | POA: Diagnosis not present

## 2021-09-05 ENCOUNTER — Encounter: Payer: Self-pay | Admitting: Emergency Medicine

## 2021-09-05 ENCOUNTER — Other Ambulatory Visit: Payer: Self-pay

## 2021-09-05 ENCOUNTER — Inpatient Hospital Stay: Payer: Medicare HMO

## 2021-09-05 ENCOUNTER — Inpatient Hospital Stay
Admission: EM | Admit: 2021-09-05 | Discharge: 2021-09-09 | DRG: 378 | Disposition: A | Discharge status: Home Health | Payer: Medicare HMO | Attending: Internal Medicine | Admitting: Internal Medicine

## 2021-09-05 ENCOUNTER — Emergency Department: Payer: Medicare HMO

## 2021-09-05 DIAGNOSIS — K2961 Other gastritis with bleeding: Secondary | ICD-10-CM | POA: Diagnosis not present

## 2021-09-05 DIAGNOSIS — R519 Headache, unspecified: Secondary | ICD-10-CM | POA: Diagnosis not present

## 2021-09-05 DIAGNOSIS — Z95820 Peripheral vascular angioplasty status with implants and grafts: Secondary | ICD-10-CM

## 2021-09-05 DIAGNOSIS — Z8711 Personal history of peptic ulcer disease: Secondary | ICD-10-CM | POA: Diagnosis not present

## 2021-09-05 DIAGNOSIS — R531 Weakness: Secondary | ICD-10-CM | POA: Diagnosis not present

## 2021-09-05 DIAGNOSIS — K640 First degree hemorrhoids: Secondary | ICD-10-CM | POA: Diagnosis present

## 2021-09-05 DIAGNOSIS — R200 Anesthesia of skin: Secondary | ICD-10-CM | POA: Diagnosis present

## 2021-09-05 DIAGNOSIS — D509 Iron deficiency anemia, unspecified: Secondary | ICD-10-CM | POA: Diagnosis present

## 2021-09-05 DIAGNOSIS — F32A Depression, unspecified: Secondary | ICD-10-CM | POA: Diagnosis present

## 2021-09-05 DIAGNOSIS — I10 Essential (primary) hypertension: Secondary | ICD-10-CM | POA: Diagnosis not present

## 2021-09-05 DIAGNOSIS — J449 Chronic obstructive pulmonary disease, unspecified: Secondary | ICD-10-CM | POA: Diagnosis not present

## 2021-09-05 DIAGNOSIS — D649 Anemia, unspecified: Principal | ICD-10-CM

## 2021-09-05 DIAGNOSIS — Z820 Family history of epilepsy and other diseases of the nervous system: Secondary | ICD-10-CM | POA: Diagnosis not present

## 2021-09-05 DIAGNOSIS — Z7902 Long term (current) use of antithrombotics/antiplatelets: Secondary | ICD-10-CM

## 2021-09-05 DIAGNOSIS — K2950 Unspecified chronic gastritis without bleeding: Secondary | ICD-10-CM | POA: Diagnosis not present

## 2021-09-05 DIAGNOSIS — K635 Polyp of colon: Secondary | ICD-10-CM | POA: Diagnosis not present

## 2021-09-05 DIAGNOSIS — D500 Iron deficiency anemia secondary to blood loss (chronic): Secondary | ICD-10-CM | POA: Diagnosis present

## 2021-09-05 DIAGNOSIS — K449 Diaphragmatic hernia without obstruction or gangrene: Secondary | ICD-10-CM | POA: Diagnosis not present

## 2021-09-05 DIAGNOSIS — D6832 Hemorrhagic disorder due to extrinsic circulating anticoagulants: Secondary | ICD-10-CM | POA: Diagnosis not present

## 2021-09-05 DIAGNOSIS — I2510 Atherosclerotic heart disease of native coronary artery without angina pectoris: Secondary | ICD-10-CM | POA: Diagnosis present

## 2021-09-05 DIAGNOSIS — Z801 Family history of malignant neoplasm of trachea, bronchus and lung: Secondary | ICD-10-CM

## 2021-09-05 DIAGNOSIS — E785 Hyperlipidemia, unspecified: Secondary | ICD-10-CM | POA: Diagnosis present

## 2021-09-05 DIAGNOSIS — Z803 Family history of malignant neoplasm of breast: Secondary | ICD-10-CM

## 2021-09-05 DIAGNOSIS — I110 Hypertensive heart disease with heart failure: Secondary | ICD-10-CM | POA: Diagnosis present

## 2021-09-05 DIAGNOSIS — I739 Peripheral vascular disease, unspecified: Secondary | ICD-10-CM | POA: Diagnosis not present

## 2021-09-05 DIAGNOSIS — Z79899 Other long term (current) drug therapy: Secondary | ICD-10-CM

## 2021-09-05 DIAGNOSIS — K219 Gastro-esophageal reflux disease without esophagitis: Secondary | ICD-10-CM | POA: Diagnosis present

## 2021-09-05 DIAGNOSIS — N390 Urinary tract infection, site not specified: Secondary | ICD-10-CM | POA: Diagnosis present

## 2021-09-05 DIAGNOSIS — I63532 Cerebral infarction due to unspecified occlusion or stenosis of left posterior cerebral artery: Secondary | ICD-10-CM | POA: Diagnosis not present

## 2021-09-05 DIAGNOSIS — K5731 Diverticulosis of large intestine without perforation or abscess with bleeding: Secondary | ICD-10-CM | POA: Diagnosis not present

## 2021-09-05 DIAGNOSIS — K2960 Other gastritis without bleeding: Secondary | ICD-10-CM | POA: Diagnosis not present

## 2021-09-05 DIAGNOSIS — N3000 Acute cystitis without hematuria: Secondary | ICD-10-CM

## 2021-09-05 DIAGNOSIS — T45515A Adverse effect of anticoagulants, initial encounter: Secondary | ICD-10-CM | POA: Diagnosis present

## 2021-09-05 DIAGNOSIS — G4489 Other headache syndrome: Secondary | ICD-10-CM | POA: Diagnosis not present

## 2021-09-05 DIAGNOSIS — Z89432 Acquired absence of left foot: Secondary | ICD-10-CM | POA: Diagnosis not present

## 2021-09-05 DIAGNOSIS — K648 Other hemorrhoids: Secondary | ICD-10-CM | POA: Diagnosis not present

## 2021-09-05 DIAGNOSIS — I6930 Unspecified sequelae of cerebral infarction: Secondary | ICD-10-CM

## 2021-09-05 DIAGNOSIS — K5730 Diverticulosis of large intestine without perforation or abscess without bleeding: Secondary | ICD-10-CM | POA: Diagnosis not present

## 2021-09-05 DIAGNOSIS — K3189 Other diseases of stomach and duodenum: Secondary | ICD-10-CM | POA: Diagnosis not present

## 2021-09-05 DIAGNOSIS — I69398 Other sequelae of cerebral infarction: Secondary | ICD-10-CM

## 2021-09-05 DIAGNOSIS — F419 Anxiety disorder, unspecified: Secondary | ICD-10-CM | POA: Diagnosis not present

## 2021-09-05 DIAGNOSIS — M6281 Muscle weakness (generalized): Secondary | ICD-10-CM | POA: Diagnosis not present

## 2021-09-05 DIAGNOSIS — I5042 Chronic combined systolic (congestive) and diastolic (congestive) heart failure: Secondary | ICD-10-CM | POA: Diagnosis present

## 2021-09-05 DIAGNOSIS — Z720 Tobacco use: Secondary | ICD-10-CM | POA: Diagnosis present

## 2021-09-05 DIAGNOSIS — H9190 Unspecified hearing loss, unspecified ear: Secondary | ICD-10-CM | POA: Diagnosis present

## 2021-09-05 DIAGNOSIS — I959 Hypotension, unspecified: Secondary | ICD-10-CM | POA: Diagnosis not present

## 2021-09-05 DIAGNOSIS — R069 Unspecified abnormalities of breathing: Secondary | ICD-10-CM | POA: Diagnosis not present

## 2021-09-05 DIAGNOSIS — F17210 Nicotine dependence, cigarettes, uncomplicated: Secondary | ICD-10-CM | POA: Diagnosis present

## 2021-09-05 DIAGNOSIS — R29818 Other symptoms and signs involving the nervous system: Secondary | ICD-10-CM | POA: Diagnosis not present

## 2021-09-05 DIAGNOSIS — D125 Benign neoplasm of sigmoid colon: Secondary | ICD-10-CM | POA: Diagnosis not present

## 2021-09-05 DIAGNOSIS — K2970 Gastritis, unspecified, without bleeding: Secondary | ICD-10-CM | POA: Diagnosis not present

## 2021-09-05 LAB — CBC
HCT: 21.8 % — ABNORMAL LOW (ref 36.0–46.0)
HCT: 20.8 % — ABNORMAL LOW (ref 36.0–46.0)
Hemoglobin: 5.3 g/dL — ABNORMAL LOW (ref 12.0–15.0)
Hemoglobin: 5.2 g/dL — ABNORMAL LOW (ref 12.0–15.0)
MCH: 15.2 pg — ABNORMAL LOW (ref 26.0–34.0)
MCH: 15.5 pg — ABNORMAL LOW (ref 26.0–34.0)
MCHC: 24.3 g/dL — ABNORMAL LOW (ref 30.0–36.0)
MCHC: 25 g/dL — ABNORMAL LOW (ref 30.0–36.0)
MCV: 62.6 fL — ABNORMAL LOW (ref 80.0–100.0)
MCV: 61.9 fL — ABNORMAL LOW (ref 80.0–100.0)
Platelets: 240 10*3/uL (ref 150–400)
Platelets: 230 10*3/uL (ref 150–400)
RBC: 3.48 MIL/uL — ABNORMAL LOW (ref 3.87–5.11)
RBC: 3.36 MIL/uL — ABNORMAL LOW (ref 3.87–5.11)
RDW: 21.7 % — ABNORMAL HIGH (ref 11.5–15.5)
RDW: 21.2 % — ABNORMAL HIGH (ref 11.5–15.5)
WBC: 5.4 10*3/uL (ref 4.0–10.5)
WBC: 5.9 10*3/uL (ref 4.0–10.5)
nRBC: 0.9 % — ABNORMAL HIGH (ref 0.0–0.2)
nRBC: 0.5 % — ABNORMAL HIGH (ref 0.0–0.2)

## 2021-09-05 LAB — BASIC METABOLIC PANEL
Anion gap: 11 (ref 5–15)
BUN: 24 mg/dL — ABNORMAL HIGH (ref 8–23)
CO2: 21 mmol/L — ABNORMAL LOW (ref 22–32)
Calcium: 9.3 mg/dL (ref 8.9–10.3)
Chloride: 107 mmol/L (ref 98–111)
Creatinine, Ser: 0.96 mg/dL (ref 0.44–1.00)
GFR, Estimated: >60 mL/min (ref >60)
Glucose, Bld: 105 mg/dL — ABNORMAL HIGH (ref 70–99)
Potassium: 4.2 mmol/L (ref 3.5–5.1)
Sodium: 139 mmol/L (ref 135–145)

## 2021-09-05 LAB — HEPATIC FUNCTION PANEL
ALT: 12 U/L (ref 0–44)
AST: 34 U/L (ref 15–41)
Albumin: 3.6 g/dL (ref 3.5–5.0)
Alkaline Phosphatase: 54 U/L (ref 38–126)
Bilirubin, Direct: 0.2 mg/dL (ref 0.0–0.2)
Indirect Bilirubin: 0.6 mg/dL (ref 0.3–0.9)
Total Bilirubin: 0.8 mg/dL (ref 0.3–1.2)
Total Protein: 6.9 g/dL (ref 6.5–8.1)

## 2021-09-05 LAB — BRAIN NATRIURETIC PEPTIDE: B Natriuretic Peptide: 1700.3 pg/mL — ABNORMAL HIGH (ref 0.0–100.0)

## 2021-09-05 LAB — URINALYSIS, ROUTINE W REFLEX MICROSCOPIC
Bilirubin Urine: NEGATIVE
Glucose, UA: NEGATIVE mg/dL
Hgb urine dipstick: NEGATIVE
Ketones, ur: 5 mg/dL — AB
Leukocytes,Ua: NEGATIVE
Nitrite: POSITIVE — AB
Protein, ur: NEGATIVE mg/dL
Specific Gravity, Urine: 1.018 (ref 1.005–1.030)
Squamous Epithelial / HPF: NONE SEEN (ref 0–5)
pH: 6 (ref 5.0–8.0)

## 2021-09-05 LAB — APTT: aPTT: 39 seconds — ABNORMAL HIGH (ref 24–36)

## 2021-09-05 LAB — IRON AND TIBC
Iron: 15 ug/dL — ABNORMAL LOW (ref 28–170)
Saturation Ratios: 3 % — ABNORMAL LOW (ref 10.4–31.8)
TIBC: 476 ug/dL — ABNORMAL HIGH (ref 250–450)
UIBC: 461 ug/dL

## 2021-09-05 LAB — PROTIME-INR
INR: 1.2 (ref 0.8–1.2)
Prothrombin Time: 14.6 seconds (ref 11.4–15.2)

## 2021-09-05 LAB — RETICULOCYTES
Immature Retic Fract: 20.9 % — ABNORMAL HIGH (ref 2.3–15.9)
RBC.: 3.49 MIL/uL — ABNORMAL LOW (ref 3.87–5.11)
Retic Count, Absolute: 48.2 10*3/uL (ref 19.0–186.0)
Retic Ct Pct: 1.4 % (ref 0.4–3.1)

## 2021-09-05 LAB — PREPARE RBC (CROSSMATCH)

## 2021-09-05 LAB — FOLATE: Folate: 21.3 ng/mL (ref >5.9)

## 2021-09-05 LAB — FERRITIN: Ferritin: 6 ng/mL — ABNORMAL LOW (ref 11–307)

## 2021-09-05 LAB — VITAMIN B12: Vitamin B-12: 304 pg/mL (ref 180–914)

## 2021-09-05 MED ORDER — BUSPIRONE HCL 15 MG PO TABS
7.5000 mg | ORAL_TABLET | Freq: Two times a day (BID) | ORAL | Status: DC
  Administered 2021-09-05 – 2021-09-09 (×6): 7.5 mg via ORAL
  Filled 2021-09-05 (×8): qty 1

## 2021-09-05 MED ORDER — FERROUS SULFATE 325 (65 FE) MG PO TABS
325.0000 mg | ORAL_TABLET | Freq: Every day | ORAL | Status: DC
  Administered 2021-09-06: 325 mg via ORAL
  Filled 2021-09-05: qty 1

## 2021-09-05 MED ORDER — PANTOPRAZOLE SODIUM 40 MG IV SOLR
40.0000 mg | Freq: Two times a day (BID) | INTRAVENOUS | Status: DC
  Administered 2021-09-05 – 2021-09-09 (×7): 40 mg via INTRAVENOUS
  Filled 2021-09-05 (×7): qty 10

## 2021-09-05 MED ORDER — POLYVINYL ALCOHOL 1.4 % OP SOLN: 1.0000 [drp] | Freq: Two times a day (BID) | OPHTHALMIC | Status: DC | PRN

## 2021-09-05 MED ORDER — HYDROXYZINE HCL 10 MG PO TABS
10.0000 mg | ORAL_TABLET | Freq: Two times a day (BID) | ORAL | Status: DC | PRN
  Filled 2021-09-05: qty 1

## 2021-09-05 MED ORDER — SERTRALINE HCL 50 MG PO TABS
100.0000 mg | ORAL_TABLET | Freq: Every day | ORAL | Status: DC
  Administered 2021-09-06 – 2021-09-09 (×3): 100 mg via ORAL
  Filled 2021-09-05 (×3): qty 2

## 2021-09-05 MED ORDER — FUROSEMIDE 10 MG/ML IJ SOLN
20.0000 mg | Freq: Once | INTRAMUSCULAR | Status: AC
  Administered 2021-09-05: 20 mg via INTRAVENOUS
  Filled 2021-09-05: qty 4

## 2021-09-05 MED ORDER — CARVEDILOL 3.125 MG PO TABS
3.1250 mg | ORAL_TABLET | Freq: Two times a day (BID) | ORAL | Status: DC
  Administered 2021-09-06 – 2021-09-09 (×5): 3.125 mg via ORAL
  Filled 2021-09-05 (×5): qty 1

## 2021-09-05 MED ORDER — LORAZEPAM 2 MG/ML IJ SOLN
0.5000 mg | Freq: Once | INTRAMUSCULAR | Status: AC
  Administered 2021-09-06: 0.5 mg via INTRAVENOUS
  Filled 2021-09-05: qty 1

## 2021-09-05 MED ORDER — DM-GUAIFENESIN ER 30-600 MG PO TB12
1.0000 | ORAL_TABLET | Freq: Two times a day (BID) | ORAL | Status: DC | PRN
Start: 2021-09-05 — End: 2021-09-09
  Filled 2021-09-05: qty 1

## 2021-09-05 MED ORDER — ATORVASTATIN CALCIUM 20 MG PO TABS
20.0000 mg | ORAL_TABLET | Freq: Every day | ORAL | Status: DC
  Administered 2021-09-06 – 2021-09-09 (×3): 20 mg via ORAL
  Filled 2021-09-05 (×4): qty 1

## 2021-09-05 MED ORDER — HYDRALAZINE HCL 20 MG/ML IJ SOLN: 5.0000 mg | INTRAMUSCULAR | Status: DC | PRN

## 2021-09-05 MED ORDER — DIPHENHYDRAMINE HCL 25 MG PO CAPS
25.0000 mg | ORAL_CAPSULE | Freq: Every evening | ORAL | Status: DC | PRN
  Administered 2021-09-05: 25 mg via ORAL
  Filled 2021-09-05: qty 1

## 2021-09-05 MED ORDER — SODIUM CHLORIDE 0.9 % IV SOLN: 10.0000 mL/h | Freq: Once | INTRAVENOUS | Status: DC

## 2021-09-05 MED ORDER — ACETAMINOPHEN 325 MG PO TABS: 650.0000 mg | ORAL_TABLET | Freq: Four times a day (QID) | ORAL | Status: DC | PRN

## 2021-09-05 MED ORDER — CEFTRIAXONE SODIUM 1 G IJ SOLR
1.0000 g | INTRAMUSCULAR | Status: DC
  Administered 2021-09-06: 1 g via INTRAVENOUS
  Filled 2021-09-05 (×2): qty 10

## 2021-09-05 MED ORDER — ONDANSETRON HCL 4 MG/2ML IJ SOLN: 4.0000 mg | Freq: Three times a day (TID) | INTRAMUSCULAR | Status: DC | PRN

## 2021-09-05 MED ORDER — OXYCODONE HCL 5 MG PO TABS: 5.0000 mg | ORAL_TABLET | ORAL | Status: DC | PRN

## 2021-09-05 MED ORDER — ASCORBIC ACID 500 MG PO TABS
250.0000 mg | ORAL_TABLET | Freq: Two times a day (BID) | ORAL | Status: DC
  Administered 2021-09-05 – 2021-09-09 (×6): 250 mg via ORAL
  Filled 2021-09-05 (×6): qty 1

## 2021-09-05 MED ORDER — SENNOSIDES-DOCUSATE SODIUM 8.6-50 MG PO TABS
2.0000 | ORAL_TABLET | Freq: Two times a day (BID) | ORAL | Status: DC
  Administered 2021-09-05 – 2021-09-06 (×3): 2 via ORAL
  Filled 2021-09-05 (×5): qty 2

## 2021-09-05 MED ORDER — FUROSEMIDE 20 MG PO TABS
20.0000 mg | ORAL_TABLET | Freq: Every day | ORAL | Status: DC
  Administered 2021-09-06 – 2021-09-09 (×3): 20 mg via ORAL
  Filled 2021-09-05 (×3): qty 1

## 2021-09-05 MED ORDER — NICOTINE 21 MG/24HR TD PT24
21.0000 mg | MEDICATED_PATCH | Freq: Every day | TRANSDERMAL | Status: DC
  Administered 2021-09-05 – 2021-09-09 (×5): 21 mg via TRANSDERMAL
  Filled 2021-09-05 (×5): qty 1

## 2021-09-05 MED ORDER — ALBUTEROL SULFATE (2.5 MG/3ML) 0.083% IN NEBU: 3.0000 mL | INHALATION_SOLUTION | RESPIRATORY_TRACT | Status: DC | PRN

## 2021-09-05 MED ORDER — ADULT MULTIVITAMIN W/MINERALS CH
1.0000 | ORAL_TABLET | Freq: Every day | ORAL | Status: DC
  Administered 2021-09-06 – 2021-09-09 (×3): 1 via ORAL
  Filled 2021-09-05 (×4): qty 1

## 2021-09-05 NOTE — Assessment & Plan Note (Signed)
Patient's hemoglobin dropped from 9.1-5.3.  Denies rectal bleeding or dark stool.  Patient is taking Plavix.  Patient has history of upper GI bleeding.  Suspecting patient may have chronic blood loss from GI.  Consulted Dr. Alice Reichert of GI. ? ? ?- will admitted to med-surg bed as inpatient ?- hold Plavix ?- transfuse 2 units of blood now ?- Start IV pantoprazole 40 mg bid ?- Zofran IV for nausea ?- Avoid NSAIDs and SQ heparin ?- Maintain IV access (2 large bore IVs if possible). ?- Monitor closely and follow q6h cbc, transfuse as necessary, if Hgb<7.0 ?- [facility]: INR, PTT and type screen ? ? ? ? ?

## 2021-09-05 NOTE — Assessment & Plan Note (Signed)
-   IV hydralazine as needed ?-Hold lisinopril in the setting of severe anemia ?-Coreg ?-Patient is on Lasix for CHF ?

## 2021-09-05 NOTE — Assessment & Plan Note (Signed)
Lipitor 

## 2021-09-05 NOTE — Assessment & Plan Note (Signed)
No chest pain ?-Hold Plavix ?-Continue Lipitor ?

## 2021-09-05 NOTE — Assessment & Plan Note (Signed)
Stable -Bronchodilators 

## 2021-09-05 NOTE — Assessment & Plan Note (Signed)
On Protonix 

## 2021-09-05 NOTE — Consult Note (Signed)
? ? ?[facility] GI Inpatient Consult Note ? ? ?Kathline Magic, M.D. ? ?Reason for Consult: Symptomatic anemia, history of peptic ulcer disease, GERD ?  ?Attending Requesting Consult: Ivor Costa, M.D. ? ?Outpatient Primary Physician: Dion Body, M.D. ? ?History of Present Illness: ?Michele Matthews is a 76 y.o. female with history of COPD, chronic tobacco abuse, congestive heart failure, stroke on Plavix, chronic iron deficiency anemia and history of peptic ulcer disease presenting today with symptoms of fatigue and dyspnea on exertion.  This is been progressive over the last 2 weeks with no symptoms of melena, hematemesis or hematochezia.  Patient complains of only recurrent liquid brown stools. She does take Plavix daily for history of cerebrovascular accident.  She denies any current chest pain, syncopal episodes, recent seizures or strokes.  She is having some numbness in the left upper extremity but otherwise denies any other neurologic symptoms.  She denies any headache. ?Patient has a history of colonoscopy March 28, 2012 by Dr. Dionne Milo revealing diverticulosis in the left and transverse colon as well as a small 3 mm polyp (pathology not available).  Internal hemorrhoids were also noted.  Dr. Dionne Milo also performed an EGD showing mild gastritis and a hiatal hernia but was otherwise negative. ? ?The patient was admitted to [facility] in 2016 and underwent an upper endoscopy by Dr. Lucilla Lame revealing a nonbleeding white-based gastric ulcer and some gastritis but no active bleeding noted.  Repeat inpatient endoscopy was performed in 2017 at [facility] revealing distal esophageal stricture, hiatal hernia and gastritis.  Biopsies of the stricture were benign.  Stomach biopsies revealed also no evidence of intestinal metaplasia or malignancy. ? ?Past Medical History:  ?Past Medical History:  ?Diagnosis Date  ? Allergy   ? COPD (chronic obstructive pulmonary  disease) ([place])   ? Coronary artery disease   ? Depression   ? Emphysema of lung ([place])   ? GERD (gastroesophageal reflux disease)   ? Hyperlipidemia   ? Hypertension   ? Seizures ([place])   ? Stroke [facility])   ?  ?Problem List: ?Patient Active Problem List  ? Diagnosis Date Noted  ? UTI (urinary tract infection) 09/05/2021  ? Chronic combined systolic and diastolic heart failure ([place]) 09/05/2021  ? Hypokalemia 11/28/2020  ? Hyponatremia 11/28/2020  ? Left foot infection 11/25/2020  ? Chronic combined systolic and diastolic CHF (congestive heart failure) ([place]) 07/24/2017  ? Severe protein-calorie malnutrition ([place]) 04/01/2016  ? Anemia, iron deficiency   ? Pressure ulcer 12/27/2015  ? Essential hypertension 12/27/2015  ? Anemia 10/26/2015  ? COPD (chronic obstructive pulmonary disease) ([place]) 06/19/2015  ? Coronary artery disease 06/19/2015  ? GERD (gastroesophageal reflux disease) 06/19/2015  ? Hyperlipidemia, unspecified 06/19/2015  ? Long term current use of anticoagulant therapy 06/19/2015  ? Anxiety and depression 06/19/2015  ? Symptomatic anemia 12/10/2014  ? Upper GI bleed   ? History of CVA with residual deficit 12/10/2013  ? Hemianopsia 12/10/2013  ? Left-sided weakness 12/10/2013  ? Tobacco abuse 12/10/2013  ?  ?Past Surgical History: ?Past Surgical History:  ?Procedure Laterality Date  ? ESOPHAGOGASTRODUODENOSCOPY N/A 08/10/2015  ? Procedure: ESOPHAGOGASTRODUODENOSCOPY (EGD);  Surgeon: Manya Silvas, MD;  Location: [facility] ENDOSCOPY;  Service: Endoscopy;  Laterality: N/A;  ? ESOPHAGOGASTRODUODENOSCOPY (EGD) WITH PROPOFOL N/A 12/11/2014  ? Procedure: ESOPHAGOGASTRODUODENOSCOPY (EGD) WITH PROPOFOL;  Surgeon: Lucilla Lame, MD;  Location: [facility] ENDOSCOPY;  Service: Endoscopy;  Laterality: N/A;  ? ESOPHAGOGASTRODUODENOSCOPY (EGD) WITH PROPOFOL N/A 12/29/2015  ? Procedure:  ESOPHAGOGASTRODUODENOSCOPY (EGD) WITH PROPOFOL;  Surgeon: Mauri Pole, MD;  Location: [facility] ENDOSCOPY;  Service: Endoscopy;  Laterality: N/A;  ? LOWER  EXTREMITY ANGIOGRAPHY Left 11/25/2020  ? Procedure: Lower Extremity Angiography;  Surgeon: Algernon Huxley, MD;  Location: [place] CV LAB;  Service: Cardiovascular;  Laterality: Left;  ? LOWER EXTREMITY ANGIOGRAPHY Left 11/30/2020  ? Procedure: Lower Extremity Angiography;  Surgeon: Algernon Huxley, MD;  Location: [place] CV LAB;  Service: Cardiovascular;  Laterality: Left;  ? TRANSMETATARSAL AMPUTATION Left 12/02/2020  ? Procedure: TRANSMETATARSAL AMPUTATION;  Surgeon: Caroline More, DPM;  Location: [facility] ORS;  Service: Podiatry;  Laterality: Left;  ?  ?Allergies: ?No Known Allergies  ?Home Medications: ?Medications Prior to Admission  ?Medication Sig Dispense Refill Last Dose  ? ascorbic acid (VITAMIN C) 250 MG tablet Take 1 tablet (250 mg total) by mouth 2 (two) times daily.   09/04/2021 at 1600  ? atorvastatin (LIPITOR) 10 MG tablet TAKE 1 TABLET EVERY DAY 90 tablet 0 09/04/2021 at 0800  ? atorvastatin (LIPITOR) 20 MG tablet Take 20 mg by mouth daily.   09/04/2021  ? busPIRone (BUSPAR) 7.5 MG tablet Take 1 tablet (7.5 mg total) by mouth 2 (two) times daily. 180 tablet 3 09/04/2021 at 1600  ? clopidogrel (PLAVIX) 75 MG tablet Take 75 mg by mouth daily.   09/04/2021  ? ferrous sulfate 325 (65 FE) MG tablet Take 1 tablet (325 mg total) by mouth daily with breakfast.  3 09/04/2021 at 0800  ? furosemide (LASIX) 20 MG tablet Take 1 tablet (20 mg total) by mouth daily. 30 tablet 1 09/04/2021 at 0800  ? hydrOXYzine (ATARAX/VISTARIL) 10 MG tablet Take 1 tablet (10 mg total) by mouth 2 (two) times daily as needed for anxiety (sleep). 60 tablet 11 09/04/2021 at 1600  ? lisinopril (ZESTRIL) 5 MG tablet Take 5 mg by mouth daily.   09/04/2021 at 0800  ? Multiple Vitamin (MULTIVITAMIN) tablet Take 1 tablet by mouth daily.   09/04/2021 at 0800  ? senna-docusate (SENOKOT-S) 8.6-50 MG tablet Take 2 tablets by mouth 2 (two) times daily.   09/04/2021  ? acetaminophen (TYLENOL) 500 MG tablet Take 2 tablets (1,000 mg total) by mouth 3 (three) times daily.  30 tablet 0   ? carvedilol (COREG) 3.125 MG tablet Take 1 tablet (3.125 mg total) by mouth 2 (two) times daily with a meal. 60 tablet 2  at 1600  ? diphenhydramine-acetaminophen (TYLENOL PM) 25-500 MG TABS tablet Take 1 tablet by mouth at bedtime as needed.     ? feeding supplement, ENSURE ENLIVE, (ENSURE ENLIVE) LIQD Take 237 mLs by mouth 3 (three) times daily between meals. 237 mL 12   ? nicotine (NICODERM CQ - DOSED IN MG/24 HOURS) 21 mg/24hr patch Place 1 patch (21 mg total) onto the skin daily. 28 patch 0   ? oxyCODONE (OXY IR/ROXICODONE) 5 MG immediate release tablet Take 1 tablet (5 mg total) by mouth every 4 (four) hours as needed for moderate pain. 8 tablet 0 prn at prn  ? pantoprazole (PROTONIX) 40 MG tablet TAKE 1 TABLET EVERY DAY (Patient not taking: Reported on 09/05/2021) 90 tablet 0 Not Taking  ? sertraline (ZOLOFT) 100 MG tablet Take 1 tablet (100 mg total) by mouth 2 (two) times daily. 180 tablet 3   ? Tetrahydrozoline HCl (VISINE OP) Place 1 drop into both eyes 2 (two) times daily as needed (dry eyes).   prn at prn  ? ?Home medication reconciliation was completed with the patient.  ? ?  Scheduled Inpatient Medications: ?  ? ascorbic acid  250 mg Oral BID  ? atorvastatin  20 mg Oral Daily  ? busPIRone  7.5 mg Oral BID  ? [START ON 09/06/2021] carvedilol  3.125 mg Oral BID WC  ? [START ON 09/06/2021] ferrous sulfate  325 mg Oral Q breakfast  ? furosemide  20 mg Intravenous Once  ? [START ON 09/06/2021] furosemide  20 mg Oral Daily  ? multivitamin with minerals  1 tablet Oral Daily  ? nicotine  21 mg Transdermal Daily  ? pantoprazole (PROTONIX) IV  40 mg Intravenous Q12H  ? senna-docusate  2 tablet Oral BID  ? [START ON 09/06/2021] sertraline  100 mg Oral Daily  ? ? ?Continuous Inpatient Infusions: ?  ? sodium chloride Stopped (09/05/21 1222)  ? cefTRIAXone (ROCEPHIN)  IV    ? ? ?PRN Inpatient Medications:  ?acetaminophen, albuterol, dextromethorphan-guaiFENesin, diphenhydrAMINE, hydrALAZINE, hydrOXYzine,  ondansetron (ZOFRAN) IV, oxyCODONE, polyvinyl alcohol ? ?Family History: ?family history includes Alzheimer's disease in her father; Breast cancer in her sister; Cancer in her mother; Lung cancer in her s

## 2021-09-05 NOTE — Assessment & Plan Note (Addendum)
-  Patient reports left arm numbness and right eye vision change.  CT head negative. ?- Hold Plavix ?- Lipitor ?- will get  MRI-brain to r/o new stroke ?

## 2021-09-05 NOTE — ED Provider Notes (Signed)
? ?[facility] ?Provider Note ? ? ? Event Date/Time  ? First MD Initiated Contact with Patient 09/05/21 1007   ?  (approximate) ? ? ?History  ? ?Weakness ? ? ?HPI ? ?Michele Matthews is a 76 y.o. female with past medical history of hypertension, hyperlipidemia, COPD, coronary disease, chronic anemia, here with generalized weakness, fatigue, difficulty walking.  The patient states that for the last several days, she has had progressively worsening generalized weakness.  She has had about 2 weeks of progressive decline, but this is fairly significantly worsened over just the last day or so.  She states she normally uses a walker since her prior stroke and has essentially not even be able to use this to get herself up today.  She has had some urinary frequency and urgency.  No flank pain.  Denies any medication changes.  Denies any blood in her stools.  She states she feels generally weak, lightheaded when she stands up, but otherwise "not too bad."  She does state that she has noticed some subjective increased numbness of the left arm as well as what she describes as a "black" spot in her right-sided vision.  She has a history of stroke with somewhat similar symptoms but states that resolved. ?  ? ? ?Physical Exam  ? ?Triage Vital Signs: ?ED Triage Vitals  ?Enc Vitals Group  ?   BP 09/05/21 0951 114/69  ?   Pulse Rate 09/05/21 0951 92  ?   Resp 09/05/21 0951 18  ?   Temp 09/05/21 0951 98.2 ?F (36.8 ?C)  ?   Temp src --   ?   SpO2 09/05/21 0951 100 %  ?   Weight 09/05/21 0951 158 lb (71.7 kg)  ?   Height 09/05/21 0951 '5\' 5"'$  (1.651 m)  ?   Head Circumference --   ?   Peak Flow --   ?   Pain Score 09/05/21 0948 7  ?   Pain Loc --   ?   Pain Edu? --   ?   Excl. in [place]? --   ? ? ?Most recent vital signs: ?Vitals:  ? 09/05/21 1727 09/05/21 1945  ?BP: (!) 121/59 114/67  ?Pulse: 95 (!) 106  ?Resp: 18 18  ?Temp: 97.8 ?F (36.6 ?C) 98.5 ?F (36.9 ?C)  ?SpO2: 95% 100%  ? ? ? ?General: Awake, no distress.   ?CV:  Good peripheral perfusion. Tachycardic.  ?Resp:  Normal effort. Lungs CTAB. ?Abd:  No distention. No TTP. ?Other:  Pallor noted.  ? ? ?ED Results / Procedures / Treatments  ? ?Labs ?(all labs ordered are listed, but only abnormal results are displayed) ?Labs Reviewed  ?BASIC METABOLIC PANEL - Abnormal; Notable for the following components:  ?    Result Value  ? CO2 21 (*)   ? Glucose, Bld 105 (*)   ? BUN 24 (*)   ? All other components within normal limits  ?CBC - Abnormal; Notable for the following components:  ? RBC 3.48 (*)   ? Hemoglobin 5.3 (*)   ? HCT 21.8 (*)   ? MCV 62.6 (*)   ? MCH 15.2 (*)   ? MCHC 24.3 (*)   ? RDW 21.7 (*)   ? nRBC 0.9 (*)   ? All other components within normal limits  ?URINALYSIS, ROUTINE W REFLEX MICROSCOPIC - Abnormal; Notable for the following components:  ? Color, Urine AMBER (*)   ? APPearance CLEAR (*)   ? Ketones,  ur 5 (*)   ? Nitrite POSITIVE (*)   ? Bacteria, UA FEW (*)   ? All other components within normal limits  ?BRAIN NATRIURETIC PEPTIDE - Abnormal; Notable for the following components:  ? B Natriuretic Peptide 1,700.3 (*)   ? All other components within normal limits  ?IRON AND TIBC - Abnormal; Notable for the following components:  ? Iron 15 (*)   ? TIBC 476 (*)   ? Saturation Ratios 3 (*)   ? All other components within normal limits  ?FERRITIN - Abnormal; Notable for the following components:  ? Ferritin 6 (*)   ? All other components within normal limits  ?RETICULOCYTES - Abnormal; Notable for the following components:  ? RBC. 3.49 (*)   ? Immature Retic Fract 20.9 (*)   ? All other components within normal limits  ?APTT - Abnormal; Notable for the following components:  ? aPTT 39 (*)   ? All other components within normal limits  ?CBC - Abnormal; Notable for the following components:  ? RBC 3.36 (*)   ? Hemoglobin 5.2 (*)   ? HCT 20.8 (*)   ? MCV 61.9 (*)   ? MCH 15.5 (*)   ? MCHC 25.0 (*)   ? RDW 21.2 (*)   ? nRBC 0.5 (*)   ? All other components within normal  limits  ?CULTURE, BLOOD (SINGLE)  ?URINE CULTURE  ?PROTIME-INR  ?HEPATIC FUNCTION PANEL  ?FOLATE  ?CBC  ?CBC  ?VITAMIN B12  ?CBG MONITORING, ED  ?TYPE AND SCREEN  ?PREPARE RBC (CROSSMATCH)  ? ? ? ?EKG ?Sinus tachycardia, ventricular rate 98.  PR 152, QRS 129, QTc 49.  No acute ST elevations or depressions.  Left bundle branch block. ? ? ?RADIOLOGY ?CT Head: [facility] ? ? ?I also independently reviewed and agree with radiologist interpretations. ? ? ?PROCEDURES: ? ?Critical Care performed: Yes ? ?.Critical Care ?Performed by: Duffy Bruce, MD ?Authorized by: Duffy Bruce, MD  ? ?Critical care provider statement:  ?  Critical care time (minutes):  30 ?  Critical care time was exclusive of:  Separately billable procedures and treating other patients ?  Critical care was necessary to treat or prevent imminent or life-threatening deterioration of the following conditions:  Cardiac failure, circulatory failure and respiratory failure ?  Critical care was time spent personally by me on the following activities:  Development of treatment plan with patient or surrogate, discussions with consultants, evaluation of patient's response to treatment, examination of patient, ordering and review of laboratory studies, ordering and review of radiographic studies, ordering and performing treatments and interventions, pulse oximetry, re-evaluation of patient's condition and review of old charts ? ? ? ?MEDICATIONS ORDERED IN ED: ?Medications  ?0.9 %  sodium chloride infusion (0 mL/hr Intravenous Hold 09/05/21 1222)  ?nicotine (NICODERM CQ - dosed in mg/24 hours) patch 21 mg (21 mg Transdermal Patch Applied 09/05/21 1814)  ?furosemide (LASIX) injection 20 mg (has no administration in time range)  ?ondansetron (ZOFRAN) injection 4 mg (has no administration in time range)  ?acetaminophen (TYLENOL) tablet 650 mg (has no administration in time range)  ?hydrALAZINE (APRESOLINE) injection 5 mg (has no administration in time range)  ?albuterol  (PROVENTIL) (2.5 MG/3ML) 0.083% nebulizer solution 3 mL (has no administration in time range)  ?dextromethorphan-guaiFENesin (MUCINEX DM) 30-600 MG per 12 hr tablet 1 tablet (has no administration in time range)  ?cefTRIAXone (ROCEPHIN) 1 g in sodium chloride 0.9 % 100 mL IVPB (has no administration in time range)  ?pantoprazole (PROTONIX)  injection 40 mg (40 mg Intravenous Given 09/05/21 1815)  ?oxyCODONE (Oxy IR/ROXICODONE) immediate release tablet 5 mg (has no administration in time range)  ?atorvastatin (LIPITOR) tablet 20 mg (20 mg Oral Not Given 09/05/21 1815)  ?carvedilol (COREG) tablet 3.125 mg (has no administration in time range)  ?furosemide (LASIX) tablet 20 mg (has no administration in time range)  ?busPIRone (BUSPAR) tablet 7.5 mg (has no administration in time range)  ?diphenhydrAMINE (BENADRYL) capsule 25 mg (has no administration in time range)  ?hydrOXYzine (ATARAX) tablet 10 mg (has no administration in time range)  ?sertraline (ZOLOFT) tablet 100 mg (has no administration in time range)  ?senna-docusate (Senokot-S) tablet 2 tablet (has no administration in time range)  ?ferrous sulfate tablet 325 mg (has no administration in time range)  ?ascorbic acid (VITAMIN C) tablet 250 mg (has no administration in time range)  ?multivitamin with minerals tablet 1 tablet (1 tablet Oral Not Given 09/05/21 1816)  ?polyvinyl alcohol (LIQUIFILM TEARS) 1.4 % ophthalmic solution 1 drop (has no administration in time range)  ?LORazepam (ATIVAN) injection 0.5 mg (has no administration in time range)  ? ? ? ?IMPRESSION / MDM / ASSESSMENT AND PLAN / ED COURSE  ?I reviewed the triage vital signs and the nursing notes. ?             ?               ? ? ?The patient is on the cardiac monitor to evaluate for evidence of arrhythmia and/or significant heart rate changes. ? ? ?Ddx:  ?Generalized weakness 2/2 occult infection such as UTI or PNA, anemia, dehydration/AKI, polypharmacy, ACS ? ? ?MDM:  ?76 yo F with PMHx HTN, HFrEF,  HLD, COPD, here with generalized weakness. Pt appears pale, dehydrated on exam but no focal deficits. DDx as above. Lab work, imaging reviewed. CBC shows significant acute on chronic microcytic anemia, with Hgb

## 2021-09-05 NOTE — Assessment & Plan Note (Signed)
2D echo on 07/24/2017 showed EF 25-30% with grade 2 diastolic dysfunction.  Patient does not have leg edema JVD.  CHF seem to be compensated. ?-Continue home Lasix 20 mg daily ?-Will give 40 mg of Lasix after finished first unit of blood transfusion ?

## 2021-09-05 NOTE — ED Triage Notes (Signed)
Pt via EMS from home. Pt c/o generalized weakness for the last 4 weeks. Pt also c/o headache that started this AM. Pt states that usually tylenol takes it away but it didn't this time. Pt is on Plavix. Denies any recent head injury. Pt is A&OX4 and NAD.  ?

## 2021-09-05 NOTE — Assessment & Plan Note (Signed)
-   Continue home medications 

## 2021-09-05 NOTE — Assessment & Plan Note (Signed)
Continue iron supplement.

## 2021-09-05 NOTE — Assessment & Plan Note (Signed)
-  Nicotine patch 

## 2021-09-05 NOTE — Assessment & Plan Note (Signed)
-   IV Rocephin -Follow-up urine culture 

## 2021-09-05 NOTE — H&P (Signed)
?History and Physical  ? ? ?Michele Matthews GXQ:[id number] DOB: 02/11/1946 DOA: 09/05/2021 ? ?Referring MD/NP/PA:  ? ?PCP: [place]  ? ?Patient coming from:  The patient is coming from home.  At baseline, pt is independent for most of ADL.       ? ?Chief Complaint: Generalized weakness, lightheadedness, ? ?HPI: Michele Matthews is a 76 y.o. female with medical history significant of iron deficiency anemia, upper GI bleeding, hypertension, hyperlipidemia, COPD, stroke, Plavix, GERD, depression, anxiety, tobacco abuse, CHF with EF 25-30%, who presents with generalized weakness, lightheadedness. ? ?Patient states that she has generalized weakness for several days, which has been progressively worsening.  She also has lightheadedness and dizziness.  No fall. ?Denies dark stool or rectal bleeding.  Denies nausea vomiting, diarrhea or abdominal pain.  Denies chest pain, cough, shortness of breath, no fever or chills. She reports dysuria, burning on urination and increased urinary frequency.  No flank pain. She also reports that she has increased numbness of the left arm as well as what she describes as a "black" spot in her right-sided vision.  No difficulty speaking.  No facial droop or slurred speech ? ?Data Reviewed and ED Course: pt was found to have hemoglobin 5.3 (9.1 on 12/03/2020), urinalysis (amber appearance, negative leukocyte, positive nitrite, few bacteria, WBC 0-5), INR 1.2, GFR> 60, temperature normal, blood pressure 116/68, heart rate 105, 94, RR 18, oxygen saturation 100% on room air.  CT of head is negative for acute intracranial abnormalities.  Patient is admitted to telemetry bed as inpatient.  Dr. Alice Reichert for GI is consulted. ? ? ?EKG: I have personally reviewed.  Sinus rhythm, QTc 489, poor R wave progression, multiple PVC, left bundle blockade, ST depression in V5-V6, in the inferior leads. ? ?Review of Systems:  ? ?General: no fevers, chills, no body weight gain, has fatigue ?HEENT: has vision  change, no sore throat. Has HOH ?Respiratory: no dyspnea, coughing, wheezing ?CV: no chest pain, no palpitations ?GI: no nausea, vomiting, abdominal pain, diarrhea, constipation ?GU: no dysuria, burning on urination, increased urinary frequency, hematuria  ?Ext: no leg edema ?Neuro: Has left arm numbness, right eye vision change, has lightheadedness ?Skin: no rash, no skin tear. ?MSK: No muscle spasm, no deformity, no limitation of range of movement in spin ?Heme: No easy bruising.  ?Travel history: No recent long distant travel. ? ? ?Allergy: No Known Allergies ? ?Past Medical History:  ?Diagnosis Date  ? Allergy   ? COPD (chronic obstructive pulmonary disease) ([place])   ? Coronary artery disease   ? Depression   ? Emphysema of lung ([place])   ? GERD (gastroesophageal reflux disease)   ? Hyperlipidemia   ? Hypertension   ? Seizures ([place])   ? Stroke [facility])   ? ? ?Past Surgical History:  ?Procedure Laterality Date  ? ESOPHAGOGASTRODUODENOSCOPY N/A 08/10/2015  ? Procedure: ESOPHAGOGASTRODUODENOSCOPY (EGD);  Surgeon: Manya Silvas, MD;  Location: [facility] ENDOSCOPY;  Service: Endoscopy;  Laterality: N/A;  ? ESOPHAGOGASTRODUODENOSCOPY (EGD) WITH PROPOFOL N/A 12/11/2014  ? Procedure: ESOPHAGOGASTRODUODENOSCOPY (EGD) WITH PROPOFOL;  Surgeon: Lucilla Lame, MD;  Location: [facility] ENDOSCOPY;  Service: Endoscopy;  Laterality: N/A;  ? ESOPHAGOGASTRODUODENOSCOPY (EGD) WITH PROPOFOL N/A 12/29/2015  ? Procedure: ESOPHAGOGASTRODUODENOSCOPY (EGD) WITH PROPOFOL;  Surgeon: Mauri Pole, MD;  Location: [facility] ENDOSCOPY;  Service: Endoscopy;  Laterality: N/A;  ? LOWER EXTREMITY ANGIOGRAPHY Left 11/25/2020  ? Procedure: Lower Extremity Angiography;  Surgeon: Algernon Huxley, MD;  Location: [place] CV LAB;  Service: Cardiovascular;  Laterality: Left;  ? LOWER EXTREMITY ANGIOGRAPHY Left 11/30/2020  ? Procedure: Lower Extremity Angiography;  Surgeon: Algernon Huxley, MD;  Location: [place] CV LAB;  Service: Cardiovascular;  Laterality: Left;  ?  TRANSMETATARSAL AMPUTATION Left 12/02/2020  ? Procedure: TRANSMETATARSAL AMPUTATION;  Surgeon: Caroline More, DPM;  Location: [facility] ORS;  Service: Podiatry;  Laterality: Left;  ? ? ?Social History:  reports that she has been smoking. She has been smoking an average of 1 pack per day. She uses smokeless tobacco. She reports that she does not drink alcohol and does not use drugs. ? ?Family History:  ?Family History  ?Problem Relation Age of Onset  ? Alzheimer's disease Father   ? Lung cancer Sister   ? Breast cancer Sister   ? Cancer Mother   ?  ? ?Prior to Admission medications   ?Medication Sig Start Date End Date Taking? Authorizing Provider  ?ascorbic acid (VITAMIN C) 250 MG tablet Take 1 tablet (250 mg total) by mouth 2 (two) times daily. 12/08/20  Yes Danford, Suann Larry, MD  ?atorvastatin (LIPITOR) 10 MG tablet TAKE 1 TABLET EVERY DAY 03/31/17  Yes Mikey College, NP  ?atorvastatin (LIPITOR) 20 MG tablet Take 20 mg by mouth daily. 08/26/21  Yes [provider]  ?busPIRone (BUSPAR) 7.5 MG tablet Take 1 tablet (7.5 mg total) by mouth 2 (two) times daily. 11/03/16  Yes Karamalegos, Devonne Doughty, DO  ?clopidogrel (PLAVIX) 75 MG tablet Take 75 mg by mouth daily. 11/16/20  Yes [provider]  ?ferrous sulfate 325 (65 FE) MG tablet Take 1 tablet (325 mg total) by mouth daily with breakfast. 12/09/20  Yes Danford, Suann Larry, MD  ?furosemide (LASIX) 20 MG tablet Take 1 tablet (20 mg total) by mouth daily. 07/27/17  Yes Gladstone Lighter, MD  ?hydrOXYzine (ATARAX/VISTARIL) 10 MG tablet Take 1 tablet (10 mg total) by mouth 2 (two) times daily as needed for anxiety (sleep). 11/03/16  Yes Karamalegos, Devonne Doughty, DO  ?lisinopril (ZESTRIL) 5 MG tablet Take 5 mg by mouth daily. 10/08/20  Yes [provider]  ?Multiple Vitamin (MULTIVITAMIN) tablet Take 1 tablet by mouth daily.   Yes [provider]  ?senna-docusate (SENOKOT-S) 8.6-50 MG tablet Take 2 tablets by mouth 2 (two) times daily.  12/08/20  Yes Danford, Suann Larry, MD  ?acetaminophen (TYLENOL) 500 MG tablet Take 2 tablets (1,000 mg total) by mouth 3 (three) times daily. 12/08/20   Danford, Suann Larry, MD  ?carvedilol (COREG) 3.125 MG tablet Take 1 tablet (3.125 mg total) by mouth 2 (two) times daily with a meal. 07/27/17   Gladstone Lighter, MD  ?diphenhydramine-acetaminophen (TYLENOL PM) 25-500 MG TABS tablet Take 1 tablet by mouth at bedtime as needed.    [provider]  ?feeding supplement, ENSURE ENLIVE, (ENSURE ENLIVE) LIQD Take 237 mLs by mouth 3 (three) times daily between meals. 07/27/17   Gladstone Lighter, MD  ?nicotine (NICODERM CQ - DOSED IN MG/24 HOURS) 21 mg/24hr patch Place 1 patch (21 mg total) onto the skin daily. 12/09/20   Danford, Suann Larry, MD  ?oxyCODONE (OXY IR/ROXICODONE) 5 MG immediate release tablet Take 1 tablet (5 mg total) by mouth every 4 (four) hours as needed for moderate pain. 12/08/20   Danford, Suann Larry, MD  ?pantoprazole (PROTONIX) 40 MG tablet TAKE 1 TABLET EVERY DAY ?Patient not taking: Reported on 09/05/2021 03/31/17   Mikey College, NP  ?sertraline (ZOLOFT) 100 MG tablet Take 1 tablet (100 mg total) by mouth 2 (two) times daily. 07/15/16  Olin Hauser, DO  ?Tetrahydrozoline HCl (VISINE OP) Place 1 drop into both eyes 2 (two) times daily as needed (dry eyes).    [provider]  ? ? ?Physical Exam: ?Vitals:  ? 09/05/21 1500 09/05/21 1617 09/05/21 1700 09/05/21 1727  ?BP: (!) 110/59 126/60 124/69 (!) 121/59  ?Pulse: 95 (!) 103 96 95  ?Resp: '20 18 16 18  '$ ?Temp: 98.5 ?F (36.9 ?C) 97.6 ?F (36.4 ?C) 98.2 ?F (36.8 ?C) 97.8 ?F (36.6 ?C)  ?TempSrc: Oral Oral  Oral  ?SpO2: 100% 93% 98% 95%  ?Weight:      ?Height:      ? ?General: Not in acute distress. Pale looking ?HEENT: ?      Eyes: PERRL, EOMI, no scleral icterus. ?      ENT: No discharge from the ears and nose, no pharynx injection, no tonsillar enlargement.  ?      Neck: No JVD, no bruit, no mass felt. ?Heme: No  neck lymph node enlargement. ?Cardiac: S1/S2, RRR, No murmurs, No gallops or rubs. ?Respiratory: No rales, wheezing, rhonchi or rubs. ?GI: Soft, nondistended, nontender, no rebound pain, no organomegaly, BS

## 2021-09-05 NOTE — ED Notes (Addendum)
Dr. Ellender Hose at bedside for evaluation.  ?

## 2021-09-06 ENCOUNTER — Inpatient Hospital Stay: Payer: Medicare HMO

## 2021-09-06 DIAGNOSIS — D649 Anemia, unspecified: Secondary | ICD-10-CM | POA: Diagnosis not present

## 2021-09-06 LAB — CBC
HCT: 31.5 % — ABNORMAL LOW (ref 36.0–46.0)
HCT: 32.2 % — ABNORMAL LOW (ref 36.0–46.0)
Hemoglobin: 9.2 g/dL — ABNORMAL LOW (ref 12.0–15.0)
Hemoglobin: 9.5 g/dL — ABNORMAL LOW (ref 12.0–15.0)
MCH: 20.6 pg — ABNORMAL LOW (ref 26.0–34.0)
MCH: 20.5 pg — ABNORMAL LOW (ref 26.0–34.0)
MCHC: 29.2 g/dL — ABNORMAL LOW (ref 30.0–36.0)
MCHC: 29.5 g/dL — ABNORMAL LOW (ref 30.0–36.0)
MCV: 70.6 fL — ABNORMAL LOW (ref 80.0–100.0)
MCV: 69.5 fL — ABNORMAL LOW (ref 80.0–100.0)
Platelets: 186 10*3/uL (ref 150–400)
Platelets: 209 10*3/uL (ref 150–400)
RBC: 4.46 MIL/uL (ref 3.87–5.11)
RBC: 4.63 MIL/uL (ref 3.87–5.11)
RDW: 28.5 % — ABNORMAL HIGH (ref 11.5–15.5)
RDW: 28.8 % — ABNORMAL HIGH (ref 11.5–15.5)
WBC: 7.1 10*3/uL (ref 4.0–10.5)
WBC: 7.1 10*3/uL (ref 4.0–10.5)
nRBC: 0.4 % — ABNORMAL HIGH (ref 0.0–0.2)
nRBC: 0.4 % — ABNORMAL HIGH (ref 0.0–0.2)

## 2021-09-06 LAB — GLUCOSE, CAPILLARY: Glucose-Capillary: 104 mg/dL — ABNORMAL HIGH (ref 70–99)

## 2021-09-06 MED ORDER — CEPHALEXIN 500 MG PO CAPS
500.0000 mg | ORAL_CAPSULE | Freq: Two times a day (BID) | ORAL | Status: DC
Start: 2021-09-06 — End: 2021-09-06

## 2021-09-06 MED ORDER — PEG 3350-KCL-NA BICARB-NACL 420 G PO SOLR
4000.0000 mL | Freq: Once | ORAL | Status: AC
  Administered 2021-09-06: 4000 mL via ORAL
  Filled 2021-09-06: qty 4000

## 2021-09-06 MED ORDER — BISACODYL 5 MG PO TBEC
20.0000 mg | DELAYED_RELEASE_TABLET | Freq: Once | ORAL | Status: AC
  Administered 2021-09-06: 20 mg via ORAL
  Filled 2021-09-06: qty 4

## 2021-09-06 MED ORDER — CEPHALEXIN 500 MG PO CAPS
500.0000 mg | ORAL_CAPSULE | Freq: Two times a day (BID) | ORAL | Status: DC
  Administered 2021-09-06 – 2021-09-09 (×4): 500 mg via ORAL
  Filled 2021-09-06 (×4): qty 1

## 2021-09-06 MED ORDER — CEPHALEXIN 500 MG PO CAPS: 500.0000 mg | ORAL_CAPSULE | Freq: Two times a day (BID) | ORAL | Status: DC

## 2021-09-06 MED ORDER — LISINOPRIL 5 MG PO TABS
5.0000 mg | ORAL_TABLET | Freq: Every day | ORAL | Status: DC
Start: 2021-09-06 — End: 2021-09-09
  Administered 2021-09-06 – 2021-09-09 (×3): 5 mg via ORAL
  Filled 2021-09-06 (×3): qty 1

## 2021-09-06 MED ORDER — ATORVASTATIN CALCIUM 10 MG PO TABS: 10.0000 mg | ORAL_TABLET | Freq: Every day | ORAL | Status: DC

## 2021-09-06 MED ORDER — SODIUM CHLORIDE 0.9 % IV SOLN
200.0000 mg | INTRAVENOUS | Status: AC
  Administered 2021-09-06 – 2021-09-08 (×3): 200 mg via INTRAVENOUS
  Filled 2021-09-06: qty 200
  Filled 2021-09-06: qty 10
  Filled 2021-09-06: qty 200

## 2021-09-06 MED ORDER — FERROUS SULFATE 325 (65 FE) MG PO TABS
325.0000 mg | ORAL_TABLET | Freq: Two times a day (BID) | ORAL | Status: DC
  Administered 2021-09-06 – 2021-09-09 (×3): 325 mg via ORAL
  Filled 2021-09-06 (×3): qty 1

## 2021-09-06 NOTE — Progress Notes (Signed)
PT Cancellation Note ? ?Patient Details ?Name: Michele Matthews ?MRN: [id number] ?DOB: October 31, 1945 ? ? ?Cancelled Treatment:    Reason Eval/Treat Not Completed: Patient declined, no reason specified. Orders received and chart reviewed. Upon entry to room pt declining participation until after EGD tomorrow. Will re-attempt at a later time/date as able and medically appropriate. ? ? ?[name]. Fairly IV, PT, DPT ?Physical Therapist- [place]  ?[facility]  ?09/06/2021, 1:16 PM ?

## 2021-09-06 NOTE — Progress Notes (Signed)
[place] at [facility] ? ? ?PATIENT NAME: Michele Matthews   ? ?MR#:  [id number] ? ?DATE OF BIRTH:  01/02/1946 ? ?SUBJECTIVE:  ?patient came in with increasing shortness of breath for last three weeks. C/o increased tiredness and fatiguability. ?Patient came in with hemoglobin of 5.2. Got two unit blood transfusion. Denies any active bleeding. Has history of iron deficiency anemia. ? ? ?VITALS:  ?Blood pressure 138/71, pulse (!) 105, temperature 98.1 ?F (36.7 ?C), resp. rate 16, height '5\' 5"'$  (1.651 m), weight 71.7 kg, SpO2 98 %. ? ?PHYSICAL EXAMINATION:  ? ?GENERAL:  76 y.o.-year-old patient lying in the bed with no acute distress. Pallor+ ?LUNGS: Normal breath sounds bilaterally, no wheezing, rales, rhonchi.  ?CARDIOVASCULAR: S1, S2 normal. No murmurs, rubs, or gallops.  ?ABDOMEN: Soft, nontender, nondistended. Bowel sounds present.  ?EXTREMITIES: No  edema b/l.    ?NEUROLOGIC: nonfocal  patient is alert and awake, weak ?SKIN: No obvious rash, lesion, or ulcer.  ? ?LABORATORY PANEL:  ?CBC ?Recent Labs  ?Lab 09/06/21 ?0543  ?WBC 7.1  ?HGB 9.5*  ?HCT 32.2*  ?PLT 209  ? ? ?Chemistries  ?Recent Labs  ?Lab 09/05/21 ?5852 09/05/21 ?7782  ?NA  --  139  ?K  --  4.2  ?CL  --  107  ?CO2  --  21*  ?GLUCOSE  --  105*  ?BUN  --  24*  ?CREATININE  --  0.96  ?CALCIUM  --  9.3  ?AST 34  --   ?ALT 12  --   ?ALKPHOS 54  --   ?BILITOT 0.8  --   ? ?Cardiac Enzymes ?No results for input(s): TROPONINI in the last 168 hours. ?RADIOLOGY:  ?CT HEAD WO CONTRAST (5MM) ? ?Result Date: 09/05/2021 ?CLINICAL DATA:  Generalized weakness 1 month with headache starting this morning. EXAM: CT HEAD WITHOUT CONTRAST TECHNIQUE: Contiguous axial images were obtained from the base of the skull through the vertex without intravenous contrast. RADIATION DOSE REDUCTION: This exam was performed according to the departmental dose-optimization program which includes automated exposure control, adjustment of the mA and/or kV according to  patient size and/or use of iterative reconstruction technique. COMPARISON:  12/27/2015 FINDINGS: Brain: Ventricles and cisterns are within normal. CSF spaces are unremarkable. Evidence of patient's known old right MCA territory infarct and old left occipital infarct. No mass, mass effect, shift of midline structures or acute hemorrhage. No evidence of acute infarction. Vascular: No hyperdense vessel or unexpected calcification. Skull: Normal. Negative for fracture or focal lesion. Sinuses/Orbits: No acute finding. Other: None. IMPRESSION: 1. No acute findings. 2. Old bilateral infarcts as described. Electronically Signed   By: Marin Olp M.D.   On: 09/05/2021 13:05  ? ?MR BRAIN WO CONTRAST ? ?Result Date: 09/06/2021 ?CLINICAL DATA:  Acute neurologic deficit acute neurologic deficit EXAM: MRI HEAD WITHOUT CONTRAST TECHNIQUE: Multiplanar, multiecho pulse sequences of the brain and surrounding structures were obtained without intravenous contrast. COMPARISON:  12/10/2014 FINDINGS: Brain: Large, old infarcts within the right MCA and left PCA territories. No acute infarct. No acute or chronic hemorrhage. There is multifocal hyperintense T2-weighted signal within the white matter. Generalized cerebral volume loss. The midline structures are normal. Vascular: Major flow voids are preserved. Skull and upper cervical spine: Normal calvarium and skull base. Visualized upper cervical spine and soft tissues are normal. Sinuses/Orbits:No paranasal sinus fluid levels or advanced mucosal thickening. No mastoid or middle ear effusion. Normal orbits. IMPRESSION: 1. No acute intracranial abnormality. 2. Large, old infarcts within  the right MCA and left PCA territories. Electronically Signed   By: Ulyses Jarred M.D.   On: 09/06/2021 02:04   ? ?Assessment and Plan ?Michele Matthews is a 76 y.o. female with medical history significant of iron deficiency anemia, upper GI bleeding, hypertension, hyperlipidemia, COPD, stroke, Plavix, GERD,  depression, anxiety, tobacco abuse, CHF with EF 25-30%, who presents with generalized weakness, lightheadedness. ?  ?Patient states that she has generalized weakness for several days, which has been progressively worsening.  She also has lightheadedness and dizziness.  No fall. ? ?Acute on chronic iron deficiency anemia/symptomatic ?-- comes in with increasing shortness of breath  ?-- hgb 9.1 ?--came in with hgb with 5.2--2 units BT--9.5 ?-- G.I. consultation with Dr. Alice Reichert. Plans for EGD colonoscopy tomorrow ?-- hold Plavix ?-- IV iron infusion. Will start PO iron supplement ?-- patient will follow-up with Dr. Tasia Catchings hematology after discharge. Dr. Tasia Catchings aware ?-- IV Protonix ? 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  Patient does not have leg edema JVD.  CHF seem to be compensated. ?-Continue home Lasix 20 mg daily ?-Will give 40 mg of Lasix after finished first unit of blood transfusion ?  ?UTI (urinary tract infection) ?- IV Rocephin--change to po kelfex, no symptoms ?-Follow-up urine culture ?  ?Essential hypertension ?- IV hydralazine as needed ?-Coreg and lisinopril ?-Patient is on Lasix for CHF ?   ?Tobacco abuse ?- Nicotine patch ?-- advised smoking cessation ?  ?Hyperlipidemia, unspecified ?- Lipitor ?  ?GERD (gastroesophageal reflux disease) ?- On Protonix ?  ?History of CVA with residual deficit ?-Patient reports left arm numbness and right eye vision change.  CT head negative. ?- Hold Plavix ?- Lipitor ?-  MRI-negative for stroke. Shows old stroke ?  ?  ?COPD (chronic obstructive pulmonary disease) ([place]) ?Stable ?-Bronchodilators ? ? ?Procedures: ?Family communication : husband on the phone ?Consults : G.I. Dr. Alice Reichert ?CODE STATUS: full ?DVT Prophylaxis : SCD ?Level of care: Med-Surg ?Status is: Inpatient ?Remains inpatient appropriate because: anemia workup ?  ? ?TOTAL TIME TAKING CARE OF THIS PATIENT: 30  minutes.  ?>50% time spent on counselling and coordination of care ? ?Note: This dictation was prepared with Dragon dictation along with smaller phrase technology. Any transcriptional errors that result from this process are unintentional. ? ?Fritzi Mandes M.D  ? ? ?[facility]  ? ?CC: ?Primary care physician; [place]  ?

## 2021-09-06 NOTE — Progress Notes (Addendum)
? ?GI Inpatient Follow-up Note ? ?Subjective: ? ?Patient seen in follow-up for symptomatic anemia. No acute events overnight. Hemoglobin improved to 9.5 this morning after transfusion yesterday. No bloody or tarry stools. She denies any chest pain, shortness of breath, nausea, or vomiting. No new complaints or concerns.  ? ?Scheduled Inpatient Medications:  ? ascorbic acid  250 mg Oral BID  ? atorvastatin  20 mg Oral Daily  ? busPIRone  7.5 mg Oral BID  ? carvedilol  3.125 mg Oral BID WC  ? ferrous sulfate  325 mg Oral Q breakfast  ? furosemide  20 mg Oral Daily  ? multivitamin with minerals  1 tablet Oral Daily  ? nicotine  21 mg Transdermal Daily  ? pantoprazole (PROTONIX) IV  40 mg Intravenous Q12H  ? senna-docusate  2 tablet Oral BID  ? sertraline  100 mg Oral Daily  ? ? ?Continuous Inpatient Infusions: ?  ? sodium chloride Stopped (09/05/21 1222)  ? cefTRIAXone (ROCEPHIN)  IV Stopped (09/06/21 0231)  ? ? ?PRN Inpatient Medications:  ?acetaminophen, albuterol, dextromethorphan-guaiFENesin, diphenhydrAMINE, hydrALAZINE, hydrOXYzine, ondansetron (ZOFRAN) IV, oxyCODONE, polyvinyl alcohol ? ?Review of Systems: ?Constitutional: Weight is stable.  ?Eyes: No changes in vision. ?ENT: No oral lesions, sore throat.  ?GI: see HPI.  ?Heme/Lymph: No easy bruising.  ?CV: No chest pain.  ?GU: No hematuria.  ?Integumentary: No rashes.  ?Neuro: No headaches.  ?Psych: No depression/anxiety.  ?Endocrine: No heat/cold intolerance.  ?Allergic/Immunologic: No urticaria.  ?Resp: No cough, SOB.  ?Musculoskeletal: No joint swelling.  ?  ?Physical Examination: ?BP 138/71 (BP Location: Left Arm)   Pulse (!) 105   Temp 98.1 ?F (36.7 ?C)   Resp 16   Ht '5\' 5"'$  (1.651 m)   Wt 71.7 kg   SpO2 98%   BMI 26.29 kg/m?  ?Gen: NAD, alert and oriented x 4 ?HEENT: PEERLA, EOMI, hard of hearing  ?Neck: supple, no JVD or thyromegaly ?Chest: CTA bilaterally, no wheezes, crackles, or other adventitious sounds ?CV: RRR, no m/g/c/r ?Abd: soft, NT, ND,  +BS in all four quadrants; no HSM, guarding, ridigity, or rebound tenderness ?Ext: no edema, well perfused with 2+ pulses, ?Skin: no rash or lesions noted ?Lymph: no LAD ? ?Data: ?Lab Results  ?Component Value Date  ? WBC 7.1 09/06/2021  ? HGB 9.5 (L) 09/06/2021  ? HCT 32.2 (L) 09/06/2021  ? MCV 69.5 (L) 09/06/2021  ? PLT 209 09/06/2021  ? ?Recent Labs  ?Lab 09/05/21 ?1650 09/06/21 ?0229 09/06/21 ?0543  ?HGB 5.2* 9.2* 9.5*  ? ?Lab Results  ?Component Value Date  ? NA 139 09/05/2021  ? K 4.2 09/05/2021  ? CL 107 09/05/2021  ? CO2 21 (L) 09/05/2021  ? BUN 24 (H) 09/05/2021  ? CREATININE 0.96 09/05/2021  ? ?Lab Results  ?Component Value Date  ? ALT 12 09/05/2021  ? AST 34 09/05/2021  ? ALKPHOS 54 09/05/2021  ? BILITOT 0.8 09/05/2021  ? ?Recent Labs  ?Lab 09/05/21 ?2376 09/05/21 ?1650  ?APTT  --  39*  ?INR 1.2  --   ? ?Assessment/Plan: ? ?76 y/o Caucasian female with a PMH of HTN, HLD, COPD, Hx of CVA on Plavix, GERD, depression and anxiety, tobacco abuse, CHF with reduced EF 25-30%, IDA, Hx of UGIB 2/2 PUD admitted to [facility] for symptomatic anemia ? ?Symptomatic anemia/Iron-deficiency anemia ? ?Hx of PUD - EGD 2016 with Dr. Allen Norris showed non-bleeding gastric ulcer ? ?GERD ? ?Colonic diverticulosis - seen on CSY 2013 ? ?CHF with reduced EF 25-30% ? ?  Hx of CVA - Plavix on hold ? ?PVD w/ hx of LE stent  ? ?Tobacco abuse ? ?Recommendations: ? ?- Continue to monitor serial H&H. Transfuse for Hgb <7.0.  ?- Continue IV acid suppression ?- Continue to hold Plavix as deemed medically feasible ?- Consider IV iron infusion today. Discussed with Dr. Posey Pronto.  ?- Will plan for EGD and colonoscopy tomorrow with Dr. Alice Reichert for luminal evaluation. ?- Clear liquid diet today. NPO after midnight. Bowel prep orders in. ?- See procedure note for findings and further recommendations ? ?I reviewed the risks (including bleeding, perforation, infection, anesthesia complications, cardiac/respiratory complications), benefits and  alternatives of EGD and colonoscopy. Patient consents to proceed.  ? ? ?Please call with questions or concerns. ? ? ? ?Octavia Bruckner, PA-C ?[facility] ?[phone] ? ? ? ? ?

## 2021-09-06 NOTE — Plan of Care (Signed)

## 2021-09-07 ENCOUNTER — Encounter
Admission: EM | Disposition: A | Discharge status: Home Health | Payer: Self-pay | Source: Home / Self Care | Attending: Internal Medicine

## 2021-09-07 DIAGNOSIS — D649 Anemia, unspecified: Secondary | ICD-10-CM | POA: Diagnosis not present

## 2021-09-07 SURGERY — COLONOSCOPY
Anesthesia: General

## 2021-09-07 MED ORDER — POLYETHYLENE GLYCOL 3350 17 GM/SCOOP PO POWD
1.0000 | Freq: Once | ORAL | Status: AC
  Administered 2021-09-07: 255 g via ORAL
  Filled 2021-09-07: qty 255

## 2021-09-07 NOTE — [place] FL2 (Signed)
?[place] MEDICAID FL2 LEVEL OF CARE SCREENING TOOL  ?  ? ?IDENTIFICATION  ?Patient Name: ?Michele Matthews Birthdate: 07-Jun-1945 Sex: female Admission Date (Current Location): ?09/05/2021  ?[place] and [place] Number: ? [place] ?  Facility and Address:  ?[facility], [address], [place], [place] [address] ?     Provider Number: ?[id number]  ?Attending Physician Name and Address:  ?Fritzi Mandes, MD ? Relative Name and Phone Number:  ?Marguerite Olea husband [phone] ?   ?Current Level of Care: ?Hospital Recommended Level of Care: ?[place] Prior Approval Number: ?  ? ?Date Approved/Denied: ?  PASRR Number: ?[id number] ? ?Discharge Plan: ?SNF ?  ? ?Current Diagnoses: ?Patient Active Problem List  ? Diagnosis Date Noted  ? UTI (urinary tract infection) 09/05/2021  ? Chronic combined systolic and diastolic heart failure ([place]) 09/05/2021  ? Hypokalemia 11/28/2020  ? Hyponatremia 11/28/2020  ? Left foot infection 11/25/2020  ? Chronic combined systolic and diastolic CHF (congestive heart failure) ([place]) 07/24/2017  ? Severe protein-calorie malnutrition ([place]) 04/01/2016  ? Anemia, iron deficiency   ? Pressure ulcer 12/27/2015  ? Essential hypertension 12/27/2015  ? Anemia 10/26/2015  ? COPD (chronic obstructive pulmonary disease) ([place]) 06/19/2015  ? Coronary artery disease 06/19/2015  ? GERD (gastroesophageal reflux disease) 06/19/2015  ? Hyperlipidemia, unspecified 06/19/2015  ? Long term current use of anticoagulant therapy 06/19/2015  ? Anxiety and depression 06/19/2015  ? Symptomatic anemia 12/10/2014  ? Upper GI bleed   ? History of CVA with residual deficit 12/10/2013  ? Hemianopsia 12/10/2013  ? Left-sided weakness 12/10/2013  ? Tobacco abuse 12/10/2013  ? ? ?Orientation RESPIRATION BLADDER Height & Weight   ?  ?Self, Time, Situation, Place ? Normal Continent, External catheter Weight: 71.7 kg ?Height:  '5\' 5"'$  (165.1 cm)  ?BEHAVIORAL SYMPTOMS/MOOD NEUROLOGICAL BOWEL NUTRITION  STATUS  ?    Continent Diet (see dc summary)  ?AMBULATORY STATUS COMMUNICATION OF NEEDS Skin   ?Extensive Assist Verbally Normal ?  ?  ?  ?    ?     ?     ? ? ?Personal Care Assistance Level of Assistance  ?Bathing, Feeding, Dressing Bathing Assistance: Limited assistance ?Feeding assistance: Independent ?Dressing Assistance: Limited assistance ?   ? ?Functional Limitations Info  ?    ?  ?   ? ? ?SPECIAL CARE FACTORS FREQUENCY  ?PT (By licensed PT), OT (By licensed OT)   ?  ?PT Frequency: 5 times per week ?OT Frequency: 5 times per week ?  ?  ?  ?   ? ? ?Contractures Contractures Info: Not present  ? ? ?Additional Factors Info  ?Code Status, Allergies Code Status Info: full ?Allergies Info: NKDA ?  ?  ?  ?   ? ?Current Medications (09/07/2021):  This is the current hospital active medication list ?Current Facility-Administered Medications  ?Medication Dose Route Frequency Provider Last Rate Last Admin  ? acetaminophen (TYLENOL) tablet 650 mg  650 mg Oral Q6H PRN Ivor Costa, MD      ? albuterol (PROVENTIL) (2.5 MG/3ML) 0.083% nebulizer solution 3 mL  3 mL Inhalation Q4H PRN Ivor Costa, MD      ? ascorbic acid (VITAMIN C) tablet 250 mg  250 mg Oral BID Ivor Costa, MD   250 mg at 09/06/21 2146  ? atorvastatin (LIPITOR) tablet 20 mg  20 mg Oral Daily Ivor Costa, MD   20 mg at 09/06/21 6256  ? busPIRone (BUSPAR) tablet 7.5 mg  7.5 mg Oral BID Blaine Hamper,  Soledad Gerlach, MD   7.5 mg at 09/06/21 2151  ? carvedilol (COREG) tablet 3.125 mg  3.125 mg Oral BID WC Ivor Costa, MD   3.125 mg at 09/07/21 0848  ? cephALEXin (KEFLEX) capsule 500 mg  500 mg Oral Q12H Darrick Penna, RPH   500 mg at 09/06/21 2147  ? dextromethorphan-guaiFENesin (MUCINEX DM) 30-600 MG per 12 hr tablet 1 tablet  1 tablet Oral BID PRN Ivor Costa, MD      ? diphenhydrAMINE (BENADRYL) capsule 25 mg  25 mg Oral QHS PRN Ivor Costa, MD   25 mg at 09/05/21 2332  ? ferrous sulfate tablet 325 mg  325 mg Oral BID WC Fritzi Mandes, MD   325 mg at 09/06/21 1700  ? furosemide  (LASIX) tablet 20 mg  20 mg Oral Daily Ivor Costa, MD   20 mg at 09/06/21 6378  ? hydrOXYzine (ATARAX) tablet 10 mg  10 mg Oral BID PRN Ivor Costa, MD      ? iron sucrose (VENOFER) 200 mg in sodium chloride 0.9 % 100 mL IVPB  200 mg Intravenous Q24H Fritzi Mandes, MD 440 mL/hr at 09/06/21 1447 200 mg at 09/06/21 1447  ? lisinopril (ZESTRIL) tablet 5 mg  5 mg Oral Daily Fritzi Mandes, MD   5 mg at 09/06/21 1445  ? multivitamin with minerals tablet 1 tablet  1 tablet Oral Daily Ivor Costa, MD   1 tablet at 09/06/21 0809  ? nicotine (NICODERM CQ - dosed in mg/24 hours) patch 21 mg  21 mg Transdermal Daily Ivor Costa, MD   21 mg at 09/07/21 5885  ? ondansetron (ZOFRAN) injection 4 mg  4 mg Intravenous Q8H PRN Ivor Costa, MD      ? oxyCODONE (Oxy IR/ROXICODONE) immediate release tablet 5 mg  5 mg Oral Q4H PRN Ivor Costa, MD      ? pantoprazole (PROTONIX) injection 40 mg  40 mg Intravenous Q12H Ivor Costa, MD   40 mg at 09/06/21 1700  ? polyvinyl alcohol (LIQUIFILM TEARS) 1.4 % ophthalmic solution 1 drop  1 drop Both Eyes BID PRN Ivor Costa, MD      ? senna-docusate (Senokot-S) tablet 2 tablet  2 tablet Oral BID Ivor Costa, MD   2 tablet at 09/06/21 2147  ? sertraline (ZOLOFT) tablet 100 mg  100 mg Oral Daily Ivor Costa, MD   100 mg at 09/06/21 0277  ? ? ? ?Discharge Medications: ?Please see discharge summary for a list of discharge medications. ? ?Relevant Imaging Results: ? ?Relevant Lab Results: ? ? ?Additional Information ?SS #: [phone] ? ?Conception Oms, RN ? ? ? ? ?

## 2021-09-07 NOTE — TOC Progression Note (Signed)
Transition of Care (TOC) - Progression Note  ? ? ?Patient Details  ?Name: Michele Matthews ?MRN: [id number] ?Date of Birth: 1946/01/09 ? ?Transition of Care (TOC) CM/SW Contact  ?Conception Oms, RN ?Phone Number: ?09/07/2021, 4:24 PM ? ?Clinical Narrative:    ? ?Reviewed the bed offers with the patient, she will speak to her husband and give me her choice ? ?Expected Discharge Plan: [place] ?Barriers to Discharge: Continued Medical Work up, SNF Pending bed offer, Insurance Authorization ? ?Expected Discharge Plan and Services ?Expected Discharge Plan: [place] ?  ?  ?  ?  ?                ?  ?  ?  ?  ?  ?  ?  ?  ?  ?  ? ? ?Social Determinants of Health (SDOH) Interventions ?  ? ?Readmission Risk Interventions ?   ? View : No data to display.  ?  ?  ?  ? ? ?

## 2021-09-07 NOTE — TOC Progression Note (Signed)
Transition of Care (TOC) - Progression Note  ? ? ?Patient Details  ?Name: Michele Matthews ?MRN: [id number] ?Date of Birth: 09-Aug-1945 ? ?Transition of Care (TOC) CM/SW Contact  ?Conception Oms, RN ?Phone Number: ?09/07/2021, 10:42 AM ? ?Clinical Narrative:    ? ?Met with the patient at the bedside, she is agreeable to go to STR SNF< She does not want to go to [facility] [place] sent, PASSR obtained, Fl2 completed, will review bed offers once obtained ? ?Expected Discharge Plan: [place] ?Barriers to Discharge: Continued Medical Work up, SNF Pending bed offer, Insurance Authorization ? ?Expected Discharge Plan and Services ?Expected Discharge Plan: [place] ?  ?  ?  ?  ?                ?  ?  ?  ?  ?  ?  ?  ?  ?  ?  ? ? ?Social Determinants of Health (SDOH) Interventions ?  ? ?Readmission Risk Interventions ?   ? View : No data to display.  ?  ?  ?  ? ? ?

## 2021-09-07 NOTE — Evaluation (Signed)
Physical Therapy Evaluation ?Patient Details ?Name: Michele Matthews ?MRN: [id number] ?DOB: Jun 15, 1945 ?Today's Date: 09/07/2021 ? ?History of Present Illness ? Michele Matthews is a 76 y.o. female with medical history significant of iron deficiency anemia, upper GI bleeding, hypertension, hyperlipidemia, COPD, stroke, Plavix, GERD, depression, anxiety, tobacco abuse, CHF with EF 25-30%, who presents with generalized weakness, lightheadedness. ?  ?Clinical Impression ? Pt admitted with above diagnosis. Pt received upright in bed agreeable to PT eval with encouragement. Pt reports in normal health she is mod-I with rollator with mobility and with ADL's/IADL's but in the last 4-5 weeks pt has had weakness to the point where husband is applying physical assist to transfer from rollator seat to <> from toilet, chairs, bed, etc. Due to progressive weakness.  ? ?To date, pt relying on HOB elevated and minA+2 to modA+1 to transfer to EOB on chuck pad. Bed slightly elevated with minA+2 from RN to stand to RW with max multimodal cuing for hand placement on RW. Pt very kyphotic in nature which she reports is her baseline. Pt ambulating with minguard with minA at at [facility] for correct sequencing ~2' prior to pt requesting to sit down at [facility] requesting need for BM. BSC set up at bedside with pt requiring minA+2 for step pivot transfer and max multimodal cues for safe use of RW with heavy BUE reliance on RW for support.  Voiding of bladder but no BM with same assist level performed to return to EOB with poor carryover on safe use of RW and hand placement with minA to return to supine in bed and dependent for donning brief. All needs in reach. PT strongly encouraging STR despite pt wanting to return home due to previous, poor experience at STR at last admission. Pt educated on need for physical assist and unable to tolerate safe ambulation with AD at this time which is far below pt's baseline. Pt at significant risk for falls due to weakness,  poor safety awareness/use of AD, and balance. Pt currently with functional limitations due to the deficits listed below (see PT Problem List). Pt will benefit from skilled PT to increase their independence and safety with mobility to allow discharge to the venue listed below.     ? ?Recommendations for follow up therapy are one component of a multi-disciplinary discharge planning process, led by the attending physician.  Recommendations may be updated based on patient status, additional functional criteria and insurance authorization. ? ?Follow Up Recommendations Skilled nursing-short term rehab (<3 hours/day) ? ?  ?Assistance Recommended at Discharge Intermittent Supervision/Assistance  ?Patient can return home with the following ? A lot of help with walking and/or transfers;A lot of help with bathing/dressing/bathroom;Assist for transportation;Help with stairs or ramp for entrance ? ?  ?Equipment Recommendations Other (comment) (tbd by next venue of care)  ?Recommendations for Other Services ?    ?  ?Functional Status Assessment Patient has had a recent decline in their functional status and demonstrates the ability to make significant improvements in function in a reasonable and predictable amount of time.  ? ?  ?Precautions / Restrictions Precautions ?Precautions: Fall ?Restrictions ?Weight Bearing Restrictions: No  ? ?  ? ?Mobility ? Bed Mobility ?Overal bed mobility: Needs Assistance ?Bed Mobility: Supine to Sit, Sit to Supine ?  ?  ?Supine to sit: Mod assist, HOB elevated ?Sit to supine: Min assist, +2 for safety/equipment ?  ?General bed mobility comments: modA to attain sitting EOB at chuck pad to reach EOB ?Patient Response: Cooperative ? ?  Transfers ?Overall transfer level: Needs assistance ?Equipment used: Rolling walker (2 wheels) ?Transfers: Bed to chair/wheelchair/BSC ?Sit to Stand: Min assist, +2 physical assistance ?  ?Step pivot transfers: Min assist, +2 physical assistance ?  ?  ?  ?General  transfer comment: Pt requiring 2 person assist to STS and safely transfers to <> from surfaces. Believe with single person pt would be modA. ?  ? ?Ambulation/Gait ?Ambulation/Gait assistance: Min guard ?Gait Distance (Feet): 2 Feet ?Assistive device: Rolling walker (2 wheels) ?Gait Pattern/deviations: Step-to pattern, Decreased step length - right, Decreased step length - left, Trunk flexed ?  ?  ?  ?General Gait Details: Attempted to ambulate to toilet but pt only tolerating ~2' with RW requiring minguard and minA on RW for sequencing before requesting to sit. ? ?Stairs ?  ?  ?  ?  ?  ? ?Wheelchair Mobility ?  ? ?Modified Rankin (Stroke Patients Only) ?  ? ?  ? ?Balance Overall balance assessment: Needs assistance ?Sitting-balance support: Feet supported, Bilateral upper extremity supported ?Sitting balance-Leahy Scale: Fair ?  ?  ?  ?Standing balance-Leahy Scale: Poor ?Standing balance comment: heavy UE reliance on RW ?  ?  ?  ?  ?  ?  ?  ?  ?  ?  ?  ?   ? ? ? ?Pertinent Vitals/Pain Pain Assessment ?Pain Assessment: No/denies pain  ? ? ?Home Living Family/patient expects to be discharged to:: Private residence ?Living Arrangements: Spouse/significant other ?Available Help at Discharge: Family;Available 24 hours/day ?Type of Home: House ?Home Access: Stairs to enter ?Entrance Stairs-Rails:  (single rail does not specify side) ?Entrance Stairs-Number of Steps: 3 ?  ?Home Layout: One level ?Home Equipment: Conservation officer, nature (2 wheels);Rollator (4 wheels);Tub bench;Wheelchair - manual;Cane - single point ?   ?  ?Prior Function Prior Level of Function : Independent/Modified Independent ?  ?  ?  ?  ?  ?  ?Mobility Comments: mod-I with rollator until 4-5 weeks ago ?ADLs Comments: last 4-5 weeks has been reliant on husband to assist transferring to toilet ?  ? ? ?Hand Dominance  ? Dominant Hand: Right ? ?  ?Extremity/Trunk Assessment  ? Upper Extremity Assessment ?Upper Extremity Assessment: Generalized weakness ?  ? ?Lower  Extremity Assessment ?Lower Extremity Assessment: Generalized weakness ?  ? ?Cervical / Trunk Assessment ?Cervical / Trunk Assessment: Kyphotic  ?Communication  ? Communication: No difficulties  ?Cognition Arousal/Alertness: Awake/alert ?Behavior During Therapy: [place] for tasks assessed/performed ?Overall Cognitive Status: Within Functional Limits for tasks assessed ?  ?  ?  ?  ?  ?  ?  ?  ?  ?  ?  ?  ?  ?  ?  ?  ?  ?  ?  ? ?  ?General Comments   ? ?  ?Exercises Other Exercises ?Other Exercises: Role of PT in acute setting, d/c recs  ? ?Assessment/Plan  ?  ?PT Assessment Patient needs continued PT services  ?PT Problem List Decreased strength;Decreased mobility;Decreased safety awareness;Decreased activity tolerance;Decreased balance;Decreased knowledge of use of DME ? ?   ?  ?PT Treatment Interventions DME instruction;Therapeutic exercise;Gait training;Balance training;Stair training;Functional mobility training;Therapeutic activities;Patient/family education   ? ?PT Goals (Current goals can be found in the Care Plan section)  ?Acute Rehab PT Goals ?Patient Stated Goal: to go home ?PT Goal Formulation: With patient ?Time For Goal Achievement: 09/21/21 ?Potential to Achieve Goals: Good ? ?  ?Frequency Min 2X/week ?  ? ? ?Co-evaluation   ?  ?  ?  ?  ? ? ?  ?  AM-PAC PT "6 Clicks" Mobility  ?Outcome Measure Help needed turning from your back to your side while in a flat bed without using bedrails?: A Lot ?Help needed moving from lying on your back to sitting on the side of a flat bed without using bedrails?: A Lot ?Help needed moving to and from a bed to a chair (including a wheelchair)?: A Lot ?Help needed standing up from a chair using your arms (e.g., wheelchair or bedside chair)?: A Lot ?Help needed to walk in hospital room?: Total ?Help needed climbing 3-5 steps with a railing? : Total ?6 Click Score: 10 ? ?  ?End of Session Equipment Utilized During Treatment: Gait belt ?Activity Tolerance: Patient tolerated  treatment well ?Patient left: in bed;with call bell/phone within reach;with bed alarm set ?Nurse Communication: Mobility status ?PT Visit Diagnosis: Unsteadiness on feet (R26.81);Other abnormalities of gait and mob

## 2021-09-07 NOTE — Progress Notes (Signed)
?   09/07/21 0530 09/07/21 7948  ?Assess: MEWS Score  ?Temp 97.8 ?F (36.6 ?C) 97.7 ?F (36.5 ?C)  ?BP (!) 91/56 129/71  ?Pulse Rate 96 99  ?Resp (!) 21 18  ?Level of Consciousness Alert  --   ?SpO2 93 % 94 %  ?O2 Device Room [employer]  ?Assess: MEWS Score  ?MEWS Temp 0 0  ?MEWS Systolic 1 0  ?MEWS Pulse 0 0  ?MEWS RR 1 0  ?MEWS LOC 0 0  ?MEWS Score 2 0  ?MEWS Score Color Yellow Green  ?Assess: SIRS CRITERIA  ?SIRS Temperature  0 0  ?SIRS Pulse 1 1  ?SIRS Respirations  1 0  ?SIRS WBC 0 0  ?SIRS Score Sum  2 1  ? ? ?

## 2021-09-07 NOTE — Progress Notes (Signed)
Patient drank about 1/4 of colonoscopy prep. Educated patient several times about the purpose of drinking the solution. Education was unsuccessful. Patient had no BM this shift.  ?

## 2021-09-07 NOTE — Progress Notes (Signed)
[place] at [facility] ? ? ?PATIENT NAME: Michele Matthews   ? ?MR#:  [id number] ? ?DATE OF BIRTH:  03/25/1946 ? ?SUBJECTIVE:  ?patient came in with increasing shortness of breath for last three weeks. C/o increased tiredness and fatiguability. ?Patient came in with hemoglobin of 5.2. Got two unit blood transfusion. Denies any active bleeding. Has history of iron deficiency anemia. ? ?Patient drinking [place] poor prep. ? ? ?VITALS:  ?Blood pressure 110/64, pulse 91, temperature 98 ?F (36.7 ?C), resp. rate 17, height '5\' 5"'$  (1.651 m), weight 71.7 kg, SpO2 99 %. ? ?PHYSICAL EXAMINATION:  ? ?GENERAL:  76 y.o.-year-old patient lying in the bed with no acute distress. Pallor+ ?LUNGS: Normal breath sounds bilaterally, no wheezing, rales, rhonchi.  ?CARDIOVASCULAR: S1, S2 normal. No murmurs  ?ABDOMEN: Soft, nontender, nondistended. Bowel sounds present.  ?EXTREMITIES: No  edema b/l.    ?NEUROLOGIC: nonfocal  patient is alert and awake, weak ? ?LABORATORY PANEL:  ?CBC ?Recent Labs  ?Lab 09/06/21 ?0543  ?WBC 7.1  ?HGB 9.5*  ?HCT 32.2*  ?PLT 209  ? ? ? ?Chemistries  ?Recent Labs  ?Lab 09/05/21 ?4270 09/05/21 ?6237  ?NA  --  139  ?K  --  4.2  ?CL  --  107  ?CO2  --  21*  ?GLUCOSE  --  105*  ?BUN  --  24*  ?CREATININE  --  0.96  ?CALCIUM  --  9.3  ?AST 34  --   ?ALT 12  --   ?ALKPHOS 54  --   ?BILITOT 0.8  --   ? ? ?Cardiac Enzymes ?No results for input(s): TROPONINI in the last 168 hours. ?RADIOLOGY:  ?MR BRAIN WO CONTRAST ? ?Result Date: 09/06/2021 ?CLINICAL DATA:  Acute neurologic deficit acute neurologic deficit EXAM: MRI HEAD WITHOUT CONTRAST TECHNIQUE: Multiplanar, multiecho pulse sequences of the brain and surrounding structures were obtained without intravenous contrast. COMPARISON:  12/10/2014 FINDINGS: Brain: Large, old infarcts within the right MCA and left PCA territories. No acute infarct. No acute or chronic hemorrhage. There is multifocal hyperintense T2-weighted signal within the white  matter. Generalized cerebral volume loss. The midline structures are normal. Vascular: Major flow voids are preserved. Skull and upper cervical spine: Normal calvarium and skull base. Visualized upper cervical spine and soft tissues are normal. Sinuses/Orbits:No paranasal sinus fluid levels or advanced mucosal thickening. No mastoid or middle ear effusion. Normal orbits. IMPRESSION: 1. No acute intracranial abnormality. 2. Large, old infarcts within the right MCA and left PCA territories. Electronically Signed   By: Ulyses Jarred M.D.   On: 09/06/2021 02:04   ? ?Assessment and Plan ?Michele Matthews is a 76 y.o. female with medical history significant of iron deficiency anemia, upper GI bleeding, hypertension, hyperlipidemia, COPD, stroke, Plavix, GERD, depression, anxiety, tobacco abuse, CHF with EF 25-30%, who presents with generalized weakness, lightheadedness. ?  ?Patient states that she has generalized weakness for several days, which has been progressively worsening.  She also has lightheadedness and dizziness.  No fall. ? ?Acute on chronic iron deficiency anemia/symptomatic ?-- comes in with increasing shortness of breath  ?-- hgb 9.1 ?--came in with hgb with 5.2--2 units BT--9.5 ?-- G.I. consultation with Dr. Alice Reichert. Plans for EGD colonoscopy tomorrow ?-- hold Plavix ?-- IV iron infusion. Will start PO iron supplement ?-- patient will follow-up with Dr. Tasia Catchings hematology after discharge. Dr. Tasia Catchings aware ?-- IV Protonix ?-- EGD and colonoscopy postpone for tomorrow due to poor prep ? ?Chronic combined systolic and  diastolic heart failure (HCC) of CAD ?--2D echo on 07/24/2017 showed EF 25-30% with grade 2 diastolic dysfunction.  Patient does not have leg edema JVD.  CHF seem to be compensated. ?-Continue home Lasix 20 mg daily ?-Will give 40 mg of Lasix after finished first unit of blood transfusion ?  ?UTI (urinary tract infection) ?- IV Rocephin--change to po kelfex, no symptoms ?-Follow-up urine culture ?  ?Essential  hypertension ?- IV hydralazine as needed ?-Coreg and lisinopril ?-Patient is on Lasix for CHF ?   ?Tobacco abuse ?- Nicotine patch ?-- advised smoking cessation ?  ?Hyperlipidemia, unspecified ?- Lipitor ?  ?GERD (gastroesophageal reflux disease) ?- On Protonix ?  ?History of CVA with residual deficit ?-Patient reports left arm numbness and right eye vision change.  CT head negative. ?- Hold Plavix ?- Lipitor ?-  MRI-negative for stroke. Shows old stroke ?  ? COPD (chronic obstructive pulmonary disease) ([place]) ?Stable ?-Bronchodilators ? ? ?Procedures: ?Family communication : husband on the phone ?Consults : G.I. Dr. Alice Reichert ?CODE STATUS: full ?DVT Prophylaxis : SCD ?Level of care: Med-Surg ?Status is: Inpatient ?Remains inpatient appropriate because: anemia workup ?TOC for discharge planning to rehab ? ?TOTAL TIME TAKING CARE OF THIS PATIENT: 30 minutes.  ?>50% time spent on counselling and coordination of care ? ?Note: This dictation was prepared with Dragon dictation along with smaller phrase technology. Any transcriptional errors that result from this process are unintentional. ? ?Fritzi Mandes M.D  ? ? ?[facility]  ? ?CC: ?Primary care physician; [place]  ?

## 2021-09-08 ENCOUNTER — Encounter: Payer: Self-pay | Admitting: Internal Medicine

## 2021-09-08 ENCOUNTER — Inpatient Hospital Stay: Payer: Medicare HMO | Admitting: Certified Registered Nurse Anesthetist

## 2021-09-08 ENCOUNTER — Encounter
Admission: EM | Disposition: A | Discharge status: Home Health | Payer: Self-pay | Source: Home / Self Care | Attending: Internal Medicine

## 2021-09-08 DIAGNOSIS — D649 Anemia, unspecified: Secondary | ICD-10-CM | POA: Diagnosis not present

## 2021-09-08 HISTORY — PX: COLONOSCOPY WITH PROPOFOL: SHX5780

## 2021-09-08 HISTORY — PX: ESOPHAGOGASTRODUODENOSCOPY: SHX5428

## 2021-09-08 LAB — BPAM RBC
Blood Product Expiration Date: 202305162359
Blood Product Expiration Date: 202305232359
Blood Product Expiration Date: 202305232359
Blood Product Expiration Date: 202305242359
Blood Product Expiration Date: 202305282359
Blood Product Expiration Date: 202306012359
Blood Product Expiration Date: 202306092359
Blood Product Expiration Date: 202306132359
Blood Product Expiration Date: 202306142359
ISSUE DATE / TIME: 202305071707
ISSUE DATE / TIME: 202305072122
Unit Type and Rh: 9500
Unit Type and Rh: 9500
Unit Type and Rh: 9500
Unit Type and Rh: 9500
Unit Type and Rh: 9500
Unit Type and Rh: 9500
Unit Type and Rh: 9500
Unit Type and Rh: 9500
Unit Type and Rh: 9500

## 2021-09-08 LAB — TYPE AND SCREEN
ABO/RH(D): O NEG
Antibody Screen: POSITIVE
Unit division: 0
Unit division: 0
Unit division: 0
Unit division: 0
Unit division: 0
Unit division: 0
Unit division: 0
Unit division: 0
Unit division: 0

## 2021-09-08 LAB — GLUCOSE, CAPILLARY: Glucose-Capillary: 108 mg/dL — ABNORMAL HIGH (ref 70–99)

## 2021-09-08 SURGERY — EGD (ESOPHAGOGASTRODUODENOSCOPY)
Anesthesia: General

## 2021-09-08 MED ORDER — STERILE WATER FOR IRRIGATION IR SOLN
Status: DC | PRN
  Administered 2021-09-08: 60 mL

## 2021-09-08 MED ORDER — PHENYLEPHRINE 80 MCG/ML (10ML) SYRINGE FOR IV PUSH (FOR BLOOD PRESSURE SUPPORT)
PREFILLED_SYRINGE | INTRAVENOUS | Status: DC | PRN
  Administered 2021-09-08: 80 ug via INTRAVENOUS
  Administered 2021-09-08: 160 ug via INTRAVENOUS

## 2021-09-08 MED ORDER — SODIUM CHLORIDE 0.9 % IV SOLN: INTRAVENOUS | Status: DC

## 2021-09-08 MED ORDER — LIDOCAINE HCL (CARDIAC) PF 100 MG/5ML IV SOSY
PREFILLED_SYRINGE | INTRAVENOUS | Status: DC | PRN
  Administered 2021-09-08: 60 mg via INTRAVENOUS

## 2021-09-08 MED ORDER — SODIUM CHLORIDE 0.9 % IV SOLN
INTRAVENOUS | Status: DC
  Administered 2021-09-08: 1000 mL via INTRAVENOUS

## 2021-09-08 MED ORDER — PROPOFOL 10 MG/ML IV BOLUS
INTRAVENOUS | Status: DC | PRN
  Administered 2021-09-08: 50 mg via INTRAVENOUS

## 2021-09-08 MED ORDER — ZINC OXIDE 40 % EX OINT
TOPICAL_OINTMENT | CUTANEOUS | Status: DC | PRN
  Filled 2021-09-08: qty 113

## 2021-09-08 MED ORDER — PROPOFOL 500 MG/50ML IV EMUL
INTRAVENOUS | Status: DC | PRN
  Administered 2021-09-08: 125 ug/kg/min via INTRAVENOUS

## 2021-09-08 NOTE — Transfer of Care (Signed)
Immediate Anesthesia Transfer of Care Note ? ?[name] ? ?Procedure(s) Performed: ESOPHAGOGASTRODUODENOSCOPY (EGD) ?COLONOSCOPY WITH PROPOFOL ? ?Patient Location: PACU ? ?Anesthesia Type:General ? ?Level of Consciousness: awake ? ?Airway & Oxygen Therapy: Patient Spontanous Breathing ? ?Post-op Assessment: Report given to RN and Post -op Vital signs reviewed and stable ? ?Post vital signs: Reviewed and stable ? ?Last Vitals:  ?Vitals Value Taken Time  ?BP 98/57 09/08/21 1718  ?Temp    ?Pulse 85 09/08/21 1718  ?Resp 20 09/08/21 1718  ?SpO2 97 % 09/08/21 1718  ? ? ?Last Pain:  ?Vitals:  ? 09/08/21 1710  ?TempSrc: Temporal  ?PainSc:   ?   ? ?  ? ?Complications: No notable events documented. ?

## 2021-09-08 NOTE — Anesthesia Preprocedure Evaluation (Signed)
Anesthesia Evaluation  ?Patient identified by MRN, date of birth, ID band ?Patient awake ? ? ? ?Reviewed: ?Allergy & Precautions, H&P , NPO status , Patient's Chart, lab work & pertinent test results, reviewed documented beta blocker date and time  ? ?Airway ?Mallampati: II ? ? ?Neck ROM: full ? ? ? Dental ? ?(+) Poor Dentition ?  ?Pulmonary ?COPD, Current Smoker and Patient abstained from smoking.,  ?  ?Pulmonary exam normal ? ? ? ? ? ? ? Cardiovascular ?Exercise Tolerance: Poor ?hypertension, On Medications ?+ CAD and +CHF  ?Normal cardiovascular exam ?Rhythm:regular Rate:Normal ? ? ?  ?Neuro/Psych ?Seizures -,  PSYCHIATRIC DISORDERS Anxiety Depression CVA   ? GI/Hepatic ?Neg liver ROS, GERD  Medicated,  ?Endo/Other  ?negative endocrine ROS ? Renal/GU ?negative Renal ROS  ?negative genitourinary ?  ?Musculoskeletal ? ? Abdominal ?  ?Peds ? Hematology ? ?(+) Blood dyscrasia, anemia ,   ?Anesthesia Other Findings ?Past Medical History: ?No date: Allergy ?No date: COPD (chronic obstructive pulmonary disease) ([place]) ?No date: Coronary artery disease ?No date: Depression ?No date: Emphysema of lung ([place]) ?No date: GERD (gastroesophageal reflux disease) ?No date: Hyperlipidemia ?No date: Hypertension ?No date: Seizures ([place]) ?No date: Stroke [facility]) ?Past Surgical History: ?08/10/2015: ESOPHAGOGASTRODUODENOSCOPY; N/A ?    Comment:  Procedure: ESOPHAGOGASTRODUODENOSCOPY (EGD);  Surgeon:  ?             Manya Silvas, MD;  Location: [facility] ENDOSCOPY;   ?             Service: Endoscopy;  Laterality: N/A; ?12/11/2014: ESOPHAGOGASTRODUODENOSCOPY (EGD) WITH PROPOFOL; N/A ?    Comment:  Procedure: ESOPHAGOGASTRODUODENOSCOPY (EGD) WITH  ?             PROPOFOL;  Surgeon: Lucilla Lame, MD;  Location: [facility]  ?             ENDOSCOPY;  Service: Endoscopy;  Laterality: N/A; ?12/29/2015: ESOPHAGOGASTRODUODENOSCOPY (EGD) WITH PROPOFOL; N/A ?    Comment:  Procedure: ESOPHAGOGASTRODUODENOSCOPY (EGD) WITH  ?              PROPOFOL;  Surgeon: Mauri Pole, MD;  Location: [facility] ?             ENDOSCOPY;  Service: Endoscopy;  Laterality: N/A; ?11/25/2020: LOWER EXTREMITY ANGIOGRAPHY; Left ?    Comment:  Procedure: Lower Extremity Angiography;  Surgeon: Lucky Cowboy,  ?             Erskine Squibb, MD;  Location: [place] CV LAB;  Service:  ?             Cardiovascular;  Laterality: Left; ?11/30/2020: LOWER EXTREMITY ANGIOGRAPHY; Left ?    Comment:  Procedure: Lower Extremity Angiography;  Surgeon: Lucky Cowboy,  ?             Erskine Squibb, MD;  Location: [place] CV LAB;  Service:  ?             Cardiovascular;  Laterality: Left; ?12/02/2020: TRANSMETATARSAL AMPUTATION; Left ?    Comment:  Procedure: TRANSMETATARSAL AMPUTATION;  Surgeon: Luana Shu,  ?             Mitzi Hansen, DPM;  Location: [facility] ORS;  Service: Podiatry;   ?             Laterality: Left; ?BMI   ? Body Mass Index: 21.11 kg/m?  ?  ? Reproductive/Obstetrics ?negative OB ROS ? ?  ? ? ? ? ? ? ? ? ? ? ? ? ? ?  ?  ? ? ? ? ? ? ? ? ?  Anesthesia Physical ?Anesthesia Plan ? ?ASA: 3 ? ?Anesthesia Plan: General  ? ?Post-op Pain Management:   ? ?Induction:  ? ?PONV Risk Score and Plan:  ? ?Airway Management Planned:  ? ?Additional Equipment:  ? ?Intra-op Plan:  ? ?Post-operative Plan:  ? ?Informed Consent: I have reviewed the patients History and Physical, chart, labs and discussed the procedure including the risks, benefits and alternatives for the proposed anesthesia with the patient or authorized representative who has indicated his/her understanding and acceptance.  ? ? ? ?Dental Advisory Given ? ?Plan Discussed with: CRNA ? ?Anesthesia Plan Comments:   ? ? ? ? ? ? ?Anesthesia Quick Evaluation ? ?

## 2021-09-08 NOTE — Progress Notes (Signed)
[place] at [facility] ? ? ?PATIENT NAME: Michele Matthews   ? ?MR#:  [id number] ? ?DATE OF BIRTH:  22-Jan-1946 ? ?SUBJECTIVE:  ?patient came in with increasing shortness of breath for last three weeks. C/o increased tiredness and fatiguability. ?Patient came in with hemoglobin of 5.2. Got two unit blood transfusion. Denies any active bleeding. Has history of iron deficiency anemia. ? ?Completed prep. Pending  luminal evaluation ? ? ?VITALS:  ?Blood pressure 117/73, pulse 84, temperature 98 ?F (36.7 ?C), resp. rate 16, height '5\' 5"'$  (1.651 m), weight 71.7 kg, SpO2 98 %. ? ?PHYSICAL EXAMINATION:  ? ?GENERAL:  76 y.o.-year-old patient lying in the bed with no acute distress. Pallor+ ?LUNGS: Normal breath sounds bilaterally ?CARDIOVASCULAR: S1, S2 normal. No murmurs  ?ABDOMEN: Soft, nontender, nondistended. Bowel sounds present.  ?EXTREMITIES: No  edema b/l.    ?NEUROLOGIC: nonfocal  patient is alert and awake, weak ? ?LABORATORY PANEL:  ?CBC ?Recent Labs  ?Lab 09/06/21 ?0543  ?WBC 7.1  ?HGB 9.5*  ?HCT 32.2*  ?PLT 209  ? ? ? ?Chemistries  ?Recent Labs  ?Lab 09/05/21 ?4098 09/05/21 ?1191  ?NA  --  139  ?K  --  4.2  ?CL  --  107  ?CO2  --  21*  ?GLUCOSE  --  105*  ?BUN  --  24*  ?CREATININE  --  0.96  ?CALCIUM  --  9.3  ?AST 34  --   ?ALT 12  --   ?ALKPHOS 54  --   ?BILITOT 0.8  --   ? ? ?Cardiac Enzymes ?No results for input(s): TROPONINI in the last 168 hours. ?RADIOLOGY:  ?No results found. ? ?Assessment and Plan ?Michele Matthews is a 76 y.o. female with medical history significant of iron deficiency anemia, upper GI bleeding, hypertension, hyperlipidemia, COPD, stroke, Plavix, GERD, depression, anxiety, tobacco abuse, CHF with EF 25-30%, who presents with generalized weakness, lightheadedness. ?  ?Patient states that she has generalized weakness for several days, which has been progressively worsening.  She also has lightheadedness and dizziness.  No fall. ? ?Acute on chronic iron deficiency  anemia/symptomatic ?-- comes in with increasing shortness of breath  ?-- hgb 9.1 ?--came in with hgb with 5.2--2 units BT--9.5 ?-- G.I. consultation with Dr. Alice Reichert. Plans for EGD colonoscopy tomorrow ?-- hold Plavix ?-- IV iron infusion. Will start PO iron supplement ?-- patient will follow-up with Dr. Tasia Catchings hematology after discharge. Dr. Tasia Catchings aware ?-- IV Protonix ?-- EGD and colonoscopy postpone for today due to poor prep ? 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  Patient does not have leg edema JVD.  CHF seem to be compensated. ?-Continue home Lasix 20 mg daily ?-Will give 40 mg of Lasix after finished first unit of blood transfusion ?  ?UTI (urinary tract infection) ?- IV Rocephin--change to po kelfex, no symptoms ?-Follow-up urine culture--Klesiella species ?  ?Essential hypertension ?- IV hydralazine as needed ?-Coreg and lisinopril ?-Patient is on Lasix for CHF ?   ?Tobacco abuse ?- Nicotine patch ?-- advised smoking cessation ?  ?Hyperlipidemia, unspecified ?- Lipitor ?  ?GERD (gastroesophageal reflux disease) ?- On Protonix ?  ?History of CVA with residual deficit ?-Patient reports left arm numbness and right eye vision change.  CT head negative. ?- Hold Plavix ?- Lipitor ?-  MRI-negative for stroke. Shows old stroke ?  ? COPD (chronic obstructive pulmonary disease) ([place]) ?  Stable ?-Bronchodilators ? ? ?Procedures: ?Family communication : husband on the phone ?Consults : G.I. Dr. Alice Reichert ?CODE STATUS: full ?DVT Prophylaxis : SCD ?Level of care: Med-Surg ?Status is: Inpatient ?Remains inpatient appropriate because: anemia workup ?TOC for discharge planning to home. Family declined rehab ? ?TOTAL TIME TAKING CARE OF THIS PATIENT: 30 minutes.  ?>50% time spent on counselling and coordination of care ? ?Note: This dictation was prepared with Dragon dictation along with smaller phrase technology. Any  transcriptional errors that result from this process are unintentional. ? ?Fritzi Mandes M.D  ? ? ?[facility]  ? ?CC: ?Primary care physician; [place]  ?

## 2021-09-08 NOTE — Op Note (Signed)
[facility] ?Gastroenterology ?Patient Name: Michele Matthews ?Procedure Date: 09/08/2021 4:37 PM ?MRN: [id number] ?Account #: [id number] ?Date of Birth: [birth year] ?Admit Type: Inpatient ?Age: 76 ?Room: [facility] ENDO ROOM 2 ?Gender: Female ?Note Status: Finalized ?Instrument Name: Upper Endoscope [id number] ?Procedure:             Upper GI endoscopy ?Indications:           Iron deficiency anemia secondary to chronic blood loss ?Providers:             Benay Pike. Alice Reichert MD, MD ?Referring MD:          No Local Md, MD (Referring MD) ?Medicines:             Propofol per Anesthesia ?Complications:         No immediate complications. ?Procedure:             Pre-Anesthesia Assessment: ?                       - The risks and benefits of the procedure and the  ?                       sedation options and risks were discussed with the  ?                       patient. All questions were answered and informed  ?                       consent was obtained. ?                       - Patient identification and proposed procedure were  ?                       verified prior to the procedure by the nurse. The  ?                       procedure was verified in the procedure room. ?                       - ASA Grade Assessment: III - A patient with severe  ?                       systemic disease. ?                       - After reviewing the risks and benefits, the patient  ?                       was deemed in satisfactory condition to undergo the  ?                       procedure. ?                       After obtaining informed consent, the endoscope was  ?                       passed under direct vision. Throughout the procedure,  ?  the patient's blood pressure, pulse, and oxygen  ?                       saturations were monitored continuously. The Endoscope  ?                       was introduced through the mouth, and advanced to the  ?                       third part of duodenum. The upper GI  endoscopy was  ?                       accomplished without difficulty. The patient tolerated  ?                       the procedure well. ?Findings: ?     The esophagus was normal. ?     Patchy mild inflammation characterized by erosions and erythema was  ?     found in the gastric antrum. Biopsies were taken with a cold forceps for  ?     Helicobacter pylori testing. ?     A 3 cm hiatal hernia was present. ?     No other significant abnormalities were identified in a careful  ?     examination of the stomach. ?     The examined duodenum was normal. ?     The exam was otherwise without abnormality. ?Impression:            - Normal esophagus. ?                       - Gastritis. Biopsied. ?                       - 3 cm hiatal hernia. ?                       - Normal examined duodenum. ?                       - The examination was otherwise normal. ?Recommendation:        - Await pathology results. ?                       - Proceed with colonoscopy ?Procedure Code(s):     --- Professional --- ?                       [phone], Esophagogastroduodenoscopy, flexible,  ?                       transoral; with biopsy, single or multiple ?Diagnosis Code(s):     --- Professional --- ?                       K29.70, Gastritis, unspecified, without bleeding ?                       K44.9, Diaphragmatic hernia without obstruction or  ?                       gangrene ?  D50.0, Iron deficiency anemia secondary to blood loss  ?                       (chronic) ?CPT copyright 2019 American Medical Association. All rights reserved. ?The codes documented in this report are preliminary and upon coder review may  ?be revised to meet current compliance requirements. ?Efrain Sella MD, MD ?09/08/2021 4:54:53 PM ?This report has been signed electronically. ?Number of Addenda: 0 ?Note Initiated On: 09/08/2021 4:37 PM ?Estimated Blood Loss:  Estimated blood loss: none. ?     [facility] ?

## 2021-09-08 NOTE — Progress Notes (Signed)
[place] at [facility] ? ? ?PATIENT NAME: Michele Matthews   ? ?MR#:  [id number] ? ?DATE OF BIRTH:  04-03-1946 ? ?SUBJECTIVE:  ?patient came in with increasing shortness of breath for last three weeks. C/o increased tiredness and fatiguability. ?Patient came in with hemoglobin of 5.2. Got two unit blood transfusion. Denies any active bleeding. Has history of iron deficiency anemia. ? ?Completed prep. Pending  luminal evaluation ? ? ?VITALS:  ?Blood pressure 135/82, pulse 99, temperature 97.9 ?F (36.6 ?C), resp. rate 16, height '5\' 5"'$  (1.651 m), weight 71.7 kg, SpO2 97 %. ? ?PHYSICAL EXAMINATION:  ? ?GENERAL:  76 y.o.-year-old patient lying in the bed with no acute distress. Pallor+ ?LUNGS: Normal breath sounds bilaterally ?CARDIOVASCULAR: S1, S2 normal. No murmurs  ?ABDOMEN: Soft, nontender, nondistended. Bowel sounds present.  ?EXTREMITIES: No  edema b/l.    ?NEUROLOGIC: nonfocal  patient is alert and awake, weak ? ?LABORATORY PANEL:  ?CBC ?Recent Labs  ?Lab 09/06/21 ?0543  ?WBC 7.1  ?HGB 9.5*  ?HCT 32.2*  ?PLT 209  ? ? ? ?Chemistries  ?Recent Labs  ?Lab 09/05/21 ?5916 09/05/21 ?3846  ?NA  --  139  ?K  --  4.2  ?CL  --  107  ?CO2  --  21*  ?GLUCOSE  --  105*  ?BUN  --  24*  ?CREATININE  --  0.96  ?CALCIUM  --  9.3  ?AST 34  --   ?ALT 12  --   ?ALKPHOS 54  --   ?BILITOT 0.8  --   ? ? ?Cardiac Enzymes ?No results for input(s): TROPONINI in the last 168 hours. ?RADIOLOGY:  ?No results found. ? ?Assessment and Plan ?[name] is a 76 y.o. female with medical history significant of iron deficiency anemia, upper GI bleeding, hypertension, hyperlipidemia, COPD, stroke, Plavix, GERD, depression, anxiety, tobacco abuse, CHF with EF 25-30%, who presents with generalized weakness, lightheadedness. ?  ?Patient states that she has generalized weakness for several days, which has been progressively worsening.  She also has lightheadedness and dizziness.  No fall. ? ?Acute on chronic iron deficiency  anemia/symptomatic ?-- comes in with increasing shortness of breath  ?-- hgb 9.1 ?--came in with hgb with 5.2--2 units BT--9.5 ?-- G.I. consultation with Dr. Alice Reichert. Plans for EGD colonoscopy tomorrow ?-- hold Plavix ?-- IV iron infusion. Will start PO iron supplement ?-- patient will follow-up with Dr. Tasia Catchings hematology after discharge. Dr. Tasia Catchings aware ?-- IV Protonix ?-- EGD and colonoscopy postpone for today due to poor prep ? 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  Patient does not have leg edema JVD.  CHF seem to be compensated. ?-Continue home Lasix 20 mg daily ?-Will give 40 mg of Lasix after finished first unit of blood transfusion ?  ?UTI (urinary tract infection) ?- IV Rocephin--change to po kelfex, no symptoms ?-Follow-up urine culture--Klesiella species ?  ?Essential hypertension ?- IV hydralazine as needed ?-Coreg and lisinopril ?-Patient is on Lasix for CHF ?   ?Tobacco abuse ?- Nicotine patch ?-- advised smoking cessation ?  ?Hyperlipidemia, unspecified ?- Lipitor ?  ?GERD (gastroesophageal reflux disease) ?- On Protonix ?  ?History of CVA with residual deficit ?-Patient reports left arm numbness and right eye vision change.  CT head negative. ?- Hold Plavix ?- Lipitor ?-  MRI-negative for stroke. Shows old stroke ?  ? COPD (chronic obstructive pulmonary disease) ([place]) ?  Stable ?-Bronchodilators ? ? ?Procedures: ?Family communication : husband on the phone ?Consults : G.I. Dr. Alice Reichert ?CODE STATUS: full ?DVT Prophylaxis : SCD ?Level of care: Med-Surg ?Status is: Inpatient ?Remains inpatient appropriate because: anemia workup ?TOC for discharge planning to home. Family declined rehab ? ?TOTAL TIME TAKING CARE OF THIS PATIENT: 30 minutes.  ?>50% time spent on counselling and coordination of care ? ?Note: This dictation was prepared with Dragon dictation along with smaller phrase technology. Any  transcriptional errors that result from this process are unintentional. ? ?Fritzi Mandes M.D  ? ? ?[facility]  ? ?CC: ?Primary care physician; [place]  ?

## 2021-09-08 NOTE — Progress Notes (Signed)
Patient is not able to walk the distance required to go the bathroom, or he/she is unable to safely negotiate stairs required to access the bathroom.  A 3in1 BSC will alleviate this problem  

## 2021-09-08 NOTE — Op Note (Signed)
[facility] ?Gastroenterology ?Patient Name: Michele Matthews ?Procedure Date: 09/08/2021 4:37 PM ?MRN: [id number] ?Account #: [id number] ?Date of Birth: [birth year] ?Admit Type: Inpatient ?Age: 76 ?Room: [facility] ENDO ROOM 2 ?Gender: Female ?Note Status: Finalized ?Instrument Name: Colonoscope [id number] ?Procedure:             Colonoscopy ?Indications:           Iron deficiency anemia secondary to chronic blood loss ?Providers:             Benay Pike. Alice Reichert MD, MD ?Referring MD:          No Local Md, MD (Referring MD) ?Medicines:             Propofol per Anesthesia ?Complications:         No immediate complications. Estimated blood loss: None. ?Procedure:             Pre-Anesthesia Assessment: ?                       - The risks and benefits of the procedure and the  ?                       sedation options and risks were discussed with the  ?                       patient. All questions were answered and informed  ?                       consent was obtained. ?                       - Patient identification and proposed procedure were  ?                       verified prior to the procedure by the nurse. The  ?                       procedure was verified in the procedure room. ?                       - ASA Grade Assessment: III - A patient with severe  ?                       systemic disease. ?                       - After reviewing the risks and benefits, the patient  ?                       was deemed in satisfactory condition to undergo the  ?                       procedure. ?                       After obtaining informed consent, the colonoscope was  ?                       passed under direct vision. Throughout the procedure,  ?  the patient's blood pressure, pulse, and oxygen  ?                       saturations were monitored continuously. The  ?                       Colonoscope was introduced through the anus and  ?                       advanced to the the cecum, identified  by appendiceal  ?                       orifice and ileocecal valve. The colonoscopy was  ?                       performed without difficulty. The patient tolerated  ?                       the procedure well. The quality of the bowel  ?                       preparation was good. The ileocecal valve, appendiceal  ?                       orifice, and rectum were photographed. ?Findings: ?     The perianal and digital rectal examinations were normal. Pertinent  ?     negatives include normal sphincter tone and no palpable rectal lesions. ?     Non-bleeding internal hemorrhoids were found during retroflexion. The  ?     hemorrhoids were Grade I (internal hemorrhoids that do not prolapse). ?     Many small and large-mouthed diverticula were found in the sigmoid  ?     colon. No stigmata of active or recent bleeding noted. ?     Two sessile polyps were found in the sigmoid colon. The polyps were 7 to  ?     8 mm in size. These polyps were removed with a hot snare. Resection and  ?     retrieval were complete. To prevent bleeding after the polypectomy, two  ?     hemostatic clips were successfully placed (MR conditional). There was no  ?     bleeding during, or at the end, of the procedure. ?     The exam was otherwise without abnormality. ?Impression:            - Non-bleeding internal hemorrhoids. ?                       - Diverticulosis in the sigmoid colon. ?                       - Two 7 to 8 mm polyps in the sigmoid colon, removed  ?                       with a hot snare. Resected and retrieved. Clips (MR  ?                       conditional) were placed. ?                       -  The examination was otherwise normal. ?Recommendation:        - Patient has a contact number available for  ?                       emergencies. The signs and symptoms of potential  ?                       delayed complications were discussed with the patient.  ?                       Return to normal activities tomorrow. Written  ?                        discharge instructions were provided to the patient. ?                       - Return patient to hospital ward for possible  ?                       discharge same day. ?                       - Await pathology results. ?                       - As no sinister lesions or history of obscure overt  ?                       bleeding, GI will sign off. ?Procedure Code(s):     --- Professional --- ?                       [phone], Colonoscopy, flexible; with removal of  ?                       tumor(s), polyp(s), or other lesion(s) by snare  ?                       technique ?Diagnosis Code(s):     --- Professional --- ?                       K57.30, Diverticulosis of large intestine without  ?                       perforation or abscess without bleeding ?                       D50.0, Iron deficiency anemia secondary to blood loss  ?                       (chronic) ?                       K63.5, Polyp of colon ?                       K64.0, First degree hemorrhoids ?CPT copyright 2019 American Medical Association. All rights reserved. ?The codes documented in this report are preliminary and upon coder review may  ?be revised to meet current compliance requirements. ?Efrain Sella MD, MD ?09/08/2021 5:33:52  PM ?This report has been signed electronically. ?Number of Addenda: 0 ?Note Initiated On: 09/08/2021 4:37 PM ?Scope Withdrawal Time: 0 hours 9 minutes 43 seconds  ?Total Procedure Duration: 0 hours 13 minutes 58 seconds  ?Estimated Blood Loss:  Estimated blood loss: none. ?     [facility] ?

## 2021-09-08 NOTE — Progress Notes (Signed)
Physical Therapy Treatment ?Patient Details ?Name: Michele Matthews ?MRN: [id number] ?DOB: Nov 17, 1945 ?Today's Date: 09/08/2021 ? ? ?History of Present Illness 76 y.o. female with medical history significant of iron deficiency anemia, upper GI bleeding, hypertension, hyperlipidemia, COPD, stroke, Plavix, GERD, depression, anxiety, tobacco abuse, CHF with EF 25-30%, who presents with generalized weakness, lightheadedness. ? ?  ?PT Comments  ? ? Pt was assisted to move to side of bed and was able to pivot with PT directly to [facility], then back to bed.  Pt did allow PT to use the RW for standing support to get cleaned up, but after was panicked again to use the walker for pivoting to the bed.  Follow along with her to work on active gait skills as pt will allow, and continue to recommend SNF care due to her dense weakness, limited endurance and low tolerance for standing and balancing.  Pt needs to build confidence in her safety to move with therapy and to return home with family help.     ?Recommendations for follow up therapy are one component of a multi-disciplinary discharge planning process, led by the attending physician.  Recommendations may be updated based on patient status, additional functional criteria and insurance authorization. ? ?Follow Up Recommendations ? Skilled nursing-short term rehab (<3 hours/day) ?  ?  ?Assistance Recommended at Discharge Frequent or constant Supervision/Assistance  ?Patient can return home with the following A lot of help with walking and/or transfers;A lot of help with bathing/dressing/bathroom;Assist for transportation;Help with stairs or ramp for entrance;Assistance with cooking/housework;Direct supervision/assist for medications management;Direct supervision/assist for financial management ?  ?Equipment Recommendations ? None recommended by PT (SNF to determine)  ?  ?Recommendations for Other Services   ? ? ?  ?Precautions / Restrictions Precautions ?Precautions: Fall ?Precaution  Comments: mild agitation with all movement ?Restrictions ?Weight Bearing Restrictions: No  ?  ? ?Mobility ? Bed Mobility ?Overal bed mobility: Needs Assistance ?Bed Mobility: Supine to Sit, Sit to Supine ?  ?  ?Supine to sit: Min assist ?Sit to supine: Min assist ?  ?General bed mobility comments: min assist to EOB and back, pt helpfl ?  ? ?Transfers ?Overall transfer level: Needs assistance ?Equipment used: Rolling walker (2 wheels) ?Transfers: Bed to chair/wheelchair/BSC ?Sit to Stand: Mod assist, From elevated surface ?  ?Step pivot transfers: Mod assist ?  ?  ?  ?General transfer comment: pt was anxious to move in transfer but once there was a bit calmer, although preferred direct assist to using RW ?  ? ?Ambulation/Gait ?  ?  ?  ?  ?  ?  ?  ?General Gait Details: declined ? ? ?Stairs ?  ?  ?  ?  ?  ? ? ?Wheelchair Mobility ?  ? ?Modified Rankin (Stroke Patients Only) ?  ? ? ?  ?Balance Overall balance assessment: Needs assistance ?Sitting-balance support: Feet supported, Bilateral upper extremity supported ?Sitting balance-Leahy Scale: Fair ?Sitting balance - Comments: cues for balancing midline ?  ?Standing balance support: Bilateral upper extremity supported, During functional activity ?Standing balance-Leahy Scale: Poor ?Standing balance comment: pt is panicking a bit about the transition ?  ?  ?  ?  ?  ?  ?  ?  ?  ?  ?  ?  ? ?  ?Cognition Arousal/Alertness: Awake/alert ?Behavior During Therapy: Anxious, Impulsive ?Overall Cognitive Status: No family/caregiver present to determine baseline cognitive functioning ?  ?  ?  ?  ?  ?  ?  ?  ?  ?  ?  ?  ?  ?  ?  ?  ?  General Comments: unclear if pt is usually that anxious ?  ?  ? ?  ?Exercises   ? ?  ?General Comments General comments (skin integrity, edema, etc.): pt is getting up to side of bed to practice transition to [facility], with good response after tx.  Nursing in to assist and was able to observe her functionally ?  ?  ? ?Pertinent Vitals/Pain Pain  Assessment ?Pain Assessment: No/denies pain  ? ? ?Home Living   ?  ?  ?  ?  ?  ?  ?  ?  ?  ?   ?  ?Prior Function    ?  ?  ?   ? ?PT Goals (current goals can now be found in the care plan section) Acute Rehab PT Goals ?Patient Stated Goal: to get to bedpan ? ?  ?Frequency ? ? ? Min 2X/week ? ? ? ?  ?PT Plan Current plan remains appropriate  ? ? ?Co-evaluation   ?  ?  ?  ?  ? ?  ?AM-PAC PT "6 Clicks" Mobility   ?Outcome Measure ? Help needed turning from your back to your side while in a flat bed without using bedrails?: A Lot ?Help needed moving from lying on your back to sitting on the side of a flat bed without using bedrails?: A Lot ?Help needed moving to and from a bed to a chair (including a wheelchair)?: A Lot ?Help needed standing up from a chair using your arms (e.g., wheelchair or bedside chair)?: A Lot ?Help needed to walk in hospital room?: Total ?Help needed climbing 3-5 steps with a railing? : Total ?6 Click Score: 10 ? ?  ?End of Session Equipment Utilized During Treatment: Gait belt ?Activity Tolerance: Patient tolerated treatment well ?Patient left: in bed;with call bell/phone within reach;with bed alarm set ?Nurse Communication: Mobility status ?PT Visit Diagnosis: Unsteadiness on feet (R26.81);Other abnormalities of gait and mobility (R26.89);Muscle weakness (generalized) (M62.81) ?  ? ? ?Time: 5456-2563 ?PT Time Calculation (min) (ACUTE ONLY): 26 min ? ?Charges:  $Therapeutic Activity: 23-37 mins         ?Ramond Dial ?09/08/2021, 2:50 PM ? ?Mee Hives, PT PhD ?Acute Rehab Dept. Number: [facility] [phone] and [place] [phone] ? ? ?

## 2021-09-08 NOTE — TOC Progression Note (Addendum)
Transition of Care (TOC) - Progression Note  ? ? ?Patient Details  ?Name: Michele Matthews ?MRN: [id number] ?Date of Birth: [birth year] ? ?Transition of Care (TOC) CM/SW Contact  ?Conception Oms, RN ?Phone Number: ?09/08/2021, 11:07 AM ? ?Clinical Narrative:    ? ?Spoke with Darrel the husband, Spoke about recommendation to go to [facility] SNF, He stated that they are taking her home, refusing SNF, She has a RW, and 2 shower benches, has a cane, will need a 3 in 1, [employer] to deliver to the bedside ?He wants to use [facility] that she has had in the past for [facility] PT,  ?He provides transportation ? ? ?Expected Discharge Plan: [place] ?Barriers to Discharge: Continued Medical Work up, SNF Pending bed offer, Insurance Authorization ? ?Expected Discharge Plan and Services ?Expected Discharge Plan: [place] ?  ?  ?  ?  ?                ?  ?  ?  ?  ?  ?  ?  ?  ?  ?  ? ? ?Social Determinants of Health (SDOH) Interventions ?  ? ?Readmission Risk Interventions ?   ? View : No data to display.  ?  ?  ?  ? ? ?

## 2021-09-09 DIAGNOSIS — D649 Anemia, unspecified: Secondary | ICD-10-CM | POA: Diagnosis not present

## 2021-09-09 LAB — CBC
HCT: 32.7 % — ABNORMAL LOW (ref 36.0–46.0)
Hemoglobin: 9.3 g/dL — ABNORMAL LOW (ref 12.0–15.0)
MCH: 21 pg — ABNORMAL LOW (ref 26.0–34.0)
MCHC: 28.4 g/dL — ABNORMAL LOW (ref 30.0–36.0)
MCV: 74 fL — ABNORMAL LOW (ref 80.0–100.0)
Platelets: 165 10*3/uL (ref 150–400)
RBC: 4.42 MIL/uL (ref 3.87–5.11)
RDW: 30.5 % — ABNORMAL HIGH (ref 11.5–15.5)
WBC: 6.2 10*3/uL (ref 4.0–10.5)
nRBC: 0.8 % — ABNORMAL HIGH (ref 0.0–0.2)

## 2021-09-09 LAB — URINE CULTURE: Culture: >=100000 — AB

## 2021-09-09 LAB — GLUCOSE, CAPILLARY: Glucose-Capillary: 95 mg/dL (ref 70–99)

## 2021-09-09 MED ORDER — FERROUS SULFATE 325 (65 FE) MG PO TABS: 325.0000 mg | ORAL_TABLET | Freq: Two times a day (BID) | ORAL | 3 refills | Status: DC

## 2021-09-09 MED ORDER — CEPHALEXIN 500 MG PO CAPS
500.0000 mg | ORAL_CAPSULE | Freq: Two times a day (BID) | ORAL | 0 refills | Status: AC
Start: 2021-09-09 — End: 2021-09-11

## 2021-09-09 MED ORDER — CEPHALEXIN 500 MG PO CAPS: 500.0000 mg | ORAL_CAPSULE | Freq: Two times a day (BID) | ORAL | 0 refills | Status: DC

## 2021-09-09 NOTE — Care Management Important Message (Signed)
Important Message ? ?Patient Details  ?Name: Michele Matthews ?MRN: [id number] ?Date of Birth: 1946-02-16 ? ? ?Medicare Important Message Given:  Yes ? ? ? ? ?[name] ?09/09/2021, 9:41 AM ?

## 2021-09-09 NOTE — Discharge Summary (Addendum)
?Physician Discharge Summary ?  ?[name] MRN: [id number] DOB: 01/14/1946  ?Admit date:     09/05/2021  ?Discharge date: 09/09/21  ?Discharge Physician: Fritzi Mandes  ? ?PCP: Dion Body, MD  ? ?Recommendations at discharge:  ? ?follow-up with your PCP and wanted to ?follow-up Dr. Tasia Catchings hematology oncology for iron deficiency anemia ? ?Discharge Diagnoses: ?iron deficiency anemia symptomatic status post transfusion ? ?Hospital Course: ? ?Michele Matthews is a 76 y.o. female with medical history significant of iron deficiency anemia, upper GI bleeding, hypertension, hyperlipidemia, COPD, stroke, Plavix, GERD, depression, anxiety, tobacco abuse, CHF with EF 25-30%, who presents with generalized weakness, lightheadedness. ?  ?Patient states that she has generalized weakness for several days, which has been progressively worsening.  She also has lightheadedness and dizziness.  No fall. ?  ?Acute on chronic iron deficiency anemia/symptomatic ?-- comes in with increasing shortness of breath  ?-- hgb 9.1 ?--came in with hgb with 5.2--2 units BT--9.5 ?-- G.I. consultation with Dr. Alice Reichert. Plans for EGD colonoscopy tomorrow ?-- held Plavix ?-- IV iron infusion. Will start PO iron supplement ?-- patient will follow-up with Dr. Tasia Catchings hematology after discharge. Dr. Tasia Catchings aware ?-- IV Protonix ?-- EGD            -Normal esophagus. ?                       - Gastritis. Biopsied. ?                       - 3 cm hiatal hernia. ?                       - Normal examined duodenum. ?                       - The examination was otherwise normal. ?-- Colonoscopy Non-bleeding internal hemorrhoids. ?                       - Diverticulosis in the sigmoid colon. ?                       - Two 7 to 8 mm polyps in the sigmoid colon, removed with a hot snare. Resected and retrieved. Clips (MR  ?                       conditional) were placed. ?                       - The examination was otherwise normal. ?-- Since no active bleeding noted on  G.I. workup will resume Plavix ?  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  Patient does not have leg edema JVD.  CHF seem to be compensated. ?-Continue home Lasix 20 mg daily ? ?UTI (urinary tract infection) ?- IV Rocephin--change to po kelfex, no symptoms ?-Follow-up urine culture--Klesiella species ?  ?Essential hypertension ?- IV hydralazine as needed ?-Coreg and lisinopril ?   ?Tobacco abuse ?- Nicotine patch ?-- advised smoking cessation ?  ?Hyperlipidemia, unspecified ?- Lipitor ?  ?GERD (gastroesophageal reflux disease) ?- On Protonix ?  ?History of CVA with residual deficit ?-Patient reports left arm numbness and right eye vision change.  CT head negative. ?-  Lipitor ?-  MRI-negative for stroke. Shows old stroke ?  ? COPD (chronic obstructive pulmonary disease) ([place]) ?Stable ?-Bronchodilators ? ?overall hemodynamically stable. Family wants to take patient home. Will discharged with home health. ?  ?  ?Procedures: EGD and colonoscopy ?Family communication : husband on the phone ?Consults : G.I. Dr. Alice Reichert ?CODE STATUS: full ?DVT Prophylaxis : SCD ?Level of care: Med-Surg ?Status is: Inpatient ?Remains inpatient appropriate because: anemia workup ?  ? ? ?Disposition: Home health ?Diet recommendation:  ?Discharge Diet Orders (From admission, onward)  ? ?  Start     Ordered  ? 09/09/21 0000  Diet - low sodium heart healthy       ? 09/09/21 2458  ? ?  ?  ? ?  ? ?Cardiac diet ?DISCHARGE MEDICATION: ?Allergies as of 09/09/2021   ?No Known Allergies ?  ? ?  ?Medication List  ?  ? ?TAKE these medications   ? ?acetaminophen 500 MG tablet ?Commonly known as: TYLENOL ?Take 2 tablets (1,000 mg total) by mouth 3 (three) times daily. ?  ?ascorbic acid 250 MG tablet ?Commonly known as: VITAMIN C ?Take 1 tablet (250 mg total) by mouth 2 (two) times daily. ?  ?atorvastatin 20 MG tablet ?Commonly known as: LIPITOR ?Take 20  mg by mouth daily. ?  ?busPIRone 7.5 MG tablet ?Commonly known as: BUSPAR ?Take 1 tablet (7.5 mg total) by mouth 2 (two) times daily. ?  ?carvedilol 3.125 MG tablet ?Commonly known as: COREG ?Take 1 tablet (3.125 mg total) by mouth 2 (two) times daily with a meal. ?  ?clopidogrel 75 MG tablet ?Commonly known as: PLAVIX ?Take 75 mg by mouth daily. ?  ?diphenhydramine-acetaminophen 25-500 MG Tabs tablet ?Commonly known as: TYLENOL PM ?Take 1 tablet by mouth at bedtime as needed. ?  ?feeding supplement Liqd ?Take 237 mLs by mouth 3 (three) times daily between meals. ?  ?ferrous sulfate 325 (65 FE) MG tablet ?Take 1 tablet (325 mg total) by mouth 2 (two) times daily with a meal. ?What changed: when to take this ?  ?furosemide 20 MG tablet ?Commonly known as: Lasix ?Take 1 tablet (20 mg total) by mouth daily. ?  ?hydrOXYzine 10 MG tablet ?Commonly known as: ATARAX ?Take 1 tablet (10 mg total) by mouth 2 (two) times daily as needed for anxiety (sleep). ?  ?lisinopril 5 MG tablet ?Commonly known as: ZESTRIL ?Take 5 mg by mouth daily. ?  ?multivitamin tablet ?Take 1 tablet by mouth daily. ?  ?nicotine 21 mg/24hr patch ?Commonly known as: NICODERM CQ - dosed in mg/24 hours ?Place 1 patch (21 mg total) onto the skin daily. ?  ?oxyCODONE 5 MG immediate release tablet ?Commonly known as: Oxy IR/ROXICODONE ?Take 1 tablet (5 mg total) by mouth every 4 (four) hours as needed for moderate pain. ?  ?pantoprazole 40 MG tablet ?Commonly known as: PROTONIX ?TAKE 1 TABLET EVERY DAY ?  ?senna-docusate 8.6-50 MG tablet ?Commonly known as: Senokot-S ?Take 2 tablets by mouth 2 (two) times daily. ?  ?sertraline 100 MG tablet ?Commonly known as: ZOLOFT ?Take 1 tablet (100 mg total) by mouth 2 (two) times daily. ?  ?VISINE OP ?Place 1 drop into both eyes 2 (two) times daily as needed (dry eyes). ?  ? ?  ? ? Follow-up Information   ? ? Dion Body, MD. Schedule an appointment as soon as possible for a visit in 1 week(s).   ?Specialty:  Family Medicine ?Why: hospital f/u ?Contact information: ?[address] [facility]  [place] [address] ?[phone] ? ? ?  ?  ? ? Earlie Server, MD. Schedule an appointment as soon as possible for a visit in 2 week(s).   ?Specialty: Oncology ?Why: iron def anemia ?Contact information: ?[place] [address] ?[phone] ? ? ?  ?  ? ?  ?  ? ?  ? ?Discharge Exam: ?Filed Weights  ? 09/05/21 0951 09/08/21 1609 09/09/21 0440  ?Weight: 71.7 kg 55.8 kg 53.8 kg  ? ? ? ?Condition at discharge: fair ? ?The results of significant diagnostics from this hospitalization (including imaging, microbiology, ancillary and laboratory) are listed below for reference.  ? ?Imaging Studies: ?CT HEAD WO CONTRAST (5MM) ? ?Result Date: 09/05/2021 ?CLINICAL DATA:  Generalized weakness 1 month with headache starting this morning. EXAM: CT HEAD WITHOUT CONTRAST TECHNIQUE: Contiguous axial images were obtained from the base of the skull through the vertex without intravenous contrast. RADIATION DOSE REDUCTION: This exam was performed according to the departmental dose-optimization program which includes automated exposure control, adjustment of the mA and/or kV according to patient size and/or use of iterative reconstruction technique. COMPARISON:  12/27/2015 FINDINGS: Brain: Ventricles and cisterns are within normal. CSF spaces are unremarkable. Evidence of patient's known old right MCA territory infarct and old left occipital infarct. No mass, mass effect, shift of midline structures or acute hemorrhage. No evidence of acute infarction. Vascular: No hyperdense vessel or unexpected calcification. Skull: Normal. Negative for fracture or focal lesion. Sinuses/Orbits: No acute finding. Other: None. IMPRESSION: 1. No acute findings. 2. Old bilateral infarcts as described. Electronically Signed   By: Marin Olp M.D.   On: 09/05/2021 13:05  ? ?MR BRAIN WO CONTRAST ? ?Result Date: 09/06/2021 ?CLINICAL DATA:  Acute  neurologic deficit acute neurologic deficit EXAM: MRI HEAD WITHOUT CONTRAST TECHNIQUE: Multiplanar, multiecho pulse sequences of the brain and surrounding structures were obtained without intravenous con

## 2021-09-09 NOTE — Discharge Instructions (Signed)
AVOID NSAIDS ?

## 2021-09-09 NOTE — TOC Progression Note (Signed)
Transition of Care (TOC) - Progression Note  ? ? ?Patient Details  ?Name: Michele Matthews ?MRN: [id number] ?Date of Birth: 03-22-1946 ? ?Transition of Care (TOC) CM/SW Contact  ?Conception Oms, RN ?Phone Number: ?09/09/2021, 9:03 AM ? ?Clinical Narrative:   patient is se t up with [facility] for [facility] services, DME delivered to the bedside ? ? ? ?Expected Discharge Plan: [place] ?Barriers to Discharge: Continued Medical Work up, SNF Pending bed offer, Insurance Authorization ? ?Expected Discharge Plan and Services ?Expected Discharge Plan: [place] ?  ?Discharge Planning Services: CM Consult ?  ?Living arrangements for the past 2 months: [place] ?Expected Discharge Date: 09/09/21               ?DME Arranged: 3-N-1 ?DME Agency: [employer] ?Date DME Agency Contacted: 09/08/21 ?  ?Representative spoke with at DME Agency: Intake ?HH Arranged: PT ?[place] Agency: [place] ?Date HH Agency Contacted: 09/08/21 ?Time [place]: [phone] ?Representative spoke with at [place]: [place] ? ? ?Social Determinants of Health (SDOH) Interventions ?  ? ?Readmission Risk Interventions ?   ? View : No data to display.  ?  ?  ?  ? ? ?

## 2021-09-09 NOTE — Progress Notes (Signed)
DISCHARGE NOTE: ? ?Pt given discharge instructions and script. Pt verbalized understanding. Pt waiting on husband for transportation home.  ?

## 2021-09-10 ENCOUNTER — Encounter: Payer: Self-pay | Admitting: Internal Medicine

## 2021-09-10 LAB — CULTURE, BLOOD (SINGLE): Culture: NO GROWTH

## 2021-09-12 LAB — SURGICAL PATHOLOGY

## 2021-09-15 NOTE — Anesthesia Postprocedure Evaluation (Signed)
Anesthesia Post Note ? ?[name] ? ?Procedure(s) Performed: ESOPHAGOGASTRODUODENOSCOPY (EGD) ?COLONOSCOPY WITH PROPOFOL ? ?Patient location during evaluation: PACU ?Anesthesia Type: General ?Level of consciousness: awake and alert ?Pain management: pain level controlled ?Vital Signs Assessment: post-procedure vital signs reviewed and stable ?Respiratory status: spontaneous breathing, nonlabored ventilation, respiratory function stable and patient connected to nasal cannula oxygen ?Cardiovascular status: blood pressure returned to baseline and stable ?Postop Assessment: no apparent nausea or vomiting ?Anesthetic complications: no ? ? ?No notable events documented. ? ? ?Last Vitals:  ?Vitals:  ? 09/09/21 0741 09/09/21 1207  ?BP: 140/72 111/68  ?Pulse: 96 67  ?Resp: 16 16  ?Temp: 36.8 ?C 36.6 ?C  ?SpO2: 97% 99%  ?  ?Last Pain:  ?Vitals:  ? 09/09/21 0827  ?TempSrc:   ?PainSc: 0-No pain  ? ? ?  ?  ?  ?  ?  ?  ? ?Molli Barrows ? ? ? ? ?

## 2021-09-16 ENCOUNTER — Inpatient Hospital Stay: Payer: Medicare HMO

## 2021-09-16 ENCOUNTER — Inpatient Hospital Stay: Payer: Medicare HMO | Admitting: Oncology

## 2021-09-29 DIAGNOSIS — N390 Urinary tract infection, site not specified: Secondary | ICD-10-CM | POA: Diagnosis not present

## 2021-09-29 DIAGNOSIS — R200 Anesthesia of skin: Secondary | ICD-10-CM | POA: Diagnosis not present

## 2021-09-29 DIAGNOSIS — H539 Unspecified visual disturbance: Secondary | ICD-10-CM | POA: Diagnosis not present

## 2021-09-29 DIAGNOSIS — I69398 Other sequelae of cerebral infarction: Secondary | ICD-10-CM | POA: Diagnosis not present

## 2021-09-29 DIAGNOSIS — I110 Hypertensive heart disease with heart failure: Secondary | ICD-10-CM | POA: Diagnosis not present

## 2021-09-29 DIAGNOSIS — I5042 Chronic combined systolic (congestive) and diastolic (congestive) heart failure: Secondary | ICD-10-CM | POA: Diagnosis not present

## 2021-09-29 DIAGNOSIS — J449 Chronic obstructive pulmonary disease, unspecified: Secondary | ICD-10-CM | POA: Diagnosis not present

## 2021-11-08 ENCOUNTER — Other Ambulatory Visit: Payer: Self-pay

## 2021-11-08 ENCOUNTER — Telehealth: Payer: Self-pay | Admitting: *Deleted

## 2021-11-08 DIAGNOSIS — I10 Essential (primary) hypertension: Secondary | ICD-10-CM

## 2021-11-08 NOTE — Patient Outreach (Signed)
Received a [employer] referral for Mrs. Krasowski . I have sent a SDOH referral to the Embedded Team.   [facility], [place], [place] Management [phone]

## 2021-11-08 NOTE — Telephone Encounter (Signed)
   Telephone encounter was:  Successful.  11/08/2021 Name: Michele Matthews MRN: [id number] DOB: [birth year]  Michele Matthews is a 76 y.o. year old female who is a primary care patient of Dion Body, MD . The community resource team was consulted for assistance with Food Insecurity  Patients husband assisted with talk about [employer] meals and MOW will research program availability call back in a few days   Care guide performed the following interventions: Patient provided with information about care guide support team and interviewed to confirm resource needs.  Follow Up Plan:  Care guide will follow up with patient by phone over the next days [place], Care Management  [phone] [address] , [place] [address] Email : [email or web address]

## 2021-11-10 ENCOUNTER — Telehealth: Payer: Self-pay | Admitting: *Deleted

## 2021-11-10 NOTE — Telephone Encounter (Signed)
   Telephone encounter was:  Unsuccessful.  11/10/2021 Name: Michele Matthews MRN: [id number] DOB: [birth year]  Unsuccessful outbound call made today to assist with:  Food Insecurity  Outreach Attempt:  2nd Attempt  A HIPAA compliant voice message was left requesting a return call.  Instructed patient to call back at   Instructed patient to call back at [phone]  at their earliest convenience. . [place], Care Management  [phone] [address] , [place] [address] Email : [email or web address]

## 2021-11-16 ENCOUNTER — Telehealth: Payer: Self-pay | Admitting: *Deleted

## 2021-11-16 NOTE — Telephone Encounter (Signed)
   Telephone encounter was:  Unsuccessful.  11/16/2021 Name: Michele Matthews MRN: [id number] DOB: Sep 10, 1945  Unsuccessful outbound call made today to assist with:  Food Insecurity  Outreach Attempt:  2nd Attempt  A HIPAA compliant voice message was left requesting a return call.  Instructed patient to call back at   Instructed patient to call back at [phone]  at their earliest convenience.   .Patient approved for meals on wheels in [place] , [place], Care Management  [phone] [address] , [place] [address] Email : Ashby [email or web address] [email or web address]-moran [email or web address]

## 2022-05-26 ENCOUNTER — Inpatient Hospital Stay: Payer: Medicare HMO

## 2022-05-26 ENCOUNTER — Other Ambulatory Visit: Payer: Self-pay

## 2022-05-26 ENCOUNTER — Encounter: Payer: Self-pay | Admitting: Internal Medicine

## 2022-05-26 ENCOUNTER — Emergency Department: Payer: Medicare HMO

## 2022-05-26 ENCOUNTER — Inpatient Hospital Stay
Admission: EM | Admit: 2022-05-26 | Discharge: 2022-06-14 | DRG: 253 | Disposition: A | Discharge status: Home Health | Payer: Medicare HMO | Attending: Student | Admitting: Student

## 2022-05-26 DIAGNOSIS — L089 Local infection of the skin and subcutaneous tissue, unspecified: Secondary | ICD-10-CM | POA: Diagnosis not present

## 2022-05-26 DIAGNOSIS — I70201 Unspecified atherosclerosis of native arteries of extremities, right leg: Secondary | ICD-10-CM | POA: Diagnosis present

## 2022-05-26 DIAGNOSIS — L89620 Pressure ulcer of left heel, unstageable: Secondary | ICD-10-CM | POA: Diagnosis present

## 2022-05-26 DIAGNOSIS — I69354 Hemiplegia and hemiparesis following cerebral infarction affecting left non-dominant side: Secondary | ICD-10-CM

## 2022-05-26 DIAGNOSIS — S91309A Unspecified open wound, unspecified foot, initial encounter: Secondary | ICD-10-CM | POA: Diagnosis not present

## 2022-05-26 DIAGNOSIS — Z820 Family history of epilepsy and other diseases of the nervous system: Secondary | ICD-10-CM

## 2022-05-26 DIAGNOSIS — I5042 Chronic combined systolic (congestive) and diastolic (congestive) heart failure: Secondary | ICD-10-CM | POA: Diagnosis present

## 2022-05-26 DIAGNOSIS — D62 Acute posthemorrhagic anemia: Secondary | ICD-10-CM | POA: Diagnosis not present

## 2022-05-26 DIAGNOSIS — J449 Chronic obstructive pulmonary disease, unspecified: Secondary | ICD-10-CM | POA: Diagnosis present

## 2022-05-26 DIAGNOSIS — M79672 Pain in left foot: Secondary | ICD-10-CM | POA: Diagnosis not present

## 2022-05-26 DIAGNOSIS — F17210 Nicotine dependence, cigarettes, uncomplicated: Secondary | ICD-10-CM | POA: Diagnosis present

## 2022-05-26 DIAGNOSIS — L03116 Cellulitis of left lower limb: Principal | ICD-10-CM | POA: Diagnosis present

## 2022-05-26 DIAGNOSIS — Z79899 Other long term (current) drug therapy: Secondary | ICD-10-CM | POA: Diagnosis not present

## 2022-05-26 DIAGNOSIS — I69328 Other speech and language deficits following cerebral infarction: Secondary | ICD-10-CM

## 2022-05-26 DIAGNOSIS — Z801 Family history of malignant neoplasm of trachea, bronchus and lung: Secondary | ICD-10-CM

## 2022-05-26 DIAGNOSIS — K219 Gastro-esophageal reflux disease without esophagitis: Secondary | ICD-10-CM | POA: Diagnosis present

## 2022-05-26 DIAGNOSIS — I70245 Atherosclerosis of native arteries of left leg with ulceration of other part of foot: Secondary | ICD-10-CM | POA: Diagnosis not present

## 2022-05-26 DIAGNOSIS — I708 Atherosclerosis of other arteries: Secondary | ICD-10-CM | POA: Diagnosis present

## 2022-05-26 DIAGNOSIS — E785 Hyperlipidemia, unspecified: Secondary | ICD-10-CM | POA: Diagnosis present

## 2022-05-26 DIAGNOSIS — Z23 Encounter for immunization: Secondary | ICD-10-CM | POA: Diagnosis not present

## 2022-05-26 DIAGNOSIS — Z01810 Encounter for preprocedural cardiovascular examination: Secondary | ICD-10-CM | POA: Diagnosis not present

## 2022-05-26 DIAGNOSIS — I110 Hypertensive heart disease with heart failure: Secondary | ICD-10-CM | POA: Diagnosis present

## 2022-05-26 DIAGNOSIS — Z89432 Acquired absence of left foot: Secondary | ICD-10-CM | POA: Diagnosis not present

## 2022-05-26 DIAGNOSIS — F418 Other specified anxiety disorders: Secondary | ICD-10-CM | POA: Diagnosis present

## 2022-05-26 DIAGNOSIS — T82856A Stenosis of peripheral vascular stent, initial encounter: Secondary | ICD-10-CM | POA: Diagnosis not present

## 2022-05-26 DIAGNOSIS — Z515 Encounter for palliative care: Secondary | ICD-10-CM

## 2022-05-26 DIAGNOSIS — K449 Diaphragmatic hernia without obstruction or gangrene: Secondary | ICD-10-CM | POA: Diagnosis not present

## 2022-05-26 DIAGNOSIS — R600 Localized edema: Secondary | ICD-10-CM | POA: Diagnosis present

## 2022-05-26 DIAGNOSIS — R531 Weakness: Secondary | ICD-10-CM | POA: Diagnosis not present

## 2022-05-26 DIAGNOSIS — I739 Peripheral vascular disease, unspecified: Secondary | ICD-10-CM | POA: Diagnosis present

## 2022-05-26 DIAGNOSIS — I700 Atherosclerosis of aorta: Secondary | ICD-10-CM | POA: Diagnosis not present

## 2022-05-26 DIAGNOSIS — Z01818 Encounter for other preprocedural examination: Secondary | ICD-10-CM | POA: Diagnosis not present

## 2022-05-26 DIAGNOSIS — I959 Hypotension, unspecified: Secondary | ICD-10-CM | POA: Diagnosis not present

## 2022-05-26 DIAGNOSIS — F419 Anxiety disorder, unspecified: Secondary | ICD-10-CM | POA: Diagnosis present

## 2022-05-26 DIAGNOSIS — F32A Depression, unspecified: Secondary | ICD-10-CM | POA: Diagnosis present

## 2022-05-26 DIAGNOSIS — Y838 Other surgical procedures as the cause of abnormal reaction of the patient, or of later complication, without mention of misadventure at the time of the procedure: Secondary | ICD-10-CM | POA: Diagnosis present

## 2022-05-26 DIAGNOSIS — J439 Emphysema, unspecified: Secondary | ICD-10-CM | POA: Diagnosis not present

## 2022-05-26 DIAGNOSIS — D151 Benign neoplasm of heart: Secondary | ICD-10-CM | POA: Diagnosis present

## 2022-05-26 DIAGNOSIS — Z720 Tobacco use: Secondary | ICD-10-CM | POA: Diagnosis present

## 2022-05-26 DIAGNOSIS — M7989 Other specified soft tissue disorders: Secondary | ICD-10-CM | POA: Diagnosis not present

## 2022-05-26 DIAGNOSIS — L97529 Non-pressure chronic ulcer of other part of left foot with unspecified severity: Secondary | ICD-10-CM | POA: Diagnosis present

## 2022-05-26 DIAGNOSIS — I2510 Atherosclerotic heart disease of native coronary artery without angina pectoris: Secondary | ICD-10-CM | POA: Diagnosis present

## 2022-05-26 DIAGNOSIS — Z136 Encounter for screening for cardiovascular disorders: Secondary | ICD-10-CM | POA: Diagnosis not present

## 2022-05-26 DIAGNOSIS — Z7902 Long term (current) use of antithrombotics/antiplatelets: Secondary | ICD-10-CM

## 2022-05-26 DIAGNOSIS — I70244 Atherosclerosis of native arteries of left leg with ulceration of heel and midfoot: Secondary | ICD-10-CM | POA: Diagnosis not present

## 2022-05-26 DIAGNOSIS — E871 Hypo-osmolality and hyponatremia: Secondary | ICD-10-CM | POA: Diagnosis present

## 2022-05-26 DIAGNOSIS — I10 Essential (primary) hypertension: Secondary | ICD-10-CM | POA: Diagnosis present

## 2022-05-26 DIAGNOSIS — Z803 Family history of malignant neoplasm of breast: Secondary | ICD-10-CM

## 2022-05-26 LAB — CBC WITH DIFFERENTIAL/PLATELET
Abs Immature Granulocytes: 0.03 10*3/uL (ref 0.00–0.07)
Basophils Absolute: 0 10*3/uL (ref 0.0–0.1)
Basophils Relative: 0 %
Eosinophils Absolute: 0.2 10*3/uL (ref 0.0–0.5)
Eosinophils Relative: 2 %
HCT: 43.1 % (ref 36.0–46.0)
Hemoglobin: 13.2 g/dL (ref 12.0–15.0)
Immature Granulocytes: 0 %
Lymphocytes Relative: 13 %
Lymphs Abs: 1.2 10*3/uL (ref 0.7–4.0)
MCH: 27.4 pg (ref 26.0–34.0)
MCHC: 30.6 g/dL (ref 30.0–36.0)
MCV: 89.4 fL (ref 80.0–100.0)
Monocytes Absolute: 0.6 10*3/uL (ref 0.1–1.0)
Monocytes Relative: 6 %
Neutro Abs: 7.4 10*3/uL (ref 1.7–7.7)
Neutrophils Relative %: 79 %
Platelets: 306 10*3/uL (ref 150–400)
RBC: 4.82 MIL/uL (ref 3.87–5.11)
RDW: 14.8 % (ref 11.5–15.5)
WBC: 9.5 10*3/uL (ref 4.0–10.5)
nRBC: 0 % (ref 0.0–0.2)

## 2022-05-26 LAB — BRAIN NATRIURETIC PEPTIDE: B Natriuretic Peptide: 284.4 pg/mL — ABNORMAL HIGH (ref 0.0–100.0)

## 2022-05-26 LAB — LACTIC ACID, PLASMA
Lactic Acid, Venous: 1.5 mmol/L (ref 0.5–1.9)
Lactic Acid, Venous: 1 mmol/L (ref 0.5–1.9)

## 2022-05-26 LAB — COMPREHENSIVE METABOLIC PANEL
ALT: 15 U/L (ref 0–44)
AST: 27 U/L (ref 15–41)
Albumin: 4 g/dL (ref 3.5–5.0)
Alkaline Phosphatase: 92 U/L (ref 38–126)
Anion gap: 11 (ref 5–15)
BUN: 20 mg/dL (ref 8–23)
CO2: 25 mmol/L (ref 22–32)
Calcium: 9.7 mg/dL (ref 8.9–10.3)
Chloride: 102 mmol/L (ref 98–111)
Creatinine, Ser: 0.78 mg/dL (ref 0.44–1.00)
GFR, Estimated: >60 mL/min (ref >60)
Glucose, Bld: 101 mg/dL — ABNORMAL HIGH (ref 70–99)
Potassium: 4.3 mmol/L (ref 3.5–5.1)
Sodium: 138 mmol/L (ref 135–145)
Total Bilirubin: 0.5 mg/dL (ref 0.3–1.2)
Total Protein: 8.1 g/dL (ref 6.5–8.1)

## 2022-05-26 LAB — APTT: aPTT: 38 seconds — ABNORMAL HIGH (ref 24–36)

## 2022-05-26 LAB — PROTIME-INR
INR: 1 (ref 0.8–1.2)
Prothrombin Time: 13.1 seconds (ref 11.4–15.2)

## 2022-05-26 LAB — SEDIMENTATION RATE: Sed Rate: 26 mm/hr (ref 0–30)

## 2022-05-26 LAB — C-REACTIVE PROTEIN: CRP: 2 mg/dL — ABNORMAL HIGH (ref <1.0)

## 2022-05-26 MED ORDER — OXYCODONE-ACETAMINOPHEN 5-325 MG PO TABS
1.0000 | ORAL_TABLET | ORAL | Status: DC | PRN
  Administered 2022-05-26 – 2022-06-07 (×13): 1 via ORAL
  Filled 2022-05-26 (×13): qty 1

## 2022-05-26 MED ORDER — SODIUM CHLORIDE 0.9 % IV BOLUS: 1000.0000 mL | Freq: Once | INTRAVENOUS | Status: DC

## 2022-05-26 MED ORDER — BUSPIRONE HCL 15 MG PO TABS
7.5000 mg | ORAL_TABLET | Freq: Two times a day (BID) | ORAL | Status: DC
  Administered 2022-05-26 – 2022-06-08 (×26): 7.5 mg via ORAL
  Filled 2022-05-26 (×28): qty 1

## 2022-05-26 MED ORDER — DIPHENHYDRAMINE-APAP (SLEEP) 25-500 MG PO TABS: 1.0000 | ORAL_TABLET | Freq: Every evening | ORAL | Status: DC | PRN

## 2022-05-26 MED ORDER — FUROSEMIDE 20 MG PO TABS
20.0000 mg | ORAL_TABLET | Freq: Every day | ORAL | Status: DC
  Administered 2022-05-27 – 2022-06-14 (×17): 20 mg via ORAL
  Filled 2022-05-26 (×17): qty 1

## 2022-05-26 MED ORDER — ONDANSETRON HCL 4 MG/2ML IJ SOLN
4.0000 mg | Freq: Once | INTRAMUSCULAR | Status: AC
  Administered 2022-05-26: 4 mg via INTRAVENOUS
  Filled 2022-05-26: qty 2

## 2022-05-26 MED ORDER — METRONIDAZOLE 500 MG/100ML IV SOLN
500.0000 mg | Freq: Two times a day (BID) | INTRAVENOUS | Status: DC
  Administered 2022-05-27 – 2022-06-04 (×17): 500 mg via INTRAVENOUS
  Filled 2022-05-26 (×17): qty 100

## 2022-05-26 MED ORDER — CARVEDILOL 3.125 MG PO TABS
3.1250 mg | ORAL_TABLET | Freq: Two times a day (BID) | ORAL | Status: DC
  Administered 2022-05-26 – 2022-06-08 (×27): 3.125 mg via ORAL
  Filled 2022-05-26 (×27): qty 1

## 2022-05-26 MED ORDER — INFLUENZA VAC A&B SA ADJ QUAD 0.5 ML IM PRSY
0.5000 mL | PREFILLED_SYRINGE | INTRAMUSCULAR | Status: AC
  Administered 2022-05-27: 0.5 mL via INTRAMUSCULAR
  Filled 2022-05-26: qty 0.5

## 2022-05-26 MED ORDER — ONDANSETRON HCL 4 MG/2ML IJ SOLN: 4.0000 mg | Freq: Three times a day (TID) | INTRAMUSCULAR | Status: DC | PRN

## 2022-05-26 MED ORDER — HYDRALAZINE HCL 20 MG/ML IJ SOLN
5.0000 mg | INTRAMUSCULAR | Status: DC | PRN
  Administered 2022-05-26 – 2022-05-29 (×2): 5 mg via INTRAVENOUS
  Filled 2022-05-26 (×2): qty 1

## 2022-05-26 MED ORDER — ACETAMINOPHEN 325 MG PO TABS
650.0000 mg | ORAL_TABLET | Freq: Four times a day (QID) | ORAL | Status: DC | PRN
  Administered 2022-05-28 – 2022-06-06 (×2): 650 mg via ORAL
  Filled 2022-05-26 (×2): qty 2

## 2022-05-26 MED ORDER — ADULT MULTIVITAMIN W/MINERALS CH
1.0000 | ORAL_TABLET | Freq: Every day | ORAL | Status: DC
  Administered 2022-05-27 – 2022-06-14 (×17): 1 via ORAL
  Filled 2022-05-26 (×17): qty 1

## 2022-05-26 MED ORDER — ATORVASTATIN CALCIUM 20 MG PO TABS
20.0000 mg | ORAL_TABLET | Freq: Every day | ORAL | Status: DC
  Administered 2022-05-27 – 2022-06-08 (×12): 20 mg via ORAL
  Filled 2022-05-26 (×12): qty 1

## 2022-05-26 MED ORDER — ENSURE ENLIVE PO LIQD
237.0000 mL | Freq: Three times a day (TID) | ORAL | Status: DC
  Administered 2022-05-26 – 2022-06-14 (×43): 237 mL via ORAL

## 2022-05-26 MED ORDER — NICOTINE 21 MG/24HR TD PT24
21.0000 mg | MEDICATED_PATCH | Freq: Every day | TRANSDERMAL | Status: DC
  Administered 2022-05-26 – 2022-06-08 (×13): 21 mg via TRANSDERMAL
  Filled 2022-05-26 (×14): qty 1

## 2022-05-26 MED ORDER — HYDROXYZINE HCL 10 MG PO TABS
10.0000 mg | ORAL_TABLET | Freq: Two times a day (BID) | ORAL | Status: DC | PRN
  Administered 2022-06-10 – 2022-06-11 (×4): 10 mg via ORAL
  Filled 2022-05-26 (×6): qty 1

## 2022-05-26 MED ORDER — METRONIDAZOLE 500 MG/100ML IV SOLN
500.0000 mg | Freq: Once | INTRAVENOUS | Status: AC
  Administered 2022-05-26: 500 mg via INTRAVENOUS
  Filled 2022-05-26: qty 100

## 2022-05-26 MED ORDER — MORPHINE SULFATE (PF) 2 MG/ML IV SOLN
2.0000 mg | INTRAVENOUS | Status: DC | PRN
  Administered 2022-05-26 – 2022-06-09 (×17): 2 mg via INTRAVENOUS
  Filled 2022-05-26 (×17): qty 1

## 2022-05-26 MED ORDER — SENNOSIDES-DOCUSATE SODIUM 8.6-50 MG PO TABS
2.0000 | ORAL_TABLET | Freq: Every day | ORAL | Status: DC | PRN
  Administered 2022-06-03: 2 via ORAL
  Filled 2022-05-26: qty 2

## 2022-05-26 MED ORDER — MORPHINE SULFATE (PF) 4 MG/ML IV SOLN
4.0000 mg | Freq: Once | INTRAVENOUS | Status: AC
  Administered 2022-05-26: 4 mg via INTRAVENOUS
  Filled 2022-05-26: qty 1

## 2022-05-26 MED ORDER — VANCOMYCIN HCL IN DEXTROSE 1-5 GM/200ML-% IV SOLN
1000.0000 mg | INTRAVENOUS | Status: DC
  Administered 2022-05-26 – 2022-06-03 (×9): 1000 mg via INTRAVENOUS
  Filled 2022-05-26 (×9): qty 200

## 2022-05-26 MED ORDER — ALBUTEROL SULFATE (2.5 MG/3ML) 0.083% IN NEBU: 3.0000 mL | INHALATION_SOLUTION | RESPIRATORY_TRACT | Status: DC | PRN

## 2022-05-26 MED ORDER — SERTRALINE HCL 50 MG PO TABS
100.0000 mg | ORAL_TABLET | Freq: Every day | ORAL | Status: DC
  Administered 2022-05-27 – 2022-06-14 (×17): 100 mg via ORAL
  Filled 2022-05-26 (×17): qty 2

## 2022-05-26 MED ORDER — HEPARIN SODIUM (PORCINE) 5000 UNIT/ML IJ SOLN
5000.0000 [IU] | Freq: Three times a day (TID) | INTRAMUSCULAR | Status: DC
  Administered 2022-05-26 – 2022-06-09 (×40): 5000 [IU] via SUBCUTANEOUS
  Filled 2022-05-26 (×39): qty 1

## 2022-05-26 MED ORDER — SODIUM CHLORIDE 0.9 % IV SOLN: 2.0000 g | Freq: Once | INTRAVENOUS | Status: DC

## 2022-05-26 MED ORDER — LISINOPRIL 5 MG PO TABS
5.0000 mg | ORAL_TABLET | Freq: Every day | ORAL | Status: DC
  Administered 2022-05-27 – 2022-06-08 (×12): 5 mg via ORAL
  Filled 2022-05-26 (×12): qty 1

## 2022-05-26 MED ORDER — VANCOMYCIN HCL IN DEXTROSE 1-5 GM/200ML-% IV SOLN: 1000.0000 mg | INTRAVENOUS | Status: DC

## 2022-05-26 MED ORDER — LORAZEPAM 2 MG/ML PO CONC
1.0000 mg | Freq: Once | ORAL | Status: AC
  Administered 2022-05-26: 1 mg via ORAL
  Filled 2022-05-26 (×2): qty 0.5

## 2022-05-26 MED ORDER — DM-GUAIFENESIN ER 30-600 MG PO TB12: 1.0000 | ORAL_TABLET | Freq: Two times a day (BID) | ORAL | Status: DC | PRN

## 2022-05-26 MED ORDER — PANTOPRAZOLE SODIUM 20 MG PO TBEC
20.0000 mg | DELAYED_RELEASE_TABLET | Freq: Every day | ORAL | Status: DC
  Administered 2022-05-26 – 2022-06-08 (×13): 20 mg via ORAL
  Filled 2022-05-26 (×14): qty 1

## 2022-05-26 MED ORDER — VANCOMYCIN HCL IN DEXTROSE 1-5 GM/200ML-% IV SOLN: 1000.0000 mg | Freq: Once | INTRAVENOUS | Status: DC

## 2022-05-26 MED ORDER — TETRAHYDROZOLINE HCL 0.05 % OP SOLN: 1.0000 [drp] | Freq: Two times a day (BID) | OPHTHALMIC | Status: DC | PRN

## 2022-05-26 MED ORDER — MELATONIN 5 MG PO TABS
2.5000 mg | ORAL_TABLET | Freq: Every evening | ORAL | Status: DC | PRN
  Administered 2022-05-29 – 2022-06-12 (×9): 2.5 mg via ORAL
  Filled 2022-05-26 (×9): qty 1

## 2022-05-26 MED ORDER — SODIUM CHLORIDE 0.9 % IV SOLN
1.0000 g | INTRAVENOUS | Status: DC
  Administered 2022-05-26 – 2022-06-03 (×9): 1 g via INTRAVENOUS
  Filled 2022-05-26 (×8): qty 10
  Filled 2022-05-26: qty 1

## 2022-05-26 NOTE — Consult Note (Signed)
Pharmacy Antibiotic Note  Michele Matthews is a 77 y.o. female admitted on 05/26/2022 with  L foot infection . PMH significant for PAD, HTN, HLD, COPD, CAD, CHF (EF 25-30%), stroke, depression with anxiety, upper GIB. Patient underwent TMA of L foot 11/30/20. Pharmacy has been consulted for vancomycin dosing.  Plan: Day 1 of antibiotics Give Vancomycin 1000 mg IV x1 followed by 1000 mg IV Q24H. Goal AUC 400-550. Expected AUC: 517 Expected Css min: 12.1 SCr used: 0.80 (actual 0.78)  Weight used: TBW (TBW<IBW,) Vd used: 0.72 (BMI 20.6) Patient is also on ceftriaxone 1 g IV Q24H and metronidazole 500 mg BID Continue to monitor renal function and follow culture results   Weight: 56.2 kg (124 lb)  Temp (24hrs), Avg:98.4 F (36.9 C), Min:98.4 F (36.9 C), Max:98.4 F (36.9 C)  Recent Labs  Lab 05/26/22 1354  WBC 9.5  CREATININE 0.78  LATICACIDVEN 1.5    CrCl cannot be calculated (Unknown ideal weight.).    No Known Allergies  Antimicrobials this admission: 1/25 Vancomycin >>  1/25 Ceftriaxone >>  1/25 Metronidazole >>   Dose adjustments this admission: N/A  Microbiology results: 1/25 BCx: IP  Thank you for allowing pharmacy to be a part of this patient's care.  Gretel Acre, PharmD PGY1 Pharmacy Resident 05/26/2022 5:01 PM

## 2022-05-26 NOTE — ED Provider Triage Note (Signed)
Emergency Medicine Provider Triage Evaluation Note  Michele Matthews , a 77 y.o. female  was evaluated in triage.  Pt complains of left foot pain. 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 She has had a wound on the heel for about 2 months. Foot and leg are now slightly swollen and red.   Physical Exam  BP (!) 166/72 (BP Location: Left Arm)   Pulse 97   Temp 98.4 F (36.9 C) (Oral)   Resp 18   SpO2 100%  Gen:   Awake, no distress   Resp:  Normal effort  MSK:   Moves extremities without difficulty  Other:    Medical Decision Making  Medically screening exam initiated at 1:51 PM.  Appropriate orders placed.  Michele Matthews was informed that the remainder of the evaluation will be completed by another provider, this initial triage assessment does not replace that evaluation, and the importance of remaining in the ED until their evaluation is complete.   Victorino Dike, FNP 05/26/22 1353

## 2022-05-26 NOTE — Consult Note (Signed)
PHARMACY -  BRIEF ANTIBIOTIC NOTE   Pharmacy has received consult(s) for cellulitis  from an ED provider.  The patient's profile has been reviewed for ht/wt/allergies/indication/available labs.    One time order(s) placed for cefepime and vancomycin  Further antibiotics/pharmacy consults should be ordered by admitting physician if indicated.                       Thank you, Oswald Hillock, PharmD, BCPS 05/26/2022  4:05 PM

## 2022-05-26 NOTE — H&P (Signed)
History and Physical    Michele Matthews SEG:[id number] DOB: [birth year] DOA: 05/26/2022  Referring MD/NP/PA:   PCP: Michele Body, MD   Patient coming from:  The patient is coming from home.      Chief Complaint: left foot pain  HPI: Michele Matthews is a 77 y.o. 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  The pain is constant, sharp, moderate to severe, nonradiating. She has worsening left heel ulcer for several weeks, with mal odor from left foot drainage.  No fever or chills.  Patient does not have chest pain, cough, shortness breath.  No nausea vomiting, diarrhea or abdominal pain.  No symptoms of UTI.  Patient has asymmetric leg edema, right leg is swelling.  No recent fall or head injury.  No rectal bleeding or dark stool.  Data reviewed independently and ED Course: pt was found to have WBC 9.5, GFR> 60, temperature normal, blood pressure 166/72, heart rate 97, RR 18, oxygen saturation 100% on room air.  Chest x-ray is negative for infiltration, but showed hiatal hernia.  X-ray of left foot is negative for osteomyelitis.  Patient is admitted to telemetry bed as inpatient.  Dr. Posey Pronto of podiatry is consulted.   EKG:  Not done in ED, will get one.    Review of Systems:   General: no fevers, chills, no Matthews weight gain, has fatigue HEENT: no blurry vision, hearing changes or sore throat Respiratory: no dyspnea, coughing, wheezing CV: no chest pain, no palpitations GI: no nausea, vomiting, abdominal pain, diarrhea, constipation GU: no dysuria, burning on urination, increased urinary frequency, hematuria  Ext: has right leg edema and left foot pain  Neuro: no unilateral  weakness, numbness, or tingling, no vision change or hearing loss Skin: has left  foot heel ulcer MSK: No muscle spasm, no deformity, no limitation of range of movement in spin Heme: No easy bruising.  Travel history: No recent long distant travel.   Allergy: No Known Allergies  Past Medical History:  Diagnosis Date   Allergy    COPD (chronic obstructive pulmonary disease) (HCC)    Coronary artery disease    Depression    Emphysema of lung (HCC)    GERD (gastroesophageal reflux disease)    Hyperlipidemia    Hypertension    Seizures ([place])    Stroke [facility])     Past Surgical History:  Procedure Laterality Date   COLONOSCOPY WITH PROPOFOL N/A 09/08/2021   Procedure: COLONOSCOPY WITH PROPOFOL;  Surgeon: Toledo, Benay Pike, MD;  Location: [facility] ENDOSCOPY;  Service: Gastroenterology;  Laterality: N/A;   ESOPHAGOGASTRODUODENOSCOPY N/A 08/10/2015   Procedure: ESOPHAGOGASTRODUODENOSCOPY (EGD);  Surgeon: Manya Silvas, MD;  Location: [facility] ENDOSCOPY;  Service: Endoscopy;  Laterality: N/A;   ESOPHAGOGASTRODUODENOSCOPY N/A 09/08/2021   Procedure: ESOPHAGOGASTRODUODENOSCOPY (EGD);  Surgeon: Toledo, Benay Pike, MD;  Location: [facility] ENDOSCOPY;  Service: Gastroenterology;  Laterality: N/A;   ESOPHAGOGASTRODUODENOSCOPY (EGD) WITH PROPOFOL N/A 12/11/2014   Procedure: ESOPHAGOGASTRODUODENOSCOPY (EGD) WITH PROPOFOL;  Surgeon: Lucilla Lame, MD;  Location: [facility] ENDOSCOPY;  Service: Endoscopy;  Laterality: N/A;   ESOPHAGOGASTRODUODENOSCOPY (EGD) WITH PROPOFOL N/A 12/29/2015   Procedure: ESOPHAGOGASTRODUODENOSCOPY (EGD) WITH PROPOFOL;  Surgeon: Mauri Pole, MD;  Location: [place];  Service:  Endoscopy;  Laterality: N/A;   LOWER EXTREMITY ANGIOGRAPHY Left 11/25/2020   Procedure: Lower Extremity Angiography;  Surgeon: Algernon Huxley, MD;  Location: [place] CV LAB;  Service: Cardiovascular;  Laterality: Left;   LOWER EXTREMITY ANGIOGRAPHY Left 11/30/2020   Procedure: Lower Extremity Angiography;   Surgeon: Algernon Huxley, MD;  Location: [place] CV LAB;  Service: Cardiovascular;  Laterality: Left;   TRANSMETATARSAL AMPUTATION Left 12/02/2020   Procedure: TRANSMETATARSAL AMPUTATION;  Surgeon: Caroline More, DPM;  Location: [facility] ORS;  Service: Podiatry;  Laterality: Left;    Social History:  reports that she has been smoking. She has been smoking an average of 1 pack per day. She uses smokeless tobacco. She reports that she does not drink alcohol and does not use drugs.  Family History:  Family History  Problem Relation Age of Onset   Alzheimer's disease Father    Lung cancer Sister    Breast cancer Sister    Cancer Mother      Prior to Admission medications   Medication Sig Start Date End Date Taking? Authorizing Provider  acetaminophen (TYLENOL) 500 MG tablet Take 2 tablets (1,000 mg total) by mouth 3 (three) times daily. 12/08/20   Danford, Suann Larry, MD  ascorbic acid (VITAMIN C) 250 MG tablet Take 1 tablet (250 mg total) by mouth 2 (two) times daily. 12/08/20   Danford, Suann Larry, MD  atorvastatin (LIPITOR) 20 MG tablet Take 20 mg by mouth daily. 08/26/21   [provider]  busPIRone (BUSPAR) 7.5 MG tablet Take 1 tablet (7.5 mg total) by mouth 2 (two) times daily. 11/03/16   Karamalegos, Devonne Doughty, DO  carvedilol (COREG) 3.125 MG tablet Take 1 tablet (3.125 mg total) by mouth 2 (two) times daily with a meal. 07/27/17   Gladstone Lighter, MD  clopidogrel (PLAVIX) 75 MG tablet Take 75 mg by mouth daily. 11/16/20   [provider]  diphenhydramine-acetaminophen (TYLENOL PM) 25-500 MG TABS tablet Take 1 tablet by mouth at bedtime as needed.    [provider]  feeding supplement, ENSURE ENLIVE, (ENSURE ENLIVE) LIQD Take 237 mLs by mouth 3 (three) times daily between meals. 07/27/17   Gladstone Lighter, MD  ferrous sulfate 325 (65 FE) MG tablet Take 1 tablet (325 mg total) by mouth 2 (two) times daily with a meal. 09/09/21   Fritzi Mandes, MD  furosemide  (LASIX) 20 MG tablet Take 1 tablet (20 mg total) by mouth daily. 07/27/17   Gladstone Lighter, MD  hydrOXYzine (ATARAX/VISTARIL) 10 MG tablet Take 1 tablet (10 mg total) by mouth 2 (two) times daily as needed for anxiety (sleep). 11/03/16   Karamalegos, Devonne Doughty, DO  lisinopril (ZESTRIL) 5 MG tablet Take 5 mg by mouth daily. 10/08/20   [provider]  Multiple Vitamin (MULTIVITAMIN) tablet Take 1 tablet by mouth daily.    [provider]  nicotine (NICODERM CQ - DOSED IN MG/24 HOURS) 21 mg/24hr patch Place 1 patch (21 mg total) onto the skin daily. 12/09/20   Danford, Suann Larry, MD  oxyCODONE (OXY IR/ROXICODONE) 5 MG immediate release tablet Take 1 tablet (5 mg total) by mouth every 4 (four) hours as needed for moderate pain. 12/08/20   Edwin Dada, MD  pantoprazole (PROTONIX) 40 MG tablet TAKE 1 TABLET EVERY DAY Patient not taking: Reported on 09/05/2021 03/31/17   Mikey College, NP  senna-docusate (SENOKOT-S) 8.6-50 MG tablet Take 2 tablets by mouth 2 (two) times daily. 12/08/20   Danford, Suann Larry, MD  sertraline (ZOLOFT) 100 MG tablet Take 1 tablet (100 mg total) by mouth 2 (two) times daily. 07/15/16   Karamalegos, Devonne Doughty, DO  Tetrahydrozoline HCl (VISINE OP) Place 1 drop into both eyes 2 (two) times daily as needed (dry eyes).    [provider]    Physical Exam: Vitals:   05/26/22 1351 05/26/22 1630  BP: (!) 166/72 (!) 181/86  Pulse: 97 (!) 101  Resp: 18 19  Temp: 98.4 F (36.9 C)   TempSrc: Oral   SpO2: 100% 98%  Weight: 56.2 kg   Height: '5\' 5"'$  (1.651 m)    General: Not in acute distress HEENT:       Eyes: PERRL, EOMI, no scleral icterus.       ENT: No discharge from the ears and nose, no pharynx injection, no tonsillar enlargement.        Neck: No JVD, no bruit, no mass felt. Heme: No neck lymph node enlargement. Cardiac: S1/S2, RRR, No murmurs, No gallops or rubs. Respiratory: No rales, wheezing, rhonchi or rubs. GI:  Soft, nondistended, nontender, no rebound pain, no organomegaly, BS present. GU: No hematuria Ext: the left foot is swollen, mild erythema, tender, with little drainage in front of left foot, has a deep wound/ulcer in the left heel.  No edema on the left leg, has 2+ right leg edema        Musculoskeletal: No joint deformities, No joint redness or warmth, no limitation of ROM in spin. Skin: No rashes.  Neuro: Alert, oriented X3, cranial nerves II-XII grossly intact, moves all extremitie. Psych: Patient is not psychotic, no suicidal or hemocidal ideation.  Labs on Admission: I have personally reviewed following labs and imaging studies  CBC: Recent Labs  Lab 05/26/22 1354  WBC 9.5  NEUTROABS 7.4  HGB 13.2  HCT 43.1  MCV 89.4  PLT 740   Basic Metabolic Panel: Recent Labs  Lab 05/26/22 1354  NA 138  K 4.3  CL 102  CO2 25  GLUCOSE 101*  BUN 20  CREATININE 0.78  CALCIUM 9.7   GFR: Estimated Creatinine Clearance: 52.2 mL/min (by C-G formula based on SCr of 0.78 mg/dL). Liver Function Tests: Recent Labs  Lab 05/26/22 1354  AST 27  ALT 15  ALKPHOS 92  BILITOT 0.5  PROT 8.1  ALBUMIN 4.0   No results for input(s): "LIPASE", "AMYLASE" in the last 168 hours. No results for input(s): "AMMONIA" in the last 168 hours. Coagulation Profile: Recent Labs  Lab 05/26/22 1354  INR 1.0   Cardiac Enzymes: No results for input(s): "CKTOTAL", "CKMB", "CKMBINDEX", "TROPONINI" in the last 168 hours. BNP (last 3 results) No results for input(s): "PROBNP" in the last 8760 hours. HbA1C: No results for input(s): "HGBA1C" in the last 72 hours. CBG: No results for input(s): "GLUCAP" in the last 168 hours. Lipid Profile: No results for input(s): "CHOL", "HDL", "LDLCALC", "TRIG", "CHOLHDL", "LDLDIRECT" in the last 72 hours. Thyroid Function Tests: No results for input(s): "TSH", "T4TOTAL", "FREET4", "T3FREE", "THYROIDAB" in the last 72 hours. Anemia Panel: No results for  input(s): "VITAMINB12", "FOLATE", "FERRITIN", "TIBC", "IRON", "RETICCTPCT" in the last 72 hours. Urine analysis:    Component Value Date/Time   COLORURINE AMBER (A) 09/05/2021 0954   APPEARANCEUR CLEAR (A) 09/05/2021 0954   APPEARANCEUR Hazy 03/27/2014 1845   LABSPEC 1.018 09/05/2021 0954   LABSPEC 1.028 03/27/2014 1845   PHURINE 6.0 09/05/2021 0954   GLUCOSEU NEGATIVE 09/05/2021 0954   GLUCOSEU Negative 03/27/2014 1845   HGBUR NEGATIVE 09/05/2021  [place] 09/05/2021 0954   BILIRUBINUR Negative 03/27/2014 1845   KETONESUR 5 (A) 09/05/2021 [place] 09/05/2021 0954   NITRITE POSITIVE (A) 09/05/2021 0954   LEUKOCYTESUR NEGATIVE 09/05/2021 0954   LEUKOCYTESUR 3+ 03/27/2014 1845   Sepsis Labs: '@LABRCNTIP'$ (procalcitonin:4,lacticidven:4) )No results found for this or any previous visit (from the past 240 hour(s)).   Radiological Exams on Admission: DG Foot 2 Views Left  Result Date: 05/26/2022 CLINICAL DATA:  Pain and swelling EXAM: LEFT FOOT - 2 VIEW COMPARISON:  12/02/2020. FINDINGS: Patient is status post amputation of the first through fifth proximal metatarsals. No gross bony destructive process identified. Osseous structures are osteopenic. No traumatic osseous abnormalities. IMPRESSION: No gross abnormalities identified status post first through fifth proximal metatarsal amputations. Electronically Signed   By: Sammie Bench M.D.   On: 05/26/2022 16:18   DG Chest 2 View  Result Date: 05/26/2022 CLINICAL DATA:  Suspected Sepsis EXAM: CHEST - 2 VIEW COMPARISON:  11/29/2020. FINDINGS: The heart size and mediastinal contours are within normal limits. Both lungs are clear. No pneumothorax or pleural effusion. Aorta is calcified. There are thoracic degenerative changes. Retrocardiac opacity noted with a air-fluid level consistent with a moderate hiatal hernia. IMPRESSION: No active cardiopulmonary disease.  Moderate-sized hiatal hernia. Electronically  Signed   By: Sammie Bench M.D.   On: 05/26/2022 14:29      Assessment/Plan Principal Problem:   Left foot infection Active Problems:   Chronic combined systolic and diastolic heart failure (HCC)   COPD (chronic obstructive pulmonary disease) (HCC)   Coronary artery disease   Essential hypertension   PAD (peripheral artery disease) (HCC)   HLD (hyperlipidemia)   Depression with anxiety   Leg edema, right   Tobacco abuse   Assessment and Plan:  Left foot infection: not septic. Consulted Dr. Posey Pronto of podiatry.  - will admit to tele bed as inpatient - Empiric antimicrobial treatment with vancomycin Flagyl, Rocephin - PRN Zofran for nausea, tylenol and Percocet for pain - Blood cultures x 2  - ESR and CRP - wound care consult - MRI-left foot - INR/PTT  Chronic combined systolic and diastolic heart failure ([place]): 2D echo on 07/24/2017 showed EF 25 to 30% with grade 2 diastolic dysfunction.  Patient has asymmetric leg edema, no left leg edema, has 2+ right leg edema.  No shortness of breath, CHF seem to be compensated. -Check BNP -Continue home Lasix 20 mg daily  COPD (chronic obstructive pulmonary disease) (HCC): Stable -Bronchodilators  Coronary artery disease: No chest pain -Continue Lipitor -Hold Plavix in case patient need procedure  Essential hypertension -IV hydralazine as needed -Coreg, lisinopril  PAD (peripheral artery disease) (HCC) -Hold Plavix -On Lipitor -f/u ABI  HLD (hyperlipidemia) -Lipitor  Depression with anxiety -Continue home medications   Leg edema, right -LE doppler to r/o DVT  Tobacco abuse -nicotine patch    DVT ppx: SQ Heparin     Code Status: Partial code (I discussed with the patient in the presence of her nurse, and explained the meaning of CODE STATUS, patient wants to be partial code, OK for CPR, but no intubation).    Family Communication:   Yes, patient's  husband   by phone  Disposition Plan:  Anticipate discharge  back to previous environment  Consults called:  Dr. Posey Pronto of podiatry is consulted.  Admission status and Level of care: Telemetry Medical:   as inpt       Dispo: The patient is from: Home  Anticipated d/c is to: Home              Anticipated d/c date is: 2 days              Patient currently is not medically stable to d/c.    Severity of Illness:  The appropriate patient status for this patient is INPATIENT. Inpatient status is judged to be reasonable and necessary in order to provide the required intensity of service to ensure the patient's safety. The patient's presenting symptoms, physical exam findings, and initial radiographic and laboratory data in the context of their chronic comorbidities is felt to place them at high risk for further clinical deterioration. Furthermore, it is not anticipated that the patient will be medically stable for discharge from the hospital within 2 midnights of admission.   * I certify that at the point of admission it is my clinical judgment that the patient will require inpatient hospital care spanning beyond 2 midnights from the point of admission due to high intensity of service, high risk for further deterioration and high frequency of surveillance required.*       Date of Service 05/26/2022    Ivor Costa [facility]   If 7PM-7AM, please contact night-coverage [email or web address] 05/26/2022, 5:28 PM

## 2022-05-26 NOTE — ED Triage Notes (Addendum)
Pt arrives via EMS. Pt sts that she has had her toes removed on the left foot. Pt sts that she is having a heel infection on the same foot for the last two months. Mal odor from pt left foot.

## 2022-05-26 NOTE — ED Provider Notes (Signed)
[facility] Provider Note    Event Date/Time   First MD Initiated Contact with Patient 05/26/22 1615     (approximate)   History   Chief Complaint: Wound Infection   HPI  Michele Matthews is a 77 y.o. female with a history of COPD, hypertension, PAD, GERD, CVA who comes ED complaining of left foot pain and swelling, gradual onset for the last 3 weeks, associated with wounds on the foot.  Denies fever chills chest pain shortness of breath.  Has a history of transmetatarsal amputation of the left foot 1.5 years ago, has not followed up with podiatry since then.     Physical Exam   Triage Vital Signs: ED Triage Vitals [05/26/22 1351]  Enc Vitals Group     BP (!) 166/72     Pulse Rate 97     Resp 18     Temp 98.4 F (36.9 C)     Temp Source Oral     SpO2 100 %     Weight 124 lb (56.2 kg)     Height '5\' 5"'$  (1.651 m)     Head Circumference      Peak Flow      Pain Score 4     Pain Loc      Pain Edu?      Excl. in [place]?     Most recent vital signs: Vitals:   05/26/22 1630 05/26/22 1700  BP: (!) 181/86 (!) 188/76  Pulse: (!) 101 (!) 105  Resp: 19 17  Temp:    SpO2: 98% 95%    General: Awake, no distress.  CV:  Good peripheral perfusion.  Tachycardia, heart rate 105.  Intact DP pulse bilaterally.  Intact capillary refill. Resp:  Normal effort.  Clear to auscultation bilaterally Abd:  No distention.  Other:  Right foot appears grossly normal.  Left foot status post TMA.  Multiple ulcerations from the distal foot stump.  There is fibrinous exudate, erythema, swelling, tenderness.  No crepitus.  There is also a necrotic ulceration in the heel.  See media tab for photos.  Wounds are malodorous.   ED Results / Procedures / Treatments   Labs (all labs ordered are listed, but only abnormal results are displayed) Labs Reviewed  COMPREHENSIVE METABOLIC PANEL - Abnormal; Notable for the following components:      Result Value   Glucose, Bld 101 (*)     All other components within normal limits  APTT - Abnormal; Notable for the following components:   aPTT 38 (*)    All other components within normal limits  CULTURE, BLOOD (ROUTINE X 2)  CULTURE, BLOOD (ROUTINE X 2)  LACTIC ACID, PLASMA  CBC WITH DIFFERENTIAL/PLATELET  PROTIME-INR  SEDIMENTATION RATE  LACTIC ACID, PLASMA  URINALYSIS, ROUTINE W REFLEX MICROSCOPIC  C-REACTIVE PROTEIN  BRAIN NATRIURETIC PEPTIDE  BASIC METABOLIC PANEL  CBC     EKG    RADIOLOGY X-ray left foot interpreted by me, bones appear normal.  Radiology report reviewed. Chest x-ray unremarkable   PROCEDURES:  Procedures   MEDICATIONS ORDERED IN ED: Medications  metroNIDAZOLE (FLAGYL) IVPB 500 mg (500 mg Intravenous New Bag/Given 05/26/22 1723)  vancomycin (VANCOCIN) IVPB 1000 mg/200 mL premix (has no administration in time range)  cefTRIAXone (ROCEPHIN) 1 g in sodium chloride 0.9 % 100 mL IVPB (0 g Intravenous Stopped 05/26/22 1720)  metroNIDAZOLE (FLAGYL) IVPB 500 mg (has no administration in time range)  albuterol (PROVENTIL) (2.5 MG/3ML) 0.083% nebulizer solution 3 mL (  has no administration in time range)  dextromethorphan-guaiFENesin (MUCINEX DM) 30-600 MG per 12 hr tablet 1 tablet (has no administration in time range)  nicotine (NICODERM CQ - dosed in mg/24 hours) patch 21 mg (has no administration in time range)  ondansetron (ZOFRAN) injection 4 mg (has no administration in time range)  hydrALAZINE (APRESOLINE) injection 5 mg (5 mg Intravenous Given 05/26/22 1734)  acetaminophen (TYLENOL) tablet 650 mg (has no administration in time range)  oxyCODONE-acetaminophen (PERCOCET/ROXICET) 5-325 MG per tablet 1 tablet (has no administration in time range)  heparin injection 5,000 Units (has no administration in time range)  vancomycin (VANCOCIN) IVPB 1000 mg/200 mL premix (has no administration in time range)  atorvastatin (LIPITOR) tablet 20 mg (has no administration in time range)  carvedilol  (COREG) tablet 3.125 mg (has no administration in time range)  furosemide (LASIX) tablet 20 mg (has no administration in time range)  lisinopril (ZESTRIL) tablet 5 mg (has no administration in time range)  busPIRone (BUSPAR) tablet 7.5 mg (has no administration in time range)  diphenhydramine-acetaminophen (TYLENOL PM) 25-500 MG per tablet 1 tablet (has no administration in time range)  hydrOXYzine (ATARAX) tablet 10 mg (has no administration in time range)  sertraline (ZOLOFT) tablet 100 mg (has no administration in time range)  pantoprazole (PROTONIX) EC tablet 20 mg (has no administration in time range)  senna-docusate (Senokot-S) tablet 2 tablet (has no administration in time range)  feeding supplement (ENSURE ENLIVE / ENSURE PLUS) liquid 237 mL (has no administration in time range)  multivitamin with minerals tablet 1 tablet (has no administration in time range)  tetrahydrozoline 0.05 % ophthalmic solution 1 drop (has no administration in time range)  LORazepam (ATIVAN) 2 MG/ML concentrated solution 1 mg (has no administration in time range)  morphine (PF) 4 MG/ML injection 4 mg (4 mg Intravenous Given 05/26/22 1633)  ondansetron (ZOFRAN) injection 4 mg (4 mg Intravenous Given 05/26/22 1633)     IMPRESSION / MDM / ASSESSMENT AND PLAN / ED COURSE  I reviewed the triage vital signs and the nursing notes.                              Differential diagnosis includes, but is not limited to, left foot cellulitis, left foot abscess, osteomyelitis, critical PAD  Patient's presentation is most consistent with acute presentation with potential threat to life or bodily function.  Patient presents with wounds on the left foot, concerning for infection.  No evidence of necrotizing fasciitis.  Patient is not septic.  Patient started on broad-spectrum antibiotics.  Hospitalist and podiatry contacted for further evaluation and management.       FINAL CLINICAL IMPRESSION(S) / ED DIAGNOSES    Final diagnoses:  Cellulitis of left foot     Rx / DC Orders   ED Discharge Orders     None        Note:  This document was prepared using Dragon voice recognition software and may include unintentional dictation errors.   Carrie Mew, MD 05/26/22 [id number]

## 2022-05-26 NOTE — ED Triage Notes (Signed)
First RN note-  Pt BIB [facility] due to left  heal injury, post toes removal over 1 year ago.  As per EMS, pt has had 2 appointments to get the foot looked at but has canceled.   Pt is from home.   EMS vitals- 53F oral 90-HR 146/78

## 2022-05-26 NOTE — ED Notes (Signed)
Pt states she will need anxiety medication for mri, notified md

## 2022-05-27 ENCOUNTER — Inpatient Hospital Stay: Payer: Medicare HMO

## 2022-05-27 DIAGNOSIS — I5042 Chronic combined systolic (congestive) and diastolic (congestive) heart failure: Secondary | ICD-10-CM | POA: Diagnosis not present

## 2022-05-27 DIAGNOSIS — L089 Local infection of the skin and subcutaneous tissue, unspecified: Secondary | ICD-10-CM

## 2022-05-27 DIAGNOSIS — I739 Peripheral vascular disease, unspecified: Secondary | ICD-10-CM | POA: Diagnosis not present

## 2022-05-27 LAB — BASIC METABOLIC PANEL
Anion gap: 7 (ref 5–15)
BUN: 17 mg/dL (ref 8–23)
CO2: 28 mmol/L (ref 22–32)
Calcium: 9.7 mg/dL (ref 8.9–10.3)
Chloride: 102 mmol/L (ref 98–111)
Creatinine, Ser: 0.63 mg/dL (ref 0.44–1.00)
GFR, Estimated: >60 mL/min (ref >60)
Glucose, Bld: 118 mg/dL — ABNORMAL HIGH (ref 70–99)
Potassium: 3.9 mmol/L (ref 3.5–5.1)
Sodium: 137 mmol/L (ref 135–145)

## 2022-05-27 LAB — CBC
HCT: 38.1 % (ref 36.0–46.0)
Hemoglobin: 11.9 g/dL — ABNORMAL LOW (ref 12.0–15.0)
MCH: 27.8 pg (ref 26.0–34.0)
MCHC: 31.2 g/dL (ref 30.0–36.0)
MCV: 89 fL (ref 80.0–100.0)
Platelets: 270 10*3/uL (ref 150–400)
RBC: 4.28 MIL/uL (ref 3.87–5.11)
RDW: 14.6 % (ref 11.5–15.5)
WBC: 12.9 10*3/uL — ABNORMAL HIGH (ref 4.0–10.5)
nRBC: 0 % (ref 0.0–0.2)

## 2022-05-27 MED ORDER — LORAZEPAM 2 MG/ML IJ SOLN
1.0000 mg | Freq: Once | INTRAMUSCULAR | Status: AC
  Administered 2022-05-27: 1 mg via INTRAVENOUS
  Filled 2022-05-27: qty 1

## 2022-05-27 NOTE — Consult Note (Signed)
Pharmacy Antibiotic Note  Michele Matthews is a 77 y.o. female admitted on 05/26/2022 with  L foot infection . PMH significant for PAD, HTN, HLD, COPD, CAD, CHF (EF 25-30%), stroke, depression with anxiety, upper GIB. Patient underwent TMA of L foot 11/30/20. Pharmacy has been consulted for vancomycin dosing.  -f/u ?MRI/?vascular assessment  Plan: Day 2 of antibiotics  Vancomycin 1000 mg IV Q24H. Goal AUC 400-550. Expected AUC: 517 Expected Css min: 12.1 SCr used: 0.80 (actual 0.78)  Weight used: TBW (TBW<IBW,) Vd used: 0.72 (BMI 20.6) -also on ceftriaxone 1 g IV Q24H and metronidazole 500 mg BID Continue to monitor renal function and follow culture results   Height: '5\' 5"'$  (165.1 cm) Weight: 56.2 kg (124 lb) IBW/kg (Calculated) : 57  Temp (24hrs), Avg:98.3 F (36.8 C), Min:98 F (36.7 C), Max:98.7 F (37.1 C)  Recent Labs  Lab 05/26/22 1354 05/26/22 1903 05/27/22 0416  WBC 9.5  --  12.9*  CREATININE 0.78  --  0.63  LATICACIDVEN 1.5 1.0  --      Estimated Creatinine Clearance: 52.2 mL/min (by C-G formula based on SCr of 0.63 mg/dL).    No Known Allergies  Antimicrobials this admission: 1/25 Vancomycin >>  1/25 Ceftriaxone >>  1/25 Metronidazole >>   Dose adjustments this admission: N/A  Microbiology results: 1/25 BCx: NG<12 hrs  Thank you for allowing pharmacy to be a part of this patient's care.  Chinita Greenland PharmD Clinical Pharmacist 05/27/2022

## 2022-05-27 NOTE — Progress Notes (Signed)
   05/27/22 1135  Assess: MEWS Score  Temp 97.7 F (36.5 C)  BP 119/68  MAP (mmHg) 82  Pulse Rate (!) 107  Resp 16  SpO2 91 %  O2 Device Room Air  Assess: MEWS Score  MEWS Temp 0  MEWS Systolic 0  MEWS Pulse 1  MEWS RR 0  MEWS LOC 0  MEWS Score 1  MEWS Score Color Green  Assess: if the MEWS score is Yellow or Red  Were vital signs taken at a resting state? Yes  Focused Assessment No change from prior assessment  Does the patient meet 2 or more of the SIRS criteria? Yes  Does the patient have a confirmed or suspected source of infection? Yes  Treat  MEWS Interventions Administered scheduled meds/treatments;Administered prn meds/treatments  Pain Scale 0-10  Pain Score Asleep  Take Vital Signs  Increase Vital Sign Frequency  Yellow: Q 2hr X 2 then Q 4hr X 2, if remains yellow, continue Q 4hrs  Assess: SIRS CRITERIA  SIRS Temperature  0  SIRS Pulse 1  SIRS Respirations  0  SIRS WBC 0  SIRS Score Sum  1

## 2022-05-27 NOTE — Progress Notes (Signed)
  Progress Note   [name] SFK:[id number] DOB: 1945/06/28 DOA: 05/26/2022     1 DOS: the patient was seen and examined on 05/27/2022   Brief hospital course: JOHNATHON MITTAL is a 77 y.o. 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  She was found to have a left heel wound with infection.  Podiatry consult obtained, patient started antibiotics with Rocephin and vancomycin.  Assessment and Plan: Left foot infection:  Peripheral arterial disease. No evidence of sepsis.  Patient has been seen by podiatry, started on vancomycin, Flagyl and Rocephin MRI has been ordered, arterial ultrasound was also obtained.   Chronic combined systolic and diastolic heart failure ([place]): 2D echo on 07/24/2017 showed EF 25 to 30% with grade 2 diastolic dysfunction.  Patient does not have any short of breath, BNP 284, patient does not have exacerbation congestive heart failure.  Will continue to follow.   COPD (chronic obstructive pulmonary disease) [facility]): Stable    Coronary artery disease:  Followed, Plavix on hold due to scheduled surgery.   Essential hypertension On Coreg and lisinopril   Depression with anxiety -Continue home medications   Tobacco abuse -nicotine patch Advised to quit.      Subjective:  Patient still has some pain in the left heel.  No fever or chills.  No nausea vomiting.  Physical Exam: Vitals:   05/26/22 2037 05/27/22 0333 05/27/22 0848 05/27/22 1135  BP: (!) 153/97 (!) 150/78 137/70 119/68  Pulse: (!) 105 (!) 108 (!) 115 (!) 107  Resp: '20 20  16  '$ Temp: 98.2 F (36.8 C) 98.1 F (36.7 C) 98 F (36.7 C) 97.7 F (36.5 C)  TempSrc:  Oral  Oral  SpO2: 91% 90% 92% 91%  Weight:      Height:       General exam: Appears calm and comfortable  Respiratory  system: Clear to auscultation. Respiratory effort normal. Cardiovascular system: S1 & S2 heard, RRR. No JVD, murmurs, rubs, gallops or clicks.  Gastrointestinal system: Abdomen is nondistended, soft and nontender. No organomegaly or masses felt. Normal bowel sounds heard. Central nervous system: Alert and oriented. No focal neurological deficits. Extremities: Left heel wound, mild bilateral leg edema. Skin: No rashes, lesions or ulcers Psychiatry: Judgement and insight appear normal. Mood & affect appropriate.   Data Reviewed:  Reviewed x-ray results, reviewed lab results.  Family Communication:   Disposition: Status is: Inpatient Remains inpatient appropriate because: Severity of disease, IV antibiotics, pending procedure.  Planned Discharge Destination: Home with Home Health    Time spent: 35 minutes  Author: Sharen Hones, MD 05/27/2022 12:07 PM  For on call review [email or web address].

## 2022-05-27 NOTE — Consult Note (Signed)
WOC Nurse Consult Note: Reason for Consult: Left foot wounds, including Unstageable pressure injury to left heel. Wounds to site of transmetatarsal head amputation. WOC Nursing is simultaneously consulted with Podiatric medicine and Dr. Posey Pronto has seen this morning. Wound type:Neuropathic, PAD, pressure Pressure Injury POA: Yes Measurement:To be obtained by Bedside RN and documented on Nursing Flow Sheet with next dressing change Wound bed:See photo provided by EDP Drainage (amount, consistency, odor) small serous Periwound: intact Dressing procedure/placement/frequency: Dr. Posey Pronto has recommended betadine dressings to the wounds. I will transcribe those recommendations into Nursing orders. I have also provided guidance for Nursing to turn and reposition patient to minimize time in the supine position, for placement of a silicone sacral dressing for PI prevention and for bilateral Prevalon pressure redistribution heel boots for both treatment and prevention of PI.  WOC nursing team will not follow, but will remain available to this patient, the nursing and medical teams.  Please re-consult if needed.  Thank you for inviting [place] to participate in this patient's Plan of Care.  Maudie Flakes, MSN, RN, CNS, [place], Serita Grammes, [employer], [employer] phone:  [phone]

## 2022-05-27 NOTE — Hospital Course (Signed)
Michele Matthews is a 77 y.o. 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  She was found to have a left heel wound with infection.  Podiatry consult obtained, patient started antibiotics with Rocephin and vancomycin. PAD, seen by vascular surgery, scheduled surgery today.

## 2022-05-27 NOTE — Consult Note (Signed)
  Subjective:  Patient ID: Michele Matthews, female    DOB: Mar 05, 1946,  MRN: [id number]  A 77 y.o. 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  Patient states that she is getting pain for last 2 weeks has progressive gotten worse.  She wanted to get it evaluated she does not want to lose her leg.  She would like to discuss treatment options.  She is known to vascular who had an angiography done over a year ago.  She will benefit from further vascular testing  Objective:   Vitals:   05/26/22 2037 05/27/22 0333  BP: (!) 153/97 (!) 150/78  Pulse: (!) 105 (!) 108  Resp: 20 20  Temp: 98.2 F (36.8 C) 98.1 F (36.7 C)  SpO2: 91% 90%   General AA&O x3. Normal mood and affect.  Vascular Dorsalis pedis and posterior tibial pulses nonpalpable bilaterally Brisk capillary refill to all remaining digits. Pedal hair not present.  Neurologic Epicritic sensation grossly intact.  Dermatologic Left TMA superficial wound is dry and stable.  No probing down to deep tissue.  Left heel ulceration ulcer dry stable no signs of infection noted no redness noted no purulent drainage noted.  Orthopedic: MMT 5/5 in dorsiflexion, plantarflexion, inversion, and eversion. Normal joint ROM without pain or crepitus.       Assessment & Plan:  Patient was evaluated and treated and all questions answered.  Left multiple foot wounds stable with fat layer exposed -All questions and concerns were discussed with the patient in extensive detail. -Clinically I am not able able to appreciate the wounds probing down to deep tissue. -If she is able to tolerate she will benefit from an MRI if unable to do so x-rays are negative for osteomyelitis. -No surgical plans are indicated at this time. -She  will benefit from vascular workup with ABIs.  If there is worsening of flow she will benefit from vascular consult due to the soft tissue loss -Betadine wet-to-dry dressing daily -Podiatry to sign off please reconsult [place] as needed -Patient can follow-up at the wound care center 1 week from discharge   Felipa Furnace, DPM  Accessible via secure chat for questions or concerns.

## 2022-05-27 NOTE — Progress Notes (Signed)
   05/27/22 0848  Treat  MEWS Interventions Administered scheduled meds/treatments;Administered prn meds/treatments  Pain Scale 0-10  Pain Score 8  Pain Type Acute pain  Pain Location Foot  Patients Stated Pain Goal 0  Pain Intervention(s) Medication (See eMAR)

## 2022-05-27 NOTE — Progress Notes (Signed)
Per MRI tech, pt was not able to tolerate laying still for MRI for her foot. Pain and anxiety were given prior to transport but were not effective.

## 2022-05-27 NOTE — Plan of Care (Signed)

## 2022-05-27 NOTE — TOC Initial Note (Signed)
Transition of Care [facility]) - Initial/Assessment Note    Patient Details  Name: Michele Matthews MRN: [id number] Date of Birth: 09-20-1945  Transition of Care [facility]) CM/SW Contact:    Laurena Slimmer, RN Phone Number: 05/27/2022, 10:08 AM  Clinical Narrative:                  Transition of Care [facility]) Screening Note   Patient Details  Name: Michele Matthews Date of Birth: Oct 05, 1945   Transition of Care [facility]) CM/SW Contact:    Laurena Slimmer, RN Phone Number: 05/27/2022, 10:08 AM    Transition of Care Department (TOC) has reviewed patient and no TOC needs have been identified at this time. We will continue to monitor patient advancement through interdisciplinary progression rounds. If new patient transition needs arise, please place a TOC consult.           Patient Goals and CMS Choice            Expected Discharge Plan and Services                                              Prior Living Arrangements/Services                       Activities of Daily Living Home Assistive Devices/Equipment: Gilford Rile (specify type) ADL Screening (condition at time of admission) Patient's cognitive ability adequate to safely complete daily activities?: Yes Is the patient deaf or have difficulty hearing?: No Does the patient have difficulty seeing, even when wearing glasses/contacts?: No Does the patient have difficulty concentrating, remembering, or making decisions?: No Patient able to express need for assistance with ADLs?: Yes Does the patient have difficulty dressing or bathing?: No Independently performs ADLs?: Yes (appropriate for developmental age) Does the patient have difficulty walking or climbing stairs?: Yes Weakness of Legs: Both (wound on L heel) Weakness of Arms/Hands: None  Permission Sought/Granted                  Emotional Assessment              Admission diagnosis:  Cellulitis of left foot [L03.116] Left foot infection  [L08.9] Patient Active Problem List   Diagnosis Date Noted   PAD (peripheral artery disease) ([place]) 05/26/2022   HLD (hyperlipidemia) 05/26/2022   Depression with anxiety 05/26/2022   Leg edema, right 05/26/2022   UTI (urinary tract infection) 09/05/2021   Chronic combined systolic and diastolic heart failure ([place]) 09/05/2021   Hypokalemia 11/28/2020   Hyponatremia 11/28/2020   Left foot infection 11/25/2020   Chronic combined systolic and diastolic CHF (congestive heart failure) ([place]) 07/24/2017   Severe protein-calorie malnutrition ([place]) 04/01/2016   Anemia, iron deficiency    Pressure ulcer 12/27/2015   Essential hypertension 12/27/2015   Anemia 10/26/2015   COPD (chronic obstructive pulmonary disease) ([place]) 06/19/2015   Coronary artery disease 06/19/2015   GERD (gastroesophageal reflux disease) 06/19/2015   Hyperlipidemia, unspecified 06/19/2015   Long term current use of anticoagulant therapy 06/19/2015   Anxiety and depression 06/19/2015   Symptomatic anemia 12/10/2014   Upper GI bleed    History of CVA with residual deficit 12/10/2013   Hemianopsia 12/10/2013   Left-sided weakness 12/10/2013   Tobacco abuse 12/10/2013   PCP:  Dion Body, MD Pharmacy:   [place], [place] - [phone]  [place] [address] [place] [address] Phone: [phone] Fax: [phone]  [facility] [place], [place] [address] Phone: [phone] Fax: [phone]  [place], [place] - [address] [place] [address] Phone: [phone] Fax: [phone]     Social Determinants of Health (SDOH) Social History: SDOH Screenings   Food Insecurity: No Food Insecurity (05/26/2022)  Housing: Low Risk  (05/26/2022)  Transportation Needs: No Transportation Needs (05/26/2022)  Utilities: Not At Risk (05/26/2022)  Financial  Resource Strain: Medium Risk (11/08/2021)  Tobacco Use: High Risk (05/26/2022)   SDOH Interventions:     Readmission Risk Interventions     No data to display

## 2022-05-28 ENCOUNTER — Inpatient Hospital Stay: Payer: Medicare HMO

## 2022-05-28 DIAGNOSIS — I739 Peripheral vascular disease, unspecified: Secondary | ICD-10-CM | POA: Diagnosis not present

## 2022-05-28 DIAGNOSIS — I5042 Chronic combined systolic (congestive) and diastolic (congestive) heart failure: Secondary | ICD-10-CM | POA: Diagnosis not present

## 2022-05-28 DIAGNOSIS — L089 Local infection of the skin and subcutaneous tissue, unspecified: Secondary | ICD-10-CM | POA: Diagnosis not present

## 2022-05-28 MED ORDER — OXYCODONE HCL 5 MG PO TABS
10.0000 mg | ORAL_TABLET | Freq: Once | ORAL | Status: AC
  Administered 2022-05-29: 10 mg via ORAL
  Filled 2022-05-28: qty 2

## 2022-05-28 NOTE — Progress Notes (Signed)
  Progress Note   [name] DOB: 1946-04-11 DOA: 05/26/2022     2 DOS: the patient was seen and examined on 05/28/2022   Brief hospital course: Michele Matthews is a 77 y.o. 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  She was found to have a left heel wound with infection.  Podiatry consult obtained, patient started antibiotics with Rocephin and vancomycin.  Assessment and Plan:  Left foot infection:  Peripheral arterial disease. No evidence of sepsis.  Patient has been seen by podiatry, started on vancomycin, Flagyl and Rocephin. Patient was not able to tolerate MRI even give her dose of Ativan.  Currently pending ABI test to determine the next course of action.  Given high dose of oxycodone prior to the ABI testing as patient has unable to tolerate. Continue current antibiotics for now.   Chronic combined systolic and diastolic heart failure ([place]): 2D echo on 07/24/2017 showed EF 25 to 30% with grade 2 diastolic dysfunction.  Patient does not have any short of breath, BNP 284, patient does not have exacerbation congestive heart failure.   No appear euvolemic.   COPD (chronic obstructive pulmonary disease) [facility]): Stable     Coronary artery disease:  Followed, Plavix on hold due to scheduled surgery.   Essential hypertension On Coreg and lisinopril   Depression with anxiety -Continue home medications   Tobacco abuse -nicotine patch Advised to quit.      Subjective:  Patient has been complaining of severe pain in the left heel, receiving pain medicine.  Otherwise she denies any short of breath or cough.  Physical Exam: Vitals:   05/27/22 1535 05/27/22 1908 05/28/22 0421 05/28/22 0800  BP: (!) 164/79 (!) 161/67 (!) 166/89 (!) 164/86  Pulse:  (!) 102 (!) 108 (!) 106 (!) 104  Resp: '20 20 16 16  '$ Temp: 98 F (36.7 C) 98.2 F (36.8 C) (!) 97.5 F (36.4 C) 98 F (36.7 C)  TempSrc:  Oral Oral Oral  SpO2: 92% 92% 100% 100%  Weight:   58.1 kg   Height:       General exam: Appears calm and comfortable  Respiratory system: Clear to auscultation. Respiratory effort normal. Cardiovascular system: S1 & S2 heard, RRR. No JVD, murmurs, rubs, gallops or clicks. No pedal edema. Gastrointestinal system: Abdomen is nondistended, soft and nontender. No organomegaly or masses felt. Normal bowel sounds heard. Central nervous system: Alert and oriented. No focal neurological deficits. Extremities: Right heel wound. Skin: No rashes, lesions or ulcers Psychiatry: Judgement and insight appear normal. Mood & affect appropriate.   Data Reviewed:  Lab results reviewed.  Family Communication: husband updated  Disposition: Status is: Inpatient Remains inpatient appropriate because: Severity of disease, IV treatment.  Planned Discharge Destination: Home with Home Health    Time spent: 35 minutes  Author: Sharen Hones, MD 05/28/2022 1:59 PM  For on call review [email or web address].

## 2022-05-28 NOTE — Progress Notes (Signed)
Pt states I am not changing her dressing at this time. It can be done later today. Rescheduled to 10am.

## 2022-05-29 ENCOUNTER — Inpatient Hospital Stay: Payer: Medicare HMO

## 2022-05-29 DIAGNOSIS — I739 Peripheral vascular disease, unspecified: Secondary | ICD-10-CM | POA: Diagnosis not present

## 2022-05-29 DIAGNOSIS — I69354 Hemiplegia and hemiparesis following cerebral infarction affecting left non-dominant side: Secondary | ICD-10-CM

## 2022-05-29 DIAGNOSIS — I5042 Chronic combined systolic (congestive) and diastolic (congestive) heart failure: Secondary | ICD-10-CM | POA: Diagnosis not present

## 2022-05-29 DIAGNOSIS — L089 Local infection of the skin and subcutaneous tissue, unspecified: Secondary | ICD-10-CM | POA: Diagnosis not present

## 2022-05-29 LAB — CREATININE, SERUM
Creatinine, Ser: 0.64 mg/dL (ref 0.44–1.00)
GFR, Estimated: >60 mL/min (ref >60)

## 2022-05-29 NOTE — Progress Notes (Signed)
  Progress Note   [name] EXB:[id number] DOB: 09-Dec-1945 DOA: 05/26/2022     3 DOS: the patient was seen and examined on 05/29/2022   Brief hospital course: FAYETTE GASNER is a 77 y.o. 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  She was found to have a left heel wound with infection.  Podiatry consult obtained, patient started antibiotics with Rocephin and vancomycin.  Assessment and Plan: Left foot infection:  Peripheral arterial disease. No evidence of sepsis.  Patient has been seen by podiatry, started on vancomycin, Flagyl and Rocephin. Patient was not able to tolerate MRI even give her dose of Ativan.  Currently pending ABI test to determine the next course of action.  Given high dose of oxycodone prior to the ABI testing as patient has unable to tolerate. Patient condition appears to be improving, the wound still looks the same, but less odorous.  Continue current coverage.   Chronic combined systolic and diastolic heart failure ([place]): 2D echo on 07/24/2017 showed EF 25 to 30% with grade 2 diastolic dysfunction.  Patient does not have any short of breath, BNP 284, patient does not have exacerbation congestive heart failure.   No appear euvolemic.   COPD (chronic obstructive pulmonary disease) ([address]): Stable  History of stroke with left hemiparesis. Continue home medicines.     Coronary artery disease:  Followed, Plavix on hold due to scheduled surgery.   Essential hypertension On Coreg and lisinopril   Depression with anxiety -Continue home medications   Tobacco abuse -nicotine patch Advised to quit.       Subjective:  Left heel pain is better controlled.  She does not have any short of breath.  Physical Exam: Vitals:   05/28/22 1917 05/29/22  0455 05/29/22 0532 05/29/22 0751  BP: (!) 164/63 (!) 161/75 (!) 149/62 (!) 151/64  Pulse: 97 (!) 102  96  Resp: '16 16  18  '$ Temp: 98 F (36.7 C) 98.9 F (37.2 C)  98.2 F (36.8 C)  TempSrc: Oral Oral  Oral  SpO2: 90% 92%  94%  Weight:  58.8 kg    Height:       General exam: Appears calm and comfortable  Respiratory system: Clear to auscultation. Respiratory effort normal. Cardiovascular system: S1 & S2 heard, RRR. No JVD, murmurs, rubs, gallops or clicks. No pedal edema. Gastrointestinal system: Abdomen is nondistended, soft and nontender. No organomegaly or masses felt. Normal bowel sounds heard. Central nervous system: Alert and oriented.  Left-sided weakness. Extremities: Left heel wound. Skin: No rashes, lesions or ulcers Psychiatry: Judgement and insight appear normal. Mood & affect appropriate.   Data Reviewed:  Lab results reviewed again.  Family Communication: None  Disposition: Status is: Inpatient Remains inpatient appropriate because: Severity of disease, IV treatment  Planned Discharge Destination:  TBD    Time spent: 35 minutes  Author: Sharen Hones, MD 05/29/2022 1:01 PM  For on call review [email or web address].

## 2022-05-30 DIAGNOSIS — I69354 Hemiplegia and hemiparesis following cerebral infarction affecting left non-dominant side: Secondary | ICD-10-CM

## 2022-05-30 DIAGNOSIS — L089 Local infection of the skin and subcutaneous tissue, unspecified: Secondary | ICD-10-CM | POA: Diagnosis not present

## 2022-05-30 DIAGNOSIS — I5042 Chronic combined systolic (congestive) and diastolic (congestive) heart failure: Secondary | ICD-10-CM | POA: Diagnosis not present

## 2022-05-30 LAB — CREATININE, SERUM
Creatinine, Ser: 0.8 mg/dL (ref 0.44–1.00)
GFR, Estimated: >60 mL/min (ref >60)

## 2022-05-30 MED ORDER — SODIUM CHLORIDE 0.9 % IV SOLN: INTRAVENOUS | Status: DC

## 2022-05-30 MED ORDER — CHLORHEXIDINE GLUCONATE CLOTH 2 % EX PADS: 6.0000 | MEDICATED_PAD | Freq: Once | CUTANEOUS | Status: DC

## 2022-05-30 MED ORDER — CHLORHEXIDINE GLUCONATE CLOTH 2 % EX PADS
6.0000 | MEDICATED_PAD | Freq: Once | CUTANEOUS | Status: AC
  Administered 2022-05-30: 6 via TOPICAL

## 2022-05-30 NOTE — Consult Note (Signed)
WOC Nurse Consult Note: Reconsulted for left heel/foot. Patient was seen by podiatric medicine on 1/26 and made recommendations for topical care. Patient is to follow in their office post discharge. Topical care guidance for nursing is current and active.  Discussed with Dr. Roosevelt Locks via secure Chat.  [place] nursing team will not follow, but will remain available to this patient, the nursing and medical teams.  Please re-consult if needed.  Thank you for inviting [place] to participate in this patient's Plan of Care.  Maudie Flakes, MSN, RN, CNS, [place], [employer] Grammes, [employer], [employer] phone:  [phone]

## 2022-05-30 NOTE — Progress Notes (Signed)
  Progress Note   [name] DOB: 06-13-1945 DOA: 05/26/2022     4 DOS: the patient was seen and examined on 05/30/2022   Brief hospital course: Michele Matthews is a 77 y.o. 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  She was found to have a left heel wound with infection.  Podiatry consult obtained, patient started antibiotics with Rocephin and vancomycin.  Assessment and Plan: Left foot infection:  Peripheral arterial disease. No evidence of sepsis.  Patient has been seen by podiatry, started on vancomycin, Flagyl and Rocephin. Patient was not able to tolerate MRI even give her dose of Ativan.  Currently pending ABI test to determine the next course of action.  Given high dose of oxycodone prior to the ABI testing as patient has unable to tolerate. Discussed with Dr. Posey Pronto, patient does not need debridement.  Recommend antibiotic treatment and follow-up with wound care. Also patient will be seen by vascular surgery. Patient probably can be discharged home tomorrow if no surgery is needed.   Chronic combined systolic and diastolic heart failure ([place]): 2D echo on 07/24/2017 showed EF 25 to 30% with grade 2 diastolic dysfunction.  Patient does not have any short of breath, BNP 284, patient does not have exacerbation congestive heart failure.   No appear euvolemic.   COPD (chronic obstructive pulmonary disease) ([place]): Stable   History of stroke with left hemiparesis. Continue home medicines.     Coronary artery disease:  Followed, Plavix on hold due to scheduled surgery.   Essential hypertension On Coreg and lisinopril   Depression with anxiety -Continue home medications   Tobacco abuse -nicotine patch Advised to quit.      Subjective: Patient  left heel pain seem to be better.    Physical Exam: Vitals:   05/29/22 0751 05/29/22 1611 05/29/22 1900 05/30/22 0512  BP: (!) 151/64 (!) 160/65 (!) 137/58 (!) 150/65  Pulse: 96 99 97 92  Resp: '18 18 18 18  '$ Temp: 98.2 F (36.8 C) 99.6 F (37.6 C) 98.4 F (36.9 C) 98.1 F (36.7 C)  TempSrc: Oral Oral Oral Oral  SpO2: 94% 94% 90% 95%  Weight:      Height:       General exam: Appears calm and comfortable  Respiratory system: Clear to auscultation. Respiratory effort normal. Cardiovascular system: S1 & S2 heard, RRR. No JVD, murmurs, rubs, gallops or clicks. No pedal edema. Gastrointestinal system: Abdomen is nondistended, soft and nontender. No organomegaly or masses felt. Normal bowel sounds heard. Central nervous system: Alert and oriented x3. No focal neurological deficits. Extremities:left heel wound Skin: No rashes, lesions or ulcers Psychiatry: Judgement and insight appear normal. Mood & affect appropriate.   Data Reviewed:  Reviewed ultrasound results.  Family Communication: Husband updated over the phone.  Disposition: Status is: Inpatient Remains inpatient appropriate because: Disease severity, IV treatment.  Planned Discharge Destination: Home with Home Health    Time spent: 35 minutes  Author: Sharen Hones, MD 05/30/2022 11:08 AM  For on call review [email or web address].

## 2022-05-30 NOTE — H&P (View-Only) (Signed)
Hospital Consult    Reason for Consult:  Left Heel Ulcer  Requesting Physician:  Dr Sharen Hones MD  MRN #:  [id number]  History of Present Illness: This is a 77 y.o. 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  She was found to have a left heel wound with infection.  Podiatry consult obtained, patient started antibiotics with Rocephin and vancomycin.   On exam today patient complains of 10/10 pain to left lower extremity. I removed the dressing to her foot. Heel ulceration has been photographed and placed in the media files. She has no other complaints and vitals all remain stable.   Past Medical History:  Diagnosis Date   Allergy    COPD (chronic obstructive pulmonary disease) ([place])    Coronary artery disease    Depression    Emphysema of lung (H[facility])    GERD (gastroesophageal reflux disease)    Hyperlipidemia    Hypertension    Seizures ([place])    Stroke [facility])     Past Surgical History:  Procedure Laterality Date   COLONOSCOPY WITH PROPOFOL N/A 09/08/2021   Procedure: COLONOSCOPY WITH PROPOFOL;  Surgeon: Toledo, Benay Pike, MD;  Location: [facility] ENDOSCOPY;  Service: Gastroenterology;  Laterality: N/A;   ESOPHAGOGASTRODUODENOSCOPY N/A 08/10/2015   Procedure: ESOPHAGOGASTRODUODENOSCOPY (EGD);  Surgeon: Manya Silvas, MD;  Location: [facility] ENDOSCOPY;  Service: Endoscopy;  Laterality: N/A;   ESOPHAGOGASTRODUODENOSCOPY N/A 09/08/2021   Procedure: ESOPHAGOGASTRODUODENOSCOPY (EGD);  Surgeon: Toledo, Benay Pike, MD;  Location: [facility] ENDOSCOPY;  Service: Gastroenterology;  Laterality: N/A;   ESOPHAGOGASTRODUODENOSCOPY (EGD) WITH PROPOFOL N/A 12/11/2014   Procedure: ESOPHAGOGASTRODUODENOSCOPY (EGD) WITH PROPOFOL;  Surgeon: Lucilla Lame, MD;  Location: [facility] ENDOSCOPY;  Service: Endoscopy;   Laterality: N/A;   ESOPHAGOGASTRODUODENOSCOPY (EGD) WITH PROPOFOL N/A 12/29/2015   Procedure: ESOPHAGOGASTRODUODENOSCOPY (EGD) WITH PROPOFOL;  Surgeon: Mauri Pole, MD;  Location: [facility] ENDOSCOPY;  Service: Endoscopy;  Laterality: N/A;   LOWER EXTREMITY ANGIOGRAPHY Left 11/25/2020   Procedure: Lower Extremity Angiography;  Surgeon: Algernon Huxley, MD;  Location: [place] CV LAB;  Service: Cardiovascular;  Laterality: Left;   LOWER EXTREMITY ANGIOGRAPHY Left 11/30/2020   Procedure: Lower Extremity Angiography;  Surgeon: Algernon Huxley, MD;  Location: [place] CV LAB;  Service: Cardiovascular;  Laterality: Left;   TRANSMETATARSAL AMPUTATION Left 12/02/2020   Procedure: TRANSMETATARSAL AMPUTATION;  Surgeon: Caroline More, DPM;  Location: [facility] ORS;  Service: Podiatry;  Laterality: Left;    No Known Allergies  Prior to Admission medications   Medication Sig Start Date End Date Taking? Authorizing Provider  acetaminophen (TYLENOL) 500 MG tablet Take 2 tablets (1,000 mg total) by mouth 3 (three) times daily. 12/08/20  Yes Danford, Suann Larry, MD  atorvastatin (LIPITOR) 20 MG tablet Take 20 mg by mouth daily. 08/26/21  Yes [provider]  busPIRone (BUSPAR) 7.5 MG tablet Take 1 tablet (7.5 mg total) by mouth 2 (two) times daily. 11/03/16  Yes Karamalegos, Devonne Doughty, DO  carvedilol (COREG) 3.125 MG tablet Take 1 tablet (3.125 mg total) by mouth 2 (two) times daily with a meal. 07/27/17  Yes Gladstone Lighter, MD  clopidogrel (PLAVIX) 75 MG tablet Take 75 mg by mouth daily. 11/16/20  Yes [provider]  diphenhydramine-acetaminophen (TYLENOL PM) 25-500 MG TABS tablet Take 1 tablet by mouth at bedtime as needed.   Yes [provider]  furosemide (LASIX) 20 MG tablet Take 1 tablet (20 mg total) by mouth daily. 07/27/17  Yes Gladstone Lighter, MD  hydrOXYzine (ATARAX/VISTARIL) 10 MG tablet Take 1 tablet (10 mg total) by mouth 2 (two) times daily as needed for anxiety (sleep).  11/03/16  Yes Karamalegos, Devonne Doughty, DO  lisinopril (ZESTRIL) 5 MG tablet Take 5 mg by mouth daily. 10/08/20  Yes [provider]  Multiple Vitamin (MULTIVITAMIN) tablet Take 1 tablet by mouth daily.   Yes [provider]  pantoprazole (PROTONIX) 40 MG tablet TAKE 1 TABLET EVERY DAY 03/31/17  Yes Mikey College, NP  senna-docusate (SENOKOT-S) 8.6-50 MG tablet Take 2 tablets by mouth 2 (two) times daily. 12/08/20  Yes Danford, Suann Larry, MD  sertraline (ZOLOFT) 100 MG tablet Take 1 tablet (100 mg total) by mouth 2 (two) times daily. 07/15/16  Yes Karamalegos, Devonne Doughty, DO  Tetrahydrozoline HCl (VISINE OP) Place 1 drop into both eyes 2 (two) times daily as needed (dry eyes).   Yes [provider]  ascorbic acid (VITAMIN C) 250 MG tablet Take 1 tablet (250 mg total) by mouth 2 (two) times daily. Patient not taking: Reported on 05/26/2022 12/08/20   Edwin Dada, MD  feeding supplement, ENSURE ENLIVE, (ENSURE ENLIVE) LIQD Take 237 mLs by mouth 3 (three) times daily between meals. 07/27/17   Gladstone Lighter, MD  ferrous sulfate 325 (65 FE) MG tablet Take 1 tablet (325 mg total) by mouth 2 (two) times daily with a meal. Patient not taking: Reported on 05/26/2022 09/09/21   Fritzi Mandes, MD  nicotine (NICODERM CQ - DOSED IN MG/24 HOURS) 21 mg/24hr patch Place 1 patch (21 mg total) onto the skin daily. Patient not taking: Reported on 05/26/2022 12/09/20   Edwin Dada, MD  oxyCODONE (OXY IR/ROXICODONE) 5 MG immediate release tablet Take 1 tablet (5 mg total) by mouth every 4 (four) hours as needed for moderate pain. Patient not taking: Reported on 05/26/2022 12/08/20   Edwin Dada, MD    Social History   Socioeconomic History   Marital status: Married    Spouse name: Not on file   Number of children: Not on file   Years of education: Not on file   Highest education level: Not on file  Occupational History   Occupation: retired  Tobacco  Use   Smoking status: Every Day    Packs/day: 1.00    Types: Cigarettes   Smokeless tobacco: Current  Vaping Use   Vaping Use: Never used  Substance and Sexual Activity   Alcohol use: No   Drug use: No   Sexual activity: Not Currently  Other Topics Concern   Not on file  Social History Narrative   Not on file   Social Determinants of Health   Financial Resource Strain: Medium Risk (11/08/2021)   Overall Financial Resource Strain (CARDIA)    Difficulty of Paying Living Expenses: Somewhat hard  Food Insecurity: No Food Insecurity (05/26/2022)   Hunger Vital Sign    Worried About Running Out of Food in the Last Year: Never true    [place] in the Last Year: Never true  Transportation Needs: No Transportation Needs (05/26/2022)   PRAPARE - Hydrologist (Medical): No    Lack of Transportation (Non-Medical): No  Physical Activity: Not on file  Stress: Not on file  Social Connections: Not on file  Intimate Partner Violence: Not At Risk (05/26/2022)   Humiliation, Afraid, Rape,  and Kick questionnaire    Fear of Current or Ex-Partner: No    Emotionally Abused: No    Physically Abused: No    Sexually Abused: No     Family History  Problem Relation Age of Onset   Alzheimer's disease Father    Lung cancer Sister    Breast cancer Sister    Cancer Mother     ROS: Otherwise negative unless mentioned in HPI  Physical Examination  Vitals:   05/29/22 1900 05/30/22 0512  BP: (!) 137/58 (!) 150/65  Pulse: 97 92  Resp: 18 18  Temp: 98.4 F (36.9 C) 98.1 F (36.7 C)  SpO2: 90% 95%   Body mass index is 21.57 kg/m.  General:  WDWN in NAD Gait: Not observed HENT: WNL, normocephalic Pulmonary: normal non-labored breathing, without Rales, rhonchi,  wheezing Cardiac: regular, without  Murmurs, rubs or gallops; without carotid bruits Abdomen: Positive Bowel sounds, soft, NT/ND, no masses Skin: without rashes Vascular Exam/Pulses: Non Palpable  bilateral lower extremity pulses. Cool to touch.   Extremities: with ischemic changes, without Gangrene , without cellulitis; with open wounds;  Musculoskeletal: no muscle wasting or atrophy  Neurologic: A&O X 3;  No focal weakness or paresthesias are detected; speech is fluent/normal Psychiatric:  The pt has Abnormal- Slow speech post HX CVA  affect. Lymph:  Unremarkable  CBC    Component Value Date/Time   WBC 12.9 (H) 05/27/2022 0416   RBC 4.28 05/27/2022 0416   HGB 11.9 (L) 05/27/2022 0416   HGB 12.1 03/27/2014 1730   HCT 38.1 05/27/2022 0416   HCT 37.0 03/27/2014 1730   PLT 270 05/27/2022 0416   PLT 175 03/27/2014 1730   MCV 89.0 05/27/2022 0416   MCV 97 03/27/2014 1730   MCH 27.8 05/27/2022 0416   MCHC 31.2 05/27/2022 0416   RDW 14.6 05/27/2022 0416   RDW 13.3 03/27/2014 1730   LYMPHSABS 1.2 05/26/2022 1354   LYMPHSABS 1.9 11/25/2013 0401   MONOABS 0.6 05/26/2022 1354   MONOABS 0.7 11/25/2013 0401   EOSABS 0.2 05/26/2022 1354   EOSABS 0.2 11/25/2013 0401   BASOSABS 0.0 05/26/2022 1354   BASOSABS 0.0 11/25/2013 0401    BMET    Component Value Date/Time   NA 137 05/27/2022 0416   NA 140 03/28/2014 0441   K 3.9 05/27/2022 0416   K 4.0 03/28/2014 0441   CL 102 05/27/2022 0416   CL 110 (H) 03/28/2014 0441   CO2 28 05/27/2022 0416   CO2 24 03/28/2014 0441   GLUCOSE 118 (H) 05/27/2022 0416   GLUCOSE 86 03/28/2014 0441   BUN 17 05/27/2022 0416   BUN 19 (H) 03/28/2014 0441   CREATININE 0.80 05/30/2022 0341   CREATININE 0.79 11/24/2015 0908   CALCIUM 9.7 05/27/2022 0416   CALCIUM 8.7 03/28/2014 0441   GFRNONAA >60 05/30/2022 0341   GFRNONAA 76 11/24/2015 0908   GFRAA >60 07/28/2017 0507   GFRAA 87 11/24/2015 0908    COAGS: Lab Results  Component Value Date   INR 1.0 05/26/2022   INR 1.2 09/05/2021   INR 1.0 11/25/2020     Non-Invasive Vascular Imaging:   EXAM: NONINVASIVE PHYSIOLOGIC VASCULAR STUDY OF BILATERAL LOWER EXTREMITIES  FINDINGS: Right  ABI:  0.41  Left ABI:  Not calculated   Right Lower Extremity: Abnormal blunted monophasic arterial waveforms.  < 0.5 Severe PAD   IMPRESSION: Severe right lower extremity peripheral arterial disease with a resting ankle-brachial index of 0.41.  EXAM: RIGHT LOWER EXTREMITY VENOUS DOPPLER  ULTRASOUND  IMPRESSION: Negative. NO DVT   Statin:  Yes.   Beta Blocker:  Yes.   Aspirin:  Yes.   ACEI:  Yes.   ARB:  No. CCB use:  No Other antiplatelets/anticoagulants:  Yes.   Plavix 75 mg   ASSESSMENT/PLAN: This is a 77 y.o. 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  She was found to have a left heel wound with infection. ABI's were obtained and only right lower extremity ABI's were able to be completed due to extreme pain in the left lower extremity. Her right ABI was calculated at 0.41 with abnormal monophasic pulses showing severe right lower extremity PAD.  However her heel ulceration and prior vascular ischemia is on the left.   Vascular Surgery recommends at this time a diagnostic angiogram of her bilateral lower extremities. She may need possible amputation if revascularization is not an option.  She may also need possible angioplasty and possible stent placement to both her lower extremities for revascularization. We would like to start with her left lower extremity with open ulceration. We can complete the right side after recovery from the left lower extremity intervention. Today her BUN = 17 and Creatinine = 0.80  I discussed in detail with her the procedure, benefits, risks and complications. She wishes to proceed with an angiogram of her left lower extremity with possible angioplasty and possible stent placement to help relieve her 10/10 pain. I did discuss possible  amputation and she does not wish to have this done. I will make her npo at midnight for procedure tomorrow.      Drema Pry [facility] 05/30/2022 12:00 PM

## 2022-05-30 NOTE — Care Management Important Message (Signed)
Important Message  Patient Details  Name: Michele Matthews MRN: [id number] Date of Birth: March 13, 1946   Medicare Important Message Given:  Yes  Patient asleep upon time of visit.  Copy of Medicare IM left in room on counter to reference.   Dannette Barbara 05/30/2022, 11:12 AM

## 2022-05-30 NOTE — Evaluation (Signed)
Occupational Therapy Evaluation Patient Details Name: Michele Matthews MRN: [id number] DOB: 1945/08/27 Today's Date: 05/30/2022   History of Present Illness Michele Matthews is a 77 y.o. 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  She was found to have a left heel wound with infection. Vascular consulted with plan for angiogram, at risk for amputation.   Clinical Impression   Michele Matthews was seen for OT evaluation this date. Prior to hospital admission, pt was requiring assist for mobiilty, reports spouse pushed her in rollator. Pt presents to acute OT demonstrating impaired ADL performance and functional mobility 2/2 decreased activity tolerance and functional strength/ROM/balance deficits. Pt currently requires MAX A sup<>sit and rolling bed level. Intermittent MIN A static sitting improves to CGA, pt elects to maintain LLE NWBing and keeps L hip and knee flexed t/o session. Defers all OOB attempts at this time. Noted to have wet sheets, clean linen provided. Pt would benefit from skilled OT to address noted impairments and functional limitations (see below for any additional details). Upon hospital discharge, recommend STR, if family refuses STR then recommend [place] with 24/7 SUPERVISION, +2 for all mobility.     Recommendations for follow up therapy are one component of a multi-disciplinary discharge planning process, led by the attending physician.  Recommendations may be updated based on patient status, additional functional criteria and insurance authorization.   Follow Up Recommendations  Skilled nursing-short term rehab (<3 hours/day)     Assistance Recommended at Discharge Frequent or constant Supervision/Assistance  Patient can return home with the following Two people to help with  walking and/or transfers;Two people to help with bathing/dressing/bathroom;Help with stairs or ramp for entrance    Functional Status Assessment  Patient has had a recent decline in their functional status and demonstrates the ability to make significant improvements in function in a reasonable and predictable amount of time.  Equipment Recommendations  Hospital bed    Recommendations for Other Services       Precautions / Restrictions Precautions Precautions: Fall Restrictions Weight Bearing Restrictions: No      Mobility Bed Mobility Overal bed mobility: Needs Assistance Bed Mobility: Supine to Sit, Sit to Supine, Rolling Rolling: Max assist   Supine to sit: Max assist Sit to supine: Max assist        Transfers                   General transfer comment: pt refused standing attempts      Balance Overall balance assessment: Needs assistance Sitting-balance support: Single extremity supported (RLE supported only) Sitting balance-Leahy Scale: Fair                                     ADL either performed or assessed with clinical judgement   ADL Overall ADL's : Needs assistance/impaired                                       General ADL Comments: MAX A pericare bed level.      Pertinent Vitals/Pain Pain Assessment Pain Assessment: 0-10 Pain Score: 10-Worst pain ever Pain Location: L foot Pain Descriptors / Indicators: Discomfort, Grimacing  Pain Intervention(s): Limited activity within patient's tolerance, Premedicated before session, Repositioned     Hand Dominance Right   Extremity/Trunk Assessment Upper Extremity Assessment Upper Extremity Assessment: Overall WFL for tasks assessed   Lower Extremity Assessment Lower Extremity Assessment: Generalized weakness       Communication Communication Communication: HOH   Cognition Arousal/Alertness: Awake/alert Behavior During Therapy: WFL for tasks  assessed/performed Overall Cognitive Status: Within Functional Limits for tasks assessed                                                  Home Living Family/patient expects to be discharged to:: Private residence Living Arrangements: Spouse/significant other Available Help at Discharge: Family Type of Home: House Home Access: Stairs to enter Technical brewer of Steps: 3   Home Layout: One level     Bathroom Shower/Tub: Teacher, early years/pre: Standard Bathroom Accessibility: Yes   Home Equipment: Conservation officer, nature (2 wheels);Rollator (4 wheels);Tub bench;Wheelchair - manual;Cane - single point   Additional Comments: home setup per chart      Prior Functioning/Environment Prior Level of Function : Needs assist             Mobility Comments: reports husband unable to pick her up but he pushes her around house in her rollator          OT Problem List: Decreased strength;Decreased activity tolerance;Decreased range of motion;Impaired balance (sitting and/or standing);Decreased safety awareness;Pain      OT Treatment/Interventions: Self-care/ADL training;Therapeutic exercise;DME and/or AE instruction;Energy conservation;Therapeutic activities;Patient/family education;Balance training    OT Goals(Current goals can be found in the care plan section) Acute Rehab OT Goals Patient Stated Goal: to go home OT Goal Formulation: With patient Time For Goal Achievement: 06/13/22 Potential to Achieve Goals: Good ADL Goals Pt Will Perform Grooming: with modified independence;sitting Pt Will Perform Lower Body Dressing: with modified independence;sitting/lateral leans Pt Will Transfer to Toilet: with min guard assist;ambulating;bedside commode  OT Frequency: Min 2X/week    Co-evaluation              AM-PAC OT "6 Clicks" Daily Activity     Outcome Measure Help from another person eating meals?: None Help from another person taking care  of personal grooming?: A Little Help from another person toileting, which includes using toliet, bedpan, or urinal?: A Lot Help from another person bathing (including washing, rinsing, drying)?: A Lot Help from another person to put on and taking off regular upper body clothing?: A Little Help from another person to put on and taking off regular lower body clothing?: A Lot 6 Click Score: 16   End of Session Nurse Communication: Other (comment) (iv leaking)  Activity Tolerance: Patient limited by pain Patient left: in bed;with call bell/phone within reach;with bed alarm set  OT Visit Diagnosis: Other abnormalities of gait and mobility (R26.89);Muscle weakness (generalized) (M62.81)                Time: 5366-4403 OT Time Calculation (min): 23 min Charges:  OT General Charges $OT Visit: 1 Visit OT Evaluation $OT Eval Moderate Complexity: 1 Mod OT Treatments $Self Care/Home Management : 8-22 mins  Dessie Coma, M.S. OTR/L  05/30/22, 3:57 PM  ascom [phone]

## 2022-05-30 NOTE — Consult Note (Signed)
Hospital Consult    Reason for Consult:  Left Heel Ulcer  Requesting Physician:  Dr Sharen Hones MD  MRN #:  [id number]  History of Present Illness: This is a 77 y.o. 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  She was found to have a left heel wound with infection.  Podiatry consult obtained, patient started antibiotics with Rocephin and vancomycin.   On exam today patient complains of 10/10 pain to left lower extremity. I removed the dressing to her foot. Heel ulceration has been photographed and placed in the media files. She has no other complaints and vitals all remain stable.   Past Medical History:  Diagnosis Date   Allergy    COPD (chronic obstructive pulmonary disease) ([place])    Coronary artery disease    Depression    Emphysema of lung (HCC)    GERD (gastroesophageal reflux disease)    Hyperlipidemia    Hypertension    Seizures ([place])    Stroke [facility])     Past Surgical History:  Procedure Laterality Date   COLONOSCOPY WITH PROPOFOL N/A 09/08/2021   Procedure: COLONOSCOPY WITH PROPOFOL;  Surgeon: Toledo, Benay Pike, MD;  Location: [facility] ENDOSCOPY;  Service: Gastroenterology;  Laterality: N/A;   ESOPHAGOGASTRODUODENOSCOPY N/A 08/10/2015   Procedure: ESOPHAGOGASTRODUODENOSCOPY (EGD);  Surgeon: Manya Silvas, MD;  Location: [facility] ENDOSCOPY;  Service: Endoscopy;  Laterality: N/A;   ESOPHAGOGASTRODUODENOSCOPY N/A 09/08/2021   Procedure: ESOPHAGOGASTRODUODENOSCOPY (EGD);  Surgeon: Toledo, Benay Pike, MD;  Location: [facility] ENDOSCOPY;  Service: Gastroenterology;  Laterality: N/A;   ESOPHAGOGASTRODUODENOSCOPY (EGD) WITH PROPOFOL N/A 12/11/2014   Procedure: ESOPHAGOGASTRODUODENOSCOPY (EGD) WITH PROPOFOL;  Surgeon: Lucilla Lame, MD;  Location: [facility] ENDOSCOPY;  Service: Endoscopy;   Laterality: N/A;   ESOPHAGOGASTRODUODENOSCOPY (EGD) WITH PROPOFOL N/A 12/29/2015   Procedure: ESOPHAGOGASTRODUODENOSCOPY (EGD) WITH PROPOFOL;  Surgeon: Mauri Pole, MD;  Location: [facility] ENDOSCOPY;  Service: Endoscopy;  Laterality: N/A;   LOWER EXTREMITY ANGIOGRAPHY Left 11/25/2020   Procedure: Lower Extremity Angiography;  Surgeon: Algernon Huxley, MD;  Location: [place] CV LAB;  Service: Cardiovascular;  Laterality: Left;   LOWER EXTREMITY ANGIOGRAPHY Left 11/30/2020   Procedure: Lower Extremity Angiography;  Surgeon: Algernon Huxley, MD;  Location: [place] CV LAB;  Service: Cardiovascular;  Laterality: Left;   TRANSMETATARSAL AMPUTATION Left 12/02/2020   Procedure: TRANSMETATARSAL AMPUTATION;  Surgeon: Caroline More, DPM;  Location: [facility] ORS;  Service: Podiatry;  Laterality: Left;    No Known Allergies  Prior to Admission medications   Medication Sig Start Date End Date Taking? Authorizing Provider  acetaminophen (TYLENOL) 500 MG tablet Take 2 tablets (1,000 mg total) by mouth 3 (three) times daily. 12/08/20  Yes Danford, Suann Larry, MD  atorvastatin (LIPITOR) 20 MG tablet Take 20 mg by mouth daily. 08/26/21  Yes [provider]  busPIRone (BUSPAR) 7.5 MG tablet Take 1 tablet (7.5 mg total) by mouth 2 (two) times daily. 11/03/16  Yes Karamalegos, Devonne Doughty, DO  carvedilol (COREG) 3.125 MG tablet Take 1 tablet (3.125 mg total) by mouth 2 (two) times daily with a meal. 07/27/17  Yes Gladstone Lighter, MD  clopidogrel (PLAVIX) 75 MG tablet Take 75 mg by mouth daily. 11/16/20  Yes [provider]  diphenhydramine-acetaminophen (TYLENOL PM) 25-500 MG TABS tablet Take 1 tablet by mouth at bedtime as needed.   Yes [provider]  furosemide (LASIX) 20 MG tablet Take 1 tablet (20 mg total) by mouth daily. 07/27/17  Yes [place]stone Lighter, MD  hydrOXYzine (ATARAX/VISTARIL) 10 MG tablet Take 1 tablet (10 mg total) by mouth 2 (two) times daily as needed for anxiety (sleep).  11/03/16  Yes Karamalegos, Devonne Doughty, DO  lisinopril (ZESTRIL) 5 MG tablet Take 5 mg by mouth daily. 10/08/20  Yes [provider]  Multiple Vitamin (MULTIVITAMIN) tablet Take 1 tablet by mouth daily.   Yes [provider]  pantoprazole (PROTONIX) 40 MG tablet TAKE 1 TABLET EVERY DAY 03/31/17  Yes Mikey College, NP  senna-docusate (SENOKOT-S) 8.6-50 MG tablet Take 2 tablets by mouth 2 (two) times daily. 12/08/20  Yes Danford, Suann Larry, MD  sertraline (ZOLOFT) 100 MG tablet Take 1 tablet (100 mg total) by mouth 2 (two) times daily. 07/15/16  Yes Karamalegos, Devonne Doughty, DO  Tetrahydrozoline HCl (VISINE OP) Place 1 drop into both eyes 2 (two) times daily as needed (dry eyes).   Yes [provider]  ascorbic acid (VITAMIN C) 250 MG tablet Take 1 tablet (250 mg total) by mouth 2 (two) times daily. Patient not taking: Reported on 05/26/2022 12/08/20   Edwin Dada, MD  feeding supplement, ENSURE ENLIVE, (ENSURE ENLIVE) LIQD Take 237 mLs by mouth 3 (three) times daily between meals. 07/27/17   [place] Lighter, MD  ferrous sulfate 325 (65 FE) MG tablet Take 1 tablet (325 mg total) by mouth 2 (two) times daily with a meal. Patient not taking: Reported on 05/26/2022 09/09/21   Fritzi Mandes, MD  nicotine (NICODERM CQ - DOSED IN MG/24 HOURS) 21 mg/24hr patch Place 1 patch (21 mg total) onto the skin daily. Patient not taking: Reported on 05/26/2022 12/09/20   Edwin Dada, MD  oxyCODONE (OXY IR/ROXICODONE) 5 MG immediate release tablet Take 1 tablet (5 mg total) by mouth every 4 (four) hours as needed for moderate pain. Patient not taking: Reported on 05/26/2022 12/08/20   Edwin Dada, MD    Social History   Socioeconomic History   Marital status: Married    Spouse name: Not on file   Number of children: Not on file   Years of education: Not on file   Highest education level: Not on file  Occupational History   Occupation: retired  Tobacco  Use   Smoking status: Every Day    Packs/day: 1.00    Types: Cigarettes   Smokeless tobacco: Current  Vaping Use   Vaping Use: Never used  Substance and Sexual Activity   Alcohol use: No   Drug use: No   Sexual activity: Not Currently  Other Topics Concern   Not on file  Social History Narrative   Not on file   Social Determinants of Health   Financial Resource Strain: Medium Risk (11/08/2021)   Overall Financial Resource Strain (CARDIA)    Difficulty of Paying Living Expenses: Somewhat hard  Food Insecurity: No Food Insecurity (05/26/2022)   Hunger Vital Sign    Worried About Running Out of Food in the Last Year: Never true    [place] in the Last Year: Never true  Transportation Needs: No Transportation Needs (05/26/2022)   PRAPARE - Hydrologist (Medical): No    Lack of Transportation (Non-Medical): No  Physical Activity: Not on file  Stress: Not on file  Social Connections: Not on file  Intimate Partner Violence: Not At Risk (05/26/2022)   Humiliation, Afraid, Rape,  and Kick questionnaire    Fear of Current or Ex-Partner: No    Emotionally Abused: No    Physically Abused: No    Sexually Abused: No     Family History  Problem Relation Age of Onset   Alzheimer's disease Father    Lung cancer Sister    Breast cancer Sister    Cancer Mother     ROS: Otherwise negative unless mentioned in HPI  Physical Examination  Vitals:   05/29/22 1900 05/30/22 0512  BP: (!) 137/58 (!) 150/65  Pulse: 97 92  Resp: 18 18  Temp: 98.4 F (36.9 C) 98.1 F (36.7 C)  SpO2: 90% 95%   Body mass index is 21.57 kg/m.  General:  WDWN in NAD Gait: Not observed HENT: WNL, normocephalic Pulmonary: normal non-labored breathing, without Rales, rhonchi,  wheezing Cardiac: regular, without  Murmurs, rubs or gallops; without carotid bruits Abdomen: Positive Bowel sounds, soft, NT/ND, no masses Skin: without rashes Vascular Exam/Pulses: Non Palpable  bilateral lower extremity pulses. Cool to touch.   Extremities: with ischemic changes, without Gangrene , without cellulitis; with open wounds;  Musculoskeletal: no muscle wasting or atrophy  Neurologic: A&O X 3;  No focal weakness or paresthesias are detected; speech is fluent/normal Psychiatric:  The pt has Abnormal- Slow speech post HX CVA  affect. Lymph:  Unremarkable  CBC    Component Value Date/Time   WBC 12.9 (H) 05/27/2022 0416   RBC 4.28 05/27/2022 0416   HGB 11.9 (L) 05/27/2022 0416   HGB 12.1 03/27/2014 1730   HCT 38.1 05/27/2022 0416   HCT 37.0 03/27/2014 1730   PLT 270 05/27/2022 0416   PLT 175 03/27/2014 1730   MCV 89.0 05/27/2022 0416   MCV 97 03/27/2014 1730   MCH 27.8 05/27/2022 0416   MCHC 31.2 05/27/2022 0416   RDW 14.6 05/27/2022 0416   RDW 13.3 03/27/2014 1730   LYMPHSABS 1.2 05/26/2022 1354   LYMPHSABS 1.9 11/25/2013 0401   MONOABS 0.6 05/26/2022 1354   MONOABS 0.7 11/25/2013 0401   EOSABS 0.2 05/26/2022 1354   EOSABS 0.2 11/25/2013 0401   BASOSABS 0.0 05/26/2022 1354   BASOSABS 0.0 11/25/2013 0401    BMET    Component Value Date/Time   NA 137 05/27/2022 0416   NA 140 03/28/2014 0441   K 3.9 05/27/2022 0416   K 4.0 03/28/2014 0441   CL 102 05/27/2022 0416   CL 110 (H) 03/28/2014 0441   CO2 28 05/27/2022 0416   CO2 24 03/28/2014 0441   GLUCOSE 118 (H) 05/27/2022 0416   GLUCOSE 86 03/28/2014 0441   BUN 17 05/27/2022 0416   BUN 19 (H) 03/28/2014 0441   CREATININE 0.80 05/30/2022 0341   CREATININE 0.79 11/24/2015 0908   CALCIUM 9.7 05/27/2022 0416   CALCIUM 8.7 03/28/2014 0441   GFRNONAA >60 05/30/2022 0341   GFRNONAA 76 11/24/2015 0908   GFRAA >60 07/28/2017 0507   GFRAA 87 11/24/2015 0908    COAGS: Lab Results  Component Value Date   INR 1.0 05/26/2022   INR 1.2 09/05/2021   INR 1.0 11/25/2020     Non-Invasive Vascular Imaging:   EXAM: NONINVASIVE PHYSIOLOGIC VASCULAR STUDY OF BILATERAL LOWER EXTREMITIES  FINDINGS: Right  ABI:  0.41  Left ABI:  Not calculated   Right Lower Extremity: Abnormal blunted monophasic arterial waveforms.  < 0.5 Severe PAD   IMPRESSION: Severe right lower extremity peripheral arterial disease with a resting ankle-brachial index of 0.41.  EXAM: RIGHT LOWER EXTREMITY VENOUS DOPPLER  ULTRASOUND  IMPRESSION: Negative. NO DVT   Statin:  Yes.   Beta Blocker:  Yes.   Aspirin:  Yes.   ACEI:  Yes.   ARB:  No. CCB use:  No Other antiplatelets/anticoagulants:  Yes.   Plavix 75 mg   ASSESSMENT/PLAN: This is a 77 y.o. 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  She was found to have a left heel wound with infection. ABI's were obtained and only right lower extremity ABI's were able to be completed due to extreme pain in the left lower extremity. Her right ABI was calculated at 0.41 with abnormal monophasic pulses showing severe right lower extremity PAD.  However her heel ulceration and prior vascular ischemia is on the left.   Vascular Surgery recommends at this time a diagnostic angiogram of her bilateral lower extremities. She may need possible amputation if revascularization is not an option.  She may also need possible angioplasty and possible stent placement to both her lower extremities for revascularization. We would like to start with her left lower extremity with open ulceration. We can complete the right side after recovery from the left lower extremity intervention. Today her BUN = 17 and Creatinine = 0.80  I discussed in detail with her the procedure, benefits, risks and complications. She wishes to proceed with an angiogram of her left lower extremity with possible angioplasty and possible stent placement to help relieve her 10/10 pain. I did discuss possible  amputation and she does not wish to have this done. I will make her npo at midnight for procedure tomorrow.      Drema Pry [facility] 05/30/2022 12:00 PM

## 2022-05-30 NOTE — TOC Initial Note (Signed)
Transition of Care [facility]) - Initial/Assessment Note    Patient Details  Name: Michele Matthews MRN: [id number] Date of Birth: March 02, 1946  Transition of Care [facility]) CM/SW Contact:    Beverly Sessions, RN Phone Number: 05/30/2022, 2:47 PM  Clinical Narrative:                  Admitted for: Left foot infection Admitted from: From home with husband NOB:[id number]  Pharmacy:[facility], and [address] Current home health/prior home health/DME: wC, RW, shower bench, cane, 3 in 1  Patient hard of hearing. Met with patient, husband, and daughter at bedside Daughter transports to appointments.   PT OT eval pending.  Patient and husband state regardless of what the recs are they are not interested in SNF, but would be agreeable to home health services.  Patient has had [facility] in the past and was pleased with their services, would like to use them again.  Referral made to [place] with [facility] for RN PT OT         Patient Goals and CMS Choice            Expected Discharge Plan and Services                                              Prior Living Arrangements/Services                       Activities of Daily Living Home Assistive Devices/Equipment: Gilford Rile (specify type) ADL Screening (condition at time of admission) Patient's cognitive ability adequate to safely complete daily activities?: Yes Is the patient deaf or have difficulty hearing?: No Does the patient have difficulty seeing, even when wearing glasses/contacts?: No Does the patient have difficulty concentrating, remembering, or making decisions?: No Patient able to express need for assistance with ADLs?: Yes Does the patient have difficulty dressing or bathing?: No Independently performs ADLs?: Yes (appropriate for developmental age) Does the patient have difficulty walking or climbing stairs?: Yes Weakness of Legs: Both (wound on L heel) Weakness of Arms/Hands:  None  Permission Sought/Granted                  Emotional Assessment              Admission diagnosis:  Cellulitis of left foot [L03.116] Left foot infection [L08.9] Patient Active Problem List   Diagnosis Date Noted   Hemiparesis affecting left side as late effect of cerebrovascular accident (CVA) ([place]) 05/29/2022   PAD (peripheral artery disease) ([place]) 05/26/2022   HLD (hyperlipidemia) 05/26/2022   Depression with anxiety 05/26/2022   Leg edema, right 05/26/2022   UTI (urinary tract infection) 09/05/2021   Chronic combined systolic and diastolic heart failure ([place]) 09/05/2021   Hypokalemia 11/28/2020   Hyponatremia 11/28/2020   Left foot infection 11/25/2020   Chronic combined systolic and diastolic CHF (congestive heart failure) ([place]) 07/24/2017   Severe protein-calorie malnutrition ([place]) 04/01/2016   Anemia, iron deficiency    Pressure ulcer 12/27/2015   Essential hypertension 12/27/2015   Anemia 10/26/2015   COPD (chronic obstructive pulmonary disease) ([place]) 06/19/2015   Coronary artery disease 06/19/2015   GERD (gastroesophageal reflux disease) 06/19/2015   Hyperlipidemia, unspecified 06/19/2015   Long term current use of anticoagulant therapy 06/19/2015   Anxiety and depression 06/19/2015   Symptomatic anemia 12/10/2014   Upper  GI bleed    History of CVA with residual deficit 12/10/2013   Hemianopsia 12/10/2013   Left-sided weakness 12/10/2013   Tobacco abuse 12/10/2013   PCP:  Dion Body, MD Pharmacy:   [facility] [place] [address] Phone: [phone] Fax: [phone]  [facility] - [place], [place] [address] Phone: [phone] Fax: [place], [place] - [place] [address] [place] [address] Phone: [phone] Fax:  [phone]     Social Determinants of Health (SDOH) Social History: [place]: No Food Insecurity (05/26/2022)  Housing: Low Risk  (05/26/2022)  Transportation Needs: No Transportation Needs (05/26/2022)  Utilities: Not At Risk (05/26/2022)  Financial Resource Strain: Medium Risk (11/08/2021)  Tobacco Use: High Risk (05/26/2022)   SDOH Interventions:     Readmission Risk Interventions     No data to display

## 2022-05-30 NOTE — Consult Note (Signed)
Pharmacy Antibiotic Note  Michele Matthews is a 77 y.o. female admitted on 05/26/2022 with  L foot infection . PMH significant for PAD, HTN, HLD, COPD, CAD, CHF (EF 25-30%), stroke, depression with anxiety, upper GIB. Patient underwent TMA of L foot 11/30/20. Pharmacy has been consulted for vancomycin dosing.  Plan: Day 5 of  Vancomycin 1000 mg IV Q24H. Goal AUC 400-550. Expected AUC: 517 Expected Css min: 12.1 SCr used: 0.80 (actual 0.78)  Weight used: TBW (TBW<IBW,) Vd used: 0.72 (BMI 20.6) Patient also on ceftriaxone 1 g IV Q24H and metronidazole 500 mg BID Continue to monitor renal function and follow culture results  *Expected discharge tomorrow if no surgery. If surgery and Vancomycin to continue, plan to order levels around dose due 05/31/2022.  Height: '5\' 5"'$  (165.1 cm) Weight: 58.8 kg (129 lb 10.1 oz) IBW/kg (Calculated) : 57  Temp (24hrs), Avg:98.7 F (37.1 C), Min:98.1 F (36.7 C), Max:99.6 F (37.6 C)  Recent Labs  Lab 05/26/22 1354 05/26/22 1903 05/27/22 0416 05/29/22 0414 05/30/22 0341  WBC 9.5  --  12.9*  --   --   CREATININE 0.78  --  0.63 0.64 0.80  LATICACIDVEN 1.5 1.0  --   --   --      Estimated Creatinine Clearance: 53 mL/min (by C-G formula based on SCr of 0.8 mg/dL).    No Known Allergies  Antimicrobials this admission: 1/25 Vancomycin >>  1/25 Ceftriaxone >>  1/25 Metronidazole >>   Dose adjustments this admission:N/A  Microbiology results: 1/25 BCx: NG x 4 days  Thank you for allowing pharmacy to be a part of this patient's care.  Asharia Lotter Rodriguez-Guzman PharmD, BCPS 05/30/2022 12:44 PM

## 2022-05-31 ENCOUNTER — Inpatient Hospital Stay: Payer: Medicare HMO

## 2022-05-31 ENCOUNTER — Encounter
Admission: EM | Disposition: A | Discharge status: Home Health | Payer: Self-pay | Source: Home / Self Care | Attending: Student

## 2022-05-31 ENCOUNTER — Encounter: Payer: Self-pay | Admitting: Vascular Surgery

## 2022-05-31 DIAGNOSIS — L089 Local infection of the skin and subcutaneous tissue, unspecified: Secondary | ICD-10-CM | POA: Diagnosis not present

## 2022-05-31 DIAGNOSIS — L03116 Cellulitis of left lower limb: Secondary | ICD-10-CM

## 2022-05-31 DIAGNOSIS — T82856A Stenosis of peripheral vascular stent, initial encounter: Secondary | ICD-10-CM

## 2022-05-31 DIAGNOSIS — I700 Atherosclerosis of aorta: Secondary | ICD-10-CM

## 2022-05-31 DIAGNOSIS — I70244 Atherosclerosis of native arteries of left leg with ulceration of heel and midfoot: Secondary | ICD-10-CM | POA: Diagnosis not present

## 2022-05-31 DIAGNOSIS — I5042 Chronic combined systolic (congestive) and diastolic (congestive) heart failure: Secondary | ICD-10-CM

## 2022-05-31 DIAGNOSIS — Z01810 Encounter for preprocedural cardiovascular examination: Secondary | ICD-10-CM

## 2022-05-31 DIAGNOSIS — I69354 Hemiplegia and hemiparesis following cerebral infarction affecting left non-dominant side: Secondary | ICD-10-CM | POA: Diagnosis not present

## 2022-05-31 HISTORY — PX: LOWER EXTREMITY ANGIOGRAPHY: CATH118251

## 2022-05-31 LAB — CULTURE, BLOOD (ROUTINE X 2)
Culture: NO GROWTH
Culture: NO GROWTH

## 2022-05-31 LAB — CREATININE, SERUM
Creatinine, Ser: 0.72 mg/dL (ref 0.44–1.00)
GFR, Estimated: >60 mL/min (ref >60)

## 2022-05-31 LAB — VANCOMYCIN, RANDOM: Vancomycin Rm: 9 ug/mL

## 2022-05-31 LAB — LIPID PANEL
Cholesterol: 139 mg/dL (ref 0–200)
HDL: 57 mg/dL (ref >40)
LDL Cholesterol: 69 mg/dL (ref 0–99)
Total CHOL/HDL Ratio: 2.4 RATIO
Triglycerides: 66 mg/dL (ref <150)
VLDL: 13 mg/dL (ref 0–40)

## 2022-05-31 SURGERY — LOWER EXTREMITY ANGIOGRAPHY
Anesthesia: Moderate Sedation | Laterality: Left

## 2022-05-31 MED ORDER — SODIUM CHLORIDE 0.9 % IV SOLN: 250.0000 mL | INTRAVENOUS | Status: DC | PRN

## 2022-05-31 MED ORDER — IODIXANOL 320 MG/ML IV SOLN
INTRAVENOUS | Status: DC | PRN
  Administered 2022-05-31: 70 mL

## 2022-05-31 MED ORDER — METHYLPREDNISOLONE SODIUM SUCC 125 MG IJ SOLR: 125.0000 mg | Freq: Once | INTRAMUSCULAR | Status: DC | PRN

## 2022-05-31 MED ORDER — LABETALOL HCL 5 MG/ML IV SOLN
INTRAVENOUS | Status: DC | PRN
  Administered 2022-05-31: 10 mg via INTRAVENOUS

## 2022-05-31 MED ORDER — FENTANYL CITRATE (PF) 100 MCG/2ML IJ SOLN
INTRAMUSCULAR | Status: DC | PRN
  Administered 2022-05-31: 50 ug via INTRAVENOUS
  Administered 2022-05-31 (×2): 12.5 ug via INTRAVENOUS
  Administered 2022-05-31: 25 ug via INTRAVENOUS

## 2022-05-31 MED ORDER — OXYCODONE HCL 5 MG PO TABS
5.0000 mg | ORAL_TABLET | ORAL | Status: DC | PRN
  Administered 2022-05-31 – 2022-06-02 (×3): 5 mg via ORAL
  Administered 2022-06-02 – 2022-06-06 (×9): 10 mg via ORAL
  Administered 2022-06-06: 5 mg via ORAL
  Administered 2022-06-07 – 2022-06-08 (×3): 10 mg via ORAL
  Administered 2022-06-08: 5 mg via ORAL
  Filled 2022-05-31 (×2): qty 2
  Filled 2022-05-31: qty 1
  Filled 2022-05-31 (×4): qty 2
  Filled 2022-05-31: qty 1
  Filled 2022-05-31 (×2): qty 2
  Filled 2022-05-31 (×2): qty 1
  Filled 2022-05-31 (×4): qty 2
  Filled 2022-05-31: qty 1

## 2022-05-31 MED ORDER — SODIUM CHLORIDE 0.9 % IV SOLN: INTRAVENOUS | Status: AC

## 2022-05-31 MED ORDER — ACETAMINOPHEN 325 MG PO TABS
650.0000 mg | ORAL_TABLET | ORAL | Status: DC | PRN
  Administered 2022-06-06: 650 mg via ORAL
  Filled 2022-05-31: qty 2

## 2022-05-31 MED ORDER — SODIUM CHLORIDE 0.9% FLUSH
3.0000 mL | Freq: Two times a day (BID) | INTRAVENOUS | Status: DC
  Administered 2022-05-31 – 2022-06-08 (×14): 3 mL via INTRAVENOUS

## 2022-05-31 MED ORDER — CLOPIDOGREL BISULFATE 75 MG PO TABS
300.0000 mg | ORAL_TABLET | Freq: Once | ORAL | Status: AC
  Administered 2022-05-31: 300 mg via ORAL
  Filled 2022-05-31: qty 4

## 2022-05-31 MED ORDER — LABETALOL HCL 5 MG/ML IV SOLN
INTRAVENOUS | Status: AC
  Filled 2022-05-31: qty 4

## 2022-05-31 MED ORDER — ONDANSETRON HCL 4 MG/2ML IJ SOLN
INTRAMUSCULAR | Status: DC | PRN
  Administered 2022-05-31: 4 mg via INTRAVENOUS

## 2022-05-31 MED ORDER — MORPHINE SULFATE (PF) 4 MG/ML IV SOLN: 2.0000 mg | INTRAVENOUS | Status: DC | PRN

## 2022-05-31 MED ORDER — MIDAZOLAM HCL 2 MG/2ML IJ SOLN
INTRAMUSCULAR | Status: AC
  Filled 2022-05-31: qty 2

## 2022-05-31 MED ORDER — FENTANYL CITRATE PF 50 MCG/ML IJ SOSY: 12.5000 ug | PREFILLED_SYRINGE | Freq: Once | INTRAMUSCULAR | Status: DC | PRN

## 2022-05-31 MED ORDER — ONDANSETRON HCL 4 MG/2ML IJ SOLN: 4.0000 mg | Freq: Four times a day (QID) | INTRAMUSCULAR | Status: DC | PRN

## 2022-05-31 MED ORDER — CLOPIDOGREL BISULFATE 75 MG PO TABS
75.0000 mg | ORAL_TABLET | Freq: Every day | ORAL | Status: DC
  Administered 2022-06-01 – 2022-06-14 (×13): 75 mg via ORAL
  Filled 2022-05-31 (×13): qty 1

## 2022-05-31 MED ORDER — FENTANYL CITRATE (PF) 100 MCG/2ML IJ SOLN
INTRAMUSCULAR | Status: AC
  Filled 2022-05-31: qty 2

## 2022-05-31 MED ORDER — MIDAZOLAM HCL 2 MG/2ML IJ SOLN
INTRAMUSCULAR | Status: DC | PRN
  Administered 2022-05-31: 2 mg via INTRAVENOUS
  Administered 2022-05-31 (×2): .5 mg via INTRAVENOUS
  Administered 2022-05-31: 1 mg via INTRAVENOUS

## 2022-05-31 MED ORDER — SODIUM CHLORIDE 0.9% FLUSH: 3.0000 mL | INTRAVENOUS | Status: DC | PRN

## 2022-05-31 MED ORDER — DIPHENHYDRAMINE HCL 50 MG/ML IJ SOLN: 50.0000 mg | Freq: Once | INTRAMUSCULAR | Status: DC | PRN

## 2022-05-31 MED ORDER — FAMOTIDINE 20 MG PO TABS: 40.0000 mg | ORAL_TABLET | Freq: Once | ORAL | Status: DC | PRN

## 2022-05-31 MED ORDER — HEPARIN SODIUM (PORCINE) 1000 UNIT/ML IJ SOLN
INTRAMUSCULAR | Status: AC
  Filled 2022-05-31: qty 10

## 2022-05-31 MED ORDER — MIDAZOLAM HCL 2 MG/ML PO SYRP: 8.0000 mg | ORAL_SOLUTION | Freq: Once | ORAL | Status: DC | PRN

## 2022-05-31 MED ORDER — HYDROMORPHONE HCL 1 MG/ML IJ SOLN
1.0000 mg | Freq: Once | INTRAMUSCULAR | Status: AC | PRN
  Administered 2022-05-31: 1 mg via INTRAVENOUS
  Filled 2022-05-31: qty 1

## 2022-05-31 MED ORDER — CEFAZOLIN SODIUM-DEXTROSE 2-4 GM/100ML-% IV SOLN
2.0000 g | INTRAVENOUS | Status: DC
  Filled 2022-05-31: qty 100

## 2022-05-31 SURGICAL SUPPLY — 22 items
BALLN LUTONIX DCB 6X40X130 (BALLOONS) ×1
BALLN LUTONIX DCB 6X60X130 (BALLOONS) ×1
BALLOON LUTONIX DCB 6X40X130 (BALLOONS) IMPLANT
BALLOON LUTONIX DCB 6X60X130 (BALLOONS) IMPLANT
CATH ANGIO 5F PIGTAIL 65CM (CATHETERS) IMPLANT
CATH TEMPO 5F RIM 65CM (CATHETERS) IMPLANT
DEVICE SAFEGUARD 24CM (GAUZE/BANDAGES/DRESSINGS) IMPLANT
DEVICE STARCLOSE SE CLOSURE (Vascular Products) IMPLANT
GLIDEWIRE ADV .035X260CM (WIRE) IMPLANT
GOWN STRL REUS W/ TWL LRG LVL3 (GOWN DISPOSABLE) ×1 IMPLANT
GOWN STRL REUS W/TWL LRG LVL3 (GOWN DISPOSABLE) ×1
KIT ENCORE 26 ADVANTAGE (KITS) IMPLANT
NDL ENTRY 21GA 7CM ECHOTIP (NEEDLE) IMPLANT
NEEDLE ENTRY 21GA 7CM ECHOTIP (NEEDLE) ×1 IMPLANT
PACK ANGIOGRAPHY (CUSTOM PROCEDURE TRAY) ×1 IMPLANT
SET INTRO CAPELLA COAXIAL (SET/KITS/TRAYS/PACK) IMPLANT
SHEATH BRITE TIP 5FRX11 (SHEATH) IMPLANT
SHEATH FLEX ANSEL ANG 6F 45CM (SHEATH) IMPLANT
STENT LIFESTAR 8X60X80 (Permanent Stent) IMPLANT
SYR MEDRAD MARK 7 150ML (SYRINGE) IMPLANT
TUBING CONTRAST HIGH PRESS 72 (TUBING) IMPLANT
WIRE GUIDERIGHT .035X150 (WIRE) IMPLANT

## 2022-05-31 NOTE — Consult Note (Cosign Needed Addendum)
Michele Matthews is a 77 y.o. female  [id number]  Primary Cardiologist: Neoma Laming, MD Reason for Consultation: preop clearance  HPI: Patient is a 77 y.o. female with medical history significant of s/p of transmetatarsal amputation of left foot on 11/30/20, PAD, hypertension, hyperlipidemia, COPD, CAD, CHF with EF 25 to 30%, stroke, depression with anxiety, upper GI bleeding, who presented to the ED on 05/26/22 with left foot pain.  She was found to have a left heel wound with infection.  Podiatry consult obtained, patient started antibiotics with Rocephin and vancomycin.   Left lower extremity angiogram completed today revealed diffuse atherosclerotic changes of the left lower extremity, occlusion of the SFA stents as well as the SFA and the popliteal in its entirety, anterior tibial runoff however the this does not appear to cross the ankle.    Review of Systems: denies chest pain   Past Medical History:  Diagnosis Date   Allergy    COPD (chronic obstructive pulmonary disease) ([place])    Coronary artery disease    Depression    Emphysema of lung (HCC)    GERD (gastroesophageal reflux disease)    Hyperlipidemia    Hypertension    Seizures ([place])    Stroke (HCC)     Medications Prior to Admission  Medication Sig Dispense Refill   acetaminophen (TYLENOL) 500 MG tablet Take 2 tablets (1,000 mg total) by mouth 3 (three) times daily. 30 tablet 0   atorvastatin (LIPITOR) 20 MG tablet Take 20 mg by mouth daily.     busPIRone (BUSPAR) 7.5 MG tablet Take 1 tablet (7.5 mg total) by mouth 2 (two) times daily. 180 tablet 3   carvedilol (COREG) 3.125 MG tablet Take 1 tablet (3.125 mg total) by mouth 2 (two) times daily with a meal. 60 tablet 2   clopidogrel (PLAVIX) 75 MG tablet Take 75 mg by mouth daily.     diphenhydramine-acetaminophen (TYLENOL PM) 25-500 MG TABS tablet Take 1 tablet by mouth at bedtime as needed.     furosemide (LASIX) 20 MG tablet Take 1 tablet (20 mg total) by mouth daily.  30 tablet 1   hydrOXYzine (ATARAX/VISTARIL) 10 MG tablet Take 1 tablet (10 mg total) by mouth 2 (two) times daily as needed for anxiety (sleep). 60 tablet 11   lisinopril (ZESTRIL) 5 MG tablet Take 5 mg by mouth daily.     Multiple Vitamin (MULTIVITAMIN) tablet Take 1 tablet by mouth daily.     pantoprazole (PROTONIX) 40 MG tablet TAKE 1 TABLET EVERY DAY 90 tablet 0   senna-docusate (SENOKOT-S) 8.6-50 MG tablet Take 2 tablets by mouth 2 (two) times daily.     sertraline (ZOLOFT) 100 MG tablet Take 1 tablet (100 mg total) by mouth 2 (two) times daily. 180 tablet 3   Tetrahydrozoline HCl (VISINE OP) Place 1 drop into both eyes 2 (two) times daily as needed (dry eyes).     ascorbic acid (VITAMIN C) 250 MG tablet Take 1 tablet (250 mg total) by mouth 2 (two) times daily. (Patient not taking: Reported on 05/26/2022)     feeding supplement, ENSURE ENLIVE, (ENSURE ENLIVE) LIQD Take 237 mLs by mouth 3 (three) times daily between meals. 237 mL 12   ferrous sulfate 325 (65 FE) MG tablet Take 1 tablet (325 mg total) by mouth 2 (two) times daily with a meal. (Patient not taking: Reported on 05/26/2022)  3   nicotine (NICODERM CQ - DOSED IN MG/24 HOURS) 21 mg/24hr patch Place 1 patch (21  mg total) onto the skin daily. (Patient not taking: Reported on 05/26/2022) 28 patch 0   oxyCODONE (OXY IR/ROXICODONE) 5 MG immediate release tablet Take 1 tablet (5 mg total) by mouth every 4 (four) hours as needed for moderate pain. (Patient not taking: Reported on 05/26/2022) 8 tablet 0      atorvastatin  20 mg Oral Daily   busPIRone  7.5 mg Oral BID   carvedilol  3.125 mg Oral BID WC   Chlorhexidine Gluconate Cloth  6 each Topical Once   clopidogrel  300 mg Oral Once   Followed by   Derrill Memo ON 06/01/2022] clopidogrel  75 mg Oral Q breakfast   feeding supplement  237 mL Oral TID BM   fentaNYL       furosemide  20 mg Oral Daily   heparin  5,000 Units Subcutaneous Q8H   heparin sodium (porcine)       labetalol        lisinopril  5 mg Oral Daily   midazolam       midazolam       multivitamin with minerals  1 tablet Oral Daily   nicotine  21 mg Transdermal Daily   pantoprazole  20 mg Oral Daily   sertraline  100 mg Oral Daily   sodium chloride flush  3 mL Intravenous Q12H    Infusions:  sodium chloride     sodium chloride     cefTRIAXone (ROCEPHIN)  IV Stopped (05/30/22 2044)   metronidazole 100 mL/hr at 05/31/22 0625   vancomycin Stopped (05/30/22 2231)    No Known Allergies  Social History   Socioeconomic History   Marital status: Married    Spouse name: Not on file   Number of children: Not on file   Years of education: Not on file   Highest education level: Not on file  Occupational History   Occupation: retired  Tobacco Use   Smoking status: Every Day    Packs/day: 1.00    Types: Cigarettes   Smokeless tobacco: Current  Vaping Use   Vaping Use: Never used  Substance and Sexual Activity   Alcohol use: No   Drug use: No   Sexual activity: Not Currently  Other Topics Concern   Not on file  Social History Narrative   Not on file   Social Determinants of Health   Financial Resource Strain: Medium Risk (11/08/2021)   Overall Financial Resource Strain (CARDIA)    Difficulty of Paying Living Expenses: Somewhat hard  Food Insecurity: No Food Insecurity (05/26/2022)   Hunger Vital Sign    Worried About Running Out of Food in the Last Year: Never true    Ran Out of Food in the Last Year: Never true  Transportation Needs: No Transportation Needs (05/26/2022)   PRAPARE - Hydrologist (Medical): No    Lack of Transportation (Non-Medical): No  Physical Activity: Not on file  Stress: Not on file  Social Connections: Not on file  Intimate Partner Violence: Not At Risk (05/26/2022)   Humiliation, Afraid, Rape, and Kick questionnaire    Fear of Current or Ex-Partner: No    Emotionally Abused: No    Physically Abused: No    Sexually Abused: No    Family  History  Problem Relation Age of Onset   Alzheimer's disease Father    Lung cancer Sister    Breast cancer Sister    Cancer Mother     PHYSICAL EXAM: Vitals:   05/31/22 1200  05/31/22 1215  BP: (!) 162/72 (!) 159/69  Pulse: 83 83  Resp: (!) 24   Temp:    SpO2: 92% 92%     Intake/Output Summary (Last 24 hours) at 05/31/2022 1443 Last data filed at 05/31/2022 0625 Gross per 24 hour  Intake 517.7 ml  Output 800 ml  Net -282.3 ml    General:  Well appearing. No respiratory difficulty HEENT: normal Neck: supple. no JVD. Carotids 2+ bilat; no bruits. No lymphadenopathy or thryomegaly appreciated. Cor: PMI nondisplaced. Regular rate & rhythm. No rubs, gallops or murmurs. Lungs: clear Abdomen: soft, nontender, nondistended. No hepatosplenomegaly. No bruits or masses. Good bowel sounds. Extremities: no cyanosis, clubbing, rash, edema Neuro: alert & oriented x 3, cranial nerves grossly intact. moves all 4 extremities w/o difficulty. Affect pleasant.  ECG: sinus tachycardia with PVCs  Results for orders placed or performed during the hospital encounter of 05/26/22 (from the past 24 hour(s))  Lipid panel     Status: None   Collection Time: 05/31/22  3:52 AM  Result Value Ref Range   Cholesterol 139 0 - 200 mg/dL   Triglycerides 66 <150 mg/dL   HDL 57 >40 mg/dL   Total CHOL/HDL Ratio 2.4 RATIO   VLDL 13 0 - 40 mg/dL   LDL Cholesterol 69 0 - 99 mg/dL   PERIPHERAL VASCULAR CATHETERIZATION  Result Date: 05/31/2022 See surgical note for result.  US ARTERIAL ABI (SCREENING LOWER EXTREMITY)  Result Date: 05/30/2022 CLINICAL DATA:  Peripheral arterial disease EXAM: NONINVASIVE PHYSIOLOGIC VASCULAR STUDY OF BILATERAL LOWER EXTREMITIES TECHNIQUE: Evaluation of both lower extremities were performed at rest, including calculation of ankle-brachial indices with single level Doppler, pressure and pulse volume recording. COMPARISON:  None Available. FINDINGS: Right ABI:  0.41 Left ABI:  Not  calculated Right Lower Extremity: Abnormal blunted monophasic arterial waveforms. < 0.5 Severe PAD IMPRESSION: Severe right lower extremity peripheral arterial disease with a resting ankle-brachial index of 0.41. Electronically Signed   By: Jacqulynn Cadet M.D.   On: 05/30/2022 05:34     ASSESSMENT AND PLAN: Patient laying comfortably in bed, post angiogram. Denies chest pain, shortness of breath. Patient reports not smoking tobacco since being in the hospital. Patient needing cardiac clearance for surgery for either bypass or AKA. Patient echo 06/2017 revealed EF 25-30%. Will repeat echo. Patient will be moderate to high risk for surgery. Will decide on clearance based on current echo results. Will continue to follow.    Engineer, drilling FNP-C

## 2022-05-31 NOTE — Progress Notes (Signed)
PT Cancellation Note  Patient Details Name: CAROLY PUREWAL MRN: [id number] DOB: 1945/05/10   Cancelled Treatment:    Reason Eval/Treat Not Completed: Patient at procedure or test/unavailable. PT will continue with attempts.   Minna Merritts, PT, MPT  Percell Locus 05/31/2022, 10:02 AM

## 2022-05-31 NOTE — Progress Notes (Signed)
I checked on the patient this evening post procedure.  She is trying to sit up in bed.  Although at first she seemed mildly confused she did understand that she had surgery earlier today and actually volunteered that she had had a stent placed.  I informed her that she will need further surgery to completely revascularize her left lower extremity especially given the amount of infection that we have been trying to treat.  At this point I would like to communicate with her husband and perhaps one of her daughters who is involved with her medical affairs regarding the need for future surgery and what the femoral to tibial bypass will entail.Marland Kitchen

## 2022-05-31 NOTE — Op Note (Signed)
OPERATIVE NOTE  [facility]  Percutaneous Study/Intervention Procedural Note    PROCEDURE: Left lower extremity angiography third order catheter placement. Left lower extremity angiography. Percutaneous transluminal angioplasty and stent placement left external iliac artery Ultrasound-guided access right common femoral artery for sheath placement  PRE-OPERATIVE DIAGNOSIS: Atherosclerotic occlusive disease bilateral lower extremities with ulceration of the left foot associated with severe cellulitis.  POST-OPERATIVE DIAGNOSIS: Same  SURGEON: Katha Cabal, M.D.  ANESTHESIA: Conscious sedation was administered under my direct supervision by the interventional radiology RN. IV Versed plus fentanyl were utilized. Continuous ECG, pulse oximetry and blood pressure was monitored throughout the entire procedure.  Conscious sedation was for a total of 49 minutes.  ESTIMATED BLOOD LOSS: Minimal cc  FINDING(S): 1.  Diffuse atherosclerotic changes of the left lower extremity, occlusion of the SFA stents as well as the SFA and the popliteal in its entirety.  There appears to be anterior tibial runoff however the this does not appear to cross the ankle  SPECIMEN(S):  None  INDICATIONS:   Michele Matthews is a 77 y.o. y.o. female who presents with signs of sepsis secondary to infection of the right heel ulcer. He has nonpalpable pulses in association with the heel ulcer. The heel ulcer has not been healing in spite of appropriate wound care.  DESCRIPTION: After obtaining full informed written consent, the patient was brought back to the operating room and placed supine upon the operating table.  The patient received IV antibiotics prior to induction.  After obtaining adequate sedation, the patient was prepped and draped in the standard fashion and appropriate time out is called.    Ultrasound is placed in a sterile sleeve ultrasound as utilized secondary to lack of  appropriate landmarks and to avoid vascular injury. Under real-time visualization the right common femoral artery is identified is echolucent and pulsatile indicating patency. Image is recorded for the permanent record. 1% lidocaine is then infiltrated into the soft tissues with ultrasound visualization. Microneedle is then inserted into the anterior wall of the common femoral artery under direct visualization with ultrasound. Microwire followed by micro-sheath.   J-wire followed by 5 French sheath is then inserted without difficulty.  J-wire is then advanced with pigtail catheter up to the level of T12 and AP projection of the aorta is obtained. Pigtail catheter was repositioned to above the bifurcation and oblique view of the pelvis is obtained.  Using an advantage Glide Wire wire and a rim catheter the aortic bifurcation was crossed and the wire is advanced down to the external iliac artery and then into the profunda femoris artery.  6000 units of heparin was given and allowed to circulate.  A 6 French Ansell sheath was then advanced up and over the aortic bifurcation position with its tip in the mid common iliac artery.  Magnified imaging of the external iliac artery stenosis is then created and an 8 mm x 60 mm life star stent is deployed extending into the common iliac artery slightly.  Is then postdilated with a 6 mm x 60 mm Lutonix drug-eluting balloon.  Distally the stent is fully expanded with a 6 mm x 40 mm Lutonix drug-eluting balloon.  Both inflations were to 6 to 10 atm for approximately 1 minute.  Follow-up imaging demonstrates less than 10% residual stenosis with wide patency of the internal iliac artery.  The sheath and catheter were then advanced into the profunda femoris under direct fluoroscopic guidance and distal runoff is obtained.  After review  of the images the catheter is removed sheath is pulled back into the left iliac system J-wire is then advanced and socially and LAO projection  of the groin is obtained after review of this image a Star Cose device is deployed without difficulty.  INTERPRETATION: The abdominal aorta is opacified with a bolus injection of contrast. There is diffuse atherosclerotic changes clearly visible even under fluoroscopy without contrast enhancement. However, there are no hemodynamically significant lesions noted within the aorta, bilateral common iliac arteries and the right external iliac arteries are widely patent.  There is a greater than 80% ostial stenosis of the left external iliac artery then diffuse greater than 50% stenosis throughout the midportion with a greater than 60% stenosis distally and mild poststenotic dilatation.  The left common femoral is free of hemodynamically significant stenosis.  There is a greater than 80% ostial stenosis of the profunda femoris a artery. The left superficial femoral artery demonstrates numerous previously placed stents.  It is occluded at its origin and remains occluded throughout its entire length including the entire length of the popliteal artery.  There is reconstitution of the anterior tibial artery and the tibial peroneal trunk although it is difficult to say if there is disease within the distal popliteal and proximal tibioperoneal trunk secondary to motion artifact.  The anterior tibial appears to be the dominant runoff to the foot but occludes at the ankle.  The peroneal is patent but is quite small and does not appear to reach the ankle.  The posterior tibial is occluded in its proximal one third it reconstitutes as a threadlike artery and is patent down to the plantar arteries but seems quite small and heavily diseased throughout.  Following angioplasty and stent placement of the external iliac artery there is wide patency less than 10% residual stenosis.  SUMMARY: This is a very difficult situation.  I believe that the intervention within the left external iliac iliac artery will improve flow and  allow for further distal treatment but in and of itself will not provide adequate increased perfusion for wound healing.  Given the extensive and lengthy stenting of the SFA and popliteal as well as what appears to be profoundly diseased trifurcation reintervention is not appropriate.  Although there was excessive motion artifact given the patient's inability to stay still it does appear that a femoral to TP trunk bypass is an option.  But this would only be feasible if endarterectomy of the distal popliteal and proximal to the tibioperoneal trunk could be performed so that the anterior tibial and the peroneal are both recruited as outflow.  Of note the origin of the profunda femoris could be treated with endarterectomy at this time which would again improve flow but just this in and of itself would not provide adequate perfusion for healing.  The patient will need medical and cardiology clearance for bypass.  COMPLICATIONS: There were no immediate complications.  CONDITION: Michele Matthews, M.D.  [facility] Office: [phone]   05/31/2022,12:50 PM

## 2022-05-31 NOTE — Progress Notes (Signed)
  Progress Note   [name] EXN:[id number] DOB: [birth year] DOA: 05/26/2022     5 DOS: the patient was seen and examined on 05/31/2022   Brief hospital course: KOHANA AMBLE is a 77 y.o. 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  She was found to have a left heel wound with infection.  Podiatry consult obtained, patient started antibiotics with Rocephin and vancomycin. PAD, seen by vascular surgery, scheduled surgery today.  Assessment and Plan: Left foot infection:  Peripheral arterial disease. No evidence of sepsis.  Patient has been seen by podiatry, started on vancomycin, Flagyl and Rocephin. Patient was not able to tolerate MRI even give her dose of Ativan.  Currently pending ABI test to determine the next course of action.  Given high dose of oxycodone prior to the ABI testing as patient has unable to tolerate. Discussed with Dr. Posey Pronto, patient does not need debridement.  Recommend antibiotic treatment and follow-up with wound care. Also patient will be seen by vascular surgery, surgery today.   Chronic combined systolic and diastolic heart failure ([place]): 2D echo on 07/24/2017 showed EF 25 to 30% with grade 2 diastolic dysfunction.  Patient does not have any short of breath, BNP 284, patient does not have exacerbation congestive heart failure.   Appear euvolemic.   COPD (chronic obstructive pulmonary disease) ([place]): Stable   History of stroke with left hemiparesis. Continue home medicines.     Coronary artery disease:  Followed, Plavix on hold due to scheduled surgery.   Essential hypertension On Coreg and lisinopril   Depression with anxiety -Continue home medications   Tobacco abuse -nicotine patch Advised to quit.      Subjective:  Patient still  complains of left heel pain, seem to be better today.  Physical Exam: Vitals:   05/31/22 1113 05/31/22 1118 05/31/22 1123 05/31/22 1128  BP: (!) 166/103 (!) 167/79 (!) 167/87 (!) 147/99  Pulse: 78 79 85 83  Resp: (!) '21 20 20 '$ (!) 22  Temp:      TempSrc:      SpO2: 96% 91% 93% (!) 89%  Weight:      Height:       General exam: Appears calm and comfortable  Respiratory system: Clear to auscultation. Respiratory effort normal. Cardiovascular system: S1 & S2 heard, RRR. No JVD, murmurs, rubs, gallops or clicks. No pedal edema. Gastrointestinal system: Abdomen is nondistended, soft and nontender. No organomegaly or masses felt. Normal bowel sounds heard. Central nervous system: Alert and oriented. No focal neurological deficits. Extremities: Left heel wound. Skin: No rashes, lesions or ulcers Psychiatry: Judgement and insight appear normal. Mood & affect appropriate.   Data Reviewed:  Lab results reviewed.  Family Communication: None  Disposition: Status is: Inpatient Remains inpatient appropriate because: Severity of disease, IV antibiotics, inpatient procedure.  Planned Discharge Destination: Home with Home Health    Time spent: 35 minutes  Author: Sharen Hones, MD 05/31/2022 12:03 PM  For on call review [email or web address].

## 2022-05-31 NOTE — Interval H&P Note (Signed)
History and Physical Interval Note:  05/31/2022 10:42 AM  Michele Matthews  has presented today for surgery, with the diagnosis of PAD.  The various methods of treatment have been discussed with the patient and family. After consideration of risks, benefits and other options for treatment, the patient has consented to  Procedure(s): Lower Extremity Angiography (Left) as a surgical intervention.  The patient's history has been reviewed, patient examined, no change in status, stable for surgery.  I have reviewed the patient's chart and labs.  Questions were answered to the patient's satisfaction.     Hortencia Pilar

## 2022-06-01 ENCOUNTER — Inpatient Hospital Stay: Admit: 2022-06-01 | Discharge: 2022-06-01 | Disposition: A | Discharge status: Home / Self Care | Payer: Medicare HMO

## 2022-06-01 ENCOUNTER — Inpatient Hospital Stay: Payer: Medicare HMO

## 2022-06-01 DIAGNOSIS — L089 Local infection of the skin and subcutaneous tissue, unspecified: Secondary | ICD-10-CM | POA: Diagnosis not present

## 2022-06-01 LAB — CBC
HCT: 33.6 % — ABNORMAL LOW (ref 36.0–46.0)
Hemoglobin: 10.4 g/dL — ABNORMAL LOW (ref 12.0–15.0)
MCH: 27.4 pg (ref 26.0–34.0)
MCHC: 31 g/dL (ref 30.0–36.0)
MCV: 88.7 fL (ref 80.0–100.0)
Platelets: 207 10*3/uL (ref 150–400)
RBC: 3.79 MIL/uL — ABNORMAL LOW (ref 3.87–5.11)
RDW: 15.7 % — ABNORMAL HIGH (ref 11.5–15.5)
WBC: 9.3 10*3/uL (ref 4.0–10.5)
nRBC: 0 % (ref 0.0–0.2)

## 2022-06-01 LAB — ECHOCARDIOGRAM COMPLETE
AR max vel: 2.54 cm2
AV Area VTI: 2.43 cm2
AV Area mean vel: 2.49 cm2
AV Mean grad: 6 mmHg
AV Peak grad: 9.9 mmHg
Ao pk vel: 1.57 m/s
Area-P 1/2: 4.29 cm2
Calc EF: 40.3 %
Height: 65 in
S' Lateral: 4.5 cm
Single Plane A2C EF: 37.1 %
Single Plane A4C EF: 44.3 %
Weight: 2109.36 oz

## 2022-06-01 LAB — MAGNESIUM: Magnesium: 2 mg/dL (ref 1.7–2.4)

## 2022-06-01 LAB — PHOSPHORUS: Phosphorus: 3 mg/dL (ref 2.5–4.6)

## 2022-06-01 LAB — TSH: TSH: 3.152 u[IU]/mL (ref 0.350–4.500)

## 2022-06-01 LAB — BASIC METABOLIC PANEL
Anion gap: 9 (ref 5–15)
BUN: 15 mg/dL (ref 8–23)
CO2: 25 mmol/L (ref 22–32)
Calcium: 8.7 mg/dL — ABNORMAL LOW (ref 8.9–10.3)
Chloride: 102 mmol/L (ref 98–111)
Creatinine, Ser: 0.62 mg/dL (ref 0.44–1.00)
GFR, Estimated: >60 mL/min (ref >60)
Glucose, Bld: 98 mg/dL (ref 70–99)
Potassium: 3.7 mmol/L (ref 3.5–5.1)
Sodium: 136 mmol/L (ref 135–145)

## 2022-06-01 LAB — VANCOMYCIN, RANDOM: Vancomycin Rm: 9 ug/mL

## 2022-06-01 LAB — VANCOMYCIN, PEAK: Vancomycin Pk: 41 ug/mL — ABNORMAL HIGH (ref 30–40)

## 2022-06-01 NOTE — Progress Notes (Addendum)
[facility] Progress Note  [name]    SWN:[id number]  DOA: 05/26/2022     Date of Service: the patient was seen and examined on 06/01/2022  Chief Complaint  Patient presents with   Wound Infection   Brief hospital course: Michele Matthews is a 77 y.o. 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  She was found to have a left heel wound with infection.  Podiatry consult obtained, patient started antibiotics with Rocephin and vancomycin. PAD, seen by vascular surgery, scheduled surgery today.     Assessment and Plan: Left foot infection:  Peripheral arterial disease. No evidence of sepsis.  Patient has been seen by podiatry, started on vancomycin, Flagyl and Rocephin. Patient was not able to tolerate MRI even give her dose of Ativan.  Currently pending ABI test to determine the next course of action.  Given high dose of oxycodone prior to the ABI testing as patient has unable to tolerate. Discussed with Dr. Posey Pronto, patient does not need debridement.  Recommend antibiotic treatment and follow-up with wound care. Vascular surgery consulted, s/p LLE angiogram,  Diffuse atherosclerotic changes of the left lower extremity, occlusion of the SFA stents as well as the SFA and the popliteal in its entirety.  There appears to be anterior tibial runoff however the this does not appear to cross the ankle. Continue Plavix    Chronic combined systolic and diastolic heart failure ([place]): 2D echo on 07/24/2017 showed EF 25 to 30% with grade 2 diastolic dysfunction.  Patient does not have any short of breath, BNP 284, patient does not have exacerbation congestive heart failure.   Appear euvolemic. Continued Lasix 20 mg twice daily, Coreg 3.125 twice daily, lisinopril 5 mg twice daily  home dose.  COPD (chronic obstructive pulmonary disease): Stable   History of stroke with left hemiparesis. Continue home medicines.  1/31 c/o right-sided weakness since admission which is new 1/31 CT head: Hypodensity in the left aspect of the pons and midbrain may reflect artifact; however, acute infarct is not excluded. Recommend brain MRI for further evaluation. Patient is refusing MRI, cannot tolerate it  Continue monitor on telemetry, continue neurocheck every 4 hourly Cautions fall and aspiration precautions Ambulate with assistance, frequent turning every 8 hourly Check TSH level Repeat CT head tomorrow a.m. after 24 hours   Coronary artery disease:  S/p Plavix 300 po once, followed by Plavix 75 mg p.o. daily   Essential hypertension On Coreg and lisinopril   Depression with anxiety -Continue home medications   Tobacco abuse -nicotine patch Advised to quit.    Body mass index is 21.94 kg/m.  Interventions:       Diet: Heart healthy diet DVT Prophylaxis: Subcutaneous Heparin    Advance goals of care discussion: Full code  Family Communication: family was not present at bedside, at the time of interview.  The pt provided permission to discuss medical plan with the family. Opportunity was given to ask question and all questions were answered satisfactorily.   Disposition:  Pt is from Home, admitted with left foot infection, peripheral vascular disease, still has PVD, vascular surgery following, which precludes a safe discharge. Discharge to SNF, when clinically stable, need vascular surgery clearance.  Subjective: No significant events overnight, patient presented with left foot infection, found to have PVD.  Patient  stated that her pain is under control, PT noticed worsening of right-sided weakness and patient stated to RN that she has weakness on the right side since admission which is new.  Patient has residual weakness on the left side. Patient denied any  other active issues.  Physical Exam: General: NAD, lying comfortably Appear in no distress, affect appropriate Eyes: PERRLA ENT: Oral Mucosa Clear, moist  Neck: no JVD,  Cardiovascular: S1 and S2 Present, no Murmur,  Respiratory: good respiratory effort, Bilateral Air entry equal and Decreased, no Crackles, no wheezes Abdomen: Bowel Sound present, Soft and no tenderness,  Skin: no rashes Extremities: no Pedal edema, no calf tenderness Neurologic: Residual weakness on the left side.  Weak right side which is new Gait not checked due to patient safety concerns  Vitals:   05/31/22 1933 06/01/22 0407 06/01/22 0408 06/01/22 0810  BP: (!) 136/55 (!) 145/77  114/62  Pulse: (!) 104 (!) 106  (!) 109  Resp: '20 20  18  '$ Temp: 98.2 F (36.8 C) 98.4 F (36.9 C)  99 F (37.2 C)  TempSrc: Oral Oral    SpO2: 91% 94%  91%  Weight:   59.8 kg   Height:        Intake/Output Summary (Last 24 hours) at 06/01/2022 1523 Last data filed at 06/01/2022 1049 Gross per 24 hour  Intake 606.42 ml  Output 700 ml  Net -93.58 ml   Filed Weights   05/31/22 0500 05/31/22 0944 06/01/22 0408  Weight: 57.4 kg 57.4 kg 59.8 kg    Data Reviewed: I have personally reviewed and interpreted daily labs, tele strips, imagings as discussed above. I reviewed all nursing notes, pharmacy notes, vitals, pertinent old records I have discussed plan of care as described above with RN and patient/family.  CBC: Recent Labs  Lab 05/26/22 1354 05/27/22 0416 06/01/22 0918  WBC 9.5 12.9* 9.3  NEUTROABS 7.4  --   --   HGB 13.2 11.9* 10.4*  HCT 43.1 38.1 33.6*  MCV 89.4 89.0 88.7  PLT 306 270 810   Basic Metabolic Panel: Recent Labs  Lab 05/26/22 1354 05/27/22 0416 05/29/22 0414 05/30/22 0341 05/31/22 2205 06/01/22 0918  NA 138 137  --   --   --  136  K 4.3 3.9  --   --   --  3.7  CL 102 102  --   --   --  102  CO2 25 28  --   --   --  25  GLUCOSE 101* 118*  --   --   --  98  BUN 20 17  --   --   --  15   CREATININE 0.78 0.63 0.64 0.80 0.72 0.62  CALCIUM 9.7 9.7  --   --   --  8.7*  MG  --   --   --   --   --  2.0  PHOS  --   --   --   --   --  3.0    Studies: CT HEAD WO CONTRAST (5MM)  Result Date: 06/01/2022 CLINICAL DATA:  New right-sided weakness EXAM: CT HEAD WITHOUT CONTRAST TECHNIQUE: Contiguous axial images were obtained from the base of the skull through the vertex without intravenous contrast. RADIATION DOSE REDUCTION: This exam was performed according to the departmental dose-optimization program which includes automated exposure control, adjustment of the mA and/or kV according to patient size and/or use of iterative reconstruction technique. COMPARISON:  CT head 09/05/2021, MR head 09/06/2021  FINDINGS: Brain: There is hypodensity in the left aspect of the pons and midbrain which may be artifactual; however, acute infarct can not be excluded. There is no evidence of acute intracranial hemorrhage or extra-axial fluid collection. Multiple remote infarcts in the right frontal and parietal lobes in the MCA distribution and left occipital lobe in the PCA distribution are unchanged. Parenchymal volume is stable. The ventricles are stable in size, with unchanged ex vacuo dilatation of the lateral ventricles related to the infarcts. The pituitary and suprasellar region are normal. There is no mass lesion. There is no mass effect or midline shift. Vascular: There is calcification of the bilateral carotid siphons and vertebral arteries. Skull: Normal. Negative for fracture or focal lesion. Sinuses/Orbits: The imaged paranasal sinuses are clear. The globes and orbits are unremarkable. Other: None. IMPRESSION: Hypodensity in the left aspect of the pons and midbrain may reflect artifact; however, acute infarct is not excluded. Recommend brain MRI for further evaluation. These results will be called to the ordering clinician or representative by the Radiologist Assistant, and communication documented in the  PACS or [employer]. Electronically Signed   By: Valetta Mole M.D.   On: 06/01/2022 13:00   ECHOCARDIOGRAM COMPLETE  Result Date: 06/01/2022    ECHOCARDIOGRAM REPORT   Patient Name:   Michele Matthews Date of Exam: 06/01/2022 Medical Rec #:  [id number]     Height:       65.0 in Accession #:    [id number]    Weight:       131.8 lb Date of Birth:  March 21, 1946     BSA:          1.657 m Patient Age:    15 years      BP:           114/62 mmHg Patient Gender: F             HR:           109 bpm. Exam Location:  [facility] Procedure: 2D Echo, Color Doppler and Cardiac Doppler Indications:     Cardiomyopathy-unspecified I42.9  History:         Patient has prior history of Echocardiogram examinations, most                  recent 07/25/2017. COPD and Stroke; Risk Factors:Hypertension.  Sonographer:     Sherrie Sport Referring Phys:  [id number] AMBER SCOGGINS Diagnosing Phys: Neoma Laming  Sonographer Comments: Suboptimal parasternal window. IMPRESSIONS  1. Left ventricular ejection fraction, by estimation, is 40 to 45%. The left ventricle has mildly decreased function. The left ventricle demonstrates global hypokinesis. There is severe concentric left ventricular hypertrophy. Left ventricular diastolic  parameters are consistent with Grade I diastolic dysfunction (impaired relaxation).  2. Right ventricular systolic function is normal. The right ventricular size is normal.  3. 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 Left atrial size was severely dilated.  4. Right atrial size was moderately dilated.  5. The mitral valve is normal in structure. Mild mitral valve regurgitation. No evidence of mitral stenosis.  6. The aortic valve is normal in structure. Aortic valve regurgitation is not visualized. Aortic valve sclerosis/calcification is present, without any evidence of aortic stenosis.  7. The inferior vena cava is normal in size with greater than 50% respiratory variability, suggesting right atrial pressure  of 3 mmHg. FINDINGS  Left Ventricle: Left ventricular ejection fraction, by estimation, is 40 to 45%. The left ventricle has mildly  decreased function. The left ventricle demonstrates global hypokinesis. The left ventricular internal cavity size was normal in size. There is  severe concentric left ventricular hypertrophy. Left ventricular diastolic parameters are consistent with Grade I diastolic dysfunction (impaired relaxation). Right Ventricle: The right ventricular size is normal. No increase in right ventricular wall thickness. Right ventricular systolic function is normal. 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 Left atrial size was severely dilated. Right Atrium: Right atrial size was moderately dilated. Pericardium: There is no evidence of pericardial effusion. Mitral Valve: The mitral valve is normal in structure. Mild mitral valve regurgitation. No evidence of mitral valve stenosis. Tricuspid Valve: The tricuspid valve is normal in structure. Tricuspid valve regurgitation is mild . No evidence of tricuspid stenosis. Aortic Valve: The aortic valve is normal in structure. Aortic valve regurgitation is not visualized. Aortic valve sclerosis/calcification is present, without any evidence of aortic stenosis. Aortic valve mean gradient measures 6.0 mmHg. Aortic valve peak  gradient measures 9.9 mmHg. Aortic valve area, by VTI measures 2.43 cm. Pulmonic Valve: The pulmonic valve was normal in structure. Pulmonic valve regurgitation is not visualized. No evidence of pulmonic stenosis. Aorta: The aortic root is normal in size and structure. Venous: The inferior vena cava is normal in size with greater than 50% respiratory variability, suggesting right atrial pressure of 3 mmHg. IAS/Shunts: No atrial level shunt detected by color flow Doppler. 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  LEFT VENTRICLE PLAX 2D LVIDd:         5.00 cm       Diastology LVIDs:         4.50 cm      LV e' medial:    9.68 cm/s LV PW:         1.10 cm      LV E/e' medial:  7.5 LV IVS:        1.20 cm      LV e' lateral:   11.10 cm/s LVOT diam:     2.30 cm      LV E/e' lateral: 6.6 LV SV:         71 LV SV Index:   43 LVOT Area:     4.15 cm  LV Volumes (MOD) LV vol d, MOD A2C: 112.0 ml LV vol d, MOD A4C: 128.0 ml LV vol s, MOD A2C: 70.5 ml LV vol s, MOD A4C: 71.3 ml LV SV MOD A2C:     41.5 ml LV SV MOD A4C:     128.0 ml LV SV MOD BP:      48.4 ml RIGHT VENTRICLE RV Basal diam:  2.80 cm RV Mid diam:    2.40 cm RV S prime:     17.60 cm/s TAPSE (M-mode): 2.4 cm LEFT ATRIUM             Index        RIGHT ATRIUM          Index LA diam:        2.80 cm 1.69 cm/m   RA Area:     9.98 cm LA Vol (A2C):   33.6 ml 20.28 ml/m  RA Volume:   20.00 ml 12.07 ml/m LA Vol (A4C):   75.2 ml 45.38 ml/m LA Biplane Vol: 51.7 ml 31.20 ml/m  AORTIC VALVE AV Area (Vmax):    2.54 cm AV Area (Vmean):   2.49 cm AV Area (VTI):     2.43  cm AV Vmax:           157.00 cm/s AV Vmean:          111.000 cm/s AV VTI:            0.294 m AV Peak Grad:      9.9 mmHg AV Mean Grad:      6.0 mmHg LVOT Vmax:         95.90 cm/s LVOT Vmean:        66.600 cm/s LVOT VTI:          0.172 m LVOT/AV VTI ratio: 0.59  AORTA Ao Root diam: 3.00 cm MITRAL VALVE                TRICUSPID VALVE MV Area (PHT): 4.29 cm     TR Peak grad:   14.1 mmHg MV Decel Time: 177 msec     TR Vmax:        188.00 cm/s MV E velocity: 72.80 cm/s MV A velocity: 112.00 cm/s  SHUNTS MV E/A ratio:  0.65         Systemic VTI:  0.17 m                             Systemic Diam: 2.30 cm Neoma Laming Electronically signed by Neoma Laming Signature Date/Time: 06/01/2022/11:26:09 AM    Final    [place] SAPHENOUS VEIN MAPPING LEFT  Result Date: 06/01/2022 CLINICAL DATA:  77 year old female for bypass, preoperative left lower extremity venous mapping EXAM: LEFT LOWER EXTREMITY VENOUS ULTRASOUND TECHNIQUE: Gray-scale sonography with color Doppler and duplex  ultrasound were performed to evaluate the superficial venous systems of the left leg. A venous map accompanies the imaging. COMPARISON:  None Available. FINDINGS: Proximal great saphenous vein ranges in diameter 3.6 mm-6.0 mm. Diameter in the calf estimated 2.2 mm-2.9 mm. Depth range estimated 2.7 mm-10.9 mm. Map accompanies the imaging. IMPRESSION: Left lower extremity superficial venous mapping as above. Electronically Signed   By: Corrie Mckusick D.O.   On: 06/01/2022 08:26    Scheduled Meds:  atorvastatin  20 mg Oral Daily   busPIRone  7.5 mg Oral BID   carvedilol  3.125 mg Oral BID WC   Chlorhexidine Gluconate Cloth  6 each Topical Once   clopidogrel  75 mg Oral Q breakfast   feeding supplement  237 mL Oral TID BM   furosemide  20 mg Oral Daily   heparin  5,000 Units Subcutaneous Q8H   lisinopril  5 mg Oral Daily   multivitamin with minerals  1 tablet Oral Daily   nicotine  21 mg Transdermal Daily   pantoprazole  20 mg Oral Daily   sertraline  100 mg Oral Daily   sodium chloride flush  3 mL Intravenous Q12H   Continuous Infusions:  sodium chloride     cefTRIAXone (ROCEPHIN)  IV Stopped (05/31/22 1841)   metronidazole 500 mg (06/01/22 0407)   vancomycin Stopped (05/31/22 2251)   PRN Meds: sodium chloride, acetaminophen, acetaminophen, albuterol, dextromethorphan-guaiFENesin, fentaNYL (SUBLIMAZE) injection, hydrALAZINE, hydrOXYzine, melatonin, morphine injection, morphine injection, ondansetron (ZOFRAN) IV, ondansetron (ZOFRAN) IV, ondansetron (ZOFRAN) IV, oxyCODONE, oxyCODONE-acetaminophen, senna-docusate, sodium chloride flush, tetrahydrozoline  Time spent: 50 minutes  Author: Val Riles. MD [facility] 06/01/2022 3:23 PM  To reach On-call, see care teams to locate the attending and reach out to them via [email or web address]. If 7PM-7AM, please contact night-coverage If you still have difficulty reaching the attending provider, please page the  DOC (Director on Call) for [facility] on amion for assistance.

## 2022-06-01 NOTE — Progress Notes (Signed)
*  PRELIMINARY RESULTS* Echocardiogram 2D Echocardiogram has been performed.  Michele Matthews 06/01/2022, 10:50 AM

## 2022-06-01 NOTE — Consult Note (Signed)
Pharmacy Antibiotic Note  Michele Matthews is a 77 y.o. female admitted on 05/26/2022 with  L foot infection . PMH significant for PAD, HTN, HLD, COPD, CAD, CHF (EF 25-30%), stroke, depression with anxiety, upper GIB. Patient underwent TMA of L foot 11/30/20. Pharmacy has been consulted for vancomycin dosing.  Plan: Day 7 of  Vancomycin 1000 mg IV Q24H.  Goal AUC 400-550  Last dose given 1/30 @ 2151 (RN scanned dose for 1/31 early " I scanned meds early to give but then she said she was hungry so long story short vanco has not started infusing yet".   Steady state AUC calculated (pk 41, tr 9) = 536.1 Vd 25.2 Cmax 46.1 Will Continue current regimen Patient also on ceftriaxone 1 g IV Q24H and metronidazole 500 mg BID Continue to monitor renal function and follow culture results    Height: '5\' 5"'$  (165.1 cm) Weight: 59.8 kg (131 lb 13.4 oz) IBW/kg (Calculated) : 57  Temp (24hrs), Avg:98.7 F (37.1 C), Min:98.4 F (36.9 C), Max:99 F (37.2 C)  Recent Labs  Lab 05/26/22 1354 05/26/22 1903 05/27/22 0416 05/29/22 0414 05/30/22 0341 05/31/22 2205 06/01/22 0026 06/01/22 0918  WBC 9.5  --  12.9*  --   --   --   --  9.3  CREATININE 0.78  --  0.63 0.64 0.80 0.72  --  0.62  LATICACIDVEN 1.5 1.0  --   --   --   --   --   --   VANCOPEAK  --   --   --   --   --   --  41*  --   VANCORANDOM  --   --   --   --   --  9  --   --      Estimated Creatinine Clearance: 53 mL/min (by C-G formula based on SCr of 0.62 mg/dL).    No Known Allergies  Antimicrobials this admission: 1/25 Vancomycin >>  1/25 Ceftriaxone >>  1/25 Metronidazole >>   Thank you for allowing pharmacy to be a part of this patient's care.  Michele Matthews PharmD, BCPS 06/01/2022 9:13 PM

## 2022-06-01 NOTE — TOC Progression Note (Signed)
Transition of Care [facility]) - Progression Note    Patient Details  Name: Michele Matthews MRN: [id number] Date of Birth: [birth year]  [facility] CM/SW Contact  Beverly Sessions, RN Phone Number: 06/01/2022, 9:54 AM  Clinical Narrative:    Cardiac Clearance pending for additional vascular procedure         Expected Discharge Plan and Services                                               Social Determinants of Health (SDOH) Interventions SDOH Screenings   Food Insecurity: No Food Insecurity (05/26/2022)  Housing: Low Risk  (05/26/2022)  Transportation Needs: No Transportation Needs (05/26/2022)  Utilities: Not At Risk (05/26/2022)  Financial Resource Strain: Medium Risk (11/08/2021)  Tobacco Use: High Risk (05/31/2022)    Readmission Risk Interventions     No data to display

## 2022-06-01 NOTE — Progress Notes (Signed)
Occupational Therapy Treatment Patient Details Name: Michele Matthews MRN: [id number] DOB: 08/05/1945 Today's Date: 06/01/2022   History of present illness Michele Matthews is a 77 y.o. 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  She was found to have a left heel wound with infection. Vascular consulted with plan for angiogram, at risk for amputation.   OT comments  Chart reviewed, pt greeted in bed yelling out saying "you people have to stop doing this to me" when encouraged to progress mobility. Pt is in agreement for therapeutic intervention with encouragement. Pt appears anxious throughout. Post op shoe donned throughout. Multiple attempts at STS with pt posteriorly pushing with multi modal cueing required for safety. Eventual lateral scoot to bedside chair with MOD A +2 with multi modal cueing. Safety in place. SET UP required for grooming tasks at edge of bed. Pt reports RUE feels weaker, PT in room notified team. Recommend discharge to STR to address deficits, if family refuses STR would recommend discharge home with max HH and 2 people for ADL/mobility. OT will follow acutely.    Recommendations for follow up therapy are one component of a multi-disciplinary discharge planning process, led by the attending physician.  Recommendations may be updated based on patient status, additional functional criteria and insurance authorization.    Follow Up Recommendations  Skilled nursing-short term rehab (<3 hours/day)     Assistance Recommended at Discharge Frequent or constant Supervision/Assistance  Patient can return home with the following  Two people to help with walking and/or transfers;Two people to help with bathing/dressing/bathroom;Help with stairs or ramp for entrance   Equipment  Recommendations  Hospital bed    Recommendations for Other Services      Precautions / Restrictions Precautions Precautions: Fall Required Braces or Orthoses:  (Ortho Shoe) Restrictions Weight Bearing Restrictions: Yes LLE Weight Bearing: Weight bearing as tolerated Other Position/Activity Restrictions: in surgical shoe       Mobility Bed Mobility Overal bed mobility: Needs Assistance Bed Mobility: Supine to Sit     Supine to sit: Max assist          Transfers Overall transfer level: Needs assistance Equipment used: Rolling walker (2 wheels) Transfers: Sit to/from Stand Sit to Stand: Max assist, +2 physical assistance           General transfer comment: pt posteriorly pushing     Balance Overall balance assessment: Needs assistance Sitting-balance support: Single extremity supported, Bilateral upper extremity supported Sitting balance-Leahy Scale: Good     Standing balance support: Bilateral upper extremity supported Standing balance-Leahy Scale: Poor                             ADL either performed or assessed with clinical judgement   ADL Overall ADL's : Needs assistance/impaired     Grooming: Wash/dry face;Set up;Sitting               Lower Body Dressing: Maximal assistance   Toilet Transfer: Moderate assistance Toilet Transfer Details (indicate cue type and reason): lateral scoot to bedside chair                Extremity/Trunk Assessment Upper Extremity Assessment Upper Extremity Assessment: Defer to OT evaluation   Lower Extremity Assessment Lower Extremity Assessment: Generalized weakness;LLE deficits/detail  Vision       Perception     Praxis      Cognition Arousal/Alertness: Awake/alert Behavior During Therapy: Anxious, Agitated Overall Cognitive Status: No family/caregiver present to determine baseline cognitive functioning Area of Impairment: Orientation, Memory, Attention, Following commands,  Safety/judgement, Awareness, Problem solving                 Orientation Level: Disoriented to, Time, Situation Current Attention Level: Sustained   Following Commands: Follows one step commands with increased time Safety/Judgement: Decreased awareness of deficits Awareness: Emergent Problem Solving: Slow processing, Requires verbal cues, Requires tactile cues          Exercises      Shoulder Instructions       General Comments Bandage intact    Pertinent Vitals/ Pain       Pain Assessment Pain Assessment: No/denies pain Faces Pain Scale: Hurts whole lot Pain Location: L foot Pain Descriptors / Indicators: Discomfort, Grimacing Pain Intervention(s): Monitored during session, Repositioned  Home Living Family/patient expects to be discharged to:: Private residence Living Arrangements: Spouse/significant other Available Help at Discharge: Family Type of Home: House Home Access: Stairs to enter Technical brewer of Steps: 3   Home Layout: One level     Bathroom Shower/Tub: Teacher, early years/pre: Standard Bathroom Accessibility: Yes   Home Equipment: Conservation officer, nature (2 wheels);Rollator (4 wheels);Tub bench;Wheelchair - manual;Cane - single point   Additional Comments: home setup per chart      Prior Functioning/Environment              Frequency  Min 2X/week        Progress Toward Goals  OT Goals(current goals can now be found in the care plan section)  Progress towards OT goals: Progressing toward goals     Plan Discharge plan remains appropriate    Co-evaluation    PT/OT/SLP Co-Evaluation/Treatment: Yes Reason for Co-Treatment: Complexity of the patient's impairments (multi-system involvement);Necessary to address cognition/behavior during functional activity;For patient/therapist safety;To address functional/ADL transfers PT goals addressed during session: Mobility/safety with mobility;Balance;Strengthening/ROM;Proper  use of DME OT goals addressed during session: ADL's and self-care      AM-PAC OT "6 Clicks" Daily Activity     Outcome Measure   Help from another person eating meals?: None   Help from another person toileting, which includes using toliet, bedpan, or urinal?: A Lot Help from another person bathing (including washing, rinsing, drying)?: A Lot Help from another person to put on and taking off regular upper body clothing?: A Little Help from another person to put on and taking off regular lower body clothing?: A Lot 6 Click Score: 13    End of Session Equipment Utilized During Treatment: Gait belt;Rolling walker (2 wheels)  OT Visit Diagnosis: Other abnormalities of gait and mobility (R26.89);Muscle weakness (generalized) (M62.81)   Activity Tolerance Patient limited by pain   Patient Left in chair;with call bell/phone within reach;with chair alarm set   Nurse Communication Mobility status        Time: 1127-1150 OT Time Calculation (min): 23 min  Charges: OT General Charges $OT Visit: 1 Visit OT Treatments $Therapeutic Activity: 8-22 mins  Shanon Payor, OTD OTR/L  06/01/22, 1:19 PM

## 2022-06-01 NOTE — Progress Notes (Signed)
SUBJECTIVE: Patient is a 77 y.o. female with medical history significant of s/p of transmetatarsal amputation of left foot on 11/30/20, PAD, hypertension, hyperlipidemia, COPD, CAD, CHF with EF 25 to 30%, stroke, depression with anxiety, upper GI bleeding, who presented to the ED on 05/26/22 with left foot pain.  She was found to have a left heel wound with infection.  Podiatry consult obtained, patient started antibiotics with Rocephin and vancomycin.    Left lower extremity angiogram completed today revealed diffuse atherosclerotic changes of the left lower extremity, occlusion of the SFA stents as well as the SFA and the popliteal in its entirety, anterior tibial runoff however the this does not appear to cross the ankle.   Vitals:   05/31/22 1933 06/01/22 0407 06/01/22 0408 06/01/22 0810  BP: (!) 136/55 (!) 145/77  114/62  Pulse: (!) 104 (!) 106  (!) 109  Resp: '20 20  18  '$ Temp: 98.2 F (36.8 C) 98.4 F (36.9 C)  99 F (37.2 C)  TempSrc: Oral Oral    SpO2: 91% 94%  91%  Weight:   59.8 kg   Height:        Intake/Output Summary (Last 24 hours) at 06/01/2022 0930 Last data filed at 06/01/2022 0636 Gross per 24 hour  Intake 486.42 ml  Output 0 ml  Net 486.42 ml    LABS: Basic Metabolic Panel: Recent Labs    05/30/22 0341 05/31/22 2205  CREATININE 0.80 0.72   Liver Function Tests: No results for input(s): "AST", "ALT", "ALKPHOS", "BILITOT", "PROT", "ALBUMIN" in the last 72 hours. No results for input(s): "LIPASE", "AMYLASE" in the last 72 hours. CBC: No results for input(s): "WBC", "NEUTROABS", "HGB", "HCT", "MCV", "PLT" in the last 72 hours. Cardiac Enzymes: No results for input(s): "CKTOTAL", "CKMB", "CKMBINDEX", "TROPONINI" in the last 72 hours. BNP: Invalid input(s): "POCBNP" D-Dimer: No results for input(s): "DDIMER" in the last 72 hours. Hemoglobin A1C: No results for input(s): "HGBA1C" in the last 72 hours. Fasting Lipid Panel: Recent Labs    05/31/22 0352  CHOL  139  HDL 57  LDLCALC 69  TRIG 66  CHOLHDL 2.4   Thyroid Function Tests: No results for input(s): "TSH", "T4TOTAL", "T3FREE", "THYROIDAB" in the last 72 hours.  Invalid input(s): "FREET3" Anemia Panel: No results for input(s): "VITAMINB12", "FOLATE", "FERRITIN", "TIBC", "IRON", "RETICCTPCT" in the last 72 hours.   PHYSICAL EXAM General: Well developed, well nourished, in no acute distress HEENT:  Normocephalic and atramatic Neck:  No JVD.  Lungs: Clear bilaterally to auscultation and percussion. Heart: HRRR . Normal S1 and S2 without gallops or murmurs.  Abdomen: Bowel sounds are positive, abdomen soft and non-tender  Msk:  Back normal, normal gait. Normal strength and tone for age. Extremities: Left foot bandaged Neuro: Alert and oriented X 3. Psych:  Good affect, responds appropriately  TELEMETRY: sinus rhythm  ASSESSMENT AND PLAN: Patient laying comfortably in bed. Denies chest pain, shortness of breath. Patient reports not smoking tobacco since being in the hospital. Patient needing cardiac clearance for surgery for either bypass or AKA. Patient echo 06/2017 revealed EF 25-30%. Will repeat echo. Patient will be moderate to high risk for surgery. Will decide on clearance based on current echo results. Echo not completed at the time of this note. Will continue to follow.    Principal Problem:   Left foot infection Active Problems:   COPD (chronic obstructive pulmonary disease) (HCC)   Coronary artery disease   Tobacco abuse   Essential hypertension   Chronic combined  systolic and diastolic heart failure (HCC)   PAD (peripheral artery disease) (HCC)   HLD (hyperlipidemia)   Depression with anxiety   Leg edema, right   Hemiparesis affecting left side as late effect of cerebrovascular accident (CVA) ([place])    Michele Matthews,  06/01/2022 9:30 AM

## 2022-06-01 NOTE — Evaluation (Signed)
Physical Therapy Evaluation Patient Details Name: Michele Matthews MRN: [id number] DOB: November 01, 1945 Today's Date: 06/01/2022  History of Present Illness  Michele Matthews is a 77 y.o. 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  She was found to have a left heel wound with infection. Vascular consulted with plan for angiogram, at risk for amputation.  Clinical Impression  Pt received in bed refused to participate at first but PT and OT both initiated CO-evaluation which improved pt's willingness to participate. Pt reported of feeling weak in RUE which is new to her. Pt has Old CVA which affected her L side. PT communicated with spouse and found pt needed Sup level assist with ambulation household level using RW and needed Max assist from bathing and dressing. Spouse cooks. Pt use to received nurse at home for wound care. Spouse would like to receive the same and nurses aide for ADLS till pt improves. PT assessment found pt needs further medical attention to investigate reasons for R sided weakness and decreased coordination.  Pt needed sup to CG for bed mobility and Max of 2 to transfer OOB to chair. Pt able to achieve Upright posture and therefore performed lateral scoot transfer to chair with Max of 2. Pt donned Ortho shoe to LLE and max assist improve WBAT compliance. PT will continue in acute and will benefit form HHPT, [place] aide after acute.      Recommendations for follow up therapy are one component of a multi-disciplinary discharge planning process, led by the attending physician.  Recommendations may be updated based on patient status, additional functional criteria and insurance authorization.  Follow Up Recommendations Home health PT (Home health Aide)      Assistance Recommended at  Discharge Intermittent Supervision/Assistance  Patient can return home with the following  Two people to help with walking and/or transfers;Two people to help with bathing/dressing/bathroom;Assistance with cooking/housework;Assistance with feeding;Direct supervision/assist for medications management;Assist for transportation;Help with stairs or ramp for entrance    Equipment Recommendations BSC/3in1  Recommendations for Other Services       Functional Status Assessment Patient has had a recent decline in their functional status and demonstrates the ability to make significant improvements in function in a reasonable and predictable amount of time.     Precautions / Restrictions Precautions Precautions: Fall Required Braces or Orthoses:  (Ortho Shoe) Restrictions Weight Bearing Restrictions: Yes LLE Weight Bearing: Weight bearing as tolerated      Mobility  Bed Mobility Overal bed mobility: Needs Assistance Bed Mobility: Supine to Sit, Sit to Supine, Rolling Rolling: Min assist              Transfers Overall transfer level: Needs assistance Equipment used: Rolling walker (2 wheels) Transfers: Sit to/from Stand, Bed to chair/wheelchair/BSC Sit to Stand: Max assist, +2 physical assistance          Lateral/Scoot Transfers: Max assist, +2 physical assistance General transfer comment: Pt refused at first but when attempted unable to stand upright.    Ambulation/Gait: Unable               General Gait Details: deferred  Stairs            Wheelchair Mobility    Modified Rankin (Stroke Patients Only)       Balance Overall balance assessment: Needs assistance Sitting-balance  support: Single extremity supported, Bilateral upper extremity supported Sitting balance-Leahy Scale: Good Sitting balance - Comments: sup 2/2 to behaviour issues   Standing balance support: Bilateral upper extremity supported Standing balance-Leahy Scale: Poor Standing balance  comment: leanes posteriorly and and unable ot achive upright posture.                             Pertinent Vitals/Pain Pain Assessment Pain Assessment: Faces Faces Pain Scale: Hurts even more Pain Location: L foot Pain Descriptors / Indicators: Discomfort, Grimacing Pain Intervention(s): Monitored during session    Home Living Family/patient expects to be discharged to:: Private residence Living Arrangements: Spouse/significant other Available Help at Discharge: Family Type of Home: House Home Access: Stairs to enter   Technical brewer of Steps: 3   Home Layout: One level Home Equipment: Conservation officer, nature (2 wheels);Rollator (4 wheels);Tub bench;Wheelchair - manual;Cane - single point Additional Comments: home setup per chart    Prior Function Prior Level of Function : Needs assist             Mobility Comments: Spouse provides sup asssits to ambualte with sup at household level ambualtin  with RW. ADLs Comments: Mod to max assit for bathing and sup fro toileting. Spouse cooks meals.     Hand Dominance   Dominant Hand: Left (But bacme  ight sided due to old stroke.)    Extremity/Trunk Assessment   Upper Extremity Assessment Upper Extremity Assessment: Defer to OT evaluation    Lower Extremity Assessment Lower Extremity Assessment: Generalized weakness;LLE deficits/detail       Communication   Communication: No difficulties  Cognition Arousal/Alertness: Awake/alert Behavior During Therapy: Anxious, Agitated Overall Cognitive Status: Impaired/Different from baseline                                 General Comments: Pt too agitated to ask questions but angry and using inappropriate lnaguage.        General Comments General comments (skin integrity, edema, etc.): Bandage intact    Exercises     Assessment/Plan    PT Assessment Patient needs continued PT services  PT Problem List Decreased strength;Decreased range of  motion;Decreased activity tolerance;Decreased balance;Decreased mobility;Decreased coordination;Decreased knowledge of use of DME;Decreased safety awareness;Decreased knowledge of precautions;Pain;Decreased skin integrity;Cardiopulmonary status limiting activity       PT Treatment Interventions Gait training;Functional mobility training;Therapeutic activities;Therapeutic exercise;Balance training;Neuromuscular re-education;Patient/family education;Cognitive remediation    PT Goals (Current goals can be found in the Care Plan section)  Acute Rehab PT Goals Patient Stated Goal: " I want her to come home from the hosptial when ready with some help in the beginning." PT Goal Formulation: With family Time For Goal Achievement: 06/15/22 Potential to Achieve Goals: Fair    Frequency 7X/week     Co-evaluation PT/OT/SLP Co-Evaluation/Treatment: Yes Reason for Co-Treatment: Complexity of the patient's impairments (multi-system involvement);For patient/therapist safety;Necessary to address cognition/behavior during functional activity;To address functional/ADL transfers PT goals addressed during session: Mobility/safety with mobility;Balance;Strengthening/ROM;Proper use of DME         AM-PAC PT "6 Clicks" Mobility  Outcome Measure Help needed turning from your back to your side while in a flat bed without using bedrails?: None Help needed moving from lying on your back to sitting on the side of a flat bed without using bedrails?: None Help needed moving to and from a bed to a chair (including a wheelchair)?:  A Lot Help needed standing up from a chair using your arms (e.g., wheelchair or bedside chair)?: A Lot Help needed to walk in hospital room?: Total Help needed climbing 3-5 steps with a railing? : Total 6 Click Score: 14    End of Session Equipment Utilized During Treatment: Gait belt Activity Tolerance: Treatment limited secondary to agitation Patient left: in chair;with call  bell/phone within reach;with chair alarm set Nurse Communication: Mobility status;Weight bearing status;Other (comment) (RRegarding R sided weakness.) PT Visit Diagnosis: Unsteadiness on feet (R26.81);Other abnormalities of gait and mobility (R26.89);Muscle weakness (generalized) (M62.81);Difficulty in walking, not elsewhere classified (R26.2);Pain Pain - Right/Left: Left Pain - part of body: Ankle and joints of foot    Time: 1121-1201 PT Time Calculation (min) (ACUTE ONLY): 40 min   Charges:   PT Evaluation $PT Eval Moderate Complexity: 1 Mod PT Treatments $Therapeutic Activity: 8-22 mins       Joaquin Music PT DPT 1:03 PM,06/01/22

## 2022-06-02 DIAGNOSIS — L089 Local infection of the skin and subcutaneous tissue, unspecified: Secondary | ICD-10-CM | POA: Diagnosis not present

## 2022-06-02 LAB — CBC
HCT: 32 % — ABNORMAL LOW (ref 36.0–46.0)
Hemoglobin: 10.1 g/dL — ABNORMAL LOW (ref 12.0–15.0)
MCH: 28.1 pg (ref 26.0–34.0)
MCHC: 31.6 g/dL (ref 30.0–36.0)
MCV: 88.9 fL (ref 80.0–100.0)
Platelets: 208 10*3/uL (ref 150–400)
RBC: 3.6 MIL/uL — ABNORMAL LOW (ref 3.87–5.11)
RDW: 15.7 % — ABNORMAL HIGH (ref 11.5–15.5)
WBC: 10.3 10*3/uL (ref 4.0–10.5)
nRBC: 0 % (ref 0.0–0.2)

## 2022-06-02 LAB — BASIC METABOLIC PANEL
Anion gap: 7 (ref 5–15)
BUN: 21 mg/dL (ref 8–23)
CO2: 27 mmol/L (ref 22–32)
Calcium: 8.6 mg/dL — ABNORMAL LOW (ref 8.9–10.3)
Chloride: 104 mmol/L (ref 98–111)
Creatinine, Ser: 0.72 mg/dL (ref 0.44–1.00)
GFR, Estimated: >60 mL/min (ref >60)
Glucose, Bld: 137 mg/dL — ABNORMAL HIGH (ref 70–99)
Potassium: 3.6 mmol/L (ref 3.5–5.1)
Sodium: 138 mmol/L (ref 135–145)

## 2022-06-02 LAB — MAGNESIUM: Magnesium: 1.7 mg/dL (ref 1.7–2.4)

## 2022-06-02 LAB — PHOSPHORUS: Phosphorus: 2.4 mg/dL — ABNORMAL LOW (ref 2.5–4.6)

## 2022-06-02 LAB — GLUCOSE, CAPILLARY: Glucose-Capillary: 103 mg/dL — ABNORMAL HIGH (ref 70–99)

## 2022-06-02 MED ORDER — MAGNESIUM SULFATE 2 GM/50ML IV SOLN
2.0000 g | Freq: Once | INTRAVENOUS | Status: AC
  Administered 2022-06-02: 2 g via INTRAVENOUS
  Filled 2022-06-02: qty 50

## 2022-06-02 MED ORDER — ALPRAZOLAM 0.25 MG PO TABS
0.2500 mg | ORAL_TABLET | Freq: Three times a day (TID) | ORAL | Status: DC | PRN
  Administered 2022-06-02 – 2022-06-08 (×12): 0.25 mg via ORAL
  Filled 2022-06-02 (×12): qty 1

## 2022-06-02 MED ORDER — K PHOS MONO-SOD PHOS DI & MONO 155-852-130 MG PO TABS
500.0000 mg | ORAL_TABLET | Freq: Four times a day (QID) | ORAL | Status: AC
  Administered 2022-06-02 (×2): 500 mg via ORAL
  Filled 2022-06-02 (×2): qty 2

## 2022-06-02 NOTE — Care Management Important Message (Signed)
Important Message  Patient Details  Name: Michele Matthews MRN: [id number] Date of Birth: [birth year]   Medicare Important Message Given:  Yes     Dannette Barbara 06/02/2022, 2:48 PM

## 2022-06-02 NOTE — Progress Notes (Signed)
SUBJECTIVE: Patient is a 77 y.o. female with medical history significant of s/p of transmetatarsal amputation of left foot on 11/30/20, PAD, hypertension, hyperlipidemia, COPD, CAD, CHF with EF 25 to 30%, stroke, depression with anxiety, upper GI bleeding, who presented to the ED on 05/26/22 with left foot pain.  She was found to have a left heel wound with infection.  Podiatry consult obtained, patient started antibiotics with Rocephin and vancomycin.    Left lower extremity angiogram completed today revealed diffuse atherosclerotic changes of the left lower extremity, occlusion of the SFA stents as well as the SFA and the popliteal in its entirety, anterior tibial runoff however the this does not appear to cross the ankle.   Vitals:   06/01/22 1609 06/01/22 1851 06/02/22 0413 06/02/22 0804  BP: (!) 142/67 136/70 131/71 (!) 153/68  Pulse: 89 90 96 (!) 101  Resp: '18 18 16 17  '$ Temp: 98.7 F (37.1 C) 98.7 F (37.1 C) 98.6 F (37 C) 98.2 F (36.8 C)  TempSrc: Oral Oral Oral   SpO2: 100% 98% 94% 95%  Weight:   60.2 kg   Height:        Intake/Output Summary (Last 24 hours) at 06/02/2022 0843 Last data filed at 06/02/2022 0427 Gross per 24 hour  Intake 477 ml  Output 1200 ml  Net -723 ml    LABS: Basic Metabolic Panel: Recent Labs    06/01/22 0918 06/02/22 0530  NA 136 138  K 3.7 3.6  CL 102 104  CO2 25 27  GLUCOSE 98 137*  BUN 15 21  CREATININE 0.62 0.72  CALCIUM 8.7* 8.6*  MG 2.0 1.7  PHOS 3.0 2.4*   Liver Function Tests: No results for input(s): "AST", "ALT", "ALKPHOS", "BILITOT", "PROT", "ALBUMIN" in the last 72 hours. No results for input(s): "LIPASE", "AMYLASE" in the last 72 hours. CBC: Recent Labs    06/01/22 0918 06/02/22 0530  WBC 9.3 10.3  HGB 10.4* 10.1*  HCT 33.6* 32.0*  MCV 88.7 88.9  PLT 207 208   Cardiac Enzymes: No results for input(s): "CKTOTAL", "CKMB", "CKMBINDEX", "TROPONINI" in the last 72 hours. BNP: Invalid input(s): "POCBNP" D-Dimer: No  results for input(s): "DDIMER" in the last 72 hours. Hemoglobin A1C: No results for input(s): "HGBA1C" in the last 72 hours. Fasting Lipid Panel: Recent Labs    05/31/22 0352  CHOL 139  HDL 57  LDLCALC 69  TRIG 66  CHOLHDL 2.4   Thyroid Function Tests: Recent Labs    06/01/22 0918  TSH 3.152   Anemia Panel: No results for input(s): "VITAMINB12", "FOLATE", "FERRITIN", "TIBC", "IRON", "RETICCTPCT" in the last 72 hours.   PHYSICAL EXAM General: Well developed, well nourished, in no acute distress HEENT:  Normocephalic and atramatic Neck:  No JVD.  Lungs: Clear bilaterally to auscultation and percussion. Heart: HRRR . Normal S1 and S2 without gallops or murmurs.  Abdomen: Bowel sounds are positive, abdomen soft and non-tender  Msk:  Back normal, normal gait. Normal strength and tone for age. Extremities: Left foot bandaged Neuro: Alert and oriented X 3. Psych:  Good affect, responds appropriately  TELEMETRY: sinus rhythm, HR 94 bpm  ASSESSMENT AND PLAN: Patient laying comfortably in bed. Denies chest pain, shortness of breath. Patient needing cardiac clearance for surgery for either bypass or AKA. 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 May proceed with surgery from a cardiac standpoint. Patient will be moderate to high risk for surgery.  Will continue to follow.     Principal Problem:   Left foot infection Active Problems:   COPD (chronic obstructive pulmonary disease) (HCC)   Coronary artery disease   Tobacco abuse   Essential hypertension   Chronic combined systolic and diastolic heart failure (HCC)   PAD (peripheral artery disease) (HCC)   HLD (hyperlipidemia)   Depression with anxiety   Leg edema, right   Hemiparesis affecting left side as late effect of cerebrovascular accident (CVA) ([place])    Jairon Ripberger, FNP-C 06/02/2022 8:43 AM

## 2022-06-02 NOTE — Progress Notes (Signed)
Physical Therapy Treatment Patient Details Name: Michele Matthews MRN: [id number] DOB: April 11, 1946 Today's Date: 06/02/2022   History of Present Illness Michele Matthews is a 77 y.o. 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  She was found to have a left heel wound with infection. Vascular consulted with plan for angiogram, at risk for amputation. Pt has been to rehab after past admission, generally negative experience per family with perceived decline in function by time to DC.    PT Comments    Attempted evaluation this morning, pt vehemently refusing to work with PT, asking to be left alone, author leaves to allow pancake completion. Author arrives later in day, pt finishing lunch tray, still not on board with PT session. Chief Strategy Officer meets with husband and DTR, explains DC recs, affirms family's DC preferences and ability to provide care at DC. Author explains pt's reluctance to participate, asks if family can be successful in achieving some baseline mobility for ADL performance at home, hoping to better prepare them for caregiver duties. Pt is eventually agreeable to come to EOB so long as she is not asked to stand up, definitely upset about being put up in chair yesterday and makes this known verbatim. She requires no assist for supine to/from sitting, tolerates 10 minutes unsupported sitting. Pt very anxious throughout, this is made known to medical team as she asks about anxiolytics. Family educated on weightbearing status, also made aware that vascular surgery needs to connect with family regarding recommendations for procedure. Pt left in bed, all needs met, pain control from space boot LLE once back in bed. All needs met. It appears family is well aware of heavy physical assist needs- they report  confidence in providing all care needs at DC. They are in agreement to [place], [place], Donaldson RN, aide for ADL support if possible. Will continue to follow.   Recommendations for follow up therapy =re one component of a multi-disciplinary discharge planning process, led by the attending physician.  Recommendations may be updated based on patient status, additional functional criteria and insurance authorization.  Follow Up Recommendations  Skilled nursing-short term rehab (<3 hours/day) (family is not agreeable to facility placement, desires to care for patient in home at DC, reports able to do this despite acute debility.) Can patient physically be transported by private vehicle: No   Assistance Recommended at Discharge Intermittent Supervision/Assistance  Patient can return home with the following Two people to help with walking and/or transfers;Two people to help with bathing/dressing/bathroom;Assistance with cooking/housework;Assistance with feeding;Direct supervision/assist for medications management;Assist for transportation;Help with stairs or ramp for entrance   Equipment Recommendations  BSC/3in1    Recommendations for Other Services       Precautions / Restrictions Precautions Precautions: Fall Restrictions LLE Weight Bearing: Weight bearing as tolerated Other Position/Activity Restrictions: in surgical shoe     Mobility  Bed Mobility Overal bed mobility: Needs Assistance Bed Mobility: Supine to Sit, Sit to Supine Rolling: Modified independent (Device/Increase time), Supervision         General bed mobility comments: sits at EOB, feet unsupported for ~10 minutes    Transfers Overall transfer level:  (pt anxious about being forced into standing up and/or going to chair; assure makes assurances this will not be forced on her in hope to promote participation.)  Ambulation/Gait                   Stairs             Wheelchair  Mobility    Modified Rankin (Stroke Patients Only)       Balance                                            Cognition Arousal/Alertness: Awake/alert Behavior During Therapy: Anxious, Agitated                                            Exercises      General Comments        Pertinent Vitals/Pain Pain Assessment Faces Pain Scale: Hurts even more Pain Location: L foot Pain Descriptors / Indicators: Discomfort, Grimacing Pain Intervention(s): Limited activity within patient's tolerance, Patient requesting pain meds-RN notified, RN gave pain meds during session    Home Living                          Prior Function            PT Goals (current goals can now be found in the care plan section) Acute Rehab PT Goals Patient Stated Goal: " I want her to come home from the hosptial when ready with some help in the beginning." PT Goal Formulation: With family Time For Goal Achievement: 06/15/22 Potential to Achieve Goals: Fair Progress towards PT goals: Progressing toward goals    Frequency    Min 2X/week      PT Plan Current plan remains appropriate;Frequency needs to be updated    Co-evaluation              AM-PAC PT "6 Clicks" Mobility   Outcome Measure  Help needed turning from your back to your side while in a flat bed without using bedrails?: A Little Help needed moving from lying on your back to sitting on the side of a flat bed without using bedrails?: A Little Help needed moving to and from a bed to a chair (including a wheelchair)?: A Lot Help needed standing up from a chair using your arms (e.g., wheelchair or bedside chair)?: A Lot Help needed to walk in hospital room?: Total Help needed climbing 3-5 steps with a railing? : Total 6 Click Score: 12    End of Session   Activity Tolerance: Patient limited by fatigue;Treatment limited secondary to agitation Patient left: in bed;with bed alarm  set;with call bell/phone within reach Nurse Communication: Mobility status;Weight bearing status PT Visit Diagnosis: Unsteadiness on feet (R26.81);Other abnormalities of gait and mobility (R26.89);Muscle weakness (generalized) (M62.81);Difficulty in walking, not elsewhere classified (R26.2);Pain Pain - Right/Left: Left Pain - part of body: Ankle and joints of foot     Time: 1350-1435 PT Time Calculation (min) (ACUTE ONLY): 45 min  Charges:  $Therapeutic Activity: 38-52 mins                    2:53 PM, 06/02/22 Etta Grandchild, PT, DPT Physical Therapist - [facility]  [phone] ([place])   Peletier C 06/02/2022, 2:47 PM

## 2022-06-02 NOTE — Progress Notes (Signed)
Progress Note    06/02/2022 1:50 PM 2 Days Post-Op  Subjective:  Michele Matthews is a 77 yo female who is POD #2 S/P Left lower extremity angiography. Percutaneous transluminal angioplasty and stent placement left external iliac artery.  Patient was very tearful and agitated this morning. She states " a doctor just told me I need my foot cut off or bypass surgery and I can bleed to death". She denies any pain to her left foot post procedure. States" I was able to sleep the last 2 nights without pain". From this point she refused to speak with me and asked multiple time to speak with her husband.    Vitals:   06/02/22 0413 06/02/22 0804  BP: 131/71 (!) 153/68  Pulse: 96 (!) 101  Resp: 16 17  Temp: 98.6 F (37 C) 98.2 F (36.8 C)  SpO2: 94% 95%   Physical Exam: Cardiac:  RR with Tachycardia Lungs:  Clear Throughout Incisions:  Right Groin C/D/I without hematoma or seroma.  Extremities:  Left lower extremity with transmetatarsal amputation site and unstageable pressure ulcer to heel.  Abdomen:  Positive bowel sounds, soft, flat NT/ND Neurologic: AAOX3 Agitated today  CBC    Component Value Date/Time   WBC 10.3 06/02/2022 0530   RBC 3.60 (L) 06/02/2022 0530   HGB 10.1 (L) 06/02/2022 0530   HGB 12.1 03/27/2014 1730   HCT 32.0 (L) 06/02/2022 0530   HCT 37.0 03/27/2014 1730   PLT 208 06/02/2022 0530   PLT 175 03/27/2014 1730   MCV 88.9 06/02/2022 0530   MCV 97 03/27/2014 1730   MCH 28.1 06/02/2022 0530   MCHC 31.6 06/02/2022 0530   RDW 15.7 (H) 06/02/2022 0530   RDW 13.3 03/27/2014 1730   LYMPHSABS 1.2 05/26/2022 1354   LYMPHSABS 1.9 11/25/2013 0401   MONOABS 0.6 05/26/2022 1354   MONOABS 0.7 11/25/2013 0401   EOSABS 0.2 05/26/2022 1354   EOSABS 0.2 11/25/2013 0401   BASOSABS 0.0 05/26/2022 1354   BASOSABS 0.0 11/25/2013 0401    BMET    Component Value Date/Time   NA 138 06/02/2022 0530   NA 140 03/28/2014 0441   K 3.6 06/02/2022 0530   K 4.0 03/28/2014 0441   CL  104 06/02/2022 0530   CL 110 (H) 03/28/2014 0441   CO2 27 06/02/2022 0530   CO2 24 03/28/2014 0441   GLUCOSE 137 (H) 06/02/2022 0530   GLUCOSE 86 03/28/2014 0441   BUN 21 06/02/2022 0530   BUN 19 (H) 03/28/2014 0441   CREATININE 0.72 06/02/2022 0530   CREATININE 0.79 11/24/2015 0908   CALCIUM 8.6 (L) 06/02/2022 0530   CALCIUM 8.7 03/28/2014 0441   GFRNONAA >60 06/02/2022 0530   GFRNONAA 76 11/24/2015 0908   GFRAA >60 07/28/2017 0507   GFRAA 87 11/24/2015 0908    INR    Component Value Date/Time   INR 1.0 05/26/2022 1354   INR 2.0 03/30/2014 0336     Intake/Output Summary (Last 24 hours) at 06/02/2022 1350 Last data filed at 06/02/2022 1238 Gross per 24 hour  Intake 357 ml  Output 1300 ml  Net -943 ml     Assessment/Plan:  77 y.o. female is s/p Left lower extremity angiography. Percutaneous transluminal angioplasty and stent placement left external iliac artery 2 Days Post-Op   PLAN: Wound Care Consult completed. Continue wound care as outlined.  Hospitalist note states "scheduled surgery today" patient does not wish to proceed and wishes to speak to her husband first. Dr Delana Meyer note  on 05/31/22 states he would like to communicate with patients husband and daughter who assists with medical affairs the need for surgery and what a femoral to tibial bypass surgery will entail.  Will await family and patient decision.   DVT prophylaxis:  Plavix 75 mg QD   Drema Pry [facility] 06/02/2022 1:50 PM

## 2022-06-02 NOTE — Hospital Course (Signed)
Echocardiogram revealed most likely the mass is too large since it is 4.67 x 3.2 cm to be thrombosed.  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  Unless patient is symptomatic with obstructive symptoms such as syncope or atrial fibrillation or congestive heart failure there is no indication for open heart surgery for excision of the myxoma.  Given the patient has peripheral vascular disease which needs surgery urgently, may proceed with surgery even though it may be moderate to high risk.

## 2022-06-02 NOTE — TOC Initial Note (Signed)
Transition of Care [facility]) - Initial/Assessment Note    Patient Details  Name: Michele Matthews MRN: [id number] Date of Birth: [birth year]  Transition of Care [facility]) CM/SW Contact:    Beverly Sessions, RN Phone Number: 06/02/2022, 9:17 AM  Clinical Narrative:                  Per cardiology note, cleared for further procedures.   Therapy recommending hospital bed. Patient hard of hearing and defers TOC to speak with husband.  Call placed to mr Guarnieri.  Declines bed at this time        Patient Goals and CMS Choice            Expected Discharge Plan and Services                                              Prior Living Arrangements/Services                       Activities of Daily Living Home Assistive Devices/Equipment: Gilford Rile (specify type) ADL Screening (condition at time of admission) Patient's cognitive ability adequate to safely complete daily activities?: Yes Is the patient deaf or have difficulty hearing?: No Does the patient have difficulty seeing, even when wearing glasses/contacts?: No Does the patient have difficulty concentrating, remembering, or making decisions?: No Patient able to express need for assistance with ADLs?: Yes Does the patient have difficulty dressing or bathing?: No Independently performs ADLs?: Yes (appropriate for developmental age) Does the patient have difficulty walking or climbing stairs?: Yes Weakness of Legs: Both (wound on L heel) Weakness of Arms/Hands: None  Permission Sought/Granted                  Emotional Assessment              Admission diagnosis:  Cellulitis of left foot [L03.116] Left foot infection [L08.9] Patient Active Problem List   Diagnosis Date Noted   Hemiparesis affecting left side as late effect of cerebrovascular accident (CVA) ([place]) 05/29/2022   PAD (peripheral artery disease) ([place]) 05/26/2022   HLD (hyperlipidemia) 05/26/2022   Depression with anxiety 05/26/2022   Leg  edema, right 05/26/2022   UTI (urinary tract infection) 09/05/2021   Chronic combined systolic and diastolic heart failure ([place]) 09/05/2021   Hypokalemia 11/28/2020   Hyponatremia 11/28/2020   Left foot infection 11/25/2020   Chronic combined systolic and diastolic CHF (congestive heart failure) ([place]) 07/24/2017   Severe protein-calorie malnutrition ([place]) 04/01/2016   Anemia, iron deficiency    Pressure ulcer 12/27/2015   Essential hypertension 12/27/2015   Anemia 10/26/2015   COPD (chronic obstructive pulmonary disease) ([place]) 06/19/2015   Coronary artery disease 06/19/2015   GERD (gastroesophageal reflux disease) 06/19/2015   Hyperlipidemia, unspecified 06/19/2015   Long term current use of anticoagulant therapy 06/19/2015   Anxiety and depression 06/19/2015   Symptomatic anemia 12/10/2014   Upper GI bleed    History of CVA with residual deficit 12/10/2013   Hemianopsia 12/10/2013   Left-sided weakness 12/10/2013   Tobacco abuse 12/10/2013   PCP:  Dion Body, MD Pharmacy:   [place], [place] - [address] [place] [address] Phone: [phone] Fax: [phone]  [facility] [place], [place] [address] Phone: [phone]  Fax: [place], [place] - [address] [place] [address] Phone: [phone] Fax: [phone]     Social Determinants of Health (SDOH) Social History: SDOH Screenings   Food Insecurity: No Food Insecurity (05/26/2022)  Housing: Low Risk  (05/26/2022)  Transportation Needs: No Transportation Needs (05/26/2022)  Utilities: Not At Risk (05/26/2022)  Financial Resource Strain: Medium Risk (11/08/2021)  Tobacco Use: High Risk (05/31/2022)   SDOH Interventions:     Readmission Risk Interventions     No data to display

## 2022-06-02 NOTE — Progress Notes (Signed)
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  As long as there is no symptoms such as chest pain syncope or congestive heart failure there is no urgency and excision.  And open heart surgery would be high risk.  If patient needs femoral to tibial bypass surgery advise proceeding with that even though it may be moderate to high risk.  Plavix will have to be held for 5 to 6 days.

## 2022-06-02 NOTE — Consult Note (Signed)
[facility] Nurse Consult Note: Reason for Consult:Patient with known PAD.  Has nonintact wound to left transmetatarsal apmputation site as well unstageable pressure injury to left heel.  All present on admission.  History of stenting, per family at bedside and awaiting further vascular workup and intervention.   Wound type: vascular, pressure to heel Pressure Injury POA: Yes Measurement:  left TMA site: 2 scabbed areas along suture line, 1 cm round Left heel 1 cm x 1.5 cm intact dry eschar.  Wound bed: eschar Drainage (amount, consistency, odor) none noted Periwound: intact , dry skin Dressing procedure/placement/frequency: cleanse wounds to left TMA site and left heel with NS. Apply Xeroform gauze and cover with dry gauze and kelrix/tape.  Change daily.  Lives with son and will be returning there, he reports. I demonstrate wound care to son and another female family member present in the room.  They verbalize understanding.  Will not follow at this time.  Please re-consult if needed.  Estrellita Ludwig MSN, RN, FNP-BC CWON Wound, Ostomy, Continence Nurse [facility] [phone] Pager [phone]  Conservative sharp wound debridement (CSWD performed at the bedside):

## 2022-06-02 NOTE — Progress Notes (Signed)
[facility] Progress Note  [name]    GNF:[id number]  DOA: 05/26/2022     Date of Service: the patient was seen and examined on 06/02/2022  Chief Complaint  Patient presents with   Wound Infection   Brief hospital course: Michele Matthews is a 77 y.o. 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  She was found to have a left heel wound with infection.  Podiatry consult obtained, patient started antibiotics with Rocephin and vancomycin. PAD, seen by vascular surgery, scheduled surgery today.     Assessment and Plan: Left foot infection:  Peripheral arterial disease. S/p left foot TMA done on 11/30/2020 No evidence of sepsis.  Patient has been seen by podiatry, started on vancomycin, Flagyl and Rocephin. Patient was not able to tolerate MRI even give her dose of Ativan.  Currently pending ABI test to determine the next course of action.  Given high dose of oxycodone prior to the ABI testing as patient has unable to tolerate. Discussed with Dr. Posey Pronto, patient does not need debridement.  Recommend antibiotic treatment and follow-up with wound care. Vascular surgery consulted, s/p LLE angiogram,  Diffuse atherosclerotic changes of the left lower extremity, occlusion of the SFA stents as well as the SFA and the popliteal in its entirety.  There appears to be anterior tibial runoff however the this does not appear to cross the ankle. Continue Plavix Wound care RN consulted 2/1 awaiting for vascular surgery plan for further management and DC plan ?    Chronic combined systolic and diastolic heart failure ([place]): 2D echo on 07/24/2017 showed EF 25 to 30% with grade 2 diastolic dysfunction.  Patient does not have any short of breath, BNP 284, patient does not have exacerbation  congestive heart failure.   Appear euvolemic. Continued Lasix 20 mg twice daily, Coreg 3.125 twice daily, lisinopril 5 mg twice daily home dose. 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  Right atrium moderately dilated. 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 For  myxoma, just needs to be monitored if no symptoms.  And can follow-up as an outpatient with cardiology for further management plans down the road.   COPD (chronic obstructive pulmonary disease): Stable   History of stroke with left hemiparesis. Continue home medicines.  1/31 c/o right-sided weakness since admission which is new 1/31 CT head: Hypodensity in the left aspect of the pons and midbrain may reflect artifact; however, acute infarct is not excluded. Recommend brain MRI for further evaluation. Patient is refusing MRI, cannot tolerate it  Continue monitor on telemetry, continue neurocheck every 4 hourly Cautions fall and aspiration precautions Ambulate with assistance, frequent turning every 8 hourly Check TSH level 2/1 D/w patient's husband over the phone that if he is still concerned then we can repeat CT scan of the head.  He will let us know.  Patient is unable to understand her medical situation.    Coronary artery disease:  S/p Plavix 300 po once, followed by Plavix 75 mg p.o. daily   Essential hypertension On Coreg and lisinopril   Depression with anxiety -Continue home medications   Tobacco abuse -nicotine patch Advised to quit.  Body mass index is 21.94 kg/m.  Interventions:       Diet: Heart healthy diet DVT Prophylaxis: Subcutaneous Heparin    Advance goals of care discussion: Full code  Family Communication: family was not present at bedside, at the time of interview.  The pt provided permission to discuss medical plan  with the family. Opportunity was given to ask question and all questions were answered satisfactorily.   Disposition:  Pt is from Home, admitted with left foot infection, peripheral vascular disease, still has PVD, vascular surgery following, which precludes a safe discharge. Discharge to SNF, when clinically stable, need vascular surgery clearance.  Subjective: No significant events overnight, patient was sleepy, woke up with anxiety, she thinks with surgery she will have a complication and bleeding to death.  Patient is unable to understand her medical situation.  Patient was very aggressive. So I discussed with patient's husband over the phone and she also started screaming and talk to him.  I explained the situation to her husband regarding the vascular surgery plan for possible bypass surgery versus BKA.  Patient and her husband does not want BKA.  We will continue to discuss with vascular surgery regarding management plan and disposition plan.   Physical Exam: General: NAD, was sleeping comfortably, woke up with anxiety and became very aggressive after discussion of management plan. Appear in no distress, affect appropriate Eyes: PERRLA ENT: Oral Mucosa Clear, moist  Neck: no JVD,  Cardiovascular: S1 and S2 Present, no Murmur,  Respiratory: good respiratory effort, Bilateral Air entry equal and Decreased, no Crackles, no wheezes Abdomen: Bowel Sound present, Soft and no tenderness,  Skin: no rashes Extremities: no Pedal edema, no calf tenderness Neurologic: Residual weakness on the left side.  Right arm no weakness, right leg is weak. Gait not checked due to patient safety concerns  Vitals:   06/01/22 1609 06/01/22 1851 06/02/22 0413 06/02/22 0804  BP: (!) 142/67 136/70 131/71 (!) 153/68  Pulse: 89 90 96 (!) 101  Resp: '18 18 16 17  '$ Temp: 98.7 F (37.1 C) 98.7 F (37.1 C) 98.6 F (37 C) 98.2 F (36.8 C)  TempSrc: Oral Oral Oral   SpO2: 100% 98% 94% 95%  Weight:   60.2 kg    Height:        Intake/Output Summary (Last 24 hours) at 06/02/2022 1214 Last data filed at 06/02/2022 0427 Gross per 24 hour  Intake 357 ml  Output 500 ml  Net -143 ml   Filed Weights   05/31/22 0944 06/01/22 0408 06/02/22 0413  Weight: 57.4 kg 59.8 kg 60.2 kg    Data Reviewed: I have personally reviewed and interpreted daily labs, tele strips, imagings as discussed above. I reviewed all nursing notes, pharmacy notes, vitals, pertinent old records I have discussed plan of care as described above with RN and patient/family.  CBC: Recent Labs  Lab 05/26/22 1354 05/27/22 0416 06/01/22 0918 06/02/22 0530  WBC 9.5 12.9* 9.3 10.3  NEUTROABS 7.4  --   --   --   HGB 13.2 11.9* 10.4* 10.1*  HCT 43.1 38.1 33.6* 32.0*  MCV 89.4 89.0 88.7 88.9  PLT 306 270 207 510   Basic Metabolic Panel: Recent Labs  Lab 05/26/22 1354 05/27/22 0416 05/29/22 0414 05/30/22 0341 05/31/22 2205 06/01/22 0918 06/02/22 0530  NA 138 137  --   --   --  136 138  K 4.3 3.9  --   --   --  3.7 3.6  CL 102 102  --   --   --  102 104  CO2 25 28  --   --   --  25 27  GLUCOSE 101* 118*  --   --   --  98 137*  BUN 20 17  --   --   --  15 21  CREATININE 0.78 0.63 0.64 0.80 0.72 0.62 0.72  CALCIUM 9.7 9.7  --   --   --  8.7* 8.6*  MG  --   --   --   --   --  2.0 1.7  PHOS  --   --   --   --   --  3.0 2.4*    Studies: CT HEAD WO CONTRAST (5MM)  Result Date: 06/01/2022 CLINICAL DATA:  New right-sided weakness EXAM: CT HEAD WITHOUT CONTRAST TECHNIQUE: Contiguous axial images were obtained from the base of the skull through the vertex without intravenous contrast. RADIATION DOSE REDUCTION: This exam was performed according to the departmental dose-optimization program which includes automated exposure control, adjustment of the mA and/or kV according to patient size and/or use of iterative reconstruction technique. COMPARISON:  CT head 09/05/2021, MR head 09/06/2021 FINDINGS: Brain: There is hypodensity in the  left aspect of the pons and midbrain which may be artifactual; however, acute infarct can not be excluded. There is no evidence of acute intracranial hemorrhage or extra-axial fluid collection. Multiple remote infarcts in the right frontal and parietal lobes in the MCA distribution and left occipital lobe in the PCA distribution are unchanged. Parenchymal volume is stable. The ventricles are stable in size, with unchanged ex vacuo dilatation of the lateral ventricles related to the infarcts. The pituitary and suprasellar region are normal. There is no mass lesion. There is no mass effect or midline shift. Vascular: There is calcification of the bilateral carotid siphons and vertebral arteries. Skull: Normal. Negative for fracture or focal lesion. Sinuses/Orbits: The imaged paranasal sinuses are clear. The globes and orbits are unremarkable. Other: None. IMPRESSION: Hypodensity in the left aspect of the pons and midbrain may reflect artifact; however, acute infarct is not excluded. Recommend brain MRI for further evaluation. These results will be called to the ordering clinician or representative by the Radiologist Assistant, and communication documented in the PACS or [employer]. Electronically Signed   By: Valetta Mole M.D.   On: 06/01/2022 13:00    Scheduled Meds:  atorvastatin  20 mg Oral Daily   busPIRone  7.5 mg Oral BID   carvedilol  3.125 mg Oral BID WC   Chlorhexidine Gluconate Cloth  6 each Topical Once   clopidogrel  75 mg Oral Q breakfast   feeding supplement  237 mL Oral TID BM   furosemide  20 mg Oral Daily   heparin  5,000 Units Subcutaneous Q8H   lisinopril  5 mg Oral Daily   multivitamin with minerals  1 tablet Oral Daily   nicotine  21 mg Transdermal Daily   pantoprazole  20 mg Oral Daily   phosphorus  500 mg Oral QID   sertraline  100 mg Oral Daily   sodium chloride flush  3 mL Intravenous Q12H   Continuous Infusions:  sodium chloride     cefTRIAXone (ROCEPHIN)  IV Stopped  (06/01/22 1612)   metronidazole 500 mg (06/02/22 0433)   vancomycin Stopped (06/01/22 2308)   PRN Meds: sodium chloride, acetaminophen, acetaminophen, albuterol, ALPRAZolam, dextromethorphan-guaiFENesin, fentaNYL (SUBLIMAZE) injection, hydrALAZINE, hydrOXYzine, melatonin, morphine injection, morphine injection, ondansetron (ZOFRAN) IV, ondansetron (ZOFRAN) IV, ondansetron (ZOFRAN) IV, oxyCODONE, oxyCODONE-acetaminophen, senna-docusate, sodium chloride flush, tetrahydrozoline  Time spent: 50 minutes  Author: Val Riles. MD [facility] 06/02/2022 12:14 PM  To reach On-call, see care teams to locate the attending and reach out to them via [email or web address]. If 7PM-7AM, please contact night-coverage If you still have difficulty reaching the attending provider, please page the [facility] (Director on Call) for [facility] on amion for assistance.

## 2022-06-03 DIAGNOSIS — L089 Local infection of the skin and subcutaneous tissue, unspecified: Secondary | ICD-10-CM | POA: Diagnosis not present

## 2022-06-03 LAB — CBC
HCT: 31.9 % — ABNORMAL LOW (ref 36.0–46.0)
Hemoglobin: 10 g/dL — ABNORMAL LOW (ref 12.0–15.0)
MCH: 27.5 pg (ref 26.0–34.0)
MCHC: 31.3 g/dL (ref 30.0–36.0)
MCV: 87.9 fL (ref 80.0–100.0)
Platelets: 214 10*3/uL (ref 150–400)
RBC: 3.63 MIL/uL — ABNORMAL LOW (ref 3.87–5.11)
RDW: 15.8 % — ABNORMAL HIGH (ref 11.5–15.5)
WBC: 10.3 10*3/uL (ref 4.0–10.5)
nRBC: 0 % (ref 0.0–0.2)

## 2022-06-03 LAB — BASIC METABOLIC PANEL
Anion gap: 8 (ref 5–15)
BUN: 18 mg/dL (ref 8–23)
CO2: 27 mmol/L (ref 22–32)
Calcium: 8.4 mg/dL — ABNORMAL LOW (ref 8.9–10.3)
Chloride: 100 mmol/L (ref 98–111)
Creatinine, Ser: 0.6 mg/dL (ref 0.44–1.00)
GFR, Estimated: >60 mL/min (ref >60)
Glucose, Bld: 94 mg/dL (ref 70–99)
Potassium: 3.5 mmol/L (ref 3.5–5.1)
Sodium: 135 mmol/L (ref 135–145)

## 2022-06-03 LAB — PHOSPHORUS: Phosphorus: 3 mg/dL (ref 2.5–4.6)

## 2022-06-03 LAB — MAGNESIUM: Magnesium: 2.1 mg/dL (ref 1.7–2.4)

## 2022-06-03 NOTE — Progress Notes (Signed)
[facility] Progress Note  [name]    YEM:[id number]  DOA: 05/26/2022     Date of Service: the patient was seen and examined on 06/03/2022  Chief Complaint  Patient presents with   Wound Infection   Brief hospital course: Michele Matthews is a 77 y.o. 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  She was found to have a left heel wound with infection.  Podiatry consult obtained, patient started antibiotics with Rocephin and vancomycin. PAD, seen by vascular surgery, scheduled surgery today.     Assessment and Plan: Left foot infection:  Peripheral arterial disease. S/p left foot TMA done on 11/30/2020 No evidence of sepsis.  Patient has been seen by podiatry, started on vancomycin, Flagyl and Rocephin. Patient was not able to tolerate MRI even give her dose of Ativan.  Currently pending ABI test to determine the next course of action.  Given high dose of oxycodone prior to the ABI testing as patient has unable to tolerate. Discussed with Dr. Posey Pronto, patient does not need debridement.  Recommend antibiotic treatment and follow-up with wound care. Vascular surgery consulted, s/p LLE angiogram,  Diffuse atherosclerotic changes of the left lower extremity, occlusion of the SFA stents as well as the SFA and the popliteal in its entirety.  There appears to be anterior tibial runoff however the this does not appear to cross the ankle. Continue Plavix Wound care RN consulted 2/1 d/w vascular surgery, patient is a bypass surgery which will take around 5 hours. 2/2 currently patient is not ready for the bypass surgery, would like to go home and get it done as an outpatient.  It will be scheduled by vascular surgery in future Awaiting for podiatry for recommendation of antibiotics  and discharge planning.    Chronic combined systolic and diastolic heart failure ([place]): 2D echo on 07/24/2017 showed EF 25 to 30% with grade 2 diastolic dysfunction.  Patient does not have any short of breath, BNP 284, patient does not have exacerbation congestive heart failure.   Appear euvolemic. Continued Lasix 20 mg twice daily, Coreg 3.125 twice daily, lisinopril 5 mg twice daily home dose. 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  Right atrium moderately dilated. 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 For  myxoma, just needs to be monitored if no symptoms.  And can follow-up as an outpatient with cardiology for further management plans down the road.   COPD (chronic obstructive pulmonary disease): Stable   History of stroke with left hemiparesis. Continue home medicines.  1/31 c/o right-sided weakness since admission which is new 1/31 CT head: Hypodensity in the left aspect of the pons and midbrain may reflect artifact; however, acute infarct is not excluded. Recommend brain MRI for further evaluation. Patient is refusing MRI, cannot tolerate it  Continue monitor on telemetry, continue neurocheck every 4 hourly Cautions fall and aspiration precautions Ambulate with assistance, frequent turning every 8 hourly Check TSH level 2/1 D/w patient's husband over the phone that if he is still concerned then we can repeat CT scan of the head.  He will let us know.  Patient is unable to understand her medical situation.  Coronary artery disease:  S/p Plavix 300 po once, followed by Plavix 75 mg p.o. daily   Essential hypertension On Coreg and lisinopril   Depression with anxiety -Continue home medications 2/1 started Xanax as needed  Tobacco abuse -nicotine patch Advised to quit.    Body mass index is 21.94  kg/m.  Interventions:       Diet: Heart healthy diet DVT Prophylaxis: Subcutaneous Heparin    Advance goals of care discussion: Full code  Family Communication: family was not present at bedside, at the time of interview.  The pt provided permission to discuss medical plan with the family. Opportunity was given to ask question and all questions were answered satisfactorily.   Disposition:  Pt is from Home, admitted with left foot infection, peripheral vascular disease, still has PVD, vascular surgery following, which precludes a safe discharge. Discharge to SNF, when clinically stable, need podiatry and vascular surgery clearance.  Subjective: No significant events overnight, patient was resting comfortably, no anxiety today.  Patient stated that she has a little bit of the pain in the foot but not much, she slept well.  Denies any active issues.  Patient would like to go home and come back for the surgery. Awaiting for podiatry clearance.    Physical Exam: General: NAD, resting comfortably Appear in no distress, affect appropriate Eyes: PERRLA ENT: Oral Mucosa Clear, moist  Neck: no JVD,  Cardiovascular: S1 and S2 Present, no Murmur,  Respiratory: good respiratory effort, Bilateral Air entry equal and Decreased, no Crackles, no wheezes Abdomen: Bowel Sound present, Soft and no tenderness,  Skin: no rashes Extremities: no Pedal edema, no calf tenderness, left foot dressing CDI Neurologic: Residual weakness on the left side.  Right arm no weakness, right leg is weak. Gait not checked due to patient safety concerns  Vitals:   06/02/22 1959 06/03/22 0006 06/03/22 0456 06/03/22 0842  BP: (!) 147/82 (!) 151/61 112/82 (!) 170/88  Pulse: 96 99 100 (!) 106  Resp: '20 20 16 20  '$ Temp: 99 F (37.2 C) 98.4 F (36.9 C) 98.4 F (36.9 C) 98.3 F (36.8 C)  TempSrc:  Oral Oral   SpO2: 100% 94% 95% 95%  Weight:      Height:        Intake/Output Summary (Last 24 hours) at 06/03/2022  1427 Last data filed at 06/03/2022 1100 Gross per 24 hour  Intake 1170.46 ml  Output 600 ml  Net 570.46 ml   Filed Weights   05/31/22 0944 06/01/22 0408 06/02/22 0413  Weight: 57.4 kg 59.8 kg 60.2 kg    Data Reviewed: I have personally reviewed and interpreted daily labs, tele strips, imagings as discussed above. I reviewed all nursing notes, pharmacy notes, vitals, pertinent old records I have discussed plan of care as described above with RN and patient/family.  CBC: Recent Labs  Lab 06/01/22 0918 06/02/22 0530 06/03/22 0321  WBC 9.3 10.3 10.3  HGB 10.4* 10.1* 10.0*  HCT 33.6* 32.0* 31.9*  MCV 88.7 88.9 87.9  PLT 207 208 751   Basic Metabolic Panel: Recent Labs  Lab 05/30/22 0341 05/31/22 2205 06/01/22 0918 06/02/22 0530 06/03/22 0321  NA  --   --  136 138 135  K  --   --  3.7 3.6 3.5  CL  --   --  102 104 100  CO2  --   --  '25 27 27  '$ GLUCOSE  --   --  98 137* 94  BUN  --   --  $'15 21 18  'W$ CREATININE 0.80 0.72 0.62 0.72 0.60  CALCIUM  --   --  8.7* 8.6* 8.4*  MG  --   --  2.0 1.7 2.1  PHOS  --   --  3.0 2.4* 3.0    Studies: No results found.  Scheduled Meds:  atorvastatin  20 mg Oral Daily   busPIRone  7.5 mg Oral BID   carvedilol  3.125 mg Oral BID WC   Chlorhexidine Gluconate Cloth  6 each Topical Once   clopidogrel  75 mg Oral Q breakfast   feeding supplement  237 mL Oral TID BM   furosemide  20 mg Oral Daily   heparin  5,000 Units Subcutaneous Q8H   lisinopril  5 mg Oral Daily   multivitamin with minerals  1 tablet Oral Daily   nicotine  21 mg Transdermal Daily   pantoprazole  20 mg Oral Daily   sertraline  100 mg Oral Daily   sodium chloride flush  3 mL Intravenous Q12H   Continuous Infusions:  sodium chloride     cefTRIAXone (ROCEPHIN)  IV Stopped (06/02/22 1733)   metronidazole 500 mg (06/03/22 0455)   vancomycin 1,000 mg (06/02/22 2208)   PRN Meds: sodium chloride, acetaminophen, acetaminophen, albuterol, ALPRAZolam,  dextromethorphan-guaiFENesin, fentaNYL (SUBLIMAZE) injection, hydrALAZINE, hydrOXYzine, melatonin, morphine injection, morphine injection, ondansetron (ZOFRAN) IV, ondansetron (ZOFRAN) IV, ondansetron (ZOFRAN) IV, oxyCODONE, oxyCODONE-acetaminophen, senna-docusate, sodium chloride flush, tetrahydrozoline  Time spent: 35 minutes  Author: Val Riles. MD [facility] 06/03/2022 2:27 PM  To reach On-call, see care teams to locate the attending and reach out to them via [email or web address]. If 7PM-7AM, please contact night-coverage If you still have difficulty reaching the attending provider, please page the [facility] (Director on Call) for [facility] on amion for assistance.

## 2022-06-03 NOTE — Progress Notes (Signed)
  Progress Note    06/03/2022 11:37 AM 3 Days Post-Op  Subjective:  Michele Matthews is a 77 yo female who POD #2 S/P Left lower extremity angiography. Percutaneous transluminal angioplasty and stent placement left external iliac artery.   Patient is resting comfortably in bed this morning. Denies any pain upon movement or at rest to her left lower extremity. Not as agitated today. States her family wants her to have bypass surgery but she wishes to wait. Wants to go home first and see her family before having a big surgery.    Vitals:   06/03/22 0456 06/03/22 0842  BP: 112/82 (!) 170/88  Pulse: 100 (!) 106  Resp: 16 20  Temp: 98.4 F (36.9 C) 98.3 F (36.8 C)  SpO2: 95% 95%   Physical Exam: Cardiac:  RRR Lungs:  Clear Throughout Incisions:  Right Groin dressing C/D/I without hematoma, seroma Extremities:  Left lower extremity with transmetatarsal amputation site and unstageable pressure ulcer to heel.   Abdomen:  Positive bowel sounds, soft, flat NT/ND  Neurologic: AAOX3 Agitated today   CBC    Component Value Date/Time   WBC 10.3 06/03/2022 0321   RBC 3.63 (L) 06/03/2022 0321   HGB 10.0 (L) 06/03/2022 0321   HGB 12.1 03/27/2014 1730   HCT 31.9 (L) 06/03/2022 0321   HCT 37.0 03/27/2014 1730   PLT 214 06/03/2022 0321   PLT 175 03/27/2014 1730   MCV 87.9 06/03/2022 0321   MCV 97 03/27/2014 1730   MCH 27.5 06/03/2022 0321   MCHC 31.3 06/03/2022 0321   RDW 15.8 (H) 06/03/2022 0321   RDW 13.3 03/27/2014 1730   LYMPHSABS 1.2 05/26/2022 1354   LYMPHSABS 1.9 11/25/2013 0401   MONOABS 0.6 05/26/2022 1354   MONOABS 0.7 11/25/2013 0401   EOSABS 0.2 05/26/2022 1354   EOSABS 0.2 11/25/2013 0401   BASOSABS 0.0 05/26/2022 1354   BASOSABS 0.0 11/25/2013 0401    BMET    Component Value Date/Time   NA 135 06/03/2022 0321   NA 140 03/28/2014 0441   K 3.5 06/03/2022 0321   K 4.0 03/28/2014 0441   CL 100 06/03/2022 0321   CL 110 (H) 03/28/2014 0441   CO2 27 06/03/2022 0321    CO2 24 03/28/2014 0441   GLUCOSE 94 06/03/2022 0321   GLUCOSE 86 03/28/2014 0441   BUN 18 06/03/2022 0321   BUN 19 (H) 03/28/2014 0441   CREATININE 0.60 06/03/2022 0321   CREATININE 0.79 11/24/2015 0908   CALCIUM 8.4 (L) 06/03/2022 0321   CALCIUM 8.7 03/28/2014 0441   GFRNONAA >60 06/03/2022 0321   GFRNONAA 76 11/24/2015 0908   GFRAA >60 07/28/2017 0507   GFRAA 87 11/24/2015 0908    INR    Component Value Date/Time   INR 1.0 05/26/2022 1354   INR 2.0 03/30/2014 0336     Intake/Output Summary (Last 24 hours) at 06/03/2022 1137 Last data filed at 06/03/2022 0600 Gross per 24 hour  Intake 920.46 ml  Output 1400 ml  Net -479.54 ml     Assessment/Plan:  77 y.o. female is s/p Left lower extremity angiography. Percutaneous transluminal angioplasty and stent placement left external iliac artery 3 Days Post-Op   PLAN:  Wound Care Consult completed. Continue wound care as outlined.  Patient still trying to make a decision about surgery.   DVT prophylaxis:  Plavix 75 MG QD   Drema Pry [facility] 06/03/2022 11:37 AM

## 2022-06-03 NOTE — Plan of Care (Signed)
Patient AOX4, VSS throughout shift.  All meds given on time as ordered.  Pt c/o pain relieved by PRN morphine and oxycodone.  Left foot dressing intact.  Purewick in place.  Diminished lungs, IS encouraged.  POC maintained, will continue to monitor.  Problem: Education: Goal: Knowledge of General Education information will improve Description: Including pain rating scale, medication(s)/side effects and non-pharmacologic comfort measures Outcome: Progressing   Problem: Health Behavior/Discharge Planning: Goal: Ability to manage health-related needs will improve Outcome: Progressing   Problem: Clinical Measurements: Goal: Ability to maintain clinical measurements within normal limits will improve Outcome: Progressing Goal: Will remain free from infection Outcome: Progressing Goal: Diagnostic test results will improve Outcome: Progressing Goal: Respiratory complications will improve Outcome: Progressing Goal: Cardiovascular complication will be avoided Outcome: Progressing   Problem: Activity: Goal: Risk for activity intolerance will decrease Outcome: Progressing   Problem: Nutrition: Goal: Adequate nutrition will be maintained Outcome: Progressing   Problem: Coping: Goal: Level of anxiety will decrease Outcome: Progressing   Problem: Elimination: Goal: Will not experience complications related to bowel motility Outcome: Progressing Goal: Will not experience complications related to urinary retention Outcome: Progressing   Problem: Pain Managment: Goal: General experience of comfort will improve Outcome: Progressing   Problem: Safety: Goal: Ability to remain free from injury will improve Outcome: Progressing   Problem: Skin Integrity: Goal: Risk for impaired skin integrity will decrease Outcome: Progressing   Problem: Education: Goal: Understanding of CV disease, CV risk reduction, and recovery process will improve Outcome: Progressing Goal: Individualized  Educational Video(s) Outcome: Progressing   Problem: Activity: Goal: Ability to return to baseline activity level will improve Outcome: Progressing   Problem: Cardiovascular: Goal: Ability to achieve and maintain adequate cardiovascular perfusion will improve Outcome: Progressing Goal: Vascular access site(s) Level 0-1 will be maintained Outcome: Progressing   Problem: Health Behavior/Discharge Planning: Goal: Ability to safely manage health-related needs after discharge will improve Outcome: Progressing

## 2022-06-03 NOTE — Consult Note (Signed)
Pharmacy Antibiotic Note  Michele Matthews is a 77 y.o. female admitted on 05/26/2022 with  L foot infection . PMH significant for PAD, HTN, HLD, COPD, CAD, CHF (EF 25-30%), stroke, depression with anxiety, upper GIB. Patient underwent TMA of L foot 11/30/20. Pharmacy has been consulted for vancomycin dosing. Renal function has been stable  Plan: Day 9 of  Vancomycin 1000 mg  IV every 24 hours Goal AUC 400-550 Steady state AUC calculated (pk 41, tr 9) = 536.1 Vd 25.2 Cmax 46.1 Continue to monitor renal function and follow culture results    Height: '5\' 5"'$  (165.1 cm) Weight: 60.2 kg (132 lb 11.5 oz) IBW/kg (Calculated) : 57  Temp (24hrs), Avg:98.5 F (36.9 C), Min:98.2 F (36.8 C), Max:99 F (37.2 C)  Recent Labs  Lab 05/30/22 0341 05/31/22 2205 06/01/22 0026 06/01/22 0918 06/01/22 2052 06/02/22 0530 06/03/22 0321  WBC  --   --   --  9.3  --  10.3 10.3  CREATININE 0.80 0.72  --  0.62  --  0.72 0.60  VANCOPEAK  --   --  41*  --   --   --   --   VANCORANDOM  --  9  --   --  9  --   --      Estimated Creatinine Clearance: 53 mL/min (by C-G formula based on SCr of 0.6 mg/dL).    No Known Allergies  Antimicrobials this admission: 1/25 vancomycin >>  1/25 ceftriaxone >>  1/25 metronidazole >>   Thank you for allowing pharmacy to be a part of this patient's care.  Vallery Sa, PharmD, BCPS 06/03/2022 7:08 AM

## 2022-06-03 NOTE — Progress Notes (Signed)
SUBJECTIVE: Patient is confused   Vitals:   06/02/22 1959 06/03/22 0006 06/03/22 0456 06/03/22 0842  BP: (!) 147/82 (!) 151/61 112/82 (!) 170/88  Pulse: 96 99 100 (!) 106  Resp: '20 20 16 20  '$ Temp: 99 F (37.2 C) 98.4 F (36.9 C) 98.4 F (36.9 C) 98.3 F (36.8 C)  TempSrc:  Oral Oral   SpO2: 100% 94% 95% 95%  Weight:      Height:        Intake/Output Summary (Last 24 hours) at 06/03/2022 0905 Last data filed at 06/03/2022 0600 Gross per 24 hour  Intake 920.46 ml  Output 1400 ml  Net -479.54 ml    LABS: Basic Metabolic Panel: Recent Labs    06/02/22 0530 06/03/22 0321  NA 138 135  K 3.6 3.5  CL 104 100  CO2 27 27  GLUCOSE 137* 94  BUN 21 18  CREATININE 0.72 0.60  CALCIUM 8.6* 8.4*  MG 1.7 2.1  PHOS 2.4* 3.0   Liver Function Tests: No results for input(s): "AST", "ALT", "ALKPHOS", "BILITOT", "PROT", "ALBUMIN" in the last 72 hours. No results for input(s): "LIPASE", "AMYLASE" in the last 72 hours. CBC: Recent Labs    06/02/22 0530 06/03/22 0321  WBC 10.3 10.3  HGB 10.1* 10.0*  HCT 32.0* 31.9*  MCV 88.9 87.9  PLT 208 214   Cardiac Enzymes: No results for input(s): "CKTOTAL", "CKMB", "CKMBINDEX", "TROPONINI" in the last 72 hours. BNP: Invalid input(s): "POCBNP" D-Dimer: No results for input(s): "DDIMER" in the last 72 hours. Hemoglobin A1C: No results for input(s): "HGBA1C" in the last 72 hours. Fasting Lipid Panel: No results for input(s): "CHOL", "HDL", "LDLCALC", "TRIG", "CHOLHDL", "LDLDIRECT" in the last 72 hours. Thyroid Function Tests: Recent Labs    06/01/22 0918  TSH 3.152   Anemia Panel: No results for input(s): "VITAMINB12", "FOLATE", "FERRITIN", "TIBC", "IRON", "RETICCTPCT" in the last 72 hours.   PHYSICAL EXAM General: Well developed, well nourished, in no acute distress HEENT:  Normocephalic and atramatic Neck:  No JVD.  Lungs: Clear bilaterally to auscultation and percussion. Heart: HRRR . Normal S1 and S2 without gallops or  murmurs.  Abdomen: Bowel sounds are positive, abdomen soft and non-tender  Msk:  Back normal, normal gait. Normal strength and tone for age. Extremities: No clubbing, cyanosis or edema.   Neuro: Alert and oriented X 3. Psych:  Good affect, responds appropriately  TELEMETRY: Normal sinus rhythm with sinus tachycardia 104 bpm  ASSESSMENT AND PLAN: Severe peripheral vascular disease with history of coronary artery disease and tobacco use and COPD along with left foot infection.  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  Risk of vascular surgery is moderate to high given her condition.  But since surgery is needed urgently advise proceeding with surgery.  Principal Problem:   Left foot infection Active Problems:   COPD (chronic obstructive pulmonary disease) (HCC)   Coronary artery disease   Tobacco abuse   Essential hypertension   Chronic combined systolic and diastolic heart failure (HCC)   PAD (peripheral artery disease) (HCC)   HLD (hyperlipidemia)   Depression with anxiety   Leg edema, right   Hemiparesis affecting left side as late effect of cerebrovascular accident (CVA) ([place])    Neoma Laming, MD, [facility] 06/03/2022 9:05 AM

## 2022-06-04 DIAGNOSIS — L089 Local infection of the skin and subcutaneous tissue, unspecified: Secondary | ICD-10-CM | POA: Diagnosis not present

## 2022-06-04 LAB — BASIC METABOLIC PANEL
Anion gap: 8 (ref 5–15)
BUN: 19 mg/dL (ref 8–23)
CO2: 26 mmol/L (ref 22–32)
Calcium: 8.7 mg/dL — ABNORMAL LOW (ref 8.9–10.3)
Chloride: 97 mmol/L — ABNORMAL LOW (ref 98–111)
Creatinine, Ser: 0.62 mg/dL (ref 0.44–1.00)
GFR, Estimated: >60 mL/min (ref >60)
Glucose, Bld: 96 mg/dL (ref 70–99)
Potassium: 3.7 mmol/L (ref 3.5–5.1)
Sodium: 131 mmol/L — ABNORMAL LOW (ref 135–145)

## 2022-06-04 LAB — PHOSPHORUS: Phosphorus: 3 mg/dL (ref 2.5–4.6)

## 2022-06-04 LAB — CBC
HCT: 32.1 % — ABNORMAL LOW (ref 36.0–46.0)
Hemoglobin: 10 g/dL — ABNORMAL LOW (ref 12.0–15.0)
MCH: 27.3 pg (ref 26.0–34.0)
MCHC: 31.2 g/dL (ref 30.0–36.0)
MCV: 87.7 fL (ref 80.0–100.0)
Platelets: 212 10*3/uL (ref 150–400)
RBC: 3.66 MIL/uL — ABNORMAL LOW (ref 3.87–5.11)
RDW: 15.9 % — ABNORMAL HIGH (ref 11.5–15.5)
WBC: 11.2 10*3/uL — ABNORMAL HIGH (ref 4.0–10.5)
nRBC: 0 % (ref 0.0–0.2)

## 2022-06-04 LAB — OSMOLALITY: Osmolality: 285 mOsm/kg (ref 275–295)

## 2022-06-04 LAB — MAGNESIUM: Magnesium: 1.9 mg/dL (ref 1.7–2.4)

## 2022-06-04 MED ORDER — DICLOFENAC SODIUM 1 % EX GEL
2.0000 g | Freq: Four times a day (QID) | CUTANEOUS | Status: DC | PRN
  Administered 2022-06-10 – 2022-06-11 (×2): 2 g via TOPICAL
  Filled 2022-06-04: qty 100

## 2022-06-04 MED ORDER — DOXYCYCLINE HYCLATE 100 MG PO TABS
100.0000 mg | ORAL_TABLET | Freq: Two times a day (BID) | ORAL | Status: AC
  Administered 2022-06-04 – 2022-06-13 (×19): 100 mg via ORAL
  Filled 2022-06-04 (×19): qty 1

## 2022-06-04 NOTE — Progress Notes (Signed)
      Daily Progress Note   Assessment/Planning:   POD #4 s/p LRo, PTA+S L EIA  Pt remains confused Pt and family still haven't made decision on L leg bypass Dr. Ronalee Belts will be back on Monday   Subjective  - 4 Days Post-Op   confused   Objective   Vitals:   06/03/22 2035 06/04/22 0422 06/04/22 0429 06/04/22 0847  BP: 116/71 (!) 145/69  138/70  Pulse: (!) 102 96  (!) 106  Resp: '18 18  20  '$ Temp: 98.7 F (37.1 C) 98.9 F (37.2 C)  98.7 F (37.1 C)  TempSrc: Oral Oral    SpO2: 94% 93%  94%  Weight:   61 kg   Height:         Intake/Output Summary (Last 24 hours) at 06/04/2022 0934 Last data filed at 06/04/2022 0432 Gross per 24 hour  Intake 670 ml  Output 1200 ml  Net -530 ml    VASC L foot bandage, R groin: no large hematoma or PSA    Laboratory   CBC    Latest Ref Rng & Units 06/04/2022    3:17 AM 06/03/2022    3:21 AM 06/02/2022    5:30 AM  CBC  WBC 4.0 - 10.5 K/uL 11.2  10.3  10.3   Hemoglobin 12.0 - 15.0 g/dL 10.0  10.0  10.1   Hematocrit 36.0 - 46.0 % 32.1  31.9  32.0   Platelets 150 - 400 K/uL 212  214  208     BMET    Component Value Date/Time   NA 131 (L) 06/04/2022 0630   NA 140 03/28/2014 0441   K 3.7 06/04/2022 0630   K 4.0 03/28/2014 0441   CL 97 (L) 06/04/2022 0630   CL 110 (H) 03/28/2014 0441   CO2 26 06/04/2022 0630   CO2 24 03/28/2014 0441   GLUCOSE 96 06/04/2022 0630   GLUCOSE 86 03/28/2014 0441   BUN 19 06/04/2022 0630   BUN 19 (H) 03/28/2014 0441   CREATININE 0.62 06/04/2022 0630   CREATININE 0.79 11/24/2015 0908   CALCIUM 8.7 (L) 06/04/2022 0630   CALCIUM 8.7 03/28/2014 0441   GFRNONAA >60 06/04/2022 0630   GFRNONAA 76 11/24/2015 0908   GFRAA >60 07/28/2017 0507   GFRAA 87 11/24/2015 0908     Adele Barthel, MD, FACS, FSVS Covering for [facility]: [phone]  06/04/2022, 9:34 AM

## 2022-06-04 NOTE — Progress Notes (Signed)
[facility] Progress Note  [name]    GEX:[id number]  DOA: 05/26/2022     Date of Service: the patient was seen and examined on 06/04/2022  Chief Complaint  Patient presents with   Wound Infection   Brief hospital course: Michele Matthews is a 77 y.o. 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  She was found to have a left heel wound with infection.  Podiatry consult obtained, patient started antibiotics with Rocephin and vancomycin. PAD, seen by vascular surgery, scheduled surgery today.     Assessment and Plan: Left foot infection:  Peripheral arterial disease. S/p left foot TMA done on 11/30/2020 No evidence of sepsis.  Patient has been seen by podiatry, started on vancomycin, Flagyl and Rocephin. Patient was not able to tolerate MRI even give her dose of Ativan.  Currently pending ABI test to determine the next course of action.  Given high dose of oxycodone prior to the ABI testing as patient has unable to tolerate. Discussed with Dr. Posey Pronto, patient does not need debridement.  Recommend antibiotic treatment and follow-up with wound care. Vascular surgery consulted, s/p LLE angiogram,  Diffuse atherosclerotic changes of the left lower extremity, occlusion of the SFA stents as well as the SFA and the popliteal in its entirety.  There appears to be anterior tibial runoff however the this does not appear to cross the ankle. Continue Plavix Wound care RN consulted 2/1 d/w vascular surgery, patient is a bypass surgery which will take around 5 hours. 2/3 as per podiatry, antibiotics can be changed to oral doxycycline for 10 days.  Patient and family need to decide for left lower extremity bypass surgery, still no decision.    Chronic combined systolic and diastolic  heart failure (Michele Matthews): 2D echo on 07/24/2017 showed EF 25 to 30% with grade 2 diastolic dysfunction.  Patient does not have any short of breath, BNP 284, patient does not have exacerbation congestive heart failure.   Appear euvolemic. Continued Lasix 20 mg twice daily, Coreg 3.125 twice daily, lisinopril 5 mg twice daily home dose. 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  Right atrium moderately dilated. 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 For  myxoma, just needs to be monitored if no symptoms.  And can follow-up as an outpatient with cardiology for further management plans down the road.   COPD (chronic obstructive pulmonary disease): Stable   History of stroke with left hemiparesis. Continue home medicines.  1/31 c/o right-sided weakness since admission which is new 1/31 CT head: Hypodensity in the left aspect of the pons and midbrain may reflect artifact; however, acute infarct is not excluded. Recommend brain MRI for further evaluation. Patient is refusing MRI, cannot tolerate it  Continue monitor on telemetry, continue neurocheck every 4 hourly Cautions fall and aspiration precautions Ambulate with assistance, frequent turning every 8 hourly Check TSH level 2/1 D/w patient's husband over the phone that if he is still concerned then we can repeat CT scan of the head.  He will let us know.  Patient is unable to understand her medical situation.  Hyponatremia Na 131 Monitor sodium level daily Check serum osmolality  Coronary artery disease:  S/p Plavix 300 po once, followed by Plavix 75 mg p.o. daily   Essential hypertension On Coreg and lisinopril   Depression with anxiety -Continue home medications 2/1 started Xanax as needed  Tobacco abuse -nicotine patch Advised to quit.    Body mass index is  21.94 kg/m.  Interventions:       Diet: Heart healthy diet DVT Prophylaxis: Subcutaneous Heparin    Advance goals of care discussion: Full code  Family Communication: family was not present at bedside, at the time of interview.  The pt provided permission to discuss medical plan with the family. Opportunity was given to ask question and all questions were answered satisfactorily.  2/2 discussed with patient's husband over the phone. 2/3 discussed with patient's husband and her daughter over the phone  Disposition:  Pt is from Home, admitted with left foot infection, peripheral vascular disease, still has PVD, vascular surgery following, needs LLE bypass sx, which precludes a safe discharge. Discharge to SNF vs HH, when clinically stable. 2/3 discussed with family to come and physically eval her if they are planning to take her home.  If they are able to manage then we can discharge her home or we can proceed with SNF placement.  They also need to decide for bypass surgery.   Subjective: No significant events overnight, patient stated pain is under control, no any active issues.  Patient seems little bit confused, unable to make decision regarding going home versus bypass surgery. So I called patient's husband and patient's daughter, discussed management plan, they will come today to make the final decision.    Physical Exam: General: NAD, resting comfortably, seems anxious and confused Appear in no distress, affect appropriate Eyes: PERRLA ENT: Oral Mucosa Clear, moist  Neck: no JVD,  Cardiovascular: S1 and S2 Present, no Murmur,  Respiratory: good respiratory effort, Bilateral Air entry equal and Decreased, no Crackles, no wheezes Abdomen: Bowel Sound present, Soft and no tenderness,  Skin: no rashes Extremities: no Pedal edema, no calf tenderness, left foot dressing CDI Neurologic: Residual weakness on the left side.  Right arm no weakness, right leg is weak. Gait not checked  due to patient safety concerns  Vitals:   06/03/22 2035 06/04/22 0422 06/04/22 0429 06/04/22 0847  BP: 116/71 (!) 145/69  138/70  Pulse: (!) 102 96  (!) 106  Resp: '18 18  20  '$ Temp: 98.7 F (37.1 C) 98.9 F (37.2 C)  98.7 F (37.1 C)  TempSrc: Oral Oral    SpO2: 94% 93%  94%  Weight:   61 kg   Height:        Intake/Output Summary (Last 24 hours) at 06/04/2022 1146 Last data filed at 06/04/2022 1000 Gross per 24 hour  Intake 670 ml  Output 1900 ml  Net -1230 ml   Filed Weights   06/01/22 0408 06/02/22 0413 06/04/22 0429  Weight: 59.8 kg 60.2 kg 61 kg    Data Reviewed: I have personally reviewed and interpreted daily labs, tele strips, imagings as discussed above. I reviewed all nursing notes, pharmacy notes, vitals, pertinent old records I have discussed plan of care as described above with RN and patient/family.  CBC: Recent Labs  Lab 06/01/22 0918 06/02/22 0530 06/03/22 0321 06/04/22 0317  WBC 9.3 10.3 10.3 11.2*  HGB 10.4* 10.1* 10.0* 10.0*  HCT 33.6* 32.0* 31.9* 32.1*  MCV 88.7 88.9 87.9 87.7  PLT 207 208 214 993   Basic Metabolic Panel: Recent Labs  Lab  05/31/22 2205 06/01/22 0918 06/02/22 0530 06/03/22 0321 06/04/22 0630  NA  --  136 138 135 131*  K  --  3.7 3.6 3.5 3.7  CL  --  102 104 100 97*  CO2  --  '25 27 27 26  '$ GLUCOSE  --  98 137* 94 96  BUN  --  '15 21 18 19  '$ CREATININE 0.72 0.62 0.72 0.60 0.62  CALCIUM  --  8.7* 8.6* 8.4* 8.7*  MG  --  2.0 1.7 2.1 1.9  PHOS  --  3.0 2.4* 3.0 3.0    Studies: No results found.  Scheduled Meds:  atorvastatin  20 mg Oral Daily   busPIRone  7.5 mg Oral BID   carvedilol  3.125 mg Oral BID WC   Chlorhexidine Gluconate Cloth  6 each Topical Once   clopidogrel  75 mg Oral Q breakfast   doxycycline  100 mg Oral Q12H   feeding supplement  237 mL Oral TID BM   furosemide  20 mg Oral Daily   heparin  5,000 Units Subcutaneous Q8H   lisinopril  5 mg Oral Daily   multivitamin with minerals  1 tablet Oral Daily    nicotine  21 mg Transdermal Daily   pantoprazole  20 mg Oral Daily   sertraline  100 mg Oral Daily   sodium chloride flush  3 mL Intravenous Q12H   Continuous Infusions:  sodium chloride     PRN Meds: sodium chloride, acetaminophen, acetaminophen, albuterol, ALPRAZolam, dextromethorphan-guaiFENesin, fentaNYL (SUBLIMAZE) injection, hydrALAZINE, hydrOXYzine, melatonin, morphine injection, morphine injection, ondansetron (ZOFRAN) IV, ondansetron (ZOFRAN) IV, ondansetron (ZOFRAN) IV, oxyCODONE, oxyCODONE-acetaminophen, senna-docusate, sodium chloride flush, tetrahydrozoline  Time spent: 35 minutes  Author: Val Riles. MD [facility] 06/04/2022 11:46 AM  To reach On-call, see care teams to locate the attending and reach out to them via [email or web address]. If 7PM-7AM, please contact night-coverage If you still have difficulty reaching the attending provider, please page the [facility] (Director on Call) for [facility] on amion for assistance.

## 2022-06-04 NOTE — Progress Notes (Signed)
SUBJECTIVE: Patient is sleeping comfortably   Vitals:   06/03/22 2035 06/04/22 0422 06/04/22 0429 06/04/22 0847  BP: 116/71 (!) 145/69  138/70  Pulse: (!) 102 96  (!) 106  Resp: '18 18  20  '$ Temp: 98.7 F (37.1 C) 98.9 F (37.2 C)  98.7 F (37.1 C)  TempSrc: Oral Oral    SpO2: 94% 93%  94%  Weight:   61 kg   Height:        Intake/Output Summary (Last 24 hours) at 06/04/2022 1207 Last data filed at 06/04/2022 1000 Gross per 24 hour  Intake 670 ml  Output 1900 ml  Net -1230 ml    LABS: Basic Metabolic Panel: Recent Labs    06/03/22 0321 06/04/22 0630  NA 135 131*  K 3.5 3.7  CL 100 97*  CO2 27 26  GLUCOSE 94 96  BUN 18 19  CREATININE 0.60 0.62  CALCIUM 8.4* 8.7*  MG 2.1 1.9  PHOS 3.0 3.0   Liver Function Tests: No results for input(s): "AST", "ALT", "ALKPHOS", "BILITOT", "PROT", "ALBUMIN" in the last 72 hours. No results for input(s): "LIPASE", "AMYLASE" in the last 72 hours. CBC: Recent Labs    06/03/22 0321 06/04/22 0317  WBC 10.3 11.2*  HGB 10.0* 10.0*  HCT 31.9* 32.1*  MCV 87.9 87.7  PLT 214 212   Cardiac Enzymes: No results for input(s): "CKTOTAL", "CKMB", "CKMBINDEX", "TROPONINI" in the last 72 hours. BNP: Invalid input(s): "POCBNP" D-Dimer: No results for input(s): "DDIMER" in the last 72 hours. Hemoglobin A1C: No results for input(s): "HGBA1C" in the last 72 hours. Fasting Lipid Panel: No results for input(s): "CHOL", "HDL", "LDLCALC", "TRIG", "CHOLHDL", "LDLDIRECT" in the last 72 hours. Thyroid Function Tests: No results for input(s): "TSH", "T4TOTAL", "T3FREE", "THYROIDAB" in the last 72 hours.  Invalid input(s): "FREET3" Anemia Panel: No results for input(s): "VITAMINB12", "FOLATE", "FERRITIN", "TIBC", "IRON", "RETICCTPCT" in the last 72 hours.   PHYSICAL EXAM General: Well developed, well nourished, in no acute distress HEENT:  Normocephalic and atramatic Neck:  No JVD.  Lungs: Clear bilaterally to auscultation and percussion. Heart:  HRRR . Normal S1 and S2 without gallops or murmurs.  Abdomen: Bowel sounds are positive, abdomen soft and non-tender  Msk:  Back normal, normal gait. Normal strength and tone for age. Extremities: No clubbing, cyanosis or edema.   Neuro: Alert and oriented X 3. Psych:  Good affect, responds appropriately  TELEMETRY: Sinus rhythm  ASSESSMENT AND PLAN: Severe peripheral vascular disease requiring possible surgery with left atrial myxoma and moderate LV dysfunction.  Patient has been recommended to proceed with surgery.  Principal Problem:   Left foot infection Active Problems:   COPD (chronic obstructive pulmonary disease) (HCC)   Coronary artery disease   Tobacco abuse   Essential hypertension   Chronic combined systolic and diastolic heart failure (HCC)   PAD (peripheral artery disease) (HCC)   HLD (hyperlipidemia)   Depression with anxiety   Leg edema, right   Hemiparesis affecting left side as late effect of cerebrovascular accident (CVA) ([place])    Neoma Laming, MD, [facility] 06/04/2022 12:07 PM

## 2022-06-04 NOTE — Progress Notes (Signed)
Occupational Therapy Treatment Patient Details Name: Michele Matthews MRN: [id number] DOB: 25-Jun-1945 Today's Date: 06/04/2022   History of present illness Michele Matthews is a 77 y.o. 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  She was found to have a left heel wound with infection. Vascular consulted with plan for angiogram, at risk for amputation. Pt has been to rehab after past admission, generally negative experience per family with perceived decline in function by time to DC.   OT comments  Upon entering session, pt supine in bed curled up R side-lying position. Pt endorsing significant neck pain at rest. Attempted to assist pt with re-positioning in bed, however, favoring R side in attempt to alleviate neck pain. She required Max A to scoot up toward [facility], supervision for rolling onto her back, and set up-supervision for bed level grooming tasks. RN notified pt requesting pain meds. Pt left as received with all needs in reach. Pt is making progress toward goal completion. D/C recommendation remains appropriate. OT will continue to follow acutely.    Recommendations for follow up therapy are one component of a multi-disciplinary discharge planning process, led by the attending physician.  Recommendations may be updated based on patient status, additional functional criteria and insurance authorization.    Follow Up Recommendations  Skilled nursing-short term rehab (<3 hours/day)     Assistance Recommended at Discharge Frequent or constant Supervision/Assistance  Patient can return home with the following  Two people to help with walking and/or transfers;Two people to help with bathing/dressing/bathroom;Help with stairs or ramp for entrance;Assistance with cooking/housework;Assist for  transportation   Equipment Recommendations  Hospital bed    Recommendations for Other Services      Precautions / Restrictions Precautions Precautions: Fall Restrictions Weight Bearing Restrictions: Yes LLE Weight Bearing: Weight bearing as tolerated Other Position/Activity Restrictions: in surgical shoe       Mobility Bed Mobility Overal bed mobility: Needs Assistance   Rolling: Supervision (Pt found in R side lying. Able to roll onto back with supervision for repositioning, however, preferring to return to R side lying almost immediately in attempt to relieve neck pain.)         General bed mobility comments: Max A to scoot pt up toward [facility] 2/2 neck pain    Transfers Overall transfer level: Needs assistance                 General transfer comment: pt deferred     Balance Overall balance assessment: Needs assistance     Sitting balance - Comments: pt deferred                                   ADL either performed or assessed with clinical judgement   ADL Overall ADL's : Needs assistance/impaired     Grooming: Wash/dry face;Set up;Supervision/safety;Bed level                                 General ADL Comments: Activity tolerance very limited this date due to neck pain.    Extremity/Trunk Assessment Upper Extremity Assessment Upper Extremity Assessment: Generalized weakness   Lower Extremity Assessment Lower Extremity Assessment: Generalized weakness  Vision Patient Visual Report: No change from baseline     Perception     Praxis      Cognition Arousal/Alertness: Awake/alert Behavior During Therapy: WFL for tasks assessed/performed Overall Cognitive Status: No family/caregiver present to determine baseline cognitive functioning                                 General Comments: Pt able to answer questions appropriately and follow one step commands with increased time, very pain limited  this date        Exercises      Shoulder Instructions       General Comments      Pertinent Vitals/ Pain       Pain Assessment Pain Assessment: Faces Faces Pain Scale: Hurts whole lot Pain Location: neck (L side) Pain Descriptors / Indicators: Discomfort, Grimacing, Guarding, Crying Pain Intervention(s): Limited activity within patient's tolerance, Monitored during session, Repositioned, Patient requesting pain meds-RN notified  Home Living                                          Prior Functioning/Environment              Frequency  Min 2X/week        Progress Toward Goals  OT Goals(current goals can now be found in the care plan section)  Progress towards OT goals: Progressing toward goals  Acute Rehab OT Goals Patient Stated Goal: to go home OT Goal Formulation: With patient Time For Goal Achievement: 06/13/22 Potential to Achieve Goals: Good  Plan Discharge plan remains appropriate;Frequency remains appropriate    Co-evaluation                 AM-PAC OT "6 Clicks" Daily Activity     Outcome Measure   Help from another person eating meals?: None Help from another person taking care of personal grooming?: A Little Help from another person toileting, which includes using toliet, bedpan, or urinal?: A Lot Help from another person bathing (including washing, rinsing, drying)?: A Lot Help from another person to put on and taking off regular upper body clothing?: A Little Help from another person to put on and taking off regular lower body clothing?: A Lot 6 Click Score: 16    End of Session    OT Visit Diagnosis: Other abnormalities of gait and mobility (R26.89);Muscle weakness (generalized) (M62.81)   Activity Tolerance Patient limited by pain   Patient Left in bed;with call bell/phone within reach;with bed alarm set   Nurse Communication Mobility status;Patient requests pain meds        Time: 1555-1605 OT Time  Calculation (min): 10 min  Charges: OT General Charges $OT Visit: 1 Visit OT Treatments $Self Care/Home Management : 8-22 mins  [facility] MS, OTR/L ascom [phone]  06/04/22, 5:30 PM

## 2022-06-05 DIAGNOSIS — L089 Local infection of the skin and subcutaneous tissue, unspecified: Secondary | ICD-10-CM | POA: Diagnosis not present

## 2022-06-05 LAB — CBC
HCT: 33.3 % — ABNORMAL LOW (ref 36.0–46.0)
Hemoglobin: 10.5 g/dL — ABNORMAL LOW (ref 12.0–15.0)
MCH: 27.7 pg (ref 26.0–34.0)
MCHC: 31.5 g/dL (ref 30.0–36.0)
MCV: 87.9 fL (ref 80.0–100.0)
Platelets: 230 10*3/uL (ref 150–400)
RBC: 3.79 MIL/uL — ABNORMAL LOW (ref 3.87–5.11)
RDW: 15.9 % — ABNORMAL HIGH (ref 11.5–15.5)
WBC: 10.6 10*3/uL — ABNORMAL HIGH (ref 4.0–10.5)
nRBC: 0 % (ref 0.0–0.2)

## 2022-06-05 LAB — MAGNESIUM: Magnesium: 2.1 mg/dL (ref 1.7–2.4)

## 2022-06-05 LAB — BASIC METABOLIC PANEL
Anion gap: 7 (ref 5–15)
BUN: 20 mg/dL (ref 8–23)
CO2: 28 mmol/L (ref 22–32)
Calcium: 9.1 mg/dL (ref 8.9–10.3)
Chloride: 97 mmol/L — ABNORMAL LOW (ref 98–111)
Creatinine, Ser: 0.67 mg/dL (ref 0.44–1.00)
GFR, Estimated: >60 mL/min (ref >60)
Glucose, Bld: 97 mg/dL (ref 70–99)
Potassium: 3.8 mmol/L (ref 3.5–5.1)
Sodium: 132 mmol/L — ABNORMAL LOW (ref 135–145)

## 2022-06-05 LAB — PHOSPHORUS: Phosphorus: 3.4 mg/dL (ref 2.5–4.6)

## 2022-06-05 NOTE — Progress Notes (Signed)
[facility] Progress Note  [name]    ZOX:[id number]  DOA: 05/26/2022     Date of Service: the patient was seen and examined on 06/05/2022  Chief Complaint  Patient presents with   Wound Infection   Brief hospital course: Michele Matthews is a 77 y.o. 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  She was found to have a left heel wound with infection.  Podiatry consult obtained, patient started antibiotics with Rocephin and vancomycin. PAD, seen by vascular surgery, scheduled surgery today.     Assessment and Plan: Left foot infection:  Peripheral arterial disease. S/p left foot TMA done on 11/30/2020 No evidence of sepsis.  Patient has been seen by podiatry, s/p vancomycin, Flagyl and Rocephin.  Transition to doxycycline for 10 days on 06/04/22 Patient was not able to tolerate MRI even give her dose of Ativan.  Currently pending ABI test to determine the next course of action.  Given high dose of oxycodone prior to the ABI testing as patient has unable to tolerate. Discussed with Dr. Posey Pronto, patient does not need debridement.  Recommend antibiotic treatment and follow-up with wound care. Vascular surgery consulted, s/p LLE angiogram,  Diffuse atherosclerotic changes of the left lower extremity, occlusion of the SFA stents as well as the SFA and the popliteal in its entirety.  There appears to be anterior tibial runoff however the this does not appear to cross the ankle. Continue Plavix Wound care RN consulted 2/1 d/w vascular surgery, patient is a bypass surgery which will take around 5 hours. 2/3 as per podiatry, antibiotics can be changed to oral doxycycline for 10 days.   2/4 patient and family decided for LLE bypass, vascular surgery is aware, waiting for surgical  plan.   Chronic combined systolic and diastolic heart failure ([place]): 2D echo on 07/24/2017 showed EF 25 to 30% with grade 2 diastolic dysfunction.  Patient does not have any short of breath, BNP 284, patient does not have exacerbation congestive heart failure.   Appear euvolemic. Continued Lasix 20 mg twice daily, Coreg 3.125 twice daily, lisinopril 5 mg twice daily home dose. 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  Right atrium moderately dilated. 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 For  myxoma, just needs to be monitored if no symptoms.  And can follow-up as an outpatient with cardiology for further management plans down the road.   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   COPD (chronic obstructive pulmonary disease): Stable   History of stroke with left hemiparesis. Continue home medicines.  1/31 c/o right-sided weakness since admission which is new 1/31 CT head: Hypodensity in the left aspect of the pons and midbrain may reflect artifact; however, acute infarct is not excluded. Recommend brain MRI for further evaluation. Patient is refusing MRI, cannot tolerate it  Continue monitor on telemetry, continue neurocheck every 4 hourly Cautions fall and aspiration precautions Ambulate with assistance, frequent turning every 8 hourly TSH level 3.15 2/1 D/w patient's husband over the phone that if he is still concerned then we can repeat CT  scan of the head.  He will let us know.  Patient is unable to understand her medical situation.  Isotonic hyponatremia Serum osmolality 285 Na 131--132 Monitor sodium level daily   Coronary artery disease:  S/p Plavix 300 po once, followed by Plavix 75 mg p.o. daily   Essential  hypertension On Coreg and lisinopril   Depression with anxiety -Continue home medications 2/1 started Xanax as needed  Tobacco abuse -nicotine patch Advised to quit.    Body mass index is 21.94 kg/m.  Interventions:       Diet: Heart healthy diet DVT Prophylaxis: Subcutaneous Heparin    Advance goals of care discussion: Full code  Family Communication: family was not present at bedside, at the time of interview.  The pt provided permission to discuss medical plan with the family. Opportunity was given to ask question and all questions were answered satisfactorily.  2/2 discussed with patient's husband over the phone. 2/3 discussed with patient's husband and her daughter over the phone 2/4 d/w patient husband over the phone, agreed to consult palliative care but wanted her to be full code for now  Disposition:  Pt is from Home, admitted with left foot infection, peripheral vascular disease, still has PVD, vascular surgery following, needs LLE bypass sx, which precludes a safe discharge. Discharge to SNF vs HH, when clinically stable. 2/3 patient's family visited her and decided to move forward for LLE bypass surgery.  Vascular surgery was informed, waiting for surgical plan.     Subjective: No significant events overnight, patient stated that she cannot turn in the bed, feeling so weak and tired, pain is under control.  Patient is anxious, unable to understand her medical condition.   Physical Exam: General: NAD, resting comfortably, seems anxious and confused Appear in no distress, affect appropriate Eyes: PERRLA ENT: Oral Mucosa Clear, moist  Neck: no JVD,  Cardiovascular: S1 and S2 Present, no Murmur,  Respiratory: good respiratory effort, Bilateral Air entry equal and Decreased, no Crackles, no wheezes Abdomen: Bowel Sound present, Soft and no tenderness,  Skin: no rashes Extremities: no Pedal edema, no calf tenderness, left foot dressing CDI Neurologic: Residual  weakness on the left side.  Right arm no weakness, right leg is weak. Gait not checked due to patient safety concerns  Vitals:   06/05/22 0451 06/05/22 0500 06/05/22 0753 06/05/22 0954  BP: 115/71  133/79 (!) 108/59  Pulse: 92  92 (!) 101  Resp: '17  18 16  '$ Temp: 98.2 F (36.8 C)  98.5 F (36.9 C) 97.8 F (36.6 C)  TempSrc: Oral  Oral   SpO2: 90%  93% 92%  Weight:  61.4 kg    Height:        Intake/Output Summary (Last 24 hours) at 06/05/2022 1302 Last data filed at 06/05/2022 1012 Gross per 24 hour  Intake 680 ml  Output 5200 ml  Net -4520 ml   Filed Weights   06/02/22 0413 06/04/22 0429 06/05/22 0500  Weight: 60.2 kg 61 kg 61.4 kg    Data Reviewed: I have personally reviewed and interpreted daily labs, tele strips, imagings as discussed above. I reviewed all nursing notes, pharmacy notes, vitals, pertinent old records I have discussed plan of care as described above with RN and patient/family.  CBC: Recent Labs  Lab 06/01/22 0918 06/02/22 0530 06/03/22 0321 06/04/22 0317 06/05/22 0448  WBC 9.3 10.3 10.3 11.2* 10.6*  HGB 10.4* 10.1* 10.0* 10.0* 10.5*  HCT 33.6* 32.0* 31.9* 32.1* 33.3*  MCV 88.7 88.9  87.9 87.7 87.9  PLT 207 208 214 212 119   Basic Metabolic Panel: Recent Labs  Lab 06/01/22 0918 06/02/22 0530 06/03/22 0321 06/04/22 0630 06/05/22 0448  NA 136 138 135 131* 132*  K 3.7 3.6 3.5 3.7 3.8  CL 102 104 100 97* 97*  CO2 '25 27 27 26 28  '$ GLUCOSE 98 137* 94 96 97  BUN '15 21 18 19 20  '$ CREATININE 0.62 0.72 0.60 0.62 0.67  CALCIUM 8.7* 8.6* 8.4* 8.7* 9.1  MG 2.0 1.7 2.1 1.9 2.1  PHOS 3.0 2.4* 3.0 3.0 3.4    Studies: No results found.  Scheduled Meds:  atorvastatin  20 mg Oral Daily   busPIRone  7.5 mg Oral BID   carvedilol  3.125 mg Oral BID WC   Chlorhexidine Gluconate Cloth  6 each Topical Once   clopidogrel  75 mg Oral Q breakfast   doxycycline  100 mg Oral Q12H   feeding supplement  237 mL Oral TID BM   furosemide  20 mg Oral Daily    heparin  5,000 Units Subcutaneous Q8H   lisinopril  5 mg Oral Daily   multivitamin with minerals  1 tablet Oral Daily   nicotine  21 mg Transdermal Daily   pantoprazole  20 mg Oral Daily   sertraline  100 mg Oral Daily   sodium chloride flush  3 mL Intravenous Q12H   Continuous Infusions:  sodium chloride     PRN Meds: sodium chloride, acetaminophen, acetaminophen, albuterol, ALPRAZolam, dextromethorphan-guaiFENesin, diclofenac Sodium, fentaNYL (SUBLIMAZE) injection, hydrALAZINE, hydrOXYzine, melatonin, morphine injection, morphine injection, ondansetron (ZOFRAN) IV, ondansetron (ZOFRAN) IV, ondansetron (ZOFRAN) IV, oxyCODONE, oxyCODONE-acetaminophen, senna-docusate, sodium chloride flush, tetrahydrozoline  Time spent: 35 minutes  Author: Val Riles. MD [facility] 06/05/2022 1:02 PM  To reach On-call, see care teams to locate the attending and reach out to them via [email or web address]. If 7PM-7AM, please contact night-coverage If you still have difficulty reaching the attending provider, please page the [facility] (Director on Call) for [facility] on amion for assistance.

## 2022-06-05 NOTE — Progress Notes (Signed)
SUBJECTIVE: Sleeping comfortably   Vitals:   06/05/22 0451 06/05/22 0500 06/05/22 0753 06/05/22 0954  BP: 115/71  133/79 (!) 108/59  Pulse: 92  92 (!) 101  Resp: '17  18 16  '$ Temp: 98.2 F (36.8 C)  98.5 F (36.9 C) 97.8 F (36.6 C)  TempSrc: Oral  Oral   SpO2: 90%  93% 92%  Weight:  61.4 kg    Height:        Intake/Output Summary (Last 24 hours) at 06/05/2022 1259 Last data filed at 06/05/2022 1012 Gross per 24 hour  Intake 680 ml  Output 5200 ml  Net -4520 ml    LABS: Basic Metabolic Panel: Recent Labs    06/04/22 0630 06/05/22 0448  NA 131* 132*  K 3.7 3.8  CL 97* 97*  CO2 26 28  GLUCOSE 96 97  BUN 19 20  CREATININE 0.62 0.67  CALCIUM 8.7* 9.1  MG 1.9 2.1  PHOS 3.0 3.4   Liver Function Tests: No results for input(s): "AST", "ALT", "ALKPHOS", "BILITOT", "PROT", "ALBUMIN" in the last 72 hours. No results for input(s): "LIPASE", "AMYLASE" in the last 72 hours. CBC: Recent Labs    06/04/22 0317 06/05/22 0448  WBC 11.2* 10.6*  HGB 10.0* 10.5*  HCT 32.1* 33.3*  MCV 87.7 87.9  PLT 212 230   Cardiac Enzymes: No results for input(s): "CKTOTAL", "CKMB", "CKMBINDEX", "TROPONINI" in the last 72 hours. BNP: Invalid input(s): "POCBNP" D-Dimer: No results for input(s): "DDIMER" in the last 72 hours. Hemoglobin A1C: No results for input(s): "HGBA1C" in the last 72 hours. Fasting Lipid Panel: No results for input(s): "CHOL", "HDL", "LDLCALC", "TRIG", "CHOLHDL", "LDLDIRECT" in the last 72 hours. Thyroid Function Tests: No results for input(s): "TSH", "T4TOTAL", "T3FREE", "THYROIDAB" in the last 72 hours.  Invalid input(s): "FREET3" Anemia Panel: No results for input(s): "VITAMINB12", "FOLATE", "FERRITIN", "TIBC", "IRON", "RETICCTPCT" in the last 72 hours.   PHYSICAL EXAM General: Well developed, well nourished, in no acute distress HEENT:  Normocephalic and atramatic Neck:  No JVD.  Lungs: Clear bilaterally to auscultation and percussion. Heart: HRRR .  Normal S1 and S2 without gallops or murmurs.  Abdomen: Bowel sounds are positive, abdomen soft and non-tender  Msk:  Back normal, normal gait. Normal strength and tone for age. Extremities: No clubbing, cyanosis or edema.   Neuro: Alert and oriented X 3. Psych:  Good affect, responds appropriately  TELEMETRY: Normal sinus rhythm 85 bpm  ASSESSMENT AND PLAN: Left foot infection with severe peripheral vascular disease probably has a 60% chance of having coronary artery disease.  But denies any chest pain or shortness of breath.  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  Even though patient is moderate to high risk advise proceeding with vascular surgery as there are very limited options.  Principal Problem:   Left foot infection Active Problems:   COPD (chronic obstructive pulmonary disease) (HCC)   Coronary artery disease   Tobacco abuse   Essential hypertension   Chronic combined systolic and diastolic heart failure (H[facility])   PAD (peripheral artery disease) (HCC)   HLD (hyperlipidemia)   Depression with anxiety   Leg edema, right   Hemiparesis affecting left side as late effect of cerebrovascular accident (CVA) ([place])    Neoma Laming, MD, [facility] 06/05/2022 12:59 PM

## 2022-06-05 NOTE — Progress Notes (Signed)
Physical Therapy Treatment Patient Details Name: Michele Matthews MRN: [id number] DOB: 1945/07/03 Today's Date: 06/05/2022   History of Present Illness Michele Matthews is a 77 y.o. 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  She was found to have a left heel wound with infection. Vascular consulted with plan for angiogram, at risk for amputation. Pt has been to rehab after past admission, generally negative experience per family with perceived decline in function by time to DC.    PT Comments    Pt in bed curled on right side.  She is agreeable to session but somewhat difficulty to direct.  She is assisted to sitting with mod a x 1.  Once fully to EOB she is generally steady but does need verbal and tactile cues to sit EOB without initiating return to supine.  Seated AROM is encouraged but limited by pt and needs tactile cues to complete.  Attempted to don post op shoe but she declined stating it causes too much pain.  She is encouraged to lateral scoot in sitting but has difficulty as she resists putting her feet on the floor and then transitions to supine on her own with assist to manage BLE's and position for comfort.  Max a to scoot up in bed in supine.   Recommendations for follow up therapy are one component of a multi-disciplinary discharge planning process, led by the attending physician.  Recommendations may be updated based on patient status, additional functional criteria and insurance authorization.  Follow Up Recommendations  Skilled nursing-short term rehab (<3 hours/day) Can patient physically be transported by private vehicle: No   Assistance Recommended at Discharge Intermittent Supervision/Assistance  Patient can return home with the following Two people to help with walking  and/or transfers;Two people to help with bathing/dressing/bathroom;Assistance with cooking/housework;Assistance with feeding;Direct supervision/assist for medications management;Assist for transportation;Help with stairs or ramp for entrance   Equipment Recommendations  BSC/3in1    Recommendations for Other Services       Precautions / Restrictions Precautions Precautions: Fall Restrictions Weight Bearing Restrictions: Yes LLE Weight Bearing: Weight bearing as tolerated Other Position/Activity Restrictions: in surgical shoe     Mobility  Bed Mobility Overal bed mobility: Needs Assistance Bed Mobility: Supine to Sit, Sit to Supine     Supine to sit: Mod assist Sit to supine: Mod assist        Transfers                   General transfer comment: pt deferred    Ambulation/Gait               General Gait Details: deferred   Stairs             Wheelchair Mobility    Modified Rankin (Stroke Patients Only)       Balance Overall balance assessment: Needs assistance Sitting-balance support: Single extremity supported, Bilateral upper extremity supported Sitting balance-Leahy Scale: Fair Sitting balance - Comments: verbal and tactile cues to remain upright.                                    Cognition Arousal/Alertness: Awake/alert Behavior During Therapy: WFL for tasks assessed/performed Overall Cognitive Status: No family/caregiver present to determine baseline  cognitive functioning                                          Exercises Other Exercises Other Exercises: seated BLE AROM to tolerance    General Comments        Pertinent Vitals/Pain Pain Assessment Pain Assessment: Faces Faces Pain Scale: Hurts even more Pain Location: L foot - stated donning post op shoe and space boot makes pain worse. Pain Descriptors / Indicators: Aching, Sore Pain Intervention(s): Limited activity within patient's  tolerance, Monitored during session, Repositioned    Home Living                          Prior Function            PT Goals (current goals can now be found in the care plan section) Progress towards PT goals: Not progressing toward goals - comment    Frequency    Min 2X/week      PT Plan Current plan remains appropriate    Co-evaluation              AM-PAC PT "6 Clicks" Mobility   Outcome Measure  Help needed turning from your back to your side while in a flat bed without using bedrails?: A Little Help needed moving from lying on your back to sitting on the side of a flat bed without using bedrails?: A Lot Help needed moving to and from a bed to a chair (including a wheelchair)?: A Lot Help needed standing up from a chair using your arms (e.g., wheelchair or bedside chair)?: Total Help needed to walk in hospital room?: Total Help needed climbing 3-5 steps with a railing? : Total 6 Click Score: 10    End of Session Equipment Utilized During Treatment: Gait belt Activity Tolerance: Patient limited by fatigue Patient left: in bed;with bed alarm set;with call bell/phone within reach Nurse Communication: Mobility status;Weight bearing status PT Visit Diagnosis: Unsteadiness on feet (R26.81);Other abnormalities of gait and mobility (R26.89);Muscle weakness (generalized) (M62.81);Difficulty in walking, not elsewhere classified (R26.2);Pain Pain - Right/Left: Left Pain - part of body: Ankle and joints of foot     Time: 2706-2376 PT Time Calculation (min) (ACUTE ONLY): 9 min  Charges:  $Therapeutic Activity: 8-22 mins                    Chesley Noon, PTA 06/05/22, 12:35 PM

## 2022-06-06 DIAGNOSIS — L089 Local infection of the skin and subcutaneous tissue, unspecified: Secondary | ICD-10-CM | POA: Diagnosis not present

## 2022-06-06 LAB — CBC
HCT: 33 % — ABNORMAL LOW (ref 36.0–46.0)
Hemoglobin: 10.5 g/dL — ABNORMAL LOW (ref 12.0–15.0)
MCH: 27.6 pg (ref 26.0–34.0)
MCHC: 31.8 g/dL (ref 30.0–36.0)
MCV: 86.6 fL (ref 80.0–100.0)
Platelets: 263 10*3/uL (ref 150–400)
RBC: 3.81 MIL/uL — ABNORMAL LOW (ref 3.87–5.11)
RDW: 15.8 % — ABNORMAL HIGH (ref 11.5–15.5)
WBC: 9.8 10*3/uL (ref 4.0–10.5)
nRBC: 0 % (ref 0.0–0.2)

## 2022-06-06 LAB — BASIC METABOLIC PANEL
Anion gap: 9 (ref 5–15)
BUN: 26 mg/dL — ABNORMAL HIGH (ref 8–23)
CO2: 27 mmol/L (ref 22–32)
Calcium: 9.1 mg/dL (ref 8.9–10.3)
Chloride: 97 mmol/L — ABNORMAL LOW (ref 98–111)
Creatinine, Ser: 0.7 mg/dL (ref 0.44–1.00)
GFR, Estimated: >60 mL/min (ref >60)
Glucose, Bld: 91 mg/dL (ref 70–99)
Potassium: 4.2 mmol/L (ref 3.5–5.1)
Sodium: 133 mmol/L — ABNORMAL LOW (ref 135–145)

## 2022-06-06 LAB — GLUCOSE, CAPILLARY: Glucose-Capillary: 126 mg/dL — ABNORMAL HIGH (ref 70–99)

## 2022-06-06 NOTE — TOC Progression Note (Signed)
Transition of Care [facility]) - Progression Note    Patient Details  Name: Michele Matthews MRN: [id number] Date of Birth: [birth year]  Transition of Care [facility]) CM/SW Contact  Beverly Sessions, RN Phone Number: 06/06/2022, 11:27 AM  Clinical Narrative:    Per MD note "patient and family decided for LLE bypass, vascular surgery is aware, waiting for surgical plan. "        Expected Discharge Plan and Services                                               Social Determinants of Health (SDOH) Interventions SDOH Screenings   Food Insecurity: No Food Insecurity (05/26/2022)  Housing: Low Risk  (05/26/2022)  Transportation Needs: No Transportation Needs (05/26/2022)  Utilities: Not At Risk (05/26/2022)  Financial Resource Strain: Medium Risk (11/08/2021)  Tobacco Use: High Risk (05/31/2022)    Readmission Risk Interventions     No data to display

## 2022-06-06 NOTE — Plan of Care (Signed)
  Problem: Health Behavior/Discharge Planning: Goal: Ability to manage health-related needs will improve Outcome: Progressing   

## 2022-06-06 NOTE — Progress Notes (Signed)
Progress Note    06/06/2022 11:33 AM 6 Days Post-Op  Subjective:  Michele Matthews is a 77 yo female who POD #6 S/P Left lower extremity angiography. Percutaneous transluminal angioplasty and stent placement left external iliac artery.    Patient is resting comfortably in bed this morning. Denies any pain upon movement or at rest to her left lower extremity. Not as agitated today. States she agrees to have bypass surgery. No complaints overnight and vitals all remain stable.     Vitals:   06/06/22 0417 06/06/22 0807  BP: (!) 135/57 (!) 123/57  Pulse: 98 84  Resp: 17 16  Temp: 98.4 F (36.9 C) 98.1 F (36.7 C)  SpO2: 92%    Physical Exam: Cardiac:  RRR Lungs:  Clear Throughout. Non labored. 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    Abdomen:   Positive bowel sounds, soft, flat NT/ND   Neurologic: AAOX3 Less Agitated today   CBC    Component Value Date/Time   WBC 9.8 06/06/2022 0330   RBC 3.81 (L) 06/06/2022 0330   HGB 10.5 (L) 06/06/2022 0330   HGB 12.1 03/27/2014 1730   HCT 33.0 (L) 06/06/2022 0330   HCT 37.0 03/27/2014 1730   PLT 263 06/06/2022 0330   PLT 175 03/27/2014 1730   MCV 86.6 06/06/2022 0330   MCV 97 03/27/2014 1730   MCH 27.6 06/06/2022 0330   MCHC 31.8 06/06/2022 0330   RDW 15.8 (H) 06/06/2022 0330   RDW 13.3 03/27/2014 1730   LYMPHSABS 1.2 05/26/2022 1354   LYMPHSABS 1.9 11/25/2013 0401   MONOABS 0.6 05/26/2022 1354   MONOABS 0.7 11/25/2013 0401   EOSABS 0.2 05/26/2022 1354   EOSABS 0.2 11/25/2013 0401   BASOSABS 0.0 05/26/2022 1354   BASOSABS 0.0 11/25/2013 0401    BMET    Component Value Date/Time   NA 133 (L) 06/06/2022 0330   NA 140 03/28/2014 0441   K 4.2 06/06/2022 0330   K 4.0 03/28/2014 0441   CL 97 (L) 06/06/2022 0330   CL 110 (H) 03/28/2014 0441   CO2 27 06/06/2022 0330   CO2 24 03/28/2014 0441    GLUCOSE 91 06/06/2022 0330   GLUCOSE 86 03/28/2014 0441   BUN 26 (H) 06/06/2022 0330   BUN 19 (H) 03/28/2014 0441   CREATININE 0.70 06/06/2022 0330   CREATININE 0.79 11/24/2015 0908   CALCIUM 9.1 06/06/2022 0330   CALCIUM 8.7 03/28/2014 0441   GFRNONAA >60 06/06/2022 0330   GFRNONAA 76 11/24/2015 0908   GFRAA >60 07/28/2017 0507   GFRAA 87 11/24/2015 0908    INR    Component Value Date/Time   INR 1.0 05/26/2022 1354   INR 2.0 03/30/2014 0336     Intake/Output Summary (Last 24 hours) at 06/06/2022 1133 Last data filed at 06/06/2022 0608 Gross per 24 hour  Intake 580 ml  Output 850 ml  Net -270 ml     Assessment/Plan:  77 y.o. female is s/p eft lower extremity angiography. Percutaneous transluminal angioplasty and stent placement left external iliac artery.  6 Days Post-Op   PLAN: On 06/04/22 as per podiatry, antibiotics can be changed to oral doxycycline for 10 days.   On 06/05/22 patient and family decided for LLE bypass, vascular surgery is aware, waiting for surgical plan. DVT prophylaxis:  Plavix 75 MG QD and Heparin 5000 Units SQ    Drema Pry  [facility] 06/06/2022 11:33 AM

## 2022-06-06 NOTE — Progress Notes (Signed)
SUBJECTIVE: Breathing Problem She complains of difficulty breathing. The current episode started 1 to 4 weeks ago. The problem occurs 2 to 4 times per day.      Vitals:   06/06/22 0019 06/06/22 0417 06/06/22 0500 06/06/22 0807  BP: 124/70 (!) 135/57  (!) 123/57  Pulse: (!) 108 98  84  Resp: '16 17  16  '$ Temp: 100.2 F (37.9 C) 98.4 F (36.9 C)  98.1 F (36.7 C)  TempSrc: Oral Oral  Oral  SpO2: 92% 92%    Weight:   59 kg   Height:        Intake/Output Summary (Last 24 hours) at 06/06/2022 1403 Last data filed at 06/06/2022 9201 Gross per 24 hour  Intake 340 ml  Output 850 ml  Net -510 ml    LABS: Basic Metabolic Panel: Recent Labs    06/04/22 0630 06/05/22 0448 06/06/22 0330  NA 131* 132* 133*  K 3.7 3.8 4.2  CL 97* 97* 97*  CO2 '26 28 27  '$ GLUCOSE 96 97 91  BUN 19 20 26*  CREATININE 0.62 0.67 0.70  CALCIUM 8.7* 9.1 9.1  MG 1.9 2.1  --   PHOS 3.0 3.4  --    Liver Function Tests: No results for input(s): "AST", "ALT", "ALKPHOS", "BILITOT", "PROT", "ALBUMIN" in the last 72 hours. No results for input(s): "LIPASE", "AMYLASE" in the last 72 hours. CBC: Recent Labs    06/05/22 0448 06/06/22 0330  WBC 10.6* 9.8  HGB 10.5* 10.5*  HCT 33.3* 33.0*  MCV 87.9 86.6  PLT 230 263   Cardiac Enzymes: No results for input(s): "CKTOTAL", "CKMB", "CKMBINDEX", "TROPONINI" in the last 72 hours. BNP: Invalid input(s): "POCBNP" D-Dimer: No results for input(s): "DDIMER" in the last 72 hours. Hemoglobin A1C: No results for input(s): "HGBA1C" in the last 72 hours. Fasting Lipid Panel: No results for input(s): "CHOL", "HDL", "LDLCALC", "TRIG", "CHOLHDL", "LDLDIRECT" in the last 72 hours. Thyroid Function Tests: No results for input(s): "TSH", "T4TOTAL", "T3FREE", "THYROIDAB" in the last 72 hours.  Invalid input(s): "FREET3" Anemia Panel: No results for input(s): "VITAMINB12", "FOLATE", "FERRITIN", "TIBC", "IRON", "RETICCTPCT" in the last 72 hours.   PHYSICAL EXAM General:  Well developed, well nourished, in no acute distress HEENT:  Normocephalic and atramatic Neck:  No JVD.  Lungs: Clear bilaterally to auscultation and percussion. Heart: HRRR . Normal S1 and S2 without gallops or murmurs.  Abdomen: Bowel sounds are positive, abdomen soft and non-tender  Msk:  Back normal, normal gait. Normal strength and tone for age. Extremities: No clubbing, cyanosis or edema.   Neuro: Alert and oriented X 3. Psych:  Good affect, responds appropriately  TELEMETRY: NSR  ASSESSMENT AND PLAN: Cellulitis of left foot Cellulitis of left foot . Awaiting surgery.  Principal Problem:   Left foot infection Active Problems:   COPD (chronic obstructive pulmonary disease) (HCC)   Coronary artery disease   Tobacco abuse   Essential hypertension   Chronic combined systolic and diastolic heart failure (HCC)   PAD (peripheral artery disease) (HCC)   HLD (hyperlipidemia)   Depression with anxiety   Leg edema, right   Hemiparesis affecting left side as late effect of cerebrovascular accident (CVA) ([place])    Neoma Laming, MD, [facility] 06/06/2022 2:03 PM

## 2022-06-06 NOTE — Progress Notes (Signed)
Physical Therapy Treatment Patient Details Name: Michele Matthews MRN: [id number] DOB: 15-Sep-1945 Today's Date: 06/06/2022   History of Present Illness Michele Matthews is a 77 y.o. 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  She was found to have a left heel wound with infection. Vascular consulted with plan for angiogram, at risk for amputation. Pt has been to rehab after past admission, generally negative experience per family with perceived decline in function by time to DC.    PT Comments    Supine AAROM x 10 with constant verbal and tactile cues to complete.  She is able to transition to EOB with min a x 1 and remain sitting with distant supervision today.  She does have better motor planning for attempts to lateral scoot but it remains quite challenging for her.  Seated AAROM x 10. She is able to initiate transition back to supine but does need min a x 1 to get legs fully on bed.    Overall improvements in mobility today but remains quite limited.  Pt refuses to try standing when asked "I'm not standing up."     Recommendations for follow up therapy are one component of a multi-disciplinary discharge planning process, led by the attending physician.  Recommendations may be updated based on patient status, additional functional criteria and insurance authorization.  Follow Up Recommendations  Skilled nursing-short term rehab (<3 hours/day)     Assistance Recommended at Discharge Intermittent Supervision/Assistance  Patient can return home with the following Two people to help with walking and/or transfers;Two people to help with bathing/dressing/bathroom;Assistance with cooking/housework;Assistance with feeding;Direct supervision/assist for medications management;Assist for  transportation;Help with stairs or ramp for entrance   Equipment Recommendations  BSC/3in1    Recommendations for Other Services       Precautions / Restrictions Precautions Precautions: Fall Restrictions Weight Bearing Restrictions: Yes LLE Weight Bearing: Weight bearing as tolerated Other Position/Activity Restrictions: in surgical shoe     Mobility  Bed Mobility Overal bed mobility: Needs Assistance Bed Mobility: Supine to Sit, Sit to Supine     Supine to sit: Mod assist Sit to supine: Min assist   General bed mobility comments: more effort today during session but voicing frustration over challenges    Transfers                   General transfer comment: refused .  "I'm not standing up"    Ambulation/Gait                   Stairs             Wheelchair Mobility    Modified Rankin (Stroke Patients Only)       Balance Overall balance assessment: Needs assistance Sitting-balance support: Single extremity supported, Bilateral upper extremity supported Sitting balance-Leahy Scale: Fair Sitting balance - Comments: able to maintian sitting with distant supervision today.                                    Cognition Arousal/Alertness: Awake/alert Behavior During Therapy: WFL for tasks assessed/performed Overall Cognitive Status: No family/caregiver present to determine baseline cognitive functioning  Exercises Other Exercises Other Exercises: supine AAROM and seated AROM with cues    General Comments        Pertinent Vitals/Pain Pain Assessment Pain Assessment: Faces Faces Pain Scale: Hurts little more Pain Location: L foot - stated donning post op shoe and space boot makes pain worse. Pain Descriptors / Indicators: Aching, Sore Pain Intervention(s): Limited activity within patient's tolerance, Monitored during session, Repositioned    Home Living                           Prior Function            PT Goals (current goals can now be found in the care plan section) Progress towards PT goals: Progressing toward goals    Frequency    Min 2X/week      PT Plan Current plan remains appropriate    Co-evaluation              AM-PAC PT "6 Clicks" Mobility   Outcome Measure  Help needed turning from your back to your side while in a flat bed without using bedrails?: A Little Help needed moving from lying on your back to sitting on the side of a flat bed without using bedrails?: A Lot Help needed moving to and from a bed to a chair (including a wheelchair)?: Total Help needed standing up from a chair using your arms (e.g., wheelchair or bedside chair)?: Total Help needed to walk in hospital room?: Total Help needed climbing 3-5 steps with a railing? : Total 6 Click Score: 9    End of Session   Activity Tolerance: Patient limited by fatigue Patient left: in bed;with bed alarm set;with call bell/phone within reach Nurse Communication: Mobility status;Weight bearing status PT Visit Diagnosis: Unsteadiness on feet (R26.81);Other abnormalities of gait and mobility (R26.89);Muscle weakness (generalized) (M62.81);Difficulty in walking, not elsewhere classified (R26.2);Pain Pain - Right/Left: Left Pain - part of body: Ankle and joints of foot     Time: 8309-4076 PT Time Calculation (min) (ACUTE ONLY): 15 min  Charges:  $Gait Training: 8-22 mins                    Chesley Noon, PTA 06/06/22, 11:15 AM

## 2022-06-06 NOTE — Progress Notes (Signed)
[facility] Progress Note  [name]    TKW:[id number]  DOA: 05/26/2022     Date of Service: the patient was seen and examined on 06/06/2022  Chief Complaint  Patient presents with   Wound Infection   Brief hospital course: CARLIA BOMKAMP is a 77 y.o. 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  She was found to have a left heel wound with infection.  Podiatry consult obtained, patient started antibiotics with Rocephin and vancomycin. PAD, seen by vascular surgery, scheduled surgery today.     Assessment and Plan: Left foot infection:  Peripheral arterial disease. S/p left foot TMA done on 11/30/2020 No evidence of sepsis.  Patient has been seen by podiatry, s/p vancomycin, Flagyl and Rocephin.  Transition to doxycycline for 10 days on 06/04/22 Patient was not able to tolerate MRI even give her dose of Ativan.  Currently pending ABI test to determine the next course of action.  Given high dose of oxycodone prior to the ABI testing as patient has unable to tolerate. Discussed with Dr. Posey Pronto, patient does not need debridement.  Recommend antibiotic treatment and follow-up with wound care. Vascular surgery consulted, s/p LLE angiogram,  Diffuse atherosclerotic changes of the left lower extremity, occlusion of the SFA stents as well as the SFA and the popliteal in its entirety.  There appears to be anterior tibial runoff however the this does not appear to cross the ankle. Continue Plavix Wound care RN consulted 2/1 d/w vascular surgery, patient is a bypass surgery which will take around 5 hours. 2/3 as per podiatry, antibiotics can be changed to oral doxycycline for 10 days.   2/4 patient and family decided for LLE bypass, vascular surgery is aware, waiting for surgical  plan.   Chronic combined systolic and diastolic heart failure ([place]): 2D echo on 07/24/2017 showed EF 25 to 30% with grade 2 diastolic dysfunction.  Patient does not have any short of breath, BNP 284, patient does not have exacerbation congestive heart failure.   Appear euvolemic. Continued Lasix 20 mg twice daily, Coreg 3.125 twice daily, lisinopril 5 mg twice daily home dose. 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  Right atrium moderately dilated. 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 For  myxoma, just needs to be monitored if no symptoms.  And can follow-up as an outpatient with cardiology for further management plans down the road.   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   COPD (chronic obstructive pulmonary disease): Stable   History of stroke with left hemiparesis. Continue home medicines.  1/31 c/o right-sided weakness since admission which is new 1/31 CT head: Hypodensity in the left aspect of the pons and midbrain may reflect artifact; however, acute infarct is not excluded. Recommend brain MRI for further evaluation. Patient is refusing MRI, cannot tolerate it  Continue monitor on telemetry, continue neurocheck every 4 hourly Cautions fall and aspiration precautions Ambulate with assistance, frequent turning every 8 hourly TSH level 3.15 2/1 D/w patient's husband over the phone that if he is still concerned then we can repeat CT  scan of the head.  He will let us know.  Patient is unable to understand her medical situation.  Isotonic hyponatremia Serum osmolality 285 Na 131--132--133 Monitor sodium level daily   Coronary artery disease:  S/p Plavix 300 po once, followed by Plavix 75 mg p.o. daily   Essential  hypertension On Coreg and lisinopril   Depression with anxiety -Continue home medications 2/1 started Xanax as needed  Tobacco abuse -nicotine patch Advised to quit.    Body mass index is 21.94 kg/m.  Interventions:       Diet: Heart healthy diet DVT Prophylaxis: Subcutaneous Heparin    Advance goals of care discussion: Full code  Family Communication: family was not present at bedside, at the time of interview.  The pt provided permission to discuss medical plan with the family. Opportunity was given to ask question and all questions were answered satisfactorily.  2/2 discussed with patient's husband over the phone. 2/3 discussed with patient's husband and her daughter over the phone 2/4 d/w patient husband over the phone, agreed to consult palliative care but wanted her to be full code for now  Disposition:  Pt is from Home, admitted with left foot infection, peripheral vascular disease, still has PVD, vascular surgery following, needs LLE bypass sx, which precludes a safe discharge. Discharge to SNF vs HH, when clinically stable. 2/3 patient's family visited her and decided to move forward for LLE bypass surgery.  Vascular surgery was informed, waiting for surgical plan.     Subjective: No significant events overnight, patient stated that she is having difficulty hearing, after talking loudly she was able to understand.,  Patient stated that she is feeling all right, she was asking about surgical procedure will be done.  I informed that it depends on vascular surgery.    Physical Exam: General: NAD, resting comfortably, seems anxious and confused Appear in no distress, affect appropriate Eyes: PERRLA ENT: Oral Mucosa Clear, moist  Neck: no JVD,  Cardiovascular: S1 and S2 Present, no Murmur,  Respiratory: good respiratory effort, Bilateral Air entry equal and Decreased, no Crackles, no wheezes Abdomen: Bowel Sound present, Soft and no tenderness,  Skin: no  rashes Extremities: no Pedal edema, no calf tenderness, left foot dressing CDI Neurologic: Residual weakness on the left side.  Right arm no weakness, right leg is weak. Gait not checked due to patient safety concerns  Vitals:   06/06/22 0019 06/06/22 0417 06/06/22 0500 06/06/22 0807  BP: 124/70 (!) 135/57  (!) 123/57  Pulse: (!) 108 98  84  Resp: '16 17  16  '$ Temp: 100.2 F (37.9 C) 98.4 F (36.9 C)  98.1 F (36.7 C)  TempSrc: Oral Oral  Oral  SpO2: 92% 92%    Weight:   59 kg   Height:        Intake/Output Summary (Last 24 hours) at 06/06/2022 1406 Last data filed at 06/06/2022 6195 Gross per 24 hour  Intake 340 ml  Output 850 ml  Net -510 ml   Filed Weights   06/04/22 0429 06/05/22 0500 06/06/22 0500  Weight: 61 kg 61.4 kg 59 kg    Data Reviewed: I have personally reviewed and interpreted daily labs, tele strips, imagings as discussed above. I reviewed all nursing notes, pharmacy notes, vitals, pertinent old records I have discussed plan of care as described above with RN and patient/family.  CBC: Recent Labs  Lab 06/02/22 0530 06/03/22 0321 06/04/22 0317 06/05/22 0448 06/06/22 0330  WBC 10.3 10.3 11.2* 10.6* 9.8  HGB 10.1* 10.0* 10.0* 10.5* 10.5*  HCT 32.0* 31.9* 32.1* 33.3* 33.0*  MCV 88.9 87.9 87.7 87.9 86.6  PLT 208 214 212 230 361   Basic Metabolic Panel: Recent Labs  Lab 06/01/22 0918 06/02/22 0530 06/03/22 0321 06/04/22 0630 06/05/22 0448 06/06/22 0330  NA 136 138 135 131* 132* 133*  K 3.7 3.6 3.5 3.7 3.8 4.2  CL 102 104 100 97* 97* 97*  CO2 '25 27 27 26 28 27  '$ GLUCOSE 98 137* 94 96 97 91  BUN '15 21 18 19 20 '$ 26*  CREATININE 0.62 0.72 0.60 0.62 0.67 0.70  CALCIUM 8.7* 8.6* 8.4* 8.7* 9.1 9.1  MG 2.0 1.7 2.1 1.9 2.1  --   PHOS 3.0 2.4* 3.0 3.0 3.4  --     Studies: No results found.  Scheduled Meds:  atorvastatin  20 mg Oral Daily   busPIRone  7.5 mg Oral BID   carvedilol  3.125 mg Oral BID WC   Chlorhexidine Gluconate Cloth  6 each Topical  Once   clopidogrel  75 mg Oral Q breakfast   doxycycline  100 mg Oral Q12H   feeding supplement  237 mL Oral TID BM   furosemide  20 mg Oral Daily   heparin  5,000 Units Subcutaneous Q8H   lisinopril  5 mg Oral Daily   multivitamin with minerals  1 tablet Oral Daily   nicotine  21 mg Transdermal Daily   pantoprazole  20 mg Oral Daily   sertraline  100 mg Oral Daily   sodium chloride flush  3 mL Intravenous Q12H   Continuous Infusions:  sodium chloride     PRN Meds: sodium chloride, acetaminophen, acetaminophen, albuterol, ALPRAZolam, dextromethorphan-guaiFENesin, diclofenac Sodium, fentaNYL (SUBLIMAZE) injection, hydrALAZINE, hydrOXYzine, melatonin, morphine injection, morphine injection, ondansetron (ZOFRAN) IV, ondansetron (ZOFRAN) IV, ondansetron (ZOFRAN) IV, oxyCODONE, oxyCODONE-acetaminophen, senna-docusate, sodium chloride flush, tetrahydrozoline  Time spent: 35 minutes  Author: Val Riles. MD [facility] 06/06/2022 2:06 PM  To reach On-call, see care teams to locate the attending and reach out to them via [email or web address]. If 7PM-7AM, please contact night-coverage If you still have difficulty reaching the attending provider, please page the [facility] (Director on Call) for [facility] on amion for assistance.

## 2022-06-07 DIAGNOSIS — L089 Local infection of the skin and subcutaneous tissue, unspecified: Secondary | ICD-10-CM | POA: Diagnosis not present

## 2022-06-07 DIAGNOSIS — Z720 Tobacco use: Secondary | ICD-10-CM | POA: Diagnosis not present

## 2022-06-07 DIAGNOSIS — I739 Peripheral vascular disease, unspecified: Secondary | ICD-10-CM | POA: Diagnosis not present

## 2022-06-07 DIAGNOSIS — I69354 Hemiplegia and hemiparesis following cerebral infarction affecting left non-dominant side: Secondary | ICD-10-CM | POA: Diagnosis not present

## 2022-06-07 DIAGNOSIS — Z515 Encounter for palliative care: Secondary | ICD-10-CM

## 2022-06-07 LAB — BASIC METABOLIC PANEL
Anion gap: 8 (ref 5–15)
BUN: 27 mg/dL — ABNORMAL HIGH (ref 8–23)
CO2: 28 mmol/L (ref 22–32)
Calcium: 9.3 mg/dL (ref 8.9–10.3)
Chloride: 97 mmol/L — ABNORMAL LOW (ref 98–111)
Creatinine, Ser: 0.76 mg/dL (ref 0.44–1.00)
GFR, Estimated: >60 mL/min (ref >60)
Glucose, Bld: 112 mg/dL — ABNORMAL HIGH (ref 70–99)
Potassium: 4.1 mmol/L (ref 3.5–5.1)
Sodium: 133 mmol/L — ABNORMAL LOW (ref 135–145)

## 2022-06-07 LAB — CBC
HCT: 33.8 % — ABNORMAL LOW (ref 36.0–46.0)
Hemoglobin: 10.6 g/dL — ABNORMAL LOW (ref 12.0–15.0)
MCH: 27.4 pg (ref 26.0–34.0)
MCHC: 31.4 g/dL (ref 30.0–36.0)
MCV: 87.3 fL (ref 80.0–100.0)
Platelets: 297 10*3/uL (ref 150–400)
RBC: 3.87 MIL/uL (ref 3.87–5.11)
RDW: 15.8 % — ABNORMAL HIGH (ref 11.5–15.5)
WBC: 11.1 10*3/uL — ABNORMAL HIGH (ref 4.0–10.5)
nRBC: 0 % (ref 0.0–0.2)

## 2022-06-07 NOTE — Consult Note (Signed)
Consultation Note Date: 06/07/2022 at 1100  Patient Name: Michele Matthews  DOB: 11/16/1945  MRN: [id number]  Age / Sex: 77 y.o., female  PCP: Dion Body, MD Referring Physician: Val Riles, MD  Reason for Consultation: Establishing goals of care  HPI/Patient Profile: 77 y.o. female  with past medical history of HTN, HLD, COPD, CAD, PAD s/p of transmetatarsal amputation of left foot (August 2022), stroke, depression with anxiety, upper GIB, and CHF (EF 25 to 30%) admitted on 05/26/2022 with left foot pain.  Clinical Assessment and Goals of Care: I have reviewed medical records including EPIC notes, labs and imaging, assessed the patient and then met with patient at bedside to discuss diagnosis prognosis, GOC, EOL wishes, disposition and options.  I introduced Palliative Medicine as specialized medical care for people living with serious illness. It focuses on providing relief from the symptoms and stress of a serious illness. The goal is to improve quality of life for both the patient and the family.  We discussed patient's current illness and what it means in the larger context of patient's on-going co-morbidities.  Natural disease trajectory discussed. Patient had many questions in regards to why her leg hurt so much. Education provided on bypass and PAD. Pt is scheduled for bypass on 06/09/22. She says she is in agreement with surgery and just wants to the pain to go away.   Pain assessment completed. Patient endorsed 9/10 pain in left thigh/hip that is throbbing, aching, and burning. She endorses it is intermittent and comes both at rest and with movement. In review of her current medication regimen, patient has not utilized all pain medications available. Advised RN to give both tylenol and oxyIR for pain relief now. Discuss pain regimen with patient.   I attempted to elicit values and goals of care  important to the patient. She speaks of trusting her husband with all of her decision making. She says he told her not to sign anything until she talks with him. Advance directives, concepts specific to code status, artificial feeding and hydration, and rehospitalization were considered and discussed. She shares she would accept CPR and full code status with use of mechnical ventilation. However, she would not be accepting of a tracheostomy or to have her life sustained on machines. She shares if she is to remain on a ventilator/trach for the rest of her life then she would want her family to "pull the plug".   Ongoing support and discussions to continue. PMT will continue to follow.   Primary Decision Maker PATIENT  Physical Exam Vitals reviewed.  Constitutional:      General: She is not in acute distress.    Appearance: She is normal weight. She is not ill-appearing.  HENT:     Head: Normocephalic.     Mouth/Throat:     Mouth: Mucous membranes are moist.  Eyes:     Pupils: Pupils are equal, round, and reactive to light.  Cardiovascular:     Rate and Rhythm: Normal rate.     Pulses:  Normal pulses.  Pulmonary:     Effort: Pulmonary effort is normal.  Abdominal:     Palpations: Abdomen is soft.  Musculoskeletal:     Comments: Generalized weakness  Skin:    General: Skin is warm and dry.  Neurological:     Mental Status: She is alert and oriented to person, place, and time.  Psychiatric:        Mood and Affect: Mood normal.        Behavior: Behavior normal.        Thought Content: Thought content normal.        Judgment: Judgment normal.     Palliative Assessment/Data: 40-50%     Thank you for this consult. Palliative medicine will continue to follow and assist holistically.   Time Total: 75 minutes Greater than 50%  of this time was spent counseling and coordinating care related to the above assessment and plan.  Signed by: Jordan Hawks, DNP, FNP-BC Palliative  Medicine    Please contact Palliative Medicine Team phone at [phone] for questions and concerns.  For individual provider: See Shea Evans

## 2022-06-07 NOTE — Progress Notes (Signed)
[facility] Progress Note  [name]    [id number]  DOA: 05/26/2022     Date of Service: the patient was seen and examined on 06/07/2022  Chief Complaint  Patient presents with   Wound Infection   Brief hospital course: IRAIS MOTTRAM is a 77 y.o. 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  She was found to have a left heel wound with infection.  Podiatry consult obtained, patient started antibiotics with Rocephin and vancomycin. PAD, seen by vascular surgery, scheduled surgery today.     Assessment and Plan: Left foot infection:  Peripheral arterial disease. S/p left foot TMA done on 11/30/2020 No evidence of sepsis.  Patient has been seen by podiatry, s/p vancomycin, Flagyl and Rocephin.  Transition to doxycycline for 10 days on 06/04/22 Patient was not able to tolerate MRI even give her dose of Ativan.  Currently pending ABI test to determine the next course of action.  Given high dose of oxycodone prior to the ABI testing as patient has unable to tolerate. Discussed with Dr. Posey Pronto, patient does not need debridement.  Recommend antibiotic treatment and follow-up with wound care. Vascular surgery consulted, s/p LLE angiogram,  Diffuse atherosclerotic changes of the left lower extremity, occlusion of the SFA stents as well as the SFA and the popliteal in its entirety.  There appears to be anterior tibial runoff however the this does not appear to cross the ankle. Continue Plavix Wound care RN consulted 2/1 d/w vascular surgery, patient is a bypass surgery which will take around 5 hours. 2/3 as per podiatry, antibiotics can be changed to oral doxycycline for 10 days.   2/4 patient and family decided for LLE bypass, vascular surgery is aware, waiting for surgical  plan. 2/6 scheduled for bypass surgery on Thursday morning 06/09/22   Chronic combined systolic and diastolic heart failure ([place]): 2D echo on 07/24/2017 showed EF 25 to 30% with grade 2 diastolic dysfunction.  Patient does not have any short of breath, BNP 284, patient does not have exacerbation congestive heart failure.   Appear euvolemic. Continued Lasix 20 mg twice daily, Coreg 3.125 twice daily, lisinopril 5 mg twice daily home dose. 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  Right atrium moderately dilated. 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 For  myxoma, just needs to be monitored if no symptoms.  And can follow-up as an outpatient with cardiology for further management plans down the road.   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   COPD (chronic obstructive pulmonary disease): Stable   History of stroke with left hemiparesis. Continue home medicines.  1/31 c/o right-sided weakness since admission which is new 1/31 CT head: Hypodensity in the left aspect of the pons and midbrain may reflect artifact; however, acute infarct is not excluded. Recommend brain MRI for further evaluation. Patient is refusing MRI, cannot tolerate it  Continue monitor on telemetry, continue neurocheck every 4 hourly Cautions fall and aspiration precautions Ambulate with assistance, frequent turning every 8 hourly TSH level 3.15 2/1 D/w patient's husband  over the phone that if he is still concerned then we can repeat CT scan of the head.  He will let us know.  Patient is unable to understand her medical situation.  Isotonic hyponatremia Serum osmolality 285 Na 131--132--133 Monitor sodium level daily   Coronary artery  disease:  S/p Plavix 300 po once, followed by Plavix 75 mg p.o. daily   Essential hypertension On Coreg and lisinopril   Depression with anxiety -Continue home medications 2/1 started Xanax as needed  Tobacco abuse -nicotine patch Advised to quit.    Body mass index is 21.94 kg/m.  Interventions:       Diet: Heart healthy diet DVT Prophylaxis: Subcutaneous Heparin    Advance goals of care discussion: Full code  Family Communication: family was not present at bedside, at the time of interview.  The pt provided permission to discuss medical plan with the family. Opportunity was given to ask question and all questions were answered satisfactorily.  2/2 discussed with patient's husband over the phone. 2/3 discussed with patient's husband and her daughter over the phone 2/4 d/w patient husband over the phone, agreed to consult palliative care but wanted her to be full code for now  Disposition:  Pt is from Home, admitted with left foot infection, peripheral vascular disease, still has PVD, vascular surgery following, needs LLE bypass sx, which precludes a safe discharge. Discharge to SNF vs HH, when clinically stable. 2/3 patient's family visited her and decided to move forward for LLE bypass surgery.  Vascular surgery was informed, plan for LLE bypass surgery on 06/09/2022   Subjective: No significant events overnight, patient was sleepy, woke up by calling her name.  Denied any active issues, pain is under control. Patient was curious to know about bypass surgery plan, advised to wait for vascular surgery to round and then we will find out when they are planning for surgery.   Physical Exam: General: NAD, resting comfortably, sleepy Appear in no distress, affect appropriate Eyes: PERRLA ENT: Oral Mucosa Clear, moist  Neck: no JVD,  Cardiovascular: S1 and S2 Present, no Murmur,  Respiratory: good respiratory effort, Bilateral Air entry equal and Decreased, no Crackles, no  wheezes Abdomen: Bowel Sound present, Soft and no tenderness,  Skin: no rashes Extremities: no Pedal edema, no calf tenderness, left foot dressing CDI Neurologic: Residual weakness on the left side.  Right arm no weakness, right leg is weak. Gait not checked due to patient safety concerns  Vitals:   06/06/22 2055 06/07/22 0437 06/07/22 0458 06/07/22 0821  BP: (!) 128/55  124/71 123/62  Pulse: 89  (!) 102 99  Resp: '17  17 18  '$ Temp: 98.7 F (37.1 C)  98.5 F (36.9 C) 98.7 F (37.1 C)  TempSrc: Oral   Oral  SpO2: 94%  92% 92%  Weight:  60.8 kg    Height:        Intake/Output Summary (Last 24 hours) at 06/07/2022 1325 Last data filed at 06/07/2022 0500 Gross per 24 hour  Intake --  Output 1000 ml  Net -1000 ml   Filed Weights   06/05/22 0500 06/06/22 0500 06/07/22 0437  Weight: 61.4 kg 59 kg 60.8 kg    Data Reviewed: I have personally reviewed and interpreted daily labs, tele strips, imagings as discussed above. I reviewed all nursing notes, pharmacy notes, vitals, pertinent old records I have discussed plan of care as described above with RN and patient/family.  CBC: Recent Labs  Lab 06/03/22 0321 06/04/22 0317 06/05/22  0448 06/06/22 0330 06/07/22 1049  WBC 10.3 11.2* 10.6* 9.8 11.1*  HGB 10.0* 10.0* 10.5* 10.5* 10.6*  HCT 31.9* 32.1* 33.3* 33.0* 33.8*  MCV 87.9 87.7 87.9 86.6 87.3  PLT 214 212 230 263 751   Basic Metabolic Panel: Recent Labs  Lab 06/01/22 0918 06/02/22 0530 06/03/22 0321 06/04/22 0630 06/05/22 0448 06/06/22 0330 06/07/22 1049  NA 136 138 135 131* 132* 133* 133*  K 3.7 3.6 3.5 3.7 3.8 4.2 4.1  CL 102 104 100 97* 97* 97* 97*  CO2 '25 27 27 26 28 27 28  '$ GLUCOSE 98 137* 94 96 97 91 112*  BUN '15 21 18 19 20 '$ 26* 27*  CREATININE 0.62 0.72 0.60 0.62 0.67 0.70 0.76  CALCIUM 8.7* 8.6* 8.4* 8.7* 9.1 9.1 9.3  MG 2.0 1.7 2.1 1.9 2.1  --   --   PHOS 3.0 2.4* 3.0 3.0 3.4  --   --     Studies: No results found.  Scheduled Meds:  atorvastatin  20  mg Oral Daily   busPIRone  7.5 mg Oral BID   carvedilol  3.125 mg Oral BID WC   Chlorhexidine Gluconate Cloth  6 each Topical Once   clopidogrel  75 mg Oral Q breakfast   doxycycline  100 mg Oral Q12H   feeding supplement  237 mL Oral TID BM   furosemide  20 mg Oral Daily   heparin  5,000 Units Subcutaneous Q8H   lisinopril  5 mg Oral Daily   multivitamin with minerals  1 tablet Oral Daily   nicotine  21 mg Transdermal Daily   pantoprazole  20 mg Oral Daily   sertraline  100 mg Oral Daily   sodium chloride flush  3 mL Intravenous Q12H   Continuous Infusions:  sodium chloride     PRN Meds: sodium chloride, acetaminophen, acetaminophen, albuterol, ALPRAZolam, dextromethorphan-guaiFENesin, diclofenac Sodium, fentaNYL (SUBLIMAZE) injection, hydrALAZINE, hydrOXYzine, melatonin, morphine injection, morphine injection, ondansetron (ZOFRAN) IV, ondansetron (ZOFRAN) IV, ondansetron (ZOFRAN) IV, oxyCODONE, oxyCODONE-acetaminophen, senna-docusate, sodium chloride flush, tetrahydrozoline  Time spent: 35 minutes  Author: Val Riles. MD [facility] 06/07/2022 1:25 PM  To reach On-call, see care teams to locate the attending and reach out to them via [email or web address]. If 7PM-7AM, please contact night-coverage If you still have difficulty reaching the attending provider, please page the [facility] (Director on Call) for [facility] on amion for assistance.

## 2022-06-07 NOTE — Progress Notes (Signed)
SUBJECTIVE: Patient is a 77 y.o. female with medical history significant of s/p of transmetatarsal amputation of left foot on 11/30/20, PAD, hypertension, hyperlipidemia, COPD, CAD, CHF with EF 25 to 30%, stroke, depression with anxiety, upper GI bleeding, who presented to the ED on 05/26/22 with left foot pain.  She was found to have a left heel wound with infection.  Podiatry consult obtained, patient started antibiotics with Rocephin and vancomycin.    Left lower extremity angiogram completed today revealed diffuse atherosclerotic changes of the left lower extremity, occlusion of the SFA stents as well as the SFA and the popliteal in its entirety, anterior tibial runoff however the this does not appear to cross the ankle.  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   Vitals:   06/06/22 2055 06/07/22 0437 06/07/22 0458 06/07/22 0821  BP: (!) 128/55  124/71 123/62  Pulse: 89  (!) 102 99  Resp: '17  17 18  '$ Temp: 98.7 F (37.1 C)  98.5 F (36.9 C) 98.7 F (37.1 C)  TempSrc: Oral   Oral  SpO2: 94%  92% 92%  Weight:  60.8 kg    Height:        Intake/Output Summary (Last 24 hours) at 06/07/2022 0846 Last data filed at 06/07/2022 0500 Gross per 24 hour  Intake --  Output 1000 ml  Net -1000 ml    LABS: Basic Metabolic Panel: Recent Labs    06/05/22 0448 06/06/22 0330  NA 132* 133*  K 3.8 4.2  CL 97* 97*  CO2 28 27  GLUCOSE 97 91  BUN 20 26*  CREATININE 0.67 0.70  CALCIUM 9.1 9.1  MG 2.1  --   PHOS 3.4  --    Liver Function Tests: No results for input(s): "AST", "ALT", "ALKPHOS", "BILITOT", "PROT", "ALBUMIN" in the last 72 hours. No results for input(s): "LIPASE", "AMYLASE" in the last 72 hours. CBC: Recent Labs    06/05/22 0448 06/06/22 0330  WBC 10.6* 9.8  HGB 10.5* 10.5*  HCT 33.3* 33.0*  MCV 87.9 86.6  PLT  230 263   Cardiac Enzymes: No results for input(s): "CKTOTAL", "CKMB", "CKMBINDEX", "TROPONINI" in the last 72 hours. BNP: Invalid input(s): "POCBNP" D-Dimer: No results for input(s): "DDIMER" in the last 72 hours. Hemoglobin A1C: No results for input(s): "HGBA1C" in the last 72 hours. Fasting Lipid Panel: No results for input(s): "CHOL", "HDL", "LDLCALC", "TRIG", "CHOLHDL", "LDLDIRECT" in the last 72 hours. Thyroid Function Tests: No results for input(s): "TSH", "T4TOTAL", "T3FREE", "THYROIDAB" in the last 72 hours.  Invalid input(s): "FREET3" Anemia Panel: No results for input(s): "VITAMINB12", "FOLATE", "FERRITIN", "TIBC", "IRON", "RETICCTPCT" in the last 72 hours.   PHYSICAL EXAM General: Well developed, well nourished, in no acute distress HEENT:  Normocephalic and atramatic Neck:  No JVD.  Lungs: Clear bilaterally to auscultation and percussion. Heart: HRRR . Normal S1 and S2 without gallops or murmurs.  Abdomen: Bowel sounds are positive, abdomen soft and non-tender  Msk:  Back normal, normal gait. Normal strength and tone for age. Extremities: No clubbing, cyanosis or edema.   Neuro: Alert and oriented X 3. Psych:  Good affect, responds appropriately  TELEMETRY: sinus tachycardia, HR 102 bpm  ASSESSMENT AND PLAN: Patient resting in bed. Denies chest pain. Patient and family have decided for LLE bypass. Awaiting surgery date.   Principal Problem:  Left foot infection Active Problems:   COPD (chronic obstructive pulmonary disease) ([facility])   Coronary artery disease   Tobacco abuse   Essential hypertension   Chronic combined systolic and diastolic heart failure ([facility])   PAD (peripheral artery disease) ([facility])   HLD (hyperlipidemia)   Depression with anxiety   Leg edema, right   Hemiparesis affecting left side as late effect of cerebrovascular accident (CVA) ([place])    Taison Celani, FNP-C 06/07/2022 8:46 AM

## 2022-06-07 NOTE — Progress Notes (Signed)
Progress Note    06/07/2022 11:28 AM 7 Days Post-Op  Subjective:  Michele Matthews is a 77 yo female who POD #6 S/P Left lower extremity angiography. Percutaneous transluminal angioplasty and stent placement left external iliac artery.    Patient is resting comfortably in bed this morning. Denies any pain upon movement or at rest to her left lower extremity. Patient is aware she is scheduled for bypass surgery on Thursday morning 06/09/22. No complaints overnight and vitals all remain stable.     Vitals:   06/07/22 0458 06/07/22 0821  BP: 124/71 123/62  Pulse: (!) 102 99  Resp: 17 18  Temp: 98.5 F (36.9 C) 98.7 F (37.1 C)  SpO2: 92% 92%   Physical Exam: Cardiac:  RRR Lungs: Clear throughout and and nonlabored Incisions: Right groin dressing clean dry and intact without hematoma or seroma Extremities: Left lower extremity with transmetatarsal amputation site and unstageable pressure ulcer to heel.  Both remain the same. Abdomen: Positive bowel sounds, soft, flat, nontender and non-distended.  Neurologic: AAOX3 Answer all questions appropriately  CBC    Component Value Date/Time   WBC 11.1 (H) 06/07/2022 1049   RBC 3.87 06/07/2022 1049   HGB 10.6 (L) 06/07/2022 1049   HGB 12.1 03/27/2014 1730   HCT 33.8 (L) 06/07/2022 1049   HCT 37.0 03/27/2014 1730   PLT 297 06/07/2022 1049   PLT 175 03/27/2014 1730   MCV 87.3 06/07/2022 1049   MCV 97 03/27/2014 1730   MCH 27.4 06/07/2022 1049   MCHC 31.4 06/07/2022 1049   RDW 15.8 (H) 06/07/2022 1049   RDW 13.3 03/27/2014 1730   LYMPHSABS 1.2 05/26/2022 1354   LYMPHSABS 1.9 11/25/2013 0401   MONOABS 0.6 05/26/2022 1354   MONOABS 0.7 11/25/2013 0401   EOSABS 0.2 05/26/2022 1354   EOSABS 0.2 11/25/2013 0401   BASOSABS 0.0 05/26/2022 1354   BASOSABS 0.0 11/25/2013 0401    BMET    Component Value Date/Time   NA 133 (L) 06/06/2022 0330   NA 140 03/28/2014 0441   K 4.2 06/06/2022 0330   K 4.0 03/28/2014 0441   CL 97 (L)  06/06/2022 0330   CL 110 (H) 03/28/2014 0441   CO2 27 06/06/2022 0330   CO2 24 03/28/2014 0441   GLUCOSE 91 06/06/2022 0330   GLUCOSE 86 03/28/2014 0441   BUN 26 (H) 06/06/2022 0330   BUN 19 (H) 03/28/2014 0441   CREATININE 0.70 06/06/2022 0330   CREATININE 0.79 11/24/2015 0908   CALCIUM 9.1 06/06/2022 0330   CALCIUM 8.7 03/28/2014 0441   GFRNONAA >60 06/06/2022 0330   GFRNONAA 76 11/24/2015 0908   GFRAA >60 07/28/2017 0507   GFRAA 87 11/24/2015 0908    INR    Component Value Date/Time   INR 1.0 05/26/2022 1354   INR 2.0 03/30/2014 0336     Intake/Output Summary (Last 24 hours) at 06/07/2022 1128 Last data filed at 06/07/2022 0500 Gross per 24 hour  Intake --  Output 1000 ml  Net -1000 ml     Assessment/Plan:  77 y.o. female is s/p Left lower extremity angiography. Percutaneous transluminal angioplasty and stent placement left external iliac artery.  7 Days Post-Op   Plan: Patient will be taken to the operating room on 06/09/2022 for left lower extremity bypass grafting.  Patient will be made n.p.o. after midnight on 06/09/2022.  She will continue on her antibiotic regime until then.  And surgical procedure with the benefits, risks, and complications have been discussed with the  patient and family.  DVT prophylaxis: Plavix 75 mg daily and heparin 5000 units subcu daily   Drema Pry [facility] 06/07/2022 11:28 AM

## 2022-06-07 NOTE — Progress Notes (Signed)
Physical Therapy Treatment Patient Details Name: Michele Matthews MRN: [id number] DOB: [birth year] Today's Date: 06/07/2022   History of Present Illness Michele Matthews is a 77 y.o. 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  She was found to have a left heel wound with infection. Vascular consulted with plan for angiogram, at risk for amputation. Pt has been to rehab after past admission, generally negative experience per family with perceived decline in function by time to DC.    PT Comments    Pt in bed.  C/o sore bottom.  She is able to transition to EOB with min assist and remain sitting for grooming tasks but does not assist and allows me to comb her hair.  She does some minimal AROM BLE in sitting.  She is encouraged to try standing on RLE but refuses "My husband told me not to stand."  Educated on standing NWB with RLE and walker support but she continued to decline.  She is able to make some lateral scoots in bed but while doing so eventually gets her legs back onto the bed.  She continues to refuse to put RLE on floor to assist with scooting which limits her ability.  She is able to get her legs back on the bed today on her own.  During transition, noted bed pad is wet through and full linen change is required.  Rolling left and right with rails but no assist today.  She is educated at length about alerting staff when she needs to be changed/notices she is wet.  No redness or open areas noted during pericare.  Pt continues to refuse to put her R  foot on the floor for lateral scoot transfers which limits OOB to chair.  Educated on pressure and encourage OOB but she continues to refuse.   Recommendations for follow up therapy are one component of a multi-disciplinary discharge planning  process, led by the attending physician.  Recommendations may be updated based on patient status, additional functional criteria and insurance authorization.  Follow Up Recommendations  Skilled nursing-short term rehab (<3 hours/day) Can patient physically be transported by private vehicle: No   Assistance Recommended at Discharge Intermittent Supervision/Assistance  Patient can return home with the following Two people to help with walking and/or transfers;Two people to help with bathing/dressing/bathroom;Assistance with cooking/housework;Assistance with feeding;Direct supervision/assist for medications management;Assist for transportation;Help with stairs or ramp for entrance   Equipment Recommendations  BSC/3in1    Recommendations for Other Services       Precautions / Restrictions Precautions Precautions: Fall Restrictions Weight Bearing Restrictions: Yes LLE Weight Bearing: Weight bearing as tolerated Other Position/Activity Restrictions: in surgical shoe     Mobility  Bed Mobility Overal bed mobility: Needs Assistance Bed Mobility: Supine to Sit, Sit to Supine     Supine to sit: Min assist Sit to supine: Min assist        Transfers Overall transfer level: Needs assistance Equipment used: Rolling walker (2 wheels) Transfers: Sit to/from Stand             General transfer comment: refused .  "I'm not standing up, my husband told me not to"    Ambulation/Gait                   Stairs  Wheelchair Mobility    Modified Rankin (Stroke Patients Only)       Balance Overall balance assessment: Needs assistance Sitting-balance support: Single extremity supported, Bilateral upper extremity supported Sitting balance-Leahy Scale: Fair Sitting balance - Comments: able to maintian sitting with distant supervision today.                                    Cognition Arousal/Alertness: Awake/alert Behavior During  Therapy: WFL for tasks assessed/performed Overall Cognitive Status: No family/caregiver present to determine baseline cognitive functioning                                          Exercises Other Exercises Other Exercises: seated AROM with cues    General Comments        Pertinent Vitals/Pain Pain Assessment Pain Assessment: Faces Faces Pain Scale: Hurts little more Pain Location: L foot - Pain Descriptors / Indicators: Aching, Sore Pain Intervention(s): Limited activity within patient's tolerance, Monitored during session, Repositioned    Home Living                          Prior Function            PT Goals (current goals can now be found in the care plan section) Progress towards PT goals: Progressing toward goals    Frequency    Min 2X/week      PT Plan Current plan remains appropriate    Co-evaluation              AM-PAC PT "6 Clicks" Mobility   Outcome Measure  Help needed turning from your back to your side while in a flat bed without using bedrails?: A Little Help needed moving from lying on your back to sitting on the side of a flat bed without using bedrails?: A Little Help needed moving to and from a bed to a chair (including a wheelchair)?: Total Help needed standing up from a chair using your arms (e.g., wheelchair or bedside chair)?: Total Help needed to walk in hospital room?: Total Help needed climbing 3-5 steps with a railing? : Total 6 Click Score: 10    End of Session Equipment Utilized During Treatment: Gait belt Activity Tolerance: Patient limited by fatigue Patient left: in bed;with bed alarm set;with call bell/phone within reach Nurse Communication: Mobility status;Weight bearing status PT Visit Diagnosis: Unsteadiness on feet (R26.81);Other abnormalities of gait and mobility (R26.89);Muscle weakness (generalized) (M62.81);Difficulty in walking, not elsewhere classified (R26.2);Pain Pain -  Right/Left: Left Pain - part of body: Ankle and joints of foot     Time: 1517-6160 PT Time Calculation (min) (ACUTE ONLY): 18 min  Charges:  $Therapeutic Activity: 8-22 mins                   Chesley Noon, PTA 06/07/22, 10:20 AM

## 2022-06-07 NOTE — TOC Progression Note (Signed)
Transition of Care [facility]) - Progression Note    Patient Details  Name: Michele Matthews MRN: [id number] Date of Birth: 08/02/1945  Transition of Care [facility] CM/SW Contact  Beverly Sessions, RN Phone Number: 06/07/2022, 2:55 PM  Clinical Narrative:     Per vascular Patient will be taken to the operating room on 06/09/2022 for left lower extremity bypass grafting        Expected Discharge Plan and Services                                               Social Determinants of Health (SDOH) Interventions SDOH Screenings   Food Insecurity: No Food Insecurity (05/26/2022)  Housing: Low Risk  (05/26/2022)  Transportation Needs: No Transportation Needs (05/26/2022)  Utilities: Not At Risk (05/26/2022)  Financial Resource Strain: Medium Risk (11/08/2021)  Tobacco Use: High Risk (05/31/2022)    Readmission Risk Interventions     No data to display

## 2022-06-08 DIAGNOSIS — L089 Local infection of the skin and subcutaneous tissue, unspecified: Secondary | ICD-10-CM | POA: Diagnosis not present

## 2022-06-08 DIAGNOSIS — I5042 Chronic combined systolic (congestive) and diastolic (congestive) heart failure: Secondary | ICD-10-CM | POA: Diagnosis not present

## 2022-06-08 NOTE — Progress Notes (Signed)
Occupational Therapy Treatment Patient Details Name: Michele Matthews MRN: [id number] DOB: 1946/03/07 Today's Date: 06/08/2022   History of present illness Michele Matthews is a 77 y.o. 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  She was found to have a left heel wound with infection. Vascular consulted with plan for angiogram, at risk for amputation. Pt has been to rehab after past admission, generally negative experience per family with perceived decline in function by time to DC.   OT comments  [name] was seen for OT treatment on this date. Upon arrival to room pt reclined in bed, agreeable to tx. Pt requires MIN A exit bed, appears anxious with mobility. Initiates lateral scoot t/f MIN A however requests to complete with SPT. MAX A bed>chair SPT. MAX A x2 pericare standing. Pt making good progress toward goals, will continue to follow POC. Discharge recommendation remains appropriate.     Recommendations for follow up therapy are one component of a multi-disciplinary discharge planning process, led by the attending physician.  Recommendations may be updated based on patient status, additional functional criteria and insurance authorization.    Follow Up Recommendations  Skilled nursing-short term rehab (<3 hours/day)     Assistance Recommended at Discharge Frequent or constant Supervision/Assistance  Patient can return home with the following  Two people to help with walking and/or transfers;Two people to help with bathing/dressing/bathroom;Help with stairs or ramp for entrance;Assistance with cooking/housework;Assist for transportation   Equipment Recommendations  Hospital bed    Recommendations for Other Services      Precautions / Restrictions Precautions Precautions:  Fall Restrictions Weight Bearing Restrictions: Yes LLE Weight Bearing: Weight bearing as tolerated Other Position/Activity Restrictions: in surgical shoe       Mobility Bed Mobility Overal bed mobility: Needs Assistance Bed Mobility: Supine to Sit     Supine to sit: Min assist          Transfers Overall transfer level: Needs assistance   Transfers: Bed to chair/wheelchair/BSC, Sit to/from Stand Sit to Stand: Mod assist   Squat pivot transfers: Mod assist             Balance Overall balance assessment: Needs assistance Sitting-balance support: Bilateral upper extremity supported Sitting balance-Leahy Scale: Fair     Standing balance support: Bilateral upper extremity supported Standing balance-Leahy Scale: Poor                             ADL either performed or assessed with clinical judgement   ADL Overall ADL's : Needs assistance/impaired                                       General ADL Comments: MAX A simulated BSC t/f. MAX A x2 pericare standing      Cognition Arousal/Alertness: Awake/alert Behavior During Therapy: WFL for tasks assessed/performed Overall Cognitive Status: Within Functional Limits for tasks assessed  Pertinent Vitals/ Pain       Pain Assessment Pain Assessment: Faces Faces Pain Scale: Hurts even more Pain Location: L foot - Pain Descriptors / Indicators: Aching, Sore Pain Intervention(s): Limited activity within patient's tolerance, Repositioned, Patient requesting pain meds-RN notified   Frequency  Min 2X/week        Progress Toward Goals  OT Goals(current goals can now be found in the care plan section)  Progress towards OT goals: Progressing toward goals  Acute Rehab OT Goals Patient Stated Goal: to stand OT Goal Formulation: With patient Time For Goal Achievement: 06/13/22 Potential to Achieve Goals: Good ADL  Goals Pt Will Perform Grooming: with modified independence;sitting Pt Will Perform Lower Body Dressing: with modified independence;sitting/lateral leans Pt Will Transfer to Toilet: with min guard assist;ambulating;bedside commode  Plan Discharge plan remains appropriate;Frequency remains appropriate    Co-evaluation                 AM-PAC OT "6 Clicks" Daily Activity     Outcome Measure   Help from another person eating meals?: None Help from another person taking care of personal grooming?: A Little Help from another person toileting, which includes using toliet, bedpan, or urinal?: A Lot Help from another person bathing (including washing, rinsing, drying)?: A Lot Help from another person to put on and taking off regular upper body clothing?: A Little Help from another person to put on and taking off regular lower body clothing?: A Lot 6 Click Score: 16    End of Session    OT Visit Diagnosis: Other abnormalities of gait and mobility (R26.89);Muscle weakness (generalized) (M62.81)   Activity Tolerance Patient tolerated treatment well   Patient Left in chair;with call bell/phone within reach;with chair alarm set;with nursing/sitter in room   Nurse Communication Mobility status;Patient requests pain meds        Time: 8841-6606 OT Time Calculation (min): 29 min  Charges: OT General Charges $OT Visit: 1 Visit OT Treatments $Self Care/Home Management : 23-37 mins  Dessie Coma, M.S. OTR/L  06/08/22, 11:18 AM  ascom [phone]

## 2022-06-08 NOTE — Progress Notes (Signed)
[facility] Progress Note  [name]    WJX:[id number]  DOA: 05/26/2022     Date of Service: the patient was seen and examined on 06/08/2022  Chief Complaint  Patient presents with   Wound Infection   Brief hospital course: Michele Matthews is a 77 y.o. 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  She was found to have a left heel wound with infection.  Podiatry consult obtained, patient started antibiotics with Rocephin and vancomycin. PAD, seen by vascular surgery, scheduled surgery today.     Assessment and Plan: Left foot infection:  Peripheral arterial disease. S/p left foot TMA done on 11/30/2020 No evidence of sepsis.  Patient has been seen by podiatry, s/p vancomycin, Flagyl and Rocephin.  Transition to doxycycline for 10 days on 06/04/22 Patient was not able to tolerate MRI even give her dose of Ativan.  Currently pending ABI test to determine the next course of action.  Given high dose of oxycodone prior to the ABI testing as patient has unable to tolerate. Discussed with Dr. Posey Pronto, patient does not need debridement.  Recommend antibiotic treatment and follow-up with wound care. Vascular surgery consulted, s/p LLE angiogram,  Diffuse atherosclerotic changes of the left lower extremity, occlusion of the SFA stents as well as the SFA and the popliteal in its entirety.  There appears to be anterior tibial runoff however the this does not appear to cross the ankle. Continue Plavix Wound care RN consulted 2/1 d/w vascular surgery, patient is a bypass surgery which will take around 5 hours. 2/3 as per podiatry, antibiotics can be changed to oral doxycycline for 10 days.   2/4 patient and family decided for LLE bypass, vascular surgery is aware, waiting for surgical  plan. 2/7 scheduled for bypass surgery on Thursday morning 06/09/22  Keep n.p.o. after midnight  Chronic combined systolic and diastolic heart failure ([place]): 2D echo on 07/24/2017 showed EF 25 to 30% with grade 2 diastolic dysfunction.  Patient does not have any short of breath, BNP 284, patient does not have exacerbation congestive heart failure.   Appear euvolemic. Continued Lasix 20 mg twice daily, Coreg 3.125 twice daily, lisinopril 5 mg twice daily home dose. 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  Right atrium moderately dilated. 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 For  myxoma, just needs to be monitored if no symptoms.  And can follow-up as an outpatient with cardiology for further management plans down the road.   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   COPD (chronic obstructive pulmonary disease): Stable   History of stroke with left hemiparesis. Continue home medicines.  1/31 c/o right-sided weakness since admission which is new 1/31 CT head: Hypodensity in the left aspect of the pons and midbrain may reflect artifact; however, acute infarct is not excluded. Recommend brain MRI for further evaluation. Patient is refusing MRI, cannot tolerate it  Continue monitor on telemetry, continue neurocheck every 4 hourly Cautions fall and aspiration precautions Ambulate with assistance, frequent turning every 8 hourly TSH level 3.15  2/1 D/w patient's husband over the phone that if he is still concerned then we can repeat CT scan of the head.  He will let us know.  Patient is unable to understand her medical situation.  Isotonic hyponatremia Serum osmolality 285 Na 131--132--133 Monitor sodium level  daily   Coronary artery disease:  S/p Plavix 300 po once, followed by Plavix 75 mg p.o. daily   Essential hypertension On Coreg and lisinopril   Depression with anxiety -Continue home medications 2/1 started Xanax as needed  Tobacco abuse -nicotine patch Advised to quit.    Body mass index is 21.94 kg/m.  Interventions:       Diet: Heart healthy diet DVT Prophylaxis: Subcutaneous Heparin    Advance goals of care discussion: Full code  Family Communication: family was not present at bedside, at the time of interview.  The pt provided permission to discuss medical plan with the family. Opportunity was given to ask question and all questions were answered satisfactorily.  2/2 discussed with patient's husband over the phone. 2/3 discussed with patient's husband and her daughter over the phone 2/4 d/w patient husband over the phone, agreed to consult palliative care but wanted her to be full code for now  Disposition:  Pt is from Home, admitted with left foot infection, peripheral vascular disease, still has PVD, vascular surgery following, needs LLE bypass sx, which precludes a safe discharge. Discharge to SNF vs HH, when clinically stable. 2/3 patient's family visited her and decided to move forward for LLE bypass surgery.  Vascular surgery was informed, plan for LLE bypass surgery on 06/09/2022   Subjective: No significant events overnight, patient was sitting on the recliner comfortably, pain is under control.  Patient is aware that she is going for surgery tomorrow a.m.  Patient was complaining of mild productive cough, no any other active issues.   Physical Exam: General: NAD, resting comfortably, sleepy Appear in no distress, affect appropriate Eyes: PERRLA ENT: Oral Mucosa Clear, moist  Neck: no JVD,  Cardiovascular: S1 and S2 Present, no Murmur,  Respiratory: good respiratory effort, Bilateral Air entry equal and Decreased, no Crackles, no wheezes Abdomen: Bowel  Sound present, Soft and no tenderness,  Skin: no rashes Extremities: no Pedal edema, no calf tenderness, left foot dressing CDI Neurologic: Residual weakness on the left side.  Right arm no weakness, right leg is weak. Gait not checked due to patient safety concerns  Vitals:   06/07/22 1931 06/08/22 0406 06/08/22 0442 06/08/22 0717  BP: (!) 141/66 (!) 150/84  109/67  Pulse: 88 95  100  Resp: '20 20  18  '$ Temp: 98.7 F (37.1 C) 98 F (36.7 C)  98.9 F (37.2 C)  TempSrc:      SpO2: 96% 92%  92%  Weight:   57.4 kg   Height:        Intake/Output Summary (Last 24 hours) at 06/08/2022 1213 Last data filed at 06/08/2022 0941 Gross per 24 hour  Intake 480 ml  Output 700 ml  Net -220 ml   Filed Weights   06/06/22 0500 06/07/22 0437 06/08/22 0442  Weight: 59 kg 60.8 kg 57.4 kg    Data Reviewed: I have personally reviewed and interpreted daily labs, tele strips, imagings as discussed above. I reviewed all nursing notes, pharmacy notes, vitals, pertinent old records I have discussed plan of care as described above with RN and patient/family.  CBC: Recent Labs  Lab 06/03/22 0321 06/04/22 0317 06/05/22 0448 06/06/22 0330 06/07/22 1049  WBC 10.3 11.2* 10.6* 9.8 11.1*  HGB 10.0* 10.0* 10.5* 10.5* 10.6*  HCT 31.9* 32.1* 33.3* 33.0* 33.8*  MCV 87.9 87.7 87.9 86.6 87.3  PLT 214 212 230 263 384   Basic Metabolic Panel: Recent Labs  Lab 06/02/22 0530 06/03/22 0321 06/04/22 0630 06/05/22 0448 06/06/22 0330 06/07/22 1049  NA 138 135 131* 132* 133* 133*  K 3.6 3.5 3.7 3.8 4.2 4.1  CL 104 100 97* 97* 97* 97*  CO2 '27 27 26 28 27 28  '$ GLUCOSE 137* 94 96 97 91 112*  BUN '21 18 19 20 '$ 26* 27*  CREATININE 0.72 0.60 0.62 0.67 0.70 0.76  CALCIUM 8.6* 8.4* 8.7* 9.1 9.1 9.3  MG 1.7 2.1 1.9 2.1  --   --   PHOS 2.4* 3.0 3.0 3.4  --   --     Studies: No results found.  Scheduled Meds:  atorvastatin  20 mg Oral Daily   busPIRone  7.5 mg Oral BID   carvedilol  3.125 mg Oral BID WC    Chlorhexidine Gluconate Cloth  6 each Topical Once   clopidogrel  75 mg Oral Q breakfast   doxycycline  100 mg Oral Q12H   feeding supplement  237 mL Oral TID BM   furosemide  20 mg Oral Daily   heparin  5,000 Units Subcutaneous Q8H   lisinopril  5 mg Oral Daily   multivitamin with minerals  1 tablet Oral Daily   nicotine  21 mg Transdermal Daily   pantoprazole  20 mg Oral Daily   sertraline  100 mg Oral Daily   sodium chloride flush  3 mL Intravenous Q12H   Continuous Infusions:  sodium chloride     PRN Meds: sodium chloride, acetaminophen, acetaminophen, albuterol, ALPRAZolam, dextromethorphan-guaiFENesin, diclofenac Sodium, fentaNYL (SUBLIMAZE) injection, hydrALAZINE, hydrOXYzine, melatonin, morphine injection, morphine injection, ondansetron (ZOFRAN) IV, ondansetron (ZOFRAN) IV, ondansetron (ZOFRAN) IV, oxyCODONE, oxyCODONE-acetaminophen, senna-docusate, sodium chloride flush, tetrahydrozoline  Time spent: 35 minutes  Author: Val Matthews. MD [facility] 06/08/2022 12:13 PM  To reach On-call, see care teams to locate the attending and reach out to them via [email or web address]. If 7PM-7AM, please contact night-coverage If you still have difficulty reaching the attending provider, please page the [facility] (Director on Call) for [facility] on amion for assistance.

## 2022-06-08 NOTE — H&P (View-Only) (Signed)
Progress Note    06/08/2022 10:08 AM 8 Days Post-Op  Subjective:  Michele Matthews is a 77 yo female who POD #8 S/P Left lower extremity angiography. Percutaneous transluminal angioplasty and stent placement left external iliac artery.    Patient is uncomfortably working at the bedside this morning with occupational therapy. States pain upon movement or at rest to her left lower extremity has returned overnight. States she agrees to have bypass surgery. Patient was informed she will be going to surgery tomorrow morning. No other complaints overnight and vitals all remain stable.     Vitals:   06/08/22 0406 06/08/22 0717  BP: (!) 150/84 109/67  Pulse: 95 100  Resp: 20 18  Temp: 98 F (36.7 C) 98.9 F (37.2 C)  SpO2: 92% 92%   Physical Exam: Cardiac:  RRR Lungs:   Clear Throughout. Non labored.  Incisions:   Right Groin dressing C/D/I without hematoma, seroma   Extremities:  Left lower extremity with transmetatarsal amputation site and unstageable pressure ulcer to heel. Foot is cool to palpation this morning. Positive pain on palpation. Unable to palpate PT/DP pulses.  Abdomen:   Positive bowel sounds, soft, flat NT/ND    Neurologic: AAOX3 Uncomfortable due to left foot pain. Answers questions appropriately  CBC    Component Value Date/Time   WBC 11.1 (H) 06/07/2022 1049   RBC 3.87 06/07/2022 1049   HGB 10.6 (L) 06/07/2022 1049   HGB 12.1 03/27/2014 1730   HCT 33.8 (L) 06/07/2022 1049   HCT 37.0 03/27/2014 1730   PLT 297 06/07/2022 1049   PLT 175 03/27/2014 1730   MCV 87.3 06/07/2022 1049   MCV 97 03/27/2014 1730   MCH 27.4 06/07/2022 1049   MCHC 31.4 06/07/2022 1049   RDW 15.8 (H) 06/07/2022 1049   RDW 13.3 03/27/2014 1730   LYMPHSABS 1.2 05/26/2022 1354   LYMPHSABS 1.9 11/25/2013 0401   MONOABS 0.6 05/26/2022 1354   MONOABS 0.7 11/25/2013 0401   EOSABS 0.2 05/26/2022 1354   EOSABS 0.2 11/25/2013 0401   BASOSABS 0.0 05/26/2022 1354   BASOSABS 0.0 11/25/2013 0401     BMET    Component Value Date/Time   NA 133 (L) 06/07/2022 1049   NA 140 03/28/2014 0441   K 4.1 06/07/2022 1049   K 4.0 03/28/2014 0441   CL 97 (L) 06/07/2022 1049   CL 110 (H) 03/28/2014 0441   CO2 28 06/07/2022 1049   CO2 24 03/28/2014 0441   GLUCOSE 112 (H) 06/07/2022 1049   GLUCOSE 86 03/28/2014 0441   BUN 27 (H) 06/07/2022 1049   BUN 19 (H) 03/28/2014 0441   CREATININE 0.76 06/07/2022 1049   CREATININE 0.79 11/24/2015 0908   CALCIUM 9.3 06/07/2022 1049   CALCIUM 8.7 03/28/2014 0441   GFRNONAA >60 06/07/2022 1049   GFRNONAA 76 11/24/2015 0908   GFRAA >60 07/28/2017 0507   GFRAA 87 11/24/2015 0908    INR    Component Value Date/Time   INR 1.0 05/26/2022 1354   INR 2.0 03/30/2014 0336     Intake/Output Summary (Last 24 hours) at 06/08/2022 1008 Last data filed at 06/08/2022 0941 Gross per 24 hour  Intake 480 ml  Output 700 ml  Net -220 ml     Assessment/Plan:  77 y.o. female is s/p Left lower extremity angiography. Percutaneous transluminal angioplasty and stent placement left external iliac artery.  8 Days Post-Op   PLAN: Continue pain control as prescribed. Scheduled to go to the operating room tomorrow for bypass  surgery.  Will make NPO after midnight.   DVT prophylaxis:  Plavix 75 MG QD and Heparin 5000 Units SQ     [name] [facility] 06/08/2022 10:08 AM

## 2022-06-08 NOTE — Progress Notes (Signed)
Progress Note    06/08/2022 10:08 AM 8 Days Post-Op  Subjective:  Michele Matthews is a 77 yo female who POD #8 S/P Left lower extremity angiography. Percutaneous transluminal angioplasty and stent placement left external iliac artery.    Patient is uncomfortably working at the bedside this morning with occupational therapy. States pain upon movement or at rest to her left lower extremity has returned overnight. States she agrees to have bypass surgery. Patient was informed she will be going to surgery tomorrow morning. No other complaints overnight and vitals all remain stable.     Vitals:   06/08/22 0406 06/08/22 0717  BP: (!) 150/84 109/67  Pulse: 95 100  Resp: 20 18  Temp: 98 F (36.7 C) 98.9 F (37.2 C)  SpO2: 92% 92%   Physical Exam: Cardiac:  RRR Lungs:   Clear Throughout. Non labored.  Incisions:   Right Groin dressing C/D/I without hematoma, seroma   Extremities:  Left lower extremity with transmetatarsal amputation site and unstageable pressure ulcer to heel. Foot is cool to palpation this morning. Positive pain on palpation. Unable to palpate PT/DP pulses.  Abdomen:   Positive bowel sounds, soft, flat NT/ND    Neurologic: AAOX3 Uncomfortable due to left foot pain. Answers questions appropriately  CBC    Component Value Date/Time   WBC 11.1 (H) 06/07/2022 1049   RBC 3.87 06/07/2022 1049   HGB 10.6 (L) 06/07/2022 1049   HGB 12.1 03/27/2014 1730   HCT 33.8 (L) 06/07/2022 1049   HCT 37.0 03/27/2014 1730   PLT 297 06/07/2022 1049   PLT 175 03/27/2014 1730   MCV 87.3 06/07/2022 1049   MCV 97 03/27/2014 1730   MCH 27.4 06/07/2022 1049   MCHC 31.4 06/07/2022 1049   RDW 15.8 (H) 06/07/2022 1049   RDW 13.3 03/27/2014 1730   LYMPHSABS 1.2 05/26/2022 1354   LYMPHSABS 1.9 11/25/2013 0401   MONOABS 0.6 05/26/2022 1354   MONOABS 0.7 11/25/2013 0401   EOSABS 0.2 05/26/2022 1354   EOSABS 0.2 11/25/2013 0401   BASOSABS 0.0 05/26/2022 1354   BASOSABS 0.0 11/25/2013 0401     BMET    Component Value Date/Time   NA 133 (L) 06/07/2022 1049   NA 140 03/28/2014 0441   K 4.1 06/07/2022 1049   K 4.0 03/28/2014 0441   CL 97 (L) 06/07/2022 1049   CL 110 (H) 03/28/2014 0441   CO2 28 06/07/2022 1049   CO2 24 03/28/2014 0441   GLUCOSE 112 (H) 06/07/2022 1049   GLUCOSE 86 03/28/2014 0441   BUN 27 (H) 06/07/2022 1049   BUN 19 (H) 03/28/2014 0441   CREATININE 0.76 06/07/2022 1049   CREATININE 0.79 11/24/2015 0908   CALCIUM 9.3 06/07/2022 1049   CALCIUM 8.7 03/28/2014 0441   GFRNONAA >60 06/07/2022 1049   GFRNONAA 76 11/24/2015 0908   GFRAA >60 07/28/2017 0507   GFRAA 87 11/24/2015 0908    INR    Component Value Date/Time   INR 1.0 05/26/2022 1354   INR 2.0 03/30/2014 0336     Intake/Output Summary (Last 24 hours) at 06/08/2022 1008 Last data filed at 06/08/2022 0941 Gross per 24 hour  Intake 480 ml  Output 700 ml  Net -220 ml     Assessment/Plan:  77 y.o. female is s/p Left lower extremity angiography. Percutaneous transluminal angioplasty and stent placement left external iliac artery.  8 Days Post-Op   PLAN: Continue pain control as prescribed. Scheduled to go to the operating room tomorrow for bypass  surgery.  Will make NPO after midnight.   DVT prophylaxis:  Plavix 75 MG QD and Heparin 5000 Units SQ     Erdine Hulen R Fredis Malkiewicz [facility] 06/08/2022 10:08 AM

## 2022-06-08 NOTE — Progress Notes (Signed)
SUBJECTIVE:     Patient is a 77 y.o. female with medical history significant of s/p of transmetatarsal amputation of left foot on 11/30/20, PAD, hypertension, hyperlipidemia, COPD, CAD, CHF with EF 25 to 30%, stroke, depression with anxiety, upper GI bleeding, who presented to the ED on 05/26/22 with left foot pain.  She was found to have a left heel wound with infection.  Podiatry consult obtained, patient started antibiotics with Rocephin and vancomycin.    Left lower extremity angiogram completed today revealed diffuse atherosclerotic changes of the left lower extremity, occlusion of the SFA stents as well as the SFA and the popliteal in its entirety, anterior tibial runoff however the this does not appear to cross the ankle.   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  Patient scheduled for LLE bypass 06/09/22.   Intake/Output Summary (Last 24 hours) at 06/08/2022 0926 Last data filed at 06/07/2022 1852 Gross per 24 hour  Intake 480 ml  Output --  Net 480 ml    LABS: Basic Metabolic Panel: Recent Labs    06/06/22 0330 06/07/22 1049  NA 133* 133*  K 4.2 4.1  CL 97* 97*  CO2 27 28  GLUCOSE 91 112*  BUN 26* 27*  CREATININE 0.70 0.76  CALCIUM 9.1 9.3   Liver Function Tests: No results for input(s): "AST", "ALT", "ALKPHOS", "BILITOT", "PROT", "ALBUMIN" in the last 72 hours. No results for input(s): "LIPASE", "AMYLASE" in the last 72 hours. CBC: Recent Labs    06/06/22 0330 06/07/22 1049  WBC 9.8 11.1*  HGB 10.5* 10.6*  HCT 33.0* 33.8*  MCV 86.6 87.3  PLT 263 297   Cardiac Enzymes: No results for input(s): "CKTOTAL", "CKMB", "CKMBINDEX", "TROPONINI" in the last 72 hours. BNP: Invalid input(s): "POCBNP" D-Dimer: No results for input(s): "DDIMER" in the last 72 hours. Hemoglobin A1C: No results for input(s):  "HGBA1C" in the last 72 hours. Fasting Lipid Panel: No results for input(s): "CHOL", "HDL", "LDLCALC", "TRIG", "CHOLHDL", "LDLDIRECT" in the last 72 hours. Thyroid Function Tests: No results for input(s): "TSH", "T4TOTAL", "T3FREE", "THYROIDAB" in the last 72 hours.  Invalid input(s): "FREET3" Anemia Panel: No results for input(s): "VITAMINB12", "FOLATE", "FERRITIN", "TIBC", "IRON", "RETICCTPCT" in the last 72 hours.   PHYSICAL EXAM General: Well developed, well nourished, in no acute distress HEENT:  Normocephalic and atramatic Neck:  No JVD.  Lungs: Clear bilaterally to auscultation and percussion. Heart: HRRR . Normal S1 and S2 without gallops or murmurs.  Abdomen: Bowel sounds are positive, abdomen soft and non-tender  Msk:  Back normal, normal gait. Normal strength and tone for age. Extremities: No clubbing, cyanosis or edema.   Neuro: Alert and oriented X 3. Psych:  Good affect, responds appropriately  TELEMETRY: sinus rhythm, HR 86 bpm  ASSESSMENT AND PLAN: Patient sleeping in bed.  Patient and family have decided for LLE bypass. Surgery date 06/09/22.   Principal Problem:   Left foot infection Active Problems:   COPD (chronic obstructive pulmonary disease) (HCC)   Coronary artery disease   Tobacco abuse   Essential hypertension   Chronic combined systolic and diastolic heart failure (HCC)   PAD (peripheral artery disease) (HCC)   HLD (hyperlipidemia)   Depression with anxiety   Leg edema, right   Hemiparesis affecting left side as late effect of cerebrovascular accident (CVA) ([place])  Lareta Bruneau, FNP-C 06/08/2022 9:26 AM

## 2022-06-09 ENCOUNTER — Encounter
Admission: EM | Disposition: A | Discharge status: Home Health | Payer: Self-pay | Source: Home / Self Care | Attending: Student

## 2022-06-09 ENCOUNTER — Other Ambulatory Visit: Payer: Self-pay

## 2022-06-09 ENCOUNTER — Inpatient Hospital Stay: Payer: Medicare HMO | Admitting: Anesthesiology

## 2022-06-09 ENCOUNTER — Encounter: Payer: Self-pay | Admitting: Internal Medicine

## 2022-06-09 DIAGNOSIS — L089 Local infection of the skin and subcutaneous tissue, unspecified: Secondary | ICD-10-CM | POA: Diagnosis not present

## 2022-06-09 DIAGNOSIS — L97529 Non-pressure chronic ulcer of other part of left foot with unspecified severity: Secondary | ICD-10-CM | POA: Diagnosis not present

## 2022-06-09 DIAGNOSIS — I70245 Atherosclerosis of native arteries of left leg with ulceration of other part of foot: Secondary | ICD-10-CM

## 2022-06-09 HISTORY — PX: FEMORAL-TIBIAL BYPASS GRAFT: SHX938

## 2022-06-09 LAB — CBC
HCT: 34.9 % — ABNORMAL LOW (ref 36.0–46.0)
HCT: 29 % — ABNORMAL LOW (ref 36.0–46.0)
Hemoglobin: 11 g/dL — ABNORMAL LOW (ref 12.0–15.0)
Hemoglobin: 8.8 g/dL — ABNORMAL LOW (ref 12.0–15.0)
MCH: 27.6 pg (ref 26.0–34.0)
MCH: 27.4 pg (ref 26.0–34.0)
MCHC: 31.5 g/dL (ref 30.0–36.0)
MCHC: 30.3 g/dL (ref 30.0–36.0)
MCV: 87.7 fL (ref 80.0–100.0)
MCV: 90.3 fL (ref 80.0–100.0)
Platelets: 328 K/uL (ref 150–400)
Platelets: 384 10*3/uL (ref 150–400)
RBC: 3.98 MIL/uL (ref 3.87–5.11)
RBC: 3.21 MIL/uL — ABNORMAL LOW (ref 3.87–5.11)
RDW: 15.9 % — ABNORMAL HIGH (ref 11.5–15.5)
RDW: 16 % — ABNORMAL HIGH (ref 11.5–15.5)
WBC: 10.8 K/uL — ABNORMAL HIGH (ref 4.0–10.5)
WBC: 22.5 10*3/uL — ABNORMAL HIGH (ref 4.0–10.5)
nRBC: 0 % (ref 0.0–0.2)
nRBC: 0 % (ref 0.0–0.2)

## 2022-06-09 LAB — SURGICAL PCR SCREEN
MRSA, PCR: NEGATIVE
Staphylococcus aureus: NEGATIVE

## 2022-06-09 LAB — BASIC METABOLIC PANEL WITH GFR
Anion gap: 11 (ref 5–15)
BUN: 28 mg/dL — ABNORMAL HIGH (ref 8–23)
CO2: 25 mmol/L (ref 22–32)
Calcium: 9.4 mg/dL (ref 8.9–10.3)
Chloride: 99 mmol/L (ref 98–111)
Creatinine, Ser: 0.75 mg/dL (ref 0.44–1.00)
GFR, Estimated: >60 mL/min
Glucose, Bld: 94 mg/dL (ref 70–99)
Potassium: 4.1 mmol/L (ref 3.5–5.1)
Sodium: 135 mmol/L (ref 135–145)

## 2022-06-09 LAB — GLUCOSE, CAPILLARY: Glucose-Capillary: 125 mg/dL — ABNORMAL HIGH (ref 70–99)

## 2022-06-09 LAB — MRSA NEXT GEN BY PCR, NASAL: MRSA by PCR Next Gen: NOT DETECTED

## 2022-06-09 LAB — MAGNESIUM: Magnesium: 2 mg/dL (ref 1.7–2.4)

## 2022-06-09 LAB — PHOSPHORUS: Phosphorus: 4.3 mg/dL (ref 2.5–4.6)

## 2022-06-09 SURGERY — CREATION, BYPASS, ARTERIAL, FEMORAL TO TIBIAL, USING GRAFT
Anesthesia: General

## 2022-06-09 MED ORDER — BUPIVACAINE HCL (PF) 0.5 % IJ SOLN
INTRAMUSCULAR | Status: AC
  Filled 2022-06-09: qty 30

## 2022-06-09 MED ORDER — DOCUSATE SODIUM 100 MG PO CAPS
100.0000 mg | ORAL_CAPSULE | Freq: Every day | ORAL | Status: DC
  Administered 2022-06-10 – 2022-06-14 (×5): 100 mg via ORAL
  Filled 2022-06-09 (×5): qty 1

## 2022-06-09 MED ORDER — ESMOLOL HCL 100 MG/10ML IV SOLN
INTRAVENOUS | Status: DC | PRN
  Administered 2022-06-09 (×3): 10 mg via INTRAVENOUS

## 2022-06-09 MED ORDER — ONDANSETRON HCL 4 MG/2ML IJ SOLN: 4.0000 mg | Freq: Four times a day (QID) | INTRAMUSCULAR | Status: DC | PRN

## 2022-06-09 MED ORDER — DEXMEDETOMIDINE HCL IN NACL 80 MCG/20ML IV SOLN
INTRAVENOUS | Status: DC | PRN
  Administered 2022-06-09: 8 ug via BUCCAL
  Administered 2022-06-09 (×2): 4 ug via BUCCAL

## 2022-06-09 MED ORDER — OXYCODONE HCL 5 MG PO TABS: 5.0000 mg | ORAL_TABLET | Freq: Once | ORAL | Status: DC | PRN

## 2022-06-09 MED ORDER — MIDAZOLAM HCL 2 MG/ML PO SYRP
8.0000 mg | ORAL_SOLUTION | Freq: Once | ORAL | Status: DC | PRN
  Filled 2022-06-09: qty 5

## 2022-06-09 MED ORDER — PHENYLEPHRINE HCL (PRESSORS) 10 MG/ML IV SOLN
INTRAVENOUS | Status: DC | PRN
  Administered 2022-06-09 (×2): 160 ug via INTRAVENOUS

## 2022-06-09 MED ORDER — CHLORHEXIDINE GLUCONATE CLOTH 2 % EX PADS
6.0000 | MEDICATED_PAD | Freq: Once | CUTANEOUS | Status: AC
  Administered 2022-06-09: 6 via TOPICAL

## 2022-06-09 MED ORDER — ONDANSETRON HCL 4 MG/2ML IJ SOLN
INTRAMUSCULAR | Status: DC | PRN
  Administered 2022-06-09 (×2): 4 mg via INTRAVENOUS

## 2022-06-09 MED ORDER — METOPROLOL TARTRATE 5 MG/5ML IV SOLN: 2.0000 mg | INTRAVENOUS | Status: DC | PRN

## 2022-06-09 MED ORDER — ENOXAPARIN SODIUM 30 MG/0.3ML IJ SOSY: 30.0000 mg | PREFILLED_SYRINGE | INTRAMUSCULAR | Status: DC

## 2022-06-09 MED ORDER — HEPARIN SODIUM (PORCINE) 1000 UNIT/ML IJ SOLN
INTRAMUSCULAR | Status: AC
  Filled 2022-06-09: qty 10

## 2022-06-09 MED ORDER — PHENYLEPHRINE 80 MCG/ML (10ML) SYRINGE FOR IV PUSH (FOR BLOOD PRESSURE SUPPORT)
PREFILLED_SYRINGE | INTRAVENOUS | Status: AC
  Filled 2022-06-09: qty 10

## 2022-06-09 MED ORDER — PROPOFOL 1000 MG/100ML IV EMUL
INTRAVENOUS | Status: AC
  Filled 2022-06-09: qty 100

## 2022-06-09 MED ORDER — SODIUM CHLORIDE 0.9 % IV SOLN: 0.0000 ug/min | INTRAVENOUS | Status: DC

## 2022-06-09 MED ORDER — FENTANYL CITRATE (PF) 100 MCG/2ML IJ SOLN
25.0000 ug | INTRAMUSCULAR | Status: DC | PRN
  Administered 2022-06-09: 12.5 ug via INTRAVENOUS

## 2022-06-09 MED ORDER — ENOXAPARIN SODIUM 40 MG/0.4ML IJ SOSY
40.0000 mg | PREFILLED_SYRINGE | INTRAMUSCULAR | Status: DC
  Administered 2022-06-10 – 2022-06-14 (×5): 40 mg via SUBCUTANEOUS
  Filled 2022-06-09 (×5): qty 0.4

## 2022-06-09 MED ORDER — MAGNESIUM SULFATE 2 GM/50ML IV SOLN: 2.0000 g | Freq: Every day | INTRAVENOUS | Status: DC | PRN

## 2022-06-09 MED ORDER — SODIUM CHLORIDE 0.9 % IV SOLN: 500.0000 mL | Freq: Once | INTRAVENOUS | Status: DC | PRN

## 2022-06-09 MED ORDER — METHYLPREDNISOLONE SODIUM SUCC 125 MG IJ SOLR: 125.0000 mg | Freq: Once | INTRAMUSCULAR | Status: DC | PRN

## 2022-06-09 MED ORDER — ALBUMIN HUMAN 5 % IV SOLN: INTRAVENOUS | Status: DC | PRN

## 2022-06-09 MED ORDER — BUPIVACAINE LIPOSOME 1.3 % IJ SUSP
INTRAMUSCULAR | Status: AC
  Filled 2022-06-09: qty 20

## 2022-06-09 MED ORDER — PHENYLEPHRINE HCL-NACL 20-0.9 MG/250ML-% IV SOLN
0.0000 ug/min | INTRAVENOUS | Status: DC
  Administered 2022-06-09: 5 ug/min via INTRAVENOUS
  Administered 2022-06-10: 30 ug/min via INTRAVENOUS
  Administered 2022-06-10: 55 ug/min via INTRAVENOUS
  Filled 2022-06-09 (×3): qty 250

## 2022-06-09 MED ORDER — OXYCODONE-ACETAMINOPHEN 5-325 MG PO TABS
1.0000 | ORAL_TABLET | ORAL | Status: DC | PRN
  Administered 2022-06-11 – 2022-06-14 (×6): 2 via ORAL
  Filled 2022-06-09 (×6): qty 2

## 2022-06-09 MED ORDER — CEFAZOLIN SODIUM-DEXTROSE 2-4 GM/100ML-% IV SOLN
2.0000 g | Freq: Three times a day (TID) | INTRAVENOUS | Status: AC
  Administered 2022-06-09 – 2022-06-10 (×2): 2 g via INTRAVENOUS
  Filled 2022-06-09 (×2): qty 100

## 2022-06-09 MED ORDER — FENTANYL CITRATE PF 50 MCG/ML IJ SOSY: 12.5000 ug | PREFILLED_SYRINGE | Freq: Once | INTRAMUSCULAR | Status: DC | PRN

## 2022-06-09 MED ORDER — EPINEPHRINE PF 1 MG/ML IJ SOLN
INTRAMUSCULAR | Status: AC
  Filled 2022-06-09: qty 1

## 2022-06-09 MED ORDER — CHLORHEXIDINE GLUCONATE CLOTH 2 % EX PADS
6.0000 | MEDICATED_PAD | Freq: Every day | CUTANEOUS | Status: DC
  Administered 2022-06-09 – 2022-06-14 (×6): 6 via TOPICAL

## 2022-06-09 MED ORDER — NITROGLYCERIN IN D5W 200-5 MCG/ML-% IV SOLN
INTRAVENOUS | Status: AC
  Filled 2022-06-09: qty 250

## 2022-06-09 MED ORDER — SODIUM CHLORIDE 0.9 % IV SOLN: INTRAVENOUS | Status: DC | PRN

## 2022-06-09 MED ORDER — CEFAZOLIN SODIUM-DEXTROSE 2-4 GM/100ML-% IV SOLN: 2.0000 g | INTRAVENOUS | Status: DC

## 2022-06-09 MED ORDER — HEMOSTATIC AGENTS (NO CHARGE) OPTIME
TOPICAL | Status: DC | PRN
  Administered 2022-06-09: 2 via TOPICAL

## 2022-06-09 MED ORDER — ALUM & MAG HYDROXIDE-SIMETH 200-200-20 MG/5ML PO SUSP: 15.0000 mL | ORAL | Status: DC | PRN

## 2022-06-09 MED ORDER — FAMOTIDINE 20 MG PO TABS: 40.0000 mg | ORAL_TABLET | Freq: Once | ORAL | Status: DC | PRN

## 2022-06-09 MED ORDER — ORAL CARE MOUTH RINSE: 15.0000 mL | OROMUCOSAL | Status: DC | PRN

## 2022-06-09 MED ORDER — PROPOFOL 10 MG/ML IV BOLUS
INTRAVENOUS | Status: DC | PRN
  Administered 2022-06-09: 50 mg via INTRAVENOUS
  Administered 2022-06-09: 30 mg via INTRAVENOUS

## 2022-06-09 MED ORDER — ALBUMIN HUMAN 5 % IV SOLN
INTRAVENOUS | Status: AC
  Filled 2022-06-09: qty 250

## 2022-06-09 MED ORDER — OXYCODONE HCL 5 MG/5ML PO SOLN: 5.0000 mg | Freq: Once | ORAL | Status: DC | PRN

## 2022-06-09 MED ORDER — SEVOFLURANE IN SOLN
RESPIRATORY_TRACT | Status: AC
  Filled 2022-06-09: qty 250

## 2022-06-09 MED ORDER — SODIUM CHLORIDE 0.9 % IR SOLN
Status: DC | PRN
  Administered 2022-06-09: 300 mL

## 2022-06-09 MED ORDER — HEPARIN SODIUM (PORCINE) 1000 UNIT/ML IJ SOLN
INTRAMUSCULAR | Status: DC | PRN
  Administered 2022-06-09: 1000 [IU] via INTRAVENOUS
  Administered 2022-06-09: 7000 [IU] via INTRAVENOUS
  Administered 2022-06-09: 1000 [IU] via INTRAVENOUS

## 2022-06-09 MED ORDER — ACETAMINOPHEN 10 MG/ML IV SOLN
INTRAVENOUS | Status: DC | PRN
  Administered 2022-06-09: 1000 mg via INTRAVENOUS

## 2022-06-09 MED ORDER — SORBITOL 70 % SOLN: 30.0000 mL | Freq: Every day | Status: DC | PRN

## 2022-06-09 MED ORDER — HYDRALAZINE HCL 20 MG/ML IJ SOLN: 5.0000 mg | INTRAMUSCULAR | Status: DC | PRN

## 2022-06-09 MED ORDER — SODIUM CHLORIDE 0.9 % IV SOLN: INTRAVENOUS | Status: DC

## 2022-06-09 MED ORDER — NITROGLYCERIN IN D5W 200-5 MCG/ML-% IV SOLN: 5.0000 ug/min | INTRAVENOUS | Status: DC

## 2022-06-09 MED ORDER — HYDROMORPHONE HCL 1 MG/ML IJ SOLN
INTRAMUSCULAR | Status: AC
  Filled 2022-06-09: qty 1

## 2022-06-09 MED ORDER — FENTANYL CITRATE (PF) 100 MCG/2ML IJ SOLN
INTRAMUSCULAR | Status: DC | PRN
  Administered 2022-06-09: 100 ug via INTRAVENOUS

## 2022-06-09 MED ORDER — HEPARIN 30,000 UNITS/1000 ML (OHS) CELLSAVER SOLUTION
Status: AC | PRN
  Administered 2022-06-09: 1

## 2022-06-09 MED ORDER — FENTANYL CITRATE (PF) 100 MCG/2ML IJ SOLN
INTRAMUSCULAR | Status: AC
  Filled 2022-06-09: qty 2

## 2022-06-09 MED ORDER — HEPARIN SODIUM (PORCINE) 5000 UNIT/ML IJ SOLN
INTRAMUSCULAR | Status: AC
  Filled 2022-06-09: qty 1

## 2022-06-09 MED ORDER — DOPAMINE-DEXTROSE 3.2-5 MG/ML-% IV SOLN: 3.0000 ug/kg/min | INTRAVENOUS | Status: DC

## 2022-06-09 MED ORDER — HEPARIN SODIUM (PORCINE) 5000 UNIT/ML IJ SOLN
INTRAMUSCULAR | Status: AC
  Filled 2022-06-09: qty 3

## 2022-06-09 MED ORDER — GUAIFENESIN-DM 100-10 MG/5ML PO SYRP: 15.0000 mL | ORAL_SOLUTION | ORAL | Status: DC | PRN

## 2022-06-09 MED ORDER — DIPHENHYDRAMINE HCL 50 MG/ML IJ SOLN: 50.0000 mg | Freq: Once | INTRAMUSCULAR | Status: DC | PRN

## 2022-06-09 MED ORDER — LIDOCAINE HCL (CARDIAC) PF 100 MG/5ML IV SOSY
PREFILLED_SYRINGE | INTRAVENOUS | Status: DC | PRN
  Administered 2022-06-09: 40 mg via INTRAVENOUS

## 2022-06-09 MED ORDER — ONDANSETRON HCL 4 MG/2ML IJ SOLN
INTRAMUSCULAR | Status: AC
  Filled 2022-06-09: qty 2

## 2022-06-09 MED ORDER — DEXAMETHASONE SODIUM PHOSPHATE 10 MG/ML IJ SOLN
INTRAMUSCULAR | Status: DC | PRN
  Administered 2022-06-09: 5 mg via INTRAVENOUS

## 2022-06-09 MED ORDER — FAMOTIDINE IN NACL 20-0.9 MG/50ML-% IV SOLN
20.0000 mg | Freq: Two times a day (BID) | INTRAVENOUS | Status: DC
  Administered 2022-06-09 – 2022-06-11 (×4): 20 mg via INTRAVENOUS
  Filled 2022-06-09 (×4): qty 50

## 2022-06-09 MED ORDER — ROCURONIUM BROMIDE 100 MG/10ML IV SOLN
INTRAVENOUS | Status: DC | PRN
  Administered 2022-06-09 (×3): 20 mg via INTRAVENOUS
  Administered 2022-06-09: 30 mg via INTRAVENOUS
  Administered 2022-06-09: 20 mg via INTRAVENOUS
  Administered 2022-06-09: 10 mg via INTRAVENOUS

## 2022-06-09 MED ORDER — POTASSIUM CHLORIDE CRYS ER 20 MEQ PO TBCR: 20.0000 meq | EXTENDED_RELEASE_TABLET | Freq: Every day | ORAL | Status: DC | PRN

## 2022-06-09 MED ORDER — 0.9 % SODIUM CHLORIDE (POUR BTL) OPTIME
TOPICAL | Status: DC | PRN
  Administered 2022-06-09: 1000 mL

## 2022-06-09 MED ORDER — MORPHINE SULFATE (PF) 2 MG/ML IV SOLN: 2.0000 mg | INTRAVENOUS | Status: DC | PRN

## 2022-06-09 MED ORDER — ACETAMINOPHEN 650 MG RE SUPP: 325.0000 mg | RECTAL | Status: DC | PRN

## 2022-06-09 MED ORDER — PHENYLEPHRINE HCL-NACL 20-0.9 MG/250ML-% IV SOLN
INTRAVENOUS | Status: AC
  Filled 2022-06-09: qty 250

## 2022-06-09 MED ORDER — PHENOL 1.4 % MT LIQD: 1.0000 | OROMUCOSAL | Status: DC | PRN

## 2022-06-09 MED ORDER — HYDROMORPHONE HCL 1 MG/ML IJ SOLN
INTRAMUSCULAR | Status: DC | PRN
  Administered 2022-06-09: .3 mg via INTRAVENOUS
  Administered 2022-06-09: .5 mg via INTRAVENOUS
  Administered 2022-06-09: .2 mg via INTRAVENOUS

## 2022-06-09 MED ORDER — NITROGLYCERIN IN D5W 200-5 MCG/ML-% IV SOLN
INTRAVENOUS | Status: DC | PRN
  Administered 2022-06-09: 5 ug/min via INTRAVENOUS

## 2022-06-09 MED ORDER — CEFAZOLIN SODIUM-DEXTROSE 1-4 GM/50ML-% IV SOLN
INTRAVENOUS | Status: DC | PRN
  Administered 2022-06-09: 2 g via INTRAVENOUS

## 2022-06-09 MED ORDER — LABETALOL HCL 5 MG/ML IV SOLN: 10.0000 mg | INTRAVENOUS | Status: DC | PRN

## 2022-06-09 MED ORDER — SENNOSIDES-DOCUSATE SODIUM 8.6-50 MG PO TABS: 1.0000 | ORAL_TABLET | Freq: Every evening | ORAL | Status: DC | PRN

## 2022-06-09 MED ORDER — EPHEDRINE SULFATE (PRESSORS) 50 MG/ML IJ SOLN
INTRAMUSCULAR | Status: DC | PRN
  Administered 2022-06-09 (×2): 5 mg via INTRAVENOUS
  Administered 2022-06-09: 10 mg via INTRAVENOUS

## 2022-06-09 MED ORDER — ACETAMINOPHEN 325 MG PO TABS: 325.0000 mg | ORAL_TABLET | ORAL | Status: DC | PRN

## 2022-06-09 MED ORDER — HYDROMORPHONE HCL 1 MG/ML IJ SOLN
1.0000 mg | Freq: Once | INTRAMUSCULAR | Status: AC | PRN
  Administered 2022-06-10: 1 mg via INTRAVENOUS
  Filled 2022-06-09: qty 1

## 2022-06-09 MED ORDER — SUGAMMADEX SODIUM 200 MG/2ML IV SOLN
INTRAVENOUS | Status: DC | PRN
  Administered 2022-06-09: 200 mg via INTRAVENOUS

## 2022-06-09 MED ORDER — PHENYLEPHRINE HCL-NACL 20-0.9 MG/250ML-% IV SOLN
INTRAVENOUS | Status: DC | PRN
  Administered 2022-06-09: 160 ug via INTRAVENOUS
  Administered 2022-06-09: 80 ug via INTRAVENOUS
  Administered 2022-06-09 (×2): 160 ug via INTRAVENOUS
  Administered 2022-06-09: 30 ug/min via INTRAVENOUS
  Administered 2022-06-09 (×3): 160 ug via INTRAVENOUS
  Administered 2022-06-09: 80 ug via INTRAVENOUS
  Administered 2022-06-09: 160 ug via INTRAVENOUS

## 2022-06-09 SURGICAL SUPPLY — 84 items
BAG DECANTER FOR FLEXI CONT (MISCELLANEOUS) ×2 IMPLANT
BAG ISOLATATION DRAPE 20X20 ST (DRAPES) ×2 IMPLANT
BLADE SURG 15 STRL LF DISP TIS (BLADE) ×2 IMPLANT
BLADE SURG 15 STRL SS (BLADE) ×2
BLADE SURG SZ11 CARB STEEL (BLADE) ×2 IMPLANT
BOOT SUTURE AID YELLOW STND (SUTURE) ×4 IMPLANT
BRUSH SCRUB EZ  4% CHG (MISCELLANEOUS) ×2
BRUSH SCRUB EZ 4% CHG (MISCELLANEOUS) ×2 IMPLANT
CANISTER WOUND CARE 500ML ATS (WOUND CARE) IMPLANT
CAP TUBING WOUND VAC TRAC (MISCELLANEOUS) IMPLANT
CHLORAPREP W/TINT 26 (MISCELLANEOUS) ×4 IMPLANT
CNTNR SPEC 2.5X3XGRAD LEK (MISCELLANEOUS) ×2
CONNECTOR Y WND VAC (MISCELLANEOUS) IMPLANT
CONT SPEC 4OZ STRL OR WHT (MISCELLANEOUS) ×2
CONTAINER SPEC 2.5X3XGRAD LEK (MISCELLANEOUS) IMPLANT
DERMABOND ADVANCED .7 DNX12 (GAUZE/BANDAGES/DRESSINGS) ×4 IMPLANT
DRAPE 3/4 80X56 (DRAPES) ×2 IMPLANT
DRAPE INCISE IOBAN 66X45 STRL (DRAPES) ×4 IMPLANT
DRAPE ISOLATE BAG 20X20 STRL (DRAPES) ×2
DRESSING SURGICEL FIBRLLR 1X2 (HEMOSTASIS) ×4 IMPLANT
DRSG OPSITE POSTOP 4X10 (GAUZE/BANDAGES/DRESSINGS) IMPLANT
DRSG OPSITE POSTOP 4X8 (GAUZE/BANDAGES/DRESSINGS) IMPLANT
DRSG SURGICEL FIBRILLAR 1X2 (HEMOSTASIS) ×4
DRSG VAC GRANUFOAM LG (GAUZE/BANDAGES/DRESSINGS) IMPLANT
ELECT CAUTERY BLADE 6.4 (BLADE) ×4 IMPLANT
ELECT REM PT RETURN 9FT ADLT (ELECTROSURGICAL) ×2
ELECTRODE REM PT RTRN 9FT ADLT (ELECTROSURGICAL) ×2 IMPLANT
GAUZE 4X4 16PLY ~~LOC~~+RFID DBL (SPONGE) ×4 IMPLANT
GEL ULTRASOUND 20GR AQUASONIC (MISCELLANEOUS) ×2 IMPLANT
GLOVE BIO SURGEON STRL SZ7 (GLOVE) ×4 IMPLANT
GLOVE SURG SYN 8.0 (GLOVE) ×2 IMPLANT
GLOVE SURG SYN 8.0 PF PI (GLOVE) ×2 IMPLANT
GOWN STRL REUS W/ TWL LRG LVL3 (GOWN DISPOSABLE) ×8 IMPLANT
GOWN STRL REUS W/ TWL XL LVL3 (GOWN DISPOSABLE) ×4 IMPLANT
GOWN STRL REUS W/TWL LRG LVL3 (GOWN DISPOSABLE) ×8
GOWN STRL REUS W/TWL XL LVL3 (GOWN DISPOSABLE) ×4
GRAFT VASC DISTAFLO 6X80 (Graft) IMPLANT
HEAD CUTTING 'VALVULOTOME URSL (MISCELLANEOUS) IMPLANT
IV NS 500ML (IV SOLUTION) ×2
IV NS 500ML BAXH (IV SOLUTION) ×2 IMPLANT
KIT TURNOVER KIT A (KITS) ×2 IMPLANT
LABEL OR SOLS (LABEL) ×2 IMPLANT
LOOP VESSEL MAXI  1X406 RED (MISCELLANEOUS) ×8
LOOP VESSEL MAXI 1X406 RED (MISCELLANEOUS) ×6 IMPLANT
LOOP VESSEL MINI 0.8X406 BLUE (MISCELLANEOUS) ×4 IMPLANT
MANIFOLD NEPTUNE II (INSTRUMENTS) ×2 IMPLANT
NDL FILTER BLUNT 18X1 1/2 (NEEDLE) ×2 IMPLANT
NEEDLE FILTER BLUNT 18X1 1/2 (NEEDLE) ×2 IMPLANT
NS IRRIG 1000ML POUR BTL (IV SOLUTION) ×2 IMPLANT
PACK BASIN MAJOR ARMC (MISCELLANEOUS) ×2 IMPLANT
PACK UNIVERSAL (MISCELLANEOUS) ×2 IMPLANT
PAD PREP 24X41 OB/GYN DISP (PERSONAL CARE ITEMS) ×2 IMPLANT
SET WALTER ACTIVATION W/DRAPE (SET/KITS/TRAYS/PACK) ×2 IMPLANT
SPONGE T-LAP 18X18 ~~LOC~~+RFID (SPONGE) ×6 IMPLANT
STAPLER SKIN PROX 35W (STAPLE) ×2 IMPLANT
SUT ETHIBOND CT1 BRD #0 30IN (SUTURE) IMPLANT
SUT GORETEX CV-6TTC-13 36IN (SUTURE) IMPLANT
SUT GTX CV-7 30 3/8 TAPER (SUTURE) IMPLANT
SUT MNCRL+ 5-0 UNDYED PC-3 (SUTURE) ×2 IMPLANT
SUT MONOCRYL 5-0 (SUTURE) ×2
SUT PROLENE 3 0 SH DA (SUTURE) ×2 IMPLANT
SUT PROLENE 5 0 RB 1 DA (SUTURE) ×4 IMPLANT
SUT PROLENE 6 0 BV (SUTURE) ×12 IMPLANT
SUT PROLENE 6 0 CC (SUTURE) IMPLANT
SUT PROLENE 7 0 BV 1 (SUTURE) ×8 IMPLANT
SUT SILK 2 0 (SUTURE) ×4
SUT SILK 2 0 SH (SUTURE) ×2 IMPLANT
SUT SILK 2-0 18XBRD TIE 12 (SUTURE) ×2 IMPLANT
SUT SILK 3 0 (SUTURE) ×2
SUT SILK 3-0 18XBRD TIE 12 (SUTURE) ×2 IMPLANT
SUT VIC AB 2-0 CT1 (SUTURE) ×6 IMPLANT
SUT VIC AB 3-0 SH 27 (SUTURE) ×2
SUT VIC AB 3-0 SH 27X BRD (SUTURE) ×2 IMPLANT
SUT VICRYL+ 3-0 36IN CT-1 (SUTURE) ×6 IMPLANT
SYR 20ML LL LF (SYRINGE) ×2 IMPLANT
SYR 3ML LL SCALE MARK (SYRINGE) ×2 IMPLANT
TAPE UMBILICAL 1/8 X36 TWILL (MISCELLANEOUS) ×2 IMPLANT
TOWEL OR 17X26 4PK STRL BLUE (TOWEL DISPOSABLE) ×2 IMPLANT
TRAP FLUID SMOKE EVACUATOR (MISCELLANEOUS) ×2 IMPLANT
TRAY FOLEY MTR SLVR 16FR STAT (SET/KITS/TRAYS/PACK) ×2 IMPLANT
VALVULOTOME HEAD CUTTING URSL (MISCELLANEOUS) IMPLANT
VALVULOTOME URESIL (MISCELLANEOUS)
WATER STERILE IRR 500ML POUR (IV SOLUTION) ×2 IMPLANT
WND VAC CAP TUBING TRAC (MISCELLANEOUS)

## 2022-06-09 NOTE — Op Note (Signed)
VEIN AND VASCULAR SURGERY   OPERATIVE NOTE     PRE-OPERATIVE DIAGNOSIS: Atherosclerosis with ulceration left lower extremity  POST-OPERATIVE DIAGNOSIS: Same as above  PROCEDURE: Left femoral to below-knee popliteal bypass with 6 mm PTFE Left profunda femoris artery endarterectomy Left tibioperoneal trunk and peroneal artery endarterectomy and vein patch angioplasty  SURGEONS: Leotis Pain, MD and Hortencia Pilar, MD  ASSISTANT(S): none  ANESTHESIA: general  ESTIMATED BLOOD LOSS: 400 cc  FINDING(S): No adequate vein for bypass Severely diseased profunda femorus artery Severely diseased TP trunk and peroneal artery  SPECIMEN(S):  left profunda femorus artery plaque.   Left TP trunk and peroneal artery plaque  INDICATIONS:   Michele Matthews is a 77 y.o. female who presents with rest pain and ulceration.  The patient required surgical bypass for revascularization.  She also has heavily diseased profunda femoris artery separate from her planned bypass and we intended to perform endarterectomy on this to try to improve her thigh perfusion and collateral blood flow.  Risks and benefits were discussed including but not limited to bleeding, infection, thrombosis, limb loss, injury to nearby structures, cardiopulmonary complications, and death.  Given the shape of her foot and her already having a contracture somewhat in her knee, there is still a very high likelihood of amputation even with a successful bypass.  Co-surgeons were used due to the multiple surgical fields and the complexity of the surgery.  DESCRIPTION: After full informed written consent was obtained, the patient was brought back to the operating room and placed supine upon the operating table.  Prior to induction, the patient was given intravenous antibiotics.  After obtaining adequate anesthesia, the patient was prepped and draped in the standard fashion for a femoral to popliteal bypass operation.  Attention was  turned to the left groin.  A longitudinal incision was made over the left common femoral artery.  Using blunt dissection and electrocautery, the artery was dissected out from the inguinal ligament down to the femoral bifurcation.  The superficial femoral artery, profunda femoral artery, and external iliac artery were dissected out and vessel loops applied.  Circumflex branches were also dissected and controlled with vessel loops.  This common femoral artery was found on exam to be calcified.  We dissected out the profunda femoris artery beyond the primary branches in order to be able to perform the appropriate endarterectomy that was required to improve circulation to the profunda femoris artery separate and distinct from the bypass.  This was to optimize the flow to the deep tissues particularly in the thigh as well as collateral blood flow distally should the bypass fail.     At this point, attention was turned to the calf.  An longitudinal incision was made one finger-width posterior to the tibia. At this point, the patient's left greater saphenous vein was not found to be adequate for bypass conduit.  We elected to use a 6 mm Distaflo PTFE graft as a conduit.  This was brought onto the field and flushed with heparin. Using blunt dissection and electrocautery, a plane was developed through the subcutaneous tissue and fascia down to the popliteal space.  The popliteal vein was dissected out and retracted medially and posteriorly.  The below-the-knee popliteal artery was dissected away from the popliteal vein.  The dissection was carried down to the tibioperoneal trunk well onto the peroneal artery.  This popliteal artery was found to be a suitable target for the distal bypass anastomosis.    The anterior tibial artery also provided  flow in the bypass would be hooded into the anterior tibial artery.  The peroneal artery however provided more blood flow to the foot and ankle but was heavily diseased proximally.   A separate and distinct tibioperoneal trunk and peroneal artery endarterectomy would also be performed to improve the tibial blood flow separate from the femoral to popliteal bypass. At this point, I bluntly dissected a space between the femoral condyles adjacent to the below-the-knee popliteal vessels.  I bluntly passed the long metal tunneler between the femoral condyles in a subsartorial fashion to the groin incision.  The bullet on the tunneler was removed and then the conduit was sewn to the inner cannula with a 2-0 Silk.  I passed the conduit through the metal tunnel, taking care to maintain the orientation of the conduit.   At this point, the patient was given 7000 units of Heparin intravenously.  To perform the peroneal and tibioperoneal trunk endarterectomy, we harvested a piece of saphenous vein from the femoral incision about 8 to 10 cm long for patch angioplasty.  This was marked for orientation and the veins were removed after splitting the vein to open it.  The peroneal artery was controlled distally with Vesseloops and the tibioperoneal trunk was controlled proximally with vessel loop.  An anterior wall arteriotomy was created with an 11 blade and extended with Potts scissors.  This was taken from the proximal to mid tibioperoneal trunk down to the proximal peroneal artery about 3 to 4 cm beyond its origin.  The vessel was heavily diseased and an endarterectomy was created with a freer elevator.  The distal endpoint of the endarterectomy was tacked down with 7-0 Prolene sutures.  The saphenous vein patch was then brought onto the field and started proximally after cutting and beveling the saphenous vein.  A 6-0 Prolene was used to create the anastomosis and the suture line was run one half the length medially and laterally.  The vein was then cut and beveled to an appropriate length to match the arteriotomy distally and a second 6-0 Prolene was started at the distal endpoint.  This was run  medially and laterally and then filled with heparinized saline.    We then turned our attention back to the proximal zone at the femoral artery.  The external iliac artery, superficial femoral artery and profunda femoral artery were clamped.  An incision was made in the common femoral artery and extended proximally and distally with a Potts scissor.  The incision was taken down into the profunda femoris artery about 2 to 3 cm due to the heavily diseased profunda femoris artery.  The freer elevator was then brought on the field and endarterectomy was performed in the profunda femoris artery going back into the distal common femoral artery.  A nice distal endpoint was created with gentle traction and Potts scissors and tacked down with a total of three 7-0 Prolene sutures in the profunda femoris artery.  The specimen was sent off as the left profunda femoris and common femoral plaque.   The proximal conduit was cut and bevelled to match the arteriotomy.  The conduit was sewn to the common femoral artery with a running CV-6 suture.  Prior to completing this anastomosis, all vessels were backbled.  No thrombus was noted from any vessels and backbleeding was good.  The anastomosis was completed in the usual fashion.  Attention was then turned to the popliteal exposure.   We reset the exposure of the popliteal space.  We verified  the popliteal artery was appropriately marked.  We determine the target segment for the anastomosis and the distal popliteal artery and proximal tibioperoneal trunk to the Distaflo graft.  I applied tension to control the artery proximally and distally with vessel loops.  We made an incision with a 11-blade in the artery and extended it proximally and distally with a Potts scissor.  We pulled the conduit to appropriate tension and length, taking into account straightening out the leg.  We adjusted the length of the conduit sharply.  The conduit was sewn to the popliteal artery with a  running CV-6 suture.  Prior to completing this anastomosis, all vessels were backbled. No thrombus was noted from any vessels and backbleeding was good.  The bypass conduit was allowed to bleed in an antegrade fashion with excellent pulsatile flow.  The anastomosis was completed in the usual fashion.  At this point, all incisions were washed out and Fibrillar and Vistacel were placed into both incisions.    At this point, bleeding in both incisions were controlled with electrocautery and suture ligature.  The calf incision was closed with interrupted deep sutures to reapproximate the deep muscles and a running stitch of 3-0 Vicryl in the subcutaneous tissue.  The skin was reapproximated with staples. .  Attention was turned to the groin.  The groin was repaired with a double layer of 2-0 Vicryl immediately superficial to the bypass conduit.  The superficial subcutaneous tissue was reapproximated with two layers of 3-0 Vicryl.  The skin was reapproximated with staples. An incisional VAC was placed on the femoral incision.  Sterile dressing was placed on the distal incision.   The patient was then awakened from anesthesia and taken to the recovery room in stable condition having tolerated the procedure well.     COMPLICATIONS: none  CONDITION: stable   Leotis Pain 06/09/2022 2:33 PM  This note was created with Dragon Medical transcription system. Any errors in dictation are purely unintentional.

## 2022-06-09 NOTE — Interval H&P Note (Signed)
History and Physical Interval Note:  06/09/2022 7:20 AM  Michele Matthews  has presented today for surgery, with the diagnosis of left PAD.  The various methods of treatment have been discussed with the patient and family. After consideration of risks, benefits and other options for treatment, the patient has consented to  Procedure(s): BYPASS GRAFT FEMORAL-TIBIAL ARTERY (Left) APPLICATION OF CELL SAVER (N/A) as a surgical intervention.  The patient's history has been reviewed, patient examined, no change in status, stable for surgery.  I have reviewed the patient's chart and labs.  Questions were answered to the patient's satisfaction.     Hortencia Pilar

## 2022-06-09 NOTE — Anesthesia Procedure Notes (Addendum)
Arterial Line Insertion Performed by: anesthesiologist   Procedure performed using ultrasound guided technique.

## 2022-06-09 NOTE — Anesthesia Preprocedure Evaluation (Addendum)
Anesthesia Evaluation  Patient identified by MRN, date of birth, ID band Patient awake    Reviewed: Allergy & Precautions, NPO status , Patient's Chart, lab work & pertinent test results  History of Anesthesia Complications Negative for: history of anesthetic complications  Airway Mallampati: III  TM Distance: >3 FB Neck ROM: full    Dental  (+) Poor Dentition, Missing   Pulmonary COPD, Current Smoker   Pulmonary exam normal        Cardiovascular Exercise Tolerance: Poor hypertension, On Medications and On Home Beta Blockers + CAD, + Peripheral Vascular Disease and +CHF  Normal cardiovascular exam  ECHO 06/01/2022 IMPRESSIONS     1. Left ventricular ejection fraction, by estimation, is 40 to 45%. The  left ventricle has mildly decreased function. The left ventricle  demonstrates global hypokinesis. There is severe concentric left  ventricular hypertrophy. Left ventricular diastolic   parameters are consistent with Grade I diastolic dysfunction (impaired  relaxation).   2. Right ventricular systolic function is normal. The right ventricular  size is normal.   3. 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  Left atrial size was severely dilated.   4. Right atrial size was moderately dilated.   5. The mitral valve is normal in structure. Mild mitral valve  regurgitation. No evidence of mitral stenosis.   6. The aortic valve is normal in structure. Aortic valve regurgitation is  not visualized. Aortic valve sclerosis/calcification is present, without  any evidence of aortic stenosis.   7. The inferior vena cava is normal in size with greater than 50%  respiratory variability, suggesting right atrial pressure of 3 mmHg.      Neuro/Psych Seizures -,  PSYCHIATRIC DISORDERS Anxiety Depression    CVA (L hemiparesis), Residual Symptoms    GI/Hepatic Neg liver ROS,GERD  ,,  Endo/Other  negative endocrine  ROS    Renal/GU      Musculoskeletal   Abdominal   Peds  Hematology  (+) Blood dyscrasia, anemia   Anesthesia Other Findings Past Medical History: No date: Allergy No date: COPD (chronic obstructive pulmonary disease) (HCC) No date: Coronary artery disease No date: Depression No date: Emphysema of lung (HCC) No date: GERD (gastroesophageal reflux disease) No date: Hyperlipidemia No date: Hypertension No date: Seizures ([place]) No date: Stroke [facility])  Past Surgical History: 09/08/2021: COLONOSCOPY WITH PROPOFOL; N/A     Comment:  Procedure: COLONOSCOPY WITH PROPOFOL;  Surgeon: [place],               Benay Pike, MD;  Location: [facility] ENDOSCOPY;  Service:               Gastroenterology;  Laterality: N/A; 08/10/2015: ESOPHAGOGASTRODUODENOSCOPY; N/A     Comment:  Procedure: ESOPHAGOGASTRODUODENOSCOPY (EGD);  Surgeon:               Manya Silvas, MD;  Location: [facility] ENDOSCOPY;                Service: Endoscopy;  Laterality: N/A; 09/08/2021: ESOPHAGOGASTRODUODENOSCOPY; N/A     Comment:  Procedure: ESOPHAGOGASTRODUODENOSCOPY (EGD);  Surgeon:               [place], Benay Pike, MD;  Location: [facility] ENDOSCOPY;                Service: Gastroenterology;  Laterality: N/A; 12/11/2014: ESOPHAGOGASTRODUODENOSCOPY (EGD) WITH PROPOFOL; N/A     Comment:  Procedure: ESOPHAGOGASTRODUODENOSCOPY (EGD) WITH  PROPOFOL;  Surgeon: Lucilla Lame, MD;  Location: [facility]               ENDOSCOPY;  Service: Endoscopy;  Laterality: N/A; 12/29/2015: ESOPHAGOGASTRODUODENOSCOPY (EGD) WITH PROPOFOL; N/A     Comment:  Procedure: ESOPHAGOGASTRODUODENOSCOPY (EGD) WITH               PROPOFOL;  Surgeon: Mauri Pole, MD;  Location: [facility]              ENDOSCOPY;  Service: Endoscopy;  Laterality: N/A; 11/25/2020: LOWER EXTREMITY ANGIOGRAPHY; Left     Comment:  Procedure: Lower Extremity Angiography;  Surgeon: Algernon Huxley, MD;  Location: [place] [facility];  Service:               Cardiovascular;   Laterality: Left; 11/30/2020: LOWER EXTREMITY ANGIOGRAPHY; Left     Comment:  Procedure: Lower Extremity Angiography;  Surgeon: Algernon Huxley, MD;  Location: [place] [facility];  Service:               Cardiovascular;  Laterality: Left; 05/31/2022: LOWER EXTREMITY ANGIOGRAPHY; Left     Comment:  Procedure: Lower Extremity Angiography;  Surgeon:               Katha Cabal, MD;  Location: [place] [facility];               Service: Cardiovascular;  Laterality: Left; 12/02/2020: TRANSMETATARSAL AMPUTATION; Left     Comment:  Procedure: TRANSMETATARSAL AMPUTATION;  Surgeon: Caroline More, DPM;  Location: [facility] ORS;  Service: Podiatry;                Laterality: Left;  BMI    Body Mass Index: 21.13 kg/m      Reproductive/Obstetrics negative OB ROS                             Anesthesia Physical Anesthesia Plan  ASA: 4  Anesthesia Plan: General ETT   Post-op Pain Management: Ofirmev IV (intra-op)*, Toradol IV (intra-op)* and Dilaudid IV   Induction: Intravenous  PONV Risk Score and Plan: 3 and Ondansetron, Dexamethasone and Treatment may vary due to age or medical condition  Airway Management Planned: Oral ETT  Additional Equipment: Arterial line  Intra-op Plan:   Post-operative Plan: Extubation in OR  Informed Consent: I have reviewed the patients History and Physical, chart, labs and discussed the procedure including the risks, benefits and alternatives for the proposed anesthesia with the patient or authorized representative who has indicated his/her understanding and acceptance.     Dental Advisory Given  Plan Discussed with: Anesthesiologist, CRNA and Surgeon  Anesthesia Plan Comments: (Patient consented for risks of anesthesia including but not limited to:  - adverse reactions to medications - damage to eyes, teeth, lips or other oral mucosa - nerve damage due to positioning  - sore throat or hoarseness -  Damage to heart, brain, nerves, lungs, other parts of body or loss of life  Patient voiced understanding.)        Anesthesia Quick Evaluation

## 2022-06-09 NOTE — Progress Notes (Signed)
OT Cancellation Note  Patient Details Name: Michele Matthews MRN: [id number] DOB: 1945/08/29   Cancelled Treatment:    Reason Eval/Treat Not Completed: Patient at procedure or test/ unavailable. Pt off the floor at vascular surgery.   [name] 06/09/2022, 10:11 AM

## 2022-06-09 NOTE — Op Note (Signed)
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 and Algernon Huxley, MD  ASSISTANT(S):   ANESTHESIA: general  ESTIMATED BLOOD LOSS: 400 cc  FINDING(S): None   SPECIMEN(S): Plaque from the common femoral profunda femoris and tibioperoneal trunk and peroneal arteries  INDICATIONS:   Michele Matthews is a 77 y.o. female who presents with ulceration of her left forefoot. The risk, benefits, and alternative for bypass operations were discussed with the patient.  The patient is aware the risks include but are not limited to: bleeding, infection, myocardial infarction, stroke, limb loss, nerve damage, need for additional procedures in the future, wound complications, and inability to complete the bypass.  The patient is voices understanding of these risks and agreed to proceed.  DESCRIPTION: After informed consent was obtained, the patient was brought back to the operating room and placed in the supine position.  Prior to induction, the patient was given intravenous antibiotics.  After general anesthesia is induced, the patient was prepped and draped in the standard fashion for a femoral to popliteal bypass operation.  Appropriate timeout is called.  Co-surgeons are required because this is a complex multilevel procedure with work being performed simultaneously from both the patient's right and left sides.  This also expedites the procedure making a shorter operative time reducing complications and improving patient safety.   Attention was turned to the right groin.  A longitudinal incision  was made over the right common femoral artery.  Using blunt dissection and electrocautery, the artery was dissected circumferentially from the inguinal ligament down to the femoral bifurcation.  The superficial femoral artery, profunda femoral artery, and distal external iliac artery are looped with Silastic vessel loops.  Circumflex branches were also dissected and controlled with vessel loops as needed.     Medial incision was then made in the calf and the soft tissue explored for the saphenous vein.  Several very small veins were identified but nothing that would be suitable for a bypass.  Given that her saphenous vein is not acceptable we moved forward with plans for a prosthetic.  The medial incision was then carried deeper through the fascia reflecting the gastroc muscles posteriorly and the popliteal artery and vein identified.  The distal popliteal artery was then dissected circumferentially as was the origin of the anterior tibial.  Carrying the dissection distally along the artery we circumferentially dissected the tibial peroneal trunk and the proximal 15 mm of peroneal artery.  Once all these structures were dissected circumferentially they were then looped with Silastic vessel loops. A tunneler was then passed and a Bard distal flow graft was then pulled through the tunnel.   The patient was given 6000 units of Heparin intravenously (an additional 2000 units of heparin was given during the case), and this is allowed to circulate for proximally 5 minutes.  The prosthetic bypass is planned to be brought down to the distal popliteal utilizing the anterior tibial as the runoff.  In order to improve the perfusion of the patient's foot I elected to perform an endarterectomy of the tibioperoneal trunk and proximal peroneal.  This was separate and distinct from the anastomosis of the bypass graft.  An 11 blade was then used to create an arteriotomy in the tibioperoneal trunk which was carried proximally  and distally and under direct visualization a freer elevator used to perform endarterectomy of the tibioperoneal trunk and origin of the peroneal.  A piece of saphenous vein from the groin was then harvested opened longitudinally and applied as a vein patch angioplasty to the tibioperoneal trunk peroneal.  The patch was sewn with 6-0 Prolene in running fashion using 2 sutures 1 at each end and tying them in the midportion.  Flushing maneuvers were performed and prior to complete the anastomosis it was flushed with heparinized saline.  Attention was then turned to the common femoral and profunda femoris.  The external iliac artery, superficial femoral artery, and profunda femoral artery were then clamped along with any circumflex branches.  Arteriotomy is then made in the common femoral artery with an 11 blade and extended with Potts scissors. Arteriotomy is extended into the profunda femoris. The profunda femoris artery was found to be severely atherosclerotic with a large amount of plaque present.  This artery was not needed to perform the bypass as it is separate from the common femoral.  Endarterectomy was then performed of the profunda femoris under direct visualization with a freer elevator.  Several 7-0 Prolene tacking sutures were then used to secure the intimal edge.  Once this had been accomplished attention was returned to the common femoral where the anastomosis of the bypass graft would be fashioned.  Endarterectomy was then performed under direct visualization beginning in the proximal common femoral artery and extending down to the origin of the SFA.    The proximal extent of the bypass vein was trimmed to the appropriate shape.  The PTFE graft was sewn to the common femoral artery in an end-to-side configuration with a running stitch of Gore CV 6 suture.  Prior to completing the suture line the anastomosis is flushed and subsequently the suture line is completed. Flow was then reestablished to the  profunda femoris artery.  The anastomosis is checked for leaks and the vein graft is clamped proximally.  Subsequently, Surgicel was placed around the suture line. Attention was then turned to the distal end of the graft.  I released the clamp and pulsatile bleeding from the graft was evident.  I reclamped the graft near the arterial anastomosis.  The conduit was irrigated with heparinized saline.    Attention was then turned to the distal popliteal target site. The leg was straightened and final measurements were made. Arteriotomy was made with a 11-blade and extended with Potts scissors.  The distal end of the conduit was then approximated to the arteriotomy and the arteriotomy adjusted for length.  The distal flow graft was then sewn using running CV 6 suture.  Flushing maneuvers were performed and flow was established first retrograde into the popliteal and then into the anterior tibial runoff.  After inspecting the anastomosis for hemostasis, Surgicel was placed around the suture line.   The wounds were then irrigated with sterile saline.  The wounds were then inspected for hemostasis. Bleeding points were controlled with electrocautery, and suture repair of active bleeding points.  Fibrillar and Vistaseal were then placed in the bed of the wounds. Once hemostasis was achieved.  The groin incision was then closed in multiple layers using both 2-0 and 3-0 Vicryl in interrupted and running fashion. Skin was closed with a staples. The medial calf and medial thigh wound were then closed layers using  3-0 Vicryl,  and staples.   A disposable Prevena VAC dressing was applied to the groin incision.  A honeycomb dressing was applied to the calf incision.   COMPLICATIONS: None  CONDITION: Stable  Katha Cabal, M.D. [place] [facility] Office: [phone]  06/09/2022, 3:22 PM

## 2022-06-09 NOTE — Anesthesia Procedure Notes (Signed)
Procedure Name: Intubation Date/Time: 06/09/2022 8:54 AM  Performed by: Otho Perl, CRNAPre-anesthesia Checklist: Patient identified, Patient being monitored, Timeout performed, Emergency Drugs available and Suction available Patient Re-evaluated:Patient Re-evaluated prior to induction Oxygen Delivery Method: Circle system utilized Preoxygenation: Pre-oxygenation with 100% oxygen Induction Type: IV induction Ventilation: Mask ventilation without difficulty Laryngoscope Size: McGraph and 4 Grade View: Grade II Tube type: Oral Tube size: 7.0 mm Number of attempts: 1 Airway Equipment and Method: Stylet Placement Confirmation: ETT inserted through vocal cords under direct vision, positive ETCO2 and breath sounds checked- equal and bilateral Secured at: 21 cm Tube secured with: Tape Dental Injury: Teeth and Oropharynx as per pre-operative assessment

## 2022-06-09 NOTE — Progress Notes (Signed)
[facility] Progress Note  [name]    TDD:[id number]  DOA: 05/26/2022     Date of Service: the patient was seen and examined on 06/09/2022  Chief Complaint  Patient presents with   Wound Infection   Brief hospital course: Michele Matthews is a 77 y.o. 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  She was found to have a left heel wound with infection.  Podiatry consult obtained, patient started antibiotics with Rocephin and vancomycin. PAD, seen by vascular surgery, scheduled surgery today.     Assessment and Plan: Left foot infection:  Peripheral arterial disease. S/p left foot TMA done on 11/30/2020 No evidence of sepsis.  Patient has been seen by podiatry, s/p vancomycin, Flagyl and Rocephin.  Transition to doxycycline for 10 days on 06/04/22 Patient was not able to tolerate MRI even give her dose of Ativan.  Currently pending ABI test to determine the next course of action.  Given high dose of oxycodone prior to the ABI testing as patient has unable to tolerate. Discussed with Dr. Posey Pronto, patient does not need debridement.  Recommend antibiotic treatment and follow-up with wound care. Vascular surgery consulted, s/p LLE angiogram,  Diffuse atherosclerotic changes of the left lower extremity, occlusion of the SFA stents as well as the SFA and the popliteal in its entirety.  There appears to be anterior tibial runoff however the this does not appear to cross the ankle. Continue Plavix Wound care RN consulted 2/1 d/w vascular surgery, patient is a bypass surgery which will take around 5 hours. 2/3 as per podiatry, antibiotics can be changed to oral doxycycline for 10 days.   2/4 patient and family decided for LLE bypass, vascular surgery is aware, waiting for surgical  plan. 2/8 s/p LLE bypass surgery, developed postop hypotension, patient was started on phenylephrine and dopamine and nitroglycerin infusions Patient was transferred to ICU   Acute blood loss anemia, post-op  Hb 11.0--8.8 Continue monitor and transfuse if hemoglobin less than 7    Chronic combined systolic and diastolic heart failure ([place]): 2D echo on 07/24/2017 showed EF 25 to 30% with grade 2 diastolic dysfunction.  Patient does not have any short of breath, BNP 284, patient does not have exacerbation congestive heart failure.   Appear euvolemic. Continued Lasix 20 mg twice daily, Coreg 3.125 twice daily, lisinopril 5 mg twice daily home dose. 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  Right atrium moderately dilated. 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 For  myxoma, just needs to be monitored if no symptoms.  And can follow-up as an outpatient with cardiology for further management plans down the road.   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   COPD (chronic obstructive pulmonary disease): Stable   History of stroke with left hemiparesis. Continue home medicines.  1/31 c/o right-sided weakness since admission which is new 1/31 CT head: Hypodensity in the left aspect of the pons and midbrain may reflect artifact; however, acute infarct is not excluded. Recommend brain MRI for further evaluation. Patient is refusing  MRI, cannot tolerate it  Continue monitor on telemetry, continue neurocheck every 4 hourly Cautions fall and aspiration precautions Ambulate with assistance, frequent turning every 8 hourly TSH level 3.15 2/1 D/w patient's husband over the phone that if he is still concerned then we can  repeat CT scan of the head.  He will let us know.  Patient is unable to understand her medical situation.  Isotonic hyponatremia Serum osmolality 285 Na 131--132--133 Monitor sodium level daily   Coronary artery disease:  S/p Plavix 300 po once, followed by Plavix 75 mg p.o. daily   Essential hypertension On Coreg and lisinopril   Depression with anxiety -Continue home medications 2/1 started Xanax as needed  Tobacco abuse -nicotine patch Advised to quit.    Body mass index is 21.94 kg/m.  Interventions:       Diet: Heart healthy diet DVT Prophylaxis: Subcutaneous Heparin    Advance goals of care discussion: Full code  Family Communication: family was not present at bedside, at the time of interview.  The pt provided permission to discuss medical plan with the family. Opportunity was given to ask question and all questions were answered satisfactorily.  2/2 discussed with patient's husband over the phone. 2/3 discussed with patient's husband and her daughter over the phone 2/4 d/w patient husband over the phone, agreed to consult palliative care but wanted her to be full code for now  Disposition:  Pt is from Home, admitted with left foot infection, peripheral vascular disease, still has PVD, vascular surgery following, needs LLE bypass sx, which precludes a safe discharge. Discharge to SNF vs HH, when clinically stable. 2/3 patient's family visited her and decided to move forward for LLE bypass surgery.  Vascular surgery was informed, plan for LLE bypass surgery on 06/09/2022   Subjective: No significant events overnight, patient was seen after bypass surgery in the ICU, pain is under control, due to low blood pressure patient was transferred to ICU close monitoring.    Physical Exam: General: NAD, resting comfortably Appear in no distress, affect appropriate Eyes: PERRLA ENT: Oral Mucosa Clear, moist  Neck: no JVD,  Cardiovascular: S1 and S2 Present, no Murmur,   Respiratory: good respiratory effort, Bilateral Air entry equal and Decreased, no Crackles, no wheezes Abdomen: Bowel Sound present, Soft and no tenderness,  Skin: no rashes Extremities: no Pedal edema, no calf tenderness, left foot dressing CDI Neurologic: Residual weakness on the left side.  Right arm no weakness, right leg is weak. Gait not checked due to patient safety concerns  Vitals:   06/09/22 0429 06/09/22 0455 06/09/22 0721 06/09/22 1430  BP: 113/61  (!) 116/54   Pulse: 92  91   Resp: 18  16   Temp: 98.3 F (36.8 C)  98.6 F (37 C) (!) 97.1 F (36.2 C)  TempSrc: Oral  Temporal   SpO2: 93%  94%   Weight:  57.6 kg 57.6 kg   Height:   '5\' 5"'$  (1.651 m)     Intake/Output Summary (Last 24 hours) at 06/09/2022 1447 Last data filed at 06/09/2022 1356 Gross per 24 hour  Intake 2136.25 ml  Output 1370 ml  Net 766.25 ml   Filed Weights   06/08/22 0442 06/09/22 0455 06/09/22 0721  Weight: 57.4 kg 57.6 kg 57.6 kg    Data Reviewed: I have personally reviewed and interpreted daily labs, tele strips, imagings as discussed above. I reviewed all nursing notes, pharmacy notes, vitals, pertinent old records I have discussed plan of care as  described above with RN and patient/family.  CBC: Recent Labs  Lab 06/04/22 0317 06/05/22 0448 06/06/22 0330 06/07/22 1049 06/09/22 0413  WBC 11.2* 10.6* 9.8 11.1* 10.8*  HGB 10.0* 10.5* 10.5* 10.6* 11.0*  HCT 32.1* 33.3* 33.0* 33.8* 34.9*  MCV 87.7 87.9 86.6 87.3 87.7  PLT 212 230 263 297 144   Basic Metabolic Panel: Recent Labs  Lab 06/03/22 0321 06/04/22 0630 06/05/22 0448 06/06/22 0330 06/07/22 1049 06/09/22 0413  NA 135 131* 132* 133* 133* 135  K 3.5 3.7 3.8 4.2 4.1 4.1  CL 100 97* 97* 97* 97* 99  CO2 '27 26 28 27 28 25  '$ GLUCOSE 94 96 97 91 112* 94  BUN '18 19 20 '$ 26* 27* 28*  CREATININE 0.60 0.62 0.67 0.70 0.76 0.75  CALCIUM 8.4* 8.7* 9.1 9.1 9.3 9.4  MG 2.1 1.9 2.1  --   --  2.0  PHOS 3.0 3.0 3.4  --   --  4.3     Studies: No results found.  Scheduled Meds:  [MAR Hold] atorvastatin  20 mg Oral Daily   [MAR Hold] busPIRone  7.5 mg Oral BID   [MAR Hold] carvedilol  3.125 mg Oral BID WC   [MAR Hold] Chlorhexidine Gluconate Cloth  6 each Topical Once   [MAR Hold] clopidogrel  75 mg Oral Q breakfast   [MAR Hold] doxycycline  100 mg Oral Q12H   [MAR Hold] feeding supplement  237 mL Oral TID BM   [MAR Hold] furosemide  20 mg Oral Daily   [MAR Hold] heparin  5,000 Units Subcutaneous Q8H   [MAR Hold] lisinopril  5 mg Oral Daily   [MAR Hold] multivitamin with minerals  1 tablet Oral Daily   [MAR Hold] nicotine  21 mg Transdermal Daily   [MAR Hold] pantoprazole  20 mg Oral Daily   phenylephrine       [MAR Hold] sertraline  100 mg Oral Daily   [MAR Hold] sodium chloride flush  3 mL Intravenous Q12H   Continuous Infusions:  [MAR Hold] sodium chloride     sodium chloride 75 mL/hr at 06/09/22 0247    ceFAZolin (ANCEF) IV     nitroGLYCERIN     PRN Meds: [MAR Hold] sodium chloride, [MAR Hold] acetaminophen, [MAR Hold] acetaminophen, [MAR Hold] albuterol, [MAR Hold] ALPRAZolam, [MAR Hold] dextromethorphan-guaiFENesin, [MAR Hold] diclofenac Sodium, [MAR Hold] fentaNYL (SUBLIMAZE) injection, fentaNYL (SUBLIMAZE) injection, [MAR Hold] hydrALAZINE, [MAR Hold] hydrOXYzine, [MAR Hold] melatonin, [MAR Hold]  morphine injection, [MAR Hold]  morphine injection, nitroGLYCERIN, [MAR Hold] ondansetron (ZOFRAN) IV, [MAR Hold] ondansetron (ZOFRAN) IV, [MAR Hold] ondansetron (ZOFRAN) IV, oxyCODONE **OR** oxyCODONE, [MAR Hold] oxyCODONE, [MAR Hold] oxyCODONE-acetaminophen, phenylephrine, [MAR Hold] senna-docusate, [MAR Hold] sodium chloride flush, [MAR Hold] tetrahydrozoline  Time spent: 35 minutes  Author: Val Riles. MD [facility] 06/09/2022 2:47 PM  To reach On-call, see care teams to locate the attending and reach out to them via [email or web address]. If 7PM-7AM, please contact night-coverage If you still have  difficulty reaching the attending provider, please page the [facility] (Director on Call) for [facility] on amion for assistance.

## 2022-06-09 NOTE — Progress Notes (Addendum)
PT Cancellation Note  Patient Details Name: PENNYE BEEGHLY MRN: [id number] DOB: August 26, 1945   Cancelled Treatment:    Reason Eval/Treat Not Completed: Patient at procedure or test/unavailable Pt out of room all morning for vascular procedure, in CCU on bedrest for this evening.  Have updated PT orders to re-initiate 2/9.  Kreg Shropshire, DPT 06/09/2022, 14:43

## 2022-06-09 NOTE — Plan of Care (Signed)

## 2022-06-09 NOTE — Transfer of Care (Signed)
Immediate Anesthesia Transfer of Care Note  [name]  Procedure(s) Performed: BYPASS GRAFT FEMORAL-TIBIAL ARTERY (Left) APPLICATION OF CELL SAVER  Patient Location: PACU  Anesthesia Type:General  Level of Consciousness: awake  Airway & Oxygen Therapy: Patient Spontanous Breathing and Patient connected to face mask oxygen  Post-op Assessment: Report given to RN and Post -op Vital signs reviewed and unstable, Anesthesiologist notified  Post vital signs: Reviewed  Last Vitals:  Vitals Value Taken Time  BP 83/57 06/09/22 1442  Temp 36.2 C 06/09/22 1430  Pulse 88 06/09/22 1443  Resp 19 06/09/22 1443  SpO2 95 % 06/09/22 1443  Vitals shown include unvalidated device data.  Last Pain:  Vitals:   06/09/22 0721  TempSrc: Temporal  PainSc: 0-No pain      Patients Stated Pain Goal: 3 (75/33/91 7921)  Complications: No notable events documented.

## 2022-06-10 ENCOUNTER — Encounter: Payer: Self-pay | Admitting: Vascular Surgery

## 2022-06-10 DIAGNOSIS — I5042 Chronic combined systolic (congestive) and diastolic (congestive) heart failure: Secondary | ICD-10-CM | POA: Diagnosis not present

## 2022-06-10 DIAGNOSIS — L089 Local infection of the skin and subcutaneous tissue, unspecified: Secondary | ICD-10-CM | POA: Diagnosis not present

## 2022-06-10 LAB — CBC
HCT: 27.1 % — ABNORMAL LOW (ref 36.0–46.0)
Hemoglobin: 8.2 g/dL — ABNORMAL LOW (ref 12.0–15.0)
MCH: 27.3 pg (ref 26.0–34.0)
MCHC: 30.3 g/dL (ref 30.0–36.0)
MCV: 90.3 fL (ref 80.0–100.0)
Platelets: 352 10*3/uL (ref 150–400)
RBC: 3 MIL/uL — ABNORMAL LOW (ref 3.87–5.11)
RDW: 15.9 % — ABNORMAL HIGH (ref 11.5–15.5)
WBC: 18.9 10*3/uL — ABNORMAL HIGH (ref 4.0–10.5)
nRBC: 0 % (ref 0.0–0.2)

## 2022-06-10 LAB — BASIC METABOLIC PANEL
Anion gap: 6 (ref 5–15)
Anion gap: 9 (ref 5–15)
BUN: 28 mg/dL — ABNORMAL HIGH (ref 8–23)
BUN: 32 mg/dL — ABNORMAL HIGH (ref 8–23)
CO2: 21 mmol/L — ABNORMAL LOW (ref 22–32)
CO2: 24 mmol/L (ref 22–32)
Calcium: 8 mg/dL — ABNORMAL LOW (ref 8.9–10.3)
Calcium: 8.4 mg/dL — ABNORMAL LOW (ref 8.9–10.3)
Chloride: 109 mmol/L (ref 98–111)
Chloride: 98 mmol/L (ref 98–111)
Creatinine, Ser: 0.7 mg/dL (ref 0.44–1.00)
Creatinine, Ser: 0.93 mg/dL (ref 0.44–1.00)
GFR, Estimated: >60 mL/min (ref >60)
GFR, Estimated: >60 mL/min (ref >60)
Glucose, Bld: 106 mg/dL — ABNORMAL HIGH (ref 70–99)
Glucose, Bld: 182 mg/dL — ABNORMAL HIGH (ref 70–99)
Potassium: 4.3 mmol/L (ref 3.5–5.1)
Potassium: 4.4 mmol/L (ref 3.5–5.1)
Sodium: 131 mmol/L — ABNORMAL LOW (ref 135–145)
Sodium: 136 mmol/L (ref 135–145)

## 2022-06-10 LAB — PREPARE RBC (CROSSMATCH)

## 2022-06-10 LAB — TROPONIN I (HIGH SENSITIVITY)
Troponin I (High Sensitivity): 79 ng/L — ABNORMAL HIGH (ref <18)
Troponin I (High Sensitivity): 120 ng/L (ref <18)
Troponin I (High Sensitivity): 169 ng/L (ref <18)

## 2022-06-10 LAB — HEMOGLOBIN AND HEMATOCRIT, BLOOD
HCT: 23.8 % — ABNORMAL LOW (ref 36.0–46.0)
Hemoglobin: 7.5 g/dL — ABNORMAL LOW (ref 12.0–15.0)

## 2022-06-10 LAB — MAGNESIUM: Magnesium: 2.1 mg/dL (ref 1.7–2.4)

## 2022-06-10 MED ORDER — BUSPIRONE HCL 15 MG PO TABS: 7.5000 mg | ORAL_TABLET | Freq: Two times a day (BID) | ORAL | Status: DC

## 2022-06-10 MED ORDER — HYDROMORPHONE HCL 1 MG/ML IJ SOLN
1.0000 mg | INTRAMUSCULAR | Status: DC | PRN
  Administered 2022-06-10 – 2022-06-13 (×11): 1 mg via INTRAVENOUS
  Filled 2022-06-10 (×11): qty 1

## 2022-06-10 MED ORDER — ASPIRIN 81 MG PO TBEC
81.0000 mg | DELAYED_RELEASE_TABLET | Freq: Every day | ORAL | Status: DC
  Administered 2022-06-10 – 2022-06-14 (×5): 81 mg via ORAL
  Filled 2022-06-10 (×5): qty 1

## 2022-06-10 MED ORDER — BUSPIRONE HCL 15 MG PO TABS
7.5000 mg | ORAL_TABLET | Freq: Two times a day (BID) | ORAL | Status: DC
  Administered 2022-06-10 – 2022-06-14 (×9): 7.5 mg via ORAL
  Filled 2022-06-10 (×9): qty 1

## 2022-06-10 NOTE — Progress Notes (Signed)
Progress Note    06/10/2022 11:59 AM 1 Day Post-Op  Subjective:  Michele Matthews is a 77 yo female now POD #1 from a Left femoral to below-knee popliteal bypass with Left profunda femoris artery endarterectomy and left tibioperoneal trunk and peroneal artery endarterectomy and vein patch angioplasty.  Operatively she developed some hypotension and was started on phenylephrine and dopamine and nitroglycerin infusions.  On exam today patient is resting comfortably in bed in ICU this morning.  Patient was only on phenylephrine this morning.  Nursing was weaning off phenylephrine as patient's blood pressure was within normal limits.  Patient states that her left leg feels better this morning.  She states that she does not have the pain she had prior to surgery.  No other complaints overnight.  Vitals all remained stable.   Vitals:   06/10/22 0954 06/10/22 1000  BP:  96/80  Pulse: (!) 106 (!) 108  Resp: (!) 23 16  Temp:    SpO2: 97% 95%   Physical Exam: Cardiac:  RRR No murmur S1 and S2 present  Lungs:  good respiratory effort, Bilateral Air entry equal and Decreased, no Crackles, no wheezes  Incisions:  left foot dressing CDI and changed this morning, LLE postop honeycomb dressing some dry blood noticed, otherwise clean   Extremities:  no Pedal edema, no calf tenderness, LLE warm to touch with strong doppler pulses.  Abdomen:  Bowel Sound present, Soft and no tenderness  Neurologic: AAOX3 answers all question appropriately.   CBC    Component Value Date/Time   WBC 18.9 (H) 06/09/2022 2350   RBC 3.00 (L) 06/09/2022 2350   HGB 8.2 (L) 06/09/2022 2350   HGB 12.1 03/27/2014 1730   HCT 27.1 (L) 06/09/2022 2350   HCT 37.0 03/27/2014 1730   PLT 352 06/09/2022 2350   PLT 175 03/27/2014 1730   MCV 90.3 06/09/2022 2350   MCV 97 03/27/2014 1730   MCH 27.3 06/09/2022 2350   MCHC 30.3 06/09/2022 2350   RDW 15.9 (H) 06/09/2022 2350   RDW 13.3 03/27/2014 1730   LYMPHSABS 1.2 05/26/2022 1354    LYMPHSABS 1.9 11/25/2013 0401   MONOABS 0.6 05/26/2022 1354   MONOABS 0.7 11/25/2013 0401   EOSABS 0.2 05/26/2022 1354   EOSABS 0.2 11/25/2013 0401   BASOSABS 0.0 05/26/2022 1354   BASOSABS 0.0 11/25/2013 0401    BMET    Component Value Date/Time   NA 136 06/09/2022 2350   NA 140 03/28/2014 0441   K 4.4 06/09/2022 2350   K 4.0 03/28/2014 0441   CL 109 06/09/2022 2350   CL 110 (H) 03/28/2014 0441   CO2 21 (L) 06/09/2022 2350   CO2 24 03/28/2014 0441   GLUCOSE 182 (H) 06/09/2022 2350   GLUCOSE 86 03/28/2014 0441   BUN 32 (H) 06/09/2022 2350   BUN 19 (H) 03/28/2014 0441   CREATININE 0.93 06/09/2022 2350   CREATININE 0.79 11/24/2015 0908   CALCIUM 8.0 (L) 06/09/2022 2350   CALCIUM 8.7 03/28/2014 0441   GFRNONAA >60 06/09/2022 2350   GFRNONAA 76 11/24/2015 0908   GFRAA >60 07/28/2017 0507   GFRAA 87 11/24/2015 0908    INR    Component Value Date/Time   INR 1.0 05/26/2022 1354   INR 2.0 03/30/2014 0336     Intake/Output Summary (Last 24 hours) at 06/10/2022 1159 Last data filed at 06/10/2022 [id number] Gross per 24 hour  Intake 3388.11 ml  Output 1255 ml  Net 2133.11 ml     Assessment/Plan:  77 y.o. female is s/p Left femoral to below-knee popliteal bypass with Left profunda femoris artery endarterectomy and left tibioperoneal trunk and peroneal artery endarterectomy and vein patch angioplasty.  1 Day Post-Op   PLAN: Patient recovering as expected. Started on ASA 81 mg po and Plavix 75 mg po QD today.  Continue to monitor BP and wean off Phenylephrine this morning.  Draw CBC and if HGB less than 8.0 give 1 unit PRBC's. HGB was 7.5 Patient received 1 unit PRBC's Monitor Left Groin Prevena Vac for bleeding.  Provide post op pain control.  DVT prophylaxis:  Lovenox, asa and plavix.   Drema Pry [facility] 06/10/2022 11:59 AM

## 2022-06-10 NOTE — Evaluation (Signed)
Occupational Therapy Re-Evaluation Patient Details Name: Michele Matthews MRN: [id number] DOB: 1946-03-16 Today's Date: 06/10/2022   History of Present Illness Michele Matthews is a 77 y.o. 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  She was found to have a left heel wound with infection. Vascular consulted with plan for angiogram, at risk for amputation. Pt has been to rehab after past admission, generally negative experience per family with perceived decline in function by time to DC. S/P vascular surgery 2/8-fem/pop bypass, endarterectomy.  WBAT.   Clinical Impression   Pt was seen for OT/PT re-evaluation this date. Pt premedicated for session however continues to be very anxious for mobility. Pt notably talking to spouse who is not present in room stating "I'm so glad to see you here Darrell". Pt currently requires SETUP self-feeding/drinking, limited by baseline visual deficits. Anticipate MAX A pericare bed level. MAX A for LBD in sitting. Goals remain appropriate. Upon hospital discharge, recommend STR to maximize pt safety and return to PLOF.    Recommendations for follow up therapy are one component of a multi-disciplinary discharge planning process, led by the attending physician.  Recommendations may be updated based on patient status, additional functional criteria and insurance authorization.   Follow Up Recommendations  Skilled nursing-short term rehab (<3 hours/day)     Assistance Recommended at Discharge Frequent or constant Supervision/Assistance  Patient can return home with the following Two people to help with walking and/or transfers;Two people to help with bathing/dressing/bathroom;Help with stairs or ramp for entrance;Assistance with cooking/housework;Assist for  transportation    Functional Status Assessment  Patient has had a recent decline in their functional status and demonstrates the ability to make significant improvements in function in a reasonable and predictable amount of time.  Equipment Recommendations  Hospital bed    Recommendations for Other Services       Precautions / Restrictions Precautions Precautions: Fall Restrictions Weight Bearing Restrictions: Yes LLE Weight Bearing: Weight bearing as tolerated Other Position/Activity Restrictions: in surgical shoe      Mobility Bed Mobility Overal bed mobility: Needs Assistance Bed Mobility: Supine to Sit Rolling: Min assist   Supine to sit: Min assist Sit to supine: Min assist        Transfers                   General transfer comment: pt refused      Balance Overall balance assessment: Needs assistance Sitting-balance support: Bilateral upper extremity supported, Feet unsupported Sitting balance-Leahy Scale: Fair Sitting balance - Comments: able to maintian sitting with distant supervision today.                                   ADL either performed or assessed with clinical judgement   ADL Overall ADL's : Needs assistance/impaired                                       General ADL Comments: SETUP self-feeding/drinking, limited by baseline visual deficits. Anticipate MAX A pericare bed level. MAX A for LBD in sitting.        Pertinent Vitals/Pain Pain Assessment Pain Assessment: Faces Faces Pain  Scale: Hurts little more Pain Location: L foot, head Pain Descriptors / Indicators: Crying, Grimacing, Guarding, Headache Pain Intervention(s): Limited activity within patient's tolerance, Repositioned, Premedicated before session     Hand Dominance Left   Extremity/Trunk Assessment Upper Extremity Assessment Upper Extremity Assessment: Generalized weakness   Lower Extremity Assessment Lower Extremity Assessment:  Generalized weakness   Cervical / Trunk Assessment Cervical / Trunk Assessment: Normal   Communication Communication Communication: HOH   Cognition Arousal/Alertness: Awake/alert Behavior During Therapy: Agitated, Anxious Overall Cognitive Status: No family/caregiver present to determine baseline cognitive functioning Area of Impairment: Following commands, Safety/judgement                       Following Commands: Follows one step commands inconsistently, Follows one step commands with increased time Safety/Judgement: Decreased awareness of safety, Decreased awareness of deficits   Problem Solving: Slow processing, Requires verbal cues, Requires tactile cues, Decreased initiation General Comments: pt talking to spouse who is not present in room stating "I'm so glad to see you here Darrell". Resistive to mobility and very anxious to being movied, improved participation with education      Home Living Family/patient expects to be discharged to:: Private residence Living Arrangements: Spouse/significant other Available Help at Discharge: Family Type of Home: House Home Access: Stairs to enter Technical brewer of Steps: 3   [place]: One level     Bathroom Shower/Tub: Teacher, early years/pre: Standard Bathroom Accessibility: Yes   Home Equipment: Conservation officer, nature (2 wheels);Rollator (4 wheels);Tub bench;Wheelchair - manual;Cane - single point          Prior Functioning/Environment Prior Level of Function : Needs assist             Mobility Comments: Spouse provides sup asssits to ambualte with sup at household level ambualtin  with RW. ADLs Comments: Mod to max assit for bathing and sup fro toileting. Spouse cooks meals.        OT Problem List: Decreased strength;Decreased activity tolerance;Decreased range of motion;Impaired balance (sitting and/or standing);Decreased safety awareness;Pain      OT Treatment/Interventions:  Self-care/ADL training;Therapeutic exercise;DME and/or AE instruction;Energy conservation;Therapeutic activities;Patient/family education;Balance training    OT Goals(Current goals can be found in the care plan section) Acute Rehab OT Goals Patient Stated Goal: go home OT Goal Formulation: With patient Time For Goal Achievement: 06/24/22 Potential to Achieve Goals: Good  OT Frequency: Min 2X/week    Co-evaluation PT/OT/SLP Co-Evaluation/Treatment: Yes Reason for Co-Treatment: Necessary to address cognition/behavior during functional activity;For patient/therapist safety;To address functional/ADL transfers PT goals addressed during session: Mobility/safety with mobility;Balance        AM-PAC OT "6 Clicks" Daily Activity     Outcome Measure Help from another person eating meals?: None Help from another person taking care of personal grooming?: A Little Help from another person toileting, which includes using toliet, bedpan, or urinal?: A Lot Help from another person bathing (including washing, rinsing, drying)?: A Lot Help from another person to put on and taking off regular upper body clothing?: A Little Help from another person to put on and taking off regular lower body clothing?: A Lot 6 Click Score: 16   End of Session    Activity Tolerance: Patient tolerated treatment well Patient left: in bed;with call bell/phone within reach  OT Visit Diagnosis: Other abnormalities of gait and mobility (R26.89);Muscle weakness (generalized) (M62.81)                Time: [id number] OT Time  Calculation (min): 15 min Charges:  OT General Charges $OT Visit: 1 Visit OT Evaluation $OT Re-eval: 1 Re-eval  Dessie Coma, M.S. OTR/L  06/10/22, 10:12 AM  ascom [phone]

## 2022-06-10 NOTE — Evaluation (Signed)
Physical Therapy Evaluation Patient Details Name: Michele Matthews MRN: [id number] DOB: Aug 09, 1945 Today's Date: 06/10/2022  History of Present Illness  Michele Matthews is a 77 y.o. 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  She was found to have a left heel wound with infection. Vascular consulted with plan for angiogram, at risk for amputation. Pt has been to rehab after past admission, generally negative experience per family with perceived decline in function by time to DC. S/P vascular surgery 2/8-fem/pop bypass, endarterectomy.  WBAT.   Clinical Impression  Patient received in supine, RN at bedside to give pain medicine. Patient is agitated, and VERY anxious with any mention of moving. Requires calming techniques to settle. She is able to get herself to the edge of the bed with min A when she is willing to move. Otherwise she is resistant to movement limited by her anxiety. Patient is also HOH. She will continue to benefit from skilled PT as able for improved independence and safety with mobility.         Recommendations for follow up therapy are one component of a multi-disciplinary discharge planning process, led by the attending physician.  Recommendations may be updated based on patient status, additional functional criteria and insurance authorization.  Follow Up Recommendations Skilled nursing-short term rehab (<3 hours/day) Can patient physically be transported by private vehicle: No    Assistance Recommended at Discharge Frequent or constant Supervision/Assistance  Patient can return home with the following  Two people to help with walking and/or transfers;Two people to help with bathing/dressing/bathroom;Assistance with cooking/housework;Assistance with feeding;Direct  supervision/assist for medications management;Assist for transportation;Help with stairs or ramp for entrance    Equipment Recommendations BSC/3in1  Recommendations for Other Services       Functional Status Assessment Patient has had a recent decline in their functional status and demonstrates the ability to make significant improvements in function in a reasonable and predictable amount of time.     Precautions / Restrictions Precautions Precautions: Fall Restrictions Weight Bearing Restrictions: Yes LLE Weight Bearing: Weight bearing as tolerated Other Position/Activity Restrictions: in surgical shoe      Mobility  Bed Mobility Overal bed mobility: Needs Assistance Bed Mobility: Supine to Sit Rolling: Min assist   Supine to sit: Min assist Sit to supine: Min assist   General bed mobility comments: poor effort/cooperation this session. VERY anxious.    Transfers                   General transfer comment: not attempted    Ambulation/Gait               General Gait Details: deferred  Stairs            Wheelchair Mobility    Modified Rankin (Stroke Patients Only)       Balance Overall balance assessment: Needs assistance Sitting-balance support: Bilateral upper extremity supported, Feet unsupported Sitting balance-Leahy Scale: Fair Sitting balance - Comments: able to maintian sitting with distant supervision today.                                     Pertinent Vitals/Pain Pain Assessment Pain Assessment: Faces Faces Pain Scale: Hurts little more Pain Location: L foot, head Pain  Descriptors / Indicators: Discomfort, Guarding, Grimacing Pain Intervention(s): Monitored during session, Premedicated before session, Repositioned    Home Living Family/patient expects to be discharged to:: Private residence Living Arrangements: Spouse/significant other Available Help at Discharge: Family Type of Home: House Home Access:  Stairs to enter   Technical brewer of Steps: 3   Home Layout: One level Home Equipment: Conservation officer, nature (2 wheels);Rollator (4 wheels);Tub bench;Wheelchair - manual;Cane - single point      Prior Function Prior Level of Function : Needs assist             Mobility Comments: Spouse provides sup asssits to ambualte with sup at household level ambualtin  with RW. ADLs Comments: Mod to max assit for bathing and sup fro toileting. Spouse cooks meals.     Hand Dominance   Dominant Hand: Left    Extremity/Trunk Assessment   Upper Extremity Assessment Upper Extremity Assessment: Defer to OT evaluation    Lower Extremity Assessment Lower Extremity Assessment: Difficult to assess due to impaired cognition    Cervical / Trunk Assessment Cervical / Trunk Assessment: Normal  Communication   Communication: HOH  Cognition Arousal/Alertness: Awake/alert Behavior During Therapy: Agitated, Anxious Overall Cognitive Status: No family/caregiver present to determine baseline cognitive functioning Area of Impairment: Following commands, Safety/judgement, Awareness, Problem solving, Attention                 Orientation Level: Disoriented to, Situation Current Attention Level: Sustained   Following Commands: Follows one step commands inconsistently, Follows one step commands with increased time Safety/Judgement: Decreased awareness of safety, Decreased awareness of deficits Awareness: Emergent Problem Solving: Slow processing, Requires verbal cues, Requires tactile cues, Decreased initiation General Comments: patient VERY anxious this date. Resisting mobility. yelling out. Then agreeable and assists.        General Comments      Exercises     Assessment/Plan    PT Assessment Patient needs continued PT services  PT Problem List Decreased strength;Decreased activity tolerance;Decreased balance;Decreased mobility;Decreased coordination;Decreased knowledge of use of  DME;Decreased safety awareness;Decreased knowledge of precautions;Pain;Decreased skin integrity;Cardiopulmonary status limiting activity;Decreased range of motion;Decreased cognition       PT Treatment Interventions Gait training;Functional mobility training;Therapeutic activities;Therapeutic exercise;Balance training;Neuromuscular re-education;Patient/family education;Cognitive remediation;DME instruction;Stair training    PT Goals (Current goals can be found in the Care Plan section)  Acute Rehab PT Goals Patient Stated Goal: patient unable to state PT Goal Formulation: Patient unable to participate in goal setting Time For Goal Achievement: 06/17/22    Frequency Min 2X/week     Co-evaluation   Reason for Co-Treatment: Complexity of the patient's impairments (multi-system involvement);Necessary to address cognition/behavior during functional activity;For patient/therapist safety;To address functional/ADL transfers PT goals addressed during session: Mobility/safety with mobility;Balance         AM-PAC PT "6 Clicks" Mobility  Outcome Measure Help needed turning from your back to your side while in a flat bed without using bedrails?: A Little Help needed moving from lying on your back to sitting on the side of a flat bed without using bedrails?: A Little Help needed moving to and from a bed to a chair (including a wheelchair)?: Total Help needed standing up from a chair using your arms (e.g., wheelchair or bedside chair)?: Total Help needed to walk in hospital room?: Total Help needed climbing 3-5 steps with a railing? : Total 6 Click Score: 10    End of Session   Activity Tolerance: Patient limited by pain;Other (comment);Treatment limited secondary to agitation (anxiety) Patient  left: in bed;with call bell/phone within reach Nurse Communication: Mobility status;Weight bearing status PT Visit Diagnosis: Other abnormalities of gait and mobility (R26.89);Pain Pain - Right/Left:  Left Pain - part of body: Ankle and joints of foot    Time: [id number] PT Time Calculation (min) (ACUTE ONLY): 15 min   Charges:   PT Evaluation $PT Eval Moderate Complexity: 1 Mod          Swanson Farnell, PT, GCS 06/10/22,10:02 AM

## 2022-06-10 NOTE — Progress Notes (Signed)
PIV consult: Unable to assess LUE due to pt report of pain, holding arm stiff to her side. Long 22g placed R forearm on second attempt. Please consider PICC or central line if otherwise appropriate (limited venous access options).

## 2022-06-10 NOTE — Anesthesia Postprocedure Evaluation (Signed)
Anesthesia Post Note  [name]  Procedure(s) Performed: BYPASS GRAFT FEMORAL-TIBIAL ARTERY (Left) APPLICATION OF CELL SAVER  Patient location during evaluation: SICU Anesthesia Type: General Level of consciousness: awake Pain management: pain level controlled Vital Signs Assessment: post-procedure vital signs reviewed and stable Respiratory status: spontaneous breathing and nonlabored ventilation Cardiovascular status: stable and tachycardic Postop Assessment: no apparent nausea or vomiting Anesthetic complications: no   No notable events documented.   Last Vitals:  Vitals:   06/10/22 0800 06/10/22 0834  BP: (!) 87/54 (!) 88/59  Pulse: (!) 104 (!) 107  Resp: (!) 36 (!) 22  Temp:  36.9 C  SpO2: 94% 94%    Last Pain:  Vitals:   06/10/22 0834  TempSrc: Oral  PainSc: 0-No pain                 Lia Foyer

## 2022-06-10 NOTE — Progress Notes (Signed)
[facility] Progress Note  [name]    [id number]  DOA: 05/26/2022     Date of Service: the patient was seen and examined on 06/10/2022  Chief Complaint  Patient presents with   Wound Infection   Brief hospital course: Michele Matthews is a 77 y.o. 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  She was found to have a left heel wound with infection.  Podiatry consult obtained, patient started antibiotics with Rocephin and vancomycin. PAD, seen by vascular surgery, scheduled surgery today.     Assessment and Plan: Left foot infection:  Peripheral arterial disease. S/p left foot TMA done on 11/30/2020 No evidence of sepsis.  Patient has been seen by podiatry, s/p vancomycin, Flagyl and Rocephin.  Transition to doxycycline for 10 days on 06/04/22 Patient was not able to tolerate MRI even give her dose of Ativan.  Currently pending ABI test to determine the next course of action.  Given high dose of oxycodone prior to the ABI testing as patient has unable to tolerate. Discussed with Dr. Posey Pronto, patient does not need debridement.  Recommend antibiotic treatment and follow-up with wound care. Vascular surgery consulted, s/p LLE angiogram,  Diffuse atherosclerotic changes of the left lower extremity, occlusion of the SFA stents as well as the SFA and the popliteal in its entirety.  There appears to be anterior tibial runoff however the this does not appear to cross the ankle. Continue Plavix Wound care RN consulted 2/1 d/w vascular surgery, patient is a bypass surgery which will take around 5 hours. 2/3 as per podiatry, antibiotics can be changed to oral doxycycline for 10 days.   2/4 patient and family decided for LLE bypass, vascular surgery is aware, waiting for surgical  plan. 2/8 s/p LLE bypass surgery, developed postop hypotension, patient was started on phenylephrine and dopamine and nitroglycerin infusions Patient was transferred to ICU   Acute blood loss anemia, post-op  Hb 11.0--8.8--8.2--7.5 2/9 Hb 7.5, 1 unit PRBC transfusion was ordered as per vascular surgery Continue to monitor and transfuse prn  Chronic combined systolic and diastolic heart failure ([place]): 2D echo on 07/24/2017 showed EF 25 to 30% with grade 2 diastolic dysfunction.  Patient does not have any short of breath, BNP 284, patient does not have exacerbation congestive heart failure.   Appear euvolemic. Continued Lasix 20 mg twice daily, Coreg 3.125 twice daily, lisinopril 5 mg twice daily home dose. 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  Right atrium moderately dilated. 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 For  myxoma, just needs to be monitored if no symptoms.  And can follow-up as an outpatient with cardiology for further management plans down the road.   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   COPD (chronic obstructive pulmonary disease): Stable   History of stroke with left hemiparesis. Continue home medicines.  1/31 c/o right-sided weakness since admission which is new 1/31 CT head: Hypodensity in the left aspect of the pons and midbrain may reflect artifact; however, acute infarct is not excluded. Recommend  brain MRI for further evaluation. Patient is refusing MRI, cannot tolerate it  Continue monitor on telemetry, continue neurocheck every 4 hourly Cautions fall and aspiration precautions Ambulate with assistance, frequent turning every 8 hourly TSH level 3.15 2/1 D/w patient's  husband over the phone that if he is still concerned then we can repeat CT scan of the head.  He will let us know.  Patient is unable to understand her medical situation.  Isotonic hyponatremia Serum osmolality 285 Na 131--132--133 Monitor sodium level daily   Coronary artery disease:  S/p Plavix 300 po once, followed by Plavix 75 mg p.o. daily   Essential hypertension Pt was on Coreg and lisinopril, which has been d/c'd due to postop hypotension Continue to monitor BP and resume medications when needed.   Depression with anxiety -Continue home medications 2/1 started Xanax as needed  Tobacco abuse Smoking cessation counseling done    Body mass index is 21.94 kg/m.  Interventions:       Diet: Heart healthy diet DVT Prophylaxis: Subcutaneous Heparin    Advance goals of care discussion: Full code  Family Communication: family was not present at bedside, at the time of interview.  The pt provided permission to discuss medical plan with the family. Opportunity was given to ask question and all questions were answered satisfactorily.  2/2 discussed with patient's husband over the phone. 2/3 discussed with patient's husband and her daughter over the phone 2/4 d/w patient husband over the phone, agreed to consult palliative care but wanted her to be full code for now  Disposition:  Pt is from Home, admitted with left foot infection, peripheral vascular disease, still has PVD, vascular surgery following, s/p LLE bypass sx done on 2/8, developed low BP on IV phenylephrine and dopamine, transferred to ICU, which precludes a safe discharge. Discharge to SNF vs HH, when clinically stable.   Subjective: No significant events overnight, left lower extremity pain is under control, patient denied any active issues, patient was resting comfortably in the bed.  No chest pain or palpitation, no shortness of breath.  Physical Exam: General: NAD, resting comfortably Appear in no  distress, affect appropriate Eyes: PERRLA ENT: Oral Mucosa Clear, moist  Neck: no JVD,  Cardiovascular: S1 and S2 Present, no Murmur,  Respiratory: good respiratory effort, Bilateral Air entry equal and Decreased, no Crackles, no wheezes Abdomen: Bowel Sound present, Soft and no tenderness,  Skin: no rashes Extremities: no Pedal edema, no calf tenderness, left foot dressing CDI, LLE postop honeycomb dressing some dry blood noticed, otherwise clean  Neurologic: Residual weakness on the left side.  Right arm no weakness, right leg is weak. Gait not checked due to patient safety concerns  Vitals:   06/10/22 1200 06/10/22 1300 06/10/22 1347 06/10/22 1400  BP: 121/78 127/73 (!) 108/58 (!) 124/57  Pulse: (!) 108  (!) 109 (!) 106  Resp: 16 18 (!) 23 18  Temp:   98.2 F (36.8 C)   TempSrc:   Oral   SpO2: 99%  98% 96%  Weight:      Height:        Intake/Output Summary (Last 24 hours) at 06/10/2022 1456 Last data filed at 06/10/2022 1348 Gross per 24 hour  Intake 3374.32 ml  Output 1935 ml  Net 1439.32 ml   Filed Weights   06/08/22 0442 06/09/22 0455 06/09/22 0721  Weight: 57.4 kg 57.6 kg 57.6 kg    Data Reviewed: I have personally reviewed and interpreted daily labs, tele strips, imagings as  discussed above. I reviewed all nursing notes, pharmacy notes, vitals, pertinent old records I have discussed plan of care as described above with RN and patient/family.  CBC: Recent Labs  Lab 06/06/22 0330 06/07/22 1049 06/09/22 0413 06/09/22 1701 06/09/22 2350 06/10/22 1344  WBC 9.8 11.1* 10.8* 22.5* 18.9*  --   HGB 10.5* 10.6* 11.0* 8.8* 8.2* 7.5*  HCT 33.0* 33.8* 34.9* 29.0* 27.1* 23.8*  MCV 86.6 87.3 87.7 90.3 90.3  --   PLT 263 297 328 384 352  --    Basic Metabolic Panel: Recent Labs  Lab 06/04/22 0630 06/05/22 0448 06/06/22 0330 06/07/22 1049 06/09/22 0413 06/09/22 2350 06/10/22 0500  NA 131* 132* 133* 133* 135 136  --   K 3.7 3.8 4.2 4.1 4.1 4.4  --   CL 97* 97*  97* 97* 99 109  --   CO2 26 28 27 28 25 $ 21*  --   GLUCOSE 96 97 91 112* 94 182*  --   BUN 19 20 26* 27* 28* 32*  --   CREATININE 0.62 0.67 0.70 0.76 0.75 0.93  --   CALCIUM 8.7* 9.1 9.1 9.3 9.4 8.0*  --   MG 1.9 2.1  --   --  2.0  --  2.1  PHOS 3.0 3.4  --   --  4.3  --   --     Studies: No results found.  Scheduled Meds:  busPIRone  7.5 mg Oral BID   Chlorhexidine Gluconate Cloth  6 each Topical Daily   clopidogrel  75 mg Oral Q breakfast   docusate sodium  100 mg Oral Daily   doxycycline  100 mg Oral Q12H   enoxaparin (LOVENOX) injection  40 mg Subcutaneous Q24H   feeding supplement  237 mL Oral TID BM   furosemide  20 mg Oral Daily   multivitamin with minerals  1 tablet Oral Daily   sertraline  100 mg Oral Daily   Continuous Infusions:  sodium chloride 75 mL/hr at 06/10/22 1348   sodium chloride     DOPamine Stopped (06/09/22 1617)   famotidine (PEPCID) IV Stopped (06/10/22 1049)   magnesium sulfate bolus IVPB     nitroGLYCERIN     phenylephrine (NEO-SYNEPHRINE) Adult infusion 20 mcg/min (06/10/22 1348)   PRN Meds: sodium chloride, acetaminophen **OR** acetaminophen, alum & mag hydroxide-simeth, dextromethorphan-guaiFENesin, diclofenac Sodium, diphenhydrAMINE, famotidine, fentaNYL (SUBLIMAZE) injection, guaiFENesin-dextromethorphan, hydrALAZINE, hydrALAZINE, HYDROmorphone (DILAUDID) injection, hydrOXYzine, labetalol, magnesium sulfate bolus IVPB, melatonin, methylPREDNISolone (SOLU-MEDROL) injection, metoprolol tartrate, midazolam, morphine injection, ondansetron (ZOFRAN) IV, ondansetron, mouth rinse, oxyCODONE-acetaminophen, phenol, potassium chloride, senna-docusate, senna-docusate, sorbitol, tetrahydrozoline  Time spent: 35 minutes  Author: Val Riles. MD [facility] 06/10/2022 2:56 PM  To reach On-call, see care teams to locate the attending and reach out to them via [email or web address]. If 7PM-7AM, please contact night-coverage If you still have difficulty reaching  the attending provider, please page the [facility] (Director on Call) for [facility] on amion for assistance.

## 2022-06-10 NOTE — Progress Notes (Signed)
SUBJECTIVE: Patient is a 77 y.o. female with medical history significant of s/p of transmetatarsal amputation of left foot on 11/30/20, PAD, hypertension, hyperlipidemia, COPD, CAD, CHF with EF 25 to 30%, stroke, depression with anxiety, upper GI bleeding, who presented to the ED on 05/26/22 with left foot pain.  She was found to have a left heel wound with infection.  Podiatry consult obtained, patient started antibiotics with Rocephin and vancomycin.    Left lower extremity angiogram completed today revealed diffuse atherosclerotic changes of the left lower extremity, occlusion of the SFA stents as well as the SFA and the popliteal in its entirety, anterior tibial runoff however the this does not appear to cross the ankle.   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   Patient underwent left fem-pop bypass, profunda femoris endarterectomy, and tibioperoneal trunk and peroneal endarterectomy on 06/09/22.   Vitals:   06/10/22 0630 06/10/22 0700 06/10/22 0800 06/10/22 0834  BP: (!) 116/48 (!) 118/47 (!) 87/54 (!) 88/59  Pulse: 98 97 (!) 104 (!) 107  Resp: 15 (!) 21 (!) 36 (!) 22  Temp:    98.4 F (36.9 C)  TempSrc:    Oral  SpO2: 90% 93% 94% 94%  Weight:      Height:        Intake/Output Summary (Last 24 hours) at 06/10/2022 0908 Last data filed at 06/10/2022 [id number] Gross per 24 hour  Intake 4488.11 ml  Output 1705 ml  Net 2783.11 ml    LABS: Basic Metabolic Panel: Recent Labs    06/09/22 0413 06/09/22 2350 06/10/22 0500  NA 135 136  --   K 4.1 4.4  --   CL 99 109  --   CO2 25 21*  --   GLUCOSE 94 182*  --   BUN 28* 32*  --   CREATININE 0.75 0.93  --   CALCIUM 9.4 8.0*  --   MG 2.0  --  2.1  PHOS 4.3  --   --    Liver Function Tests: No results for input(s): "AST", "ALT", "ALKPHOS", "BILITOT", "PROT", "ALBUMIN" in the  last 72 hours. No results for input(s): "LIPASE", "AMYLASE" in the last 72 hours. CBC: Recent Labs    06/09/22 1701 06/09/22 2350  WBC 22.5* 18.9*  HGB 8.8* 8.2*  HCT 29.0* 27.1*  MCV 90.3 90.3  PLT 384 352   Cardiac Enzymes: No results for input(s): "CKTOTAL", "CKMB", "CKMBINDEX", "TROPONINI" in the last 72 hours. BNP: Invalid input(s): "POCBNP" D-Dimer: No results for input(s): "DDIMER" in the last 72 hours. Hemoglobin A1C: No results for input(s): "HGBA1C" in the last 72 hours. Fasting Lipid Panel: No results for input(s): "CHOL", "HDL", "LDLCALC", "TRIG", "CHOLHDL", "LDLDIRECT" in the last 72 hours. Thyroid Function Tests: No results for input(s): "TSH", "T4TOTAL", "T3FREE", "THYROIDAB" in the last 72 hours.  Invalid input(s): "FREET3" Anemia Panel: No results for input(s): "VITAMINB12", "FOLATE", "FERRITIN", "TIBC", "IRON", "RETICCTPCT" in the last 72 hours.   PHYSICAL EXAM General: Well developed, well nourished, in no acute distress HEENT:  Normocephalic and atramatic Neck:  No JVD.  Lungs: Clear bilaterally to auscultation and percussion. Heart: HRRR . Normal S1 and S2 without gallops or murmurs.  Abdomen: Bowel sounds are positive, abdomen soft and non-tender  Msk:  Back normal, normal gait. Normal strength and tone for age. Extremities: No clubbing,  cyanosis or edema.   Neuro: Alert and oriented X 3. Psych:  Good affect, responds appropriately  TELEMETRY: sinus tachycardia  ASSESSMENT AND PLAN: Patient resting comfortably in bed. Denies chest pain, shortness of breath. 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 Right atrium moderately dilated. Will need outpatient follow up for monitoring of myxoma. Will continue to follow.   Principal Problem:   Left foot infection Active Problems:   COPD (chronic obstructive pulmonary  disease) ([facility])   Coronary artery disease   Tobacco abuse   Essential hypertension   Chronic combined systolic and diastolic heart failure ([facility])   PAD (peripheral artery disease) (H[facility])   HLD (hyperlipidemia)   Depression with anxiety   Leg edema, right   Hemiparesis affecting left side as late effect of cerebrovascular accident (CVA) ([place])    Rhiley Solem, FNP-C 06/10/2022 9:08 AM

## 2022-06-11 DIAGNOSIS — L089 Local infection of the skin and subcutaneous tissue, unspecified: Secondary | ICD-10-CM | POA: Diagnosis not present

## 2022-06-11 LAB — PHOSPHORUS: Phosphorus: 1.9 mg/dL — ABNORMAL LOW (ref 2.5–4.6)

## 2022-06-11 LAB — CBC
HCT: 26.8 % — ABNORMAL LOW (ref 36.0–46.0)
Hemoglobin: 8.4 g/dL — ABNORMAL LOW (ref 12.0–15.0)
MCH: 26.7 pg (ref 26.0–34.0)
MCHC: 31.3 g/dL (ref 30.0–36.0)
MCV: 85.1 fL (ref 80.0–100.0)
Platelets: 269 10*3/uL (ref 150–400)
RBC: 3.15 MIL/uL — ABNORMAL LOW (ref 3.87–5.11)
RDW: 17.4 % — ABNORMAL HIGH (ref 11.5–15.5)
WBC: 12 10*3/uL — ABNORMAL HIGH (ref 4.0–10.5)
nRBC: 0 % (ref 0.0–0.2)

## 2022-06-11 LAB — BASIC METABOLIC PANEL
Anion gap: 8 (ref 5–15)
BUN: 20 mg/dL (ref 8–23)
CO2: 25 mmol/L (ref 22–32)
Calcium: 8.6 mg/dL — ABNORMAL LOW (ref 8.9–10.3)
Chloride: 102 mmol/L (ref 98–111)
Creatinine, Ser: 0.7 mg/dL (ref 0.44–1.00)
GFR, Estimated: >60 mL/min (ref >60)
Glucose, Bld: 107 mg/dL — ABNORMAL HIGH (ref 70–99)
Potassium: 4.7 mmol/L (ref 3.5–5.1)
Sodium: 135 mmol/L (ref 135–145)

## 2022-06-11 LAB — TROPONIN I (HIGH SENSITIVITY): Troponin I (High Sensitivity): 318 ng/L (ref <18)

## 2022-06-11 LAB — MAGNESIUM: Magnesium: 1.8 mg/dL (ref 1.7–2.4)

## 2022-06-11 MED ORDER — POTASSIUM PHOSPHATES 15 MMOLE/5ML IV SOLN
15.0000 mmol | Freq: Once | INTRAVENOUS | Status: AC
  Administered 2022-06-11: 15 mmol via INTRAVENOUS
  Filled 2022-06-11: qty 5

## 2022-06-11 MED ORDER — FAMOTIDINE 20 MG PO TABS
20.0000 mg | ORAL_TABLET | Freq: Two times a day (BID) | ORAL | Status: DC
  Administered 2022-06-11 – 2022-06-14 (×6): 20 mg via ORAL
  Filled 2022-06-11 (×6): qty 1

## 2022-06-11 NOTE — Progress Notes (Signed)
Subjective  - POD #2  No complaints   Physical Exam:  Brisk pedal doppler signals Incisions clean and dry Provena with good seal       Assessment/Plan:  POD #2  -Continue aspirin and Plavix -Acute blood loss anemia: Hemoglobin is 8.4 today -Patient is to get out of bed and mobilize  [employer] 06/11/2022 4:23 PM --  Vitals:   06/11/22 1500 06/11/22 1600  BP: 108/68 121/67  Pulse: (!) 108 100  Resp: (!) 28 18  Temp:    SpO2: 98% 99%    Intake/Output Summary (Last 24 hours) at 06/11/2022 1623 Last data filed at 06/11/2022 1600 Gross per 24 hour  Intake 425.89 ml  Output 2565 ml  Net -2139.11 ml     Laboratory CBC    Component Value Date/Time   WBC 12.0 (H) 06/11/2022 0403   HGB 8.4 (L) 06/11/2022 0403   HGB 12.1 03/27/2014 1730   HCT 26.8 (L) 06/11/2022 0403   HCT 37.0 03/27/2014 1730   PLT 269 06/11/2022 0403   PLT 175 03/27/2014 1730    BMET    Component Value Date/Time   NA 135 06/11/2022 0403   NA 140 03/28/2014 0441   K 4.7 06/11/2022 0403   K 4.0 03/28/2014 0441   CL 102 06/11/2022 0403   CL 110 (H) 03/28/2014 0441   CO2 25 06/11/2022 0403   CO2 24 03/28/2014 0441   GLUCOSE 107 (H) 06/11/2022 0403   GLUCOSE 86 03/28/2014 0441   BUN 20 06/11/2022 0403   BUN 19 (H) 03/28/2014 0441   CREATININE 0.70 06/11/2022 0403   CREATININE 0.79 11/24/2015 0908   CALCIUM 8.6 (L) 06/11/2022 0403   CALCIUM 8.7 03/28/2014 0441   GFRNONAA >60 06/11/2022 0403   GFRNONAA 76 11/24/2015 0908   GFRAA >60 07/28/2017 0507   GFRAA 87 11/24/2015 0908    COAG Lab Results  Component Value Date   INR 1.0 05/26/2022   INR 1.2 09/05/2021   INR 1.0 11/25/2020   No results found for: "PTT"  Antibiotics Anti-infectives (From admission, onward)    Start     Dose/Rate Route Frequency Ordered Stop   06/09/22 1700  ceFAZolin (ANCEF) IVPB 2g/100 mL premix        2 g 200 mL/hr over 30 Minutes Intravenous Every 8 hours 06/09/22 1605 06/10/22 0131    06/09/22 0653  ceFAZolin (ANCEF) IVPB 2g/100 mL premix  Status:  Discontinued        2 g 200 mL/hr over 30 Minutes Intravenous 30 min pre-op 06/09/22 0653 06/09/22 1531   06/04/22 1000  doxycycline (VIBRA-TABS) tablet 100 mg        100 mg Oral Every 12 hours 06/04/22 0739 06/14/22 0959   05/31/22 0915  ceFAZolin (ANCEF) IVPB 2g/100 mL premix  Status:  Discontinued        2 g 200 mL/hr over 30 Minutes Intravenous 30 min pre-op 05/31/22 0915 05/31/22 1053   05/27/22 2200  vancomycin (VANCOCIN) IVPB 1000 mg/200 mL premix  Status:  Discontinued        1,000 mg 200 mL/hr over 60 Minutes Intravenous Every 24 hours 05/26/22 1706 05/26/22 1849   05/27/22 0500  metroNIDAZOLE (FLAGYL) IVPB 500 mg  Status:  Discontinued        500 mg 100 mL/hr over 60 Minutes Intravenous Every 12 hours 05/26/22 1628 06/04/22 0739   05/26/22 2000  vancomycin (VANCOCIN) IVPB 1000 mg/200 mL premix  Status:  Discontinued  1,000 mg 200 mL/hr over 60 Minutes Intravenous Every 24 hours 05/26/22 1849 06/04/22 0739   05/26/22 1700  cefTRIAXone (ROCEPHIN) 1 g in sodium chloride 0.9 % 100 mL IVPB  Status:  Discontinued        1 g 200 mL/hr over 30 Minutes Intravenous Every 24 hours 05/26/22 1627 06/04/22 0739   05/26/22 1615  ceFEPIme (MAXIPIME) 2 g in sodium chloride 0.9 % 100 mL IVPB  Status:  Discontinued        2 g 200 mL/hr over 30 Minutes Intravenous  Once 05/26/22 1602 05/26/22 1627   05/26/22 1615  metroNIDAZOLE (FLAGYL) IVPB 500 mg        500 mg 100 mL/hr over 60 Minutes Intravenous  Once 05/26/22 1602 05/26/22 1840   05/26/22 1615  vancomycin (VANCOCIN) IVPB 1000 mg/200 mL premix  Status:  Discontinued        1,000 mg 200 mL/hr over 60 Minutes Intravenous  Once 05/26/22 1602 05/26/22 1849        V. Leia Alf, M.D., [facility] [place] Office: [phone] Pager:  [phone]

## 2022-06-11 NOTE — Progress Notes (Signed)
  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 - ASA, plavix as above   Isotonic hypoNa+ - Resolved   CAD - ASA, plavix as above   HTN  - IV PRN lopressor   Depression and anxiety  - Buspar 7.5 mg PO bid  - Zoloft 100 mg PO daily   DVT prophylaxis: Lovenox 40 mg sq daily  GI prophylaxis: IV pepcid 20 mg q12      Subjective: Pt seen and examined at the bedside. Hgb stable. WBC downtrending. IV phos is being replaced today. Continues with PO doxycycline.   Physical Exam: Vitals:   06/11/22 0500 06/11/22 0600 06/11/22 0700 06/11/22 0742  BP: (!) 128/57 (!) 132/50 127/60   Pulse: 97 99 (!) 103 (!) 103  Resp: (!) 21 (!) 24 (!) 31 14  Temp:    98.5 F (36.9 C)  TempSrc:    Axillary  SpO2: 94% 96% 100% 96%  Weight:      Height:       Physical Exam Constitutional:      Appearance: Normal appearance.  HENT:     Head: Normocephalic and atraumatic.     Nose: Nose normal.  Cardiovascular:     Rate and Rhythm: Normal rate and regular rhythm.  Pulmonary:     Effort: Pulmonary effort is normal.  Abdominal:     General: Abdomen is flat.  Musculoskeletal:     Cervical back: Neck supple.  Skin:    General: Skin is warm.  Neurological:     Mental Status: She is alert. Mental status is at baseline.  Psychiatric:        Mood and Affect: Mood normal.    Data Reviewed:   Disposition: Status is: Inpatient  Planned Discharge Destination:  Home    Time spent: 35 minutes  Author: Lucienne Minks , MD 06/11/2022 11:00 AM  For on call review [email or web address].

## 2022-06-11 NOTE — Progress Notes (Signed)
PHARMACIST - PHYSICIAN COMMUNICATION  CONCERNING: IV to Oral Route Change Policy  RECOMMENDATION: This patient is receiving famotidine by the intravenous route.  Based on criteria approved by the Pharmacy and Therapeutics Committee, the intravenous medication(s) is/are being converted to the equivalent oral dose form(s).   DESCRIPTION: These criteria include: The patient is eating (either orally or via tube) and/or has been taking other orally administered medications for a least 24 hours The patient has no evidence of active gastrointestinal bleeding or impaired GI absorption (gastrectomy, short bowel, patient on TNA or NPO).  If you have questions about this conversion, please contact the [place], [facility] 06/11/2022 12:04 PM

## 2022-06-12 DIAGNOSIS — L089 Local infection of the skin and subcutaneous tissue, unspecified: Secondary | ICD-10-CM | POA: Diagnosis not present

## 2022-06-12 LAB — TYPE AND SCREEN
ABO/RH(D): O NEG
Antibody Screen: POSITIVE
Unit division: 0
Unit division: 0
Unit division: 0

## 2022-06-12 LAB — CBC
HCT: 31.1 % — ABNORMAL LOW (ref 36.0–46.0)
Hemoglobin: 9.7 g/dL — ABNORMAL LOW (ref 12.0–15.0)
MCH: 26.9 pg (ref 26.0–34.0)
MCHC: 31.2 g/dL (ref 30.0–36.0)
MCV: 86.4 fL (ref 80.0–100.0)
Platelets: 312 10*3/uL (ref 150–400)
RBC: 3.6 MIL/uL — ABNORMAL LOW (ref 3.87–5.11)
RDW: 17 % — ABNORMAL HIGH (ref 11.5–15.5)
WBC: 13.7 10*3/uL — ABNORMAL HIGH (ref 4.0–10.5)
nRBC: 0 % (ref 0.0–0.2)

## 2022-06-12 LAB — BPAM RBC
Blood Product Expiration Date: 202402272359
Blood Product Expiration Date: 202402282359
Blood Product Expiration Date: 202402292359
ISSUE DATE / TIME: 202402091613
Unit Type and Rh: 9500
Unit Type and Rh: 9500
Unit Type and Rh: 9500

## 2022-06-12 LAB — PROCALCITONIN: Procalcitonin: <0.1 ng/mL

## 2022-06-12 LAB — BASIC METABOLIC PANEL
Anion gap: 7 (ref 5–15)
BUN: 17 mg/dL (ref 8–23)
CO2: 27 mmol/L (ref 22–32)
Calcium: 9 mg/dL (ref 8.9–10.3)
Chloride: 101 mmol/L (ref 98–111)
Creatinine, Ser: 0.66 mg/dL (ref 0.44–1.00)
GFR, Estimated: >60 mL/min (ref >60)
Glucose, Bld: 98 mg/dL (ref 70–99)
Potassium: 4.5 mmol/L (ref 3.5–5.1)
Sodium: 135 mmol/L (ref 135–145)

## 2022-06-12 NOTE — TOC Progression Note (Addendum)
Transition of Care [facility]) - Progression Note    Patient Details  Name: Michele Matthews MRN: [id number] Date of Birth: Jun 05, 1945  [facility] CM/SW [place], [place] Phone Number: 06/12/2022, 3:41 PM  Clinical Narrative:     CSW spoke with pt's spouse via phone concerning SNF recommendation by PT, he states he plans to take pt home and care for her there. Spouse states services are already set up through [facility] and request any meds be sent to [facility] so that they arrive prior to pt coming home. Spouse states pt was previously in a SNF and pt came home in a terrible state. MD made aware. TOC will continue to follow and assist in discharge needs.         Expected Discharge Plan and Services                                               Social Determinants of Health (SDOH) Interventions SDOH Screenings   Food Insecurity: No Food Insecurity (05/26/2022)  Housing: Low Risk  (05/26/2022)  Transportation Needs: No Transportation Needs (05/26/2022)  Utilities: Not At Risk (05/26/2022)  Financial Resource Strain: Medium Risk (11/08/2021)  Tobacco Use: High Risk (06/10/2022)    Readmission Risk Interventions     No data to display

## 2022-06-12 NOTE — Progress Notes (Signed)
Physical Therapy Treatment Patient Details Name: Michele Matthews MRN: [id number] DOB: 13-Sep-1945 Today's Date: 06/12/2022   History of Present Illness Michele Matthews is a 77 y.o. 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  She was found to have a left heel wound with infection. Vascular consulted with plan for angiogram, at risk for amputation. Pt has been to rehab after past admission, generally negative experience per family with perceived decline in function by time to DC. S/P vascular surgery 2/8-fem/pop bypass, endarterectomy.  WBAT.    PT Comments    Patient is received in bed, she is less anxious and agitated this session compared to last visit on Friday. She is agreeable to bed mobility and a few exercises but does not want to attempt to get into recliner. Patient demonstrates fair strength and I believe she could get to [facility] or recliner with +2 min A if she would agree.  She will continue to benefit from skilled PT to improve functional mobility, strength and independence. Patient limited by poor motivation.     Recommendations for follow up therapy are one component of a multi-disciplinary discharge planning process, led by the attending physician.  Recommendations may be updated based on patient status, additional functional criteria and insurance authorization.  Follow Up Recommendations  Skilled nursing-short term rehab (<3 hours/day) Can patient physically be transported by private vehicle: No   Assistance Recommended at Discharge Frequent or constant Supervision/Assistance  Patient can return home with the following Two people to help with walking and/or transfers;Assistance with cooking/housework;Direct supervision/assist for medications management;Assist for  transportation;Help with stairs or ramp for entrance;A lot of help with bathing/dressing/bathroom   Equipment Recommendations  BSC/3in1    Recommendations for Other Services       Precautions / Restrictions Precautions Precautions: Fall Restrictions Weight Bearing Restrictions: Yes LLE Weight Bearing: Weight bearing as tolerated Other Position/Activity Restrictions: in surgical shoe     Mobility  Bed Mobility Overal bed mobility: Needs Assistance Bed Mobility: Supine to Sit, Sit to Supine     Supine to sit: Min assist Sit to supine: Min assist   General bed mobility comments: increased time, use of bed rails, min A for moving to/from edge of bed.    Transfers                   General transfer comment: pt refused    Ambulation/Gait               General Gait Details: declines attempting   Stairs             Wheelchair Mobility    Modified Rankin (Stroke Patients Only)       Balance Overall balance assessment: Modified Independent Sitting-balance support: Feet unsupported Sitting balance-Leahy Scale: Good Sitting balance - Comments: patient able to sit edge of bed without support, easily fatigued in sitting after a few minutes and begins to have posterior lean. Also reports being hot and sweaty. Postural control: Posterior lean                                  Cognition Arousal/Alertness: Awake/alert Behavior During Therapy: WFL for tasks assessed/performed Overall Cognitive Status: Within Functional Limits for tasks assessed  General Comments: much improved mentation and decreased anxiety this session. Continues to be somewhat self limiting-does not want to get up to recliner        Exercises Other Exercises Other Exercises: LAQ in sitting, SLR in supine 5-10 reps bilaterally    General Comments        Pertinent Vitals/Pain Pain Assessment Pain Assessment:  Faces Faces Pain Scale: Hurts little more Pain Location: L foot Pain Descriptors / Indicators: Discomfort, Sore Pain Intervention(s): Monitored during session, Premedicated before session    Home Living                          Prior Function            PT Goals (current goals can now be found in the care plan section) Acute Rehab PT Goals Patient Stated Goal: none stated PT Goal Formulation: With patient Time For Goal Achievement: 06/17/22 Progress towards PT goals: Not progressing toward goals - comment (-patient is self limiting)    Frequency    Min 2X/week      PT Plan Current plan remains appropriate    Co-evaluation              AM-PAC PT "6 Clicks" Mobility   Outcome Measure  Help needed turning from your back to your side while in a flat bed without using bedrails?: A Little Help needed moving from lying on your back to sitting on the side of a flat bed without using bedrails?: A Little Help needed moving to and from a bed to a chair (including a wheelchair)?: A Lot Help needed standing up from a chair using your arms (e.g., wheelchair or bedside chair)?: A Lot Help needed to walk in hospital room?: Total Help needed climbing 3-5 steps with a railing? : Total 6 Click Score: 12    End of Session   Activity Tolerance: Patient limited by fatigue Patient left: in bed;with call bell/phone within reach;with bed alarm set Nurse Communication: Mobility status PT Visit Diagnosis: Other abnormalities of gait and mobility (R26.89);Pain;Muscle weakness (generalized) (M62.81) Pain - Right/Left: Left Pain - part of body: Ankle and joints of foot     Time: [id number] PT Time Calculation (min) (ACUTE ONLY): 15 min  Charges:  $Therapeutic Activity: 8-22 mins                     Kensy Blizard, PT, [facility] 06/12/22,11:28 AM

## 2022-06-12 NOTE — Progress Notes (Signed)
Subjective  - POD #3  Denies any pain.  She says she was able to sit up in bed   Physical Exam:  Good pedal Doppler signals Incisions healing appropriately. Prevena without evidence of leak       Assessment/Plan:  POD #3  Continue with mobilization.  Hopefully she can go to the floor. Continue aspirin Plavix Acute blood loss anemia: Hemoglobin 9.7 today  Michele Matthews 06/12/2022 7:14 PM --  Vitals:   06/12/22 1700 06/12/22 1800  BP: 129/62 127/63  Pulse: (!) 101 (!) 106  Resp: 20 (!) 25  Temp:    SpO2: (!) 84% 95%    Intake/Output Summary (Last 24 hours) at 06/12/2022 1914 Last data filed at 06/12/2022 0600 Gross per 24 hour  Intake 237 ml  Output 225 ml  Net 12 ml     Laboratory CBC    Component Value Date/Time   WBC 13.7 (H) 06/12/2022 0432   HGB 9.7 (L) 06/12/2022 0432   HGB 12.1 03/27/2014 1730   HCT 31.1 (L) 06/12/2022 0432   HCT 37.0 03/27/2014 1730   PLT 312 06/12/2022 0432   PLT 175 03/27/2014 1730    BMET    Component Value Date/Time   NA 135 06/12/2022 0432   NA 140 03/28/2014 0441   K 4.5 06/12/2022 0432   K 4.0 03/28/2014 0441   CL 101 06/12/2022 0432   CL 110 (H) 03/28/2014 0441   CO2 27 06/12/2022 0432   CO2 24 03/28/2014 0441   GLUCOSE 98 06/12/2022 0432   GLUCOSE 86 03/28/2014 0441   BUN 17 06/12/2022 0432   BUN 19 (H) 03/28/2014 0441   CREATININE 0.66 06/12/2022 0432   CREATININE 0.79 11/24/2015 0908   CALCIUM 9.0 06/12/2022 0432   CALCIUM 8.7 03/28/2014 0441   GFRNONAA >60 06/12/2022 0432   GFRNONAA 76 11/24/2015 0908   GFRAA >60 07/28/2017 0507   GFRAA 87 11/24/2015 0908    COAG Lab Results  Component Value Date   INR 1.0 05/26/2022   INR 1.2 09/05/2021   INR 1.0 11/25/2020   No results found for: "PTT"  Antibiotics Anti-infectives (From admission, onward)    Start     Dose/Rate Route Frequency Ordered Stop   06/09/22 1700  ceFAZolin (ANCEF) IVPB 2g/100 mL premix        2 g 200 mL/hr over 30  Minutes Intravenous Every 8 hours 06/09/22 1605 06/10/22 0131   06/09/22 0653  ceFAZolin (ANCEF) IVPB 2g/100 mL premix  Status:  Discontinued        2 g 200 mL/hr over 30 Minutes Intravenous 30 min pre-op 06/09/22 0653 06/09/22 1531   06/04/22 1000  doxycycline (VIBRA-TABS) tablet 100 mg        100 mg Oral Every 12 hours 06/04/22 0739 06/14/22 0959   05/31/22 0915  ceFAZolin (ANCEF) IVPB 2g/100 mL premix  Status:  Discontinued        2 g 200 mL/hr over 30 Minutes Intravenous 30 min pre-op 05/31/22 0915 05/31/22 1053   05/27/22 2200  vancomycin (VANCOCIN) IVPB 1000 mg/200 mL premix  Status:  Discontinued        1,000 mg 200 mL/hr over 60 Minutes Intravenous Every 24 hours 05/26/22 1706 05/26/22 1849   05/27/22 0500  metroNIDAZOLE (FLAGYL) IVPB 500 mg  Status:  Discontinued        500 mg 100 mL/hr over 60 Minutes Intravenous Every 12 hours 05/26/22 1628 06/04/22 0739   05/26/22 2000  vancomycin (VANCOCIN) IVPB  1000 mg/200 mL premix  Status:  Discontinued        1,000 mg 200 mL/hr over 60 Minutes Intravenous Every 24 hours 05/26/22 1849 06/04/22 0739   05/26/22 1700  cefTRIAXone (ROCEPHIN) 1 g in sodium chloride 0.9 % 100 mL IVPB  Status:  Discontinued        1 g 200 mL/hr over 30 Minutes Intravenous Every 24 hours 05/26/22 1627 06/04/22 0739   05/26/22 1615  ceFEPIme (MAXIPIME) 2 g in sodium chloride 0.9 % 100 mL IVPB  Status:  Discontinued        2 g 200 mL/hr over 30 Minutes Intravenous  Once 05/26/22 1602 05/26/22 1627   05/26/22 1615  metroNIDAZOLE (FLAGYL) IVPB 500 mg        500 mg 100 mL/hr over 60 Minutes Intravenous  Once 05/26/22 1602 05/26/22 1840   05/26/22 1615  vancomycin (VANCOCIN) IVPB 1000 mg/200 mL premix  Status:  Discontinued        1,000 mg 200 mL/hr over 60 Minutes Intravenous  Once 05/26/22 1602 05/26/22 1849        V. Leia Alf, M.D., [facility] [place] Office: [phone] Pager:  [phone]

## 2022-06-12 NOTE — Progress Notes (Signed)
[facility] Progress Note  [name]    [id number]  DOA: 05/26/2022     Date of Service: the patient was seen and examined on 06/12/2022  Chief Complaint  Patient presents with   Wound Infection   Brief hospital course: Michele Matthews is a 77 y.o. 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  She was found to have a left heel wound with infection.  Podiatry consult obtained, patient started antibiotics with Rocephin and vancomycin. PAD, seen by vascular surgery, scheduled surgery today.     Assessment and Plan: Left foot infection:  Peripheral arterial disease. S/p left foot TMA done on 11/30/2020 No evidence of sepsis.  Patient has been seen by podiatry, s/p vancomycin, Flagyl and Rocephin.  Transition to doxycycline for 10 days on 06/04/22 Patient was not able to tolerate MRI even give her dose of Ativan.  s/p high dose of oxycodone prior to the ABI testing as patient has unable to tolerate. Discussed with Dr. Posey Pronto, patient does not need debridement.  Recommend antibiotic treatment and follow-up with wound care. Vascular surgery consulted, s/p LLE angiogram,  Diffuse atherosclerotic changes of the left lower extremity, occlusion of the SFA stents as well as the SFA and the popliteal in its entirety.  There appears to be anterior tibial runoff however the this does not appear to cross the ankle. Continue ASA and Plavix, Wound care RN consulted 2/4 patient and family decided for LLE bypass. Informed to vascular surgery. 2/8 s/p LLE bypass surgery, developed postop hypotension, patient was started on phenylephrine and dopamine and nitroglycerin infusions, and transferred to ICU 2/11 currently off pressor support, BP stable   Acute blood loss anemia, post-op  Hb  11.0--8.8--8.2--7.5, on 2/9 Hb 7.5, 1 unit PRBC transfusion. 2/11 Hb 9.7 stable Continue to monitor and transfuse prn   Chronic combined systolic and diastolic heart failure ([place]): 2D echo on 07/24/2017 showed EF 25 to 30% with grade 2 diastolic dysfunction.  Patient does not have any short of breath, BNP 284, patient does not have exacerbation congestive heart failure.   Appear euvolemic. Continued Lasix 20 mg twice daily, Coreg 3.125 twice daily, lisinopril 5 mg twice daily home dose. 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  Right atrium moderately dilated. 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 For  myxoma, just needs to be monitored if no symptoms.  And can follow-up as an outpatient with cardiology for further management plans down the road.   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   COPD (chronic obstructive pulmonary disease): Stable   History of stroke with left hemiparesis. Continue aspirin and Plavix CT head was negative for any acute findings, patient could not tolerate MRI.  Isotonic hyponatremia Serum osmolality 285 Na 135 resolved Monitor sodium level daily   Coronary artery disease:  S/p Plavix 300 po once, followed by Plavix 75 mg p.o. daily   Essential hypertension Pt was on Coreg and lisinopril, which has been d/c'd due to postop hypotension Continue to monitor BP and resume medications when needed.  Depression with anxiety -Continue home medications 2/1 started Xanax as needed  Tobacco abuse Smoking cessation counseling done   Body mass index is 21.94 kg/m.  Interventions:       Diet: Heart healthy diet DVT Prophylaxis: Subcutaneous Heparin    Advance goals of  care discussion: Full code  Family Communication: family was not present at bedside, at the time of interview.  The pt provided permission to discuss medical plan with the family. Opportunity was given to ask question and all questions were answered satisfactorily.  2/2 discussed with patient's husband over the phone. 2/3 discussed with patient's husband and her daughter over the phone 2/4 d/w patient husband over the phone, agreed to consult palliative care but wanted her to be full code for now  Disposition:  Pt is from Home, admitted with left foot infection, peripheral vascular disease, s/p LLE bypass sx done on 2/8, clinically stable now, PT and OT recommended SNF placement TOC is following for placement    Subjective: No significant events overnight, patient states she is feeling tired, no pain in the left lower extremity.  Denies any chest pain or palpitation, no shortness of breath.   Physical Exam: General: NAD, resting comfortably Appear in no distress, affect appropriate Eyes: PERRLA ENT: Oral Mucosa Clear, moist  Neck: no JVD,  Cardiovascular: S1 and S2 Present, no Murmur, sinus tachycardia Respiratory: good respiratory effort, Bilateral Air entry equal and Decreased, no Crackles, no wheezes Abdomen: Bowel Sound present, Soft and no tenderness,  Skin: no rashes Extremities: no Pedal edema, no calf tenderness, left foot dressing CDI, LLE postop dressing CDI  Neurologic: Residual weakness on the left side.  Right arm no weakness, right leg is weak. Gait not checked due to patient safety concerns  Vitals:   06/12/22 0700 06/12/22 0730 06/12/22 0800 06/12/22 0900  BP: (!) 112/49  104/61 (!) 154/55  Pulse: 85  93 94  Resp: 19  17 16  $ Temp:  98 F (36.7 C)    TempSrc:  Oral    SpO2: 97%  98% 99%  Weight:      Height:        Intake/Output Summary (Last 24 hours) at 06/12/2022 1456 Last data filed at 06/12/2022 0600 Gross per 24 hour  Intake 378.06 ml  Output 575 ml   Net -196.94 ml   Filed Weights   06/08/22 0442 06/09/22 0455 06/09/22 0721  Weight: 57.4 kg 57.6 kg 57.6 kg    Data Reviewed: I have personally reviewed and interpreted daily labs, tele strips, imagings as discussed above. I reviewed all nursing notes, pharmacy notes, vitals, pertinent old records I have discussed plan of care as described above with RN and patient/family.  CBC: Recent Labs  Lab 06/09/22 0413 06/09/22 1701 06/09/22 2350 06/10/22 1344 06/11/22 0403 06/12/22 0432  WBC 10.8* 22.5* 18.9*  --  12.0* 13.7*  HGB 11.0* 8.8* 8.2* 7.5* 8.4* 9.7*  HCT 34.9* 29.0* 27.1* 23.8* 26.8* 31.1*  MCV 87.7 90.3 90.3  --  85.1 86.4  PLT 328 384 352  --  269 [id number]   Basic Metabolic Panel: Recent Labs  Lab 06/09/22 0413 06/09/22 2350 06/10/22 0500 06/10/22 1659 06/11/22 0403 06/12/22 0432  NA 135 136  --  131* 135 135  K 4.1 4.4  --  4.3 4.7 4.5  CL 99 109  --  98 102 101  CO2 25 21*  --  24 25 27  $ GLUCOSE 94 182*  --  106* 107* 98  BUN  28* 32*  --  28* 20 17  CREATININE 0.75 0.93  --  0.70 0.70 0.66  CALCIUM 9.4 8.0*  --  8.4* 8.6* 9.0  MG 2.0  --  2.1  --  1.8  --   PHOS 4.3  --   --   --  1.9*  --     Studies: No results found.  Scheduled Meds:  aspirin EC  81 mg Oral Daily   busPIRone  7.5 mg Oral BID   Chlorhexidine Gluconate Cloth  6 each Topical Daily   clopidogrel  75 mg Oral Q breakfast   docusate sodium  100 mg Oral Daily   doxycycline  100 mg Oral Q12H   enoxaparin (LOVENOX) injection  40 mg Subcutaneous Q24H   famotidine  20 mg Oral BID   feeding supplement  237 mL Oral TID BM   furosemide  20 mg Oral Daily   multivitamin with minerals  1 tablet Oral Daily   sertraline  100 mg Oral Daily   Continuous Infusions:  sodium chloride     magnesium sulfate bolus IVPB     PRN Meds: sodium chloride, acetaminophen **OR** acetaminophen, alum & mag hydroxide-simeth, dextromethorphan-guaiFENesin, diclofenac Sodium, guaiFENesin-dextromethorphan, hydrALAZINE,  hydrALAZINE, HYDROmorphone (DILAUDID) injection, hydrOXYzine, labetalol, magnesium sulfate bolus IVPB, melatonin, metoprolol tartrate, morphine injection, ondansetron (ZOFRAN) IV, ondansetron, mouth rinse, oxyCODONE-acetaminophen, phenol, potassium chloride, senna-docusate, senna-docusate, sorbitol, tetrahydrozoline  Time spent: 35 minutes  Author: Val Riles. MD [facility] 06/12/2022 2:56 PM  To reach On-call, see care teams to locate the attending and reach out to them via [email or web address]. If 7PM-7AM, please contact night-coverage If you still have difficulty reaching the attending provider, please page the [facility] (Director on Call) for [facility] on amion for assistance.

## 2022-06-12 NOTE — Progress Notes (Signed)
Transferred patient to rm 143 vital signs stable in no acute distress. Transferred via bed with monitor

## 2022-06-13 DIAGNOSIS — L089 Local infection of the skin and subcutaneous tissue, unspecified: Secondary | ICD-10-CM | POA: Diagnosis not present

## 2022-06-13 LAB — BASIC METABOLIC PANEL
Anion gap: 5 (ref 5–15)
BUN: 18 mg/dL (ref 8–23)
CO2: 29 mmol/L (ref 22–32)
Calcium: 9.1 mg/dL (ref 8.9–10.3)
Chloride: 101 mmol/L (ref 98–111)
Creatinine, Ser: 0.68 mg/dL (ref 0.44–1.00)
GFR, Estimated: >60 mL/min (ref >60)
Glucose, Bld: 94 mg/dL (ref 70–99)
Potassium: 4.2 mmol/L (ref 3.5–5.1)
Sodium: 135 mmol/L (ref 135–145)

## 2022-06-13 LAB — MAGNESIUM: Magnesium: 1.8 mg/dL (ref 1.7–2.4)

## 2022-06-13 LAB — SURGICAL PATHOLOGY

## 2022-06-13 LAB — CBC
HCT: 28.4 % — ABNORMAL LOW (ref 36.0–46.0)
Hemoglobin: 8.9 g/dL — ABNORMAL LOW (ref 12.0–15.0)
MCH: 27.1 pg (ref 26.0–34.0)
MCHC: 31.3 g/dL (ref 30.0–36.0)
MCV: 86.6 fL (ref 80.0–100.0)
Platelets: 289 10*3/uL (ref 150–400)
RBC: 3.28 MIL/uL — ABNORMAL LOW (ref 3.87–5.11)
RDW: 16.4 % — ABNORMAL HIGH (ref 11.5–15.5)
WBC: 9.4 10*3/uL (ref 4.0–10.5)
nRBC: 0 % (ref 0.0–0.2)

## 2022-06-13 LAB — PHOSPHORUS: Phosphorus: 3.7 mg/dL (ref 2.5–4.6)

## 2022-06-13 MED ORDER — DOXYCYCLINE HYCLATE 100 MG PO TABS: 100.0000 mg | ORAL_TABLET | Freq: Two times a day (BID) | ORAL | 0 refills | Status: AC

## 2022-06-13 MED ORDER — CARVEDILOL 6.25 MG PO TABS: 3.1250 mg | ORAL_TABLET | Freq: Two times a day (BID) | ORAL | 2 refills | Status: DC

## 2022-06-13 MED ORDER — FAMOTIDINE 20 MG PO TABS: 20.0000 mg | ORAL_TABLET | Freq: Two times a day (BID) | ORAL | 0 refills | Status: DC | PRN

## 2022-06-13 MED ORDER — ASPIRIN 81 MG PO TBEC: 81.0000 mg | DELAYED_RELEASE_TABLET | Freq: Every day | ORAL | 12 refills | Status: AC

## 2022-06-13 MED ORDER — OXYCODONE HCL 5 MG PO TABS: 5.0000 mg | ORAL_TABLET | Freq: Four times a day (QID) | ORAL | 0 refills | Status: DC | PRN

## 2022-06-13 NOTE — Progress Notes (Signed)
[facility] Progress Note  [name]    [id number]  DOA: 05/26/2022     Date of Service: the patient was seen and examined on 06/13/2022  Chief Complaint  Patient presents with   Wound Infection   Brief hospital course: Michele Matthews is a 77 y.o. 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  She was found to have a left heel wound with infection.  Podiatry consult obtained, patient started antibiotics with Rocephin and vancomycin. PAD, seen by vascular surgery, scheduled surgery today.     Assessment and Plan: Left foot infection:  Peripheral arterial disease. S/p left foot TMA done on 11/30/2020 No evidence of sepsis.  Patient has been seen by podiatry, s/p vancomycin, Flagyl and Rocephin.  Transition to doxycycline for 10 days on 06/04/22 Patient was not able to tolerate MRI even give her dose of Ativan.  s/p high dose of oxycodone prior to the ABI testing as patient has unable to tolerate. Discussed with Dr. Posey Pronto, patient does not need debridement.  Recommend antibiotic treatment and follow-up with wound care. Vascular surgery consulted, s/p LLE angiogram,  Diffuse atherosclerotic changes of the left lower extremity, occlusion of the SFA stents as well as the SFA and the popliteal in its entirety.  There appears to be anterior tibial runoff however the this does not appear to cross the ankle. Continue ASA and Plavix, Wound care RN consulted 2/4 patient and family decided for LLE bypass. Informed to vascular surgery. 2/8 s/p LLE bypass surgery, developed postop hypotension, patient was started on phenylephrine and dopamine and nitroglycerin infusions, and transferred to ICU 2/11 currently off pressor support, BP stable   Acute blood loss anemia, post-op  Hb  11.0--8.8--8.2--7.5, on 2/9 Hb 7.5, 1 unit PRBC transfusion. 2/12 Hb 8.9  Continue to monitor and transfuse prn   Chronic combined systolic and diastolic heart failure ([place]): 2D echo on 07/24/2017 showed EF 25 to 30% with grade 2 diastolic dysfunction.  Patient does not have any short of breath, BNP 284, patient does not have exacerbation congestive heart failure.   Appear euvolemic. Continued Lasix 20 mg twice daily, Coreg 3.125 twice daily, lisinopril 5 mg twice daily home dose. 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  Right atrium moderately dilated. 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 For myxoma, just needs to be monitored if no symptoms.  And can follow-up as an outpatient with cardiology for further management plans down the road.   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   COPD (chronic obstructive pulmonary disease): Stable   History of stroke with left hemiparesis. Continue aspirin and Plavix CT head was negative for any acute findings, patient could not tolerate MRI.  Isotonic hyponatremia Serum osmolality 285 Na 135 resolved Monitor sodium level daily   Coronary artery disease:  S/p Plavix 300 po once, followed by Plavix 75 mg p.o. daily   Essential hypertension Pt was on Coreg and lisinopril, which has been d/c'd due to postop hypotension Continue to monitor BP and resume medications when needed.  Depression with anxiety -Continue home medications 2/1 started Xanax as needed  Tobacco abuse Smoking cessation counseling done   Body mass index is 21.94 kg/m.  Interventions:       Diet: Heart healthy diet DVT Prophylaxis: Subcutaneous Heparin    Advance goals of care  discussion: Full code  Family Communication: family was not present at bedside, at the time of interview.  The pt provided permission to discuss medical plan with the family. Opportunity was given to ask question and all questions were answered satisfactorily.  2/2 discussed with patient's husband over the phone. 2/3 discussed with patient's husband and her daughter over the phone 2/4 d/w patient husband over the phone, agreed to consult palliative care but wanted her to be full code for now  Disposition:  Pt is from Home, admitted with left foot infection, peripheral vascular disease, s/p LLE bypass sx done on 2/8, clinically stable now, PT and OT recommended SNF placement but patient and family are refusing, so plan is to discharge her home with home health set up which will be done by case manager. TOC is following, most likely discharge tomorrow a.m.    Subjective: No significant events overnight, patient stated pain is under control, no any other active issues, she still has wound VAC and having difficulty walking but does not want to go to rehab wants to go to home. Denies any chest pain or palpitation, no shortness of breath.   Physical Exam: General: NAD, resting comfortably Appear in no distress, affect appropriate Eyes: PERRLA ENT: Oral Mucosa Clear, moist  Neck: no JVD,  Cardiovascular: S1 and S2 Present, no Murmur, sinus tachycardia Respiratory: good respiratory effort, Bilateral Air entry equal and Decreased, no Crackles, no wheezes Abdomen: Bowel Sound present, Soft and no tenderness,  Skin: no rashes Extremities: no Pedal edema, no calf tenderness, left foot and LLE dressing CDI. Left groin wound VAC attached Neurologic: Residual weakness on the left side.  Right arm no weakness, right leg is weak. Gait not checked due to patient safety concerns  Vitals:   06/13/22 0825 06/13/22 0846 06/13/22 1024 06/13/22 1053  BP: 135/60     Pulse: (!) 105 (!) 104    Resp: 20  20  18  $ Temp: 98.2 F (36.8 C)     TempSrc: Oral     SpO2: 96%     Weight:      Height:        Intake/Output Summary (Last 24 hours) at 06/13/2022 1231 Last data filed at 06/12/2022 1900 Gross per 24 hour  Intake --  Output 350 ml  Net -350 ml   Filed Weights   06/08/22 0442 06/09/22 0455 06/09/22 0721  Weight: 57.4 kg 57.6 kg 57.6 kg    Data Reviewed: I have personally reviewed and interpreted daily labs, tele strips, imagings as discussed above. I reviewed all nursing notes, pharmacy notes, vitals, pertinent old records I have discussed plan of care as described above with RN and patient/family.  CBC: Recent Labs  Lab 06/09/22 1701 06/09/22 2350 06/10/22 1344 06/11/22 0403 06/12/22 0432 06/13/22 0455  WBC 22.5* 18.9*  --  12.0* 13.7* 9.4  HGB 8.8* 8.2* 7.5* 8.4* 9.7* 8.9*  HCT 29.0* 27.1* 23.8* 26.8* 31.1* 28.4*  MCV 90.3 90.3  --  85.1 86.4 86.6  PLT 384 352  --  269 312 [id number]   Basic Metabolic Panel: Recent Labs  Lab 06/09/22 0413 06/09/22 2350 06/10/22 0500 06/10/22 1659 06/11/22 0403 06/12/22 0432 06/13/22 0455  NA  135 136  --  131* 135 135 135  K 4.1 4.4  --  4.3 4.7 4.5 4.2  CL 99 109  --  98 102 101 101  CO2 25 21*  --  24 25 27 29  $ GLUCOSE 94 182*  --  106* 107* 98 94  BUN 28* 32*  --  28* 20 17 18  $ CREATININE 0.75 0.93  --  0.70 0.70 0.66 0.68  CALCIUM 9.4 8.0*  --  8.4* 8.6* 9.0 9.1  MG 2.0  --  2.1  --  1.8  --  1.8  PHOS 4.3  --   --   --  1.9*  --  3.7    Studies: No results found.  Scheduled Meds:  aspirin EC  81 mg Oral Daily   busPIRone  7.5 mg Oral BID   Chlorhexidine Gluconate Cloth  6 each Topical Daily   clopidogrel  75 mg Oral Q breakfast   docusate sodium  100 mg Oral Daily   doxycycline  100 mg Oral Q12H   enoxaparin (LOVENOX) injection  40 mg Subcutaneous Q24H   famotidine  20 mg Oral BID   feeding supplement  237 mL Oral TID BM   furosemide  20 mg Oral Daily   multivitamin with minerals  1 tablet Oral Daily   sertraline   100 mg Oral Daily   Continuous Infusions:  sodium chloride     magnesium sulfate bolus IVPB     PRN Meds: sodium chloride, acetaminophen **OR** acetaminophen, alum & mag hydroxide-simeth, dextromethorphan-guaiFENesin, diclofenac Sodium, guaiFENesin-dextromethorphan, hydrALAZINE, hydrALAZINE, HYDROmorphone (DILAUDID) injection, hydrOXYzine, labetalol, magnesium sulfate bolus IVPB, melatonin, metoprolol tartrate, morphine injection, ondansetron (ZOFRAN) IV, ondansetron, mouth rinse, oxyCODONE-acetaminophen, phenol, potassium chloride, senna-docusate, senna-docusate, sorbitol, tetrahydrozoline  Time spent: 35 minutes  Author: Val Riles. MD [facility] 06/13/2022 12:31 PM  To reach On-call, see care teams to locate the attending and reach out to them via [email or web address]. If 7PM-7AM, please contact night-coverage If you still have difficulty reaching the attending provider, please page the [facility] (Director on Call) for [facility] on amion for assistance.

## 2022-06-13 NOTE — Progress Notes (Signed)
Progress Note    06/13/2022 10:19 AM 4 Days Post-Op  Subjective:  Michele Matthews is a 77 yo female now POD #4 from a Left femoral to below-knee popliteal bypass with Left profunda femoris artery endarterectomy and left tibioperoneal trunk and peroneal artery endarterectomy and vein patch angioplasty.  Operatively she developed some hypotension and was started on phenylephrine and dopamine and nitroglycerin infusions but these have been discontinued.    On exam today patient is resting comfortably in bed in this morning. Patient states that her left leg feels better this morning.  She states that she does not have the pain she had prior to surgery.  No other complaints overnight.  Vitals all remained stable.   Vitals:   06/13/22 0825 06/13/22 0846  BP: 135/60   Pulse: (!) 105 (!) 104  Resp: 20   Temp: 98.2 F (36.8 C)   SpO2: 96%    Physical Exam: Cardiac:  RRR No murmur S1 and S2 present   Lungs:  good respiratory effort, Bilateral Air entry equal and Decreased, no Crackles, no wheezes   Incisions:  left foot dressing CDI and changed this morning, LLE postop incision dressing is clean dry and intact. No hematoma or seroma to note.    Extremities:  no Pedal edema, no calf tenderness, LLE warm to touch with strong doppler pulses.   Abdomen:  Bowel Sound present, Soft and NT/ND Neurologic: AAOX3 answers all question appropriately. Neuro intact  CBC    Component Value Date/Time   WBC 9.4 06/13/2022 0455   RBC 3.28 (L) 06/13/2022 0455   HGB 8.9 (L) 06/13/2022 0455   HGB 12.1 03/27/2014 1730   HCT 28.4 (L) 06/13/2022 0455   HCT 37.0 03/27/2014 1730   PLT 289 06/13/2022 0455   PLT 175 03/27/2014 1730   MCV 86.6 06/13/2022 0455   MCV 97 03/27/2014 1730   MCH 27.1 06/13/2022 0455   MCHC 31.3 06/13/2022 0455   RDW 16.4 (H) 06/13/2022 0455   RDW 13.3 03/27/2014 1730   LYMPHSABS 1.2 05/26/2022 1354   LYMPHSABS 1.9 11/25/2013 0401   MONOABS 0.6 05/26/2022 1354   MONOABS 0.7  11/25/2013 0401   EOSABS 0.2 05/26/2022 1354   EOSABS 0.2 11/25/2013 0401   BASOSABS 0.0 05/26/2022 1354   BASOSABS 0.0 11/25/2013 0401    BMET    Component Value Date/Time   NA 135 06/13/2022 0455   NA 140 03/28/2014 0441   K 4.2 06/13/2022 0455   K 4.0 03/28/2014 0441   CL 101 06/13/2022 0455   CL 110 (H) 03/28/2014 0441   CO2 29 06/13/2022 0455   CO2 24 03/28/2014 0441   GLUCOSE 94 06/13/2022 0455   GLUCOSE 86 03/28/2014 0441   BUN 18 06/13/2022 0455   BUN 19 (H) 03/28/2014 0441   CREATININE 0.68 06/13/2022 0455   CREATININE 0.79 11/24/2015 0908   CALCIUM 9.1 06/13/2022 0455   CALCIUM 8.7 03/28/2014 0441   GFRNONAA >60 06/13/2022 0455   GFRNONAA 76 11/24/2015 0908   GFRAA >60 07/28/2017 0507   GFRAA 87 11/24/2015 0908    INR    Component Value Date/Time   INR 1.0 05/26/2022 1354   INR 2.0 03/30/2014 0336     Intake/Output Summary (Last 24 hours) at 06/13/2022 1019 Last data filed at 06/12/2022 1900 Gross per 24 hour  Intake --  Output 350 ml  Net -350 ml     Assessment/Plan:  77 y.o. female is s/p Left femoral to below-knee popliteal bypass with Left  profunda femoris artery endarterectomy and left tibioperoneal trunk and peroneal artery endarterectomy and vein patch angioplasty.   4 Days Post-Op    PLAN: Patient recovering as expected. Started on ASA 81 mg po and Plavix 75 mg po QD today.  Monitor Left Groin Prevena Vac for bleeding.  Provide post op pain control.  Discharge to Home with Husband  DVT prophylaxis:  Lovenox, asa and plavix.    Drema Pry [facility] 06/13/2022 10:19 AM

## 2022-06-13 NOTE — Care Management Important Message (Signed)
Important Message  Patient Details  Name: Michele Matthews MRN: [id number] Date of Birth: 1946/02/10   Medicare Important Message Given:  Yes     Dannette Barbara 06/13/2022, 12:40 PM

## 2022-06-13 NOTE — Plan of Care (Signed)
  Problem: Skin Integrity: Goal: Risk for impaired skin integrity will decrease Outcome: Progressing   Problem: Activity: Goal: Ability to return to baseline activity level will improve Outcome: Progressing   Problem: Activity: Goal: Ability to tolerate increased activity will improve Outcome: Progressing   Problem: Clinical Measurements: Goal: Postoperative complications will be avoided or minimized Outcome: Progressing Goal: Signs and symptoms of graft occlusion will improve Outcome: Progressing   Problem: Skin Integrity: Goal: Demonstration of wound healing without infection will improve Outcome: Progressing

## 2022-06-13 NOTE — TOC Progression Note (Addendum)
Transition of Care [facility]) - Progression Note    Patient Details  Name: Michele Matthews MRN: [id number] Date of Birth: [birth year]  [facility] CM/SW [place], RN Phone Number: 06/13/2022, 1:39 PM  Clinical Narrative:     Jeralene Huff out to the husband Darrell, left a voice mail asking for a call back  She has Rolling Walker (2 wheels);Rollator (4 wheels);Tub bench;Wheelchair - manual;Cane - single point  at home [place] is lined up per previous notes for [facility] He called back and stated that she has all of the DME needed at home,  They do have [facility] for [facility]  Expected Discharge Plan: [place] Barriers to Discharge: No Barriers Identified  Expected Discharge Plan and Services   Discharge Planning Services: CM Consult   Living arrangements for the past 2 months: Single Family Home                           HH Arranged: PT HH Agency: [facility] Date [place]: 06/13/22 Time [place]: 1338 Representative spoke with at [place]: [place]   Social Determinants of Health ([place]) Interventions SDOH Screenings   Food Insecurity: No Food Insecurity (05/26/2022)  Housing: Low Risk  (05/26/2022)  Transportation Needs: No Transportation Needs (05/26/2022)  Utilities: Not At Risk (05/26/2022)  Financial Resource Strain: Medium Risk (11/08/2021)  Tobacco Use: High Risk (06/10/2022)    Readmission Risk Interventions     No data to display

## 2022-06-14 DIAGNOSIS — L089 Local infection of the skin and subcutaneous tissue, unspecified: Secondary | ICD-10-CM | POA: Diagnosis not present

## 2022-06-14 LAB — BASIC METABOLIC PANEL
Anion gap: 11 (ref 5–15)
BUN: 26 mg/dL — ABNORMAL HIGH (ref 8–23)
CO2: 25 mmol/L (ref 22–32)
Calcium: 8.9 mg/dL (ref 8.9–10.3)
Chloride: 101 mmol/L (ref 98–111)
Creatinine, Ser: 0.82 mg/dL (ref 0.44–1.00)
GFR, Estimated: >60 mL/min (ref >60)
Glucose, Bld: 94 mg/dL (ref 70–99)
Potassium: 4 mmol/L (ref 3.5–5.1)
Sodium: 137 mmol/L (ref 135–145)

## 2022-06-14 LAB — CBC
HCT: 29.7 % — ABNORMAL LOW (ref 36.0–46.0)
Hemoglobin: 9.2 g/dL — ABNORMAL LOW (ref 12.0–15.0)
MCH: 26.7 pg (ref 26.0–34.0)
MCHC: 31 g/dL (ref 30.0–36.0)
MCV: 86.3 fL (ref 80.0–100.0)
Platelets: 323 10*3/uL (ref 150–400)
RBC: 3.44 MIL/uL — ABNORMAL LOW (ref 3.87–5.11)
RDW: 16.5 % — ABNORMAL HIGH (ref 11.5–15.5)
WBC: 7.8 10*3/uL (ref 4.0–10.5)
nRBC: 0 % (ref 0.0–0.2)

## 2022-06-14 NOTE — Discharge Summary (Signed)
[facility] Discharge Summary   [name] [id number]  PCP: Dion Body, MD  Date of admission: 05/26/2022   Date of discharge: 06/14/2022      Discharge Diagnoses:  Principal Problem:   Left foot infection Active Problems:   Chronic combined systolic and diastolic heart failure (HCC)   COPD (chronic obstructive pulmonary disease) (HCC)   Coronary artery disease   Essential hypertension   PAD (peripheral artery disease) (HCC)   HLD (hyperlipidemia)   Depression with anxiety   Leg edema, right   Tobacco abuse   Hemiparesis affecting left side as late effect of cerebrovascular accident (CVA) ([place])   Admitted From: Home Disposition:  Home with [facility] services   Recommendations for Outpatient Follow-up:  Follow-up with PCP in 1 week Follow-up with vascular surgery in 1 week Follow-up with podiatry in 1 week Follow-up with cardiology in 1 to 2 weeks Follow up LABS/TEST:     Diet recommendation: Cardiac diet  Activity: The patient is advised to gradually reintroduce usual activities, as tolerated  Discharge Condition: stable  Code Status: Full code   History of present illness: As per the H and P dictated on admission Hospital Course:  Michele Matthews is a 77 y.o. 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  She was found to have a left heel wound with infection.  Podiatry consult obtained, patient started antibiotics with Rocephin and vancomycin.  Vascular surgery was consulted.  Further management as below.   Assessment and Plan: # Peripheral arterial disease. Vascular surgery consulted, s/p LLE angiogram,  Diffuse atherosclerotic changes of the left lower extremity, occlusion of the SFA stents as well as the SFA and the popliteal in its  entirety.  There appears to be anterior tibial runoff however the this does not appear to cross the ankle. Continue ASA and Plavix, Wound care RN consulted. On 2/4 patient and family decided for LLE bypass. Informed to vascular surgery. On 2/8 s/p LLE bypass surgery, developed postop hypotension, patient was started on phenylephrine and dopamine and nitroglycerin infusions, and transferred to ICU.  Patient was stabilized and transferred to floor bed.  Vital signs currently stable.  Resumed home medications on discharge.  Patient was cleared by vascular surgery to follow-up as an outpatient and continue wound VAC.  Home care set up will be done by social Development worker, community.  # Left foot infection, S/p left foot TMA done on 11/30/2020. No evidence of sepsis.  Patient has been seen by podiatry, s/p vancomycin, Flagyl and Rocephin.  Transition to doxycycline for 10 days on 06/04/22. Patient was not able to tolerate MRI even give her dose of Ativan.  s/p high dose of oxycodone prior to the ABI testing as patient has unable to tolerate. Discussed with Dr. Posey Pronto, patient does not need debridement.  Recommend antibiotic treatment and follow-up with wound care. # Acute blood loss anemia, post-op: Hb 11.0--8.8--8.2--7.5, on 2/9 Hb 7.5, 1 unit PRBC transfusion. On 2/13 Hb 9.2, stable. # Chronic combined systolic and diastolic heart failure: 2D echo on 07/24/2017 showed EF 25 to 30% with grade 2 diastolic dysfunction. Patient does not have any short of breath, BNP 284, patient does not have exacerbation congestive heart failure. Appear euvolemic. Continued Lasix 20 mg twice daily, Coreg 3.125 twice daily, lisinopril 5 mg twice daily home  dose. 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  Right atrium moderately dilated. 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 For myxoma, just needs to be monitored if no symptoms.  And can follow-up as an outpatient with cardiology for further management plans down the road. 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 # COPD (chronic obstructive pulmonary disease): Stable # History of stroke with left hemiparesis: Continue aspirin and Plavix CT head was negative for any acute findings, patient could not tolerate MRI. # Isotonic hyponatremia, Serum osmolality 285, Na 137 resolved # Coronary artery disease: S/p Plavix 300 po once, followed by Plavix 75 mg p.o. daily, continue aspirin as well as per vascular surgery.  Patient remained chest pain-free. # Essential hypertension: BP improved, resumed Coreg, discontinued lisinopril.  Patient was advised to monitor BP at home and follow with PCP as an outpatient. # Depression with anxiety, Continue home medications.  Xanax as needed was ordered during hospital stay for anxiety. # Tobacco abuse, Smoking cessation counseling done  Body mass index is 21.13 kg/m.  Nutrition Interventions:   Pressure Injury 06/10/22 Sacrum Mid Stage 1 -  Intact skin with non-blanchable redness of a localized area usually over a bony prominence. red (Active)  06/10/22 0800  Location: Sacrum  Location Orientation: Mid  Staging: Stage 1 -  Intact skin with non-blanchable redness of a localized area usually over a bony prominence.  Wound Description (Comments): red  Present on Admission:   Dressing Type Foam - Lift dressing to assess site every shift 06/13/22 2048     Pain control  - [place] could not review, website was not working.  Patient was prescribed oxycodone 5 mg p.o. every 6 hourly as needed for few days.  - Patient was instructed, not to drive, operate heavy machinery, perform activities at heights, swimming or  participation in water activities or provide baby sitting services while on Pain, Sleep and Anxiety Medications; until her outpatient Physician has advised to do so again.  - Also recommended to not to take more than prescribed Pain, Sleep and Anxiety Medications.  Patient was seen by physical therapy, who recommended SNF placement but patient refused so home health set up will be arranged by case manager. On the day of the discharge the patient's vitals were stable, and no other acute medical condition were reported by patient. the patient was felt safe to be discharge at Home with Home health.  Consultants: Podiatry, vascular surgery, cardiology Procedures: s/p LLE angiogram and bypass surgery  Discharge Exam: General: Appear in no distress, no Rash; Oral Mucosa Clear, moist. Cardiovascular: S1 and S2 Present, no Murmur, Respiratory: normal respiratory effort, Bilateral Air entry present and no Crackles, no wheezes Abdomen: Bowel Sound present, Soft and no tenderness, no hernia Extremities: no Pedal edema, no calf tenderness, Left foot and leg dressing CDI Neurology: alert and oriented to time, place, and person affect appropriate.  Filed Weights   06/08/22 0442 06/09/22 0455 06/09/22 0721  Weight: 57.4 kg 57.6 kg 57.6 kg   Vitals:   06/13/22 2331 06/14/22 0800  BP: (!) 103/59 110/72  Pulse: 90 94  Resp: 16 16  Temp: 97.8 F (36.6 C) (!) 97.5 F (36.4  C)  SpO2: 98% 93%    DISCHARGE MEDICATION: Allergies as of 06/14/2022   No Known Allergies      Medication List     STOP taking these medications    lisinopril 5 MG tablet Commonly known as: ZESTRIL   nicotine 21 mg/24hr patch Commonly known as: NICODERM CQ - dosed in mg/24 hours   pantoprazole 40 MG tablet Commonly known as: PROTONIX       TAKE these medications    acetaminophen 500 MG tablet Commonly known as: TYLENOL Take 2 tablets (1,000 mg total) by mouth 3 (three) times daily.   ascorbic acid 250 MG  tablet Commonly known as: VITAMIN C Take 1 tablet (250 mg total) by mouth 2 (two) times daily.   aspirin EC 81 MG tablet Take 1 tablet (81 mg total) by mouth daily. Swallow whole.   atorvastatin 20 MG tablet Commonly known as: LIPITOR Take 20 mg by mouth daily.   busPIRone 7.5 MG tablet Commonly known as: BUSPAR Take 1 tablet (7.5 mg total) by mouth 2 (two) times daily.   carvedilol 6.25 MG tablet Commonly known as: COREG Take 0.5 tablets (3.125 mg total) by mouth 2 (two) times daily with a meal. What changed: medication strength   clopidogrel 75 MG tablet Commonly known as: PLAVIX Take 75 mg by mouth daily.   diphenhydramine-acetaminophen 25-500 MG Tabs tablet Commonly known as: TYLENOL PM Take 1 tablet by mouth at bedtime as needed.   doxycycline 100 MG tablet Commonly known as: VIBRA-TABS Take 1 tablet (100 mg total) by mouth every 12 (twelve) hours for 4 days.   famotidine 20 MG tablet Commonly known as: PEPCID Take 1 tablet (20 mg total) by mouth 2 (two) times daily as needed for heartburn or indigestion.   feeding supplement Liqd Take 237 mLs by mouth 3 (three) times daily between meals.   ferrous sulfate 325 (65 FE) MG tablet Take 1 tablet (325 mg total) by mouth 2 (two) times daily with a meal.   furosemide 20 MG tablet Commonly known as: Lasix Take 1 tablet (20 mg total) by mouth daily.   hydrOXYzine 10 MG tablet Commonly known as: ATARAX Take 1 tablet (10 mg total) by mouth 2 (two) times daily as needed for anxiety (sleep).   multivitamin tablet Take 1 tablet by mouth daily.   oxyCODONE 5 MG immediate release tablet Commonly known as: Oxy IR/ROXICODONE Take 1 tablet (5 mg total) by mouth every 6 (six) hours as needed for severe pain. What changed:  when to take this reasons to take this   senna-docusate 8.6-50 MG tablet Commonly known as: Senokot-S Take 2 tablets by mouth 2 (two) times daily.   sertraline 100 MG tablet Commonly known as:  ZOLOFT Take 1 tablet (100 mg total) by mouth 2 (two) times daily.   VISINE OP Place 1 drop into both eyes 2 (two) times daily as needed (dry eyes).               Discharge Care Instructions  (From admission, onward)           Start     Ordered   06/14/22 0000  Discharge wound care:       Comments: As mentioned above   06/14/22 1041           No Known Allergies Discharge Instructions     Call MD for:  difficulty breathing, headache or visual disturbances   Complete by: As directed    Call MD for:  extreme fatigue   Complete by: As directed    Call MD for:  persistant dizziness or light-headedness   Complete by: As directed    Call MD for:  persistant nausea and vomiting   Complete by: As directed    Call MD for:  severe uncontrolled pain   Complete by: As directed    Call MD for:  temperature >100.4   Complete by: As directed    Diet - low sodium heart healthy   Complete by: As directed    Discharge instructions   Complete by: As directed    Follow-up with PCP in 1 week Follow-up with vascular surgery in 1 week Follow-up with podiatry in 1 week   Discharge wound care:   Complete by: As directed    As mentioned above   Increase activity slowly   Complete by: As directed        The results of significant diagnostics from this hospitalization (including imaging, microbiology, ancillary and laboratory) are listed below for reference.    Significant Diagnostic Studies: CT HEAD WO CONTRAST (5MM)  Result Date: 06/01/2022 CLINICAL DATA:  New right-sided weakness EXAM: CT HEAD WITHOUT CONTRAST TECHNIQUE: Contiguous axial images were obtained from the base of the skull through the vertex without intravenous contrast. RADIATION DOSE REDUCTION: This exam was performed according to the departmental dose-optimization program which includes automated exposure control, adjustment of the mA and/or kV according to patient size and/or use of iterative reconstruction  technique. COMPARISON:  CT head 09/05/2021, MR head 09/06/2021 FINDINGS: Brain: There is hypodensity in the left aspect of the pons and midbrain which may be artifactual; however, acute infarct can not be excluded. There is no evidence of acute intracranial hemorrhage or extra-axial fluid collection. Multiple remote infarcts in the right frontal and parietal lobes in the MCA distribution and left occipital lobe in the PCA distribution are unchanged. Parenchymal volume is stable. The ventricles are stable in size, with unchanged ex vacuo dilatation of the lateral ventricles related to the infarcts. The pituitary and suprasellar region are normal. There is no mass lesion. There is no mass effect or midline shift. Vascular: There is calcification of the bilateral carotid siphons and vertebral arteries. Skull: Normal. Negative for fracture or focal lesion. Sinuses/Orbits: The imaged paranasal sinuses are clear. The globes and orbits are unremarkable. Other: None. IMPRESSION: Hypodensity in the left aspect of the pons and midbrain may reflect artifact; however, acute infarct is not excluded. Recommend brain MRI for further evaluation. These results will be called to the ordering clinician or representative by the Radiologist Assistant, and communication documented in the PACS or [employer]. Electronically Signed   By: Valetta Mole M.D.   On: 06/01/2022 13:00   ECHOCARDIOGRAM COMPLETE  Result Date: 06/01/2022    ECHOCARDIOGRAM REPORT   Patient Name:   Michele Matthews Date of Exam: 06/01/2022 Medical Rec #:  [id number]     Height:       65.0 in Accession #:    [id number]    Weight:       131.8 lb Date of Birth:  1945-08-14     BSA:          1.657 m Patient Age:    36 years      BP:           114/62 mmHg Patient Gender: F             HR:           109  bpm. Exam Location:  [facility] Procedure: 2D Echo, Color Doppler and Cardiac Doppler Indications:     Cardiomyopathy-unspecified I42.9  History:         Patient has prior  history of Echocardiogram examinations, most                  recent 07/25/2017. COPD and Stroke; Risk Factors:Hypertension.  Sonographer:     Sherrie Sport Referring Phys:  [id number] AMBER SCOGGINS Diagnosing Phys: Neoma Laming  Sonographer Comments: Suboptimal parasternal window. IMPRESSIONS  1. Left ventricular ejection fraction, by estimation, is 40 to 45%. The left ventricle has mildly decreased function. The left ventricle demonstrates global hypokinesis. There is severe concentric left ventricular hypertrophy. Left ventricular diastolic  parameters are consistent with Grade I diastolic dysfunction (impaired relaxation).  2. Right ventricular systolic function is normal. The right ventricular size is normal.  3. 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 Left atrial size was severely dilated.  4. Right atrial size was moderately dilated.  5. The mitral valve is normal in structure. Mild mitral valve regurgitation. No evidence of mitral stenosis.  6. The aortic valve is normal in structure. Aortic valve regurgitation is not visualized. Aortic valve sclerosis/calcification is present, without any evidence of aortic stenosis.  7. The inferior vena cava is normal in size with greater than 50% respiratory variability, suggesting right atrial pressure of 3 mmHg. FINDINGS  Left Ventricle: Left ventricular ejection fraction, by estimation, is 40 to 45%. The left ventricle has mildly decreased function. The left ventricle demonstrates global hypokinesis. The left ventricular internal cavity size was normal in size. There is  severe concentric left ventricular hypertrophy. Left ventricular diastolic parameters are consistent with Grade I diastolic dysfunction (impaired relaxation). Right Ventricle: The right ventricular size is normal. No increase in right ventricular wall thickness. Right ventricular systolic function is normal. Left Atrium: Large mass in left atrium 4.67x3.2 cm probably Myxoma VS  thrombus. Left atrial size was severely dilated. Right Atrium: Right atrial size was moderately dilated. Pericardium: There is no evidence of pericardial effusion. Mitral Valve: The mitral valve is normal in structure. Mild mitral valve regurgitation. No evidence of mitral valve stenosis. Tricuspid Valve: The tricuspid valve is normal in structure. Tricuspid valve regurgitation is mild . No evidence of tricuspid stenosis. Aortic Valve: The aortic valve is normal in structure. Aortic valve regurgitation is not visualized. Aortic valve sclerosis/calcification is present, without any evidence of aortic stenosis. Aortic valve mean gradient measures 6.0 mmHg. Aortic valve peak  gradient measures 9.9 mmHg. Aortic valve area, by VTI measures 2.43 cm. Pulmonic Valve: The pulmonic valve was normal in structure. Pulmonic valve regurgitation is not visualized. No evidence of pulmonic stenosis. Aorta: The aortic root is normal in size and structure. Venous: The inferior vena cava is normal in size with greater than 50% respiratory variability, suggesting right atrial pressure of 3 mmHg. IAS/Shunts: No atrial level shunt detected by color flow Doppler. 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  LEFT VENTRICLE PLAX 2D LVIDd:         5.00 cm      Diastology LVIDs:         4.50 cm      LV e' medial:    9.68 cm/s LV PW:         1.10 cm      LV E/e' medial:  7.5 LV IVS:        1.20 cm  LV e' lateral:   11.10 cm/s LVOT diam:     2.30 cm      LV E/e' lateral: 6.6 LV SV:         71 LV SV Index:   43 LVOT Area:     4.15 cm  LV Volumes (MOD) LV vol d, MOD A2C: 112.0 ml LV vol d, MOD A4C: 128.0 ml LV vol s, MOD A2C: 70.5 ml LV vol s, MOD A4C: 71.3 ml LV SV MOD A2C:     41.5 ml LV SV MOD A4C:     128.0 ml LV SV MOD BP:      48.4 ml RIGHT VENTRICLE RV Basal diam:  2.80 cm RV Mid diam:    2.40 cm RV S prime:     17.60 cm/s TAPSE (M-mode): 2.4 cm LEFT ATRIUM             Index        RIGHT ATRIUM           Index LA diam:        2.80 cm 1.69 cm/m   RA Area:     9.98 cm LA Vol (A2C):   33.6 ml 20.28 ml/m  RA Volume:   20.00 ml 12.07 ml/m LA Vol (A4C):   75.2 ml 45.38 ml/m LA Biplane Vol: 51.7 ml 31.20 ml/m  AORTIC VALVE AV Area (Vmax):    2.54 cm AV Area (Vmean):   2.49 cm AV Area (VTI):     2.43 cm AV Vmax:           157.00 cm/s AV Vmean:          111.000 cm/s AV VTI:            0.294 m AV Peak Grad:      9.9 mmHg AV Mean Grad:      6.0 mmHg LVOT Vmax:         95.90 cm/s LVOT Vmean:        66.600 cm/s LVOT VTI:          0.172 m LVOT/AV VTI ratio: 0.59  AORTA Ao Root diam: 3.00 cm MITRAL VALVE                TRICUSPID VALVE MV Area (PHT): 4.29 cm     TR Peak grad:   14.1 mmHg MV Decel Time: 177 msec     TR Vmax:        188.00 cm/s MV E velocity: 72.80 cm/s MV A velocity: 112.00 cm/s  SHUNTS MV E/A ratio:  0.65         Systemic VTI:  0.17 m                             Systemic Diam: 2.30 cm Neoma Laming Electronically signed by Neoma Laming Signature Date/Time: 06/01/2022/11:26:09 AM    Final    [place] SAPHENOUS VEIN MAPPING LEFT  Result Date: 06/01/2022 CLINICAL DATA:  77 year old female for bypass, preoperative left lower extremity venous mapping EXAM: LEFT LOWER EXTREMITY VENOUS ULTRASOUND TECHNIQUE: Gray-scale sonography with color Doppler and duplex ultrasound were performed to evaluate the superficial venous systems of the left leg. A venous map accompanies the imaging. COMPARISON:  None Available. FINDINGS: Proximal great saphenous vein ranges in diameter 3.6 mm-6.0 mm. Diameter in the calf estimated 2.2 mm-2.9 mm. Depth range estimated 2.7 mm-10.9 mm. Map accompanies the imaging. IMPRESSION: Left lower extremity superficial venous mapping as  above. Electronically Signed   By: Corrie Mckusick D.O.   On: 06/01/2022 08:26   PERIPHERAL VASCULAR CATHETERIZATION  Result Date: 05/31/2022 See surgical note for result.  US ARTERIAL ABI (SCREENING LOWER EXTREMITY)  Result Date: 05/30/2022 CLINICAL DATA:   Peripheral arterial disease EXAM: NONINVASIVE PHYSIOLOGIC VASCULAR STUDY OF BILATERAL LOWER EXTREMITIES TECHNIQUE: Evaluation of both lower extremities were performed at rest, including calculation of ankle-brachial indices with single level Doppler, pressure and pulse volume recording. COMPARISON:  None Available. FINDINGS: Right ABI:  0.41 Left ABI:  Not calculated Right Lower Extremity: Abnormal blunted monophasic arterial waveforms. < 0.5 Severe PAD IMPRESSION: Severe right lower extremity peripheral arterial disease with a resting ankle-brachial index of 0.41. Electronically Signed   By: Jacqulynn Cadet M.D.   On: 05/30/2022 05:34   US Venous Img Lower Unilateral Right (DVT)  Result Date: 05/26/2022 CLINICAL DATA:  Right leg swelling for the past week. EXAM: RIGHT LOWER EXTREMITY VENOUS DOPPLER ULTRASOUND TECHNIQUE: Gray-scale sonography with compression, as well as color and duplex ultrasound, were performed to evaluate the deep venous system(s) from the level of the common femoral vein through the popliteal and proximal calf veins. COMPARISON:  None Available. FINDINGS: VENOUS Normal compressibility of the common femoral, superficial femoral, and popliteal veins, as well as the visualized calf veins. Visualized portions of profunda femoral vein and great saphenous vein unremarkable. No filling defects to suggest DVT on grayscale or color Doppler imaging. Doppler waveforms show normal direction of venous flow, normal respiratory plasticity and response to augmentation. The contralateral common femoral vein was not imaged. OTHER None. Limitations: None. IMPRESSION: Negative. Electronically Signed   By: Titus Dubin M.D.   On: 05/26/2022 18:42   DG Foot 2 Views Left  Result Date: 05/26/2022 CLINICAL DATA:  Pain and swelling EXAM: LEFT FOOT - 2 VIEW COMPARISON:  12/02/2020. FINDINGS: Patient is status post amputation of the first through fifth proximal metatarsals. No gross bony destructive process  identified. Osseous structures are osteopenic. No traumatic osseous abnormalities. IMPRESSION: No gross abnormalities identified status post first through fifth proximal metatarsal amputations. Electronically Signed   By: Sammie Bench M.D.   On: 05/26/2022 16:18   DG Chest 2 View  Result Date: 05/26/2022 CLINICAL DATA:  Suspected Sepsis EXAM: CHEST - 2 VIEW COMPARISON:  11/29/2020. FINDINGS: The heart size and mediastinal contours are within normal limits. Both lungs are clear. No pneumothorax or pleural effusion. Aorta is calcified. There are thoracic degenerative changes. Retrocardiac opacity noted with a air-fluid level consistent with a moderate hiatal hernia. IMPRESSION: No active cardiopulmonary disease.  Moderate-sized hiatal hernia. Electronically Signed   By: Sammie Bench M.D.   On: 05/26/2022 14:29    Microbiology: Recent Results (from the past 240 hour(s))  Surgical PCR screen     Status: None   Collection Time: 06/09/22  2:12 AM   Specimen: Nasal Mucosa; Nasal Swab  Result Value Ref Range Status   MRSA, PCR NEGATIVE NEGATIVE Final   Staphylococcus aureus NEGATIVE NEGATIVE Final    Comment: (NOTE) The Xpert SA Assay (FDA approved for NASAL specimens in patients 59 years of age and older), is one component of a comprehensive surveillance program. It is not intended to diagnose infection nor to guide or monitor treatment. Performed at [facility], [address] [place], [place] [address]   MRSA Next Gen by PCR, Nasal     Status: None   Collection Time: 06/09/22  4:03 PM   Specimen: Nasal Mucosa; Nasal Swab  Result  Value Ref Range Status   MRSA by PCR Next Gen NOT DETECTED NOT DETECTED Final    Comment: (NOTE) The GeneXpert MRSA Assay (FDA approved for NASAL specimens only), is one component of a comprehensive MRSA colonization surveillance program. It is not intended to diagnose MRSA infection nor to guide or monitor treatment for MRSA infections. Test  performance is not FDA approved in patients less than 48 years old. Performed at [facility], [address] [place], [place] [address]      Labs: CBC: Recent Labs  Lab 06/09/22 2350 06/10/22 1344 06/11/22 0403 06/12/22 0432 06/13/22 0455 06/14/22 0320  WBC 18.9*  --  12.0* 13.7* 9.4 7.8  HGB 8.2* 7.5* 8.4* 9.7* 8.9* 9.2*  HCT 27.1* 23.8* 26.8* 31.1* 28.4* 29.7*  MCV 90.3  --  85.1 86.4 86.6 86.3  PLT 352  --  269 312 289 [id number]   Basic Metabolic Panel: Recent Labs  Lab 06/09/22 0413 06/09/22 2350 06/10/22 0500 06/10/22 1659 06/11/22 0403 06/12/22 0432 06/13/22 0455 06/14/22 0320  NA 135   < >  --  131* 135 135 135 137  K 4.1   < >  --  4.3 4.7 4.5 4.2 4.0  CL 99   < >  --  98 102 101 101 101  CO2 25   < >  --  24 25 27 29 25  $ GLUCOSE 94   < >  --  106* 107* 98 94 94  BUN 28*   < >  --  28* 20 17 18 $ 26*  CREATININE 0.75   < >  --  0.70 0.70 0.66 0.68 0.82  CALCIUM 9.4   < >  --  8.4* 8.6* 9.0 9.1 8.9  MG 2.0  --  2.1  --  1.8  --  1.8  --   PHOS 4.3  --   --   --  1.9*  --  3.7  --    < > = values in this interval not displayed.   Liver Function Tests: No results for input(s): "AST", "ALT", "ALKPHOS", "BILITOT", "PROT", "ALBUMIN" in the last 168 hours. No results for input(s): "LIPASE", "AMYLASE" in the last 168 hours. No results for input(s): "AMMONIA" in the last 168 hours. Cardiac Enzymes: No results for input(s): "CKTOTAL", "CKMB", "CKMBINDEX", "TROPONINI" in the last 168 hours. BNP (last 3 results) Recent Labs    09/05/21 0954 05/26/22 1354  BNP 1,700.3* 284.4*   CBG: Recent Labs  Lab 06/09/22 1541  GLUCAP 125*    Time spent: 35 minutes  Signed:  Val Riles  [facility] 06/14/2022  3:23 PM

## 2022-06-15 DIAGNOSIS — I110 Hypertensive heart disease with heart failure: Secondary | ICD-10-CM | POA: Diagnosis not present

## 2022-06-15 DIAGNOSIS — J439 Emphysema, unspecified: Secondary | ICD-10-CM | POA: Diagnosis not present

## 2022-06-15 DIAGNOSIS — L97521 Non-pressure chronic ulcer of other part of left foot limited to breakdown of skin: Secondary | ICD-10-CM | POA: Diagnosis not present

## 2022-06-15 DIAGNOSIS — D62 Acute posthemorrhagic anemia: Secondary | ICD-10-CM | POA: Diagnosis not present

## 2022-06-15 DIAGNOSIS — T8789 Other complications of amputation stump: Secondary | ICD-10-CM | POA: Diagnosis not present

## 2022-06-15 DIAGNOSIS — I5042 Chronic combined systolic (congestive) and diastolic (congestive) heart failure: Secondary | ICD-10-CM | POA: Diagnosis not present

## 2022-06-15 DIAGNOSIS — Z48812 Encounter for surgical aftercare following surgery on the circulatory system: Secondary | ICD-10-CM | POA: Diagnosis not present

## 2022-06-15 DIAGNOSIS — J449 Chronic obstructive pulmonary disease, unspecified: Secondary | ICD-10-CM | POA: Diagnosis not present

## 2022-06-15 DIAGNOSIS — I70245 Atherosclerosis of native arteries of left leg with ulceration of other part of foot: Secondary | ICD-10-CM | POA: Diagnosis not present

## 2022-06-16 ENCOUNTER — Encounter: Payer: Self-pay | Admitting: Vascular Surgery

## 2022-06-17 DIAGNOSIS — I70245 Atherosclerosis of native arteries of left leg with ulceration of other part of foot: Secondary | ICD-10-CM | POA: Diagnosis not present

## 2022-06-17 DIAGNOSIS — T8789 Other complications of amputation stump: Secondary | ICD-10-CM | POA: Diagnosis not present

## 2022-06-17 DIAGNOSIS — J439 Emphysema, unspecified: Secondary | ICD-10-CM | POA: Diagnosis not present

## 2022-06-17 DIAGNOSIS — J449 Chronic obstructive pulmonary disease, unspecified: Secondary | ICD-10-CM | POA: Diagnosis not present

## 2022-06-17 DIAGNOSIS — Z48812 Encounter for surgical aftercare following surgery on the circulatory system: Secondary | ICD-10-CM | POA: Diagnosis not present

## 2022-06-17 DIAGNOSIS — I5042 Chronic combined systolic (congestive) and diastolic (congestive) heart failure: Secondary | ICD-10-CM | POA: Diagnosis not present

## 2022-06-17 DIAGNOSIS — I110 Hypertensive heart disease with heart failure: Secondary | ICD-10-CM | POA: Diagnosis not present

## 2022-06-17 DIAGNOSIS — L97521 Non-pressure chronic ulcer of other part of left foot limited to breakdown of skin: Secondary | ICD-10-CM | POA: Diagnosis not present

## 2022-06-17 DIAGNOSIS — D62 Acute posthemorrhagic anemia: Secondary | ICD-10-CM | POA: Diagnosis not present

## 2022-06-22 DIAGNOSIS — I70245 Atherosclerosis of native arteries of left leg with ulceration of other part of foot: Secondary | ICD-10-CM | POA: Diagnosis not present

## 2022-06-22 DIAGNOSIS — I110 Hypertensive heart disease with heart failure: Secondary | ICD-10-CM | POA: Diagnosis not present

## 2022-06-22 DIAGNOSIS — Z48812 Encounter for surgical aftercare following surgery on the circulatory system: Secondary | ICD-10-CM | POA: Diagnosis not present

## 2022-06-22 DIAGNOSIS — I5042 Chronic combined systolic (congestive) and diastolic (congestive) heart failure: Secondary | ICD-10-CM | POA: Diagnosis not present

## 2022-06-22 DIAGNOSIS — J439 Emphysema, unspecified: Secondary | ICD-10-CM | POA: Diagnosis not present

## 2022-06-22 DIAGNOSIS — T8789 Other complications of amputation stump: Secondary | ICD-10-CM | POA: Diagnosis not present

## 2022-06-22 DIAGNOSIS — L97521 Non-pressure chronic ulcer of other part of left foot limited to breakdown of skin: Secondary | ICD-10-CM | POA: Diagnosis not present

## 2022-06-22 DIAGNOSIS — D62 Acute posthemorrhagic anemia: Secondary | ICD-10-CM | POA: Diagnosis not present

## 2022-06-22 DIAGNOSIS — J449 Chronic obstructive pulmonary disease, unspecified: Secondary | ICD-10-CM | POA: Diagnosis not present

## 2022-06-23 ENCOUNTER — Telehealth (INDEPENDENT_AMBULATORY_CARE_PROVIDER_SITE_OTHER): Payer: Self-pay | Admitting: Vascular Surgery

## 2022-06-23 DIAGNOSIS — D62 Acute posthemorrhagic anemia: Secondary | ICD-10-CM | POA: Diagnosis not present

## 2022-06-23 DIAGNOSIS — T8789 Other complications of amputation stump: Secondary | ICD-10-CM | POA: Diagnosis not present

## 2022-06-23 DIAGNOSIS — L97521 Non-pressure chronic ulcer of other part of left foot limited to breakdown of skin: Secondary | ICD-10-CM | POA: Diagnosis not present

## 2022-06-23 DIAGNOSIS — J439 Emphysema, unspecified: Secondary | ICD-10-CM | POA: Diagnosis not present

## 2022-06-23 DIAGNOSIS — I110 Hypertensive heart disease with heart failure: Secondary | ICD-10-CM | POA: Diagnosis not present

## 2022-06-23 DIAGNOSIS — I70245 Atherosclerosis of native arteries of left leg with ulceration of other part of foot: Secondary | ICD-10-CM | POA: Diagnosis not present

## 2022-06-23 DIAGNOSIS — I5042 Chronic combined systolic (congestive) and diastolic (congestive) heart failure: Secondary | ICD-10-CM | POA: Diagnosis not present

## 2022-06-23 DIAGNOSIS — J449 Chronic obstructive pulmonary disease, unspecified: Secondary | ICD-10-CM | POA: Diagnosis not present

## 2022-06-23 DIAGNOSIS — Z48812 Encounter for surgical aftercare following surgery on the circulatory system: Secondary | ICD-10-CM | POA: Diagnosis not present

## 2022-06-23 NOTE — Telephone Encounter (Signed)
Patient's husband states the patch that goes to leg, the hours ran out on it and nurse wanted to know if they could go ahead and remove it or what.  Please advise.  Spoke to NP and was told to tell the nurse to remove and cover it with gauzes.

## 2022-06-29 DIAGNOSIS — D62 Acute posthemorrhagic anemia: Secondary | ICD-10-CM | POA: Diagnosis not present

## 2022-06-29 DIAGNOSIS — I5042 Chronic combined systolic (congestive) and diastolic (congestive) heart failure: Secondary | ICD-10-CM | POA: Diagnosis not present

## 2022-06-29 DIAGNOSIS — I70245 Atherosclerosis of native arteries of left leg with ulceration of other part of foot: Secondary | ICD-10-CM | POA: Diagnosis not present

## 2022-06-29 DIAGNOSIS — J449 Chronic obstructive pulmonary disease, unspecified: Secondary | ICD-10-CM | POA: Diagnosis not present

## 2022-06-29 DIAGNOSIS — L97521 Non-pressure chronic ulcer of other part of left foot limited to breakdown of skin: Secondary | ICD-10-CM | POA: Diagnosis not present

## 2022-06-29 DIAGNOSIS — I110 Hypertensive heart disease with heart failure: Secondary | ICD-10-CM | POA: Diagnosis not present

## 2022-06-29 DIAGNOSIS — J439 Emphysema, unspecified: Secondary | ICD-10-CM | POA: Diagnosis not present

## 2022-06-29 DIAGNOSIS — T8789 Other complications of amputation stump: Secondary | ICD-10-CM | POA: Diagnosis not present

## 2022-06-29 DIAGNOSIS — Z48812 Encounter for surgical aftercare following surgery on the circulatory system: Secondary | ICD-10-CM | POA: Diagnosis not present

## 2022-07-06 DIAGNOSIS — Z48812 Encounter for surgical aftercare following surgery on the circulatory system: Secondary | ICD-10-CM | POA: Diagnosis not present

## 2022-07-06 DIAGNOSIS — D62 Acute posthemorrhagic anemia: Secondary | ICD-10-CM | POA: Diagnosis not present

## 2022-07-06 DIAGNOSIS — T8789 Other complications of amputation stump: Secondary | ICD-10-CM | POA: Diagnosis not present

## 2022-07-06 DIAGNOSIS — I70245 Atherosclerosis of native arteries of left leg with ulceration of other part of foot: Secondary | ICD-10-CM | POA: Diagnosis not present

## 2022-07-06 DIAGNOSIS — J449 Chronic obstructive pulmonary disease, unspecified: Secondary | ICD-10-CM | POA: Diagnosis not present

## 2022-07-06 DIAGNOSIS — I5042 Chronic combined systolic (congestive) and diastolic (congestive) heart failure: Secondary | ICD-10-CM | POA: Diagnosis not present

## 2022-07-06 DIAGNOSIS — L97521 Non-pressure chronic ulcer of other part of left foot limited to breakdown of skin: Secondary | ICD-10-CM | POA: Diagnosis not present

## 2022-07-06 DIAGNOSIS — I110 Hypertensive heart disease with heart failure: Secondary | ICD-10-CM | POA: Diagnosis not present

## 2022-07-06 DIAGNOSIS — J439 Emphysema, unspecified: Secondary | ICD-10-CM | POA: Diagnosis not present

## 2022-07-12 DIAGNOSIS — T8789 Other complications of amputation stump: Secondary | ICD-10-CM | POA: Diagnosis not present

## 2022-07-12 DIAGNOSIS — I110 Hypertensive heart disease with heart failure: Secondary | ICD-10-CM | POA: Diagnosis not present

## 2022-07-12 DIAGNOSIS — D62 Acute posthemorrhagic anemia: Secondary | ICD-10-CM | POA: Diagnosis not present

## 2022-07-12 DIAGNOSIS — J439 Emphysema, unspecified: Secondary | ICD-10-CM | POA: Diagnosis not present

## 2022-07-12 DIAGNOSIS — Z48812 Encounter for surgical aftercare following surgery on the circulatory system: Secondary | ICD-10-CM | POA: Diagnosis not present

## 2022-07-12 DIAGNOSIS — L97521 Non-pressure chronic ulcer of other part of left foot limited to breakdown of skin: Secondary | ICD-10-CM | POA: Diagnosis not present

## 2022-07-12 DIAGNOSIS — I5042 Chronic combined systolic (congestive) and diastolic (congestive) heart failure: Secondary | ICD-10-CM | POA: Diagnosis not present

## 2022-07-12 DIAGNOSIS — I70245 Atherosclerosis of native arteries of left leg with ulceration of other part of foot: Secondary | ICD-10-CM | POA: Diagnosis not present

## 2022-07-12 DIAGNOSIS — J449 Chronic obstructive pulmonary disease, unspecified: Secondary | ICD-10-CM | POA: Diagnosis not present

## 2022-07-13 DIAGNOSIS — I110 Hypertensive heart disease with heart failure: Secondary | ICD-10-CM | POA: Diagnosis not present

## 2022-07-13 DIAGNOSIS — L97521 Non-pressure chronic ulcer of other part of left foot limited to breakdown of skin: Secondary | ICD-10-CM | POA: Diagnosis not present

## 2022-07-13 DIAGNOSIS — D62 Acute posthemorrhagic anemia: Secondary | ICD-10-CM | POA: Diagnosis not present

## 2022-07-13 DIAGNOSIS — T8789 Other complications of amputation stump: Secondary | ICD-10-CM | POA: Diagnosis not present

## 2022-07-13 DIAGNOSIS — I70245 Atherosclerosis of native arteries of left leg with ulceration of other part of foot: Secondary | ICD-10-CM | POA: Diagnosis not present

## 2022-07-13 DIAGNOSIS — I5042 Chronic combined systolic (congestive) and diastolic (congestive) heart failure: Secondary | ICD-10-CM | POA: Diagnosis not present

## 2022-07-13 DIAGNOSIS — J439 Emphysema, unspecified: Secondary | ICD-10-CM | POA: Diagnosis not present

## 2022-07-13 DIAGNOSIS — J449 Chronic obstructive pulmonary disease, unspecified: Secondary | ICD-10-CM | POA: Diagnosis not present

## 2022-07-13 DIAGNOSIS — Z48812 Encounter for surgical aftercare following surgery on the circulatory system: Secondary | ICD-10-CM | POA: Diagnosis not present

## 2022-07-15 DIAGNOSIS — L97521 Non-pressure chronic ulcer of other part of left foot limited to breakdown of skin: Secondary | ICD-10-CM | POA: Diagnosis not present

## 2022-07-15 DIAGNOSIS — J439 Emphysema, unspecified: Secondary | ICD-10-CM | POA: Diagnosis not present

## 2022-07-15 DIAGNOSIS — J449 Chronic obstructive pulmonary disease, unspecified: Secondary | ICD-10-CM | POA: Diagnosis not present

## 2022-07-15 DIAGNOSIS — T8789 Other complications of amputation stump: Secondary | ICD-10-CM | POA: Diagnosis not present

## 2022-07-15 DIAGNOSIS — Z48812 Encounter for surgical aftercare following surgery on the circulatory system: Secondary | ICD-10-CM | POA: Diagnosis not present

## 2022-07-15 DIAGNOSIS — I110 Hypertensive heart disease with heart failure: Secondary | ICD-10-CM | POA: Diagnosis not present

## 2022-07-15 DIAGNOSIS — I70245 Atherosclerosis of native arteries of left leg with ulceration of other part of foot: Secondary | ICD-10-CM | POA: Diagnosis not present

## 2022-07-15 DIAGNOSIS — I5042 Chronic combined systolic (congestive) and diastolic (congestive) heart failure: Secondary | ICD-10-CM | POA: Diagnosis not present

## 2022-07-15 DIAGNOSIS — D62 Acute posthemorrhagic anemia: Secondary | ICD-10-CM | POA: Diagnosis not present

## 2022-07-18 DIAGNOSIS — T8789 Other complications of amputation stump: Secondary | ICD-10-CM | POA: Diagnosis not present

## 2022-07-18 DIAGNOSIS — L97521 Non-pressure chronic ulcer of other part of left foot limited to breakdown of skin: Secondary | ICD-10-CM | POA: Diagnosis not present

## 2022-07-18 DIAGNOSIS — I70245 Atherosclerosis of native arteries of left leg with ulceration of other part of foot: Secondary | ICD-10-CM | POA: Diagnosis not present

## 2022-07-18 DIAGNOSIS — Z48812 Encounter for surgical aftercare following surgery on the circulatory system: Secondary | ICD-10-CM | POA: Diagnosis not present

## 2022-07-18 DIAGNOSIS — I5042 Chronic combined systolic (congestive) and diastolic (congestive) heart failure: Secondary | ICD-10-CM | POA: Diagnosis not present

## 2022-07-18 DIAGNOSIS — D62 Acute posthemorrhagic anemia: Secondary | ICD-10-CM | POA: Diagnosis not present

## 2022-07-18 DIAGNOSIS — I110 Hypertensive heart disease with heart failure: Secondary | ICD-10-CM | POA: Diagnosis not present

## 2022-07-20 DIAGNOSIS — D62 Acute posthemorrhagic anemia: Secondary | ICD-10-CM | POA: Diagnosis not present

## 2022-07-20 DIAGNOSIS — I5042 Chronic combined systolic (congestive) and diastolic (congestive) heart failure: Secondary | ICD-10-CM | POA: Diagnosis not present

## 2022-07-20 DIAGNOSIS — J439 Emphysema, unspecified: Secondary | ICD-10-CM | POA: Diagnosis not present

## 2022-07-20 DIAGNOSIS — L97521 Non-pressure chronic ulcer of other part of left foot limited to breakdown of skin: Secondary | ICD-10-CM | POA: Diagnosis not present

## 2022-07-20 DIAGNOSIS — Z48812 Encounter for surgical aftercare following surgery on the circulatory system: Secondary | ICD-10-CM | POA: Diagnosis not present

## 2022-07-20 DIAGNOSIS — T8789 Other complications of amputation stump: Secondary | ICD-10-CM | POA: Diagnosis not present

## 2022-07-20 DIAGNOSIS — I70245 Atherosclerosis of native arteries of left leg with ulceration of other part of foot: Secondary | ICD-10-CM | POA: Diagnosis not present

## 2022-07-20 DIAGNOSIS — J449 Chronic obstructive pulmonary disease, unspecified: Secondary | ICD-10-CM | POA: Diagnosis not present

## 2022-07-20 DIAGNOSIS — I110 Hypertensive heart disease with heart failure: Secondary | ICD-10-CM | POA: Diagnosis not present

## 2022-07-27 DIAGNOSIS — J449 Chronic obstructive pulmonary disease, unspecified: Secondary | ICD-10-CM | POA: Diagnosis not present

## 2022-07-27 DIAGNOSIS — I110 Hypertensive heart disease with heart failure: Secondary | ICD-10-CM | POA: Diagnosis not present

## 2022-07-27 DIAGNOSIS — J439 Emphysema, unspecified: Secondary | ICD-10-CM | POA: Diagnosis not present

## 2022-07-27 DIAGNOSIS — I5042 Chronic combined systolic (congestive) and diastolic (congestive) heart failure: Secondary | ICD-10-CM | POA: Diagnosis not present

## 2022-07-27 DIAGNOSIS — T8789 Other complications of amputation stump: Secondary | ICD-10-CM | POA: Diagnosis not present

## 2022-07-27 DIAGNOSIS — Z48812 Encounter for surgical aftercare following surgery on the circulatory system: Secondary | ICD-10-CM | POA: Diagnosis not present

## 2022-07-27 DIAGNOSIS — I70245 Atherosclerosis of native arteries of left leg with ulceration of other part of foot: Secondary | ICD-10-CM | POA: Diagnosis not present

## 2022-07-27 DIAGNOSIS — D62 Acute posthemorrhagic anemia: Secondary | ICD-10-CM | POA: Diagnosis not present

## 2022-07-27 DIAGNOSIS — L97521 Non-pressure chronic ulcer of other part of left foot limited to breakdown of skin: Secondary | ICD-10-CM | POA: Diagnosis not present

## 2022-07-28 DIAGNOSIS — D62 Acute posthemorrhagic anemia: Secondary | ICD-10-CM | POA: Diagnosis not present

## 2022-07-28 DIAGNOSIS — J439 Emphysema, unspecified: Secondary | ICD-10-CM | POA: Diagnosis not present

## 2022-07-28 DIAGNOSIS — J449 Chronic obstructive pulmonary disease, unspecified: Secondary | ICD-10-CM | POA: Diagnosis not present

## 2022-07-28 DIAGNOSIS — L97521 Non-pressure chronic ulcer of other part of left foot limited to breakdown of skin: Secondary | ICD-10-CM | POA: Diagnosis not present

## 2022-07-28 DIAGNOSIS — I5042 Chronic combined systolic (congestive) and diastolic (congestive) heart failure: Secondary | ICD-10-CM | POA: Diagnosis not present

## 2022-07-28 DIAGNOSIS — I70245 Atherosclerosis of native arteries of left leg with ulceration of other part of foot: Secondary | ICD-10-CM | POA: Diagnosis not present

## 2022-07-28 DIAGNOSIS — T8789 Other complications of amputation stump: Secondary | ICD-10-CM | POA: Diagnosis not present

## 2022-07-28 DIAGNOSIS — Z48812 Encounter for surgical aftercare following surgery on the circulatory system: Secondary | ICD-10-CM | POA: Diagnosis not present

## 2022-07-28 DIAGNOSIS — I110 Hypertensive heart disease with heart failure: Secondary | ICD-10-CM | POA: Diagnosis not present

## 2022-08-02 DIAGNOSIS — J449 Chronic obstructive pulmonary disease, unspecified: Secondary | ICD-10-CM | POA: Diagnosis not present

## 2022-08-02 DIAGNOSIS — J439 Emphysema, unspecified: Secondary | ICD-10-CM | POA: Diagnosis not present

## 2022-08-02 DIAGNOSIS — D62 Acute posthemorrhagic anemia: Secondary | ICD-10-CM | POA: Diagnosis not present

## 2022-08-02 DIAGNOSIS — I5042 Chronic combined systolic (congestive) and diastolic (congestive) heart failure: Secondary | ICD-10-CM | POA: Diagnosis not present

## 2022-08-02 DIAGNOSIS — I110 Hypertensive heart disease with heart failure: Secondary | ICD-10-CM | POA: Diagnosis not present

## 2022-08-02 DIAGNOSIS — T8789 Other complications of amputation stump: Secondary | ICD-10-CM | POA: Diagnosis not present

## 2022-08-02 DIAGNOSIS — Z48812 Encounter for surgical aftercare following surgery on the circulatory system: Secondary | ICD-10-CM | POA: Diagnosis not present

## 2022-08-02 DIAGNOSIS — I70245 Atherosclerosis of native arteries of left leg with ulceration of other part of foot: Secondary | ICD-10-CM | POA: Diagnosis not present

## 2022-08-02 DIAGNOSIS — L97521 Non-pressure chronic ulcer of other part of left foot limited to breakdown of skin: Secondary | ICD-10-CM | POA: Diagnosis not present

## 2022-08-09 DIAGNOSIS — T8789 Other complications of amputation stump: Secondary | ICD-10-CM | POA: Diagnosis not present

## 2022-08-09 DIAGNOSIS — J439 Emphysema, unspecified: Secondary | ICD-10-CM | POA: Diagnosis not present

## 2022-08-09 DIAGNOSIS — I70245 Atherosclerosis of native arteries of left leg with ulceration of other part of foot: Secondary | ICD-10-CM | POA: Diagnosis not present

## 2022-08-09 DIAGNOSIS — D62 Acute posthemorrhagic anemia: Secondary | ICD-10-CM | POA: Diagnosis not present

## 2022-08-09 DIAGNOSIS — I110 Hypertensive heart disease with heart failure: Secondary | ICD-10-CM | POA: Diagnosis not present

## 2022-08-09 DIAGNOSIS — L97521 Non-pressure chronic ulcer of other part of left foot limited to breakdown of skin: Secondary | ICD-10-CM | POA: Diagnosis not present

## 2022-08-09 DIAGNOSIS — I5042 Chronic combined systolic (congestive) and diastolic (congestive) heart failure: Secondary | ICD-10-CM | POA: Diagnosis not present

## 2022-08-09 DIAGNOSIS — J449 Chronic obstructive pulmonary disease, unspecified: Secondary | ICD-10-CM | POA: Diagnosis not present

## 2022-08-09 DIAGNOSIS — Z48812 Encounter for surgical aftercare following surgery on the circulatory system: Secondary | ICD-10-CM | POA: Diagnosis not present

## 2022-08-14 DIAGNOSIS — L97522 Non-pressure chronic ulcer of other part of left foot with fat layer exposed: Secondary | ICD-10-CM | POA: Diagnosis not present

## 2022-08-14 DIAGNOSIS — T8789 Other complications of amputation stump: Secondary | ICD-10-CM | POA: Diagnosis not present

## 2022-08-14 DIAGNOSIS — L89620 Pressure ulcer of left heel, unstageable: Secondary | ICD-10-CM | POA: Diagnosis not present

## 2022-08-14 DIAGNOSIS — I5042 Chronic combined systolic (congestive) and diastolic (congestive) heart failure: Secondary | ICD-10-CM | POA: Diagnosis not present

## 2022-08-14 DIAGNOSIS — I110 Hypertensive heart disease with heart failure: Secondary | ICD-10-CM | POA: Diagnosis not present

## 2022-08-14 DIAGNOSIS — J449 Chronic obstructive pulmonary disease, unspecified: Secondary | ICD-10-CM | POA: Diagnosis not present

## 2022-08-14 DIAGNOSIS — I70245 Atherosclerosis of native arteries of left leg with ulceration of other part of foot: Secondary | ICD-10-CM | POA: Diagnosis not present

## 2022-08-14 DIAGNOSIS — J439 Emphysema, unspecified: Secondary | ICD-10-CM | POA: Diagnosis not present

## 2022-08-14 DIAGNOSIS — D62 Acute posthemorrhagic anemia: Secondary | ICD-10-CM | POA: Diagnosis not present

## 2022-08-16 DIAGNOSIS — J439 Emphysema, unspecified: Secondary | ICD-10-CM | POA: Diagnosis not present

## 2022-08-16 DIAGNOSIS — I5042 Chronic combined systolic (congestive) and diastolic (congestive) heart failure: Secondary | ICD-10-CM | POA: Diagnosis not present

## 2022-08-16 DIAGNOSIS — I110 Hypertensive heart disease with heart failure: Secondary | ICD-10-CM | POA: Diagnosis not present

## 2022-08-16 DIAGNOSIS — L97522 Non-pressure chronic ulcer of other part of left foot with fat layer exposed: Secondary | ICD-10-CM | POA: Diagnosis not present

## 2022-08-16 DIAGNOSIS — D62 Acute posthemorrhagic anemia: Secondary | ICD-10-CM | POA: Diagnosis not present

## 2022-08-16 DIAGNOSIS — L89620 Pressure ulcer of left heel, unstageable: Secondary | ICD-10-CM | POA: Diagnosis not present

## 2022-08-16 DIAGNOSIS — I70245 Atherosclerosis of native arteries of left leg with ulceration of other part of foot: Secondary | ICD-10-CM | POA: Diagnosis not present

## 2022-08-16 DIAGNOSIS — T8789 Other complications of amputation stump: Secondary | ICD-10-CM | POA: Diagnosis not present

## 2022-08-16 DIAGNOSIS — J449 Chronic obstructive pulmonary disease, unspecified: Secondary | ICD-10-CM | POA: Diagnosis not present

## 2022-08-22 DIAGNOSIS — I70245 Atherosclerosis of native arteries of left leg with ulceration of other part of foot: Secondary | ICD-10-CM | POA: Diagnosis not present

## 2022-08-22 DIAGNOSIS — D62 Acute posthemorrhagic anemia: Secondary | ICD-10-CM | POA: Diagnosis not present

## 2022-08-22 DIAGNOSIS — L89620 Pressure ulcer of left heel, unstageable: Secondary | ICD-10-CM | POA: Diagnosis not present

## 2022-08-22 DIAGNOSIS — I110 Hypertensive heart disease with heart failure: Secondary | ICD-10-CM | POA: Diagnosis not present

## 2022-08-22 DIAGNOSIS — J439 Emphysema, unspecified: Secondary | ICD-10-CM | POA: Diagnosis not present

## 2022-08-22 DIAGNOSIS — L97522 Non-pressure chronic ulcer of other part of left foot with fat layer exposed: Secondary | ICD-10-CM | POA: Diagnosis not present

## 2022-08-22 DIAGNOSIS — T8789 Other complications of amputation stump: Secondary | ICD-10-CM | POA: Diagnosis not present

## 2022-08-22 DIAGNOSIS — J449 Chronic obstructive pulmonary disease, unspecified: Secondary | ICD-10-CM | POA: Diagnosis not present

## 2022-08-22 DIAGNOSIS — I5042 Chronic combined systolic (congestive) and diastolic (congestive) heart failure: Secondary | ICD-10-CM | POA: Diagnosis not present

## 2022-08-23 DIAGNOSIS — I5042 Chronic combined systolic (congestive) and diastolic (congestive) heart failure: Secondary | ICD-10-CM | POA: Diagnosis not present

## 2022-08-23 DIAGNOSIS — L89620 Pressure ulcer of left heel, unstageable: Secondary | ICD-10-CM | POA: Diagnosis not present

## 2022-08-23 DIAGNOSIS — I110 Hypertensive heart disease with heart failure: Secondary | ICD-10-CM | POA: Diagnosis not present

## 2022-08-23 DIAGNOSIS — L97522 Non-pressure chronic ulcer of other part of left foot with fat layer exposed: Secondary | ICD-10-CM | POA: Diagnosis not present

## 2022-08-23 DIAGNOSIS — D62 Acute posthemorrhagic anemia: Secondary | ICD-10-CM | POA: Diagnosis not present

## 2022-08-23 DIAGNOSIS — I70245 Atherosclerosis of native arteries of left leg with ulceration of other part of foot: Secondary | ICD-10-CM | POA: Diagnosis not present

## 2022-08-23 DIAGNOSIS — J439 Emphysema, unspecified: Secondary | ICD-10-CM | POA: Diagnosis not present

## 2022-08-23 DIAGNOSIS — J449 Chronic obstructive pulmonary disease, unspecified: Secondary | ICD-10-CM | POA: Diagnosis not present

## 2022-08-23 DIAGNOSIS — T8789 Other complications of amputation stump: Secondary | ICD-10-CM | POA: Diagnosis not present

## 2022-08-30 DIAGNOSIS — J449 Chronic obstructive pulmonary disease, unspecified: Secondary | ICD-10-CM | POA: Diagnosis not present

## 2022-08-30 DIAGNOSIS — D62 Acute posthemorrhagic anemia: Secondary | ICD-10-CM | POA: Diagnosis not present

## 2022-08-30 DIAGNOSIS — L97522 Non-pressure chronic ulcer of other part of left foot with fat layer exposed: Secondary | ICD-10-CM | POA: Diagnosis not present

## 2022-08-30 DIAGNOSIS — L89620 Pressure ulcer of left heel, unstageable: Secondary | ICD-10-CM | POA: Diagnosis not present

## 2022-08-30 DIAGNOSIS — I110 Hypertensive heart disease with heart failure: Secondary | ICD-10-CM | POA: Diagnosis not present

## 2022-08-30 DIAGNOSIS — I5042 Chronic combined systolic (congestive) and diastolic (congestive) heart failure: Secondary | ICD-10-CM | POA: Diagnosis not present

## 2022-08-30 DIAGNOSIS — I70245 Atherosclerosis of native arteries of left leg with ulceration of other part of foot: Secondary | ICD-10-CM | POA: Diagnosis not present

## 2022-08-30 DIAGNOSIS — J439 Emphysema, unspecified: Secondary | ICD-10-CM | POA: Diagnosis not present

## 2022-08-30 DIAGNOSIS — T8789 Other complications of amputation stump: Secondary | ICD-10-CM | POA: Diagnosis not present

## 2022-09-05 DIAGNOSIS — J439 Emphysema, unspecified: Secondary | ICD-10-CM | POA: Diagnosis not present

## 2022-09-05 DIAGNOSIS — J449 Chronic obstructive pulmonary disease, unspecified: Secondary | ICD-10-CM | POA: Diagnosis not present

## 2022-09-05 DIAGNOSIS — I70245 Atherosclerosis of native arteries of left leg with ulceration of other part of foot: Secondary | ICD-10-CM | POA: Diagnosis not present

## 2022-09-05 DIAGNOSIS — L97522 Non-pressure chronic ulcer of other part of left foot with fat layer exposed: Secondary | ICD-10-CM | POA: Diagnosis not present

## 2022-09-05 DIAGNOSIS — D62 Acute posthemorrhagic anemia: Secondary | ICD-10-CM | POA: Diagnosis not present

## 2022-09-05 DIAGNOSIS — T8789 Other complications of amputation stump: Secondary | ICD-10-CM | POA: Diagnosis not present

## 2022-09-05 DIAGNOSIS — I5042 Chronic combined systolic (congestive) and diastolic (congestive) heart failure: Secondary | ICD-10-CM | POA: Diagnosis not present

## 2022-09-05 DIAGNOSIS — L89620 Pressure ulcer of left heel, unstageable: Secondary | ICD-10-CM | POA: Diagnosis not present

## 2022-09-05 DIAGNOSIS — I110 Hypertensive heart disease with heart failure: Secondary | ICD-10-CM | POA: Diagnosis not present

## 2022-09-06 DIAGNOSIS — D62 Acute posthemorrhagic anemia: Secondary | ICD-10-CM | POA: Diagnosis not present

## 2022-09-06 DIAGNOSIS — J439 Emphysema, unspecified: Secondary | ICD-10-CM | POA: Diagnosis not present

## 2022-09-06 DIAGNOSIS — I70245 Atherosclerosis of native arteries of left leg with ulceration of other part of foot: Secondary | ICD-10-CM | POA: Diagnosis not present

## 2022-09-06 DIAGNOSIS — L97522 Non-pressure chronic ulcer of other part of left foot with fat layer exposed: Secondary | ICD-10-CM | POA: Diagnosis not present

## 2022-09-06 DIAGNOSIS — I5042 Chronic combined systolic (congestive) and diastolic (congestive) heart failure: Secondary | ICD-10-CM | POA: Diagnosis not present

## 2022-09-06 DIAGNOSIS — I110 Hypertensive heart disease with heart failure: Secondary | ICD-10-CM | POA: Diagnosis not present

## 2022-09-06 DIAGNOSIS — L89620 Pressure ulcer of left heel, unstageable: Secondary | ICD-10-CM | POA: Diagnosis not present

## 2022-09-06 DIAGNOSIS — T8789 Other complications of amputation stump: Secondary | ICD-10-CM | POA: Diagnosis not present

## 2022-09-06 DIAGNOSIS — J449 Chronic obstructive pulmonary disease, unspecified: Secondary | ICD-10-CM | POA: Diagnosis not present

## 2022-09-13 DIAGNOSIS — I110 Hypertensive heart disease with heart failure: Secondary | ICD-10-CM | POA: Diagnosis not present

## 2022-09-13 DIAGNOSIS — I5042 Chronic combined systolic (congestive) and diastolic (congestive) heart failure: Secondary | ICD-10-CM | POA: Diagnosis not present

## 2022-09-13 DIAGNOSIS — J449 Chronic obstructive pulmonary disease, unspecified: Secondary | ICD-10-CM | POA: Diagnosis not present

## 2022-09-13 DIAGNOSIS — T8789 Other complications of amputation stump: Secondary | ICD-10-CM | POA: Diagnosis not present

## 2022-09-13 DIAGNOSIS — L89620 Pressure ulcer of left heel, unstageable: Secondary | ICD-10-CM | POA: Diagnosis not present

## 2022-09-13 DIAGNOSIS — D62 Acute posthemorrhagic anemia: Secondary | ICD-10-CM | POA: Diagnosis not present

## 2022-09-13 DIAGNOSIS — L97522 Non-pressure chronic ulcer of other part of left foot with fat layer exposed: Secondary | ICD-10-CM | POA: Diagnosis not present

## 2022-09-13 DIAGNOSIS — I70245 Atherosclerosis of native arteries of left leg with ulceration of other part of foot: Secondary | ICD-10-CM | POA: Diagnosis not present

## 2022-09-13 DIAGNOSIS — J439 Emphysema, unspecified: Secondary | ICD-10-CM | POA: Diagnosis not present

## 2022-09-19 DIAGNOSIS — L89620 Pressure ulcer of left heel, unstageable: Secondary | ICD-10-CM | POA: Diagnosis not present

## 2022-09-19 DIAGNOSIS — L97522 Non-pressure chronic ulcer of other part of left foot with fat layer exposed: Secondary | ICD-10-CM | POA: Diagnosis not present

## 2022-09-19 DIAGNOSIS — J439 Emphysema, unspecified: Secondary | ICD-10-CM | POA: Diagnosis not present

## 2022-09-19 DIAGNOSIS — J449 Chronic obstructive pulmonary disease, unspecified: Secondary | ICD-10-CM | POA: Diagnosis not present

## 2022-09-19 DIAGNOSIS — I110 Hypertensive heart disease with heart failure: Secondary | ICD-10-CM | POA: Diagnosis not present

## 2022-09-19 DIAGNOSIS — D62 Acute posthemorrhagic anemia: Secondary | ICD-10-CM | POA: Diagnosis not present

## 2022-09-19 DIAGNOSIS — T8789 Other complications of amputation stump: Secondary | ICD-10-CM | POA: Diagnosis not present

## 2022-09-19 DIAGNOSIS — I5042 Chronic combined systolic (congestive) and diastolic (congestive) heart failure: Secondary | ICD-10-CM | POA: Diagnosis not present

## 2022-09-19 DIAGNOSIS — I70245 Atherosclerosis of native arteries of left leg with ulceration of other part of foot: Secondary | ICD-10-CM | POA: Diagnosis not present

## 2022-09-20 DIAGNOSIS — L89620 Pressure ulcer of left heel, unstageable: Secondary | ICD-10-CM | POA: Diagnosis not present

## 2022-09-20 DIAGNOSIS — T8789 Other complications of amputation stump: Secondary | ICD-10-CM | POA: Diagnosis not present

## 2022-09-20 DIAGNOSIS — J439 Emphysema, unspecified: Secondary | ICD-10-CM | POA: Diagnosis not present

## 2022-09-20 DIAGNOSIS — I110 Hypertensive heart disease with heart failure: Secondary | ICD-10-CM | POA: Diagnosis not present

## 2022-09-20 DIAGNOSIS — D62 Acute posthemorrhagic anemia: Secondary | ICD-10-CM | POA: Diagnosis not present

## 2022-09-20 DIAGNOSIS — L97522 Non-pressure chronic ulcer of other part of left foot with fat layer exposed: Secondary | ICD-10-CM | POA: Diagnosis not present

## 2022-09-20 DIAGNOSIS — I70245 Atherosclerosis of native arteries of left leg with ulceration of other part of foot: Secondary | ICD-10-CM | POA: Diagnosis not present

## 2022-09-20 DIAGNOSIS — I5042 Chronic combined systolic (congestive) and diastolic (congestive) heart failure: Secondary | ICD-10-CM | POA: Diagnosis not present

## 2022-09-20 DIAGNOSIS — J449 Chronic obstructive pulmonary disease, unspecified: Secondary | ICD-10-CM | POA: Diagnosis not present

## 2022-09-27 DIAGNOSIS — I70245 Atherosclerosis of native arteries of left leg with ulceration of other part of foot: Secondary | ICD-10-CM | POA: Diagnosis not present

## 2022-09-27 DIAGNOSIS — I5042 Chronic combined systolic (congestive) and diastolic (congestive) heart failure: Secondary | ICD-10-CM | POA: Diagnosis not present

## 2022-09-27 DIAGNOSIS — L89620 Pressure ulcer of left heel, unstageable: Secondary | ICD-10-CM | POA: Diagnosis not present

## 2022-09-27 DIAGNOSIS — J449 Chronic obstructive pulmonary disease, unspecified: Secondary | ICD-10-CM | POA: Diagnosis not present

## 2022-09-27 DIAGNOSIS — L97522 Non-pressure chronic ulcer of other part of left foot with fat layer exposed: Secondary | ICD-10-CM | POA: Diagnosis not present

## 2022-09-27 DIAGNOSIS — D62 Acute posthemorrhagic anemia: Secondary | ICD-10-CM | POA: Diagnosis not present

## 2022-09-27 DIAGNOSIS — I110 Hypertensive heart disease with heart failure: Secondary | ICD-10-CM | POA: Diagnosis not present

## 2022-09-27 DIAGNOSIS — T8789 Other complications of amputation stump: Secondary | ICD-10-CM | POA: Diagnosis not present

## 2022-09-27 DIAGNOSIS — J439 Emphysema, unspecified: Secondary | ICD-10-CM | POA: Diagnosis not present

## 2022-09-28 DIAGNOSIS — L97522 Non-pressure chronic ulcer of other part of left foot with fat layer exposed: Secondary | ICD-10-CM | POA: Diagnosis not present

## 2022-09-28 DIAGNOSIS — J449 Chronic obstructive pulmonary disease, unspecified: Secondary | ICD-10-CM | POA: Diagnosis not present

## 2022-09-28 DIAGNOSIS — L89620 Pressure ulcer of left heel, unstageable: Secondary | ICD-10-CM | POA: Diagnosis not present

## 2022-09-28 DIAGNOSIS — I5042 Chronic combined systolic (congestive) and diastolic (congestive) heart failure: Secondary | ICD-10-CM | POA: Diagnosis not present

## 2022-09-28 DIAGNOSIS — I110 Hypertensive heart disease with heart failure: Secondary | ICD-10-CM | POA: Diagnosis not present

## 2022-09-28 DIAGNOSIS — I70245 Atherosclerosis of native arteries of left leg with ulceration of other part of foot: Secondary | ICD-10-CM | POA: Diagnosis not present

## 2022-09-28 DIAGNOSIS — T8789 Other complications of amputation stump: Secondary | ICD-10-CM | POA: Diagnosis not present

## 2022-09-28 DIAGNOSIS — J439 Emphysema, unspecified: Secondary | ICD-10-CM | POA: Diagnosis not present

## 2022-09-28 DIAGNOSIS — D62 Acute posthemorrhagic anemia: Secondary | ICD-10-CM | POA: Diagnosis not present

## 2022-10-04 DIAGNOSIS — I70245 Atherosclerosis of native arteries of left leg with ulceration of other part of foot: Secondary | ICD-10-CM | POA: Diagnosis not present

## 2022-10-04 DIAGNOSIS — J449 Chronic obstructive pulmonary disease, unspecified: Secondary | ICD-10-CM | POA: Diagnosis not present

## 2022-10-04 DIAGNOSIS — I5042 Chronic combined systolic (congestive) and diastolic (congestive) heart failure: Secondary | ICD-10-CM | POA: Diagnosis not present

## 2022-10-04 DIAGNOSIS — L89620 Pressure ulcer of left heel, unstageable: Secondary | ICD-10-CM | POA: Diagnosis not present

## 2022-10-04 DIAGNOSIS — D62 Acute posthemorrhagic anemia: Secondary | ICD-10-CM | POA: Diagnosis not present

## 2022-10-04 DIAGNOSIS — I110 Hypertensive heart disease with heart failure: Secondary | ICD-10-CM | POA: Diagnosis not present

## 2022-10-04 DIAGNOSIS — L97522 Non-pressure chronic ulcer of other part of left foot with fat layer exposed: Secondary | ICD-10-CM | POA: Diagnosis not present

## 2022-10-04 DIAGNOSIS — T8789 Other complications of amputation stump: Secondary | ICD-10-CM | POA: Diagnosis not present

## 2022-10-04 DIAGNOSIS — J439 Emphysema, unspecified: Secondary | ICD-10-CM | POA: Diagnosis not present

## 2022-10-10 DIAGNOSIS — T8789 Other complications of amputation stump: Secondary | ICD-10-CM | POA: Diagnosis not present

## 2022-10-10 DIAGNOSIS — D62 Acute posthemorrhagic anemia: Secondary | ICD-10-CM | POA: Diagnosis not present

## 2022-10-10 DIAGNOSIS — J439 Emphysema, unspecified: Secondary | ICD-10-CM | POA: Diagnosis not present

## 2022-10-10 DIAGNOSIS — I5042 Chronic combined systolic (congestive) and diastolic (congestive) heart failure: Secondary | ICD-10-CM | POA: Diagnosis not present

## 2022-10-10 DIAGNOSIS — I70245 Atherosclerosis of native arteries of left leg with ulceration of other part of foot: Secondary | ICD-10-CM | POA: Diagnosis not present

## 2022-10-10 DIAGNOSIS — I110 Hypertensive heart disease with heart failure: Secondary | ICD-10-CM | POA: Diagnosis not present

## 2022-10-10 DIAGNOSIS — J449 Chronic obstructive pulmonary disease, unspecified: Secondary | ICD-10-CM | POA: Diagnosis not present

## 2022-10-10 DIAGNOSIS — L97522 Non-pressure chronic ulcer of other part of left foot with fat layer exposed: Secondary | ICD-10-CM | POA: Diagnosis not present

## 2022-10-10 DIAGNOSIS — L89620 Pressure ulcer of left heel, unstageable: Secondary | ICD-10-CM | POA: Diagnosis not present

## 2022-10-12 DIAGNOSIS — L97522 Non-pressure chronic ulcer of other part of left foot with fat layer exposed: Secondary | ICD-10-CM | POA: Diagnosis not present

## 2022-10-12 DIAGNOSIS — J449 Chronic obstructive pulmonary disease, unspecified: Secondary | ICD-10-CM | POA: Diagnosis not present

## 2022-10-12 DIAGNOSIS — I5042 Chronic combined systolic (congestive) and diastolic (congestive) heart failure: Secondary | ICD-10-CM | POA: Diagnosis not present

## 2022-10-12 DIAGNOSIS — I70245 Atherosclerosis of native arteries of left leg with ulceration of other part of foot: Secondary | ICD-10-CM | POA: Diagnosis not present

## 2022-10-12 DIAGNOSIS — T8789 Other complications of amputation stump: Secondary | ICD-10-CM | POA: Diagnosis not present

## 2022-10-12 DIAGNOSIS — D62 Acute posthemorrhagic anemia: Secondary | ICD-10-CM | POA: Diagnosis not present

## 2022-10-12 DIAGNOSIS — J439 Emphysema, unspecified: Secondary | ICD-10-CM | POA: Diagnosis not present

## 2022-10-12 DIAGNOSIS — L89620 Pressure ulcer of left heel, unstageable: Secondary | ICD-10-CM | POA: Diagnosis not present

## 2022-10-12 DIAGNOSIS — I110 Hypertensive heart disease with heart failure: Secondary | ICD-10-CM | POA: Diagnosis not present

## 2022-10-19 IMAGING — MR MR HEAD W/O CM
13 series · 48 of 48 positions shown · non-contrast
Comparison: 12/10/2014

CLINICAL DATA: Acute neurologic deficit acute neurologic deficit

EXAM:
MRI HEAD WITHOUT CONTRAST
TECHNIQUE: Multiplanar, multiecho pulse sequences of the brain and surrounding
structures were obtained without intravenous contrast.

[Series 7: ax dwi_tracew · axial · 3.0mm · 0.71mm/px · z∈[-42,+100]mm · 3 of 56 slices shown]
[im 1/56]
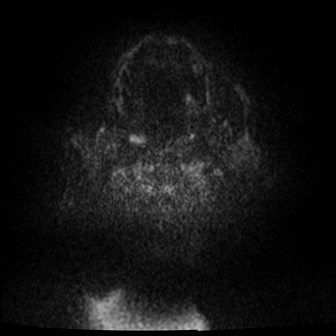
[im 28/56]
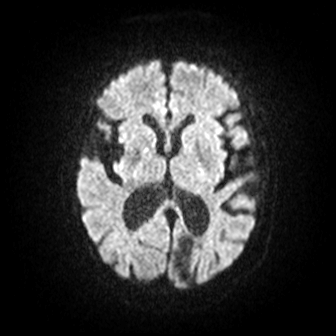
[im 56/56]
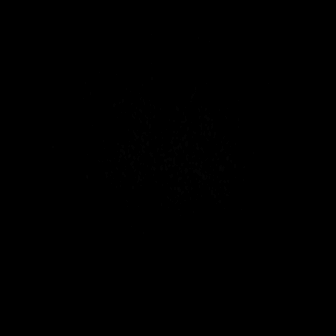

[Series 8: ax dwi_adc · axial · 3.0mm · 0.71mm/px · z∈[-42,+98]mm · 3 of 55 slices shown]
[im 1/55]
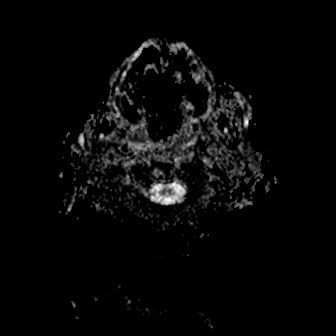
[im 28/55]
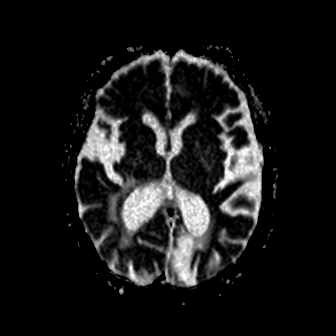
[im 55/55]
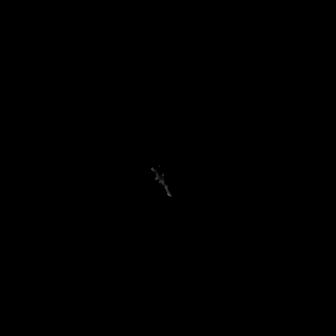

[Series 9: cor dwi_tracew · coronal · 5.0mm · 0.68mm/px · 3 of 40 slices shown]
[im 1/40]
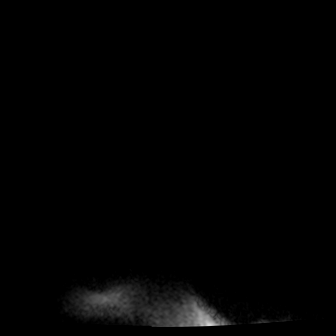
[im 20/40]
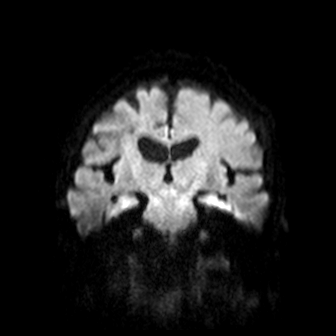
[im 40/40]
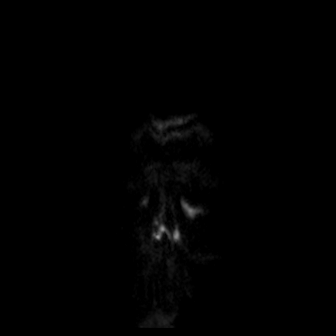

[Series 10: cor dwi_adc · coronal · 5.0mm · 0.68mm/px · 3 of 40 slices shown]
[im 1/40]
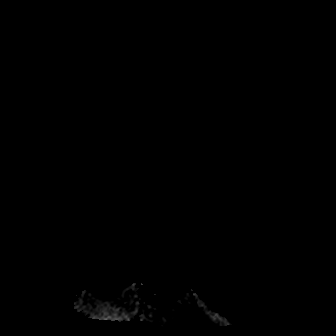
[im 20/40]
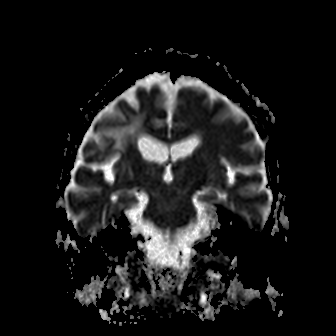
[im 40/40]
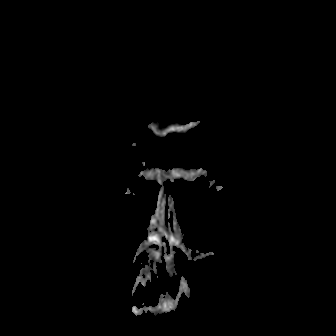

[Series 11: T1 · sagittal · 5.0mm · 0.47mm/px · 2 of 24 slices shown (1 of 2)]
[im 1/24]
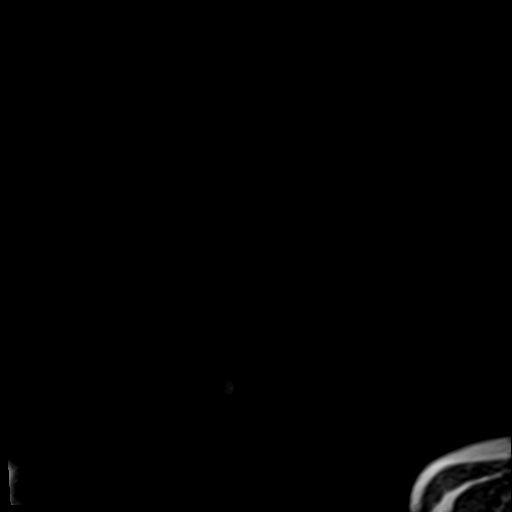
[im 24/24]
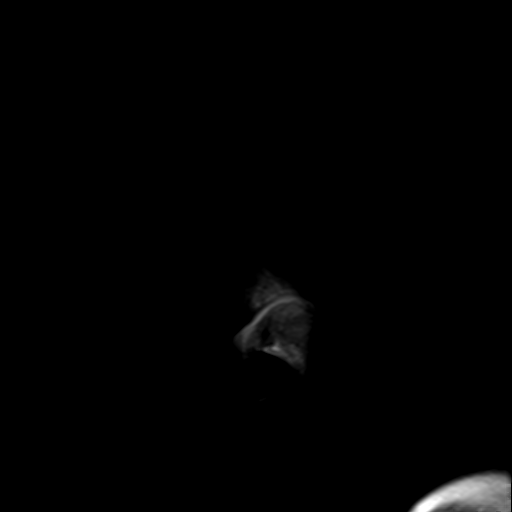

[Series 12: T2 · axial · 5.0mm · 0.86mm/px · z∈[-40,+90]mm · 2 of 26 slices shown (1 of 2)]
[im 1/26]
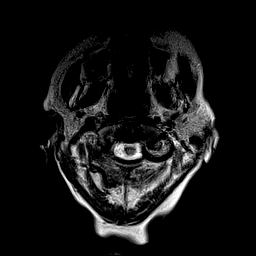
[im 26/26]
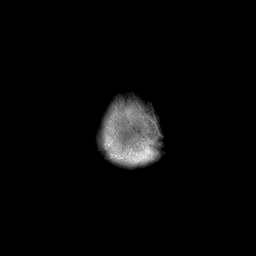

[Series 13: ax swi_mag · axial · 3.0mm · 0.90mm/px · z∈[-43,+99]mm · 4 of 56 slices shown]
[im 1/56]
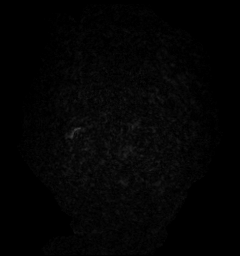
[im 19/56]
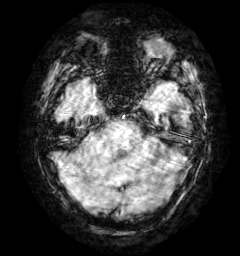
[im 37/56]
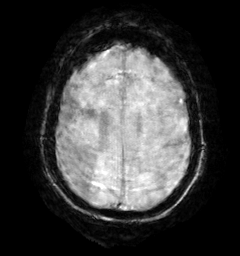
[im 56/56]
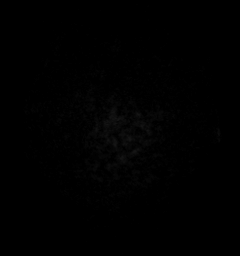

[Series 14: ax swi_pha · axial · 3.0mm · 0.90mm/px · z∈[-43,+99]mm · 4 of 56 slices shown]
[im 1/56]
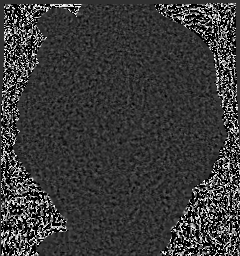
[im 19/56]
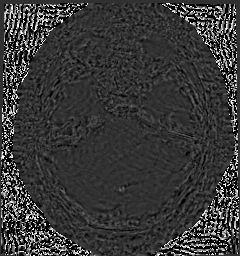
[im 37/56]
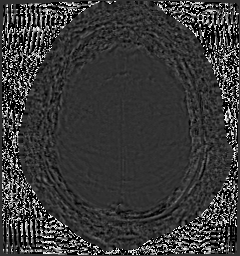
[im 56/56]
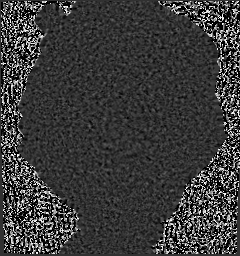

[Series 15: ax swi_swi · axial · 3.0mm · 0.90mm/px · z∈[-43,+99]mm · 4 of 56 slices shown]
[im 1/56]
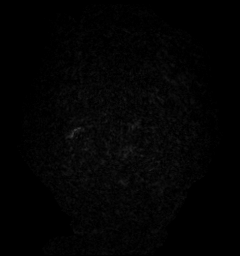
[im 19/56]
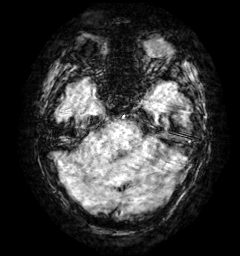
[im 37/56]
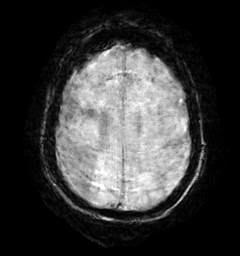
[im 56/56]
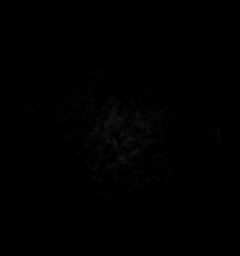

[Series 17: T1 · axial · 1.0mm · 0.98mm/px · z∈[-43,+108]mm · 12 of 176 slices shown (2 of 2)]
[im 1/176]
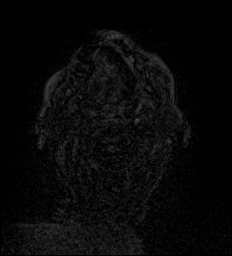
[im 16/176]
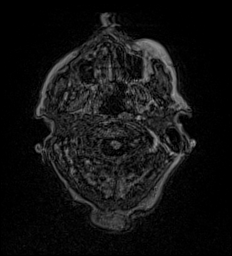
[im 32/176]
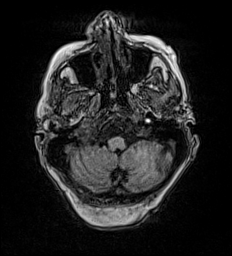
[im 48/176]
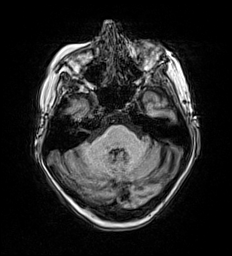
[im 64/176]
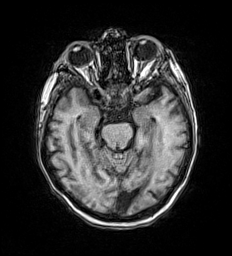
[im 80/176]
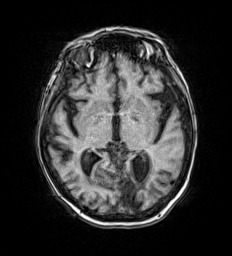
[im 96/176]
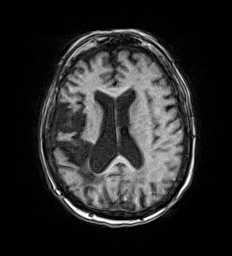
[im 112/176]
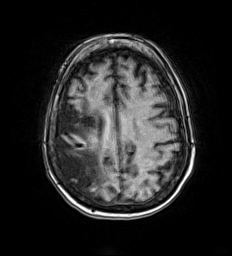
[im 128/176]
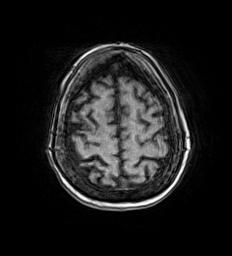
[im 144/176]
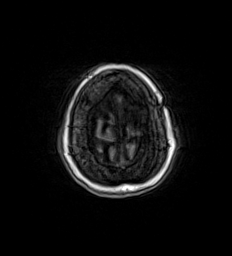
[im 160/176]
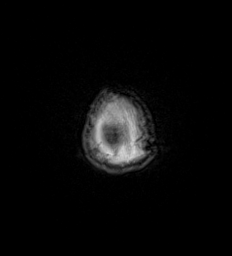
[im 176/176]
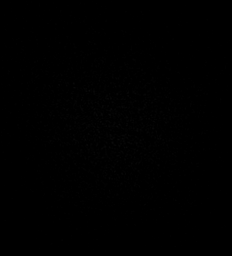

[Series 18: T2 · coronal · 5.0mm · 0.86mm/px · 2 of 30 slices shown (2 of 2)]
[im 1/30]
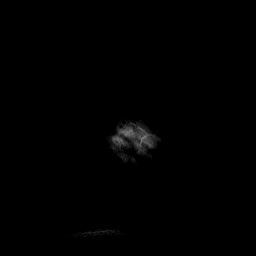
[im 30/30]
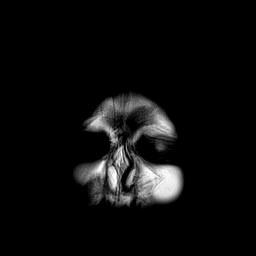

[Series 19: FLAIR · axial · 3.0mm · 0.69mm/px · z∈[-45,+95]mm · 4 of 55 slices shown]
[im 1/55]
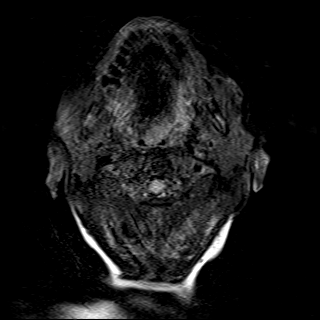
[im 19/55]
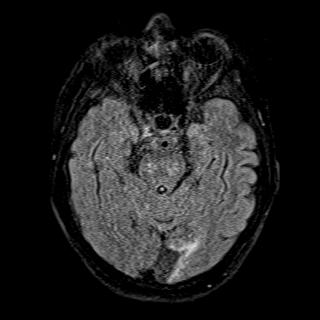
[im 37/55]
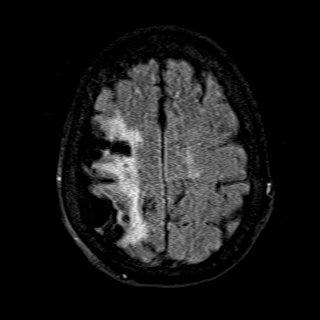
[im 55/55]
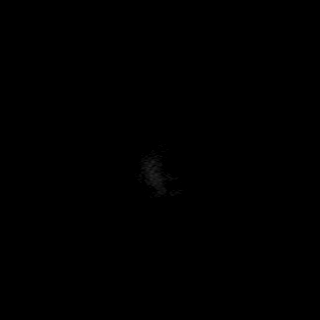

[Series 20: T2-star · axial · 5.0mm · 0.45mm/px · z∈[-57,+86]mm · 2 of 27 slices shown]
[im 1/27]
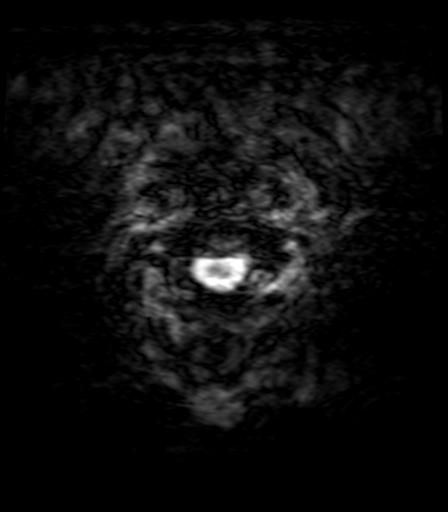
[im 27/27]
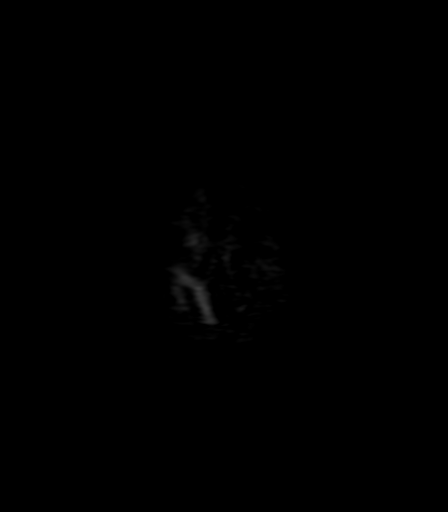

[48 of 48 positions shown; findings below may reference images not displayed]

FINDINGS: Brain: Large, old infarcts within the right MCA and left PCA
territories. No acute infarct. No acute or chronic hemorrhage. There
is multifocal hyperintense T2-weighted signal within the white
matter. Generalized cerebral volume loss. The midline structures are
normal.

Vascular: Major flow voids are preserved.

Skull and upper cervical spine: Normal calvarium and skull base.
Visualized upper cervical spine and soft tissues are normal.

Sinuses/Orbits:No paranasal sinus fluid levels or advanced mucosal
thickening. No mastoid or middle ear effusion. Normal orbits.
IMPRESSION: 1. No acute intracranial abnormality.
2. Large, old infarcts within the right MCA and left PCA
territories.

## 2023-03-21 ENCOUNTER — Other Ambulatory Visit: Payer: Self-pay

## 2023-03-21 ENCOUNTER — Emergency Department: Payer: Medicare HMO

## 2023-03-21 ENCOUNTER — Inpatient Hospital Stay
Admission: EM | Admit: 2023-03-21 | Discharge: 2023-04-03 | DRG: 854 | Disposition: A | Discharge status: Home Health | Payer: Medicare HMO | Attending: Internal Medicine | Admitting: Internal Medicine

## 2023-03-21 DIAGNOSIS — I739 Peripheral vascular disease, unspecified: Secondary | ICD-10-CM | POA: Diagnosis present

## 2023-03-21 DIAGNOSIS — L89629 Pressure ulcer of left heel, unspecified stage: Secondary | ICD-10-CM | POA: Diagnosis present

## 2023-03-21 DIAGNOSIS — I96 Gangrene, not elsewhere classified: Secondary | ICD-10-CM | POA: Diagnosis not present

## 2023-03-21 DIAGNOSIS — N1831 Chronic kidney disease, stage 3a: Secondary | ICD-10-CM | POA: Diagnosis present

## 2023-03-21 DIAGNOSIS — E872 Acidosis, unspecified: Secondary | ICD-10-CM | POA: Diagnosis not present

## 2023-03-21 DIAGNOSIS — R6520 Severe sepsis without septic shock: Secondary | ICD-10-CM | POA: Diagnosis not present

## 2023-03-21 DIAGNOSIS — I70221 Atherosclerosis of native arteries of extremities with rest pain, right leg: Secondary | ICD-10-CM | POA: Diagnosis not present

## 2023-03-21 DIAGNOSIS — L97529 Non-pressure chronic ulcer of other part of left foot with unspecified severity: Secondary | ICD-10-CM | POA: Diagnosis not present

## 2023-03-21 DIAGNOSIS — D649 Anemia, unspecified: Secondary | ICD-10-CM | POA: Diagnosis not present

## 2023-03-21 DIAGNOSIS — Z7982 Long term (current) use of aspirin: Secondary | ICD-10-CM | POA: Diagnosis not present

## 2023-03-21 DIAGNOSIS — Z803 Family history of malignant neoplasm of breast: Secondary | ICD-10-CM

## 2023-03-21 DIAGNOSIS — Z89412 Acquired absence of left great toe: Secondary | ICD-10-CM | POA: Diagnosis not present

## 2023-03-21 DIAGNOSIS — I70222 Atherosclerosis of native arteries of extremities with rest pain, left leg: Secondary | ICD-10-CM | POA: Diagnosis present

## 2023-03-21 DIAGNOSIS — Z820 Family history of epilepsy and other diseases of the nervous system: Secondary | ICD-10-CM

## 2023-03-21 DIAGNOSIS — F32A Depression, unspecified: Secondary | ICD-10-CM | POA: Diagnosis present

## 2023-03-21 DIAGNOSIS — I70245 Atherosclerosis of native arteries of left leg with ulceration of other part of foot: Secondary | ICD-10-CM | POA: Diagnosis not present

## 2023-03-21 DIAGNOSIS — Z681 Body mass index (BMI) 19 or less, adult: Secondary | ICD-10-CM

## 2023-03-21 DIAGNOSIS — E785 Hyperlipidemia, unspecified: Secondary | ICD-10-CM | POA: Diagnosis present

## 2023-03-21 DIAGNOSIS — B029 Zoster without complications: Secondary | ICD-10-CM | POA: Diagnosis present

## 2023-03-21 DIAGNOSIS — F17210 Nicotine dependence, cigarettes, uncomplicated: Secondary | ICD-10-CM | POA: Diagnosis not present

## 2023-03-21 DIAGNOSIS — E871 Hypo-osmolality and hyponatremia: Secondary | ICD-10-CM | POA: Diagnosis not present

## 2023-03-21 DIAGNOSIS — Z79899 Other long term (current) drug therapy: Secondary | ICD-10-CM

## 2023-03-21 DIAGNOSIS — L97429 Non-pressure chronic ulcer of left heel and midfoot with unspecified severity: Secondary | ICD-10-CM | POA: Diagnosis not present

## 2023-03-21 DIAGNOSIS — Z95828 Presence of other vascular implants and grafts: Secondary | ICD-10-CM | POA: Diagnosis not present

## 2023-03-21 DIAGNOSIS — D509 Iron deficiency anemia, unspecified: Secondary | ICD-10-CM | POA: Diagnosis present

## 2023-03-21 DIAGNOSIS — R569 Unspecified convulsions: Secondary | ICD-10-CM | POA: Diagnosis present

## 2023-03-21 DIAGNOSIS — I70262 Atherosclerosis of native arteries of extremities with gangrene, left leg: Secondary | ICD-10-CM | POA: Diagnosis present

## 2023-03-21 DIAGNOSIS — H9190 Unspecified hearing loss, unspecified ear: Secondary | ICD-10-CM | POA: Diagnosis present

## 2023-03-21 DIAGNOSIS — I2510 Atherosclerotic heart disease of native coronary artery without angina pectoris: Secondary | ICD-10-CM | POA: Diagnosis present

## 2023-03-21 DIAGNOSIS — I998 Other disorder of circulatory system: Secondary | ICD-10-CM | POA: Diagnosis not present

## 2023-03-21 DIAGNOSIS — M79672 Pain in left foot: Principal | ICD-10-CM

## 2023-03-21 DIAGNOSIS — J449 Chronic obstructive pulmonary disease, unspecified: Secondary | ICD-10-CM | POA: Diagnosis present

## 2023-03-21 DIAGNOSIS — Z7401 Bed confinement status: Secondary | ICD-10-CM | POA: Diagnosis not present

## 2023-03-21 DIAGNOSIS — I708 Atherosclerosis of other arteries: Secondary | ICD-10-CM | POA: Diagnosis present

## 2023-03-21 DIAGNOSIS — I70244 Atherosclerosis of native arteries of left leg with ulceration of heel and midfoot: Secondary | ICD-10-CM | POA: Diagnosis not present

## 2023-03-21 DIAGNOSIS — G9389 Other specified disorders of brain: Secondary | ICD-10-CM | POA: Diagnosis not present

## 2023-03-21 DIAGNOSIS — Z801 Family history of malignant neoplasm of trachea, bronchus and lung: Secondary | ICD-10-CM

## 2023-03-21 DIAGNOSIS — A419 Sepsis, unspecified organism: Secondary | ICD-10-CM | POA: Diagnosis not present

## 2023-03-21 DIAGNOSIS — I6930 Unspecified sequelae of cerebral infarction: Secondary | ICD-10-CM | POA: Diagnosis not present

## 2023-03-21 DIAGNOSIS — I69354 Hemiplegia and hemiparesis following cerebral infarction affecting left non-dominant side: Secondary | ICD-10-CM | POA: Diagnosis not present

## 2023-03-21 DIAGNOSIS — E861 Hypovolemia: Secondary | ICD-10-CM | POA: Diagnosis present

## 2023-03-21 DIAGNOSIS — L97422 Non-pressure chronic ulcer of left heel and midfoot with fat layer exposed: Secondary | ICD-10-CM | POA: Diagnosis not present

## 2023-03-21 DIAGNOSIS — Z89422 Acquired absence of other left toe(s): Secondary | ICD-10-CM

## 2023-03-21 DIAGNOSIS — Z9889 Other specified postprocedural states: Secondary | ICD-10-CM | POA: Diagnosis not present

## 2023-03-21 DIAGNOSIS — K219 Gastro-esophageal reflux disease without esophagitis: Secondary | ICD-10-CM | POA: Diagnosis present

## 2023-03-21 DIAGNOSIS — L03116 Cellulitis of left lower limb: Secondary | ICD-10-CM | POA: Diagnosis present

## 2023-03-21 DIAGNOSIS — Z23 Encounter for immunization: Secondary | ICD-10-CM

## 2023-03-21 DIAGNOSIS — F17200 Nicotine dependence, unspecified, uncomplicated: Secondary | ICD-10-CM | POA: Diagnosis not present

## 2023-03-21 DIAGNOSIS — R54 Age-related physical debility: Secondary | ICD-10-CM | POA: Diagnosis present

## 2023-03-21 DIAGNOSIS — Z7902 Long term (current) use of antithrombotics/antiplatelets: Secondary | ICD-10-CM | POA: Diagnosis not present

## 2023-03-21 DIAGNOSIS — I130 Hypertensive heart and chronic kidney disease with heart failure and stage 1 through stage 4 chronic kidney disease, or unspecified chronic kidney disease: Secondary | ICD-10-CM | POA: Diagnosis present

## 2023-03-21 DIAGNOSIS — Z5986 Financial insecurity: Secondary | ICD-10-CM

## 2023-03-21 DIAGNOSIS — F411 Generalized anxiety disorder: Secondary | ICD-10-CM | POA: Diagnosis present

## 2023-03-21 DIAGNOSIS — Z87891 Personal history of nicotine dependence: Secondary | ICD-10-CM | POA: Diagnosis not present

## 2023-03-21 DIAGNOSIS — R29818 Other symptoms and signs involving the nervous system: Secondary | ICD-10-CM | POA: Diagnosis not present

## 2023-03-21 DIAGNOSIS — L0390 Cellulitis, unspecified: Secondary | ICD-10-CM

## 2023-03-21 DIAGNOSIS — R69 Illness, unspecified: Secondary | ICD-10-CM | POA: Diagnosis not present

## 2023-03-21 DIAGNOSIS — M79673 Pain in unspecified foot: Secondary | ICD-10-CM | POA: Diagnosis not present

## 2023-03-21 DIAGNOSIS — I10 Essential (primary) hypertension: Secondary | ICD-10-CM | POA: Diagnosis present

## 2023-03-21 DIAGNOSIS — F418 Other specified anxiety disorders: Secondary | ICD-10-CM | POA: Diagnosis present

## 2023-03-21 DIAGNOSIS — I7092 Chronic total occlusion of artery of the extremities: Secondary | ICD-10-CM | POA: Diagnosis not present

## 2023-03-21 DIAGNOSIS — I959 Hypotension, unspecified: Secondary | ICD-10-CM | POA: Diagnosis not present

## 2023-03-21 DIAGNOSIS — J439 Emphysema, unspecified: Secondary | ICD-10-CM | POA: Diagnosis present

## 2023-03-21 DIAGNOSIS — I9581 Postprocedural hypotension: Secondary | ICD-10-CM | POA: Diagnosis not present

## 2023-03-21 DIAGNOSIS — I5042 Chronic combined systolic (congestive) and diastolic (congestive) heart failure: Secondary | ICD-10-CM | POA: Diagnosis not present

## 2023-03-21 DIAGNOSIS — M79605 Pain in left leg: Secondary | ICD-10-CM | POA: Diagnosis not present

## 2023-03-21 DIAGNOSIS — I110 Hypertensive heart disease with heart failure: Secondary | ICD-10-CM | POA: Diagnosis not present

## 2023-03-21 LAB — BASIC METABOLIC PANEL
Anion gap: 10 (ref 5–15)
BUN: 21 mg/dL (ref 8–23)
CO2: 24 mmol/L (ref 22–32)
Calcium: 9.3 mg/dL (ref 8.9–10.3)
Chloride: 104 mmol/L (ref 98–111)
Creatinine, Ser: 0.69 mg/dL (ref 0.44–1.00)
GFR, Estimated: >60 mL/min (ref >60)
Glucose, Bld: 116 mg/dL — ABNORMAL HIGH (ref 70–99)
Potassium: 4 mmol/L (ref 3.5–5.1)
Sodium: 138 mmol/L (ref 135–145)

## 2023-03-21 LAB — LACTIC ACID, PLASMA
Lactic Acid, Venous: 2.1 mmol/L (ref 0.5–1.9)
Lactic Acid, Venous: 1.5 mmol/L (ref 0.5–1.9)

## 2023-03-21 LAB — IRON AND TIBC
Iron: 37 ug/dL (ref 28–170)
Saturation Ratios: 9 % — ABNORMAL LOW (ref 10.4–31.8)
TIBC: 430 ug/dL (ref 250–450)
UIBC: 393 ug/dL

## 2023-03-21 LAB — CBC WITH DIFFERENTIAL/PLATELET
Abs Immature Granulocytes: 0.06 10*3/uL (ref 0.00–0.07)
Basophils Absolute: 0 10*3/uL (ref 0.0–0.1)
Basophils Relative: 0 %
Eosinophils Absolute: 0.1 10*3/uL (ref 0.0–0.5)
Eosinophils Relative: 1 %
HCT: 24.2 % — ABNORMAL LOW (ref 36.0–46.0)
Hemoglobin: 6.4 g/dL — ABNORMAL LOW (ref 12.0–15.0)
Immature Granulocytes: 1 %
Lymphocytes Relative: 8 %
Lymphs Abs: 0.8 10*3/uL (ref 0.7–4.0)
MCH: 17.5 pg — ABNORMAL LOW (ref 26.0–34.0)
MCHC: 26.4 g/dL — ABNORMAL LOW (ref 30.0–36.0)
MCV: 66.3 fL — ABNORMAL LOW (ref 80.0–100.0)
Monocytes Absolute: 0.7 10*3/uL (ref 0.1–1.0)
Monocytes Relative: 7 %
Neutro Abs: 8.2 10*3/uL — ABNORMAL HIGH (ref 1.7–7.7)
Neutrophils Relative %: 83 %
Platelets: 493 10*3/uL — ABNORMAL HIGH (ref 150–400)
RBC: 3.65 MIL/uL — ABNORMAL LOW (ref 3.87–5.11)
RDW: 22 % — ABNORMAL HIGH (ref 11.5–15.5)
Smear Review: NORMAL
WBC: 9.8 10*3/uL (ref 4.0–10.5)
nRBC: 0 % (ref 0.0–0.2)

## 2023-03-21 LAB — FERRITIN: Ferritin: 6 ng/mL — ABNORMAL LOW (ref 11–307)

## 2023-03-21 LAB — PREPARE RBC (CROSSMATCH)

## 2023-03-21 MED ORDER — ENOXAPARIN SODIUM 40 MG/0.4ML IJ SOSY
40.0000 mg | PREFILLED_SYRINGE | INTRAMUSCULAR | Status: DC
Start: 2023-03-21 — End: 2023-03-22
  Administered 2023-03-21: 40 mg via SUBCUTANEOUS
  Filled 2023-03-21: qty 0.4

## 2023-03-21 MED ORDER — ACETAMINOPHEN 325 MG PO TABS
650.0000 mg | ORAL_TABLET | Freq: Four times a day (QID) | ORAL | Status: DC | PRN
  Administered 2023-03-21 – 2023-03-27 (×12): 650 mg via ORAL
  Filled 2023-03-21 (×12): qty 2

## 2023-03-21 MED ORDER — ONDANSETRON HCL 4 MG PO TABS
4.0000 mg | ORAL_TABLET | Freq: Four times a day (QID) | ORAL | Status: DC | PRN
  Administered 2023-03-25 – 2023-03-31 (×3): 4 mg via ORAL
  Filled 2023-03-21 (×3): qty 1

## 2023-03-21 MED ORDER — SENNOSIDES-DOCUSATE SODIUM 8.6-50 MG PO TABS
2.0000 | ORAL_TABLET | Freq: Two times a day (BID) | ORAL | Status: DC
  Administered 2023-03-21 – 2023-04-03 (×23): 2 via ORAL
  Filled 2023-03-21 (×23): qty 2

## 2023-03-21 MED ORDER — BUSPIRONE HCL 15 MG PO TABS
7.5000 mg | ORAL_TABLET | Freq: Two times a day (BID) | ORAL | Status: DC
  Administered 2023-03-21 – 2023-04-03 (×25): 7.5 mg via ORAL
  Filled 2023-03-21 (×28): qty 1

## 2023-03-21 MED ORDER — GABAPENTIN 100 MG PO CAPS
100.0000 mg | ORAL_CAPSULE | Freq: Once | ORAL | Status: AC
  Administered 2023-03-21: 100 mg via ORAL
  Filled 2023-03-21: qty 1

## 2023-03-21 MED ORDER — FUROSEMIDE 20 MG PO TABS
20.0000 mg | ORAL_TABLET | Freq: Every day | ORAL | Status: DC
  Administered 2023-03-22 – 2023-04-03 (×12): 20 mg via ORAL
  Filled 2023-03-21 (×12): qty 1

## 2023-03-21 MED ORDER — LACTATED RINGERS IV SOLN: INTRAVENOUS | Status: DC

## 2023-03-21 MED ORDER — LACTATED RINGERS IV BOLUS (SEPSIS): 250.0000 mL | Freq: Once | INTRAVENOUS | Status: DC

## 2023-03-21 MED ORDER — LACTATED RINGERS IV BOLUS (SEPSIS): 500.0000 mL | Freq: Once | INTRAVENOUS | Status: DC

## 2023-03-21 MED ORDER — ACETAMINOPHEN 650 MG RE SUPP: 650.0000 mg | Freq: Four times a day (QID) | RECTAL | Status: DC | PRN

## 2023-03-21 MED ORDER — ASPIRIN 81 MG PO TBEC
81.0000 mg | DELAYED_RELEASE_TABLET | Freq: Every day | ORAL | Status: DC
  Administered 2023-03-22 – 2023-04-03 (×12): 81 mg via ORAL
  Filled 2023-03-21 (×12): qty 1

## 2023-03-21 MED ORDER — CARVEDILOL 3.125 MG PO TABS
3.1250 mg | ORAL_TABLET | Freq: Two times a day (BID) | ORAL | Status: DC
  Administered 2023-03-21 – 2023-03-22 (×2): 3.125 mg via ORAL
  Filled 2023-03-21 (×2): qty 1

## 2023-03-21 MED ORDER — SODIUM CHLORIDE 0.9 % IV SOLN: 10.0000 mL/h | Freq: Once | INTRAVENOUS | Status: DC

## 2023-03-21 MED ORDER — ONDANSETRON HCL 4 MG/2ML IJ SOLN
4.0000 mg | Freq: Four times a day (QID) | INTRAMUSCULAR | Status: DC | PRN
  Administered 2023-03-28: 4 mg via INTRAVENOUS
  Filled 2023-03-21: qty 2

## 2023-03-21 MED ORDER — FENTANYL CITRATE PF 50 MCG/ML IJ SOSY
50.0000 ug | PREFILLED_SYRINGE | Freq: Once | INTRAMUSCULAR | Status: AC
  Administered 2023-03-21: 50 ug via INTRAVENOUS
  Filled 2023-03-21: qty 1

## 2023-03-21 MED ORDER — VALACYCLOVIR HCL 500 MG PO TABS
500.0000 mg | ORAL_TABLET | Freq: Two times a day (BID) | ORAL | Status: DC
  Administered 2023-03-21 – 2023-04-03 (×25): 500 mg via ORAL
  Filled 2023-03-21 (×28): qty 1

## 2023-03-21 MED ORDER — SODIUM CHLORIDE 0.9 % IV SOLN
2.0000 g | INTRAVENOUS | Status: AC
  Administered 2023-03-21 – 2023-03-26 (×6): 2 g via INTRAVENOUS
  Filled 2023-03-21 (×7): qty 20

## 2023-03-21 MED ORDER — HYDROXYZINE HCL 10 MG PO TABS
10.0000 mg | ORAL_TABLET | Freq: Two times a day (BID) | ORAL | Status: DC | PRN
  Administered 2023-03-22 – 2023-04-02 (×12): 10 mg via ORAL
  Filled 2023-03-21 (×15): qty 1

## 2023-03-21 MED ORDER — INFLUENZA VAC A&B SURF ANT ADJ 0.5 ML IM SUSY
0.5000 mL | PREFILLED_SYRINGE | INTRAMUSCULAR | Status: AC
  Administered 2023-03-22: 0.5 mL via INTRAMUSCULAR
  Filled 2023-03-21: qty 0.5

## 2023-03-21 MED ORDER — VANCOMYCIN HCL 750 MG/150ML IV SOLN
750.0000 mg | INTRAVENOUS | Status: DC
  Administered 2023-03-22: 750 mg via INTRAVENOUS
  Filled 2023-03-21 (×2): qty 150

## 2023-03-21 MED ORDER — LACTATED RINGERS IV SOLN: 150.0000 mL/h | INTRAVENOUS | Status: DC

## 2023-03-21 MED ORDER — VANCOMYCIN HCL IN DEXTROSE 1-5 GM/200ML-% IV SOLN
1000.0000 mg | Freq: Once | INTRAVENOUS | Status: AC
  Administered 2023-03-21: 1000 mg via INTRAVENOUS
  Filled 2023-03-21: qty 200

## 2023-03-21 MED ORDER — OXYCODONE HCL 5 MG PO TABS
5.0000 mg | ORAL_TABLET | Freq: Once | ORAL | Status: AC
  Administered 2023-03-21: 5 mg via ORAL
  Filled 2023-03-21: qty 1

## 2023-03-21 MED ORDER — NICOTINE 14 MG/24HR TD PT24
14.0000 mg | MEDICATED_PATCH | Freq: Every day | TRANSDERMAL | Status: DC
  Administered 2023-03-21 – 2023-04-03 (×13): 14 mg via TRANSDERMAL
  Filled 2023-03-21 (×13): qty 1

## 2023-03-21 MED ORDER — OXYCODONE HCL 5 MG PO TABS
5.0000 mg | ORAL_TABLET | ORAL | Status: DC | PRN
  Administered 2023-03-21 – 2023-03-22 (×4): 5 mg via ORAL
  Filled 2023-03-21 (×4): qty 1

## 2023-03-21 MED ORDER — VANCOMYCIN HCL IN DEXTROSE 1-5 GM/200ML-% IV SOLN
1000.0000 mg | Freq: Once | INTRAVENOUS | Status: DC
Start: 2023-03-21 — End: 2023-03-21

## 2023-03-21 MED ORDER — LACTATED RINGERS IV BOLUS (SEPSIS): 1000.0000 mL | Freq: Once | INTRAVENOUS | Status: DC

## 2023-03-21 MED ORDER — IPRATROPIUM-ALBUTEROL 0.5-2.5 (3) MG/3ML IN SOLN: 3.0000 mL | RESPIRATORY_TRACT | Status: DC | PRN

## 2023-03-21 MED ORDER — ATORVASTATIN CALCIUM 20 MG PO TABS
20.0000 mg | ORAL_TABLET | Freq: Every day | ORAL | Status: DC
  Administered 2023-03-22 – 2023-04-03 (×12): 20 mg via ORAL
  Filled 2023-03-21 (×12): qty 1

## 2023-03-21 MED ORDER — SERTRALINE HCL 50 MG PO TABS
100.0000 mg | ORAL_TABLET | Freq: Every day | ORAL | Status: DC
Start: 2023-03-22 — End: 2023-04-04
  Administered 2023-03-22 – 2023-04-03 (×12): 100 mg via ORAL
  Filled 2023-03-21 (×12): qty 2

## 2023-03-21 MED ORDER — CLOPIDOGREL BISULFATE 75 MG PO TABS
75.0000 mg | ORAL_TABLET | Freq: Every day | ORAL | Status: DC
  Administered 2023-03-22 – 2023-04-03 (×12): 75 mg via ORAL
  Filled 2023-03-21 (×12): qty 1

## 2023-03-21 MED ORDER — FAMOTIDINE 20 MG PO TABS: 20.0000 mg | ORAL_TABLET | Freq: Two times a day (BID) | ORAL | Status: DC | PRN

## 2023-03-21 NOTE — ED Provider Notes (Signed)
[facility] Provider Note    Event Date/Time   First MD Initiated Contact with Patient 03/21/23 1056     (approximate)   History   Foot Pain   HPI  Michele Matthews is a 77 y.o. female who presented to the emergency department today because of concerns for left foot pain.  She says that she had her left toes amputated a number of years ago but it never quite healed correctly.  However roughly 2 weeks ago she started noticing increasing pain and a wound to her left foot.  She denies any trauma when this started.  The pain has increased.  The patient denies any fevers or nausea or vomiting.      Physical Exam   Triage Vital Signs: ED Triage Vitals  Encounter Vitals Group     BP 03/21/23 1103 (!) 106/49     Systolic BP Percentile --      Diastolic BP Percentile --      Pulse Rate 03/21/23 1103 85     Resp 03/21/23 1103 18     Temp 03/21/23 1103 98 F (36.7 C)     Temp src --      SpO2 03/21/23 1103 100 %     Weight 03/21/23 1105 114 lb (51.7 kg)     Height 03/21/23 1105 5\' 5"  (1.651 m)     Head Circumference --      Peak Flow --      Pain Score 03/21/23 1104 10     Pain Loc --      Pain Education --      Exclude from Growth Chart --     Most recent vital signs: Vitals:   03/21/23 1103  BP: (!) 106/49  Pulse: 85  Resp: 18  Temp: 98 F (36.7 C)  SpO2: 100%   General: Awake, alert, oriented. CV:  Good peripheral perfusion. Regular rate and rhythm. Resp:  Normal effort. Lungs clear. Abd:  No distention.  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   ED Results / Procedures / Treatments   Labs (all labs ordered are listed, but only abnormal results are displayed) Labs Reviewed  CBC WITH DIFFERENTIAL/PLATELET - Abnormal; Notable for the following components:      Result Value   RBC 3.65 (*)    Hemoglobin 6.4 (*)    HCT 24.2 (*)    MCV 66.3 (*)    MCH 17.5 (*)    MCHC 26.4 (*)    RDW 22.0  (*)    Platelets 493 (*)    Neutro Abs 8.2 (*)    All other components within normal limits  BASIC METABOLIC PANEL - Abnormal; Notable for the following components:   Glucose, Bld 116 (*)    All other components within normal limits  LACTIC ACID, PLASMA - Abnormal; Notable for the following components:   Lactic Acid, Venous 2.1 (*)    All other components within normal limits  IRON AND TIBC - Abnormal; Notable for the following components:   Saturation Ratios 9 (*)    All other components within normal limits  FERRITIN - Abnormal; Notable for the following components:   Ferritin 6 (*)    All other components within normal limits  CBC - Abnormal; Notable for the following components:   WBC 11.5 (*)    Hemoglobin 8.3 (*)    HCT 28.8 (*)    MCV 67.6 (*)  MCH 19.5 (*)    MCHC 28.8 (*)    RDW 22.8 (*)    Platelets 441 (*)    All other components within normal limits  CULTURE, BLOOD (ROUTINE X 2)  CULTURE, BLOOD (ROUTINE X 2)  LACTIC ACID, PLASMA  BASIC METABOLIC PANEL  TYPE AND SCREEN  PREPARE RBC (CROSSMATCH)     EKG  None   RADIOLOGY I independently interpreted and visualized the left foot x-ray. My interpretation: No osseous erosion Radiology interpretation: Pending at time of admission    PROCEDURES:  Critical Care performed: Yes  CRITICAL CARE Performed by: Phineas Semen   Total critical care time: 30 minutes  Critical care time was exclusive of separately billable procedures and treating other patients.  Critical care was necessary to treat or prevent imminent or life-threatening deterioration.  Critical care was time spent personally by me on the following activities: development of treatment plan with patient and/or surrogate as well as nursing, discussions with consultants, evaluation of patient's response to treatment, examination of patient, obtaining history from patient or surrogate, ordering and performing treatments and interventions, ordering  and review of laboratory studies, ordering and review of radiographic studies, pulse oximetry and re-evaluation of patient's condition.   Procedures    MEDICATIONS ORDERED IN ED: Medications  fentaNYL (SUBLIMAZE) injection 50 mcg (has no administration in time range)     IMPRESSION / MDM / ASSESSMENT AND PLAN / ED COURSE  I reviewed the triage vital signs and the nursing notes.                              Differential diagnosis includes, but is not limited to, cellulitis, osteomyelitis  Patient's presentation is most consistent with acute presentation with potential threat to life or bodily function.   The patient is on the cardiac monitor to evaluate for evidence of arrhythmia and/or significant heart rate changes.  Patient presented to the emergency department today because of concerns for left foot pain.  On exam there is some swelling and a wound to the distal aspect of her left foot.  Will check blood work as well as x-ray to evaluate for possible osteomyelitis.  Blood work is concerning for elevated lactic acid level. Additionally blood work was notable for significant anemia. Patient states this has happened to her in the past. GUIAC was negative. Discussed and will transfuse blood. Additionally patient was started on IV antibiotics for infection, no clear signs of osteo on the x-ray per my read, pending official radiology read at time of admission.     FINAL CLINICAL IMPRESSION(S) / ED DIAGNOSES   Final diagnoses:  Foot pain, left  Cellulitis, unspecified cellulitis site  Anemia, unspecified type     Note:  This document was prepared using Dragon voice recognition software and may include unintentional dictation errors.    Phineas Semen, MD 03/22/23 [phone]

## 2023-03-21 NOTE — Consult Note (Signed)
Pharmacy Antibiotic Note  Michele Matthews is a 77 y.o. female admitted on 03/21/2023 with possible sepsis due to left foot cellulitis. Patient reports increasing pain to left foot for past 2 weeks. Pharmacy has been consulted for vancomycin dosing.  Today, 03/21/2023 WBC 9.8 Lactate 1.5  Afebrile  Scr 0.69 with estimated CrCl 47.3 mL/min  Received vancomycin 1 gm IV loading dose today  Blood cultures sent   Plan: Start vancomycin 750 mg IV Q24H tomorrow  Goal AUC 400-550 Estimated AUC 461.5 Estimated Cmin 11.4  Scr used 0.8 (actual 0.69), TBW,  Vd 0.72  Patient also receiving ceftriaxone 2 gm IV Q24H  Pharmacy will continue to monitor and dose adjust appropriately   Height: 5\' 5"  (165.1 cm) Weight: 51.7 kg (114 lb) IBW/kg (Calculated) : 57  Temp (24hrs), Avg:98.2 F (36.8 C), Min:98 F (36.7 C), Max:98.4 F (36.9 C)  Recent Labs  Lab 03/21/23 1121 03/21/23 1328  WBC 9.8  --   CREATININE 0.69  --   LATICACIDVEN 2.1* 1.5    Estimated Creatinine Clearance: 47.3 mL/min (by C-G formula based on SCr of 0.69 mg/dL).    No Known Allergies  Antimicrobials this admission: Vancomycin 11/19 >>  Dose adjustments this admission: N/A  Microbiology results: 11/19 BCx: sent   Thank you for allowing pharmacy to be a part of this patient's care.  Littie Deeds, PharmD Pharmacy Resident  03/21/2023 3:25 PM

## 2023-03-21 NOTE — H&P (Signed)
History and Physical    MCKINNEY GLASS NUU:[id number] DOB: July 22, 1945 DOA: 03/21/2023  PCP: Pcp, No  Patient coming from: Home  I have personally briefly reviewed patient's old medical records in [facility]  Chief Complaint: left foot pain  HPI: Michele Matthews is a 77 y.o. female with medical history significant of hypertension, combined systolic and diastolic heart failure, COPD, hyperlipidemia, tobacco dependence who presents to the ED with chief complaint of left foot pain.  Patient states that she underwent a transmetatarsal amputation several years ago but it never quite healed properly.  2 weeks prior to presentation to [facility] noted increasing pain and a wound to the left foot.  Pain is gradually increased.  No fevers chills.  No nausea or vomiting.  Patient also endorsed a rash on the right side of her face consistent with shingles outbreak.  She denies pain or pruritus from this rash but states that she knows its there and is compelled to touch it.  On my evaluation patient is resting in bed.  She is in no distress but reports improvement after administration of pain medication.  Vital signs are stable.  Laboratory investigation significant for anemia with hemoglobin 6.4.  Patient did have mildly elevated lactic acid which improved after administration of intravenous fluids.   ED Course: Patient was given intravenous fluids, pain medication, IV antibiotics after culture data was taken.  Hospitalist contacted to admit under working diagnosis of sepsis secondary to infection cellulitis of left foot.  Review of Systems: As per HPI otherwise 14 point review of systems negative.    Past Medical History:  Diagnosis Date   Allergy    COPD (chronic obstructive pulmonary disease) (HCC)    Coronary artery disease    Depression    Emphysema of lung (HCC)    GERD (gastroesophageal reflux disease)    Hyperlipidemia    Hypertension    Seizures (HCC)    Stroke [facility])     Past Surgical  History:  Procedure Laterality Date   COLONOSCOPY WITH PROPOFOL N/A 09/08/2021   Procedure: COLONOSCOPY WITH PROPOFOL;  Surgeon: Toledo, Boykin Nearing, MD;  Location: [facility] ENDOSCOPY;  Service: Gastroenterology;  Laterality: N/A;   ESOPHAGOGASTRODUODENOSCOPY N/A 08/10/2015   Procedure: ESOPHAGOGASTRODUODENOSCOPY (EGD);  Surgeon: Scot Jun, MD;  Location[facility] ENDOSCOPY;  Service: Endoscopy;  Laterality: N/A;   ESOPHAGOGASTRODUODENOSCOPY N/A 09/08/2021   Procedure: ESOPHAGOGASTRODUODENOSCOPY (EGD);  Surgeon: Toledo, Boykin Nearing, MD;  Location: [facility] ENDOSCOPY;  Service: Gastroenterology;  Laterality: N/A;   ESOPHAGOGASTRODUODENOSCOPY (EGD) WITH PROPOFOL N/A 12/11/2014   Procedure: ESOPHAGOGASTRODUODENOSCOPY (EGD) WITH PROPOFOL;  Surgeon: Midge Minium, MD;  Location: [facility] ENDOSCOPY;  Service: Endoscopy;  Laterality: N/A;   ESOPHAGOGASTRODUODENOSCOPY (EGD) WITH PROPOFOL N/A 12/29/2015   Procedure: ESOPHAGOGASTRODUODENOSCOPY (EGD) WITH PROPOFOL;  Surgeon: Napoleon Form, MD;  Location: [facility] ENDOSCOPY;  Service: Endoscopy;  Laterality: N/A;   FEMORAL-TIBIAL BYPASS GRAFT Left 06/09/2022   Procedure: BYPASS GRAFT FEMORAL-TIBIAL ARTERY;  Surgeon: Renford Dills, MD;  Location: [facility] ORS;  Service: Vascular;  Laterality: Left;   LOWER EXTREMITY ANGIOGRAPHY Left 11/25/2020   Procedure: Lower Extremity Angiography;  Surgeon: Annice Needy, MD;  Location: [facility] INVASIVE CV LAB;  Service: Cardiovascular;  Laterality: Left;   LOWER EXTREMITY ANGIOGRAPHY Left 11/30/2020   Procedure: Lower Extremity Angiography;  Surgeon: Annice Needy, MD;  Location: [facility] INVASIVE CV LAB;  Service: Cardiovascular;  Laterality: Left;   LOWER EXTREMITY ANGIOGRAPHY Left 05/31/2022   Procedure: Lower Extremity Angiography;  Surgeon: Renford Dills, MD;  Location: [facility] INVASIVE CV LAB;  Service: Cardiovascular;  Laterality: Left;   TRANSMETATARSAL AMPUTATION Left 12/02/2020   Procedure: TRANSMETATARSAL AMPUTATION;  Surgeon: Rosetta Posner,  DPM;  Location: [facility] ORS;  Service: Podiatry;  Laterality: Left;     reports that she has been smoking. She uses smokeless tobacco. She reports that she does not drink alcohol and does not use drugs.  No Known Allergies  Family History  Problem Relation Age of Onset   Alzheimer's disease Father    Lung cancer Sister    Breast cancer Sister    Cancer Mother      Prior to Admission medications   Medication Sig Start Date End Date Taking? Authorizing Provider  acetaminophen (TYLENOL) 500 MG tablet Take 2 tablets (1,000 mg total) by mouth 3 (three) times daily. 12/08/20   Danford, Earl Lites, MD  ascorbic acid (VITAMIN C) 250 MG tablet Take 1 tablet (250 mg total) by mouth 2 (two) times daily. Patient not taking: Reported on 05/26/2022 12/08/20   Alberteen Sam, MD  aspirin EC 81 MG tablet Take 1 tablet (81 mg total) by mouth daily. Swallow whole. 06/14/22 06/14/23  Gillis Santa, MD  atorvastatin (LIPITOR) 20 MG tablet Take 20 mg by mouth daily. 08/26/21   [provider]  busPIRone (BUSPAR) 7.5 MG tablet Take 1 tablet (7.5 mg total) by mouth 2 (two) times daily. 11/03/16   Karamalegos, Netta Neat, DO  carvedilol (COREG) 6.25 MG tablet Take 0.5 tablets (3.125 mg total) by mouth 2 (two) times daily with a meal. 06/13/22 09/11/22  Gillis Santa, MD  clopidogrel (PLAVIX) 75 MG tablet Take 75 mg by mouth daily. 11/16/20   [provider]  diphenhydramine-acetaminophen (TYLENOL PM) 25-500 MG TABS tablet Take 1 tablet by mouth at bedtime as needed.    [provider]  famotidine (PEPCID) 20 MG tablet Take 1 tablet (20 mg total) by mouth 2 (two) times daily as needed for heartburn or indigestion. 06/13/22 07/13/22  Gillis Santa, MD  feeding supplement, ENSURE ENLIVE, (ENSURE ENLIVE) LIQD Take 237 mLs by mouth 3 (three) times daily between meals. 07/27/17   Enid Baas, MD  ferrous sulfate 325 (65 FE) MG tablet Take 1 tablet (325 mg total) by mouth 2 (two) times daily  with a meal. Patient not taking: Reported on 05/26/2022 09/09/21   Enedina Finner, MD  furosemide (LASIX) 20 MG tablet Take 1 tablet (20 mg total) by mouth daily. 07/27/17   Enid Baas, MD  hydrOXYzine (ATARAX/VISTARIL) 10 MG tablet Take 1 tablet (10 mg total) by mouth 2 (two) times daily as needed for anxiety (sleep). 11/03/16   Smitty Cords, DO  Multiple Vitamin (MULTIVITAMIN) tablet Take 1 tablet by mouth daily.    [provider]  oxyCODONE (OXY IR/ROXICODONE) 5 MG immediate release tablet Take 1 tablet (5 mg total) by mouth every 6 (six) hours as needed for severe pain. 06/13/22   Gillis Santa, MD  senna-docusate (SENOKOT-S) 8.6-50 MG tablet Take 2 tablets by mouth 2 (two) times daily. 12/08/20   Danford, Earl Lites, MD  sertraline (ZOLOFT) 100 MG tablet Take 1 tablet (100 mg total) by mouth 2 (two) times daily. 07/15/16   Karamalegos, Netta Neat, DO  Tetrahydrozoline HCl (VISINE OP) Place 1 drop into both eyes 2 (two) times daily as needed (dry eyes).    [provider]    Physical Exam: Vitals:   03/21/23 1330 03/21/23 1345 03/21/23 1400 03/21/23 1500  BP: (!) 115/55  (!) 136/45 (!) 140/50  Pulse: 88 84 84 88  Resp:    17  Temp:    98.4 F (36.9 C)  TempSrc:    Oral  SpO2: 100% 100% 100% 100%  Weight:      Height:        Vitals:   03/21/23 1330 03/21/23 1345 03/21/23 1400 03/21/23 1500  BP: (!) 115/55  (!) 136/45 (!) 140/50  Pulse: 88 84 84 88  Resp:    17  Temp:    98.4 F (36.9 C)  TempSrc:    Oral  SpO2: 100% 100% 100% 100%  Weight:      Height:       General: NAD.  Appears frail HEENT: Normocephalic, atraumatic Neck, supple, trachea midline, no tenderness Heart: Distant heart sounds.  No murmurs.  No pedal edema Lungs: Lungs are diminished.  Scattered crackles.  Normal work of breathing.  Room air Abdomen: Thin, soft NT/ND, normal bowel sounds Extremities: Left status post TMA.  Wound noted on distal aspect Skin: Wound on distal  aspect of left foot stump Neurologic: Cranial nerves grossly intact, sensation intact, alert and oriented x3 Psychiatric: Normal affect   Labs on Admission: I have personally reviewed following labs and imaging studies  CBC: Recent Labs  Lab 03/21/23 1121  WBC 9.8  NEUTROABS 8.2*  HGB 6.4*  HCT 24.2*  MCV 66.3*  PLT 493*   Basic Metabolic Panel: Recent Labs  Lab 03/21/23 1121  NA 138  K 4.0  CL 104  CO2 24  GLUCOSE 116*  BUN 21  CREATININE 0.69  CALCIUM 9.3   GFR: Estimated Creatinine Clearance: 47.3 mL/min (by C-G formula based on SCr of 0.69 mg/dL). Liver Function Tests: No results for input(s): "AST", "ALT", "ALKPHOS", "BILITOT", "PROT", "ALBUMIN" in the last 168 hours. No results for input(s): "LIPASE", "AMYLASE" in the last 168 hours. No results for input(s): "AMMONIA" in the last 168 hours. Coagulation Profile: No results for input(s): "INR", "PROTIME" in the last 168 hours. Cardiac Enzymes: No results for input(s): "CKTOTAL", "CKMB", "CKMBINDEX", "TROPONINI" in the last 168 hours. BNP (last 3 results) No results for input(s): "PROBNP" in the last 8760 hours. HbA1C: No results for input(s): "HGBA1C" in the last 72 hours. CBG: No results for input(s): "GLUCAP" in the last 168 hours. Lipid Profile: No results for input(s): "CHOL", "HDL", "LDLCALC", "TRIG", "CHOLHDL", "LDLDIRECT" in the last 72 hours. Thyroid Function Tests: No results for input(s): "TSH", "T4TOTAL", "FREET4", "T3FREE", "THYROIDAB" in the last 72 hours. Anemia Panel: No results for input(s): "VITAMINB12", "FOLATE", "FERRITIN", "TIBC", "IRON", "RETICCTPCT" in the last 72 hours. Urine analysis:    Component Value Date/Time   COLORURINE AMBER (A) 09/05/2021 0954   APPEARANCEUR CLEAR (A) 09/05/2021 0954   APPEARANCEUR Hazy 03/27/2014 1845   LABSPEC 1.018 09/05/2021 0954   LABSPEC 1.028 03/27/2014 1845   PHURINE 6.0 09/05/2021 0954   GLUCOSEU NEGATIVE 09/05/2021 0954   GLUCOSEU Negative  03/27/2014 1845   HGBUR NEGATIVE 09/05/2021 0954   BILIRUBINUR NEGATIVE 09/05/2021 0954   BILIRUBINUR Negative 03/27/2014 1845   KETONESUR 5 (A) 09/05/2021 0954   PROTEINUR NEGATIVE 09/05/2021 0954   NITRITE POSITIVE (A) 09/05/2021 0954   LEUKOCYTESUR NEGATIVE 09/05/2021 0954   LEUKOCYTESUR 3+ 03/27/2014 1845    Radiological Exams on Admission: No results found.  EKG: Independently reviewed.  Sinus rhythm  Assessment/Plan Principal Problem:   Sepsis (HCC)  Possible sepsis secondary to left foot cellulitis Patient with elevated lactic acid though vital signs within normal limits and no white count Sepsis  unable to be excluded but does not meet SIRS criteria at this time Will treat for infection regardless Plan: Admit inpatient Sepsis bundle fluids Broad-spectrum IV antibiotics for cellulitis vancomycin and Rocephin Follow blood cultures Follow read of left foot x-ray If any evidence of bony destruction consider MRI and podiatry consult Multimodal pain control  Acute on chronic anemia History of iron deficiency anemia Hemoglobin on arrival 6.4.  Guaiac negative in ED.  No evidence of acute blood loss.  Vital signs stable.  Low MCV and high RDW indicative of iron deficiency anemia.  Patient carries this diagnosis but is not adherent to home iron therapies. Plan: Transfuse 1 unit PRBC Check iron panel Consider intravenous iron replacement pending results of iron panel  Facial zoster Present on right side of face.  Patient denies any visual disturbance. Plan: P.o. Valtrex  Chronic systolic diastolic congestive heart failure EF 25 to 30% in 2019.  Volume status appears euvolemic at this time.  Monitor weight.  Continue home diuretics.  COPD, unspecified type/severity Active smoker Patient started smoking at age 1.  Not ready to quit.  Will add as needed breathing treatments and offered nicotine patch.  Educate patient on smoking cessation.  Essential  hypertension Blood pressure controlled Continue home regimen  Generalized anxiety disorder Stable Medications resumed    DVT prophylaxis: SQ Lovenox Code Status: Full Family Communication: None at bedside Disposition Plan: Anticipate return to previous home environment Consults called: None at this time Admission status: Inpatient, MedSurg   Tresa Moore MD [facility]   If 7PM-7AM, please contact night-coverage   03/21/2023, 3:07 PM

## 2023-03-21 NOTE — ED Triage Notes (Signed)
Patient comes in from home via [facility] with complaints of left foot pain. According to Patient, she had her toes amputated a few years ago. Patient states that her foot has been hurting about 2 weeks. Pts complains of pain 10/10. Pt is having an active shingles outbreak on the right side of her face at this time. Pt is alert and oriented x4, with no signs of acute distress at this time.

## 2023-03-21 NOTE — ED Notes (Signed)
EDT called and spoke with the Secretary from 1A to inform them the pt is on the way. Secretary stated "Okay."

## 2023-03-22 ENCOUNTER — Inpatient Hospital Stay: Payer: Medicare HMO

## 2023-03-22 DIAGNOSIS — I70244 Atherosclerosis of native arteries of left leg with ulceration of heel and midfoot: Secondary | ICD-10-CM

## 2023-03-22 DIAGNOSIS — I998 Other disorder of circulatory system: Secondary | ICD-10-CM | POA: Diagnosis not present

## 2023-03-22 DIAGNOSIS — L97429 Non-pressure chronic ulcer of left heel and midfoot with unspecified severity: Secondary | ICD-10-CM | POA: Diagnosis not present

## 2023-03-22 DIAGNOSIS — Z7982 Long term (current) use of aspirin: Secondary | ICD-10-CM

## 2023-03-22 DIAGNOSIS — L97529 Non-pressure chronic ulcer of other part of left foot with unspecified severity: Secondary | ICD-10-CM

## 2023-03-22 DIAGNOSIS — M79672 Pain in left foot: Secondary | ICD-10-CM | POA: Diagnosis not present

## 2023-03-22 DIAGNOSIS — L0390 Cellulitis, unspecified: Secondary | ICD-10-CM

## 2023-03-22 DIAGNOSIS — I70245 Atherosclerosis of native arteries of left leg with ulceration of other part of foot: Secondary | ICD-10-CM

## 2023-03-22 DIAGNOSIS — F17210 Nicotine dependence, cigarettes, uncomplicated: Secondary | ICD-10-CM

## 2023-03-22 DIAGNOSIS — Z7902 Long term (current) use of antithrombotics/antiplatelets: Secondary | ICD-10-CM

## 2023-03-22 DIAGNOSIS — Z79899 Other long term (current) drug therapy: Secondary | ICD-10-CM

## 2023-03-22 DIAGNOSIS — Z95828 Presence of other vascular implants and grafts: Secondary | ICD-10-CM

## 2023-03-22 DIAGNOSIS — Z9889 Other specified postprocedural states: Secondary | ICD-10-CM

## 2023-03-22 LAB — CBC
HCT: 28.8 % — ABNORMAL LOW (ref 36.0–46.0)
Hemoglobin: 8.3 g/dL — ABNORMAL LOW (ref 12.0–15.0)
MCH: 19.5 pg — ABNORMAL LOW (ref 26.0–34.0)
MCHC: 28.8 g/dL — ABNORMAL LOW (ref 30.0–36.0)
MCV: 67.6 fL — ABNORMAL LOW (ref 80.0–100.0)
Platelets: 441 K/uL — ABNORMAL HIGH (ref 150–400)
RBC: 4.26 MIL/uL (ref 3.87–5.11)
RDW: 22.8 % — ABNORMAL HIGH (ref 11.5–15.5)
WBC: 11.5 K/uL — ABNORMAL HIGH (ref 4.0–10.5)
nRBC: 0 % (ref 0.0–0.2)

## 2023-03-22 LAB — BASIC METABOLIC PANEL WITH GFR
Anion gap: 6 (ref 5–15)
BUN: 17 mg/dL (ref 8–23)
CO2: 24 mmol/L (ref 22–32)
Calcium: 9.4 mg/dL (ref 8.9–10.3)
Chloride: 105 mmol/L (ref 98–111)
Creatinine, Ser: 0.6 mg/dL (ref 0.44–1.00)
GFR, Estimated: >60 mL/min
Glucose, Bld: 99 mg/dL (ref 70–99)
Potassium: 3.9 mmol/L (ref 3.5–5.1)
Sodium: 135 mmol/L (ref 135–145)

## 2023-03-22 MED ORDER — OXYCODONE HCL 5 MG PO TABS
5.0000 mg | ORAL_TABLET | Freq: Once | ORAL | Status: AC
  Administered 2023-03-22: 5 mg via ORAL
  Filled 2023-03-22: qty 1

## 2023-03-22 MED ORDER — ENOXAPARIN SODIUM 40 MG/0.4ML IJ SOSY
40.0000 mg | PREFILLED_SYRINGE | INTRAMUSCULAR | Status: DC
  Administered 2023-03-22 – 2023-03-23 (×2): 40 mg via SUBCUTANEOUS
  Filled 2023-03-22 (×2): qty 0.4

## 2023-03-22 MED ORDER — MORPHINE SULFATE (PF) 2 MG/ML IV SOLN
2.0000 mg | INTRAVENOUS | Status: DC | PRN
  Administered 2023-03-22: 2 mg via INTRAVENOUS
  Filled 2023-03-22: qty 1

## 2023-03-22 MED ORDER — CARVEDILOL 3.125 MG PO TABS
6.2500 mg | ORAL_TABLET | Freq: Two times a day (BID) | ORAL | Status: DC
  Administered 2023-03-22 – 2023-04-03 (×19): 6.25 mg via ORAL
  Filled 2023-03-22 (×20): qty 2

## 2023-03-22 MED ORDER — CARVEDILOL 3.125 MG PO TABS
3.1250 mg | ORAL_TABLET | Freq: Once | ORAL | Status: AC
  Administered 2023-03-22: 3.125 mg via ORAL
  Filled 2023-03-22: qty 1

## 2023-03-22 MED ORDER — GABAPENTIN 100 MG PO CAPS
100.0000 mg | ORAL_CAPSULE | Freq: Three times a day (TID) | ORAL | Status: DC
  Administered 2023-03-22 – 2023-04-03 (×35): 100 mg via ORAL
  Filled 2023-03-22 (×35): qty 1

## 2023-03-22 MED ORDER — LOSARTAN POTASSIUM 25 MG PO TABS
25.0000 mg | ORAL_TABLET | Freq: Every day | ORAL | Status: DC
  Administered 2023-03-22 – 2023-03-31 (×8): 25 mg via ORAL
  Filled 2023-03-22 (×9): qty 1

## 2023-03-22 MED ORDER — TRAZODONE HCL 50 MG PO TABS
50.0000 mg | ORAL_TABLET | Freq: Once | ORAL | Status: AC
  Administered 2023-03-22: 50 mg via ORAL
  Filled 2023-03-22: qty 1

## 2023-03-22 NOTE — Progress Notes (Signed)
       CROSS COVER NOTE  NAME: Michele Matthews MRN: [id number] DOB : Oct 21, 1945    Concern as stated by nurse / staff   Message received from nurse  medical history significant of hypertension, combined systolic and diastolic heart failure, COPD, hyperlipidemia, tobacco dependence who presents to the ED with chief complaint of left foot pain. Please change her pain med from morphine to something else. She had 5 mg of oxycodone order, but it didn't help. It was discontinued. In report I was told the morphine caused her to become confused with constant calling out. She did not know who she was, or her husband. She thought she was in a hall and needed to get to her room, but she was already in her room. She was asking for food and the plate was in front of her. Thanks    Pertinent findings on chart review:   Assessment and  Interventions   Assessment: Today's Vitals   03/22/23 1129 03/22/23 1159 03/22/23 1610 03/22/23 2012  BP:   128/71 98/66  Pulse:   (!) 46 (!) 103  Resp:   16 16  Temp:   98.2 F (36.8 C) 97.9 F (36.6 C)  TempSrc:    Oral  SpO2:   (!) 79% 100%  Weight:      Height:      PainSc: 10-Worst pain ever 7      Body mass index is 18.97 kg/m.  Plan: Oxycodone 5 mg x1 monitor response and adjust dose Discontinue morphine     Donnie Mesa NP [facility] Cross Cover 7pm-7am - check amion for availability Pager [phone]

## 2023-03-22 NOTE — Evaluation (Signed)
Physical Therapy Evaluation Patient Details Name: Michele Matthews MRN: [id number] DOB: [birth year] Today's Date: 03/22/2023  History of Present Illness  Pt is a 77 y.o. female with medical history significant of HTN, combined systolic and diastolic heart failure, COPD, HLD, tobacco dependence, presenting with c/c of L foot pain, previous L transmet amputation (2022). MD assessment includes PAD, L foot cellulitis with heel ulcer, acute on chronic anemia, facial zoster, anxiety disorder, depression, and CKD.  Clinical Impression  Pt presented in bed A,O x4 with immediate report of severe L foot pain. Pt was willing to answer subjective questions and perform bed mobility to reposition after receiving heavy encouragement. Pt is very HOH, resulting in limited subjective history collected. Pt perseverated on pain throughout session and vehemently refused to attempt transfers or gait. Pt was able to perform bed mobility with +2 mod assist to achieve safely and minimize pain. Pt was educated on importance of L foot positioning to decrease pain and improve wound healing. Pt will benefit from continued PT services upon discharge to safely address deficits listed in patient problem list for decreased caregiver assistance and eventual return to PLOF.       If plan is discharge home, recommend the following: A lot of help with walking and/or transfers;A lot of help with bathing/dressing/bathroom;Assistance with cooking/housework;Assist for transportation;Help with stairs or ramp for entrance   Can travel by private vehicle   No    Equipment Recommendations    Recommendations for Other Services       Functional Status Assessment Patient has had a recent decline in their functional status and demonstrates the ability to make significant improvements in function in a reasonable and predictable amount of time.     Precautions / Restrictions Precautions Precautions: Fall Restrictions Weight Bearing  Restrictions: No      Mobility  Bed Mobility Overal bed mobility: Needs Assistance Bed Mobility: Rolling Rolling: Min assist, Mod assist, +2 for physical assistance, Used rails         General bed mobility comments: able to perform multiple bilat rolls while changing chuck pad with assist at trunk and BLEs, heavy cuing for hand placement/rail use and use of BLEs, notable weakness throughout, decreased willingness to perform 2/2 severe pain    Transfers                   General transfer comment: pt declined    Ambulation/Gait                  Stairs            Wheelchair Mobility     Tilt Bed    Modified Rankin (Stroke Patients Only)       Balance Overall balance assessment:  (pt declined to sit EOB)                                           Pertinent Vitals/Pain Pain Assessment Pain Assessment: 0-10 Pain Score: 9  Pain Location: L foot, heel and forefoot Pain Descriptors / Indicators: Constant, Grimacing, Tender, Moaning Pain Intervention(s): Monitored during session, Repositioned, Other (comment)    Home Living Family/patient expects to be discharged to:: Private residence Living Arrangements: Spouse/significant other Available Help at Discharge: Family Type of Home: House Home Access: Stairs to enter Entrance Stairs-Rails: Right Entrance Stairs-Number of Steps: 3   Home Layout: One level Home Equipment: Rolling  Walker (2 wheels);Rollator (4 wheels);Tub bench;Wheelchair - manual;Cane - single point;Shower seat;BSC/3in1 Additional Comments: received per combination of pt report and chart review    Prior Function Prior Level of Function : Needs assist       Physical Assist : ADLs (physical)   ADLs (physical): Bathing Mobility Comments: reported Mod I household ambulator with rollator; unable to gather fall hx ADLs Comments: reported that husband assists with bathing and other ADLs prn, ind with dressing      Extremity/Trunk Assessment   Upper Extremity Assessment Upper Extremity Assessment: Defer to OT evaluation    Lower Extremity Assessment Lower Extremity Assessment: Generalized weakness;LLE deficits/detail LLE Deficits / Details: wounds at L heel and forefoot       Communication   Communication Communication: Hearing impairment;Other (comment) (HOH, no hearing aid but reported need for one) Cueing Techniques: Verbal cues;Visual cues  Cognition Arousal: Alert Behavior During Therapy: WFL for tasks assessed/performed, Agitated Overall Cognitive Status: Within Functional Limits for tasks assessed                                          General Comments      Exercises Other Exercises Other Exercises: educated pt on importance of positioning to elevate L heel to decrease pain/promote healing, educated pt on benefits of mobility for improved wound healing Other Exercises: able to observe bilat SLR x2 with small AROM while repositioning pillows in supine   Assessment/Plan    PT Assessment Patient needs continued PT services  PT Problem List Decreased strength;Decreased range of motion;Decreased activity tolerance;Decreased balance;Decreased mobility;Decreased knowledge of use of DME;Decreased coordination;Pain;Impaired sensation;Decreased skin integrity       PT Treatment Interventions DME instruction;Balance training;Gait training;Stair training;Functional mobility training;Therapeutic activities;Therapeutic exercise;Patient/family education;Neuromuscular re-education    PT Goals (Current goals can be found in the Care Plan section)  Acute Rehab PT Goals Patient Stated Goal: decrease pain, return to PLOF PT Goal Formulation: With patient Time For Goal Achievement: 04/04/23 Potential to Achieve Goals: Fair    Frequency Min 1X/week     Co-evaluation               AM-PAC PT "6 Clicks" Mobility  Outcome Measure Help needed turning from your  back to your side while in a flat bed without using bedrails?: A Lot Help needed moving from lying on your back to sitting on the side of a flat bed without using bedrails?: A Lot Help needed moving to and from a bed to a chair (including a wheelchair)?: A Lot Help needed standing up from a chair using your arms (e.g., wheelchair or bedside chair)?: A Lot Help needed to walk in hospital room?: A Lot Help needed climbing 3-5 steps with a railing? : Total 6 Click Score: 11    End of Session Equipment Utilized During Treatment: Gait belt Activity Tolerance: Patient limited by pain Patient left: in bed;with bed alarm set;with call bell/phone within reach Nurse Communication: Mobility status PT Visit Diagnosis: Difficulty in walking, not elsewhere classified (R26.2);Pain;Muscle weakness (generalized) (M62.81) Pain - Right/Left: Left Pain - part of body: Ankle and joints of foot    Time: 9629-5284 PT Time Calculation (min) (ACUTE ONLY): 47 min   Charges:               Rosiland Oz SPT 03/22/23, 1:49 PM

## 2023-03-22 NOTE — Progress Notes (Signed)
OT Cancellation Note  Patient Details Name: NAZARIAH GRACIE MRN: [id number] DOB: 05/16/1945   Cancelled Treatment:    Reason Eval/Treat Not Completed: Patient at procedure or test/ unavailable. Order received, chart reviewed. Attempted x2, this AM pt severely pain limited, second attempt on arrival transport with pt for imaging. Will re-attempt as able.   Kathie Dike, M.S. OTR/L  03/22/23, 1:46 PM  ascom [phone]

## 2023-03-22 NOTE — Progress Notes (Signed)
Unable to administer IV abx, no IV access. IV Team at bedside.

## 2023-03-22 NOTE — Progress Notes (Signed)
Progress Note   [name] OZH:[id number] DOB: 11-Aug-1945 DOA: 03/21/2023     1 DOS: the patient was seen and examined on 03/22/2023   Brief hospital course:  Michele Matthews is a 77 y.o. female with medical history significant of hypertension, combined systolic and diastolic heart failure, COPD, hyperlipidemia, tobacco dependence who presents to the ED with chief complaint of left foot pain.  Patient states that she underwent a transmetatarsal amputation several years ago but it never quite healed properly.  2 weeks prior to presentation to [facility] noted increasing pain and a wound to the left foot.  Pain is gradually increased.  No fevers chills.  No nausea or vomiting.  Patient also endorsed a rash on the right side of her face consistent with shingles outbreak.  She denies pain or pruritus from this rash but states that she knows its there and is compelled to touch it.   On my evaluation patient is resting in bed.  She is in no distress but reports improvement after administration of pain medication.  Vital signs are stable.  Laboratory investigation significant for anemia with hemoglobin 6.4.  Patient did have mildly elevated lactic acid which improved after administration of intravenous fluids.     ED Course: Patient was given intravenous fluids, pain medication, IV antibiotics after culture data was taken.  Hospitalist contacted to admit under working diagnosis of sepsis secondary to infection cellulitis of left foot.     Assessment and Plan:   Left foot rest pain Known history of PAD Status post left TMA Patient presents to the ER for evaluation of worsening left foot pain S/P LLE angiogram which was done 02/24,  Diffuse atherosclerotic changes of the left lower extremity, occlusion of the SFA stents as well as the SFA and the popliteal in its entirety.  There appears to be anterior tibial runoff however the this does not appear to cross the ankle. Patient continues to  smoke Continue aspirin, Plavix and statin Pain control Will consult vascular surgery and podiatry    Left foot pain Concern for left foot cellulitis on admission Left heel ulcer Remains on empiric antibiotic therapy with vancomycin and Rocephin Left foot x-ray shows a new lucency within the soft tissues distal and distal lateral to the fifth metatarsal stump and distal to the second metatarsal stump. No definitive new adjacent cortical erosion is seen. Re demonstration of ulceration of the distal posterior heel. No definitive cortical erosion is seen within the adjacent distal posterior calcaneus to indicate radiographic evidence of acute osteomyelitis. Will consult podiatry     Acute on chronic anemia History of iron deficiency anemia Hemoglobin on arrival 6.4.  Guaiac negative in ED.  No evidence of acute blood loss.  Vital signs stable.   Low MCV and high RDW indicative of iron deficiency anemia.  Patient carries this diagnosis but is not adherent to home iron therapies. Patient has been transfused 1 unit of packed RBC with improvement in her H&H Monitor closely during this hospitalization     Facial zoster Present on right side of face.   Patient denies any visual disturbance. Rash in different stages of crusting Continue Valtrex    Chronic systolic diastolic congestive heart failure LVEF 40 to 45% Appears euvolemic at this time. Continue carvedilol and furosemide Will add Cozaar    COPD, unspecified type/severity Active smoker Patient started smoking at age 35.  Not ready to quit.  ' Smoking cessation was discussed with patient in detail Continue nicotine  transdermal patch     Essential hypertension Blood pressure uncontrolled Increase dose of carvedilol Add Cozaar    Generalized anxiety disorder Depression Stable Continue sertraline and trazodone    Stage IIIa chronic kidney disease Stable Monitor renal function closely          Subjective:  Continues to complain of severe left foot pain.  Requesting morphine for pain control  Physical Exam: Vitals:   03/21/23 1723 03/21/23 1750 03/22/23 0027 03/22/23 0810  BP: (!) 152/68 (!) 153/72 (!) 151/54 (!) 161/95  Pulse: 96 95 99 60  Resp: (!) 21 15 18 15   Temp: 98.3 F (36.8 C) 98.1 F (36.7 C) 98.3 F (36.8 C) 97.9 F (36.6 C)  TempSrc: Oral Oral    SpO2: 92% 100% 96%   Weight:      Height:       General: NAD.  Appears frail, chronically ill-appearing HEENT: Normocephalic, atraumatic Neck, supple, trachea midline, no tenderness Heart: Distant heart sounds.  No murmurs.  No pedal edema Lungs: Lungs are diminished.  Scattered crackles.  Normal work of breathing.  Room air Abdomen: Thin, soft NT/ND, normal bowel sounds Extremities: Left status post TMA.  Wound noted on distal aspect Skin: Wound on distal aspect of left foot stump Neurologic: Cranial nerves grossly intact, sensation intact, alert and oriented x3 Psychiatric: Normal affect   Data Reviewed: Labs reviewed.  Repeat hemoglobin 8.3 from 6.4 There are no new results to review at this time.  Family Communication: Plan of care discussed with patient in detail.  She verbalizes understanding and agrees with the plan.  Disposition: Status is: Inpatient Remains inpatient appropriate because: Needs vascular surgery and podiatry evaluation  Planned Discharge Destination:  TBD    Time spent: 36 minutes  Author: Lucile Shutters, MD 03/22/2023 12:15 PM  For on call review [email or web address].

## 2023-03-22 NOTE — Progress Notes (Signed)
Unable to obtain consent for angiogram as patient has been extremely confused after administration of morphine. Unable to reach husband by phone. Patient insists she has been "sitting in hall all day" and has called out multiple times for "someone to put me back in my room". Consent has been filled out and placed on front of chart, will pass on to oncoming nurse to obtain consent or request am nurse obtain consent or phone consent.

## 2023-03-22 NOTE — Plan of Care (Signed)
  Problem: Fluid Volume: Goal: Hemodynamic stability will improve Outcome: Progressing   

## 2023-03-22 NOTE — Consult Note (Addendum)
Pharmacy Antibiotic Note  Michele Matthews is a 77 y.o. female admitted on 03/21/2023 with possible sepsis due to left foot cellulitis. Patient reports increasing pain to left foot for past 2 weeks. Pharmacy has been consulted for vancomycin dosing.  Renal function stable and at baseline. Day 2 of antibiotics.   Plan: Continue vancomycin IV 750 mg every 24 hours Goal AUC 400-550 Estimated AUC 461.5 Estimated Cmin 11.4  Scr used 0.8 (actual 0.69), TBW,  Vd 0.72  Patient also receiving ceftriaxone 2 gm IV Q24H  Pharmacy will continue to monitor and dose adjust appropriately   Height: 5\' 5"  (165.1 cm) Weight: 51.7 kg (114 lb) IBW/kg (Calculated) : 57  Temp (24hrs), Avg:98.2 F (36.8 C), Min:98 F (36.7 C), Max:98.4 F (36.9 C)  Recent Labs  Lab 03/21/23 1121 03/21/23 1328 03/22/23 0432  WBC 9.8  --  11.5*  CREATININE 0.69  --  0.60  LATICACIDVEN 2.1* 1.5  --     Estimated Creatinine Clearance: 47.3 mL/min (by C-G formula based on SCr of 0.6 mg/dL).    No Known Allergies  Antimicrobials this admission: Vancomycin 11/19 >>  Dose adjustments this admission: N/A  Microbiology results: 11/19 BCx: NG < 24 hours  Thank you for allowing pharmacy to be a part of this patient's care.  Elliot Gurney, PharmD, BCPS Clinical Pharmacist  03/22/2023 8:20 AM

## 2023-03-22 NOTE — Progress Notes (Signed)
Patient taken to US.

## 2023-03-22 NOTE — Plan of Care (Signed)
  Problem: Education: Goal: Knowledge of General Education information will improve Description: Including pain rating scale, medication(s)/side effects and non-pharmacologic comfort measures Outcome: Progressing   Problem: Activity: Goal: Risk for activity intolerance will decrease Outcome: Progressing   Problem: Coping: Goal: Level of anxiety will decrease Outcome: Progressing   Problem: Elimination: Goal: Will not experience complications related to bowel motility Outcome: Progressing Goal: Will not experience complications related to urinary retention Outcome: Progressing   Problem: Pain Management: Goal: General experience of comfort will improve Outcome: Progressing   Problem: Safety: Goal: Ability to remain free from injury will improve Outcome: Progressing   Problem: Skin Integrity: Goal: Risk for impaired skin integrity will decrease Outcome: Progressing

## 2023-03-22 NOTE — Consult Note (Signed)
Hospital Consult    Reason for Consult:  Left Lower Extremity Pain Requesting Physician:  Dr Lolita Patella MD MRN #:  [id number]  History of Present Illness: This is a 77 y.o. female with medical history significant of hypertension, combined systolic and diastolic heart failure, COPD, hyperlipidemia, tobacco dependence who presents to the ED with chief complaint of left foot pain.  Patient states that she underwent a transmetatarsal amputation several years ago but it never quite healed properly.  2 weeks prior to presentation to [facility] noted increasing pain and a wound to the left foot.  Pain is gradually increased.   On exam this afternoon patient is resting comfortably in bed with her son at the bedside.  Patient endorses pain to her foot.  Patient endorses having a bypass surgery back in February to help her foot heal but it is never healed since then.  No other complaints today and her vitals all remained stable.  Vascular surgery consulted to evaluate.  Past Medical History:  Diagnosis Date   Allergy    COPD (chronic obstructive pulmonary disease) (HCC)    Coronary artery disease    Depression    Emphysema of lung (HCC)    GERD (gastroesophageal reflux disease)    Hyperlipidemia    Hypertension    Seizures (HCC)    Stroke [facility])     Past Surgical History:  Procedure Laterality Date   COLONOSCOPY WITH PROPOFOL N/A 09/08/2021   Procedure: COLONOSCOPY WITH PROPOFOL;  Surgeon: Toledo, Boykin Nearing, MD;  Location: [facility] ENDOSCOPY;  Service: Gastroenterology;  Laterality: N/A;   ESOPHAGOGASTRODUODENOSCOPY N/A 08/10/2015   Procedure: ESOPHAGOGASTRODUODENOSCOPY (EGD);  Surgeon: Scot Jun, MD;  Location: [facility] ENDOSCOPY;  Service: Endoscopy;  Laterality: N/A;   ESOPHAGOGASTRODUODENOSCOPY N/A 09/08/2021   Procedure: ESOPHAGOGASTRODUODENOSCOPY (EGD);  Surgeon: Toledo, Boykin Nearing, MD;  Location: [facility] ENDOSCOPY;  Service: Gastroenterology;  Laterality: N/A;   ESOPHAGOGASTRODUODENOSCOPY  (EGD) WITH PROPOFOL N/A 12/11/2014   Procedure: ESOPHAGOGASTRODUODENOSCOPY (EGD) WITH PROPOFOL;  Surgeon: Midge Minium, MD;  Location: [facility] ENDOSCOPY;  Service: Endoscopy;  Laterality: N/A;   ESOPHAGOGASTRODUODENOSCOPY (EGD) WITH PROPOFOL N/A 12/29/2015   Procedure: ESOPHAGOGASTRODUODENOSCOPY (EGD) WITH PROPOFOL;  Surgeon: Napoleon Form, MD;  Location: [facility] ENDOSCOPY;  Service: Endoscopy;  Laterality: N/A;   FEMORAL-TIBIAL BYPASS GRAFT Left 06/09/2022   Procedure: BYPASS GRAFT FEMORAL-TIBIAL ARTERY;  Surgeon: Renford Dills, MD;  Location: [facility] ORS;  Service: Vascular;  Laterality: Left;   LOWER EXTREMITY ANGIOGRAPHY Left 11/25/2020   Procedure: Lower Extremity Angiography;  Surgeon: Annice Needy, MD;  Location: [facility] INVASIVE CV LAB;  Service: Cardiovascular;  Laterality: Left;   LOWER EXTREMITY ANGIOGRAPHY Left 11/30/2020   Procedure: Lower Extremity Angiography;  Surgeon: Annice Needy, MD;  Location: [facility] INVASIVE CV LAB;  Service: Cardiovascular;  Laterality: Left;   LOWER EXTREMITY ANGIOGRAPHY Left 05/31/2022   Procedure: Lower Extremity Angiography;  Surgeon: Renford Dills, MD;  Location: [facility] INVASIVE CV LAB;  Service: Cardiovascular;  Laterality: Left;   TRANSMETATARSAL AMPUTATION Left 12/02/2020   Procedure: TRANSMETATARSAL AMPUTATION;  Surgeon: Rosetta Posner, DPM;  Location: [facility] ORS;  Service: Podiatry;  Laterality: Left;    No Known Allergies  Prior to Admission medications   Medication Sig Start Date End Date Taking? Authorizing Provider  acetaminophen (TYLENOL) 500 MG tablet Take 2 tablets (1,000 mg total) by mouth 3 (three) times daily. 12/08/20  Yes Danford, Earl Lites, MD  ascorbic acid (VITAMIN C) 250 MG tablet Take 1 tablet (250 mg total) by mouth 2 (two) times daily. Patient  not taking: Reported on 05/26/2022 12/08/20   Alberteen Sam, MD  aspirin EC 81 MG tablet Take 1 tablet (81 mg total) by mouth daily. Swallow whole. 06/14/22 06/14/23  Gillis Santa, MD   atorvastatin (LIPITOR) 20 MG tablet Take 20 mg by mouth daily. 08/26/21   [provider]  busPIRone (BUSPAR) 7.5 MG tablet Take 1 tablet (7.5 mg total) by mouth 2 (two) times daily. 11/03/16   Karamalegos, Netta Neat, DO  carvedilol (COREG) 6.25 MG tablet Take 0.5 tablets (3.125 mg total) by mouth 2 (two) times daily with a meal. 06/13/22 09/11/22  Gillis Santa, MD  clopidogrel (PLAVIX) 75 MG tablet Take 75 mg by mouth daily. 11/16/20   [provider]  diphenhydramine-acetaminophen (TYLENOL PM) 25-500 MG TABS tablet Take 1 tablet by mouth at bedtime as needed.    [provider]  famotidine (PEPCID) 20 MG tablet Take 1 tablet (20 mg total) by mouth 2 (two) times daily as needed for heartburn or indigestion. 06/13/22 07/13/22  Gillis Santa, MD  feeding supplement, ENSURE ENLIVE, (ENSURE ENLIVE) LIQD Take 237 mLs by mouth 3 (three) times daily between meals. 07/27/17   Enid Baas, MD  ferrous sulfate 325 (65 FE) MG tablet Take 1 tablet (325 mg total) by mouth 2 (two) times daily with a meal. Patient not taking: Reported on 05/26/2022 09/09/21   Enedina Finner, MD  furosemide (LASIX) 20 MG tablet Take 1 tablet (20 mg total) by mouth daily. 07/27/17   Enid Baas, MD  hydrOXYzine (ATARAX/VISTARIL) 10 MG tablet Take 1 tablet (10 mg total) by mouth 2 (two) times daily as needed for anxiety (sleep). 11/03/16   Smitty Cords, DO  Multiple Vitamin (MULTIVITAMIN) tablet Take 1 tablet by mouth daily.    [provider]  oxyCODONE (OXY IR/ROXICODONE) 5 MG immediate release tablet Take 1 tablet (5 mg total) by mouth every 6 (six) hours as needed for severe pain. 06/13/22   Gillis Santa, MD  senna-docusate (SENOKOT-S) 8.6-50 MG tablet Take 2 tablets by mouth 2 (two) times daily. 12/08/20   Danford, Earl Lites, MD  sertraline (ZOLOFT) 100 MG tablet Take 1 tablet (100 mg total) by mouth 2 (two) times daily. 07/15/16   Karamalegos, Netta Neat, DO  Tetrahydrozoline  HCl (VISINE OP) Place 1 drop into both eyes 2 (two) times daily as needed (dry eyes).    [provider]    Social History   Socioeconomic History   Marital status: Married    Spouse name: Not on file   Number of children: Not on file   Years of education: Not on file   Highest education level: Not on file  Occupational History   Occupation: retired  Tobacco Use   Smoking status: Every Day    Current packs/day: 1.00    Types: Cigarettes   Smokeless tobacco: Current  Vaping Use   Vaping status: Never Used  Substance and Sexual Activity   Alcohol use: No   Drug use: No   Sexual activity: Not Currently  Other Topics Concern   Not on file  Social History Narrative   Not on file   Social Determinants of Health   Financial Resource Strain: Medium Risk (11/08/2021)   Overall Financial Resource Strain (CARDIA)    Difficulty of Paying Living Expenses: Somewhat hard  Food Insecurity: No Food Insecurity (03/21/2023)   Hunger Vital Sign    Worried About Running Out of Food in the Last Year: Never true    Ran Out of  Food in the Last Year: Never true  Transportation Needs: No Transportation Needs (03/21/2023)   PRAPARE - Administrator, Civil Service (Medical): No    Lack of Transportation (Non-Medical): No  Physical Activity: Not on file  Stress: Not on file  Social Connections: Not on file  Intimate Partner Violence: Not At Risk (03/21/2023)   Humiliation, Afraid, Rape, and Kick questionnaire    Fear of Current or Ex-Partner: No    Emotionally Abused: No    Physically Abused: No    Sexually Abused: No     Family History  Problem Relation Age of Onset   Alzheimer's disease Father    Lung cancer Sister    Breast cancer Sister    Cancer Mother     ROS: Otherwise negative unless mentioned in HPI  Physical Examination  Vitals:   03/22/23 0027 03/22/23 0810  BP: (!) 151/54 (!) 161/95  Pulse: 99 60  Resp: 18 15  Temp: 98.3 F (36.8 C) 97.9 F  (36.6 C)  SpO2: 96%    Body mass index is 18.97 kg/m.  General:  WDWN in NAD Gait: Not observed HENT: WNL, normocephalic Pulmonary: normal non-labored breathing, without Rales, rhonchi,  wheezing Cardiac: regular, without  Murmurs, rubs or gallops; without carotid bruits Abdomen: Positive bowel sounds,  soft, NT/ND, no masses Skin: without rashes Vascular Exam/Pulses: Unable to palpate Left Popliteal, Dorsalis Pedis, and post tibial pulses. Open and draining transmetatarsal incision and heel.  Extremities: with ischemic changes, without Gangrene , with cellulitis; with open wounds;  Musculoskeletal: no muscle wasting or atrophy  Neurologic: A&O X 3;  No focal weakness or paresthesias are detected; speech is fluent/normal Psychiatric:  The pt has Normal affect. Lymph:  Unremarkable  CBC    Component Value Date/Time   WBC 11.5 (H) 03/22/2023 0432   RBC 4.26 03/22/2023 0432   HGB 8.3 (L) 03/22/2023 0432   HGB 12.1 03/27/2014 1730   HCT 28.8 (L) 03/22/2023 0432   HCT 37.0 03/27/2014 1730   PLT 441 (H) 03/22/2023 0432   PLT 175 03/27/2014 1730   MCV 67.6 (L) 03/22/2023 0432   MCV 97 03/27/2014 1730   MCH 19.5 (L) 03/22/2023 0432   MCHC 28.8 (L) 03/22/2023 0432   RDW 22.8 (H) 03/22/2023 0432   RDW 13.3 03/27/2014 1730   LYMPHSABS 0.8 03/21/2023 1121   LYMPHSABS 1.9 11/25/2013 0401   MONOABS 0.7 03/21/2023 1121   MONOABS 0.7 11/25/2013 0401   EOSABS 0.1 03/21/2023 1121   EOSABS 0.2 11/25/2013 0401   BASOSABS 0.0 03/21/2023 1121   BASOSABS 0.0 11/25/2013 0401    BMET    Component Value Date/Time   NA 135 03/22/2023 0432   NA 140 03/28/2014 0441   K 3.9 03/22/2023 0432   K 4.0 03/28/2014 0441   CL 105 03/22/2023 0432   CL 110 (H) 03/28/2014 0441   CO2 24 03/22/2023 0432   CO2 24 03/28/2014 0441   GLUCOSE 99 03/22/2023 0432   GLUCOSE 86 03/28/2014 0441   BUN 17 03/22/2023 0432   BUN 19 (H) 03/28/2014 0441   CREATININE 0.60 03/22/2023 0432   CREATININE 0.79  11/24/2015 0908   CALCIUM 9.4 03/22/2023 0432   CALCIUM 8.7 03/28/2014 0441   GFRNONAA >60 03/22/2023 0432   GFRNONAA 76 11/24/2015 0908   GFRAA >60 07/28/2017 0507   GFRAA 87 11/24/2015 0908    COAGS: Lab Results  Component Value Date   INR 1.0 05/26/2022   INR 1.2 09/05/2021  INR 1.0 11/25/2020     Non-Invasive Vascular Imaging:   None Ordered  Statin:  Yes.   Beta Blocker:  No. Aspirin:  Yes.   ACEI:  No. ARB:  No. CCB use:  No Other antiplatelets/anticoagulants:  Yes.   Plavix 75 mg daily   ASSESSMENT/PLAN: This is a 77 y.o. female who presents to [facility] with 2 weeks of increasing pain to her left foot.  Pain is at the transmetatarsal incision site from years ago and her left heel.  Also these areas of nonhealing wounds/ulcers.  Patient underwent bypass surgery to the same left lower extremity back on 06/09/2022.  Vascular surgery plans on taking the patient to the vascular lab for a left lower extremity angiogram with possible intervention on Friday at 03/24/2023.  I discussed in detail today with the patient and her son at the bedside the procedure, benefits, risks, and complications.  Both verbalized her understanding.  They would like to proceed as soon as possible.  I answered all their questions today.  Patient will be made n.p.o. after midnight on Friday, 03/24/2023.   -I discussed the plan in detail with Dr. Levora Dredge MD and he is in agreement with plan.   Marcie Bal [facility] 03/22/2023 12:13 PM

## 2023-03-23 DIAGNOSIS — M79672 Pain in left foot: Secondary | ICD-10-CM | POA: Diagnosis not present

## 2023-03-23 DIAGNOSIS — I70244 Atherosclerosis of native arteries of left leg with ulceration of heel and midfoot: Secondary | ICD-10-CM | POA: Diagnosis not present

## 2023-03-23 DIAGNOSIS — L97429 Non-pressure chronic ulcer of left heel and midfoot with unspecified severity: Secondary | ICD-10-CM | POA: Diagnosis not present

## 2023-03-23 DIAGNOSIS — I998 Other disorder of circulatory system: Secondary | ICD-10-CM | POA: Diagnosis not present

## 2023-03-23 DIAGNOSIS — L97529 Non-pressure chronic ulcer of other part of left foot with unspecified severity: Secondary | ICD-10-CM | POA: Diagnosis not present

## 2023-03-23 DIAGNOSIS — I70245 Atherosclerosis of native arteries of left leg with ulceration of other part of foot: Secondary | ICD-10-CM | POA: Diagnosis not present

## 2023-03-23 LAB — CREATININE, SERUM
Creatinine, Ser: 0.63 mg/dL (ref 0.44–1.00)
GFR, Estimated: >60 mL/min (ref >60)

## 2023-03-23 LAB — MRSA NEXT GEN BY PCR, NASAL: MRSA by PCR Next Gen: DETECTED — AB

## 2023-03-23 MED ORDER — OXYCODONE HCL 5 MG PO TABS
5.0000 mg | ORAL_TABLET | Freq: Four times a day (QID) | ORAL | Status: DC | PRN
  Administered 2023-03-23 – 2023-03-26 (×12): 5 mg via ORAL
  Filled 2023-03-23 (×12): qty 1

## 2023-03-23 MED ORDER — MUPIROCIN 2 % EX OINT
1.0000 | TOPICAL_OINTMENT | Freq: Two times a day (BID) | CUTANEOUS | Status: AC
  Administered 2023-03-23 – 2023-03-27 (×10): 1 via NASAL
  Filled 2023-03-23 (×3): qty 22

## 2023-03-23 MED ORDER — OXYCODONE HCL 5 MG PO TABS
5.0000 mg | ORAL_TABLET | Freq: Once | ORAL | Status: AC
  Administered 2023-03-23: 5 mg via ORAL
  Filled 2023-03-23: qty 1

## 2023-03-23 NOTE — Evaluation (Signed)
Occupational Therapy Evaluation Patient Details Name: Michele Matthews MRN: [id number] DOB: Jun 15, 1945 Today's Date: 03/23/2023   History of Present Illness Pt is a 77 y.o. female with medical history significant of HTN, combined systolic and diastolic heart failure, COPD, HLD, tobacco dependence, presenting with c/c of L foot pain, previous L transmet amputation (2022). MD assessment includes PAD, L foot cellulitis with heel ulcer, acute on chronic anemia, facial zoster, anxiety disorder, depression, and CKD.   Clinical Impression   Pt was seen for OT evaluation this date. Prior to hospital admission, pt reports that husband assists with bathing and other ADL's as needed. Pt lives with husband. Pt presents to acute OT demonstrating impaired ADL performance and functional mobility 2/2  (See OT problem list for additional functional deficits). Upon arrival to room pt supine in bed, agreeable to tx. NT present t/o the session assisting. Pt completed all bed mobility with Min Ax 2. Pt incontinent and aware. Pt completed a lateral scoot from bed to recliner with Max Ax2. Pt completed a bath while seated in chair with MOD A and washed face with supervision. Pt required max cueing for safety and sequencing. Pt completed STSx2 with Max A,+2 to maintain LLE NWB per pt request. Pt changed into clean gown and reported pain in LLE. Pt monitored during session. Pt left seated with chair alarm, call bell within reach, and breakfast tray in front. Pt would benefit from skilled OT services to address noted impairments and functional limitations (see below for any additional details) in order to maximize safety and independence while minimizing falls risk and caregiver burden. Anticipate the need for follow up OT services upon acute hospital DC.        If plan is discharge home, recommend the following: A little help with bathing/dressing/bathroom;A little help with walking and/or transfers;Assistance with  cooking/housework;Assist for transportation;Help with stairs or ramp for entrance    Functional Status Assessment  Patient has had a recent decline in their functional status and demonstrates the ability to make significant improvements in function in a reasonable and predictable amount of time.  Equipment Recommendations  Other (comment) (Defer to next venue of care)    Recommendations for Other Services       Precautions / Restrictions Precautions Precautions: Fall Restrictions Weight Bearing Restrictions: Yes LLE Weight Bearing: Weight bearing as tolerated      Mobility Bed Mobility Overal bed mobility: Needs Assistance Bed Mobility: Supine to Sit     Supine to sit: Min assist, +2 for physical assistance, HOB elevated       Patient Response: Anxious  Transfers Overall transfer level: Needs assistance Equipment used: 1 person hand held assist Transfers: Sit to/from Stand Sit to Stand: Max assist, +2 physical assistance                  Balance Overall balance assessment: Needs assistance Sitting-balance support: Feet unsupported, Single extremity supported Sitting balance-Leahy Scale: Fair     Standing balance support: Single extremity supported Standing balance-Leahy Scale: Poor                             ADL either performed or assessed with clinical judgement   ADL Overall ADL's : Needs assistance/impaired                                     Functional mobility  during ADLs: Supervision/safety;Set up;Moderate assistance;Maximal assistance;+2 for physical assistance;Cueing for safety;Cueing for sequencing General ADL Comments: Pt completed lateral scoot from bed to recliner with Max Ax2. Pt completed a bath while seated in chair with MOD A and washed face with supervision. Pt required max cueing for safety and sequencing. Pt required MAX A for STSx2  to maintain LLE NWB request.     Vision         Perception          Praxis         Pertinent Vitals/Pain Pain Assessment Pain Assessment: Faces Faces Pain Scale: Hurts whole lot Pain Location: L foot, heel and forefoot Pain Descriptors / Indicators: Constant, Grimacing, Tender, Moaning Pain Intervention(s): Monitored during session, Repositioned, Limited activity within patient's tolerance     Extremity/Trunk Assessment Upper Extremity Assessment Upper Extremity Assessment: Overall WFL for tasks assessed   Lower Extremity Assessment Lower Extremity Assessment: Generalized weakness;LLE deficits/detail LLE Deficits / Details: wounds at L heel and forefoot       Communication Communication Communication: Hearing impairment Cueing Techniques: Verbal cues;Visual cues;Tactile cues   Cognition Arousal: Alert Behavior During Therapy: WFL for tasks assessed/performed Overall Cognitive Status: Within Functional Limits for tasks assessed                                       General Comments       Exercises     Shoulder Instructions      Home Living Family/patient expects to be discharged to:: Private residence Living Arrangements: Spouse/significant other Available Help at Discharge: Family Type of Home: House Home Access: Stairs to enter Secretary/administrator of Steps: 3 Entrance Stairs-Rails: Right Home Layout: One level     Bathroom Shower/Tub: Chief Strategy Officer: Standard Bathroom Accessibility: Yes   Home Equipment: Agricultural consultant (2 wheels);Rollator (4 wheels);Tub bench;Wheelchair - manual;Cane - single point;Shower seat;BSC/3in1   Additional Comments: received per combination of pt report and chart review      Prior Functioning/Environment Prior Level of Function : Needs assist       Physical Assist : ADLs (physical)   ADLs (physical): Bathing Mobility Comments: reported Mod I household ambulator with rollator; unable to gather fall hx ADLs Comments: reported that husband assists with  bathing and other ADLs prn, ind with dressing        OT Problem List: Decreased strength;Decreased range of motion;Decreased coordination;Decreased activity tolerance;Decreased safety awareness;Impaired balance (sitting and/or standing)      OT Treatment/Interventions: Self-care/ADL training;Therapeutic exercise;Therapeutic activities;Energy conservation;DME and/or AE instruction;Patient/family education    OT Goals(Current goals can be found in the care plan section) Acute Rehab OT Goals Patient Stated Goal: to get better OT Goal Formulation: With patient Time For Goal Achievement: 04/06/23 Potential to Achieve Goals: Fair  OT Frequency: Min 1X/week    Co-evaluation              AM-PAC OT "6 Clicks" Daily Activity     Outcome Measure Help from another person eating meals?: A Little Help from another person taking care of personal grooming?: A Little Help from another person toileting, which includes using toliet, bedpan, or urinal?: A Lot Help from another person bathing (including washing, rinsing, drying)?: A Lot Help from another person to put on and taking off regular upper body clothing?: A Little Help from another person to put on and taking off regular lower body  clothing?: A Lot 6 Click Score: 15   End of Session    Activity Tolerance: Patient limited by pain Patient left: in chair;with call bell/phone within reach;with chair alarm set  OT Visit Diagnosis: Unsteadiness on feet (R26.81);Other abnormalities of gait and mobility (R26.89);Muscle weakness (generalized) (M62.81);Repeated falls (R29.6)                Time:  -    Charges:     Butch Penny, SOT

## 2023-03-23 NOTE — Progress Notes (Signed)
Progress Note    03/23/2023 8:13 AM * No surgery date entered *  Subjective:  Michele Matthews is a 77 yo female who presents to [facility] Emergency room with chief complaint of left foot pain. Patient states that she underwent a transmetatarsal amputation several years ago but it never quite healed properly. 2 weeks prior to presentation to [facility] noted increasing pain and a wound to the left foot. Pain is gradually increased.   We discussed again this morning in detail the procedure tomorrow for a left lower extremity angiogram to determine blood flow to her nonhealing extremity.  Patient again verbalizes understands and wishes to proceed.  No complaints overnight other than constant pain to that extremity which she was admitted for.  Vitals are remained stable.   Vitals:   03/22/23 2100 03/23/23 0009  BP: (!) 99/50 (!) 106/34  Pulse: 65 94  Resp: 16 19  Temp:  98.5 F (36.9 C)  SpO2: 100% 94%   Physical Exam: Cardiac:  RRR, Normal S1, S2. No murmurs. Lungs:  normal non-labored breathing, without Rales, rhonchi, wheezing  Incisions:  None Extremities:  Unable to palpate Left Popliteal, Dorsalis Pedis, and post tibial pulses. Open and draining transmetatarsal incision and heel. Left lower extremity with ischemic changes, without Gangrene , with cellulitis; with open wounds.  Abdomen: Positive bowel sounds throughout, soft, nontender nondistended. Neurologic: Patient is very hard of hearing.  Alert and oriented to person place and time.  Follows commands appropriately.  CBC    Component Value Date/Time   WBC 11.5 (H) 03/22/2023 0432   RBC 4.26 03/22/2023 0432   HGB 8.3 (L) 03/22/2023 0432   HGB 12.1 03/27/2014 1730   HCT 28.8 (L) 03/22/2023 0432   HCT 37.0 03/27/2014 1730   PLT 441 (H) 03/22/2023 0432   PLT 175 03/27/2014 1730   MCV 67.6 (L) 03/22/2023 0432   MCV 97 03/27/2014 1730   MCH 19.5 (L) 03/22/2023 0432   MCHC 28.8 (L) 03/22/2023 0432   RDW 22.8 (H) 03/22/2023 0432    RDW 13.3 03/27/2014 1730   LYMPHSABS 0.8 03/21/2023 1121   LYMPHSABS 1.9 11/25/2013 0401   MONOABS 0.7 03/21/2023 1121   MONOABS 0.7 11/25/2013 0401   EOSABS 0.1 03/21/2023 1121   EOSABS 0.2 11/25/2013 0401   BASOSABS 0.0 03/21/2023 1121   BASOSABS 0.0 11/25/2013 0401    BMET    Component Value Date/Time   NA 135 03/22/2023 0432   NA 140 03/28/2014 0441   K 3.9 03/22/2023 0432   K 4.0 03/28/2014 0441   CL 105 03/22/2023 0432   CL 110 (H) 03/28/2014 0441   CO2 24 03/22/2023 0432   CO2 24 03/28/2014 0441   GLUCOSE 99 03/22/2023 0432   GLUCOSE 86 03/28/2014 0441   BUN 17 03/22/2023 0432   BUN 19 (H) 03/28/2014 0441   CREATININE 0.60 03/22/2023 0432   CREATININE 0.79 11/24/2015 0908   CALCIUM 9.4 03/22/2023 0432   CALCIUM 8.7 03/28/2014 0441   GFRNONAA >60 03/22/2023 0432   GFRNONAA 76 11/24/2015 0908   GFRAA >60 07/28/2017 0507   GFRAA 87 11/24/2015 0908    INR    Component Value Date/Time   INR 1.0 05/26/2022 1354   INR 2.0 03/30/2014 0336     Intake/Output Summary (Last 24 hours) at 03/23/2023 0813 Last data filed at 03/22/2023 1901 Gross per 24 hour  Intake --  Output 400 ml  Net -400 ml     Assessment/Plan:  77 y.o. female presents to  [facility] emergency department with left lower extremity pain with open ulcerations.* No surgery date entered *  Discussed the procedure again today with the patient.  She verbalized understanding and wants to proceed tomorrow on 03/24/2023 for left lower extremity angiogram with possible intervention.   DVT prophylaxis: ASA 81 mg daily Plavix 75 mg daily Lovenox injection subcu 40 mg every 24 hours   Marcie Bal [facility] 03/23/2023 8:13 AM

## 2023-03-23 NOTE — Progress Notes (Signed)
Progress Note   [name] ZOX:[id number] DOB: 06/07/1945 DOA: 03/21/2023     2 DOS: the patient was seen and examined on 03/23/2023   Brief hospital course ARELLY RICHMAN is a 77 y.o. female with medical history significant of hypertension, combined systolic and diastolic heart failure, COPD, hyperlipidemia, tobacco dependence who presents to the ED with chief complaint of left foot pain.  Patient states that she underwent a transmetatarsal amputation several years ago but it never quite healed properly.  2 weeks prior to presentation to [facility] noted increasing pain and a wound to the left foot.  Pain is gradually increased.  No fevers chills.  No nausea or vomiting.  Patient also endorsed a rash on the right side of her face consistent with shingles outbreak.  She denies pain or pruritus from this rash but states that she knows its there and is compelled to touch it.   On my evaluation patient is resting in bed.  She is in no distress but reports improvement after administration of pain medication.  Vital signs are stable.  Laboratory investigation significant for anemia with hemoglobin 6.4.  Patient did have mildly elevated lactic acid which improved after administration of intravenous fluids.     ED Course: Patient was given intravenous fluids, pain medication, IV antibiotics after culture data was taken.  Hospitalist contacted to admit under working diagnosis of sepsis secondary to infection cellulitis of left foot.        Assessment and Plan:  Left foot rest pain Known history of PAD Status post left TMA Patient presented to the ER for evaluation of worsening left foot pain S/P LLE angiogram which was done 02/24,  Diffuse atherosclerotic changes of the left lower extremity, occlusion of the SFA stents as well as the SFA and the popliteal in its entirety.  There appears to be anterior tibial runoff however the this does not appear to cross the ankle. Patient continues to  smoke Continue aspirin, Plavix and statin Pain control Noninvasive physiologic vascular study showed severe right lower extremity arterial occlusive disease.  Unable to assess left lower extremity due to pain Appreciate vascular surgery input, plan for left lower extremity angiogram in a.m.       Left foot pain Concern for left foot cellulitis on admission Left heel ulcer Remains on empiric antibiotic therapy with vancomycin and Rocephin Left foot x-ray shows a new lucency within the soft tissues distal and distal lateral to the fifth metatarsal stump and distal to the second metatarsal stump. No definitive new adjacent cortical erosion is seen. Re demonstration of ulceration of the distal posterior heel. No definitive cortical erosion is seen within the adjacent distal posterior calcaneus to indicate radiographic evidence of acute osteomyelitis. Awaiting podiatry input Continue empiric antibiotic therapy with Rocephin       Acute on chronic anemia History of iron deficiency anemia Hemoglobin on arrival 6.4.  Guaiac negative in ED.  No evidence of acute blood loss.  Vital signs stable.   Low MCV and high RDW indicative of iron deficiency anemia.  Patient carries this diagnosis but is not adherent to home iron therapies. Patient has been transfused 1 unit of packed RBC with improvement in her H&H Monitor closely during this hospitalization       Facial zoster Present on right side of face.   Patient denies any visual disturbance. Rash in different stages of crusting Continue Valtrex     Chronic systolic diastolic congestive heart failure LVEF 40 to 45%  Appears euvolemic at this time. Continue carvedilol and furosemide Will add Cozaar     COPD, unspecified type/severity Active smoker Patient started smoking at age 84.  Not ready to quit.  ' Smoking cessation was discussed with patient in detail Continue nicotine transdermal patch       Essential hypertension Blood  pressure uncontrolled Increase dose of carvedilol Add Cozaar     Generalized anxiety disorder Depression Stable Continue sertraline and trazodone       Stage IIIa chronic kidney disease Stable Monitor renal function closely              Subjective: Events of last night noted.  Patient was very confused and IV morphine was discontinued.  This morning she is sitting up in recliner eating breakfast.  Physical Exam: Vitals:   03/22/23 2012 03/22/23 2100 03/23/23 0009 03/23/23 0813  BP: 98/66 (!) 99/50 (!) 106/34 118/76  Pulse: (!) 103 65 94 (!) 103  Resp: 16 16 19 18   Temp: 97.9 F (36.6 C)  98.5 F (36.9 C) 98.9 F (37.2 C)  TempSrc: Oral     SpO2: 100% 100% 94% 96%  Weight:      Height:       General: NAD.  Appears frail, chronically ill-appearing HEENT: Normocephalic, atraumatic Neck, supple, trachea midline, no tenderness Heart: Distant heart sounds.  No murmurs.  No pedal edema Lungs: Lungs are diminished.  Scattered crackles.  Normal work of breathing.  Room air Abdomen: Thin, soft NT/ND, normal bowel sounds Extremities: Left status post TMA.  Wound noted on distal aspect Skin: Wound on distal aspect of left foot stump Neurologic: Cranial nerves grossly intact, sensation intact, alert and oriented x3 Psychiatric: Normal affect   Data Reviewed: Labs reviewed There are no new results to review at this time.  Family Communication: None  Disposition: Status is: Inpatient Remains inpatient appropriate because: Left lower extremity angiogram in a.m.  Planned Discharge Destination:  TBD    Time spent: 33 minutes  Author: Lucile Shutters, MD 03/23/2023 11:44 AM  For on call review [email or web address].

## 2023-03-23 NOTE — Consult Note (Signed)
ORTHOPAEDIC CONSULTATION  REQUESTING PHYSICIAN: Lucile Shutters, MD  Chief Complaint: Left foot pain  HPI: Michele Matthews is a 77 y.o. female who complains of worsening left foot pain.  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  She has had a ulcer and wound on this left foot for some time now.  Chart review shows transmetatarsal amputation performed in August 2022.In January of this year she was seen at outside provider's office with noted heel decubitus ulcer and residual wound at transmetatarsal amputation site.  Today upon entering the room she is complaining of severe pain into the left foot and lower leg at this time.  She has been seen by vascular and they are planning on angio tomorrow.  Past Medical History:  Diagnosis Date   Allergy    COPD (chronic obstructive pulmonary disease) (HCC)    Coronary artery disease    Depression    Emphysema of lung (HCC)    GERD (gastroesophageal reflux disease)    Hyperlipidemia    Hypertension    Seizures (HCC)    Stroke [facility])    Past Surgical History:  Procedure Laterality Date   COLONOSCOPY WITH PROPOFOL N/A 09/08/2021   Procedure: COLONOSCOPY WITH PROPOFOL;  Surgeon: Toledo, Boykin Nearing, MD;  Location: [facility] ENDOSCOPY;  Service: Gastroenterology;  Laterality: N/A;   ESOPHAGOGASTRODUODENOSCOPY N/A 08/10/2015   Procedure: ESOPHAGOGASTRODUODENOSCOPY (EGD);  Surgeon: Scot Jun, MD;  Location: [facility] ENDOSCOPY;  Service: Endoscopy;  Laterality: N/A;   ESOPHAGOGASTRODUODENOSCOPY N/A 09/08/2021   Procedure: ESOPHAGOGASTRODUODENOSCOPY (EGD);  Surgeon: Toledo, Boykin Nearing, MD;  Location: [facility] ENDOSCOPY;  Service: Gastroenterology;  Laterality: N/A;   ESOPHAGOGASTRODUODENOSCOPY (EGD) WITH PROPOFOL N/A 12/11/2014   Procedure: ESOPHAGOGASTRODUODENOSCOPY (EGD) WITH PROPOFOL;  Surgeon: Midge Minium, MD;  Location: [facility] ENDOSCOPY;  Service: Endoscopy;  Laterality: N/A;   ESOPHAGOGASTRODUODENOSCOPY (EGD) WITH PROPOFOL N/A 12/29/2015    Procedure: ESOPHAGOGASTRODUODENOSCOPY (EGD) WITH PROPOFOL;  Surgeon: Napoleon Form, MD;  Location: [facility] ENDOSCOPY;  Service: Endoscopy;  Laterality: N/A;   FEMORAL-TIBIAL BYPASS GRAFT Left 06/09/2022   Procedure: BYPASS GRAFT FEMORAL-TIBIAL ARTERY;  Surgeon: Renford Dills, MD;  Location: [facility] ORS;  Service: Vascular;  Laterality: Left;   LOWER EXTREMITY ANGIOGRAPHY Left 11/25/2020   Procedure: Lower Extremity Angiography;  Surgeon: Annice Needy, MD;  Location: [facility] INVASIVE CV LAB;  Service: Cardiovascular;  Laterality: Left;   LOWER EXTREMITY ANGIOGRAPHY Left 11/30/2020   Procedure: Lower Extremity Angiography;  Surgeon: Annice Needy, MD;  Location: [facility] INVASIVE CV LAB;  Service: Cardiovascular;  Laterality: Left;   LOWER EXTREMITY ANGIOGRAPHY Left 05/31/2022   Procedure: Lower Extremity Angiography;  Surgeon: Renford Dills, MD;  Location: [facility] INVASIVE CV LAB;  Service: Cardiovascular;  Laterality: Left;   TRANSMETATARSAL AMPUTATION Left 12/02/2020   Procedure: TRANSMETATARSAL AMPUTATION;  Surgeon: Rosetta Posner, DPM;  Location: [facility] ORS;  Service: Podiatry;  Laterality: Left;   Social History   Socioeconomic History   Marital status: Married    Spouse name: Not on file   Number of children: Not on file   Years of education: Not on file   Highest education level: Not on file  Occupational History   Occupation: retired  Tobacco Use   Smoking status: Every Day    Current packs/day: 1.00    Types: Cigarettes   Smokeless tobacco: Current  Vaping Use   Vaping status: Never Used  Substance and Sexual Activity   Alcohol use: No   Drug use: No   Sexual activity: Not Currently  Other  Topics Concern   Not on file  Social History Narrative   Not on file   Social Determinants of Health   Financial Resource Strain: Medium Risk (11/08/2021)   Overall Financial Resource Strain (CARDIA)    Difficulty of Paying Living Expenses: Somewhat hard  Food Insecurity: No Food Insecurity  (03/21/2023)   Hunger Vital Sign    Worried About Running Out of Food in the Last Year: Never true    Ran Out of Food in the Last Year: Never true  Transportation Needs: No Transportation Needs (03/21/2023)   PRAPARE - Administrator, Civil Service (Medical): No    Lack of Transportation (Non-Medical): No  Physical Activity: Not on file  Stress: Not on file  Social Connections: Not on file   Family History  Problem Relation Age of Onset   Alzheimer's disease Father    Lung cancer Sister    Breast cancer Sister    Cancer Mother    No Known Allergies Prior to Admission medications   Medication Sig Start Date End Date Taking? Authorizing Provider  acetaminophen (TYLENOL) 500 MG tablet Take 2 tablets (1,000 mg total) by mouth 3 (three) times daily. 12/08/20  Yes Danford, Earl Lites, MD  ascorbic acid (VITAMIN C) 250 MG tablet Take 1 tablet (250 mg total) by mouth 2 (two) times daily. Patient not taking: Reported on 05/26/2022 12/08/20   Alberteen Sam, MD  aspirin EC 81 MG tablet Take 1 tablet (81 mg total) by mouth daily. Swallow whole. 06/14/22 06/14/23  Gillis Santa, MD  atorvastatin (LIPITOR) 20 MG tablet Take 20 mg by mouth daily. 08/26/21   [provider]  busPIRone (BUSPAR) 7.5 MG tablet Take 1 tablet (7.5 mg total) by mouth 2 (two) times daily. 11/03/16   Karamalegos, Netta Neat, DO  carvedilol (COREG) 6.25 MG tablet Take 0.5 tablets (3.125 mg total) by mouth 2 (two) times daily with a meal. 06/13/22 09/11/22  Gillis Santa, MD  clopidogrel (PLAVIX) 75 MG tablet Take 75 mg by mouth daily. 11/16/20   [provider]  diphenhydramine-acetaminophen (TYLENOL PM) 25-500 MG TABS tablet Take 1 tablet by mouth at bedtime as needed.    [provider]  famotidine (PEPCID) 20 MG tablet Take 1 tablet (20 mg total) by mouth 2 (two) times daily as needed for heartburn or indigestion. 06/13/22 07/13/22  Gillis Santa, MD  feeding supplement, ENSURE ENLIVE,  (ENSURE ENLIVE) LIQD Take 237 mLs by mouth 3 (three) times daily between meals. 07/27/17   Enid Baas, MD  ferrous sulfate 325 (65 FE) MG tablet Take 1 tablet (325 mg total) by mouth 2 (two) times daily with a meal. Patient not taking: Reported on 05/26/2022 09/09/21   Enedina Finner, MD  furosemide (LASIX) 20 MG tablet Take 1 tablet (20 mg total) by mouth daily. 07/27/17   Enid Baas, MD  hydrOXYzine (ATARAX/VISTARIL) 10 MG tablet Take 1 tablet (10 mg total) by mouth 2 (two) times daily as needed for anxiety (sleep). 11/03/16   Smitty Cords, DO  Multiple Vitamin (MULTIVITAMIN) tablet Take 1 tablet by mouth daily.    [provider]  oxyCODONE (OXY IR/ROXICODONE) 5 MG immediate release tablet Take 1 tablet (5 mg total) by mouth every 6 (six) hours as needed for severe pain. 06/13/22   Gillis Santa, MD  senna-docusate (SENOKOT-S) 8.6-50 MG tablet Take 2 tablets by mouth 2 (two) times daily. 12/08/20   Danford, Earl Lites, MD  sertraline (ZOLOFT) 100 MG tablet Take 1 tablet (  100 mg total) by mouth 2 (two) times daily. 07/15/16   Karamalegos, Netta Neat, DO  Tetrahydrozoline HCl (VISINE OP) Place 1 drop into both eyes 2 (two) times daily as needed (dry eyes).    [provider]   US ARTERIAL ABI (SCREENING LOWER EXTREMITY)  Result Date: 03/23/2023 CLINICAL DATA:  Left lower extremity pain Hypertension Former smoker Left toe amputation EXAM: NONINVASIVE PHYSIOLOGIC VASCULAR STUDY OF BILATERAL LOWER EXTREMITIES TECHNIQUE: Evaluation of both lower extremities were performed at rest, including calculation of ankle-brachial indices with single level pressure measurements and doppler recording. COMPARISON:  None available. FINDINGS: Right ABI:  0.43 Left ABI:  Unable to obtain due to pain Right Lower Extremity: Monophasic waveforms of the posterior tibial and dorsalis pedis arteries. Left Lower Extremity:  Unable to obtain due to pain. < 0.5 Severe PAD IMPRESSION: 1. Unable  to evaluate the left lower extremity due to severe pain. 2. 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 Electronically Signed   By: Acquanetta Belling M.D.   On: 03/23/2023 08:24    Positive ROS: All other systems have been reviewed and were otherwise negative with the exception of those mentioned in the HPI and as above.  12 point ROS was performed.  Physical Exam: General: Alert and oriented.  No apparent distress.  Vascular:  Left foot:Dorsalis Pedis:  absent Posterior Tibial:  absent  Right foot: Dorsalis Pedis:  absent Posterior Tibial:  absent  Neuro:intact gross sensation  Derm: Right foot without ulceration.  Left foot with dry ulcer on the distal medial aspect of the foot with dry necrotic ulcer on the distal lateral aspect of the foot.  Also a pressure induced necrotic ulcer on the left heel is noted.  Drainage noted from the heel ulceration is mild in nature.  Mild erythema surrounding all of the wound sites at this time.  Ortho/MS: Status post transmetatarsal amputation left foot.  Personally reviewed the x-rays that shows the transmetatarsal amputation.  It shows soft tissue shadows at the distal lateral fifth metatarsal and posterior left heel.  No obvious gas or erosive changes at this time.  Assessment: Severe peripheral vascular disease Posterior heel decubitus ulcer Necrotic left foot ulcer  Plan: Patient is in severe pain.  She expressed she has rest pain at night and has to hold her leg off the side of the bed.  She has a longstanding ulcer on her left transmetatarsal amputation site that has failed to heal.  At this time she has an area of necrotic tissue distal left fifth metatarsal region with ulceration to the distal medial aspect as well as a pressure ulcer of the left heel with drainage.  Will await vascular evaluation but I do not believe there is very much from a limb salvage standpoint.  I doubt the necrotic ulceration on the distal lateral fifth  metatarsal will heal and she would still have this large heel decubitus ulceration that will likely not heal.  If they can establish better perfusion long term wound care can be performed.  I do not recommend debridement of the left foot.  Given her pain status I suspect she would be better off with a below the knee or above-the-knee amputation.  I recommended a heel reducing pad to the left heel and they can continue with dry dressings to the left forefoot for now.  She does not want any bandaging on the foot due to severe pain currently.      Irean Hong, DPM Cell [phone]  03/23/2023 12:39 PM

## 2023-03-24 ENCOUNTER — Encounter
Admission: EM | Disposition: A | Discharge status: Home Health | Payer: Self-pay | Source: Home / Self Care | Attending: Internal Medicine

## 2023-03-24 DIAGNOSIS — I708 Atherosclerosis of other arteries: Secondary | ICD-10-CM | POA: Diagnosis not present

## 2023-03-24 DIAGNOSIS — M79672 Pain in left foot: Secondary | ICD-10-CM | POA: Diagnosis not present

## 2023-03-24 DIAGNOSIS — I70221 Atherosclerosis of native arteries of extremities with rest pain, right leg: Secondary | ICD-10-CM

## 2023-03-24 DIAGNOSIS — L03116 Cellulitis of left lower limb: Secondary | ICD-10-CM | POA: Diagnosis not present

## 2023-03-24 DIAGNOSIS — I998 Other disorder of circulatory system: Secondary | ICD-10-CM | POA: Diagnosis not present

## 2023-03-24 DIAGNOSIS — Z9889 Other specified postprocedural states: Secondary | ICD-10-CM | POA: Diagnosis not present

## 2023-03-24 DIAGNOSIS — I70245 Atherosclerosis of native arteries of left leg with ulceration of other part of foot: Secondary | ICD-10-CM | POA: Diagnosis not present

## 2023-03-24 DIAGNOSIS — L97529 Non-pressure chronic ulcer of other part of left foot with unspecified severity: Secondary | ICD-10-CM | POA: Diagnosis not present

## 2023-03-24 HISTORY — PX: LOWER EXTREMITY ANGIOGRAPHY: CATH118251

## 2023-03-24 SURGERY — LOWER EXTREMITY ANGIOGRAPHY
Anesthesia: Moderate Sedation | Laterality: Left

## 2023-03-24 MED ORDER — LIDOCAINE HCL (PF) 1 % IJ SOLN
INTRAMUSCULAR | Status: DC | PRN
  Administered 2023-03-24: 10 mL via INTRADERMAL

## 2023-03-24 MED ORDER — IODIXANOL 320 MG/ML IV SOLN
INTRAVENOUS | Status: DC | PRN
  Administered 2023-03-24: 35 mL via INTRA_ARTERIAL

## 2023-03-24 MED ORDER — SODIUM CHLORIDE 0.9% FLUSH
3.0000 mL | Freq: Two times a day (BID) | INTRAVENOUS | Status: DC
  Administered 2023-03-24 – 2023-04-02 (×14): 3 mL via INTRAVENOUS

## 2023-03-24 MED ORDER — MIDAZOLAM HCL 2 MG/ML PO SYRP
ORAL_SOLUTION | ORAL | Status: AC
  Administered 2023-03-24: 8 mg via ORAL
  Filled 2023-03-24: qty 5

## 2023-03-24 MED ORDER — METHYLPREDNISOLONE SODIUM SUCC 125 MG IJ SOLR: 125.0000 mg | Freq: Once | INTRAMUSCULAR | Status: DC | PRN

## 2023-03-24 MED ORDER — FENTANYL CITRATE PF 50 MCG/ML IJ SOSY
PREFILLED_SYRINGE | INTRAMUSCULAR | Status: AC
  Filled 2023-03-24: qty 1

## 2023-03-24 MED ORDER — DIPHENHYDRAMINE HCL 50 MG/ML IJ SOLN: 50.0000 mg | Freq: Once | INTRAMUSCULAR | Status: DC | PRN

## 2023-03-24 MED ORDER — MORPHINE SULFATE (PF) 2 MG/ML IV SOLN
2.0000 mg | Freq: Once | INTRAVENOUS | Status: AC
  Administered 2023-03-24: 2 mg via INTRAVENOUS
  Filled 2023-03-24: qty 1

## 2023-03-24 MED ORDER — FENTANYL CITRATE (PF) 100 MCG/2ML IJ SOLN
INTRAMUSCULAR | Status: DC | PRN
  Administered 2023-03-24 (×4): 12.5 ug via INTRAVENOUS

## 2023-03-24 MED ORDER — HEPARIN (PORCINE) IN NACL 1000-0.9 UT/500ML-% IV SOLN
INTRAVENOUS | Status: DC | PRN
  Administered 2023-03-24: 1000 mL

## 2023-03-24 MED ORDER — HEPARIN SODIUM (PORCINE) 1000 UNIT/ML IJ SOLN
INTRAMUSCULAR | Status: DC | PRN
  Administered 2023-03-24: 4000 [IU] via INTRAVENOUS

## 2023-03-24 MED ORDER — MIDAZOLAM HCL 2 MG/2ML IJ SOLN
INTRAMUSCULAR | Status: AC
  Filled 2023-03-24: qty 2

## 2023-03-24 MED ORDER — FAMOTIDINE 20 MG PO TABS: 40.0000 mg | ORAL_TABLET | Freq: Once | ORAL | Status: DC | PRN

## 2023-03-24 MED ORDER — HEPARIN SODIUM (PORCINE) 1000 UNIT/ML IJ SOLN
INTRAMUSCULAR | Status: AC
  Filled 2023-03-24: qty 10

## 2023-03-24 MED ORDER — MIDAZOLAM HCL 2 MG/ML PO SYRP
8.0000 mg | ORAL_SOLUTION | Freq: Once | ORAL | Status: AC | PRN
  Filled 2023-03-24: qty 5

## 2023-03-24 MED ORDER — CEFAZOLIN SODIUM-DEXTROSE 2-4 GM/100ML-% IV SOLN: 2.0000 g | INTRAVENOUS | Status: DC

## 2023-03-24 MED ORDER — SODIUM CHLORIDE 0.9% FLUSH: 3.0000 mL | INTRAVENOUS | Status: DC | PRN

## 2023-03-24 MED ORDER — SODIUM CHLORIDE 0.9 % IV SOLN: INTRAVENOUS | Status: AC

## 2023-03-24 MED ORDER — SODIUM CHLORIDE 0.9 % IV SOLN
INTRAVENOUS | Status: DC
Start: 2023-03-24 — End: 2023-03-24

## 2023-03-24 MED ORDER — SODIUM CHLORIDE 0.9 % IV SOLN: 250.0000 mL | INTRAVENOUS | Status: AC | PRN

## 2023-03-24 MED ORDER — MIDAZOLAM HCL 2 MG/2ML IJ SOLN
INTRAMUSCULAR | Status: DC | PRN
  Administered 2023-03-24 (×4): .5 mg via INTRAVENOUS

## 2023-03-24 SURGICAL SUPPLY — 20 items
BALLN LUTONIX DCB 5X60X130 (BALLOONS) ×1
BALLN LUTONIX DCB 6X40X130 (BALLOONS) ×1
BALLOON LUTONIX DCB 5X60X130 (BALLOONS) IMPLANT
BALLOON LUTONIX DCB 6X40X130 (BALLOONS) IMPLANT
CATH ANGIO 5F PIGTAIL 65CM (CATHETERS) IMPLANT
DEVICE PRESTO INFLATION (MISCELLANEOUS) IMPLANT
DEVICE STARCLOSE SE CLOSURE (Vascular Products) IMPLANT
GLIDEWIRE ADV .035X260CM (WIRE) IMPLANT
GOWN STRL REUS W/ TWL LRG LVL3 (GOWN DISPOSABLE) ×1 IMPLANT
NDL ENTRY 21GA 7CM ECHOTIP (NEEDLE) IMPLANT
NEEDLE ENTRY 21GA 7CM ECHOTIP (NEEDLE) ×1 IMPLANT
PACK ANGIOGRAPHY (CUSTOM PROCEDURE TRAY) ×1 IMPLANT
SET INTRO CAPELLA COAXIAL (SET/KITS/TRAYS/PACK) IMPLANT
SHEATH BRITE TIP 5FRX11 (SHEATH) IMPLANT
SHEATH BRITE TIP 6FR X 23 (SHEATH) IMPLANT
STENT LIFESTENT 5F 6X60X135 (Permanent Stent) IMPLANT
SUT MNCRL AB 4-0 PS2 18 (SUTURE) IMPLANT
SYR MEDRAD MARK 7 150ML (SYRINGE) IMPLANT
TUBING CONTRAST HIGH PRESS 72 (TUBING) IMPLANT
WIRE GUIDERIGHT .035X150 (WIRE) IMPLANT

## 2023-03-24 NOTE — Care Management Important Message (Signed)
Important Message  Patient Details  Name: Michele Matthews MRN: [id number] Date of Birth: 1945/07/08   Important Message Given:  N/A - LOS <3 / Initial given by admissions     Michele Matthews 03/24/2023, 10:02 AM

## 2023-03-24 NOTE — Plan of Care (Signed)
  Problem: Fluid Volume: Goal: Hemodynamic stability will improve Outcome: Progressing   Problem: Clinical Measurements: Goal: Diagnostic test results will improve Outcome: Progressing Goal: Signs and symptoms of infection will decrease Outcome: Progressing   Problem: Respiratory: Goal: Ability to maintain adequate ventilation will improve Outcome: Progressing   Problem: Education: Goal: Knowledge of General Education information will improve Description: Including pain rating scale, medication(s)/side effects and non-pharmacologic comfort measures Outcome: Progressing   Problem: Health Behavior/Discharge Planning: Goal: Ability to manage health-related needs will improve Outcome: Progressing   Problem: Clinical Measurements: Goal: Ability to maintain clinical measurements within normal limits will improve Outcome: Progressing Goal: Will remain free from infection Outcome: Progressing Goal: Diagnostic test results will improve Outcome: Progressing Goal: Respiratory complications will improve Outcome: Progressing Goal: Cardiovascular complication will be avoided Outcome: Progressing   Problem: Activity: Goal: Risk for activity intolerance will decrease Outcome: Progressing   Problem: Nutrition: Goal: Adequate nutrition will be maintained Outcome: Progressing   Problem: Coping: Goal: Level of anxiety will decrease Outcome: Progressing   Problem: Elimination: Goal: Will not experience complications related to bowel motility Outcome: Progressing Goal: Will not experience complications related to urinary retention Outcome: Progressing   Problem: Pain Management: Goal: General experience of comfort will improve Outcome: Progressing   Problem: Safety: Goal: Ability to remain free from injury will improve Outcome: Progressing

## 2023-03-24 NOTE — Progress Notes (Signed)
PT Cancellation Note  Patient Details Name: Michele Matthews MRN: [id number] DOB: 22-Sep-1945   Cancelled Treatment:    Reason Eval/Treat Not Completed: Patient at procedure or test/unavailable, will attempt to see pt at a future date/time as medically appropriate.     Ovidio Hanger PT, DPT 03/24/23, 2:02 PM

## 2023-03-24 NOTE — Progress Notes (Signed)
Dr. Gilda Crease came and assessed pt. MD spoke with pt. Spouse & daughter at bedside. MD spoke with both & they verbalized understanding of conversation.

## 2023-03-24 NOTE — Op Note (Signed)
[facility]  Percutaneous Study/Intervention Procedural Note   Date of Surgery: 03/24/2023,3:40 PM  Surgeon:Oyinkansola Truax, Latina Craver   Pre-operative Diagnosis:  Atherosclerotic occlusive disease bilateral lower extremities with rest pain bilaterally and ulceration of the left foot  Post-operative diagnosis:  Same  Procedure(s) Performed:  1.  Abdominal aortogram  2.  Left lower extremity distal runoff second-order catheter placement  3.  Percutaneous transluminal angioplasty of the right common iliac artery  4.  Percutaneous transluminal angioplasty and stent placement right external iliac artery  5.  StarClose right common femoral artery   Anesthesia: Conscious sedation was administered by the interventional radiology RN under my direct supervision. IV Versed plus fentanyl were utilized. Continuous ECG, pulse oximetry and blood pressure was monitored throughout the entire procedure. Conscious sedation was administered for a total of 42 minutes.  Sheath: 23 cm 6 French Pinnacle sheath  Contrast: 35 cc   Fluoroscopy Time: 4.2 minutes  Indications: Patient presented to the emergency room with increasing pain of the left lower extremity as well as open wounds of the left foot.  Clinically she presented with cellulitis of her left foot.  Physical examination as well as noninvasive studies demonstrate severe atherosclerotic occlusive disease.    Procedure:  Michele Matthews a 77 y.o. female who was identified and appropriate procedural time out was performed.  The patient was then placed supine on the table and prepped and draped in the usual sterile fashion.  Ultrasound was used to evaluate the right common femoral artery.  It was echolucent and pulsatile indicating it is patent .  An ultrasound image was acquired for the permanent record.  A micropuncture needle was used to access the right common femoral artery under direct ultrasound guidance.  The microwire was then  advanced under fluoroscopic guidance without difficulty followed by the micro-sheath.  A 0.035 J wire would not advance and therefore hand-injection of contrast was performed which demonstrated tandem greater than 90% stenoses in the proximal external iliac artery.  An advantage wire was then selected and used to cross the lesions and then advanced into the aorta.  5 French sheath was then placed and the pigtail catheter advanced over the wire.      Imaging of the abdominal aorta was performed in the AP projection.  Pigtail catheter was then repositioned and an RAO projection of the pelvis was obtained.  Pigtail catheter and advantage wire were then used to cross the aortic bifurcation the pigtail catheter was advanced down into the common femoral artery on the left.  Hand-injection contrast in the oblique LAO projection was obtained.  Subsequently in the AP projection the distal runoff was performed.  After review these images attention was returned to the critical lesions within the external iliac and the common iliac artery lesion on the right.  The 5 [place] Shiley was exchanged for a 23 cm 6 [place] sheath.  A 6 mm x 60 mm life stent was then selected and deployed across the external iliac artery lesions.  A 5 mm x 60 mm Lutonix drug-eluting balloon was then advanced across the stent and inflated to 8 atm for approximately 1 minute.  Next a 6 mm x 40 mm Lutonix drug-eluting balloon was advanced across the proximal common iliac artery lesion on the right inflated to 8 atm for 1 minute.  Follow-up imaging demonstrated less than 10% residual stenosis.  RAO projection of the groin was obtained and a StarClose device was deployed without difficulty.   Findings:  Aortogram: The abdominal aorta is opacified with a bolus injection contrast.  There is some mild dilatation distally there is diffuse calcific disease but there are no hemodynamically significant stenoses.  The aortic bifurcation is patent.  The  right common iliac artery demonstrates a 70% stenosis proximally that was 2230 mm in length.  Distal to this lesion the common iliac was widely patent.  The external iliac demonstrates 2 tandem focal lesions that are greater than 90% throughout its course.  On the left the common iliac artery is widely patent.  The previously placed stent in the external iliac artery is widely patent.  Right Lower Extremity: The right common femoral demonstrates diffuse disease but is patent.  SFA is occluded at its origin.  Profunda femoris is diffusely diseased quite severely so but is patent.  Left Lower Extremity: The left common femoral demonstrates moderate restenosis proximally but remains patent.  Distally it has changes characteristic of femoral endarterectomy.  The profunda femoris is widely patent in its main trunk but diffusely diseased and its branches.  The previous femoral-tibial bypass is occluded.  The SFA is occluded throughout its length as is the entire popliteal artery.  Tibioperoneal trunk is also occluded.  The posterior tibial and anterior tibial arteries are occluded.  The peroneal is reconstituted proximally but has multilevel disease and several areas of occlusion.  It does not cross the ankle.  There is no named artery that crosses the ankle into the foot.  There is no visible perfusion of the foot although the patient is moving significantly.  Following angioplasty the right common iliac artery is widely patent with less than 15% residual stenosis.  Following angioplasty and stent placement the right external iliac artery is widely patent with less than 10% residual stenosis.    Disposition: Patient was taken to the recovery room in stable condition having tolerated the procedure well.  Earl Lites Davi Rotan 03/24/2023,3:40 PM

## 2023-03-24 NOTE — Plan of Care (Signed)
  Problem: Fluid Volume: Goal: Hemodynamic stability will improve Outcome: Progressing   Problem: Clinical Measurements: Goal: Signs and symptoms of infection will decrease Outcome: Progressing   Problem: Clinical Measurements: Goal: Will remain free from infection Outcome: Progressing   Problem: Activity: Goal: Risk for activity intolerance will decrease Outcome: Progressing

## 2023-03-24 NOTE — Progress Notes (Signed)
Pt. Received into specials pre-procedural area. Pt. Freq. Yells out in pain, stating "my foot is killing me."Pt. med. Now with Versed 8 mg p.o. now for anxiety..the patient. Able to answer questions appropriately with prompting.

## 2023-03-24 NOTE — Progress Notes (Signed)
Progress Note   [name] ZOX:[id number] DOB: May 18, 1945 DOA: 03/21/2023     3 DOS: the patient was seen and examined on 03/24/2023   Brief hospital course:  Michele Matthews is a 77 y.o. female with medical history significant of hypertension, combined systolic and diastolic heart failure, COPD, hyperlipidemia, tobacco dependence who presents to the ED with chief complaint of left foot pain.  Patient states that she underwent a transmetatarsal amputation several years ago but it never quite healed properly.  2 weeks prior to presentation to [facility] noted increasing pain and a wound to the left foot.  Pain is gradually increased.  No fevers chills.  No nausea or vomiting.  Patient also endorsed a rash on the right side of her face consistent with shingles outbreak.  She denies pain or pruritus from this rash but states that she knows its there and is compelled to touch it.   On my evaluation patient is resting in bed.  She is in no distress but reports improvement after administration of pain medication.  Vital signs are stable.  Laboratory investigation significant for anemia with hemoglobin 6.4.  Patient did have mildly elevated lactic acid which improved after administration of intravenous fluids.     ED Course: Patient was given intravenous fluids, pain medication, IV antibiotics after culture data was taken.  Hospitalist contacted to admit under working diagnosis of sepsis secondary to infection cellulitis of left foot.       Assessment and Plan:  Left foot rest pain Known history of PAD Status post left TMA Patient presented to the ER for evaluation of worsening left foot pain S/P LLE angiogram which was done 02/24,  Diffuse atherosclerotic changes of the left lower extremity, occlusion of the SFA stents as well as the SFA and the popliteal in its entirety.  There appears to be anterior tibial runoff however this does not appear to cross the ankle. Patient continues to smoke  which increases her risk for PAD Continue aspirin, Plavix and statin Pain control with Oxycodone Noninvasive physiologic vascular study showed severe right lower extremity arterial occlusive disease.  Unable to assess left lower extremity due to pain Appreciate vascular surgery input, plan for left lower extremity angiogram today       Left foot pain Concern for left foot cellulitis on admission Left heel ulcer Left foot x-ray shows a new lucency within the soft tissues distal and distal lateral to the fifth metatarsal stump and distal to the second metatarsal stump. No definitive new adjacent cortical erosion is seen. Re demonstration of ulceration of the distal posterior heel. No definitive cortical erosion is seen within the adjacent distal posterior calcaneus to indicate radiographic evidence of acute osteomyelitis. Appreciate podiatry input. 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 At this time she has an area of necrotic tissue distal left fifth metatarsal region with ulceration to the distal medial aspect as well as a pressure ulcer of the left heel with drainage. Will await vascular evaluation but I do not believe there is very much from a limb salvage standpoint. I doubt the necrotic ulceration on the distal lateral fifth metatarsal will heal and she would still have this large heel decubitus ulceration that will likely not heal. If they can establish better perfusion long term wound care can be performed. I do not recommend debridement of the left foot. Given her pain status I suspect she would be better off with a  below the knee or above-the-knee amputation" Continue empiric antibiotic therapy with Rocephin       Acute on chronic anemia History of iron deficiency anemia Hemoglobin on arrival 6.4.  Guaiac negative in ED.  No evidence of acute blood loss.  Vital signs stable.   Low MCV and high RDW indicative of iron  deficiency anemia.  Patient carries this diagnosis but is not adherent to home iron therapies. Patient has been transfused 1 unit of packed RBC with improvement in her H&H Monitor closely during this hospitalization       Facial zoster Present on right side of face.   Patient denies any visual disturbance. Rash in different stages of crusting Continue Valtrex Continue airborne and contact precautions     Chronic systolic diastolic congestive heart failure LVEF 40 to 45% Appears euvolemic at this time. Continue carvedilol and furosemide Will add Cozaar     COPD, unspecified type/severity Active smoker Patient started smoking at age 10.  Not ready to quit.  ' Smoking cessation was discussed with patient in detail Continue nicotine transdermal patch       Essential hypertension Improved blood pressure control" on Cozaar and carvedilol     Generalized anxiety disorder Depression Stable Continue sertraline and trazodone       Stage IIIa chronic kidney disease Stable Monitor renal function closely                Subjective: Continues to yell out due to severe rest pain involving the left foot  Physical Exam: Vitals:   03/23/23 1700 03/23/23 2115 03/24/23 0749 03/24/23 0835  BP: (!) 134/59 (!) 116/94 (!) 157/67 138/61  Pulse: (!) 105 (!) 101 (!) 106 95  Resp:   20   Temp:  99 F (37.2 C) 97.6 F (36.4 C)   TempSrc:  Oral Oral   SpO2:  97% 97%   Weight:      Height:       General: NAD.  Appears frail, chronically ill-appearing HEENT: Normocephalic, atraumatic Neck, supple, trachea midline, no tenderness Heart: Distant heart sounds.  No murmurs.  No pedal edema Lungs: Lungs are diminished.  Scattered crackles.  Normal work of breathing.  Room air Abdomen: Thin, soft NT/ND, normal bowel sounds Extremities: Left status post TMA.  Wound noted on distal aspect Skin: Wound on distal aspect of left foot stump Neurologic: Cranial nerves grossly intact,  sensation intact, alert and oriented x3 Psychiatric: Normal affect    Data Reviewed:  There are no new results to review at this time.  Family Communication: Plan of care discussed with patient in detail.  She verbalizes understanding and agrees with the plan.  Disposition: Status is: Inpatient Remains inpatient appropriate because: For left lower extremity angiogram today  Planned Discharge Destination:  TBD    Time spent: 35 minutes  Author: Lucile Shutters, MD 03/24/2023 11:29 AM  For on call review [email or web address].

## 2023-03-25 ENCOUNTER — Other Ambulatory Visit (INDEPENDENT_AMBULATORY_CARE_PROVIDER_SITE_OTHER): Payer: Self-pay | Admitting: Nurse Practitioner

## 2023-03-25 DIAGNOSIS — L03116 Cellulitis of left lower limb: Secondary | ICD-10-CM | POA: Diagnosis not present

## 2023-03-25 DIAGNOSIS — S91302A Unspecified open wound, left foot, initial encounter: Secondary | ICD-10-CM

## 2023-03-25 DIAGNOSIS — I998 Other disorder of circulatory system: Secondary | ICD-10-CM | POA: Diagnosis not present

## 2023-03-25 LAB — TYPE AND SCREEN
ABO/RH(D): O NEG
Antibody Screen: NEGATIVE
Unit division: 0
Unit division: 0
Unit division: 0

## 2023-03-25 LAB — BPAM RBC
Blood Product Expiration Date: 202411272359
Blood Product Expiration Date: 202412012359
Blood Product Expiration Date: 202412012359
ISSUE DATE / TIME: 202411191456
Unit Type and Rh: 9500
Unit Type and Rh: 9500
Unit Type and Rh: 9500

## 2023-03-25 NOTE — Progress Notes (Signed)
1 Day Post-Op   Subjective/Chief Complaint: Confused this morning. Easily reoriented. Eating breakfast upon my exam. States she has pain all over including her LEFT foot.   Objective: Vital signs in last 24 hours: Temp:  [97.9 F (36.6 C)-98.7 F (37.1 C)] 98 F (36.7 C) (11/23 0813) Pulse Rate:  [95-109] 109 (11/23 0813) Resp:  [12-28] 17 (11/23 0813) BP: (118-158)/(50-115) 123/59 (11/23 0813) SpO2:  [94 %-100 %] 94 % (11/23 0813) Last BM Date : 03/23/23  Intake/Output from previous day: 11/22 0701 - 11/23 0700 In: -  Out: 550 [Urine:550] Intake/Output this shift: No intake/output data recorded.  General appearance: cooperative and mild distress Cardio: regular rate and rhythm Extremities: LEFT TMA and heel ulcerations, foot cool, NO DP/PT; RIGHT groin access site- soft, leg/foot warm,   Lab Results:  No results for input(s): "WBC", "HGB", "HCT", "PLT" in the last 72 hours. BMET Recent Labs    03/23/23 0856  CREATININE 0.63   PT/INR No results for input(s): "LABPROT", "INR" in the last 72 hours. ABG No results for input(s): "PHART", "HCO3" in the last 72 hours.  Invalid input(s): "PCO2", "PO2"  Studies/Results: PERIPHERAL VASCULAR CATHETERIZATION  Result Date: 03/24/2023 See surgical note for result.   Anti-infectives: Anti-infectives (From admission, onward)    Start     Dose/Rate Route Frequency Ordered Stop   03/24/23 1045  ceFAZolin (ANCEF) IVPB 2g/100 mL premix  Status:  Discontinued        2 g 200 mL/hr over 30 Minutes Intravenous 30 min pre-op 03/24/23 1045 03/24/23 1453   03/22/23 1200  vancomycin (VANCOREADY) IVPB 750 mg/150 mL  Status:  Discontinued        750 mg 150 mL/hr over 60 Minutes Intravenous Every 24 hours 03/21/23 1535 03/23/23 1147   03/21/23 2200  valACYclovir (VALTREX) tablet 500 mg        500 mg Oral 2 times daily 03/21/23 1507     03/21/23 1600  cefTRIAXone (ROCEPHIN) 2 g in sodium chloride 0.9 % 100 mL IVPB        2 g 200  mL/hr over 30 Minutes Intravenous Every 24 hours 03/21/23 1506 03/27/23 1759   03/21/23 1515  vancomycin (VANCOCIN) IVPB 1000 mg/200 mL premix  Status:  Discontinued        1,000 mg 200 mL/hr over 60 Minutes Intravenous  Once 03/21/23 1506 03/21/23 1535   03/21/23 1215  vancomycin (VANCOCIN) IVPB 1000 mg/200 mL premix        1,000 mg 200 mL/hr over 60 Minutes Intravenous  Once 03/21/23 1210 03/21/23 1321       Assessment/Plan: s/p Procedure(s): Lower Extremity Angiography (Left) POD #1 S/P Percutaneous transluminal angioplasty of the right common iliac artery  Percutaneous transluminal angioplasty and stent placement right external iliac artery  ASA/Plavix Pain control The patient has no further revascularization options with progressive limb ischemia and severe knee contracture. Will likely require an AKA sometime next week.  LOS: 4 days    Bertram Denver 03/25/2023

## 2023-03-25 NOTE — Plan of Care (Signed)
  Problem: Fluid Volume: Goal: Hemodynamic stability will improve Outcome: Progressing   Problem: Clinical Measurements: Goal: Diagnostic test results will improve Outcome: Progressing Goal: Signs and symptoms of infection will decrease Outcome: Progressing   Problem: Respiratory: Goal: Ability to maintain adequate ventilation will improve Outcome: Progressing   Problem: Education: Goal: Knowledge of General Education information will improve Description: Including pain rating scale, medication(s)/side effects and non-pharmacologic comfort measures Outcome: Progressing   Problem: Health Behavior/Discharge Planning: Goal: Ability to manage health-related needs will improve Outcome: Progressing   Problem: Clinical Measurements: Goal: Ability to maintain clinical measurements within normal limits will improve Outcome: Progressing Goal: Will remain free from infection Outcome: Progressing Goal: Diagnostic test results will improve Outcome: Progressing Goal: Respiratory complications will improve Outcome: Progressing Goal: Cardiovascular complication will be avoided Outcome: Progressing   Problem: Activity: Goal: Risk for activity intolerance will decrease Outcome: Progressing   Problem: Nutrition: Goal: Adequate nutrition will be maintained Outcome: Progressing   Problem: Coping: Goal: Level of anxiety will decrease Outcome: Progressing   Problem: Elimination: Goal: Will not experience complications related to bowel motility Outcome: Progressing Goal: Will not experience complications related to urinary retention Outcome: Progressing   Problem: Pain Management: Goal: General experience of comfort will improve Outcome: Progressing   Problem: Safety: Goal: Ability to remain free from injury will improve Outcome: Progressing   Problem: Skin Integrity: Goal: Risk for impaired skin integrity will decrease Outcome: Progressing   Problem: Education: Goal:  Understanding of CV disease, CV risk reduction, and recovery process will improve Outcome: Progressing Goal: Individualized Educational Video(s) Outcome: Progressing   Problem: Activity: Goal: Ability to return to baseline activity level will improve Outcome: Progressing   Problem: Cardiovascular: Goal: Ability to achieve and maintain adequate cardiovascular perfusion will improve Outcome: Progressing Goal: Vascular access site(s) Level 0-1 will be maintained Outcome: Progressing   Problem: Health Behavior/Discharge Planning: Goal: Ability to safely manage health-related needs after discharge will improve Outcome: Progressing

## 2023-03-25 NOTE — Plan of Care (Signed)
  Problem: Clinical Measurements: Goal: Respiratory complications will improve Outcome: Progressing   Problem: Elimination: Goal: Will not experience complications related to bowel motility Outcome: Progressing Goal: Will not experience complications related to urinary retention Outcome: Progressing   Problem: Activity: Goal: Risk for activity intolerance will decrease Outcome: Not Progressing   Problem: Coping: Goal: Level of anxiety will decrease Outcome: Not Progressing   Problem: Pain Management: Goal: General experience of comfort will improve Outcome: Not Progressing   Problem: Skin Integrity: Goal: Risk for impaired skin integrity will decrease Outcome: Not Progressing   Problem: Activity: Goal: Ability to return to baseline activity level will improve Outcome: Not Progressing   Problem: Health Behavior/Discharge Planning: Goal: Ability to safely manage health-related needs after discharge will improve Outcome: Not Progressing

## 2023-03-25 NOTE — Progress Notes (Addendum)
Progress Note   [name]:[id number] DOB: 1945/10/31 DOA: 03/21/2023     4 DOS: the patient was seen and examined on 03/25/2023   Brief hospital course:  Michele Matthews is a 77 y.o. female with medical history significant of hypertension, combined systolic and diastolic heart failure, COPD, hyperlipidemia, tobacco dependence who presents to the ED with chief complaint of left foot pain.  Patient states that she underwent a transmetatarsal amputation several years ago but it never quite healed properly.  2 weeks prior to presentation to [facility] noted increasing pain and a wound to the left foot.  Pain is gradually increased.  No fevers chills.  No nausea or vomiting.  Patient also endorsed a rash on the right side of her face consistent with shingles outbreak.  She denies pain or pruritus from this rash but states that she knows its there and is compelled to touch it.   On my evaluation patient is resting in bed.  She is in no distress but reports improvement after administration of pain medication.  Vital signs are stable.  Laboratory investigation significant for anemia with hemoglobin 6.4.  Patient did have mildly elevated lactic acid which improved after administration of intravenous fluids.     ED Course: Patient was given intravenous fluids, pain medication, IV antibiotics after culture data was taken.  Hospitalist contacted to admit under working diagnosis of sepsis secondary to infection cellulitis of left foot.     Assessment and Plan:   Left foot rest pain Known history of PAD Status post left TMA Patient presented to the ER for evaluation of worsening left foot pain S/P LLE angiogram which was done 02/24,  Diffuse atherosclerotic changes of the left lower extremity, occlusion of the SFA stents as well as the SFA and the popliteal in its entirety.  There appears to be anterior tibial runoff however this does not appear to cross the ankle. 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  Unable to assess left lower extremity due to pain Appreciate vascular surgery input, patient is status post left lower extremity angiography and is status post percutaneous transluminal angioplasty of the right common iliac artery as well as percutaneous transluminal angioplasty and stent placement right external iliac artery Per vascular surgery "the patient has no further revascularization options with progressive limb ischemia and severe knee contracture. Will likely require an AKA sometime next week".  Continue aspirin, Plavix and statin Pain control with Oxycodone      Left foot pain Concern for left foot cellulitis on admission Left heel ulcer Left foot x-ray shows a new lucency within the soft tissues distal and distal lateral to the fifth metatarsal stump and distal to the second metatarsal stump. No definitive new adjacent cortical erosion is seen. Re demonstration of ulceration of the distal posterior heel. No definitive cortical erosion is seen within the adjacent distal posterior calcaneus to indicate radiographic evidence of acute osteomyelitis. Appreciate podiatry input. 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 At this time she has an area of necrotic tissue distal left fifth metatarsal region with ulceration to the distal medial aspect as well as a pressure ulcer of the left heel with drainage. Will await vascular evaluation but I do not believe there is very much from a limb salvage standpoint. I doubt the necrotic ulceration on the distal lateral fifth metatarsal will heal and she would still have this large heel  decubitus ulceration that will likely not heal. If they can establish better perfusion long term wound care can be performed. I do not recommend debridement of the left foot. Given her pain status I suspect she would be better off with a  below the knee or above-the-knee amputation" Continue empiric antibiotic therapy with Rocephin to complete a 5 day course of therapy.       Acute on chronic anemia History of iron deficiency anemia Hemoglobin on arrival 6.4.  Guaiac negative in ED.  No evidence of acute blood loss.  Vital signs stable.   Low MCV and high RDW indicative of iron deficiency anemia.  Patient carries this diagnosis but is not adherent to home iron therapies. Patient has been transfused 1 unit of packed RBC with improvement in her H&H Monitor closely during this hospitalization       Facial zoster Present on right side of face.   Patient denies any visual disturbance. Rash has crusted over and is in different stages of healing Continue Valtrex Continue airborne and contact precautions       Chronic systolic diastolic congestive heart failure LVEF 40 to 45% Appears euvolemic at this time. Continue carvedilol and furosemide Continue Cozaar     COPD, unspecified type/severity Active smoker Patient started smoking at age 2.  Not ready to quit.  ' Smoking cessation was discussed with patient in detail Continue nicotine transdermal patch       Essential hypertension Improved blood pressure control" on Cozaar and carvedilol     Generalized anxiety disorder Depression Stable Continue sertraline and trazodone       Stage IIIa chronic kidney disease Stable Monitor renal function closely            Subjective: Continues to complain of pain in her left foot.  Physical Exam: Vitals:   03/24/23 1951 03/25/23 0043 03/25/23 0340 03/25/23 0813  BP: 118/68 (!) 129/57 126/71 (!) 123/59  Pulse: (!) 109 100 99 (!) 109  Resp: 18 18 18 17   Temp: 98 F (36.7 C) 98 F (36.7 C) 98.1 F (36.7 C) 98 F (36.7 C)  TempSrc: Oral Oral Oral   SpO2: 100% 100% 100% 94%  Weight:      Height:       General: NAD.  Appears frail, chronically ill-appearing HEENT: Normocephalic, atraumatic Neck,  supple, trachea midline, no tenderness Heart: Distant heart sounds.  No murmurs.  No pedal edema Lungs: Lungs are diminished.  Scattered crackles.  Normal work of breathing.  Room air Abdomen: Thin, soft NT/ND, normal bowel sounds Extremities: Left status post TMA.  Wound noted on distal aspect Skin: Wound on distal aspect of left foot stump Neurologic: Cranial nerves grossly intact, sensation intact, alert and oriented x3 Psychiatric: Normal affect   Data Reviewed:  There are no new results to review at this time.  Family Communication: Plan of care discussed with patient's husband over the phone.  All questions and concerns have been addressed.  He verbalizes understanding and agrees with the plan.  Disposition: Status is: Inpatient Remains inpatient appropriate because: Needs an AKA  Planned Discharge Destination:  TBD    Time spent: 35 minutes  Author: Lucile Shutters, MD 03/25/2023 11:23 AM  For on call review [email or web address].

## 2023-03-25 NOTE — TOC Initial Note (Addendum)
Transition of Care [facility]) - Initial/Assessment Note    Patient Details  Name: Michele Matthews MRN: [id number] Date of Birth: [birth year]  Transition of Care [facility]) CM/SW Contact:    Liliana Cline, LCSW Phone Number: 03/25/2023, 1:58 PM  Clinical Narrative:                 Patient is on isolation and not fully oriented per chart review. Called patient's spouse to discuss SNF rec from 11/20. Patient's spouse states they do not want SNF. They did not have a good experience in the past with SNF. He states he plans to bring patient home and he will provide 24 hour care at home. He states he feels comfortable caring for patient at home, and has lots of family support. He states patient has needed DME at home. He states they are agreeable to home health and had [facility] in the past. Referral made to [facility] with [facility] for PT, OT, RN, and Aide. Patient's husband reports she does not have a PCP. Explained that this is needed for home health. He agreed to call Monday to get patient established with a PCP new patient appointment. He declines PCP resource needs. Reached out to MDs working with patient to see if they can cover HH orders until she gets an established PCP. Sheppard Plumber NP (with Dr. Gilda Crease) states they can cover [facility] orders until patient gets established with a PCP.   Expected Discharge Plan: Home w Home Health Services Barriers to Discharge: Continued Medical Work up   Patient Goals and CMS Choice Patient states their goals for this hospitalization and ongoing recovery are:: declines SNF CMS Medicare.gov Compare Post Acute Care list provided to:: Patient Represenative (must comment) Choice offered to / list presented to : Spouse      Expected Discharge Plan and Services       Living arrangements for the past 2 months: Single Family Home                           HH Arranged: PT, OT, RN, Nurse's Aide HH Agency: [facility] Date [facility] Agency Contacted: 03/25/23    Representative spoke with at [facility] Agency: Laurelyn Sickle  Prior Living Arrangements/Services Living arrangements for the past 2 months: Single Family Home Lives with:: Spouse Patient language and need for interpreter reviewed:: Yes Do you feel safe going back to the place where you live?: Yes      Need for Family Participation in Patient Care: Yes (Comment) Care giver support system in place?: Yes (comment) Current home services: DME Criminal Activity/Legal Involvement Pertinent to Current Situation/Hospitalization: No - Comment as needed  Activities of Daily Living   ADL Screening (condition at time of admission) Independently performs ADLs?: No Does the patient have a NEW difficulty with bathing/dressing/toileting/self-feeding that is expected to last >3 days?: Yes (Initiates electronic notice to provider for possible OT consult) Does the patient have a NEW difficulty with getting in/out of bed, walking, or climbing stairs that is expected to last >3 days?: Yes (Initiates electronic notice to provider for possible PT consult) Does the patient have a NEW difficulty with communication that is expected to last >3 days?: No Is the patient deaf or have difficulty hearing?: Yes Does the patient have difficulty seeing, even when wearing glasses/contacts?: No Does the patient have difficulty concentrating, remembering, or making decisions?: No  Permission Sought/Granted Permission sought to share information with : Oceanographer  granted to share information with : Yes, Verbal Permission Granted     Permission granted to share info w AGENCY: [facility]        Emotional Assessment       Orientation: : Fluctuating Orientation (Suspected and/or reported Sundowners) Alcohol / Substance Use: Not Applicable Psych Involvement: No (comment)  Admission diagnosis:  Foot pain, left [M79.672] Sepsis (HCC) [A41.9] Anemia, unspecified type [D64.9] Cellulitis, unspecified  cellulitis site [L03.90] Patient Active Problem List   Diagnosis Date Noted   Ischemia of left lower extremity 03/22/2023   Foot pain, left 03/22/2023   Cellulitis 03/22/2023   Sepsis (HCC) 03/21/2023   Hemiparesis affecting left side as late effect of cerebrovascular accident (CVA) (HCC) 05/29/2022   PAD (peripheral artery disease) (HCC) 05/26/2022   HLD (hyperlipidemia) 05/26/2022   Depression with anxiety 05/26/2022   Leg edema, right 05/26/2022   UTI (urinary tract infection) 09/05/2021   Chronic combined systolic and diastolic heart failure (HCC) 09/05/2021   Hypokalemia 11/28/2020   Hyponatremia 11/28/2020   Left foot infection 11/25/2020   Chronic combined systolic and diastolic CHF (congestive heart failure) (HCC) 07/24/2017   Severe protein-calorie malnutrition (HCC) 04/01/2016   Anemia, iron deficiency    Pressure ulcer 12/27/2015   Essential hypertension 12/27/2015   Anemia 10/26/2015   COPD (chronic obstructive pulmonary disease) (HCC) 06/19/2015   Coronary artery disease 06/19/2015   GERD (gastroesophageal reflux disease) 06/19/2015   Hyperlipidemia, unspecified 06/19/2015   Long term current use of anticoagulant therapy 06/19/2015   Anxiety and depression 06/19/2015   Symptomatic anemia 12/10/2014   Upper GI bleed    History of CVA with residual deficit 12/10/2013   Hemianopsia 12/10/2013   Left-sided weakness 12/10/2013   Tobacco abuse 12/10/2013   PCP:  Pcp, No Pharmacy:   [facility] [place], [place] - [address] [place] [place] [address] Phone: [phone] Fax: [phone]  [facility] - [place], [place] - [address] [place] [address] Phone: [phone] Fax: [phone]  [facility] - [place], [place] - [address] [place] [address] Phone: [phone] Fax: [phone]     Social Determinants of Health  (SDOH) Social History: SDOH Screenings   Food Insecurity: No Food Insecurity (03/21/2023)  Housing: Low Risk  (03/21/2023)  Transportation Needs: No Transportation Needs (03/21/2023)  Utilities: Not At Risk (03/21/2023)  Financial Resource Strain: Medium Risk (11/08/2021)  Tobacco Use: High Risk (03/21/2023)   SDOH Interventions:     Readmission Risk Interventions    06/14/2022    8:47 AM  Readmission Risk Prevention Plan  Transportation Screening Complete  Medication Review (RN Care Manager) Complete  PCP or Specialist appointment within 3-5 days of discharge Complete  HRI or Home Care Consult Complete  SW Recovery Care/Counseling Consult Complete  Palliative Care Screening Not Applicable  Skilled Nursing Facility Not Applicable

## 2023-03-25 NOTE — Progress Notes (Signed)
Physical Therapy Treatment Patient Details Name: Michele Matthews MRN: [id number] DOB: [birth year] Today's Date: 03/25/2023   History of Present Illness Pt is a 77 y.o. female with medical history significant of HTN, combined systolic and diastolic heart failure, COPD, HLD, tobacco dependence, presenting with c/c of L foot pain, previous L transmet amputation (2022). MD assessment includes PAD, L foot cellulitis with heel ulcer, acute on chronic anemia, facial zoster, anxiety disorder, depression, and CKD.    PT Comments  Pt was sleeping soundly upon arrival. She does easily awake but remains somewhat lethargic throughout. Pt did know she was in the hospital but overall awareness of situation was poor. She did not remember speaking to a MD about her leg healing/ lack of healing. Pt stated several times,"It hurts worse today than yesterday." Rated pain 8/10. She was unwilling to attempt OOB but did participate in bed level exercises. See exercises listed below. Pt severely limited overall. Will continue to follow and monitor POC with respects to further amputations. Pt will benefit form post acute PT to maximize her independence and safety with all ADLs. DC recs remain appropriate.      If plan is discharge home, recommend the following: A lot of help with walking and/or transfers;A lot of help with bathing/dressing/bathroom;Assistance with cooking/housework;Assist for transportation;Help with stairs or ramp for entrance     Equipment Recommendations  Other (comment) (defer to next level of care)       Precautions / Restrictions Precautions Precautions: Fall Restrictions Weight Bearing Restrictions: Yes LLE Weight Bearing: Weight bearing as tolerated     Mobility  Bed Mobility  General bed mobility comments: not formally tested due to pain complains. see exercises listed below       Cognition Arousal: Lethargic Behavior During Therapy: WFL for tasks assessed/performed Overall Cognitive  Status: Within Functional Limits for tasks assessed    General Comments: Pt was asleep upon arrival but easily awakes. C/O severe pain and requested not to get OOB. did fully participate in exercises in bed. per chart review, expect further amputation next week           General Comments General comments (skin integrity, edema, etc.): pt was unwilling to get OOB but did perform BLE SLR, ankle pumps, quad sets, bridging (as able) , heel slides RLE, and BUE manually resisted exercises. pt remains severely limted by pain and weakness.      Pertinent Vitals/Pain Pain Assessment Pain Assessment: 0-10 Pain Score: 8  Faces Pain Scale: Hurts whole lot Pain Location: L foot, heel and forefoot Pain Descriptors / Indicators: Constant, Grimacing, Tender, Moaning Pain Intervention(s): Limited activity within patient's tolerance, Monitored during session, Repositioned, Patient requesting pain meds-RN notified     PT Goals (current goals can now be found in the care plan section) Acute Rehab PT Goals Patient Stated Goal: decrease pain, return to PLOF Progress towards PT goals: Not progressing toward goals - comment    Frequency    Min 1X/week       AM-PAC PT "6 Clicks" Mobility   Outcome Measure  Help needed turning from your back to your side while in a flat bed without using bedrails?: A Lot Help needed moving from lying on your back to sitting on the side of a flat bed without using bedrails?: A Lot Help needed moving to and from a bed to a chair (including a wheelchair)?: A Lot Help needed standing up from a chair using your arms (e.g., wheelchair or bedside chair)?: A Lot Help  needed to walk in hospital room?: A Lot Help needed climbing 3-5 steps with a railing? : Total 6 Click Score: 11    End of Session   Activity Tolerance: Patient limited by pain;Patient limited by lethargy;Patient limited by fatigue Patient left: in bed;with bed alarm set;with call bell/phone within  reach;with nursing/sitter in room Nurse Communication: Mobility status PT Visit Diagnosis: Difficulty in walking, not elsewhere classified (R26.2);Pain;Muscle weakness (generalized) (M62.81) Pain - Right/Left: Left Pain - part of body: Ankle and joints of foot     Time: 4696-2952 PT Time Calculation (min) (ACUTE ONLY): 13 min  Charges:    $Therapeutic Exercise: 8-22 mins PT General Charges $$ ACUTE PT VISIT: 1 Visit                     Jetta Lout PTA 03/25/23, 1:06 PM

## 2023-03-26 DIAGNOSIS — A419 Sepsis, unspecified organism: Secondary | ICD-10-CM | POA: Diagnosis not present

## 2023-03-26 DIAGNOSIS — R6520 Severe sepsis without septic shock: Secondary | ICD-10-CM | POA: Diagnosis not present

## 2023-03-26 LAB — CULTURE, BLOOD (ROUTINE X 2)
Culture: NO GROWTH
Culture: NO GROWTH
Special Requests: ADEQUATE
Special Requests: ADEQUATE

## 2023-03-26 MED ORDER — OXYCODONE HCL 5 MG PO TABS
5.0000 mg | ORAL_TABLET | Freq: Four times a day (QID) | ORAL | Status: DC | PRN
Start: 2023-03-26 — End: 2023-03-29
  Administered 2023-03-26 – 2023-03-27 (×3): 10 mg via ORAL
  Administered 2023-03-27: 5 mg via ORAL
  Administered 2023-03-27 – 2023-03-28 (×3): 10 mg via ORAL
  Administered 2023-03-28: 5 mg via ORAL
  Filled 2023-03-26 (×9): qty 2

## 2023-03-26 MED ORDER — POLYETHYLENE GLYCOL 3350 17 G PO PACK
17.0000 g | PACK | Freq: Every day | ORAL | Status: DC
  Administered 2023-03-26 – 2023-04-03 (×8): 17 g via ORAL
  Filled 2023-03-26 (×8): qty 1

## 2023-03-26 NOTE — Progress Notes (Signed)
Progress Note   [name] JXB:[id number] DOB: 1945-12-15 DOA: 03/21/2023     5 DOS: the patient was seen and examined on 03/26/2023   Brief hospital course:  Michele Matthews is a 77 y.o. female with medical history significant of hypertension, combined systolic and diastolic heart failure, COPD, hyperlipidemia, tobacco dependence who presents to the ED with chief complaint of left foot pain.  Patient states that she underwent a transmetatarsal amputation several years ago but it never quite healed properly.  2 weeks prior to presentation to [facility] noted increasing pain and a wound to the left foot.  Pain is gradually increased.  No fevers chills.  No nausea or vomiting.  Patient also endorsed a rash on the right side of her face consistent with shingles outbreak.  She denies pain or pruritus from this rash but states that she knows its there and is compelled to touch it.   On my evaluation patient is resting in bed.  She is in no distress but reports improvement after administration of pain medication.  Vital signs are stable.  Laboratory investigation significant for anemia with hemoglobin 6.4.  Patient did have mildly elevated lactic acid which improved after administration of intravenous fluids.     ED Course: Patient was given intravenous fluids, pain medication, IV antibiotics after culture data was taken.  Hospitalist contacted to admit under working diagnosis of sepsis secondary to infection cellulitis of left foot.     Assessment and Plan:   Left foot rest pain Known history of PAD Status post left TMA Patient presented to the ER for evaluation of worsening left foot pain S/P LLE angiogram which was done 02/24,  Diffuse atherosclerotic changes of the left lower extremity, occlusion of the SFA stents as well as the SFA and the popliteal in its entirety.  There appears to be anterior tibial runoff however this does not appear to cross the ankle. 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  Unable to assess left lower extremity due to pain Appreciate vascular surgery input, patient is status post left lower extremity angiography and is status post percutaneous transluminal angioplasty of the right common iliac artery as well as percutaneous transluminal angioplasty and stent placement right external iliac artery Per vascular surgery "the patient has no further revascularization options with progressive limb ischemia and severe knee contracture. Will likely require an AKA sometime next week".  Continue aspirin, Plavix and statin Pain control with Oxycodone, will increase dose today, add miralax to bowel regimen      Left foot pain Concern for left foot cellulitis on admission Left heel ulcer Left foot x-ray shows a new lucency within the soft tissues distal and distal lateral to the fifth metatarsal stump and distal to the second metatarsal stump. No definitive new adjacent cortical erosion is seen. Re demonstration of ulceration of the distal posterior heel. No definitive cortical erosion is seen within the adjacent distal posterior calcaneus to indicate radiographic evidence of acute osteomyelitis. Appreciate podiatry input. 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 At this time she has an area of necrotic tissue distal left fifth metatarsal region with ulceration to the distal medial aspect as well as a pressure ulcer of the left heel with drainage. Will await vascular evaluation but I do not believe there is very much from a limb salvage standpoint. I doubt the necrotic ulceration on the distal lateral fifth metatarsal will  heal and she would still have this large heel decubitus ulceration that will likely not heal. If they can establish better perfusion long term wound care can be performed. I do not recommend debridement of the left foot. Given  her pain status I suspect she would be better off with a below the knee or above-the-knee amputation" Continue empiric antibiotic therapy with Rocephin to complete a 5 day course of therapy.     Acute on chronic anemia History of iron deficiency anemia Hemoglobin on arrival 6.4.  Guaiac negative in ED.  No evidence of acute blood loss.  Vital signs stable.   Low MCV and high RDW indicative of iron deficiency anemia.  Patient carries this diagnosis but is not adherent to home iron therapies. Patient has been transfused 1 unit of packed RBC with improvement in her H&H No labs since, will order hgb for tomorrow     Facial zoster Present on right side of face.   Patient denies any visual disturbance. Rash has crusted over and is in different stages of healing Continue Valtrex Continue airborne and contact precautions     Chronic systolic diastolic congestive heart failure LVEF 40 to 45% Appears euvolemic at this time. Continue carvedilol and furosemide Continue Cozaar     COPD, unspecified type/severity Active smoker Patient started smoking at age 28.  Not ready to quit.  ' Smoking cessation was discussed with patient in detail Continue nicotine transdermal patch     Essential hypertension Improved blood pressure control" on Cozaar and carvedilol     Generalized anxiety disorder Depression Stable Continue sertraline and trazodone       Stage IIIa chronic kidney disease Stable Monitor renal function closely            Subjective: Continues to complain of pain in her left foot.tolerating diet. Last bm two days ago per patient  Physical Exam: Vitals:   03/25/23 1553 03/25/23 1706 03/25/23 2316 03/26/23 0744  BP: (!) 102/46 (!) 122/48 (!) 140/64 125/63  Pulse: 86  96 (!) 102  Resp: 16  18 18   Temp: 98.2 F (36.8 C)  98.3 F (36.8 C) 98.3 F (36.8 C)  TempSrc: Oral  Axillary Oral  SpO2: 97%  98% 95%  Weight:      Height:       General: NAD.  Appears  frail, chronically ill-appearing HEENT: Normocephalic, atraumatic Heart: rrr, no murmur Lungs: ctab Abdomen: soft, non-tender Extremities: Left status post TMA.  Ulceration distal amputation. No surrounding erythema or drainage Neurologic: moving all 4, hard of hearing Psychiatric: Normal affect   Data Reviewed:  There are no new results to review at this time.  Family Communication: husband updated telephonically 11/24  Disposition: Status is: Inpatient Remains inpatient appropriate because: Needs an AKA  Planned Discharge Destination: home with home health (patient/husband decline snf)    Time spent: 35 minutes  Author: Silvano Bilis, MD 03/26/2023 1:31 PM  For on call review [email or web address].

## 2023-03-26 NOTE — Plan of Care (Signed)
  Problem: Fluid Volume: Goal: Hemodynamic stability will improve Outcome: Progressing   Problem: Clinical Measurements: Goal: Diagnostic test results will improve Outcome: Progressing Goal: Signs and symptoms of infection will decrease Outcome: Progressing   Problem: Respiratory: Goal: Ability to maintain adequate ventilation will improve Outcome: Progressing   Problem: Education: Goal: Knowledge of General Education information will improve Description: Including pain rating scale, medication(s)/side effects and non-pharmacologic comfort measures Outcome: Progressing   Problem: Health Behavior/Discharge Planning: Goal: Ability to manage health-related needs will improve Outcome: Progressing   Problem: Clinical Measurements: Goal: Ability to maintain clinical measurements within normal limits will improve Outcome: Progressing Goal: Will remain free from infection Outcome: Progressing Goal: Diagnostic test results will improve Outcome: Progressing Goal: Respiratory complications will improve Outcome: Progressing Goal: Cardiovascular complication will be avoided Outcome: Progressing   Problem: Activity: Goal: Risk for activity intolerance will decrease Outcome: Progressing   Problem: Nutrition: Goal: Adequate nutrition will be maintained Outcome: Progressing   Problem: Coping: Goal: Level of anxiety will decrease Outcome: Progressing   Problem: Elimination: Goal: Will not experience complications related to bowel motility Outcome: Progressing Goal: Will not experience complications related to urinary retention Outcome: Progressing   Problem: Pain Management: Goal: General experience of comfort will improve Outcome: Progressing   Problem: Safety: Goal: Ability to remain free from injury will improve Outcome: Progressing   Problem: Skin Integrity: Goal: Risk for impaired skin integrity will decrease Outcome: Progressing   Problem: Education: Goal:  Understanding of CV disease, CV risk reduction, and recovery process will improve Outcome: Progressing Goal: Individualized Educational Video(s) Outcome: Progressing   Problem: Activity: Goal: Ability to return to baseline activity level will improve Outcome: Progressing   Problem: Cardiovascular: Goal: Ability to achieve and maintain adequate cardiovascular perfusion will improve Outcome: Progressing Goal: Vascular access site(s) Level 0-1 will be maintained Outcome: Progressing   Problem: Health Behavior/Discharge Planning: Goal: Ability to safely manage health-related needs after discharge will improve Outcome: Progressing

## 2023-03-27 ENCOUNTER — Encounter: Payer: Self-pay | Admitting: Vascular Surgery

## 2023-03-27 ENCOUNTER — Other Ambulatory Visit: Payer: Self-pay

## 2023-03-27 DIAGNOSIS — I998 Other disorder of circulatory system: Secondary | ICD-10-CM | POA: Diagnosis not present

## 2023-03-27 DIAGNOSIS — M79672 Pain in left foot: Secondary | ICD-10-CM | POA: Diagnosis not present

## 2023-03-27 LAB — CBC
HCT: 23.6 % — ABNORMAL LOW (ref 36.0–46.0)
Hemoglobin: 6.7 g/dL — ABNORMAL LOW (ref 12.0–15.0)
MCH: 19.3 pg — ABNORMAL LOW (ref 26.0–34.0)
MCHC: 28.4 g/dL — ABNORMAL LOW (ref 30.0–36.0)
MCV: 67.8 fL — ABNORMAL LOW (ref 80.0–100.0)
Platelets: 280 10*3/uL (ref 150–400)
RBC: 3.48 MIL/uL — ABNORMAL LOW (ref 3.87–5.11)
RDW: 25.1 % — ABNORMAL HIGH (ref 11.5–15.5)
WBC: 11.2 10*3/uL — ABNORMAL HIGH (ref 4.0–10.5)
nRBC: 0 % (ref 0.0–0.2)

## 2023-03-27 LAB — BASIC METABOLIC PANEL
Anion gap: 8 (ref 5–15)
BUN: 20 mg/dL (ref 8–23)
CO2: 27 mmol/L (ref 22–32)
Calcium: 8.9 mg/dL (ref 8.9–10.3)
Chloride: 97 mmol/L — ABNORMAL LOW (ref 98–111)
Creatinine, Ser: 0.73 mg/dL (ref 0.44–1.00)
GFR, Estimated: >60 mL/min (ref >60)
Glucose, Bld: 93 mg/dL (ref 70–99)
Potassium: 4 mmol/L (ref 3.5–5.1)
Sodium: 132 mmol/L — ABNORMAL LOW (ref 135–145)

## 2023-03-27 LAB — RETICULOCYTES
Immature Retic Fract: 37.4 % — ABNORMAL HIGH (ref 2.3–15.9)
RBC.: 3.49 MIL/uL — ABNORMAL LOW (ref 3.87–5.11)
Retic Count, Absolute: 49.6 10*3/uL (ref 19.0–186.0)
Retic Ct Pct: 1.4 % (ref 0.4–3.1)

## 2023-03-27 LAB — HEMOGLOBIN AND HEMATOCRIT, BLOOD
HCT: 23 % — ABNORMAL LOW (ref 36.0–46.0)
Hemoglobin: 6.6 g/dL — ABNORMAL LOW (ref 12.0–15.0)

## 2023-03-27 LAB — PREPARE RBC (CROSSMATCH)

## 2023-03-27 MED ORDER — CHLORHEXIDINE GLUCONATE CLOTH 2 % EX PADS
6.0000 | MEDICATED_PAD | Freq: Every day | CUTANEOUS | Status: DC
  Administered 2023-03-28 – 2023-04-03 (×6): 6 via TOPICAL

## 2023-03-27 MED ORDER — SODIUM CHLORIDE 0.9% IV SOLUTION: Freq: Once | INTRAVENOUS | Status: AC

## 2023-03-27 MED ORDER — SODIUM CHLORIDE 0.9% FLUSH: 10.0000 mL | INTRAVENOUS | Status: DC | PRN

## 2023-03-27 MED ORDER — SODIUM CHLORIDE 0.9% FLUSH
10.0000 mL | Freq: Two times a day (BID) | INTRAVENOUS | Status: DC
  Administered 2023-03-27 – 2023-04-03 (×10): 10 mL

## 2023-03-27 NOTE — Progress Notes (Signed)
Progress Note    03/27/2023 3:29 PM 3 Days Post-Op  Subjective:  Michele Matthews is a 77 yo female who presents to [facility] Emergency room with chief complaint of left foot pain. Patient states that she underwent a transmetatarsal amputation several years ago but it never quite healed properly. 2 weeks prior to presentation to [facility] noted increasing pain and a wound to the left foot. Pain is gradually increased.   On 03/24/23 patient underwent abdominal aortogram with right and left  lower extremity angiogram with angioplasty of the right common iliac artery and angioplasty with stent placement to the right external iliac artery.  Patient is left lower extremity demonstrates a previous femoral/tibial bypass that is occluded with the SFA occluded throughout its length as it is the entire popliteal artery.  The tibioperoneal trunk is also occluded with the posterior tibial and anterior tibial arteries occluded.  There was no visible perfusion of the foot although the patient is moving her lower extremity.  On exam today the patient remains in quite some pain to her left lower extremity due to noted ischemia.  Plan is to take the patient to the operating room for a left lower extremity above-the-knee amputation.   Vitals:   03/26/23 2335 03/27/23 0744  BP: 109/65 135/62  Pulse: (!) 107 90  Resp: 20 18  Temp: 98.8 F (37.1 C) 97.8 F (36.6 C)  SpO2: 93% 96%   Physical Exam: Cardiac:  RRR, Normal S1, S2. No murmurs. Lungs:  normal non-labored breathing, without Rales, rhonchi, wheezing  Incisions:  None Extremities:  Unable to palpate Left Popliteal, Dorsalis Pedis, and post tibial pulses. Open and draining transmetatarsal incision and heel. Left lower extremity with ischemic changes, without Gangrene , with cellulitis; with open wounds.  Abdomen: Positive bowel sounds throughout, soft, nontender nondistended. Neurologic: Patient is very hard of hearing.  Alert and oriented to person place and  time.  Follows commands appropriately.  CBC    Component Value Date/Time   WBC 11.2 (H) 03/27/2023 0423   RBC 3.48 (L) 03/27/2023 0423   RBC 3.49 (L) 03/27/2023 0423   HGB 6.7 (L) 03/27/2023 0423   HGB 12.1 03/27/2014 1730   HCT 23.6 (L) 03/27/2023 0423   HCT 37.0 03/27/2014 1730   PLT 280 03/27/2023 0423   PLT 175 03/27/2014 1730   MCV 67.8 (L) 03/27/2023 0423   MCV 97 03/27/2014 1730   MCH 19.3 (L) 03/27/2023 0423   MCHC 28.4 (L) 03/27/2023 0423   RDW 25.1 (H) 03/27/2023 0423   RDW 13.3 03/27/2014 1730   LYMPHSABS 0.8 03/21/2023 1121   LYMPHSABS 1.9 11/25/2013 0401   MONOABS 0.7 03/21/2023 1121   MONOABS 0.7 11/25/2013 0401   EOSABS 0.1 03/21/2023 1121   EOSABS 0.2 11/25/2013 0401   BASOSABS 0.0 03/21/2023 1121   BASOSABS 0.0 11/25/2013 0401    BMET    Component Value Date/Time   NA 132 (L) 03/27/2023 0423   NA 140 03/28/2014 0441   K 4.0 03/27/2023 0423   K 4.0 03/28/2014 0441   CL 97 (L) 03/27/2023 0423   CL 110 (H) 03/28/2014 0441   CO2 27 03/27/2023 0423   CO2 24 03/28/2014 0441   GLUCOSE 93 03/27/2023 0423   GLUCOSE 86 03/28/2014 0441   BUN 20 03/27/2023 0423   BUN 19 (H) 03/28/2014 0441   CREATININE 0.73 03/27/2023 0423   CREATININE 0.79 11/24/2015 0908   CALCIUM 8.9 03/27/2023 0423   CALCIUM 8.7 03/28/2014 0441   GFRNONAA >60  03/27/2023 0423   GFRNONAA 76 11/24/2015 0908   GFRAA >60 07/28/2017 0507   GFRAA 87 11/24/2015 0908    INR    Component Value Date/Time   INR 1.0 05/26/2022 1354   INR 2.0 03/30/2014 0336     Intake/Output Summary (Last 24 hours) at 03/27/2023 1529 Last data filed at 03/26/2023 2030 Gross per 24 hour  Intake 240 ml  Output 200 ml  Net 40 ml     Assessment/Plan:  77 y.o. female is s/p abdominal aortogram with bilateral lower extremity angiogram.  3 Days Post-Op   Vascular surgery plans on taking the patient to the operating room on 03/29/2023 for a left above-the-knee amputation due to widespread extensive  ischemia.  I had a long detailed discussion with the patient, her husband and her daughter at the bedside today.  I discussed in detail the procedure, benefits, risks, and complications.  They all verbalized her understanding and wished to proceed as soon as possible.  I answered all their questions today.  Patient will remain n.p.o. after midnight on Wednesday for the operating room.  I discussed the plan in detail with Dr. Levora Dredge MD and he agrees with the plan.  DVT prophylaxis: ASA 81 mg daily, Plavix 75 mg daily.   Marcie Bal [facility] 03/27/2023 3:29 PM

## 2023-03-27 NOTE — Progress Notes (Signed)
OT Cancellation Note  Patient Details Name: Michele Matthews MRN: [id number] DOB: 05/21/1945   Cancelled Treatment:    Reason Eval/Treat Not Completed: Pain limiting ability to participate. Chart reviewed, pt noted to have Hgb 6.7 and pending blood transfusion. On arrival offered pt bed level activity to prevent deconditioning, pt and family adamantly refusing. Pt cites 9/10 LLE pain despite premedicated for session. Family reports plan for AKA (noted in chart tentatively planned for this week) and family/pt deferring all therapy until after. Will re-attempt as able and appropriate.   Kathie Dike, M.S. OTR/L  03/27/23, 11:31 AM  ascom [phone]

## 2023-03-27 NOTE — Progress Notes (Signed)
Progress Note   [name] BMW:[id number] DOB: 03/17/1946 DOA: 03/21/2023     6 DOS: the patient was seen and examined on 03/27/2023   Brief hospital course:  Michele Matthews is a 77 y.o. female with medical history significant of hypertension, combined systolic and diastolic heart failure, COPD, hyperlipidemia, tobacco dependence who presents to the ED with chief complaint of left foot pain.  Patient states that she underwent a transmetatarsal amputation several years ago but it never quite healed properly.  2 weeks prior to presentation to [facility] noted increasing pain and a wound to the left foot.  Pain is gradually increased.  No fevers chills.  No nausea or vomiting.  Patient also endorsed a rash on the right side of her face consistent with shingles outbreak.  She denies pain or pruritus from this rash but states that she knows its there and is compelled to touch it.   On my evaluation patient is resting in bed.  She is in no distress but reports improvement after administration of pain medication.  Vital signs are stable.  Laboratory investigation significant for anemia with hemoglobin 6.4.  Patient did have mildly elevated lactic acid which improved after administration of intravenous fluids.     ED Course: Patient was given intravenous fluids, pain medication, IV antibiotics after culture data was taken.  Hospitalist contacted to admit under working diagnosis of sepsis secondary to infection cellulitis of left foot.       Assessment and Plan:   Left foot rest pain Known history of PAD Status post left TMA Patient presented to the ER for evaluation of worsening left foot pain S/P LLE angiogram which was done 02/24,  Diffuse atherosclerotic changes of the left lower extremity, occlusion of the SFA stents as well as the SFA and the popliteal in its entirety.  There appears to be anterior tibial runoff however this does not appear to cross the ankle. 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  Unable to assess left lower extremity due to pain Appreciate vascular surgery input, patient is status post left lower extremity angiography done 11/22  and is status post percutaneous transluminal angioplasty of the right common iliac artery as well as percutaneous transluminal angioplasty and stent placement right external iliac artery Per vascular surgery "the patient has no further revascularization options with progressive limb ischemia and severe knee contracture. Will likely require an AKA sometime next week".  Continue aspirin, Plavix and statin Pain control with Oxycodone       Left foot pain Concern for left foot cellulitis on admission Left heel ulcer Left foot x-ray shows a new lucency within the soft tissues distal and distal lateral to the fifth metatarsal stump and distal to the second metatarsal stump. No definitive new adjacent cortical erosion is seen. Re demonstration of ulceration of the distal posterior heel. No definitive cortical erosion is seen within the adjacent distal posterior calcaneus to indicate radiographic evidence of acute osteomyelitis. Appreciate podiatry input. 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 At this time she has an area of necrotic tissue distal left fifth metatarsal region with ulceration to the distal medial aspect as well as a pressure ulcer of the left heel with drainage. Will await vascular evaluation but I do not believe there is very much from a limb salvage standpoint. I doubt the necrotic ulceration on the distal lateral fifth metatarsal will heal and she  would still have this large heel decubitus ulceration that will likely not heal. If they can establish better perfusion long term wound care can be performed. I do not recommend debridement of the left foot. Given her pain status I suspect she would be  better off with a below the knee or above-the-knee amputation" Patient has completed a 5-day course of IV Rocephin.       Acute on chronic anemia History of iron deficiency anemia Hemoglobin on arrival 6.4.  Guaiac negative in ED.  No evidence of acute blood loss.  Vital signs stable.   Low MCV and high RDW indicative of iron deficiency anemia.  Patient carries this diagnosis but is not adherent to home iron therapies. Patient has been transfused 1 unit of packed RBC with improvement in her H&H Monitor closely during this hospitalization       Facial zoster Present on right side of face.   Patient denies any visual disturbance. Rash has crusted over and is in different stages of healing Continue Valtrex Continue airborne and contact precautions       Chronic systolic diastolic congestive heart failure LVEF 40 to 45% Appears euvolemic at this time. Continue carvedilol and furosemide Continue Cozaar     COPD, unspecified type/severity Active smoker Patient started smoking at age 47.  Not ready to quit.  ' Smoking cessation was discussed with patient in detail Continue nicotine transdermal patch       Essential hypertension Improved blood pressure control" on Cozaar and carvedilol     Generalized anxiety disorder Depression Stable Continue sertraline and trazodone       Stage IIIa chronic kidney disease Stable Monitor renal function closely           Subjective: Patient is seen and examined at the bedside.  Continues to have rest pain involving her left leg.  Physical Exam: Vitals:   03/26/23 1552 03/26/23 2032 03/26/23 2335 03/27/23 0744  BP: (!) 114/59 (!) 105/59 109/65 135/62  Pulse: 93 92 (!) 107 90  Resp: 16 20 20 18   Temp: 98.2 F (36.8 C) (!) 100.7 F (38.2 C) 98.8 F (37.1 C) 97.8 F (36.6 C)  TempSrc: Oral Oral    SpO2: 96% 99% 93% 96%  Weight:      Height:       General: NAD.  Appears frail, chronically ill-appearing HEENT:  Normocephalic, atraumatic Neck, supple, trachea midline, no tenderness Heart: Distant heart sounds.  No murmurs.  No pedal edema Lungs: Lungs are diminished.  Scattered crackles.  Normal work of breathing.  Room air Abdomen: Thin, soft NT/ND, normal bowel sounds Extremities: Left status post TMA.  Wound noted on distal aspect Skin: Wound on distal aspect of left foot stump Neurologic: Cranial nerves grossly intact, sensation intact, alert and oriented x3 Psychiatric: Normal affect   Data Reviewed: Labs reviewed.  Hemoglobin 6.7 There are no new results to review at this time.  Family Communication: Discussed plan of care with patient  Disposition: Status is: Inpatient Remains inpatient appropriate because: Requires an AKA  Planned Discharge Destination:  TBD    Time spent: 35 minutes  Author: Lucile Shutters, MD 03/27/2023 11:55 AM  For on call review [email or web address].

## 2023-03-27 NOTE — Progress Notes (Signed)
PT Cancellation Note  Patient Details Name: Michele Matthews MRN: [id number] DOB: 1945-05-03   Cancelled Treatment:    Reason Eval/Treat Not Completed: Pt's most recent Hgb 6.7 with patient scheduled for transfusion this date.  Also of note, per OT patient and family declined mobility this date secondary to pain.  Will attempt to see pt at a future date/time as medically appropriate.    Ovidio Hanger PT, DPT 03/27/23, 11:29 AM

## 2023-03-27 NOTE — H&P (View-Only) (Signed)
Progress Note    03/27/2023 3:29 PM 3 Days Post-Op  Subjective:  Michele Matthews is a 77 yo female who presents to [facility] Emergency room with chief complaint of left foot pain. Patient states that she underwent a transmetatarsal amputation several years ago but it never quite healed properly. 2 weeks prior to presentation to [facility] noted increasing pain and a wound to the left foot. Pain is gradually increased.   On 03/24/23 patient underwent abdominal aortogram with right and left  lower extremity angiogram with angioplasty of the right common iliac artery and angioplasty with stent placement to the right external iliac artery.  Patient is left lower extremity demonstrates a previous femoral/tibial bypass that is occluded with the SFA occluded throughout its length as it is the entire popliteal artery.  The tibioperoneal trunk is also occluded with the posterior tibial and anterior tibial arteries occluded.  There was no visible perfusion of the foot although the patient is moving her lower extremity.  On exam today the patient remains in quite some pain to her left lower extremity due to noted ischemia.  Plan is to take the patient to the operating room for a left lower extremity above-the-knee amputation.   Vitals:   03/26/23 2335 03/27/23 0744  BP: 109/65 135/62  Pulse: (!) 107 90  Resp: 20 18  Temp: 98.8 F (37.1 C) 97.8 F (36.6 C)  SpO2: 93% 96%   Physical Exam: Cardiac:  RRR, Normal S1, S2. No murmurs. Lungs:  normal non-labored breathing, without Rales, rhonchi, wheezing  Incisions:  None Extremities:  Unable to palpate Left Popliteal, Dorsalis Pedis, and post tibial pulses. Open and draining transmetatarsal incision and heel. Left lower extremity with ischemic changes, without Gangrene , with cellulitis; with open wounds.  Abdomen: Positive bowel sounds throughout, soft, nontender nondistended. Neurologic: Patient is very hard of hearing.  Alert and oriented to person place and  time.  Follows commands appropriately.  CBC    Component Value Date/Time   WBC 11.2 (H) 03/27/2023 0423   RBC 3.48 (L) 03/27/2023 0423   RBC 3.49 (L) 03/27/2023 0423   HGB 6.7 (L) 03/27/2023 0423   HGB 12.1 03/27/2014 1730   HCT 23.6 (L) 03/27/2023 0423   HCT 37.0 03/27/2014 1730   PLT 280 03/27/2023 0423   PLT 175 03/27/2014 1730   MCV 67.8 (L) 03/27/2023 0423   MCV 97 03/27/2014 1730   MCH 19.3 (L) 03/27/2023 0423   MCHC 28.4 (L) 03/27/2023 0423   RDW 25.1 (H) 03/27/2023 0423   RDW 13.3 03/27/2014 1730   LYMPHSABS 0.8 03/21/2023 1121   LYMPHSABS 1.9 11/25/2013 0401   MONOABS 0.7 03/21/2023 1121   MONOABS 0.7 11/25/2013 0401   EOSABS 0.1 03/21/2023 1121   EOSABS 0.2 11/25/2013 0401   BASOSABS 0.0 03/21/2023 1121   BASOSABS 0.0 11/25/2013 0401    BMET    Component Value Date/Time   NA 132 (L) 03/27/2023 0423   NA 140 03/28/2014 0441   K 4.0 03/27/2023 0423   K 4.0 03/28/2014 0441   CL 97 (L) 03/27/2023 0423   CL 110 (H) 03/28/2014 0441   CO2 27 03/27/2023 0423   CO2 24 03/28/2014 0441   GLUCOSE 93 03/27/2023 0423   GLUCOSE 86 03/28/2014 0441   BUN 20 03/27/2023 0423   BUN 19 (H) 03/28/2014 0441   CREATININE 0.73 03/27/2023 0423   CREATININE 0.79 11/24/2015 0908   CALCIUM 8.9 03/27/2023 0423   CALCIUM 8.7 03/28/2014 0441   GFRNONAA >60  03/27/2023 0423   GFRNONAA 76 11/24/2015 0908   GFRAA >60 07/28/2017 0507   GFRAA 87 11/24/2015 0908    INR    Component Value Date/Time   INR 1.0 05/26/2022 1354   INR 2.0 03/30/2014 0336     Intake/Output Summary (Last 24 hours) at 03/27/2023 1529 Last data filed at 03/26/2023 2030 Gross per 24 hour  Intake 240 ml  Output 200 ml  Net 40 ml     Assessment/Plan:  77 y.o. female is s/p abdominal aortogram with bilateral lower extremity angiogram.  3 Days Post-Op   Vascular surgery plans on taking the patient to the operating room on 03/29/2023 for a left above-the-knee amputation due to widespread extensive  ischemia.  I had a long detailed discussion with the patient, her husband and her daughter at the bedside today.  I discussed in detail the procedure, benefits, risks, and complications.  They all verbalized her understanding and wished to proceed as soon as possible.  I answered all their questions today.  Patient will remain n.p.o. after midnight on Wednesday for the operating room.  I discussed the plan in detail with Dr. Levora Dredge MD and he agrees with the plan.  DVT prophylaxis: ASA 81 mg daily, Plavix 75 mg daily.   Marcie Bal [facility] 03/27/2023 3:29 PM

## 2023-03-27 NOTE — Plan of Care (Signed)

## 2023-03-27 NOTE — Progress Notes (Signed)
Peripherally Inserted Central Catheter Placement  The IV Nurse has discussed with the patient and/or persons authorized to consent for the patient, the purpose of this procedure and the potential benefits and risks involved with this procedure.  The benefits include less needle sticks, lab draws from the catheter, and the patient may be discharged home with the catheter. Risks include, but not limited to, infection, bleeding, blood clot (thrombus formation), and puncture of an artery; nerve damage and irregular heartbeat and possibility to perform a PICC exchange if needed/ordered by physician.  Alternatives to this procedure were also discussed.  Bard Power PICC patient education guide, fact sheet on infection prevention and patient information card has been provided to patient /or left at bedside.    PICC Placement Documentation  PICC Single Lumen 03/27/23 Right Brachial 34 cm 0 cm (Active)  Indication for Insertion or Continuance of Line Limited venous access - need for IV therapy >5 days (PICC only) 03/27/23 2205  Exposed Catheter (cm) 0 cm 03/27/23 2205  Site Assessment Clean, Dry, Intact 03/27/23 2205  Line Status Blood return noted;Flushed;Saline locked 03/27/23 2205  Dressing Type Transparent;Securing device 03/27/23 2205  Dressing Status Antimicrobial disc in place;Clean, Dry, Intact 03/27/23 2205  Line Care Connections checked and tightened 03/27/23 2205  Line Adjustment (NICU/IV Team Only) No 03/27/23 2205  Dressing Intervention New dressing;Adhesive placed at insertion site (IV team only) 03/27/23 2205  Dressing Change Due 04/03/23 03/27/23 2205       Christeen Douglas 03/27/2023, 10:18 PM

## 2023-03-28 DIAGNOSIS — I998 Other disorder of circulatory system: Secondary | ICD-10-CM | POA: Diagnosis not present

## 2023-03-28 DIAGNOSIS — M79672 Pain in left foot: Secondary | ICD-10-CM | POA: Diagnosis not present

## 2023-03-28 LAB — CBC
HCT: 28.6 % — ABNORMAL LOW (ref 36.0–46.0)
Hemoglobin: 8.5 g/dL — ABNORMAL LOW (ref 12.0–15.0)
MCH: 21.3 pg — ABNORMAL LOW (ref 26.0–34.0)
MCHC: 29.7 g/dL — ABNORMAL LOW (ref 30.0–36.0)
MCV: 71.5 fL — ABNORMAL LOW (ref 80.0–100.0)
Platelets: 261 10*3/uL (ref 150–400)
RBC: 4 MIL/uL (ref 3.87–5.11)
RDW: 25.3 % — ABNORMAL HIGH (ref 11.5–15.5)
WBC: 10.4 10*3/uL (ref 4.0–10.5)
nRBC: 0 % (ref 0.0–0.2)

## 2023-03-28 LAB — BASIC METABOLIC PANEL
Anion gap: 9 (ref 5–15)
BUN: 18 mg/dL (ref 8–23)
CO2: 28 mmol/L (ref 22–32)
Calcium: 9.3 mg/dL (ref 8.9–10.3)
Chloride: 97 mmol/L — ABNORMAL LOW (ref 98–111)
Creatinine, Ser: 0.73 mg/dL (ref 0.44–1.00)
GFR, Estimated: >60 mL/min (ref >60)
Glucose, Bld: 104 mg/dL — ABNORMAL HIGH (ref 70–99)
Potassium: 4.1 mmol/L (ref 3.5–5.1)
Sodium: 134 mmol/L — ABNORMAL LOW (ref 135–145)

## 2023-03-28 LAB — GLUCOSE, CAPILLARY: Glucose-Capillary: 99 mg/dL (ref 70–99)

## 2023-03-28 NOTE — Progress Notes (Signed)
Progress Note    03/28/2023 12:22 PM 4 Days Post-Op  Subjective:   Michele Matthews is a 77 yo female who presents to [facility] Emergency room with chief complaint of left foot pain. Patient states that she underwent a transmetatarsal amputation several years ago but it never quite healed properly. 2 weeks prior to presentation to [facility] noted increasing pain and a wound to the left foot. Pain is gradually increased.    On 03/24/23 patient underwent abdominal aortogram with right and left  lower extremity angiogram with angioplasty of the right common iliac artery and angioplasty with stent placement to the right external iliac artery.  Patient is left lower extremity demonstrates a previous femoral/tibial bypass that is occluded with the SFA occluded throughout its length as it is the entire popliteal artery.  The tibioperoneal trunk is also occluded with the posterior tibial and anterior tibial arteries occluded.  There was no visible perfusion of the foot although the patient is moving her lower extremity.   On exam today the patient remains in quite some pain to her left lower extremity due to noted ischemia.  Plan is to take the patient to the operating room for a left lower extremity above-the-knee amputation.   Vitals:   03/28/23 0355 03/28/23 0748  BP: 127/66 (!) 144/64  Pulse: 99 (!) 104  Resp: 16 14  Temp: 98.1 F (36.7 C) 98.2 F (36.8 C)  SpO2: 93% 90%   Physical Exam: Cardiac:  RRR, Normal S1, S2. No murmurs. Lungs:  normal non-labored breathing, without Rales, rhonchi, wheezing  Incisions:  None Extremities:  Unable to palpate Left Popliteal, Dorsalis Pedis, and post tibial pulses. Open and draining transmetatarsal incision and heel. Left lower extremity with ischemic changes, without Gangrene , with cellulitis; with open wounds.  Abdomen: Positive bowel sounds throughout, soft, nontender nondistended. Neurologic: Patient is very hard of hearing.  Alert and oriented to person  place and time.  Follows commands appropriately.  CBC    Component Value Date/Time   WBC 10.4 03/28/2023 0544   RBC 4.00 03/28/2023 0544   HGB 8.5 (L) 03/28/2023 0544   HGB 12.1 03/27/2014 1730   HCT 28.6 (L) 03/28/2023 0544   HCT 37.0 03/27/2014 1730   PLT 261 03/28/2023 0544   PLT 175 03/27/2014 1730   MCV 71.5 (L) 03/28/2023 0544   MCV 97 03/27/2014 1730   MCH 21.3 (L) 03/28/2023 0544   MCHC 29.7 (L) 03/28/2023 0544   RDW 25.3 (H) 03/28/2023 0544   RDW 13.3 03/27/2014 1730   LYMPHSABS 0.8 03/21/2023 1121   LYMPHSABS 1.9 11/25/2013 0401   MONOABS 0.7 03/21/2023 1121   MONOABS 0.7 11/25/2013 0401   EOSABS 0.1 03/21/2023 1121   EOSABS 0.2 11/25/2013 0401   BASOSABS 0.0 03/21/2023 1121   BASOSABS 0.0 11/25/2013 0401    BMET    Component Value Date/Time   NA 134 (L) 03/28/2023 0544   NA 140 03/28/2014 0441   K 4.1 03/28/2023 0544   K 4.0 03/28/2014 0441   CL 97 (L) 03/28/2023 0544   CL 110 (H) 03/28/2014 0441   CO2 28 03/28/2023 0544   CO2 24 03/28/2014 0441   GLUCOSE 104 (H) 03/28/2023 0544   GLUCOSE 86 03/28/2014 0441   BUN 18 03/28/2023 0544   BUN 19 (H) 03/28/2014 0441   CREATININE 0.73 03/28/2023 0544   CREATININE 0.79 11/24/2015 0908   CALCIUM 9.3 03/28/2023 0544   CALCIUM 8.7 03/28/2014 0441   GFRNONAA >60 03/28/2023 0544  GFRNONAA 76 11/24/2015 0908   GFRAA >60 07/28/2017 0507   GFRAA 87 11/24/2015 0908    INR    Component Value Date/Time   INR 1.0 05/26/2022 1354   INR 2.0 03/30/2014 0336     Intake/Output Summary (Last 24 hours) at 03/28/2023 1222 Last data filed at 03/28/2023 3244 Gross per 24 hour  Intake 360 ml  Output 800 ml  Net -440 ml     Assessment/Plan:  77 y.o. female is s/p abdominal aortogram with bilateral lower extremity angiogram.  4 Days Post-Op   PLAN Vascular surgery plans to take the patient to the operating room tomorrow on 03/29/2023 for a left above-the-knee amputation due to widespread extensive ischemia. I had  a brief discussion again this morning with the patient about the procedure.  She again agrees to proceed forward tomorrow.  Patient will remain n.p.o. after midnight tonight for surgery tomorrow.  I discussed the plan again today in detail with Dr. Levora Dredge MD and he agrees with the plan.  DVT prophylaxis:  ASA 81 mg daily, Plavix 75 mg daily.    Marcie Bal [facility] 03/28/2023 12:22 PM

## 2023-03-28 NOTE — TOC Progression Note (Signed)
Transition of Care [facility]) - Progression Note    Patient Details  Name: Michele Matthews MRN: [id number] Date of Birth: 06-Mar-1946  [facility] CM/SW Contact  Margarito Liner, LCSW Phone Number: 03/28/2023, 2:39 PM  Clinical Narrative:  CSW notified [facility] of plan for AKA tomorrow.   Expected Discharge Plan: Home w Home Health Services Barriers to Discharge: Continued Medical Work up  Expected Discharge Plan and Services       Living arrangements for the past 2 months: Single Family Home                           HH Arranged: PT, OT, RN, Nurse's Aide HH Agency: [facility] Date [facility] Agency Contacted: 03/25/23   Representative spoke with at [facility] Agency: Laurelyn Sickle   Social Determinants of Health (SDOH) Interventions SDOH Screenings   Food Insecurity: No Food Insecurity (03/21/2023)  Housing: Low Risk  (03/21/2023)  Transportation Needs: No Transportation Needs (03/21/2023)  Utilities: Not At Risk (03/21/2023)  Financial Resource Strain: Medium Risk (11/08/2021)  Tobacco Use: High Risk (03/21/2023)    Readmission Risk Interventions    06/14/2022    8:47 AM  Readmission Risk Prevention Plan  Transportation Screening Complete  Medication Review (RN Care Manager) Complete  PCP or Specialist appointment within 3-5 days of discharge Complete  HRI or Home Care Consult Complete  SW Recovery Care/Counseling Consult Complete  Palliative Care Screening Not Applicable  Skilled Nursing Facility Not Applicable

## 2023-03-28 NOTE — Progress Notes (Signed)
Overnight issue with bloodbank. Pt was to receive 1 unit of blood on 11/25 due to a hgb of 6.7. Pt lost her IV access and dayshift was not able to administer blood per Rod RN at shift change. PICC line was inserted at 2205 on 11/25. Bayron Dalto RN made sure that blood was already released in Epic and tried to pick up the unit of blood from bloodbank immediately after PICC was placed and cleared for use. Bloodbank tech Luster Landsberg stated that patient had a unit of blood that was picked up and administered on 11/25. Srishti Strnad RN repeatedly told her that the pt had no IV access all day and therefore did not receive any blood on 11/25. Rasha repeatedly asked where the unit of blood that was picked up on dayshift was-Lela Gell RN voiced that she was not there during dayshift, therefore did not know what happened to the blood. Rasha repeated again that the blood was administered and Nygeria Lager RN informed her that there was no blood administered and no documentation of administration again. Luster Landsberg stated that she would need a new order for the unit of blood needed and a new hgb drawn before being able to release any blood for patient.  CN and provider Geradine Girt NP informed of issues. See orders placed and blood administration documentation. Genasis Zingale S Doc [place]

## 2023-03-28 NOTE — Care Management Important Message (Signed)
Important Message  Patient Details  Name: Michele Matthews MRN: [id number] Date of Birth: 10-11-1945   Important Message Given:  Other (see comment)  Patient is in an isolation room so I called her room [phone]) to review the Important Message from [employer] with her but there was no answer. Will try again.   Michele Matthews 03/28/2023, 2:43 PM

## 2023-03-28 NOTE — Plan of Care (Signed)
  Problem: Fluid Volume: Goal: Hemodynamic stability will improve Outcome: Progressing   Problem: Clinical Measurements: Goal: Diagnostic test results will improve Outcome: Progressing Goal: Signs and symptoms of infection will decrease Outcome: Progressing   Problem: Respiratory: Goal: Ability to maintain adequate ventilation will improve Outcome: Progressing   Problem: Education: Goal: Knowledge of General Education information will improve Description: Including pain rating scale, medication(s)/side effects and non-pharmacologic comfort measures Outcome: Progressing   Problem: Health Behavior/Discharge Planning: Goal: Ability to manage health-related needs will improve Outcome: Progressing   Problem: Clinical Measurements: Goal: Ability to maintain clinical measurements within normal limits will improve Outcome: Progressing Goal: Will remain free from infection Outcome: Progressing Goal: Diagnostic test results will improve Outcome: Progressing Goal: Respiratory complications will improve Outcome: Progressing Goal: Cardiovascular complication will be avoided Outcome: Progressing   Problem: Activity: Goal: Risk for activity intolerance will decrease Outcome: Progressing   Problem: Nutrition: Goal: Adequate nutrition will be maintained Outcome: Progressing   Problem: Coping: Goal: Level of anxiety will decrease Outcome: Progressing   Problem: Elimination: Goal: Will not experience complications related to bowel motility Outcome: Progressing Goal: Will not experience complications related to urinary retention Outcome: Progressing   Problem: Pain Management: Goal: General experience of comfort will improve Outcome: Progressing   Problem: Safety: Goal: Ability to remain free from injury will improve Outcome: Progressing   Problem: Skin Integrity: Goal: Risk for impaired skin integrity will decrease Outcome: Progressing   Problem: Education: Goal:  Understanding of CV disease, CV risk reduction, and recovery process will improve Outcome: Progressing Goal: Individualized Educational Video(s) Outcome: Progressing   Problem: Activity: Goal: Ability to return to baseline activity level will improve Outcome: Progressing   Problem: Cardiovascular: Goal: Ability to achieve and maintain adequate cardiovascular perfusion will improve Outcome: Progressing Goal: Vascular access site(s) Level 0-1 will be maintained Outcome: Progressing   Problem: Health Behavior/Discharge Planning: Goal: Ability to safely manage health-related needs after discharge will improve Outcome: Progressing

## 2023-03-28 NOTE — Plan of Care (Signed)
  Problem: Elimination: Goal: Will not experience complications related to urinary retention Outcome: Progressing   Problem: Fluid Volume: Goal: Hemodynamic stability will improve Outcome: Not Progressing   Problem: Clinical Measurements: Goal: Signs and symptoms of infection will decrease Outcome: Not Progressing   Problem: Education: Goal: Knowledge of General Education information will improve Description: Including pain rating scale, medication(s)/side effects and non-pharmacologic comfort measures Outcome: Not Progressing   Problem: Health Behavior/Discharge Planning: Goal: Ability to manage health-related needs will improve Outcome: Not Progressing   Problem: Activity: Goal: Risk for activity intolerance will decrease Outcome: Not Progressing   Problem: Coping: Goal: Level of anxiety will decrease Outcome: Not Progressing   Problem: Pain Management: Goal: General experience of comfort will improve Outcome: Not Progressing   Problem: Skin Integrity: Goal: Risk for impaired skin integrity will decrease Outcome: Not Progressing

## 2023-03-28 NOTE — Progress Notes (Signed)
Progress Note   [name] FYB:[id number] DOB: Jun 06, 1945 DOA: 03/21/2023     7 DOS: the patient was seen and examined on 03/28/2023   Brief hospital course:  Michele Matthews is a 77 y.o. female with medical history significant of hypertension, combined systolic and diastolic heart failure, COPD, hyperlipidemia, tobacco dependence who presents to the ED with chief complaint of left foot pain.  Patient states that she underwent a transmetatarsal amputation several years ago but it never quite healed properly.  2 weeks prior to presentation to [facility] noted increasing pain and a wound to the left foot.  Pain is gradually increased.  No fevers chills.  No nausea or vomiting.  Patient also endorsed a rash on the right side of her face consistent with shingles outbreak.  She denies pain or pruritus from this rash but states that she knows its there and is compelled to touch it.   On my evaluation patient is resting in bed.  She is in no distress but reports improvement after administration of pain medication.  Vital signs are stable.  Laboratory investigation significant for anemia with hemoglobin 6.4.  Patient did have mildly elevated lactic acid which improved after administration of intravenous fluids.     ED Course: Patient was given intravenous fluids, pain medication, IV antibiotics after culture data was taken.  Hospitalist contacted to admit under working diagnosis of sepsis secondary to infection cellulitis of left foot.      Assessment and Plan:  Left foot rest pain Known history of PAD Status post left TMA Patient presented to the ER for evaluation of worsening left foot pain S/P LLE angiogram which was done 02/24,  Diffuse atherosclerotic changes of the left lower extremity, occlusion of the SFA stents as well as the SFA and the popliteal in its entirety.  There appears to be anterior tibial runoff however this does not appear to cross the ankle. 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  Unable to assess left lower extremity due to pain Appreciate vascular surgery input, patient is status post left lower extremity angiography done 11/22  and is status post percutaneous transluminal angioplasty of the right common iliac artery as well as percutaneous transluminal angioplasty and stent placement right external iliac artery Per vascular surgery "the patient has no further revascularization options with progressive limb ischemia and severe knee contracture.  AKA is planned for a.m. Continue aspirin, Plavix and statin Pain control with Oxycodone       Left foot pain Concern for left foot cellulitis on admission Left heel ulcer Left foot x-ray shows a new lucency within the soft tissues distal and distal lateral to the fifth metatarsal stump and distal to the second metatarsal stump. No definitive new adjacent cortical erosion is seen. Re demonstration of ulceration of the distal posterior heel. No definitive cortical erosion is seen within the adjacent distal posterior calcaneus to indicate radiographic evidence of acute osteomyelitis. Appreciate podiatry input. 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 At this time she has an area of necrotic tissue distal left fifth metatarsal region with ulceration to the distal medial aspect as well as a pressure ulcer of the left heel with drainage. Will await vascular evaluation but I do not believe there is very much from a limb salvage standpoint. I doubt the necrotic ulceration on the distal lateral fifth metatarsal will heal and she would still have this large  heel decubitus ulceration that will likely not heal. If they can establish better perfusion long term wound care can be performed. I do not recommend debridement of the left foot. Given her pain status I suspect she would be better off with a below the  knee or above-the-knee amputation" Patient has completed a 5-day course of IV Rocephin. AKA is planned for am       Acute on chronic anemia History of iron deficiency anemia Hemoglobin on arrival 6.4.  Guaiac negative in ED.  No evidence of acute blood loss.  Vital signs stable.   Low MCV and high RDW indicative of iron deficiency anemia.  Patient carries this diagnosis but is not adherent to home iron therapies. Patient has been transfused 2 units of packed RBC with improvement in her H&H since her hospitalization Monitor closely during this hospitalization       Facial zoster Present on right side of face.   Patient denies any visual disturbance. Rash has crusted over and is in different stages of healing Continue Valtrex Continue airborne and contact precautions       Chronic systolic diastolic congestive heart failure LVEF 40 to 45% Appears euvolemic at this time. Continue carvedilol and furosemide Continue Cozaar     COPD, unspecified type/severity Active smoker Patient started smoking at age 14.  Not ready to quit.  ' Smoking cessation was discussed with patient in detail Continue nicotine transdermal patch       Essential hypertension Improved blood pressure control" on Cozaar and carvedilol     Generalized anxiety disorder Depression Stable Continue sertraline and trazodone       Stage IIIa chronic kidney disease Stable Monitor renal function closely                Subjective: Sleeping but arouses easily.  Physical Exam: Vitals:   03/28/23 0009 03/28/23 0033 03/28/23 0355 03/28/23 0748  BP: 118/63 107/84 127/66 (!) 144/64  Pulse: 99 95 99 (!) 104  Resp: 16 16 16 14   Temp: 97.9 F (36.6 C) 98 F (36.7 C) 98.1 F (36.7 C) 98.2 F (36.8 C)  TempSrc: Axillary Axillary Axillary Oral  SpO2: 93% 93% 93% 90%  Weight:      Height:       General: NAD.  Appears frail, chronically ill-appearing HEENT: Normocephalic, atraumatic Neck,  supple, trachea midline, no tenderness Heart: Distant heart sounds.  No murmurs.  No pedal edema Lungs: Lungs are diminished.  Scattered crackles.  Normal work of breathing.  Room air Abdomen: Thin, soft NT/ND, normal bowel sounds Extremities: Left status post TMA.  Wound noted on distal aspect Skin: Wound on distal aspect of left foot stump Neurologic: Cranial nerves grossly intact, sensation intact, alert and oriented x3 Psychiatric: Normal affect        Data Reviewed: Labs reviewed.  Hemoglobin 8.5, MCV 71.5 There are no new results to review at this time.  Family Communication: None  Disposition: Status is: Inpatient Remains inpatient appropriate because: For AKA in a.m.  Planned Discharge Destination: Home with Home Health    Time spent: 33 minutes  Author: Lucile Shutters, MD 03/28/2023 11:39 AM  For on call review [email or web address].

## 2023-03-28 NOTE — Progress Notes (Signed)
PT Cancellation Note  Patient Details Name: Michele Matthews MRN: [id number] DOB: [birth year]   Cancelled Treatment:    Reason Eval/Treat Not Completed: Other (comment): Per chart review pt scheduled for a left above-the-knee amputation tomorrow, 03/29/2023, due to widespread extensive ischemia. Will hold PT services until pt is post surgery and will attempt to see pt at a future date/time as medically appropriate upon receipt of new or continue at transfer PT orders.     Ovidio Hanger PT, DPT 03/28/23, 3:37 PM

## 2023-03-29 ENCOUNTER — Other Ambulatory Visit: Payer: Self-pay

## 2023-03-29 ENCOUNTER — Inpatient Hospital Stay: Payer: Medicare HMO | Admitting: Certified Registered"

## 2023-03-29 ENCOUNTER — Encounter: Payer: Self-pay | Admitting: Internal Medicine

## 2023-03-29 ENCOUNTER — Encounter
Admission: EM | Disposition: A | Discharge status: Home Health | Payer: Self-pay | Source: Home / Self Care | Attending: Internal Medicine

## 2023-03-29 DIAGNOSIS — I96 Gangrene, not elsewhere classified: Secondary | ICD-10-CM | POA: Diagnosis not present

## 2023-03-29 DIAGNOSIS — I739 Peripheral vascular disease, unspecified: Secondary | ICD-10-CM

## 2023-03-29 HISTORY — PX: AMPUTATION: SHX166

## 2023-03-29 LAB — BASIC METABOLIC PANEL
Anion gap: 10 (ref 5–15)
Anion gap: 9 (ref 5–15)
BUN: 17 mg/dL (ref 8–23)
BUN: 15 mg/dL (ref 8–23)
CO2: 29 mmol/L (ref 22–32)
CO2: 25 mmol/L (ref 22–32)
Calcium: 9.7 mg/dL (ref 8.9–10.3)
Calcium: 8.8 mg/dL — ABNORMAL LOW (ref 8.9–10.3)
Chloride: 93 mmol/L — ABNORMAL LOW (ref 98–111)
Chloride: 96 mmol/L — ABNORMAL LOW (ref 98–111)
Creatinine, Ser: 0.86 mg/dL (ref 0.44–1.00)
Creatinine, Ser: 0.71 mg/dL (ref 0.44–1.00)
GFR, Estimated: >60 mL/min (ref >60)
GFR, Estimated: >60 mL/min (ref >60)
Glucose, Bld: 112 mg/dL — ABNORMAL HIGH (ref 70–99)
Glucose, Bld: 147 mg/dL — ABNORMAL HIGH (ref 70–99)
Potassium: 4.7 mmol/L (ref 3.5–5.1)
Potassium: 4.1 mmol/L (ref 3.5–5.1)
Sodium: 132 mmol/L — ABNORMAL LOW (ref 135–145)
Sodium: 130 mmol/L — ABNORMAL LOW (ref 135–145)

## 2023-03-29 LAB — CBC
HCT: 30.7 % — ABNORMAL LOW (ref 36.0–46.0)
Hemoglobin: 9.1 g/dL — ABNORMAL LOW (ref 12.0–15.0)
MCH: 21.3 pg — ABNORMAL LOW (ref 26.0–34.0)
MCHC: 29.6 g/dL — ABNORMAL LOW (ref 30.0–36.0)
MCV: 71.9 fL — ABNORMAL LOW (ref 80.0–100.0)
Platelets: 273 10*3/uL (ref 150–400)
RBC: 4.27 MIL/uL (ref 3.87–5.11)
RDW: 26.2 % — ABNORMAL HIGH (ref 11.5–15.5)
WBC: 14 10*3/uL — ABNORMAL HIGH (ref 4.0–10.5)
nRBC: 0 % (ref 0.0–0.2)

## 2023-03-29 LAB — HEMOGLOBIN AND HEMATOCRIT, BLOOD
HCT: 28.2 % — ABNORMAL LOW (ref 36.0–46.0)
Hemoglobin: 8.4 g/dL — ABNORMAL LOW (ref 12.0–15.0)

## 2023-03-29 LAB — TROPONIN I (HIGH SENSITIVITY)
Troponin I (High Sensitivity): 34 ng/L — ABNORMAL HIGH (ref <18)
Troponin I (High Sensitivity): 36 ng/L — ABNORMAL HIGH (ref <18)

## 2023-03-29 SURGERY — AMPUTATION, ABOVE KNEE
Anesthesia: General | Site: Knee | Laterality: Left

## 2023-03-29 MED ORDER — CHLORHEXIDINE GLUCONATE 4 % EX SOLN: 60.0000 mL | Freq: Once | CUTANEOUS | Status: DC

## 2023-03-29 MED ORDER — EPINEPHRINE 1 MG/10ML IJ SOSY
PREFILLED_SYRINGE | INTRAMUSCULAR | Status: DC | PRN
  Administered 2023-03-29 (×3): 10 ug via INTRAVENOUS

## 2023-03-29 MED ORDER — SUGAMMADEX SODIUM 200 MG/2ML IV SOLN
INTRAVENOUS | Status: DC | PRN
  Administered 2023-03-29: 200 mg via INTRAVENOUS

## 2023-03-29 MED ORDER — DIPHENHYDRAMINE HCL 50 MG/ML IJ SOLN: 50.0000 mg | Freq: Once | INTRAMUSCULAR | Status: DC | PRN

## 2023-03-29 MED ORDER — PHENYLEPHRINE HCL-NACL 20-0.9 MG/250ML-% IV SOLN
INTRAVENOUS | Status: AC
Start: 2023-03-29 — End: ?
  Filled 2023-03-29: qty 250

## 2023-03-29 MED ORDER — ROCURONIUM BROMIDE 100 MG/10ML IV SOLN
INTRAVENOUS | Status: DC | PRN
  Administered 2023-03-29: 50 mg via INTRAVENOUS

## 2023-03-29 MED ORDER — ACETAMINOPHEN 10 MG/ML IV SOLN
INTRAVENOUS | Status: DC | PRN
  Administered 2023-03-29: 1000 mg via INTRAVENOUS

## 2023-03-29 MED ORDER — ONDANSETRON HCL 4 MG/2ML IJ SOLN
INTRAMUSCULAR | Status: DC | PRN
  Administered 2023-03-29: 4 mg via INTRAVENOUS

## 2023-03-29 MED ORDER — FENTANYL CITRATE (PF) 100 MCG/2ML IJ SOLN
INTRAMUSCULAR | Status: AC
  Filled 2023-03-29: qty 2

## 2023-03-29 MED ORDER — BUPIVACAINE LIPOSOME 1.3 % IJ SUSP
INTRAMUSCULAR | Status: AC
  Filled 2023-03-29: qty 20

## 2023-03-29 MED ORDER — SODIUM CHLORIDE 0.9 % IV SOLN
INTRAVENOUS | Status: DC
Start: 2023-03-29 — End: 2023-03-29

## 2023-03-29 MED ORDER — SODIUM CHLORIDE (PF) 0.9 % IJ SOLN
INTRAMUSCULAR | Status: AC
  Filled 2023-03-29: qty 50

## 2023-03-29 MED ORDER — MIDAZOLAM HCL 2 MG/ML PO SYRP: 8.0000 mg | ORAL_SOLUTION | Freq: Once | ORAL | Status: DC | PRN

## 2023-03-29 MED ORDER — KETAMINE HCL 50 MG/5ML IJ SOSY
PREFILLED_SYRINGE | INTRAMUSCULAR | Status: AC
Start: 2023-03-29 — End: ?
  Filled 2023-03-29: qty 5

## 2023-03-29 MED ORDER — GLYCOPYRROLATE 0.2 MG/ML IJ SOLN
INTRAMUSCULAR | Status: DC | PRN
  Administered 2023-03-29: .2 mg via INTRAVENOUS

## 2023-03-29 MED ORDER — LACTATED RINGERS IV SOLN
INTRAVENOUS | Status: DC | PRN
Start: 2023-03-29 — End: 2023-03-29

## 2023-03-29 MED ORDER — ALBUMIN HUMAN 5 % IV SOLN
INTRAVENOUS | Status: AC
Start: 2023-03-29 — End: ?
  Filled 2023-03-29: qty 250

## 2023-03-29 MED ORDER — OXYCODONE-ACETAMINOPHEN 5-325 MG PO TABS
1.0000 | ORAL_TABLET | ORAL | Status: DC | PRN
  Administered 2023-03-29 (×2): 2 via ORAL
  Administered 2023-03-30: 1 via ORAL
  Administered 2023-03-30 (×2): 2 via ORAL
  Administered 2023-03-31 – 2023-04-03 (×6): 1 via ORAL
  Administered 2023-04-03: 2 via ORAL
  Filled 2023-03-29: qty 1
  Filled 2023-03-29: qty 2
  Filled 2023-03-29: qty 1
  Filled 2023-03-29: qty 2
  Filled 2023-03-29: qty 1
  Filled 2023-03-29: qty 2
  Filled 2023-03-29: qty 1
  Filled 2023-03-29 (×2): qty 2
  Filled 2023-03-29: qty 1
  Filled 2023-03-29: qty 2
  Filled 2023-03-29 (×2): qty 1
  Filled 2023-03-29: qty 2

## 2023-03-29 MED ORDER — PROPOFOL 10 MG/ML IV BOLUS
INTRAVENOUS | Status: DC | PRN
  Administered 2023-03-29: 70 mg via INTRAVENOUS

## 2023-03-29 MED ORDER — MORPHINE SULFATE (PF) 2 MG/ML IV SOLN: 2.0000 mg | INTRAVENOUS | Status: DC | PRN

## 2023-03-29 MED ORDER — ALBUMIN HUMAN 5 % IV SOLN
INTRAVENOUS | Status: DC | PRN
Start: 2023-03-29 — End: 2023-03-29

## 2023-03-29 MED ORDER — FENTANYL CITRATE (PF) 100 MCG/2ML IJ SOLN
INTRAMUSCULAR | Status: DC | PRN
  Administered 2023-03-29: 50 ug via INTRAVENOUS

## 2023-03-29 MED ORDER — ACETAMINOPHEN 10 MG/ML IV SOLN
INTRAVENOUS | Status: AC
  Filled 2023-03-29: qty 100

## 2023-03-29 MED ORDER — PHENYLEPHRINE 80 MCG/ML (10ML) SYRINGE FOR IV PUSH (FOR BLOOD PRESSURE SUPPORT)
PREFILLED_SYRINGE | INTRAVENOUS | Status: DC | PRN
  Administered 2023-03-29: 240 ug via INTRAVENOUS
  Administered 2023-03-29: 160 ug via INTRAVENOUS
  Administered 2023-03-29 (×4): 240 ug via INTRAVENOUS
  Administered 2023-03-29: 80 ug via INTRAVENOUS
  Administered 2023-03-29 (×2): 240 ug via INTRAVENOUS
  Administered 2023-03-29: 80 ug via INTRAVENOUS

## 2023-03-29 MED ORDER — METHYLPREDNISOLONE SODIUM SUCC 125 MG IJ SOLR: 125.0000 mg | Freq: Once | INTRAMUSCULAR | Status: DC | PRN

## 2023-03-29 MED ORDER — DROPERIDOL 2.5 MG/ML IJ SOLN: 0.6250 mg | Freq: Once | INTRAMUSCULAR | Status: DC | PRN

## 2023-03-29 MED ORDER — CEFAZOLIN SODIUM-DEXTROSE 2-4 GM/100ML-% IV SOLN
2.0000 g | INTRAVENOUS | Status: AC
  Administered 2023-03-29: 2 g via INTRAVENOUS
  Filled 2023-03-29: qty 100

## 2023-03-29 MED ORDER — NOREPINEPHRINE 4 MG/250ML-% IV SOLN
INTRAVENOUS | Status: DC | PRN
Start: 2023-03-29 — End: 2023-03-29
  Administered 2023-03-29: 8 ug/min via INTRAVENOUS

## 2023-03-29 MED ORDER — NOREPINEPHRINE 4 MG/250ML-% IV SOLN
0.0000 ug/min | INTRAVENOUS | Status: DC
  Filled 2023-03-29: qty 250

## 2023-03-29 MED ORDER — EPHEDRINE SULFATE-NACL 50-0.9 MG/10ML-% IV SOSY
PREFILLED_SYRINGE | INTRAVENOUS | Status: DC | PRN
  Administered 2023-03-29 (×4): 10 mg via INTRAVENOUS

## 2023-03-29 MED ORDER — BUPIVACAINE HCL (PF) 0.5 % IJ SOLN
INTRAMUSCULAR | Status: AC
  Filled 2023-03-29: qty 30

## 2023-03-29 MED ORDER — BUPIVACAINE LIPOSOME 1.3 % IJ SUSP
INTRAMUSCULAR | Status: DC | PRN
  Administered 2023-03-29: 50 mL

## 2023-03-29 MED ORDER — LIDOCAINE HCL (CARDIAC) PF 100 MG/5ML IV SOSY
PREFILLED_SYRINGE | INTRAVENOUS | Status: DC | PRN
  Administered 2023-03-29: 60 mg via INTRAVENOUS

## 2023-03-29 MED ORDER — FAMOTIDINE 20 MG PO TABS: 40.0000 mg | ORAL_TABLET | Freq: Once | ORAL | Status: DC | PRN

## 2023-03-29 MED ORDER — NOREPINEPHRINE 4 MG/250ML-% IV SOLN
INTRAVENOUS | Status: AC
  Filled 2023-03-29: qty 250

## 2023-03-29 MED ORDER — DEXAMETHASONE SODIUM PHOSPHATE 10 MG/ML IJ SOLN
INTRAMUSCULAR | Status: DC | PRN
  Administered 2023-03-29: 10 mg via INTRAVENOUS

## 2023-03-29 MED ORDER — FENTANYL CITRATE (PF) 100 MCG/2ML IJ SOLN: 25.0000 ug | INTRAMUSCULAR | Status: DC | PRN

## 2023-03-29 MED ORDER — 0.9 % SODIUM CHLORIDE (POUR BTL) OPTIME
TOPICAL | Status: DC | PRN
  Administered 2023-03-29: 1000 mL

## 2023-03-29 MED ORDER — PROPOFOL 10 MG/ML IV BOLUS
INTRAVENOUS | Status: AC
  Filled 2023-03-29: qty 20

## 2023-03-29 MED ORDER — NOREPINEPHRINE 4 MG/250ML-% IV SOLN
0.0000 ug/min | INTRAVENOUS | Status: DC
  Administered 2023-03-29: 2 ug/min via INTRAVENOUS
  Filled 2023-03-29: qty 250

## 2023-03-29 MED ORDER — PHENYLEPHRINE HCL-NACL 20-0.9 MG/250ML-% IV SOLN
INTRAVENOUS | Status: DC | PRN
  Administered 2023-03-29: 25 ug/min via INTRAVENOUS
  Administered 2023-03-29: 30 ug/min via INTRAVENOUS

## 2023-03-29 MED ORDER — VASOPRESSIN 20 UNIT/ML IV SOLN
INTRAVENOUS | Status: DC | PRN
  Administered 2023-03-29: 6 [IU] via INTRAVENOUS
  Administered 2023-03-29 (×3): 2 [IU] via INTRAVENOUS
  Administered 2023-03-29: 4 [IU] via INTRAVENOUS
  Administered 2023-03-29: 2 [IU] via INTRAVENOUS

## 2023-03-29 SURGICAL SUPPLY — 37 items
BLADE SAW SAG 25.4X90 (BLADE) ×1 IMPLANT
BLADE SURG SZ10 CARB STEEL (BLADE) ×1 IMPLANT
BNDG COHESIVE 4X5 TAN STRL LF (GAUZE/BANDAGES/DRESSINGS) ×1 IMPLANT
BNDG ELASTIC 4X5.8 VLCR NS LF (GAUZE/BANDAGES/DRESSINGS) IMPLANT
BNDG GAUZE DERMACEA FLUFF 4 (GAUZE/BANDAGES/DRESSINGS) ×1 IMPLANT
CHLORAPREP W/TINT 26 (MISCELLANEOUS) ×1 IMPLANT
DRAPE INCISE IOBAN 66X45 STRL (DRAPES) ×1 IMPLANT
DRSG GAUZE FLUFF 36X18 (GAUZE/BANDAGES/DRESSINGS) ×1 IMPLANT
DRSG XEROFORM 1X8 (GAUZE/BANDAGES/DRESSINGS) IMPLANT
ELECT CAUTERY BLADE 6.4 (BLADE) ×1 IMPLANT
ELECT REM PT RETURN 9FT ADLT (ELECTROSURGICAL) ×1
ELECTRODE REM PT RTRN 9FT ADLT (ELECTROSURGICAL) ×1 IMPLANT
GLOVE BIO SURGEON STRL SZ7 (GLOVE) ×1 IMPLANT
GLOVE SURG SYN 8.0 (GLOVE) ×1 IMPLANT
GLOVE SURG SYN 8.0 PF PI (GLOVE) ×1 IMPLANT
GOWN STRL REUS W/ TWL LRG LVL3 (GOWN DISPOSABLE) ×2 IMPLANT
GOWN STRL REUS W/ TWL XL LVL3 (GOWN DISPOSABLE) ×1 IMPLANT
HANDLE YANKAUER SUCT BULB TIP (MISCELLANEOUS) ×1 IMPLANT
KIT TURNOVER KIT A (KITS) ×1 IMPLANT
MANIFOLD NEPTUNE II (INSTRUMENTS) ×1 IMPLANT
MAT ABSORB FLUID 56X50 GRAY (MISCELLANEOUS) ×1 IMPLANT
NDL HYPO 21X1.5 SAFETY (NEEDLE) ×1 IMPLANT
NEEDLE HYPO 21X1.5 SAFETY (NEEDLE) ×1 IMPLANT
NS IRRIG 1000ML POUR BTL (IV SOLUTION) IMPLANT
PACK EXTREMITY ARMC (MISCELLANEOUS) ×1 IMPLANT
PAD ABD DERMACEA PRESS 5X9 (GAUZE/BANDAGES/DRESSINGS) IMPLANT
PAD PREP OB/GYN DISP 24X41 (PERSONAL CARE ITEMS) ×1 IMPLANT
SPONGE T-LAP 18X18 ~~LOC~~+RFID (SPONGE) ×2 IMPLANT
STAPLER SKIN PROX 35W (STAPLE) IMPLANT
STOCKINETTE M/LG 89821 (MISCELLANEOUS) ×1 IMPLANT
SUT SILK 2-0 18XBRD TIE 12 (SUTURE) IMPLANT
SUT SILK 3-0 18XBRD TIE 12 (SUTURE) ×1 IMPLANT
SUT VIC AB 0 CT1 36 (SUTURE) ×6 IMPLANT
SUT VIC AB 3-0 SH 27X BRD (SUTURE) ×2 IMPLANT
SYR 20ML LL LF (SYRINGE) ×2 IMPLANT
TAPE UMBILICAL 1/8 X36 TWILL (MISCELLANEOUS) ×1 IMPLANT
TRAP FLUID SMOKE EVACUATOR (MISCELLANEOUS) ×1 IMPLANT

## 2023-03-29 NOTE — Progress Notes (Signed)
Patient returns from PACU, alert but drowsy.VSS. Dressing to left stump CDI. MD stops by room to speak to husband but husband is not in room. Patient belongings returned to room.

## 2023-03-29 NOTE — Progress Notes (Signed)
Nurse was told in report patient was ready for surgery and that she had been called for. Transport arrived and called nurse to room and said patient needed IVF and bracelet was off and rings and earrings were still on. Rings and earrings removed and placed in bag in room, no family present. This nurse gave one bag of NS and IV tubing as requested and new arm bracelet was printed and applied. Patient was taken to OR

## 2023-03-29 NOTE — Interval H&P Note (Signed)
History and Physical Interval Note:  03/29/2023 7:34 AM  [name]  has presented today for surgery, with the diagnosis of PAD.  The various methods of treatment have been discussed with the patient and family. After consideration of risks, benefits and other options for treatment, the patient has consented to  Procedure(s): Lower Extremity Angiography (Left) as a surgical intervention.  The patient's history has been reviewed, patient examined, no change in status, stable for surgery.  I have reviewed the patient's chart and labs.  Questions were answered to the patient's satisfaction.     Michele Matthews

## 2023-03-29 NOTE — Transfer of Care (Signed)
Immediate Anesthesia Transfer of Care Note  Patient: CHRYSTINA PARGA  Procedure(s) Performed: AMPUTATION ABOVE KNEE (Left: Knee)  Patient Location: PACU  Anesthesia Type:General  Level of Consciousness: drowsy and patient cooperative  Airway & Oxygen Therapy: Patient Spontanous Breathing and Patient connected to face mask oxygen  Post-op Assessment: Report given to RN and Post -op Vital signs reviewed and stable  Post vital signs: Reviewed and stable  Last Vitals:  Vitals Value Taken Time  BP 179/103 03/29/23 1045  Temp    Pulse 89 03/29/23 1057  Resp 15 03/29/23 1057  SpO2 100 % 03/29/23 1057  Vitals shown include unfiled device data.  Last Pain:  Vitals:   03/28/23 2346  TempSrc: Oral  PainSc:       Patients Stated Pain Goal: 2 (03/28/23 1900)  Complications: No notable events documented.

## 2023-03-29 NOTE — Plan of Care (Signed)
  Problem: Fluid Volume: Goal: Hemodynamic stability will improve Outcome: Progressing   Problem: Respiratory: Goal: Ability to maintain adequate ventilation will improve Outcome: Progressing   Problem: Health Behavior/Discharge Planning: Goal: Ability to manage health-related needs will improve Outcome: Progressing   Problem: Nutrition: Goal: Adequate nutrition will be maintained Outcome: Progressing   Problem: Elimination: Goal: Will not experience complications related to bowel motility Outcome: Progressing   Problem: Safety: Goal: Ability to remain free from injury will improve Outcome: Progressing

## 2023-03-29 NOTE — Progress Notes (Signed)
Patient has not returned from OR. Charge nurse stated room is listed as dirty. Nurse went to room and took patient's phone, glasses and rings that were removed before surgery. Nurse will keep these belongings at nurses station until patient is assigned another room or returns from PACU.

## 2023-03-29 NOTE — Anesthesia Procedure Notes (Signed)
Procedure Name: Intubation Date/Time: 03/29/2023 8:05 AM  Performed by: Mohammed Kindle, CRNAPre-anesthesia Checklist: Patient identified, Emergency Drugs available, Suction available and Patient being monitored Patient Re-evaluated:Patient Re-evaluated prior to induction Oxygen Delivery Method: Circle system utilized Preoxygenation: Pre-oxygenation with 100% oxygen Induction Type: IV induction Ventilation: Mask ventilation without difficulty Tube type: Oral Number of attempts: 1 Airway Equipment and Method: Stylet and Oral airway Placement Confirmation: ETT inserted through vocal cords under direct vision, positive ETCO2, breath sounds checked- equal and bilateral and CO2 detector Secured at: 21 cm Tube secured with: Tape Dental Injury: Teeth and Oropharynx as per pre-operative assessment

## 2023-03-29 NOTE — Progress Notes (Signed)
Rings,earrings, cell phone and glasses given to husband. Patient's husband asks nurse to give pain medication. Patient yelling out per husband. Nurse medicated per orders.

## 2023-03-29 NOTE — Plan of Care (Signed)
  Problem: Fluid Volume: Goal: Hemodynamic stability will improve Outcome: Progressing   Problem: Clinical Measurements: Goal: Diagnostic test results will improve Outcome: Progressing Goal: Signs and symptoms of infection will decrease Outcome: Progressing   Problem: Respiratory: Goal: Ability to maintain adequate ventilation will improve Outcome: Progressing   Problem: Education: Goal: Knowledge of General Education information will improve Description: Including pain rating scale, medication(s)/side effects and non-pharmacologic comfort measures Outcome: Progressing   Problem: Health Behavior/Discharge Planning: Goal: Ability to manage health-related needs will improve Outcome: Progressing   Problem: Clinical Measurements: Goal: Ability to maintain clinical measurements within normal limits will improve Outcome: Progressing Goal: Will remain free from infection Outcome: Progressing Goal: Diagnostic test results will improve Outcome: Progressing Goal: Respiratory complications will improve Outcome: Progressing Goal: Cardiovascular complication will be avoided Outcome: Progressing   Problem: Activity: Goal: Risk for activity intolerance will decrease Outcome: Progressing   Problem: Nutrition: Goal: Adequate nutrition will be maintained Outcome: Progressing   Problem: Coping: Goal: Level of anxiety will decrease Outcome: Progressing   Problem: Elimination: Goal: Will not experience complications related to bowel motility Outcome: Progressing Goal: Will not experience complications related to urinary retention Outcome: Progressing   Problem: Pain Management: Goal: General experience of comfort will improve Outcome: Progressing   Problem: Safety: Goal: Ability to remain free from injury will improve Outcome: Progressing   Problem: Skin Integrity: Goal: Risk for impaired skin integrity will decrease Outcome: Progressing   Problem: Education: Goal:  Understanding of CV disease, CV risk reduction, and recovery process will improve Outcome: Progressing Goal: Individualized Educational Video(s) Outcome: Progressing   Problem: Activity: Goal: Ability to return to baseline activity level will improve Outcome: Progressing   Problem: Cardiovascular: Goal: Ability to achieve and maintain adequate cardiovascular perfusion will improve Outcome: Progressing Goal: Vascular access site(s) Level 0-1 will be maintained Outcome: Progressing   Problem: Health Behavior/Discharge Planning: Goal: Ability to safely manage health-related needs after discharge will improve Outcome: Progressing

## 2023-03-29 NOTE — Progress Notes (Addendum)
Progress Note   [name] ZOX:[id number] DOB: 1945-08-15 DOA: 03/21/2023     8 DOS: the patient was seen and examined on 03/29/2023   Brief hospital course:  Michele Matthews is a 77 y.o. female with medical history significant of hypertension, combined systolic and diastolic heart failure, COPD, hyperlipidemia, tobacco dependence who presents to the ED with chief complaint of left foot pain.  Patient states that she underwent a transmetatarsal amputation several years ago but it never quite healed properly.  2 weeks prior to presentation to [facility] noted increasing pain and a wound to the left foot.  Pain is gradually increased.  No fevers chills.  No nausea or vomiting.  Patient also endorsed a rash on the right side of her face consistent with shingles outbreak.  She denies pain or pruritus from this rash but states that she knows its there and is compelled to touch it.   On my evaluation patient is resting in bed.  She is in no distress but reports improvement after administration of pain medication.  Vital signs are stable.  Laboratory investigation significant for anemia with hemoglobin 6.4.  Patient did have mildly elevated lactic acid which improved after administration of intravenous fluids.     ED Course: Patient was given intravenous fluids, pain medication, IV antibiotics after culture data was taken.  Hospitalist contacted to admit under working diagnosis of sepsis secondary to infection cellulitis of left foot.      Assessment and Plan:  Left foot rest pain Known history of PAD Status post left TMA Patient presented to the ER for evaluation of worsening left foot pain S/P LLE angiogram which was done 02/24,  Diffuse atherosclerotic changes of the left lower extremity, occlusion of the SFA stents as well as the SFA and the popliteal in its entirety.  There appears to be anterior tibial runoff however this does not appear to cross the ankle. 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  Unable to assess left lower extremity due to pain Appreciate vascular surgery input, patient is status post left lower extremity angiography done 11/22  and is status post percutaneous transluminal angioplasty of the right common iliac artery as well as percutaneous transluminal angioplasty and stent placement right external iliac artery Per vascular surgery "the patient has no further revascularization options with progressive limb ischemia and severe knee contracture.  AKA is planned for a.m. Continue aspirin, Plavix and statin Pain control with Oxycodone     Left foot pain Concern for left foot cellulitis on admission Left heel ulcer Left foot x-ray shows a new lucency within the soft tissues distal and distal lateral to the fifth metatarsal stump and distal to the second metatarsal stump. No definitive new adjacent cortical erosion is seen. Re demonstration of ulceration of the distal posterior heel. No definitive cortical erosion is seen within the adjacent distal posterior calcaneus to indicate radiographic evidence of acute osteomyelitis. Appreciate podiatry input. 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 At this time she has an area of necrotic tissue distal left fifth metatarsal region with ulceration to the distal medial aspect as well as a pressure ulcer of the left heel with drainage. Will await vascular evaluation but I do not believe there is very much from a limb salvage standpoint. I doubt the necrotic ulceration on the distal lateral fifth metatarsal will heal and she would still have this large heel decubitus  ulceration that will likely not heal. If they can establish better perfusion long term wound care can be performed. I do not recommend debridement of the left foot. Given her pain status I suspect she would be better off with a below the  knee or above-the-knee amputation" Patient has completed a 5-day course of IV Rocephin.  11/17 - pt underwent LEFT AKA with Dr. Gilda Crease    Transient Hypotension -- 11/27 during surgery and post-op. Briefly required Levophed, but since weaned off and MAPs are stable --Cancel transfer to stepdown unit --Maintain MAP > 65 --Hold antihypertensives   Hyponatremia - mild, likely hypovolemic as she was NPO for surgery. Na 130 today, down from 134 >> 132 previously. --Monitor BMP for now --Further evaluation if not improving     Acute on chronic anemia History of iron deficiency anemia Hemoglobin on arrival 6.4.  Guaiac negative in ED.  No evidence of acute blood loss.  Vital signs stable.   Low MCV and high RDW indicative of iron deficiency anemia.  Patient carries this diagnosis but is not adherent to home iron therapies. Patient has been transfused 2 units of packed RBC with improvement in her H&H since her hospitalization Monitor closely during this hospitalization   Facial zoster Present on right side of face.   Patient denies any visual disturbance. Rash has crusted over and is in different stages of healing --Continue Valtrex --Continue airborne and contact precautions   Chronic systolic diastolic congestive heart failure LVEF 40 to 45% Appears euvolemic at this time. --Continue carvedilol and furosemide --Continue Cozaar   COPD, unspecified type/severity Active smoker Patient started smoking at age 38.  Not ready to quit.  ' Smoking cessation was discussed with patient in detail --Continue nicotine transdermal patch   Essential hypertension Improved blood pressure control" on Cozaar and carvedilol   Generalized anxiety disorder Depression Stable Continue sertraline and trazodone   Stage IIIa chronic kidney disease Stable Monitor renal function closely                Subjective: pt seen in PACU today, having low BP's post-operatively, briefly required  Levophed but was able to be weaned off.  Pt was sleeping, woke up to voice, asked what happened, what's going on.  She denies complaints including pain or dizziness.  Physical Exam: Vitals:   03/29/23 1505 03/29/23 1511 03/29/23 1515 03/29/23 1532  BP: (!) 100/54 100/75 (!) 105/53 (!) 105/57  Pulse: 89 88 86 91  Resp: 19 18 14 16   Temp:   (!) 97 F (36.1 C)   TempSrc:      SpO2: 98% 98% 98% 98%  Weight:      Height:       General exam: sleeping, woke up to voice, no acute distress HEENT: poor dentition, moist mucus membranes, hearing grossly normal  Respiratory system: CTAB anteriorly, no wheezes, rales or rhonchi, normal respiratory effort. Cardiovascular system: normal S1/S2, RRR, no R pedal edema.   Gastrointestinal system: soft, NT, ND Central nervous system: A&O x self. no gross focal neurologic deficits, normal speech Extremities: new L AKA with intact occlusive dressing, no RLE edema Skin: dry, intact, normal temperature Psychiatry: normal mood, congruent affect, judgement and insight appear normal       Data Reviewed: Labs reviewed and notable for ---  Na 130 Cl 96 Glucose 147 Ca 8.8  Hbg 6.6 (tranfused) >> 8.5 >> 9.1 >> 8.4   Family Communication: None  Disposition: Status is: Inpatient Remains inpatient appropriate because: Underwent  AKA today, d/c pending clearance by vascular surgery.  May need SNF placement.   Planned Discharge Destination: Home with Home Health    Time spent: 42 minutes  Author: Pennie Banter, DO 03/29/2023 3:49 PM  For on call review [email or web address].

## 2023-03-29 NOTE — Anesthesia Preprocedure Evaluation (Signed)
Anesthesia Evaluation  Patient identified by MRN, date of birth, ID band Patient awake    Reviewed: Allergy & Precautions, NPO status , Patient's Chart, lab work & pertinent test results  History of Anesthesia Complications Negative for: history of anesthetic complications  Airway Mallampati: III  TM Distance: >3 FB Neck ROM: full    Dental  (+) Poor Dentition, Missing   Pulmonary neg shortness of breath, COPD, neg recent URI, Current Smoker   Pulmonary exam normal        Cardiovascular Exercise Tolerance: Poor hypertension, On Medications and On Home Beta Blockers (-) angina + CAD, + Peripheral Vascular Disease and +CHF  Normal cardiovascular exam  ECHO 06/01/2022 IMPRESSIONS     1. Left ventricular ejection fraction, by estimation, is 40 to 45%. The  left ventricle has mildly decreased function. The left ventricle  demonstrates global hypokinesis. There is severe concentric left  ventricular hypertrophy. Left ventricular diastolic   parameters are consistent with Grade I diastolic dysfunction (impaired  relaxation).   2. Right ventricular systolic function is normal. The right ventricular  size is normal.   3. 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  Left atrial size was severely dilated.   4. Right atrial size was moderately dilated.   5. The mitral valve is normal in structure. Mild mitral valve  regurgitation. No evidence of mitral stenosis.   6. The aortic valve is normal in structure. Aortic valve regurgitation is  not visualized. Aortic valve sclerosis/calcification is present, without  any evidence of aortic stenosis.   7. The inferior vena cava is normal in size with greater than 50%  respiratory variability, suggesting right atrial pressure of 3 mmHg.      Neuro/Psych Seizures -,  PSYCHIATRIC DISORDERS Anxiety Depression    CVA (L hemiparesis), Residual Symptoms    GI/Hepatic Neg  liver ROS,GERD  ,,  Endo/Other  negative endocrine ROS    Renal/GU      Musculoskeletal   Abdominal   Peds  Hematology  (+) Blood dyscrasia, anemia   Anesthesia Other Findings Past Medical History: No date: Allergy No date: COPD (chronic obstructive pulmonary disease) (HCC) No date: Coronary artery disease No date: Depression No date: Emphysema of lung (HCC) No date: GERD (gastroesophageal reflux disease) No date: Hyperlipidemia No date: Hypertension No date: Seizures (HCC) No date: Stroke [facility])  Past Surgical History: 09/08/2021: COLONOSCOPY WITH PROPOFOL; N/A     Comment:  Procedure: COLONOSCOPY WITH PROPOFOL;  Surgeon: [place],               Boykin Nearing, MD;  Location: [facility] ENDOSCOPY;  Service:               Gastroenterology;  Laterality: N/A; 08/10/2015: ESOPHAGOGASTRODUODENOSCOPY; N/A     Comment:  Procedure: ESOPHAGOGASTRODUODENOSCOPY (EGD);  Surgeon:               Scot Jun, MD;  Location: [facility] ENDOSCOPY;                Service: Endoscopy;  Laterality: N/A; 09/08/2021: ESOPHAGOGASTRODUODENOSCOPY; N/A     Comment:  Procedure: ESOPHAGOGASTRODUODENOSCOPY (EGD);  Surgeon:               Toledo, Boykin Nearing, MD;  Location: [facility] ENDOSCOPY;                Service: Gastroenterology;  Laterality: N/A; 12/11/2014: ESOPHAGOGASTRODUODENOSCOPY (EGD) WITH PROPOFOL; N/A     Comment:  Procedure: ESOPHAGOGASTRODUODENOSCOPY (EGD) WITH  PROPOFOL;  Surgeon: Midge Minium, MD;  Location: [facility]               ENDOSCOPY;  Service: Endoscopy;  Laterality: N/A; 12/29/2015: ESOPHAGOGASTRODUODENOSCOPY (EGD) WITH PROPOFOL; N/A     Comment:  Procedure: ESOPHAGOGASTRODUODENOSCOPY (EGD) WITH               PROPOFOL;  Surgeon: Napoleon Form, MD;  Location: [facility]              ENDOSCOPY;  Service: Endoscopy;  Laterality: N/A; 11/25/2020: LOWER EXTREMITY ANGIOGRAPHY; Left     Comment:  Procedure: Lower Extremity Angiography;  Surgeon: Annice Needy, MD;  Location: [facility]  INVASIVE CV LAB;  Service:               Cardiovascular;  Laterality: Left; 11/30/2020: LOWER EXTREMITY ANGIOGRAPHY; Left     Comment:  Procedure: Lower Extremity Angiography;  Surgeon: Annice Needy, MD;  Location: [facility] INVASIVE CV LAB;  Service:               Cardiovascular;  Laterality: Left; 05/31/2022: LOWER EXTREMITY ANGIOGRAPHY; Left     Comment:  Procedure: Lower Extremity Angiography;  Surgeon:               Renford Dills, MD;  Location: [facility] INVASIVE CV LAB;               Service: Cardiovascular;  Laterality: Left; 12/02/2020: TRANSMETATARSAL AMPUTATION; Left     Comment:  Procedure: TRANSMETATARSAL AMPUTATION;  Surgeon: Rosetta Posner, DPM;  Location: [facility] ORS;  Service: Podiatry;                Laterality: Left;  BMI    Body Mass Index: 21.13 kg/m      Reproductive/Obstetrics negative OB ROS                             Anesthesia Physical Anesthesia Plan  ASA: 4  Anesthesia Plan: General   Post-op Pain Management: Ofirmev IV (intra-op)*, Toradol IV (intra-op)* and Dilaudid IV   Induction: Intravenous  PONV Risk Score and Plan: 3 and Ondansetron, Dexamethasone and Treatment may vary due to age or medical condition  Airway Management Planned: Oral ETT  Additional Equipment:   Intra-op Plan:   Post-operative Plan: Extubation in OR  Informed Consent: I have reviewed the patients History and Physical, chart, labs and discussed the procedure including the risks, benefits and alternatives for the proposed anesthesia with the patient or authorized representative who has indicated his/her understanding and acceptance.     Dental Advisory Given  Plan Discussed with: Anesthesiologist, CRNA and Surgeon  Anesthesia Plan Comments: (Patient consented for risks of anesthesia including but not limited to:  - adverse reactions to medications - damage to eyes, teeth, lips or other oral mucosa - nerve damage due to  positioning  - sore throat or hoarseness - Damage to heart, brain, nerves, lungs, other parts of body or loss of life  Patient voiced understanding.)        Anesthesia Quick Evaluation

## 2023-03-29 NOTE — Evaluation (Signed)
OT Cancellation Note  Patient Details Name: Michele Matthews MRN: [id number] DOB: Mar 10, 1946   Cancelled Treatment:    Reason Eval/Treat Not Completed: Patient at procedure or test/ unavailable. Chart reviewed, pt having surgery today for L AKA. Will hold OT services until pt is post surgery and will attempt to see pt at a future date as medically appropriate when new or continued OT orders received.  Melana Hingle L. Sujata Maines, OTR/L  03/29/23, 8:56 AM

## 2023-03-29 NOTE — Op Note (Signed)
  [facility]   OPERATIVE NOTE   PROCEDURE:  Left above-the-knee amputation  PRE-OPERATIVE DIAGNOSIS: Left foot gangrene  POST-OPERATIVE DIAGNOSIS: same as above  SURGEON:  Renford Dills, MD  ASSISTANT(S): Rolla Plate, NP  ANESTHESIA: general  ESTIMATED BLOOD LOSS: 100 cc  FINDING(S): Muscle bellies appear viable  SPECIMEN(S):  Left above-the-knee amputation  INDICATIONS:   Michele Matthews is a 77 y.o. female who presents with left foot gangrene.  The patient is scheduled for a left above-the-knee amputation.  I discussed in depth with the patient the risks, benefits, and alternatives to this procedure.  The patient is aware that the risk of this operation included but are not limited to:  bleeding, infection, myocardial infarction, stroke, death, failure to heal amputation wound, and possible need for more proximal amputation.  The patient is aware of the risks and agrees proceed forward with the procedure.  DESCRIPTION: After full informed written consent was obtained from the patient, the patient was taken to the operating room, and placed supine upon the operating table.  Prior to induction, the patient received IV antibiotics.  The patient was then prepped and draped in the standard fashion for a left above-the-knee amputation.  After obtaining adequate anesthesia, the patient was prepped and draped in the standard fashion for a above-the-knee amputation.  I marked out the anterior and posterior flaps for a fish-mouth type of amputation.  I made the incisions for these flaps, and then dissected through the subcutaneous tissue, fascia, and muscles circumferentially.  I elevated  the periosteal tissue 4-5 cm more proximal than the anterior skin flap.  I then transected the femur with a power saw at this level.  Then I smoothed out the rough edges of the bone.  At this point, the specimen was passed off the field as the above-the-knee amputation.  At this  point, I clamped all visibly bleeding arteries and veins using a combination of suture ligation with silk suture and electrocautery.   Bleeding continued to be controlled with electrocautery and suture ligature.  The stump was washed off with sterile normal saline and no further active bleeding was noted.  I reapproximated the anterior and posterior fascia  with interrupted stitches of 0 Vicryl.  This was completed along the entire length of anterior and posterior fascia until there were no more loose space in the fascial line. The subcutaneous tissue was then approximated with 2-0 vicryl sutures. The skin was then  reapproximated with staples.  The stump was washed off and dried.  The incision was dressed with Xeroform and ABD pads, and  then fluffs were applied.  Kerlix was wrapped around the leg and then gently an ACE wrap was applied.  A large Ioban was then placed over the ACE wrap to secure the dressing. The patient was then awakened and take to the recovery room in stable condition.   COMPLICATIONS: none  CONDITION: stable  Levora Dredge  03/29/2023, 9:15 AM   This note was created with Dragon Medical transcription system. Any errors in dictation are purely unintentional.

## 2023-03-30 DIAGNOSIS — I998 Other disorder of circulatory system: Secondary | ICD-10-CM | POA: Diagnosis not present

## 2023-03-30 LAB — HEMOGLOBIN AND HEMATOCRIT, BLOOD
HCT: 23.4 % — ABNORMAL LOW (ref 36.0–46.0)
Hemoglobin: 6.7 g/dL — ABNORMAL LOW (ref 12.0–15.0)

## 2023-03-30 LAB — BASIC METABOLIC PANEL
Anion gap: 6 (ref 5–15)
BUN: 26 mg/dL — ABNORMAL HIGH (ref 8–23)
CO2: 28 mmol/L (ref 22–32)
Calcium: 9.1 mg/dL (ref 8.9–10.3)
Chloride: 100 mmol/L (ref 98–111)
Creatinine, Ser: 0.82 mg/dL (ref 0.44–1.00)
GFR, Estimated: >60 mL/min (ref >60)
Glucose, Bld: 103 mg/dL — ABNORMAL HIGH (ref 70–99)
Potassium: 4.3 mmol/L (ref 3.5–5.1)
Sodium: 134 mmol/L — ABNORMAL LOW (ref 135–145)

## 2023-03-30 LAB — PREPARE RBC (CROSSMATCH)

## 2023-03-30 LAB — CBC
HCT: 25.1 % — ABNORMAL LOW (ref 36.0–46.0)
Hemoglobin: 7.3 g/dL — ABNORMAL LOW (ref 12.0–15.0)
MCH: 21.7 pg — ABNORMAL LOW (ref 26.0–34.0)
MCHC: 29.1 g/dL — ABNORMAL LOW (ref 30.0–36.0)
MCV: 74.5 fL — ABNORMAL LOW (ref 80.0–100.0)
Platelets: 252 10*3/uL (ref 150–400)
RBC: 3.37 MIL/uL — ABNORMAL LOW (ref 3.87–5.11)
RDW: 26.5 % — ABNORMAL HIGH (ref 11.5–15.5)
WBC: 11.9 10*3/uL — ABNORMAL HIGH (ref 4.0–10.5)
nRBC: 0 % (ref 0.0–0.2)

## 2023-03-30 MED ORDER — SODIUM CHLORIDE 0.9% IV SOLUTION: Freq: Once | INTRAVENOUS | Status: AC

## 2023-03-30 NOTE — Progress Notes (Addendum)
Progress Note   [name]:[id number] DOB: [birth year] DOA: 03/21/2023     9 DOS: the patient was seen and examined on 03/30/2023   Brief hospital course:  HAELEE Matthews is a 77 y.o. female with medical history significant of hypertension, combined systolic and diastolic heart failure, COPD, hyperlipidemia, tobacco dependence who presents to the ED with chief complaint of left foot pain.  Patient states that she underwent a transmetatarsal amputation several years ago but it never quite healed properly.  2 weeks prior to presentation to [facility] noted increasing pain and a wound to the left foot.  Pain is gradually increased.  No fevers chills.  No nausea or vomiting.  Patient also endorsed a rash on the right side of her face consistent with shingles outbreak.  She denies pain or pruritus from this rash but states that she knows its there and is compelled to touch it.   On my evaluation patient is resting in bed.  She is in no distress but reports improvement after administration of pain medication.  Vital signs are stable.  Laboratory investigation significant for anemia with hemoglobin 6.4.  Patient did have mildly elevated lactic acid which improved after administration of intravenous fluids.     ED Course: Patient was given intravenous fluids, pain medication, IV antibiotics after culture data was taken.  Hospitalist contacted to admit under working diagnosis of sepsis secondary to infection cellulitis of left foot.    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  There appears to be anterior tibial runoff however this does not appear to cross the ankle. 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  Unable to assess left lower extremity due to pain.  Patient is status post left lower extremity angiography done 11/22  and percutaneous transluminal angioplasty of the right common iliac artery as well as percutaneous transluminal angioplasty and stent placement right external iliac artery Per vascular surgery "the patient has no further revascularization options with progressive limb ischemia and severe knee contracture.   --Vascular surgery managing --s/p Left AKA on 11/27 --Continue aspirin, Plavix and statin --Pain control PRN --PT/OT evaluations     Left foot pain Concern for left foot cellulitis on admission Left heel ulcer Left foot x-ray shows a new lucency within the soft tissues distal and distal lateral to the fifth metatarsal stump and distal to the second metatarsal stump. No definitive new adjacent cortical erosion is seen. Re demonstration of ulceration of the distal posterior heel. No definitive cortical erosion is seen within the adjacent distal posterior calcaneus to indicate radiographic evidence of acute osteomyelitis.  Appreciate Podiatry input. 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 At this time she has an area of necrotic tissue distal left fifth metatarsal region with ulceration to the distal medial aspect as well as a pressure ulcer of the left heel with drainage. Will await vascular evaluation but I do not believe there is very much from a limb salvage standpoint. I doubt the necrotic ulceration on the distal lateral fifth metatarsal will heal and she would still have this large heel decubitus ulceration  that will likely not heal. If they can establish better perfusion long term wound care can be performed. I do not recommend debridement of the left foot. Given her pain status I suspect she would be better off with a below the  knee or above-the-knee amputation" Patient has completed a 5-day course of IV Rocephin.  11/27 - pt underwent LEFT AKA with Dr. Gilda Crease    Transient Hypotension - resolved  11/27 during surgery and post-op. Briefly required Levophed, but since weaned off and MAPs are stable --Cancel transfer to stepdown unit --Maintain MAP > 65 --Added hold parameters to antihypertensive meds  Hyponatremia - mild, likely hypovolemic while NPO for surgery. Na 130 on 11/27 (day of surgery), down from 134 >> 132 previously. Improved, Na 134 --Monitor BMP for now   Acute on chronic anemia History of iron deficiency anemia Hemoglobin on arrival 6.4.  Guaiac negative in ED.   No evidence of acute blood loss.  Patient not adherent to home iron therapies. Transfused 2 units of packed RBC with improvement in her H&H since her hospitalization  11/28 - POD-1 from L AKA, Hbg 7.3 >> 6.7 on afternoon repeat --Transfuse 1 unit RBC's today, post-transfusion H&H --Monitor CBC   Facial zoster Present on right side of face.   Patient denies any visual disturbance. Rash has crusted over and is in different stages of healing --Continue Valtrex --Continue airborne and contact precautions   Chronic systolic diastolic congestive heart failure LVEF 40 to 45% Appears euvolemic at this time. --Continue carvedilol and furosemide --Continue Cozaar   COPD, unspecified type/severity Active smoker Patient started smoking at age 47.  Not ready to quit.  ' Smoking cessation was discussed with patient in detail --Continue nicotine transdermal patch   Essential hypertension Improved blood pressure control" on Cozaar and carvedilol   Generalized anxiety disorder Depression Stable Continue sertraline and trazodone   Stage IIIa chronic kidney disease Stable Monitor renal function closely                Subjective: pt seen awake resting in bed this AM.  She reports moderate pain.  Denies any other  complaints. No SOB, CP, F/C, N/V.  Physical Exam: Vitals:   03/29/23 1559 03/30/23 0128 03/30/23 0417 03/30/23 0845  BP: 107/64 106/66 110/69 119/60  Pulse: 84 96 85 82  Resp: 16 20 20 14   Temp: 98.4 F (36.9 C) 98.2 F (36.8 C) (!) 97.5 F (36.4 C)   TempSrc:      SpO2: 100% 99% 99% 97%  Weight:      Height:       General exam: awake, alert, no acute distress HEENT: poor dentition, moist mucus membranes, hearing grossly normal  Respiratory system: CTAB anteriorly, no wheezes, rales or rhonchi, normal respiratory effort. Cardiovascular system: normal S1/S2, RRR, no R pedal edema.   Gastrointestinal system: soft, NT, ND Central nervous system: A&O x self. no gross focal neurologic deficits, normal speech Extremities: new L AKA with intact occlusive dressing, no RLE edema Skin: dry, intact, normal temperature Psychiatry: normal mood, congruent affect, judgement and insight appear normal       Data Reviewed: Labs reviewed and notable for ---  Na 134 BUN 26 Glucose 103   Hbg 6.6 (tranfused) >> 8.5 >> 9.1 >> 8.4 >> 7.3 >> 6.7 (transfusion this AM)    Family Communication: None  Disposition: Status is: Inpatient Remains inpatient appropriate because: Underwent AKA 11/27, d/c pending clearance by vascular surgery.  Anticipate need for SNF placement.  Planned Discharge Destination: Home with Home Health vs SNF given s/p AKA     Time spent: 36 minutes  Author: Pennie Banter, DO 03/30/2023 1:43 PM  For on call review [email or web address].

## 2023-03-30 NOTE — Plan of Care (Signed)
  Problem: Fluid Volume: Goal: Hemodynamic stability will improve Outcome: Progressing   Problem: Clinical Measurements: Goal: Diagnostic test results will improve Outcome: Progressing Goal: Signs and symptoms of infection will decrease Outcome: Progressing   Problem: Respiratory: Goal: Ability to maintain adequate ventilation will improve Outcome: Progressing   Problem: Education: Goal: Knowledge of General Education information will improve Description: Including pain rating scale, medication(s)/side effects and non-pharmacologic comfort measures Outcome: Progressing   Problem: Health Behavior/Discharge Planning: Goal: Ability to manage health-related needs will improve Outcome: Progressing   Problem: Clinical Measurements: Goal: Ability to maintain clinical measurements within normal limits will improve Outcome: Progressing Goal: Will remain free from infection Outcome: Progressing Goal: Diagnostic test results will improve Outcome: Progressing Goal: Respiratory complications will improve Outcome: Progressing Goal: Cardiovascular complication will be avoided Outcome: Progressing   Problem: Activity: Goal: Risk for activity intolerance will decrease Outcome: Progressing   Problem: Nutrition: Goal: Adequate nutrition will be maintained Outcome: Progressing   Problem: Coping: Goal: Level of anxiety will decrease Outcome: Progressing   Problem: Elimination: Goal: Will not experience complications related to bowel motility Outcome: Progressing Goal: Will not experience complications related to urinary retention Outcome: Progressing   Problem: Pain Management: Goal: General experience of comfort will improve Outcome: Progressing   Problem: Safety: Goal: Ability to remain free from injury will improve Outcome: Progressing   Problem: Skin Integrity: Goal: Risk for impaired skin integrity will decrease Outcome: Progressing   Problem: Education: Goal:  Understanding of CV disease, CV risk reduction, and recovery process will improve Outcome: Progressing Goal: Individualized Educational Video(s) Outcome: Progressing   Problem: Activity: Goal: Ability to return to baseline activity level will improve Outcome: Progressing   Problem: Cardiovascular: Goal: Ability to achieve and maintain adequate cardiovascular perfusion will improve Outcome: Progressing Goal: Vascular access site(s) Level 0-1 will be maintained Outcome: Progressing   Problem: Health Behavior/Discharge Planning: Goal: Ability to safely manage health-related needs after discharge will improve Outcome: Progressing

## 2023-03-30 NOTE — Progress Notes (Signed)
Subjective  - POD #1, s/p left AKA  C/o pain a t stump site   Physical Exam:  Dressing clean and dry       Assessment/Plan:  POD #1  Plan on dressing change in a few days Work on pain control PT to eval  Michele Matthews 03/30/2023 10:50 AM --  Vitals:   03/30/23 0417 03/30/23 0845  BP: 110/69 119/60  Pulse: 85 82  Resp: 20 14  Temp: (!) 97.5 F (36.4 C)   SpO2: 99% 97%    Intake/Output Summary (Last 24 hours) at 03/30/2023 1050 Last data filed at 03/30/2023 0600 Gross per 24 hour  Intake 120 ml  Output 200 ml  Net -80 ml     Laboratory CBC    Component Value Date/Time   WBC 11.9 (H) 03/30/2023 0624   HGB 7.3 (L) 03/30/2023 0624   HGB 12.1 03/27/2014 1730   HCT 25.1 (L) 03/30/2023 0624   HCT 37.0 03/27/2014 1730   PLT 252 03/30/2023 0624   PLT 175 03/27/2014 1730    BMET    Component Value Date/Time   NA 134 (L) 03/30/2023 0624   NA 140 03/28/2014 0441   K 4.3 03/30/2023 0624   K 4.0 03/28/2014 0441   CL 100 03/30/2023 0624   CL 110 (H) 03/28/2014 0441   CO2 28 03/30/2023 0624   CO2 24 03/28/2014 0441   GLUCOSE 103 (H) 03/30/2023 0624   GLUCOSE 86 03/28/2014 0441   BUN 26 (H) 03/30/2023 0624   BUN 19 (H) 03/28/2014 0441   CREATININE 0.82 03/30/2023 0624   CREATININE 0.79 11/24/2015 0908   CALCIUM 9.1 03/30/2023 0624   CALCIUM 8.7 03/28/2014 0441   GFRNONAA >60 03/30/2023 0624   GFRNONAA 76 11/24/2015 0908   GFRAA >60 07/28/2017 0507   GFRAA 87 11/24/2015 0908    COAG Lab Results  Component Value Date   INR 1.0 05/26/2022   INR 1.2 09/05/2021   INR 1.0 11/25/2020   No results found for: "PTT"  Antibiotics Anti-infectives (From admission, onward)    Start     Dose/Rate Route Frequency Ordered Stop   03/29/23 0600  ceFAZolin (ANCEF) IVPB 2g/100 mL premix        2 g 200 mL/hr over 30 Minutes Intravenous On call 03/29/23 0218 03/29/23 0809   03/24/23 1045  ceFAZolin (ANCEF) IVPB 2g/100 mL premix  Status:  Discontinued         2 g 200 mL/hr over 30 Minutes Intravenous 30 min pre-op 03/24/23 1045 03/24/23 1453   03/22/23 1200  vancomycin (VANCOREADY) IVPB 750 mg/150 mL  Status:  Discontinued        750 mg 150 mL/hr over 60 Minutes Intravenous Every 24 hours 03/21/23 1535 03/23/23 1147   03/21/23 2200  valACYclovir (VALTREX) tablet 500 mg        500 mg Oral 2 times daily 03/21/23 1507     03/21/23 1600  cefTRIAXone (ROCEPHIN) 2 g in sodium chloride 0.9 % 100 mL IVPB        2 g 200 mL/hr over 30 Minutes Intravenous Every 24 hours 03/21/23 1506 03/27/23 1759   03/21/23 1515  vancomycin (VANCOCIN) IVPB 1000 mg/200 mL premix  Status:  Discontinued        1,000 mg 200 mL/hr over 60 Minutes Intravenous  Once 03/21/23 1506 03/21/23 1535   03/21/23 1215  vancomycin (VANCOCIN) IVPB 1000 mg/200 mL premix        1,000 mg 200 mL/hr  over 60 Minutes Intravenous  Once 03/21/23 1210 03/21/23 1321        V. Charlena Cross, M.D., [facility] [place] Office: [phone] Pager:  [phone]

## 2023-03-30 NOTE — Plan of Care (Signed)
  Problem: Fluid Volume: Goal: Hemodynamic stability will improve Outcome: Progressing   Problem: Clinical Measurements: Goal: Signs and symptoms of infection will decrease Outcome: Progressing   Problem: Activity: Goal: Risk for activity intolerance will decrease Outcome: Progressing   Problem: Elimination: Goal: Will not experience complications related to bowel motility Outcome: Progressing   Problem: Pain Management: Goal: General experience of comfort will improve Outcome: Progressing

## 2023-03-31 ENCOUNTER — Inpatient Hospital Stay: Payer: Medicare HMO

## 2023-03-31 DIAGNOSIS — I6930 Unspecified sequelae of cerebral infarction: Secondary | ICD-10-CM

## 2023-03-31 DIAGNOSIS — I739 Peripheral vascular disease, unspecified: Secondary | ICD-10-CM | POA: Diagnosis not present

## 2023-03-31 DIAGNOSIS — I998 Other disorder of circulatory system: Secondary | ICD-10-CM | POA: Diagnosis not present

## 2023-03-31 LAB — CBC
HCT: 29.5 % — ABNORMAL LOW (ref 36.0–46.0)
Hemoglobin: 9.1 g/dL — ABNORMAL LOW (ref 12.0–15.0)
MCH: 23.4 pg — ABNORMAL LOW (ref 26.0–34.0)
MCHC: 30.8 g/dL (ref 30.0–36.0)
MCV: 75.8 fL — ABNORMAL LOW (ref 80.0–100.0)
Platelets: 240 10*3/uL (ref 150–400)
RBC: 3.89 MIL/uL (ref 3.87–5.11)
RDW: 25.2 % — ABNORMAL HIGH (ref 11.5–15.5)
WBC: 8.7 10*3/uL (ref 4.0–10.5)
nRBC: 0 % (ref 0.0–0.2)

## 2023-03-31 LAB — TYPE AND SCREEN
ABO/RH(D): O NEG
Antibody Screen: NEGATIVE
Unit division: 0
Unit division: 0
Unit division: 0
Unit division: 0
Unit division: 0

## 2023-03-31 LAB — BPAM RBC
Blood Product Expiration Date: 202411272359
Blood Product Expiration Date: 202412012359
Blood Product Expiration Date: 202412012359
Blood Product Expiration Date: 202412022359
Blood Product Expiration Date: 202412192359
ISSUE DATE / TIME: 202411251401
ISSUE DATE / TIME: 202411260000
ISSUE DATE / TIME: 202411281550
ISSUE DATE / TIME: 202411291311
Unit Type and Rh: 9500
Unit Type and Rh: 9500
Unit Type and Rh: 9500
Unit Type and Rh: 9500
Unit Type and Rh: 9500

## 2023-03-31 LAB — HEMOGLOBIN AND HEMATOCRIT, BLOOD
HCT: 29.3 % — ABNORMAL LOW (ref 36.0–46.0)
Hemoglobin: 8.8 g/dL — ABNORMAL LOW (ref 12.0–15.0)

## 2023-03-31 LAB — BASIC METABOLIC PANEL
Anion gap: 9 (ref 5–15)
BUN: 25 mg/dL — ABNORMAL HIGH (ref 8–23)
CO2: 27 mmol/L (ref 22–32)
Calcium: 8.5 mg/dL — ABNORMAL LOW (ref 8.9–10.3)
Chloride: 101 mmol/L (ref 98–111)
Creatinine, Ser: 0.77 mg/dL (ref 0.44–1.00)
GFR, Estimated: >60 mL/min (ref >60)
Glucose, Bld: 78 mg/dL (ref 70–99)
Potassium: 3.8 mmol/L (ref 3.5–5.1)
Sodium: 137 mmol/L (ref 135–145)

## 2023-03-31 LAB — SURGICAL PATHOLOGY

## 2023-03-31 MED ORDER — DICLOFENAC SODIUM 1 % EX GEL
2.0000 g | Freq: Three times a day (TID) | CUTANEOUS | Status: DC | PRN
  Filled 2023-03-31: qty 100

## 2023-03-31 MED ORDER — LORAZEPAM 2 MG/ML IJ SOLN
0.5000 mg | Freq: Once | INTRAMUSCULAR | Status: AC
  Administered 2023-03-31: 0.5 mg via INTRAVENOUS
  Filled 2023-03-31: qty 1

## 2023-03-31 NOTE — Evaluation (Signed)
Occupational Therapy Evaluation Patient Details Name: Michele Matthews MRN: [id number] DOB: 22-Jan-1946 Today's Date: 03/31/2023   History of Present Illness Pt is a 77 y.o. F with medical history significant of HTN, combined systolic and diastolic heart failure, COPD, HLD, tobacco dependence, presented with c/c of L foot pain, now s/p L AKA. MD assessment includes PAD, acute on chronic anemia, facial zoster (inactive), anxiety disorder, depression, and CKD. 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   Clinical Impression   Pt was seen for OT re-evaluation and attempted co-tx with PT this date. Pt received in bed, attempting to reach her breakfast tray that was adjacent to the bed. Pt endorses only limited pain during session but notes nausea (not as bad as it was earlier in the morning), unable to see out of her R eye, R hand numbness/tingling, and difficulty using LUE to pull the covers over her shoulders. With assessment, questionable unilateral deficits in strength and coordination appreciated. Possible L facial droop. Pt also noted to pocket some of her eggs. Pt found to have decreased hip extension on LLE and unable to fully relax residual limb on the bed even with physical assist. Pt declining EOB/OOB ADL/mobility despite encouragement and education in benefits post-amputation. RN notified of symptoms.   Pt presents to acute OT demonstrating impaired ADL performance and functional mobility 2/2 decreased strength, ROM, balance, coordination, sensation, vision, and cognition (See OT problem list for additional functional deficits). Pt currently requires MAX A for bed level LB ADL and toileting, MIN A For bed level UB ADL tasks. Pt would benefit from skilled OT services to address noted impairments and functional limitations (see below for any additional details) in order to maximize safety and independence while minimizing falls risk and caregiver burden.     If plan is discharge home, recommend the following:  Assistance with cooking/housework;Assist for transportation;Help with stairs or ramp for entrance;A lot of help with walking and/or transfers;A lot of help with bathing/dressing/bathroom;Direct supervision/assist for medications management    Functional Status Assessment  Patient has had a recent decline in their functional status and demonstrates the ability to make significant improvements in function in a reasonable and predictable amount of time.  Equipment Recommendations  Other (comment) (defer; if returns home would require w/c, drop arm BSC)    Recommendations for Other Services       Precautions / Restrictions Precautions Precautions: Fall Restrictions Weight Bearing Restrictions: Yes LLE Weight Bearing: Non weight bearing Other Position/Activity Restrictions: L AKA      Mobility Bed Mobility               General bed mobility comments: pt declined EOB or repositioning and unable to boost herself up without assist but pt declined    Transfers                          Balance                                           ADL either performed or assessed with clinical judgement   ADL Overall ADL's : Needs assistance/impaired Eating/Feeding: Bed level;Set up  Vision Baseline Vision/History: 0 No visual deficits Ability to See in Adequate Light: 4 Severely impaired Patient Visual Report: Other (comment) Vision Assessment?: Vision impaired- to be further tested in functional context;Yes Eye Alignment: Within Functional Limits Alignment/Gaze Preference: Within Defined Limits Additional Comments: Pt reports full vision loss in R eye     Perception         Praxis         Pertinent Vitals/Pain Pain Assessment Pain Assessment: Faces Faces Pain Scale: Hurts a little bit Pain Location: L hip Pain Descriptors / Indicators: Discomfort, Sore Pain Intervention(s): Monitored  during session, Repositioned, Premedicated before session     Extremity/Trunk Assessment Upper Extremity Assessment Upper Extremity Assessment: Generalized weakness (grossly weak and impaired FMC bilaterally but L very slightly worse than R. Pt also notes R hand to be numb/tingly but reports having difficulty using her LUE.)   Lower Extremity Assessment Lower Extremity Assessment: Generalized weakness;LLE deficits/detail LLE Deficits / Details: s/p AKA, notable stiffness at L hip/remaining in slight flexion unable to achieve neutral flat positioning       Communication Communication Communication: Hearing impairment Cueing Techniques: Verbal cues;Tactile cues;Visual cues   Cognition Arousal: Alert Behavior During Therapy: WFL for tasks assessed/performed, Agitated, Restless Overall Cognitive Status: No family/caregiver present to determine baseline cognitive functioning                                 General Comments: Very HOH, difficulty describing how she was feeling     General Comments  Pt exhibiting concerning symptoms resulting in screening - L finger<->nose test slightly worse than R, mild decrease in L facial muscle activation compared to R, reports of little-no vision in R eye, some numbness in R UE though present from some time ago. Claims she had been up in chair previously for lunch    Exercises     Shoulder Instructions      Home Living Family/patient expects to be discharged to:: Private residence Living Arrangements: Spouse/significant other Available Help at Discharge: Family Type of Home: House Home Access: Stairs to enter Secretary/administrator of Steps: 3 Entrance Stairs-Rails: Right Home Layout: One level     Bathroom Shower/Tub: Chief Strategy Officer: Standard Bathroom Accessibility: Yes   Home Equipment: Agricultural consultant (2 wheels);Rollator (4 wheels);Tub bench;Wheelchair - manual;Cane - single point;Shower seat;BSC/3in1    Additional Comments: received per chart review      Prior Functioning/Environment Prior Level of Function : Needs assist       Physical Assist : ADLs (physical)   ADLs (physical): Bathing Mobility Comments: previously reported Mod I household ambulator with rollator; unable to gather fall hx ADLs Comments: previously reported that husband assists with bathing and other ADLs prn, ind with dressing        OT Problem List: Decreased strength;Decreased range of motion;Decreased coordination;Decreased activity tolerance;Decreased safety awareness;Impaired balance (sitting and/or standing);Impaired vision/perception;Impaired UE functional use;Impaired sensation;Pain      OT Treatment/Interventions: Self-care/ADL training;Therapeutic exercise;Therapeutic activities;Energy conservation;DME and/or AE instruction;Patient/family education;Neuromuscular education;Balance training;Visual/perceptual remediation/compensation    OT Goals(Current goals can be found in the care plan section) Acute Rehab OT Goals Patient Stated Goal: go home OT Goal Formulation: With patient Time For Goal Achievement: 04/14/23 Potential to Achieve Goals: Fair ADL Goals Pt Will Transfer to Toilet: stand pivot transfer;with transfer board;bedside commode;with mod assist (LRAD)  OT Frequency: Min 1X/week    Co-evaluation  AM-PAC OT "6 Clicks" Daily Activity     Outcome Measure Help from another person eating meals?: None Help from another person taking care of personal grooming?: A Little Help from another person toileting, which includes using toliet, bedpan, or urinal?: A Lot Help from another person bathing (including washing, rinsing, drying)?: A Lot Help from another person to put on and taking off regular upper body clothing?: A Little Help from another person to put on and taking off regular lower body clothing?: A Lot 6 Click Score: 16   End of Session Nurse Communication: Mobility  status;Other (comment) (questionable unilateral deficits)  Activity Tolerance: Other (comment) (pt self limiting) Patient left: in bed;with call bell/phone within reach;with bed alarm set  OT Visit Diagnosis: Unsteadiness on feet (R26.81);Other abnormalities of gait and mobility (R26.89);Muscle weakness (generalized) (M62.81);Repeated falls (R29.6)                Time: 2536-6440 OT Time Calculation (min): 20 min Charges:  OT Evaluation $OT Re-eval: 1 Re-eval  Arman Filter., MPH, MS, OTR/L ascom [phone] 03/31/23, 1:19 PM

## 2023-03-31 NOTE — TOC Progression Note (Addendum)
Transition of Care [facility]) - Progression Note    Patient Details  Name: Michele Matthews MRN: [id number] Date of Birth: 1946-02-23  Transition of Care [facility]) CM/SW Contact  Liliana Cline, LCSW Phone Number: 03/31/2023, 10:32 AM  Clinical Narrative:    Referral made to Jon with [employer] for wheelchair to be delivered to the room.  11:07- Call from Jon with [employer] that there is a hold on patient's account and they cannot follow up until Monday.   Expected Discharge Plan: Home w Home Health Services Barriers to Discharge: Continued Medical Work up  Expected Discharge Plan and Services       Living arrangements for the past 2 months: Single Family Home                           HH Arranged: PT, OT, RN, Nurse's Aide HH Agency: [facility] Date [facility] Agency Contacted: 03/25/23   Representative spoke with at [facility] Agency: Laurelyn Sickle   Social Determinants of Health (SDOH) Interventions SDOH Screenings   Food Insecurity: No Food Insecurity (03/21/2023)  Housing: Low Risk  (03/21/2023)  Transportation Needs: No Transportation Needs (03/21/2023)  Utilities: Not At Risk (03/21/2023)  Financial Resource Strain: Medium Risk (11/08/2021)  Tobacco Use: High Risk (03/29/2023)    Readmission Risk Interventions    06/14/2022    8:47 AM  Readmission Risk Prevention Plan  Transportation Screening Complete  Medication Review (RN Care Manager) Complete  PCP or Specialist appointment within 3-5 days of discharge Complete  HRI or Home Care Consult Complete  SW Recovery Care/Counseling Consult Complete  Palliative Care Screening Not Applicable  Skilled Nursing Facility Not Applicable

## 2023-03-31 NOTE — Plan of Care (Signed)
  Problem: Fluid Volume: Goal: Hemodynamic stability will improve Outcome: Progressing   Problem: Clinical Measurements: Goal: Diagnostic test results will improve Outcome: Progressing Goal: Signs and symptoms of infection will decrease Outcome: Progressing   Problem: Respiratory: Goal: Ability to maintain adequate ventilation will improve Outcome: Progressing   Problem: Education: Goal: Knowledge of General Education information will improve Description: Including pain rating scale, medication(s)/side effects and non-pharmacologic comfort measures Outcome: Progressing   Problem: Health Behavior/Discharge Planning: Goal: Ability to manage health-related needs will improve Outcome: Progressing   Problem: Clinical Measurements: Goal: Ability to maintain clinical measurements within normal limits will improve Outcome: Progressing Goal: Will remain free from infection Outcome: Progressing Goal: Diagnostic test results will improve Outcome: Progressing Goal: Respiratory complications will improve Outcome: Progressing Goal: Cardiovascular complication will be avoided Outcome: Progressing   Problem: Activity: Goal: Risk for activity intolerance will decrease Outcome: Progressing   Problem: Nutrition: Goal: Adequate nutrition will be maintained Outcome: Progressing   Problem: Coping: Goal: Level of anxiety will decrease Outcome: Progressing   Problem: Elimination: Goal: Will not experience complications related to bowel motility Outcome: Progressing Goal: Will not experience complications related to urinary retention Outcome: Progressing   Problem: Pain Management: Goal: General experience of comfort will improve Outcome: Progressing   Problem: Safety: Goal: Ability to remain free from injury will improve Outcome: Progressing   Problem: Skin Integrity: Goal: Risk for impaired skin integrity will decrease Outcome: Progressing   Problem: Education: Goal:  Understanding of CV disease, CV risk reduction, and recovery process will improve Outcome: Progressing Goal: Individualized Educational Video(s) Outcome: Progressing   Problem: Activity: Goal: Ability to return to baseline activity level will improve Outcome: Progressing   Problem: Cardiovascular: Goal: Ability to achieve and maintain adequate cardiovascular perfusion will improve Outcome: Progressing Goal: Vascular access site(s) Level 0-1 will be maintained Outcome: Progressing   Problem: Health Behavior/Discharge Planning: Goal: Ability to safely manage health-related needs after discharge will improve Outcome: Progressing

## 2023-03-31 NOTE — Progress Notes (Signed)
Progress Note   [name]:[id number] DOB: Sep 13, 1945 DOA: 03/21/2023     10 DOS: the patient was seen and examined on 03/31/2023   Brief hospital course:  Michele Matthews is a 77 y.o. female with medical history significant of hypertension, combined systolic and diastolic heart failure, COPD, hyperlipidemia, tobacco dependence who presents to the ED with chief complaint of left foot pain.  Patient states that she underwent a transmetatarsal amputation several years ago but it never quite healed properly.  2 weeks prior to presentation to [facility] noted increasing pain and a wound to the left foot.  Pain is gradually increased.  No fevers chills.  No nausea or vomiting.  Patient also endorsed a rash on the right side of her face consistent with shingles outbreak.  She denies pain or pruritus from this rash but states that she knows its there and is compelled to touch it.   On my evaluation patient is resting in bed.  She is in no distress but reports improvement after administration of pain medication.  Vital signs are stable.  Laboratory investigation significant for anemia with hemoglobin 6.4.  Patient did have mildly elevated lactic acid which improved after administration of intravenous fluids.     ED Course: Patient was given intravenous fluids, pain medication, IV antibiotics after culture data was taken.  Hospitalist contacted to admit under working diagnosis of sepsis secondary to infection cellulitis of left foot.    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  There appears to be anterior tibial runoff however this does not appear to cross the ankle. 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  Unable to assess left lower extremity due to pain.  Patient is status post left lower extremity angiography done 11/22  and percutaneous transluminal angioplasty of the right common iliac artery as well as percutaneous transluminal angioplasty and stent placement right external iliac artery Per vascular surgery "the patient has no further revascularization options with progressive limb ischemia and severe knee contracture.   --Vascular surgery managing --s/p Left AKA on 11/27 --Continue aspirin, Plavix and statin --Pain control PRN --PT/OT evaluations     Left foot pain Concern for left foot cellulitis on admission Left heel ulcer Left foot x-ray shows a new lucency within the soft tissues distal and distal lateral to the fifth metatarsal stump and distal to the second metatarsal stump. No definitive new adjacent cortical erosion is seen. Re demonstration of ulceration of the distal posterior heel. No definitive cortical erosion is seen within the adjacent distal posterior calcaneus to indicate radiographic evidence of acute osteomyelitis.  Appreciate Podiatry input: "Patient has a longstanding ulcer on her left transmetatarsal amputation site that has failed to heal. At this time she has an area of necrotic tissue distal left fifth metatarsal region with ulceration to the distal medial aspect as well as a pressure ulcer of the left heel with drainage. Will await vascular evaluation but I do not believe there is very much from a limb salvage standpoint. I doubt the necrotic ulceration on the distal lateral fifth metatarsal will heal and she would still have this large heel decubitus ulceration  that will likely not heal. If they can establish better perfusion long term wound care can be performed. I do not recommend debridement of the left foot. Given her pain status I suspect she would be better off with a below the  knee or above-the-knee amputation"  Completed a 5-day course of IV Rocephin. 11/27 - pt underwent LEFT AKA with Dr. Gilda Matthews    Transient Hypotension - resolved  11/27 during surgery and post-op. Briefly required Levophed, but since weaned off and MAPs are stable --Cancel transfer to stepdown unit --Maintain MAP > 65 --Added hold parameters to antihypertensive meds  Hyponatremia - mild, likely hypovolemic while NPO for surgery. Na 130 on 11/27 (day of surgery), down from 134 >> 132 previously. Improved, Na 134 --Monitor BMP for now   Acute on chronic anemia History of iron deficiency anemia Hemoglobin on arrival 6.4.  Guaiac negative in ED.   No evidence of acute blood loss.  Patient not adherent to home iron therapies. Transfused total of 3 units of packed RBC (last on 11/28) --Trend Hbg and transfuse if < 7 --Monitor CBC   Facial zoster Present on right side of face.   Patient denies any visual disturbance. Rash has crusted over and is in different stages of healing --Continue Valtrex --Continue airborne and contact precautions   Chronic systolic diastolic congestive heart failure LVEF 40 to 45% Appears euvolemic at this time. --Continue carvedilol and furosemide --Continue Cozaar  Hx of CVA with residual left-sided weakness Pt's husband reports this is at baseline.  Physical therapy and nursing expressed possibly worsened weakness.   --CT head and MRI brain ordered  --Pt reportedly refused the MRI --Continue to monitor for neurologic status --Continue ASA/Plavix, statin   COPD, unspecified type/severity - stable, not exacerbated Active smoker Patient started smoking at age 78.  Not ready to quit.   Smoking cessation was discussed with patient in detail --Continue nicotine patch   Essential hypertension Improved blood pressure control" on Cozaar and carvedilol   Generalized anxiety disorder Depression Stable Continue sertraline and trazodone   Stage IIIa  chronic kidney disease Stable Monitor renal function closely                 Subjective: Pt seen with husband at bedside today.  Pt reports feeling okay.  Pain overall controlled for the most part.  Husband states pt has always been weak one side more than other due to her prior strokes.  He does not feel this is any different than her baseline currently.    Physical Exam: Vitals:   03/30/23 1747 03/30/23 1845 03/30/23 2317 03/31/23 0753  BP: (!) 98/56 106/68 114/68 (!) 163/75  Pulse:  85 92 84  Resp:  16 17 14   Temp:  97.9 F (36.6 C) 97.7 F (36.5 C) 98.2 F (36.8 C)  TempSrc:  Oral  Oral  SpO2:  96% 93% 97%  Weight:      Height:       General exam: awake, drowsy, no acute distress HEENT: poor dentition, moist mucus membranes, hard of hearing  Respiratory system: CTAB, no wheezes, rales or rhonchi, normal respiratory effort. Cardiovascular system: normal S1/S2, RRR, no R pedal edema.   Gastrointestinal system: soft, NT, ND Central nervous system: A&O x 2+. no gross focal neurologic deficits, normal speech Extremities: L AKA with intact occlusive dressing, no RLE edema Skin: dry, intact, normal temperature Psychiatry: normal mood, congruent affect, judgement and insight appear normal       Data Reviewed:  Labs reviewed and notable for ---  BUN 25 Ca 8.5   Hbg trend -- 6.6 (tranfused) >> 8.5 >> 9.1 >> 8.4 >> 7.3  >> 6.7 (11/28 transfused 1 unit pRBC's) >> 8.8 >> 9.1    Family Communication: husband at bedside on rounds today, updated & questions answered  Disposition: Status is: Inpatient Remains inpatient appropriate because: Underwent AKA 11/27, d/c pending clearance by vascular surgery.  Anticipate need for SNF placement.   Planned Discharge Destination: Home with Home Health vs SNF given s/p AKA     Time spent: 42 minutes  Author: Pennie Banter, DO 03/31/2023 2:23 PM  For on call review [email or web address].

## 2023-03-31 NOTE — Consult Note (Signed)
[facility] Liaison Note  03/31/2023  Michele Matthews 07-01-1945 [id number]  Location: RN Hospital Liaison screened the patient remotely at [facility].  Insurance: [facility] HMO   Michele Matthews is a 77 y.o. female who is a Primary Care Patient of Pcp, No The patient was screened for readmission hospitalization with noted medium risk score for unplanned readmission risk with 1 IP in 6 months.  The patient was assessed for potential Care Management service needs for post hospital transition for care coordination. Review of patient's electronic medical record reveals patient was admitted for Sepsis. Underwent surgery on 03/29/23. No PCP listed with recommendation for SNF level of care when medically stable.   Plan: [facility] Liaison will continue to follow progress and disposition to asess for post hospital community care coordination/management needs.  Referral request for community care coordination: pending disposition.   VBCI Care Management/Population Health does not replace or interfere with any arrangements made by the Inpatient Transition of Care team.   For questions contact:   Elliot Cousin, RN, [facility] Liaison [place]   [facility], Population Health Office Hours MTWF  8:00 am-6:00 pm Direct Dial: [phone] mobile [phone] [Office toll free line] Office Hours are M-F 8:30 - 5 pm [name]

## 2023-03-31 NOTE — TOC CM/SW Note (Signed)
    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  A cane, crutch, or walker will not resolve  issue with performing activities of daily living. A wheelchair will allow patient to safely perform daily activities. Patient is not able to propel themselves in the home using a standard weight wheelchair due to arm weakness, endurance, and general weakness. Patient can self propel in the lightweight wheelchair. Length of need Lifetime. Accessories: elevating leg rests (ELRs), wheel locks, extensions and anti-tippers.   03/31/23 1031

## 2023-03-31 NOTE — TOC Progression Note (Signed)
Transition of Care [facility]) - Progression Note    Patient Details  Name: Michele Matthews MRN: [id number] Date of Birth: Sep 09, 1945  Transition [facility] Care [facility]) CM/SW Contact  Marlowe Sax, RN Phone Number: 03/31/2023, 9:41 AM  Clinical Narrative:    Spoke with the husband Darrell at his request, he stated that she will need a light weight Manual wheelchair prior to DC, I notified the [facility] team member of the need   Expected Discharge Plan: Home w Home Health Services Barriers to Discharge: Continued Medical Work up  Expected Discharge Plan and Services       Living arrangements for the past 2 months: Single Family Home                           HH Arranged: PT, OT, RN, Nurse's Aide HH Agency: [facility] Date [facility] Agency Contacted: 03/25/23   Representative spoke with at [facility] Agency: Laurelyn Sickle   Social Determinants of Health (SDOH) Interventions SDOH Screenings   Food Insecurity: No Food Insecurity (03/21/2023)  Housing: Low Risk  (03/21/2023)  Transportation Needs: No Transportation Needs (03/21/2023)  Utilities: Not At Risk (03/21/2023)  Financial Resource Strain: Medium Risk (11/08/2021)  Tobacco Use: High Risk (03/29/2023)    Readmission Risk Interventions    06/14/2022    8:47 AM  Readmission Risk Prevention Plan  Transportation Screening Complete  Medication Review (RN Care Manager) Complete  PCP or Specialist appointment within 3-5 days of discharge Complete  HRI or Home Care Consult Complete  SW Recovery Care/Counseling Consult Complete  Palliative Care Screening Not Applicable  Skilled Nursing Facility Not Applicable

## 2023-03-31 NOTE — Evaluation (Signed)
Physical Therapy Re-Evaluation Patient Details Name: Michele Matthews MRN: [id number] DOB: 06-01-1945 Today's Date: 03/31/2023  History of Present Illness  Pt is a 77 y.o. F with medical history significant of HTN, combined systolic and diastolic heart failure, COPD, HLD, tobacco dependence, presented with c/c of L foot pain, now s/p L AKA. MD assessment includes PAD, acute on chronic anemia, facial zoster (inactive), anxiety disorder, depression, and CKD.  Clinical Impression  Pt was willing to answer questions and participate in limited activities, though overall strongly refusing mobility despite encouragement and education of its importance. Opted for OT co-eval due to pt's prior limitations with therapy, pt received in bed and able to answer questions coherently. Pt refused all levels of mobility, however reported severe R eye visual deficits and nausea, as well as exhibiting some L-sided deficits with finger to nose testing and facial muscle activation. L to R UE strength grossly equal though exhibiting SOB with minimal testing - SpO2 monitored and WNL, pt denied numbness in BLEs. RN made aware of all findings. Pt was strongly educated on importance of positioning, exercise, and mobility for wound healing and prevention of contracture. At this time, PT frequency set to min 1x/week due to history of extremely limited participation. Pt will benefit from continued PT services upon discharge to safely address deficits listed in patient problem list for decreased caregiver assistance and eventual return to PLOF.        If plan is discharge home, recommend the following: A lot of help with walking and/or transfers;A lot of help with bathing/dressing/bathroom;Assistance with cooking/housework;Assist for transportation;Help with stairs or ramp for entrance   Can travel by private vehicle   No    Equipment Recommendations Other (comment) (defer to next venue of care)  Recommendations for Other  Services       Functional Status Assessment Patient has had a recent decline in their functional status and/or demonstrates limited ability to make significant improvements in function in a reasonable and predictable amount of time     Precautions / Restrictions Precautions Precautions: Fall Restrictions Weight Bearing Restrictions: No Other Position/Activity Restrictions: L AKA      Mobility  Bed Mobility Overal bed mobility: Needs Assistance             General bed mobility comments: not formally tested due to pt refusal of mobility despite copious encouragement, exhibited ability to move trunk slightly side-side with elevated HOB    Transfers                   General transfer comment: pt declined    Ambulation/Gait                  Stairs            Wheelchair Mobility     Tilt Bed    Modified Rankin (Stroke Patients Only)       Balance Overall balance assessment: Needs assistance     Sitting balance - Comments: pt declining to sit EOB                                     Pertinent Vitals/Pain Pain Assessment Pain Assessment: Faces Faces Pain Scale: Hurts a little bit Pain Location: L hip Pain Descriptors / Indicators: Discomfort, Sore Pain Intervention(s): Monitored during session, Repositioned    Home Living Family/patient expects to be discharged to:: Private residence Living Arrangements: Spouse/significant other  Available Help at Discharge: Family Type of Home: House Home Access: Stairs to enter Entrance Stairs-Rails: Right Entrance Stairs-Number of Steps: 3   Home Layout: One level Home Equipment: Agricultural consultant (2 wheels);Rollator (4 wheels);Tub bench;Wheelchair - manual;Cane - single point;Shower seat;BSC/3in1 Additional Comments: received per chart review    Prior Function Prior Level of Function : Needs assist       Physical Assist : ADLs (physical)   ADLs (physical): Bathing Mobility  Comments: previously reported Mod I household ambulator with rollator; unable to gather fall hx ADLs Comments: previously reported that husband assists with bathing and other ADLs prn, ind with dressing     Extremity/Trunk Assessment   Upper Extremity Assessment Upper Extremity Assessment: Defer to OT evaluation;Generalized weakness (grossly equal L-R)    Lower Extremity Assessment Lower Extremity Assessment: Generalized weakness;LLE deficits/detail LLE Deficits / Details: s/p AKA, notable stiffness at L hip/remaining in slight flexion       Communication   Communication Communication: Hearing impairment Cueing Techniques: Verbal cues;Tactile cues;Visual cues  Cognition Arousal: Alert Behavior During Therapy: WFL for tasks assessed/performed, Agitated, Restless Overall Cognitive Status: Within Functional Limits for tasks assessed                                          General Comments General comments (skin integrity, edema, etc.): Pt exhibiting concerning symptoms resulting in screening - L finger<->nose test slightly worse than R, mild decrease in L facial muscle activation compared to R, reports of little-no vision in R eye, some numbness in R UE though present from some time ago. Claims she had been up in chair previously for lunch    Exercises Other Exercises Other Exercises: educated pt on importance of positioning LLE and performing hip ext exercise to prevent L hip flexion contracture, educated pt on benefits of mobility for improved wound healing and decreased pain Other Exercises: performed ~3 reps AAROM L hip ext with focus on stretching hip flexors due to notable tightness, educated pt to perform often throughout the day   Assessment/Plan    PT Assessment Patient needs continued PT services  PT Problem List Decreased strength;Decreased range of motion;Decreased activity tolerance;Decreased balance;Decreased mobility;Decreased knowledge of use of  DME;Decreased coordination;Pain;Impaired sensation;Decreased skin integrity;Decreased safety awareness       PT Treatment Interventions DME instruction;Balance training;Gait training;Stair training;Functional mobility training;Therapeutic activities;Therapeutic exercise;Patient/family education;Neuromuscular re-education;Wheelchair mobility training    PT Goals (Current goals can be found in the Care Plan section)  Acute Rehab PT Goals Patient Stated Goal: decrease pain, return to PLOF PT Goal Formulation: With patient Time For Goal Achievement: 04/04/23 Potential to Achieve Goals: Fair    Frequency Min 1X/week     Co-evaluation               AM-PAC PT "6 Clicks" Mobility  Outcome Measure Help needed turning from your back to your side while in a flat bed without using bedrails?: A Little Help needed moving from lying on your back to sitting on the side of a flat bed without using bedrails?: A Lot Help needed moving to and from a bed to a chair (including a wheelchair)?: A Lot Help needed standing up from a chair using your arms (e.g., wheelchair or bedside chair)?: A Lot Help needed to walk in hospital room?: A Lot Help needed climbing 3-5 steps with a railing? : Total 6 Click Score: 12  End of Session   Activity Tolerance: Other (comment) (Pt self-limiting, refusing mobility) Patient left: in bed;with bed alarm set;with call bell/phone within reach Nurse Communication: Mobility status (pt-reported symptoms and findings of BUE/face screening) PT Visit Diagnosis: Difficulty in walking, not elsewhere classified (R26.2);Pain;Muscle weakness (generalized) (M62.81) Pain - Right/Left: Left Pain - part of body: Hip    Time: 5956-3875 PT Time Calculation (min) (ACUTE ONLY): 24 min   Charges:               Rosiland Oz SPT 03/31/23, 2:47 PM

## 2023-03-31 NOTE — Plan of Care (Signed)
  Problem: Fluid Volume: Goal: Hemodynamic stability will improve Outcome: Progressing   Problem: Clinical Measurements: Goal: Diagnostic test results will improve Outcome: Progressing Goal: Signs and symptoms of infection will decrease Outcome: Progressing   Problem: Respiratory: Goal: Ability to maintain adequate ventilation will improve Outcome: Progressing   Problem: Health Behavior/Discharge Planning: Goal: Ability to manage health-related needs will improve Outcome: Progressing   

## 2023-04-01 ENCOUNTER — Encounter: Payer: Self-pay | Admitting: Vascular Surgery

## 2023-04-01 DIAGNOSIS — I5042 Chronic combined systolic (congestive) and diastolic (congestive) heart failure: Secondary | ICD-10-CM

## 2023-04-01 DIAGNOSIS — J439 Emphysema, unspecified: Secondary | ICD-10-CM

## 2023-04-01 DIAGNOSIS — I998 Other disorder of circulatory system: Secondary | ICD-10-CM | POA: Diagnosis not present

## 2023-04-01 DIAGNOSIS — I6930 Unspecified sequelae of cerebral infarction: Secondary | ICD-10-CM | POA: Diagnosis not present

## 2023-04-01 LAB — CBC
HCT: 32 % — ABNORMAL LOW (ref 36.0–46.0)
Hemoglobin: 9.9 g/dL — ABNORMAL LOW (ref 12.0–15.0)
MCH: 23.3 pg — ABNORMAL LOW (ref 26.0–34.0)
MCHC: 30.9 g/dL (ref 30.0–36.0)
MCV: 75.3 fL — ABNORMAL LOW (ref 80.0–100.0)
Platelets: 270 10*3/uL (ref 150–400)
RBC: 4.25 MIL/uL (ref 3.87–5.11)
RDW: 26.7 % — ABNORMAL HIGH (ref 11.5–15.5)
WBC: 8.6 10*3/uL (ref 4.0–10.5)
nRBC: 0 % (ref 0.0–0.2)

## 2023-04-01 MED ORDER — LOSARTAN POTASSIUM 50 MG PO TABS
50.0000 mg | ORAL_TABLET | Freq: Every day | ORAL | Status: DC
  Administered 2023-04-01 – 2023-04-03 (×3): 50 mg via ORAL
  Filled 2023-04-01 (×3): qty 1

## 2023-04-01 NOTE — Progress Notes (Signed)
Progress Note   [name]:[id number] DOB: 08/22/1945 DOA: 03/21/2023     11 DOS: the patient was seen and examined on 04/01/2023   Brief hospital course:  Michele Matthews is a 77 y.o. female with medical history significant of hypertension, combined systolic and diastolic heart failure, COPD, hyperlipidemia, tobacco dependence who presents to the ED with chief complaint of left foot pain.  Patient states that she underwent a transmetatarsal amputation several years ago but it never quite healed properly.  2 weeks prior to presentation to [facility] noted increasing pain and a wound to the left foot.  Pain is gradually increased.  No fevers chills.  No nausea or vomiting.  Patient also endorsed a rash on the right side of her face consistent with shingles outbreak.  She denies pain or pruritus from this rash but states that she knows its there and is compelled to touch it.   On my evaluation patient is resting in bed.  She is in no distress but reports improvement after administration of pain medication.  Vital signs are stable.  Laboratory investigation significant for anemia with hemoglobin 6.4.  Patient did have mildly elevated lactic acid which improved after administration of intravenous fluids.     ED Course: Patient was given intravenous fluids, pain medication, IV antibiotics after culture data was taken.  Hospitalist contacted to admit under working diagnosis of sepsis secondary to infection cellulitis of left foot.    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  There appears to be anterior tibial runoff however this does not appear to cross the ankle. 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  Unable to assess left lower extremity due to pain.  Patient is status post left lower extremity angiography done 11/22  and percutaneous transluminal angioplasty of the right common iliac artery as well as percutaneous transluminal angioplasty and stent placement right external iliac artery Per vascular surgery "the patient has no further revascularization options with progressive limb ischemia and severe knee contracture.   --Vascular surgery managing --s/p Left AKA on 11/27 --Continue aspirin, Plavix and statin --Pain control PRN --PT/OT evaluations - SNF recommended --Lightweight manual wheelchair ordered     Left foot pain Concern for left foot cellulitis on admission Left heel ulcer Left foot x-ray shows a new lucency within the soft tissues distal and distal lateral to the fifth metatarsal stump and distal to the second metatarsal stump. No definitive new adjacent cortical erosion is seen. Re demonstration of ulceration of the distal posterior heel. No definitive cortical erosion is seen within the adjacent distal posterior calcaneus to indicate radiographic evidence of acute osteomyelitis.  Appreciate Podiatry input: "Patient has a longstanding ulcer on her left transmetatarsal amputation site that has failed to heal. At this time she has an area of necrotic tissue distal left fifth metatarsal region with ulceration to the distal medial aspect as well as a pressure ulcer of the left heel with drainage. Will await vascular evaluation but I do not believe there is very much from a limb salvage standpoint. I doubt the necrotic ulceration on the distal lateral fifth metatarsal will heal and she would  still have this large heel decubitus ulceration that will likely not heal. If they can establish better perfusion long term wound care can be performed. I do not recommend debridement of the left foot. Given her pain  status I suspect she would be better off with a below the knee or above-the-knee amputation"  Completed a 5-day course of IV Rocephin. 11/27 - pt underwent LEFT AKA with Dr. Gilda Crease    Transient Hypotension - resolved  11/27 during surgery and post-op. Briefly required Levophed, but since weaned off and MAPs are stable --Maintain MAP > 65 --Added hold parameters to antihypertensive meds  Hyponatremia - resolved Mild, likely hypovolemic while NPO for surgery. --Monitor BMP    Acute on chronic anemia History of iron deficiency anemia Hemoglobin on arrival 6.4.  Guaiac negative in ED.   No evidence of acute blood loss.  Patient not adherent to home iron therapies. Transfused total of 3 units of packed RBC (last on 11/28) --Trend Hbg and transfuse if < 7 --Monitor CBC   Facial zoster Present on right side of face.   Patient denies any visual disturbance. Rash has crusted over and is in different stages of healing --Continue Valtrex --Continue airborne and contact precautions   Chronic systolic diastolic congestive heart failure LVEF 40 to 45% Appears euvolemic at this time. --Continue carvedilol and furosemide --Continue Cozaar  Hx of CVA with residual left-sided weakness Pt's husband reports this is at baseline and appears stable.   Physical therapy and nursing expressed possibly worsened weakness.   --CT head 11/29 showed no acute infarct, chronic R MCA and L PCA infarcts --Pt refused MRI brain  --Continue to monitor for neurologic status --Continue ASA/Plavix, statin   COPD, unspecified type/severity - stable, not exacerbated Active smoker Patient started smoking at age 50.  Not ready to quit.   Smoking cessation was discussed with patient in detail --Continue nicotine patch   Essential hypertension - intermittently elevated due to pain on Cozaar and Coreg --Increase Cozaar 25 >> 50 mg daily   Generalized anxiety disorder Depression Stable Continue sertraline  and trazodone   Stage IIIa chronic kidney disease Stable Monitor renal function closely                 Subjective: Pt seen resting in bed today.  She is tearful and asks how she's going to live like this.  She talks about walking with her grandchildren and upset she cannot do that anymore.  Currently reports moderate pain.  We talked about this being a big adjustment and normal to be upset by the change.   Physical Exam: Vitals:   03/31/23 1546 03/31/23 2027 03/31/23 2109 04/01/23 0731  BP: (!) 160/88 (!) 145/68 (!) 153/83 (!) 179/82  Pulse: 92 91 94 98  Resp: 14 18 17 16   Temp: 98.3 F (36.8 C) 99.2 F (37.3 C) 98.7 F (37.1 C) 98.1 F (36.7 C)  TempSrc:   Oral Oral  SpO2: 96% 94% 94% 94%  Weight:      Height:       General exam: awake, alert, no acute distress HEENT: poor dentition, moist mucus membranes, hard of hearing  Respiratory system: on room air, normal respiratory effort. Cardiovascular system: RRR, no R pedal edema.   Gastrointestinal system: soft, NT, ND Central nervous system: A&O x 2+. no gross focal neurologic deficits, normal speech Extremities: L AKA with intact occlusive dressing, no RLE edema Skin: dry, intact, normal temperature Psychiatry: depressed mood (tearful), congruent affect, judgement and  insight appear normal       Data Reviewed: Labs reviewed and notable for ---   Hbg trend -- 6.6 (tranfused) >> 8.5 >> 9.1 >> 8.4 >> 7.3  >> 6.7 (11/28 transfused 1 unit pRBC's) >> 8.8 >> 9.1 >> 9.9   Family Communication: husband updated in hallway after rounds today  Disposition: Status is: Inpatient Remains inpatient appropriate because: Underwent AKA 11/27, d/c pending clearance by vascular surgery.  SNF recommended.   Planned Discharge Destination: SNF  vs HH     Time spent: 38 minutes  Author: Pennie Banter, DO 04/01/2023 1:46 PM  For on call review [email or web address].

## 2023-04-01 NOTE — Plan of Care (Signed)
  Problem: Skin Integrity: Goal: Risk for impaired skin integrity will decrease Outcome: Progressing   Problem: Safety: Goal: Ability to remain free from injury will improve Outcome: Progressing   Problem: Pain Management: Goal: General experience of comfort will improve Outcome: Progressing   Problem: Elimination: Goal: Will not experience complications related to bowel motility Outcome: Progressing   Problem: Elimination: Goal: Will not experience complications related to urinary retention Outcome: Progressing   Problem: Nutrition: Goal: Adequate nutrition will be maintained Outcome: Progressing

## 2023-04-02 DIAGNOSIS — I739 Peripheral vascular disease, unspecified: Secondary | ICD-10-CM | POA: Diagnosis not present

## 2023-04-02 DIAGNOSIS — I5042 Chronic combined systolic (congestive) and diastolic (congestive) heart failure: Secondary | ICD-10-CM | POA: Diagnosis not present

## 2023-04-02 DIAGNOSIS — I6930 Unspecified sequelae of cerebral infarction: Secondary | ICD-10-CM | POA: Diagnosis not present

## 2023-04-02 MED ORDER — GERHARDT'S BUTT CREAM
TOPICAL_CREAM | Freq: Two times a day (BID) | CUTANEOUS | Status: DC
  Filled 2023-04-02: qty 1

## 2023-04-02 NOTE — Progress Notes (Signed)
Progress Note   [name] QIO:[id number] DOB: 1945/09/27 DOA: 03/21/2023     12 DOS: the patient was seen and examined on 04/02/2023   Brief hospital course:  Michele Matthews is a 77 y.o. female with medical history significant of hypertension, combined systolic and diastolic heart failure, COPD, hyperlipidemia, tobacco dependence who presents to the ED with chief complaint of left foot pain.  Patient states that she underwent a transmetatarsal amputation several years ago but it never quite healed properly.  2 weeks prior to presentation to [facility] noted increasing pain and a wound to the left foot.  Pain is gradually increased.  No fevers chills.  No nausea or vomiting.  Patient also endorsed a rash on the right side of her face consistent with shingles outbreak.  She denies pain or pruritus from this rash but states that she knows its there and is compelled to touch it.   On my evaluation patient is resting in bed.  She is in no distress but reports improvement after administration of pain medication.  Vital signs are stable.  Laboratory investigation significant for anemia with hemoglobin 6.4.  Patient did have mildly elevated lactic acid which improved after administration of intravenous fluids.     ED Course: Patient was given intravenous fluids, pain medication, IV antibiotics after culture data was taken.  Hospitalist contacted to admit under working diagnosis of sepsis secondary to infection cellulitis of left foot.    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  There appears to be anterior tibial runoff however this does not appear to cross the ankle. 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  Unable to assess left lower extremity due to pain.  Patient is status post left lower extremity angiography done 11/22  and percutaneous transluminal angioplasty of the right common iliac artery as well as percutaneous transluminal angioplasty and stent placement right external iliac artery Per vascular surgery "the patient has no further revascularization options with progressive limb ischemia and severe knee contracture.   --Vascular surgery managing --s/p Left AKA on 11/27 --Continue aspirin, Plavix and statin --Pain control PRN --PT/OT evaluations - SNF recommended --Lightweight manual wheelchair ordered     Left foot pain Concern for left foot cellulitis on admission Left heel ulcer Left foot x-ray shows a new lucency within the soft tissues distal and distal lateral to the fifth metatarsal stump and distal to the second metatarsal stump. No definitive new adjacent cortical erosion is seen. Re demonstration of ulceration of the distal posterior heel. No definitive cortical erosion is seen within the adjacent distal posterior calcaneus to indicate radiographic evidence of acute osteomyelitis.  Appreciate Podiatry input: "Patient has a longstanding ulcer on her left transmetatarsal amputation site that has failed to heal. At this time she has an area of necrotic tissue distal left fifth metatarsal region with ulceration to the distal medial aspect as well as a pressure ulcer of the left heel with drainage. Will await vascular evaluation but I do not believe there is very much from a limb salvage standpoint. I doubt the necrotic ulceration on the distal lateral fifth metatarsal will heal and she would  still have this large heel decubitus ulceration that will likely not heal. If they can establish better perfusion long term wound care can be performed. I do not recommend debridement of the left foot. Given her pain  status I suspect she would be better off with a below the knee or above-the-knee amputation"  Completed a 5-day course of IV Rocephin. 11/27 - pt underwent LEFT AKA with Dr. Gilda Crease    Transient Hypotension - resolved  11/27 during surgery and post-op. Briefly required Levophed, but since weaned off and MAPs are stable --Maintain MAP > 65 --Added hold parameters to antihypertensive meds  Hyponatremia - resolved Mild, likely hypovolemic while NPO for surgery. --Monitor BMP    Acute on chronic anemia History of iron deficiency anemia Hemoglobin on arrival 6.4.  Guaiac negative in ED.   No evidence of acute blood loss.  Patient not adherent to home iron therapies. Transfused total of 3 units of packed RBC (last on 11/28) --Trend Hbg and transfuse if < 7 --Monitor CBC   Facial zoster Present on right side of face.  Improving. Patient denies any visual disturbance. Rash has crusted over and is in different stages of healing --Continue Valtrex --Continue airborne and contact precautions   Chronic systolic diastolic congestive heart failure LVEF 40 to 45% Appears euvolemic at this time. --Continue carvedilol and furosemide --Continue Cozaar  Hx of CVA with residual left-sided weakness Pt's husband reports this is at baseline and appears stable.   Physical therapy and nursing expressed possibly worsened weakness.   --CT head 11/29 showed no acute infarct, chronic R MCA and L PCA infarcts --Pt refused MRI brain  --Continue to monitor for neurologic status --Continue ASA/Plavix, statin   COPD, unspecified type/severity - stable, not exacerbated Active smoker Patient started smoking at age 40.  Not ready to quit.   Smoking cessation was discussed with patient in detail --Continue nicotine patch   Essential hypertension - intermittently elevated due to pain on Cozaar and Coreg --Increase Cozaar 25 >> 50 mg daily   Generalized anxiety disorder Depression Stable Continue  sertraline and trazodone   Stage IIIa chronic kidney disease Stable Monitor renal function closely                 Subjective: Pt seen resting in bed today.  She reports feeling okay.  She    Physical Exam: Vitals:   04/01/23 2019 04/01/23 2226 04/02/23 0842 04/02/23 0918  BP: (!) 144/77 (!) 157/73 (!) 161/84 (!) 160/78  Pulse: 88 85 86 92  Resp: 18 20 19 18   Temp: 98 F (36.7 C) 98.5 F (36.9 C) 97.6 F (36.4 C)   TempSrc: Oral     SpO2: 98% 95% 96% 95%  Weight:      Height:       General exam: awake, alert, no acute distress HEENT: image below of right upper forehead healing vesicles, poor dentition, moist mucus membranes, hard of hearing  Respiratory system: on room air, normal respiratory effort. Cardiovascular system: RRR, no R pedal edema.   Gastrointestinal system: soft, NT, ND Central nervous system: A&O x 2+. no gross focal neurologic deficits, normal speech Extremities: L AKA with intact occlusive dressing, no RLE edema Skin: dry, intact, normal temperature Psychiatry: depressed mood (tearful), congruent affect, judgement and insight appear normal  Vision -- pt able to see number of fingers I'm holding up from her right eye, with left eye covered      Data Reviewed: Labs reviewed and notable for ---  Hbg trend -- 6.6 (tranfused) >> 8.5 >> 9.1 >> 8.4 >> 7.3  >> 6.7 (11/28 transfused 1 unit pRBC's) >> 8.8 >> 9.1 >> 9.9   Family Communication: husband updated in hallway after rounds 11/30. None present today, will attempt to call as time allows  Disposition: Status is: Inpatient Remains inpatient appropriate because: Underwent AKA 11/27, d/c pending clearance by vascular surgery.  SNF recommended.   Planned Discharge Destination: SNF  vs HH     Time spent: 38 minutes  Author: Pennie Banter, DO 04/02/2023 2:35 PM  For on call review [email or web address].

## 2023-04-02 NOTE — Progress Notes (Signed)
Subjective  - POD # 4, status post left above-knee amputation  Not complaining of any pain   Physical Exam:  Dressing removed today.  Incision is clean dry and intact and healing appropriately       Assessment/Plan:  POD #4  Continue routine postoperative care. Will need PT and OT to continue to work with her  Durene Cal 04/02/2023 2:54 PM --  Vitals:   04/02/23 0842 04/02/23 0918  BP: (!) 161/84 (!) 160/78  Pulse: 86 92  Resp: 19 18  Temp: 97.6 F (36.4 C)   SpO2: 96% 95%    Intake/Output Summary (Last 24 hours) at 04/02/2023 1454 Last data filed at 04/02/2023 0755 Gross per 24 hour  Intake --  Output 500 ml  Net -500 ml     Laboratory CBC    Component Value Date/Time   WBC 8.6 04/01/2023 0357   HGB 9.9 (L) 04/01/2023 0357   HGB 12.1 03/27/2014 1730   HCT 32.0 (L) 04/01/2023 0357   HCT 37.0 03/27/2014 1730   PLT 270 04/01/2023 0357   PLT 175 03/27/2014 1730    BMET    Component Value Date/Time   NA 137 03/31/2023 0515   NA 140 03/28/2014 0441   K 3.8 03/31/2023 0515   K 4.0 03/28/2014 0441   CL 101 03/31/2023 0515   CL 110 (H) 03/28/2014 0441   CO2 27 03/31/2023 0515   CO2 24 03/28/2014 0441   GLUCOSE 78 03/31/2023 0515   GLUCOSE 86 03/28/2014 0441   BUN 25 (H) 03/31/2023 0515   BUN 19 (H) 03/28/2014 0441   CREATININE 0.77 03/31/2023 0515   CREATININE 0.79 11/24/2015 0908   CALCIUM 8.5 (L) 03/31/2023 0515   CALCIUM 8.7 03/28/2014 0441   GFRNONAA >60 03/31/2023 0515   GFRNONAA 76 11/24/2015 0908   GFRAA >60 07/28/2017 0507   GFRAA 87 11/24/2015 0908    COAG Lab Results  Component Value Date   INR 1.0 05/26/2022   INR 1.2 09/05/2021   INR 1.0 11/25/2020   No results found for: "PTT"  Antibiotics Anti-infectives (From admission, onward)    Start     Dose/Rate Route Frequency Ordered Stop   03/29/23 0600  ceFAZolin (ANCEF) IVPB 2g/100 mL premix        2 g 200 mL/hr over 30 Minutes Intravenous On call 03/29/23 0218  03/29/23 0809   03/24/23 1045  ceFAZolin (ANCEF) IVPB 2g/100 mL premix  Status:  Discontinued        2 g 200 mL/hr over 30 Minutes Intravenous 30 min pre-op 03/24/23 1045 03/24/23 1453   03/22/23 1200  vancomycin (VANCOREADY) IVPB 750 mg/150 mL  Status:  Discontinued        750 mg 150 mL/hr over 60 Minutes Intravenous Every 24 hours 03/21/23 1535 03/23/23 1147   03/21/23 2200  valACYclovir (VALTREX) tablet 500 mg        500 mg Oral 2 times daily 03/21/23 1507     03/21/23 1600  cefTRIAXone (ROCEPHIN) 2 g in sodium chloride 0.9 % 100 mL IVPB        2 g 200 mL/hr over 30 Minutes Intravenous Every 24 hours 03/21/23 1506 03/27/23 1759   03/21/23 1515  vancomycin (VANCOCIN) IVPB 1000 mg/200 mL premix  Status:  Discontinued        1,000 mg 200 mL/hr over 60 Minutes Intravenous  Once 03/21/23 1506 03/21/23 1535   03/21/23 1215  vancomycin (VANCOCIN) IVPB 1000 mg/200 mL premix  1,000 mg 200 mL/hr over 60 Minutes Intravenous  Once 03/21/23 1210 03/21/23 1321        V. Charlena Cross, M.D., [facility] [place] Office: [phone] Pager:  [phone]

## 2023-04-02 NOTE — Plan of Care (Signed)
  Problem: Fluid Volume: Goal: Hemodynamic stability will improve 04/02/2023 2345 by Milda Smart D, LPN Outcome: Progressing   Problem: Clinical Measurements: Goal: Signs and symptoms of infection will decrease 04/02/2023 2345 by Clydene Laming, LPN Outcome: Progressing   Problem: Education: Goal: Knowledge of General Education information will improve Description: Including pain rating scale, medication(s)/side effects and non-pharmacologic comfort measures 04/02/2023 2345 by Milda Smart D, LPN Outcome: Progressing   Problem: Coping: Goal: Level of anxiety will decrease 04/02/2023 2345 by Milda Smart D, LPN Outcome: Progressing   Problem: Nutrition: Goal: Adequate nutrition will be maintained 04/02/2023 2345 by Clydene Laming, LPN Outcome: Progressing

## 2023-04-02 NOTE — Plan of Care (Signed)

## 2023-04-02 NOTE — Plan of Care (Signed)
  Problem: Fluid Volume: Goal: Hemodynamic stability will improve Outcome: Progressing   Problem: Clinical Measurements: Goal: Diagnostic test results will improve Outcome: Progressing Goal: Signs and symptoms of infection will decrease Outcome: Progressing   Problem: Respiratory: Goal: Ability to maintain adequate ventilation will improve Outcome: Progressing   Problem: Education: Goal: Knowledge of General Education information will improve Description: Including pain rating scale, medication(s)/side effects and non-pharmacologic comfort measures Outcome: Progressing   Problem: Health Behavior/Discharge Planning: Goal: Ability to manage health-related needs will improve Outcome: Progressing   Problem: Clinical Measurements: Goal: Ability to maintain clinical measurements within normal limits will improve Outcome: Progressing Goal: Will remain free from infection Outcome: Progressing Goal: Diagnostic test results will improve Outcome: Progressing Goal: Respiratory complications will improve Outcome: Progressing Goal: Cardiovascular complication will be avoided Outcome: Progressing   Problem: Activity: Goal: Risk for activity intolerance will decrease Outcome: Progressing   Problem: Nutrition: Goal: Adequate nutrition will be maintained Outcome: Progressing   Problem: Coping: Goal: Level of anxiety will decrease Outcome: Progressing   Problem: Elimination: Goal: Will not experience complications related to bowel motility Outcome: Progressing Goal: Will not experience complications related to urinary retention Outcome: Progressing   Problem: Pain Management: Goal: General experience of comfort will improve Outcome: Progressing   Problem: Safety: Goal: Ability to remain free from injury will improve Outcome: Progressing   Problem: Skin Integrity: Goal: Risk for impaired skin integrity will decrease Outcome: Progressing   Problem: Education: Goal:  Understanding of CV disease, CV risk reduction, and recovery process will improve Outcome: Progressing Goal: Individualized Educational Video(s) Outcome: Progressing   Problem: Activity: Goal: Ability to return to baseline activity level will improve Outcome: Progressing   Problem: Cardiovascular: Goal: Ability to achieve and maintain adequate cardiovascular perfusion will improve Outcome: Progressing Goal: Vascular access site(s) Level 0-1 will be maintained Outcome: Progressing   Problem: Health Behavior/Discharge Planning: Goal: Ability to safely manage health-related needs after discharge will improve Outcome: Progressing

## 2023-04-03 DIAGNOSIS — I6930 Unspecified sequelae of cerebral infarction: Secondary | ICD-10-CM | POA: Diagnosis not present

## 2023-04-03 DIAGNOSIS — I5042 Chronic combined systolic (congestive) and diastolic (congestive) heart failure: Secondary | ICD-10-CM | POA: Diagnosis not present

## 2023-04-03 DIAGNOSIS — I739 Peripheral vascular disease, unspecified: Secondary | ICD-10-CM | POA: Diagnosis not present

## 2023-04-03 MED ORDER — DICLOFENAC SODIUM 1 % EX GEL: 2.0000 g | Freq: Three times a day (TID) | CUTANEOUS | Status: DC | PRN

## 2023-04-03 MED ORDER — LISINOPRIL 10 MG PO TABS: 10.0000 mg | ORAL_TABLET | Freq: Every day | ORAL | 2 refills | Status: DC

## 2023-04-03 MED ORDER — LOSARTAN POTASSIUM 50 MG PO TABS: 50.0000 mg | ORAL_TABLET | Freq: Every day | ORAL | 1 refills | Status: DC

## 2023-04-03 MED ORDER — GABAPENTIN 100 MG PO CAPS: 100.0000 mg | ORAL_CAPSULE | Freq: Three times a day (TID) | ORAL | 0 refills | Status: DC

## 2023-04-03 MED ORDER — OXYCODONE-ACETAMINOPHEN 5-325 MG PO TABS: 1.0000 | ORAL_TABLET | ORAL | 0 refills | Status: DC | PRN

## 2023-04-03 MED ORDER — POLYETHYLENE GLYCOL 3350 17 G PO PACK: 17.0000 g | PACK | Freq: Every day | ORAL | 0 refills | Status: DC

## 2023-04-03 MED ORDER — GERHARDT'S BUTT CREAM: 1.0000 | TOPICAL_CREAM | Freq: Two times a day (BID) | CUTANEOUS | Status: DC

## 2023-04-03 MED ORDER — AMLODIPINE BESYLATE 5 MG PO TABS
5.0000 mg | ORAL_TABLET | Freq: Every day | ORAL | Status: DC
  Administered 2023-04-03: 5 mg via ORAL
  Filled 2023-04-03: qty 1

## 2023-04-03 NOTE — Consult Note (Signed)
[facility] Liaison Note  04/03/2023  MARLENIE MALAS 13-Jun-1945 [id number]  Location: RN Hospital Liaison screened the patient remotely at [facility].  Insurance: [facility] H[facility]   OLESYA MELLE is a 77 y.o. female who is a Primary Care Patient of [facility]. The patient was screened for readmission hospitalization with noted medium risk score for unplanned readmission risk with 1 IP in 6 months.  The patient was assessed for potential Care Management service needs for post hospital transition for care coordination. Review of patient's electronic medical record reveals patient was admitted for Sepsis. Liaison attempted outreach to pt however unsuccessful to verified PCP. Further review in electronic medical record reveals Gavin Potters as the primary provider. This PCP office completed their TOC for members. No anticipated needs via VBCI.     [facility] Care Management/Population Health does not replace or interfere with any arrangements made by the Inpatient Transition of Care team.   For questions contact:   Elliot Cousin, RN, [facility] Liaison [place]   [facility], Population Health Office Hours MTWF  8:00 am-6:00 pm Direct Dial: [phone] mobile [phone] [Office toll free line] Office Hours are M-F 8:30 - 5 pm [email or web address]

## 2023-04-03 NOTE — Discharge Summary (Signed)
Physician Discharge Summary   [name] MRN: [id number] DOB: Jan 05, 1946  Admit date:     03/21/2023  Discharge date: {dischdate:26783}  Discharge Physician: Pennie Banter   PCP: Pcp, No   Recommendations at discharge:  {Tip this will not be part of the note when signed- Example include specific recommendations for outpatient follow-up, pending tests to follow-up on. (Optional):26781}  ***  Discharge Diagnoses: Principal Problem:   Sepsis (HCC) Active Problems:   Chronic combined systolic and diastolic heart failure (HCC)   COPD (chronic obstructive pulmonary disease) (HCC)   Coronary artery disease   Essential hypertension   PAD (peripheral artery disease) (HCC)   Depression with anxiety   History of CVA with residual deficit   GERD (gastroesophageal reflux disease)   Hyperlipidemia, unspecified   Ischemia of left lower extremity   Foot pain, left   Cellulitis  Resolved Problems:   * No resolved hospital problems. [facility] Course: No notes on file  Assessment and Plan: No notes have been filed under this hospital service. Service: Hospitalist     {Tip this will not be part of the note when signed Body mass index is 18.97 kg/m. , ,  Active Pressure Injury/Wound(s)     Pressure Ulcer  Duration          Pressure Injury 06/10/22 Sacrum Mid Stage 1 -  Intact skin with non-blanchable redness of a localized area usually over a bony prominence. red 297 days           (Optional):26781}  {(NOTE) Pain control PDMP Statment (Optional):26782} Consultants: *** Procedures performed: ***  Disposition: {Plan; Disposition:26390} Diet recommendation:  Discharge Diet Orders (From admission, onward)     Start     Ordered   04/03/23 0000  Diet - low sodium heart healthy        04/03/23 1338           {Diet_Plan:26776} DISCHARGE MEDICATION: Allergies as of 04/03/2023   No Known Allergies      Medication List     STOP taking these  medications    lisinopril 5 MG tablet Commonly known as: ZESTRIL   oxyCODONE 5 MG immediate release tablet Commonly known as: Oxy IR/ROXICODONE       TAKE these medications    acetaminophen 500 MG tablet Commonly known as: TYLENOL Take 2 tablets (1,000 mg total) by mouth 3 (three) times daily.   ascorbic acid 250 MG tablet Commonly known as: VITAMIN C Take 1 tablet (250 mg total) by mouth 2 (two) times daily.   aspirin EC 81 MG tablet Take 1 tablet (81 mg total) by mouth daily. Swallow whole.   atorvastatin 20 MG tablet Commonly known as: LIPITOR Take 20 mg by mouth daily.   busPIRone 7.5 MG tablet Commonly known as: BUSPAR Take 1 tablet (7.5 mg total) by mouth 2 (two) times daily.   carvedilol 6.25 MG tablet Commonly known as: COREG Take 0.5 tablets (3.125 mg total) by mouth 2 (two) times daily with a meal.   clopidogrel 75 MG tablet Commonly known as: PLAVIX Take 75 mg by mouth daily.   diclofenac Sodium 1 % Gel Commonly known as: VOLTAREN Apply 2 g topically 3 (three) times daily as needed (back pain).   diphenhydramine-acetaminophen 25-500 MG Tabs tablet Commonly known as: TYLENOL PM Take 1 tablet by mouth at bedtime as needed.   famotidine 20 MG tablet Commonly known as: PEPCID Take 1 tablet (20 mg total) by mouth 2 (two) times  daily as needed for heartburn or indigestion.   feeding supplement Liqd Take 237 mLs by mouth 3 (three) times daily between meals.   ferrous sulfate 325 (65 FE) MG tablet Take 1 tablet (325 mg total) by mouth 2 (two) times daily with a meal.   furosemide 20 MG tablet Commonly known as: Lasix Take 1 tablet (20 mg total) by mouth daily.   gabapentin 100 MG capsule Commonly known as: NEURONTIN Take 1 capsule (100 mg total) by mouth 3 (three) times daily.   Gerhardt's butt cream Crea Apply 1 Application topically 2 (two) times daily.   hydrOXYzine 10 MG tablet Commonly known as: ATARAX Take 1 tablet (10 mg total) by mouth  2 (two) times daily as needed for anxiety (sleep).   losartan 50 MG tablet Commonly known as: COZAAR Take 1 tablet (50 mg total) by mouth daily. Start taking on: April 04, 2023   multivitamin tablet Take 1 tablet by mouth daily.   oxyCODONE-acetaminophen 5-325 MG tablet Commonly known as: PERCOCET/ROXICET Take 1-2 tablets by mouth every 4 (four) hours as needed for moderate pain (pain score 4-6) or severe pain (pain score 7-10).   pantoprazole 20 MG tablet Commonly known as: PROTONIX Take 20 mg by mouth daily as needed for heartburn or indigestion.   polyethylene glycol 17 g packet Commonly known as: MIRALAX / GLYCOLAX Take 17 g by mouth daily. Start taking on: April 04, 2023   senna-docusate 8.6-50 MG tablet Commonly known as: Senokot-S Take 2 tablets by mouth 2 (two) times daily.   sertraline 100 MG tablet Commonly known as: ZOLOFT Take 1 tablet by mouth daily.   VISINE OP Place 1 drop into both eyes 2 (two) times daily as needed (dry eyes).               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  A cane, crutch, or walker will not resolve  issue with performing activities of daily living. A wheelchair will allow patient to safely perform daily activities. Patient is not able to propel themselves in the home using a standard weight wheelchair due to arm weakness, endurance, and general weakness. Patient can self propel in the lightweight wheelchair. Length of need Lifetime. Accessories: elevating leg rests (ELRs), wheel locks, extensions and anti-tippers.   03/31/23 1031              Discharge Care Instructions  (From admission,  onward)           Start     Ordered   04/03/23 0000  Discharge wound care:       Comments: Maintain ACE wrap dressing to left amputation site, until follow up with vascular surgery.   04/03/23 1338            Follow-up Information     Georgiana Spinner, NP Follow up in 3 week(s).   Specialty: Vascular Surgery Why: Staple reoval Contact information: [address] [place] [address] [phone]                Discharge Exam: Ceasar Mons Weights   03/21/23 1105  Weight: 51.7 kg   ***  Condition at discharge: {DC Condition:26389}  The results of significant diagnostics from this hospitalization (including imaging, microbiology, ancillary and laboratory) are listed below for reference.   Imaging Studies: CT HEAD WO CONTRAST ( )  Result Date: 03/31/2023 CLINICAL DATA:  Neuro deficit, acute, stroke suspected. EXAM: CT HEAD WITHOUT CONTRAST TECHNIQUE: Contiguous axial images were obtained from the base of the skull through the vertex without intravenous contrast. RADIATION DOSE REDUCTION: This exam was performed according to the departmental dose-optimization program which includes automated exposure control, adjustment of the mA and/or kV according to patient size and/or use of iterative reconstruction technique. COMPARISON:  Head CT 06/01/2022 FINDINGS: Brain: There is no evidence of an acute infarct, intracranial hemorrhage, mass, midline shift, or extra-axial fluid collection. Large chronic right MCA and moderate-sized chronic left PCA infarcts are again noted with ex vacuo dilatation of the lateral ventricles. There is no age advanced global cerebral atrophy. Vascular: Calcified atherosclerosis at the skull base. No hyperdense vessel. Skull: No acute fracture or suspicious osseous lesion. Sinuses/Orbits: Paranasal sinuses and mastoid air cells are clear. Unremarkable orbits. Other: None. IMPRESSION: 1. No evidence of acute intracranial abnormality. 2.  Chronic right MCA and left PCA infarcts. Electronically Signed   By: Sebastian Ache M.D.   On: 03/31/2023 15:20   [place] EKG SITE RITE  Result Date: 03/27/2023 If Site Rite image not attached, placement could not be confirmed due to current cardiac rhythm.  PERIPHERAL VASCULAR CATHETERIZATION  Result Date: 03/24/2023 See surgical note for result.  US ARTERIAL ABI (SCREENING LOWER EXTREMITY)  Result Date: 03/23/2023 CLINICAL DATA:  Left lower extremity pain Hypertension Former smoker Left toe amputation EXAM: NONINVASIVE PHYSIOLOGIC VASCULAR STUDY OF BILATERAL LOWER EXTREMITIES TECHNIQUE: Evaluation of both lower extremities were performed at rest, including calculation of ankle-brachial indices with single level pressure measurements and doppler recording. COMPARISON:  None available. FINDINGS: Right ABI:  0.43 Left ABI:  Unable to obtain due to pain Right Lower Extremity: Monophasic waveforms of the posterior tibial and dorsalis pedis arteries. Left Lower Extremity:  Unable to obtain due to pain. < 0.5 Severe PAD IMPRESSION: 1. Unable to evaluate the left lower extremity due to severe pain. 2. 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 Electronically Signed   By: Acquanetta Belling M.D.   On: 03/23/2023 08:24   DG Foot Complete Left  Result Date: 03/21/2023 CLINICAL DATA:  Status post remote transmetatarsal amputation. 2 weeks of left foot pain. Current active shingles outbreak on right side of face. EXAM: LEFT FOOT - COMPLETE 3+ VIEW COMPARISON:  Left foot radiographs 05/26/2022 FINDINGS: There is diffuse decreased bone mineralization. Postsurgical changes are again seen of transmetatarsal amputation of the first through fifth digits. There is new lucency within the soft tissues distal and distal lateral to the fifth metatarsal stump. No definitive new adjacent cortical erosion is seen. There is also more shallow lucency within the distal soft tissues closest to the second ray. No definitive  new adjacent cortical erosion is seen. Redemonstration of ulceration of the distal posterior heel. No definitive cortical erosion is seen within the adjacent distal posterior calcaneus. Small plantar and posterior calcaneal heel spurs. IMPRESSION: 1. Postsurgical changes of transmetatarsal amputation of the first through fifth digits. 2. There is new lucency within the soft tissues distal and distal lateral to the fifth metatarsal stump and distal to the second metatarsal stump. No definitive new adjacent cortical erosion is seen. 3. Redemonstration of ulceration of the distal posterior heel. No  definitive cortical erosion is seen within the adjacent distal posterior calcaneus to indicate radiographic evidence of acute osteomyelitis. Electronically Signed   By: Neita Garnet M.D.   On: 03/21/2023 16:40    Microbiology: Results for orders placed or performed during the hospital encounter of 03/21/23  Blood culture (routine x 2)     Status: None   Collection Time: 03/21/23 12:12 PM   Specimen: BLOOD  Result Value Ref Range Status   Specimen Description BLOOD RIGHT ANTECUBITAL  Final   Special Requests   Final    BOTTLES DRAWN AEROBIC AND ANAEROBIC Blood Culture adequate volume   Culture   Final    NO GROWTH 5 DAYS Performed at [facility], [address] [place], [place] [address]    Report Status 03/26/2023 FINAL  Final  Blood culture (routine x 2)     Status: None   Collection Time: 03/21/23 12:17 PM   Specimen: BLOOD  Result Value Ref Range Status   Specimen Description BLOOD BLOOD RIGHT FOREARM  Final   Special Requests   Final    BOTTLES DRAWN AEROBIC AND ANAEROBIC Blood Culture adequate volume   Culture   Final    NO GROWTH 5 DAYS Performed at [facility], [address], [place], [place] [address]    Report Status 03/26/2023 FINAL  Final  MRSA Next Gen by PCR, Nasal     Status: Abnormal   Collection Time: 03/23/23 12:45 PM   Specimen: Nasal Mucosa; Nasal Swab   Result Value Ref Range Status   MRSA by PCR Next Gen DETECTED (A) NOT DETECTED Final    Comment: RESULT CALLED TO, READ BACK BY AND VERIFIED WITH: Tye Maryland, RN 03/23/23 1418 MW (NOTE) The GeneXpert MRSA Assay (FDA approved for NASAL specimens only), is one component of a comprehensive MRSA colonization surveillance program. It is not intended to diagnose MRSA infection nor to guide or monitor treatment for MRSA infections. Test performance is not FDA approved in patients less than 64 years old. Performed at [facility], [address] [place], [place] [address]     Labs: CBC: Recent Labs  Lab 03/28/23 0544 03/29/23 0413 03/29/23 1251 03/30/23 0624 03/30/23 1300 03/31/23 0015 03/31/23 0515 04/01/23 0357  WBC 10.4 14.0*  --  11.9*  --   --  8.7 8.6  HGB 8.5* 9.1*   < > 7.3* 6.7* 8.8* 9.1* 9.9*  HCT 28.6* 30.7*   < > 25.1* 23.4* 29.3* 29.5* 32.0*  MCV 71.5* 71.9*  --  74.5*  --   --  75.8* 75.3*  PLT 261 273  --  252  --   --  240 270   < > = values in this interval not displayed.   Basic Metabolic Panel: Recent Labs  Lab 03/28/23 0544 03/29/23 0413 03/29/23 1251 03/30/23 0624 03/31/23 0515  NA 134* 132* 130* 134* 137  K 4.1 4.7 4.1 4.3 3.8  CL 97* 93* 96* 100 101  CO2 28 29 25 28 27   GLUCOSE 104* 112* 147* 103* 78  BUN 18 17 15  26* 25*  CREATININE 0.73 0.86 0.71 0.82 0.77  CALCIUM 9.3 9.7 8.8* 9.1 8.5*   Liver Function Tests: No results for input(s): "AST", "ALT", "ALKPHOS", "BILITOT", "PROT", "ALBUMIN" in the last 168 hours. CBG: Recent Labs  Lab 03/28/23 0813  GLUCAP 99    Discharge time spent: {LESS THAN/GREATER THAN:26388} 30 minutes.  Signed: Pennie Banter, DO [facility] 04/03/2023

## 2023-04-03 NOTE — Plan of Care (Signed)

## 2023-04-03 NOTE — Plan of Care (Signed)
Patient discharged per MD orders at this time.All dc instructions, education and medications reviewed with the patient and spouse.Pt expressed understanding and will comply with dc instructions.follow up appointments was also communicated to the Pt.no verbal c/o or any ssx of distress.Pt was discharged home with HH/PT/OT and nursing services per order.Pt was transported by 2 [facility] personnel on a strecther.

## 2023-04-03 NOTE — Progress Notes (Addendum)
  Progress Note    04/03/2023 12:48 PM 5 Days Post-Op  Subjective:  Michele Matthews is a 77 yo female now POD #5 from left above the knee amputation. Recovering as expected. Denies having any pain.    Vitals:   04/02/23 2300 04/03/23 0930  BP: (!) 160/80 (!) 150/76  Pulse: 92 97  Resp: 16 16  Temp: 98 F (36.7 C) 97.7 F (36.5 C)  SpO2: 98% 97%   Physical Exam: Cardiac:  RRR, normal S1, S2. No murmurs to note.  Lungs:  Non labored breathing on room air. Clear on auscultation. No rales rhonchi or wheezing to note.   Incisions:  Left extremity with staples clean dry and intact. No dressing on this morning. Nursing reports no drainage to not.  Extremities:   L AKA without occlusive dressing, no RLE edema. Warm to touch Abdomen:  soft, NT, ND Positive bowel sounds.  Neurologic: AAOX2. Did not know the day. Follows commands appropriately.   CBC    Component Value Date/Time   WBC 8.6 04/01/2023 0357   RBC 4.25 04/01/2023 0357   HGB 9.9 (L) 04/01/2023 0357   HGB 12.1 03/27/2014 1730   HCT 32.0 (L) 04/01/2023 0357   HCT 37.0 03/27/2014 1730   PLT 270 04/01/2023 0357   PLT 175 03/27/2014 1730   MCV 75.3 (L) 04/01/2023 0357   MCV 97 03/27/2014 1730   MCH 23.3 (L) 04/01/2023 0357   MCHC 30.9 04/01/2023 0357   RDW 26.7 (H) 04/01/2023 0357   RDW 13.3 03/27/2014 1730   LYMPHSABS 0.8 03/21/2023 1121   LYMPHSABS 1.9 11/25/2013 0401   MONOABS 0.7 03/21/2023 1121   MONOABS 0.7 11/25/2013 0401   EOSABS 0.1 03/21/2023 1121   EOSABS 0.2 11/25/2013 0401   BASOSABS 0.0 03/21/2023 1121   BASOSABS 0.0 11/25/2013 0401    BMET    Component Value Date/Time   NA 137 03/31/2023 0515   NA 140 03/28/2014 0441   K 3.8 03/31/2023 0515   K 4.0 03/28/2014 0441   CL 101 03/31/2023 0515   CL 110 (H) 03/28/2014 0441   CO2 27 03/31/2023 0515   CO2 24 03/28/2014 0441   GLUCOSE 78 03/31/2023 0515   GLUCOSE 86 03/28/2014 0441   BUN 25 (H) 03/31/2023 0515   BUN 19 (H) 03/28/2014 0441    CREATININE 0.77 03/31/2023 0515   CREATININE 0.79 11/24/2015 0908   CALCIUM 8.5 (L) 03/31/2023 0515   CALCIUM 8.7 03/28/2014 0441   GFRNONAA >60 03/31/2023 0515   GFRNONAA 76 11/24/2015 0908   GFRAA >60 07/28/2017 0507   GFRAA 87 11/24/2015 0908    INR    Component Value Date/Time   INR 1.0 05/26/2022 1354   INR 2.0 03/30/2014 0336     Intake/Output Summary (Last 24 hours) at 04/03/2023 1248 Last data filed at 04/03/2023 0930 Gross per 24 hour  Intake --  Output 400 ml  Net -400 ml     Assessment/Plan:  77 y.o. female is s/p left above the knee amputation.  5 Days Post-Op   PLAN: Continue asa plavix and statin.  Continue with Ace Wrap to stump to decrease edema for prosthetic placement.  Continue with PT/OT Okay to discharge when medically appropriate.  Follow up in Vascular Surgery Clinic for staple removal in 3 weeks as scheduled.   DVT prophylaxis:  ASA and Plavix    Marcie Bal [facility] 04/03/2023 12:48 PM

## 2023-04-03 NOTE — TOC Transition Note (Signed)
Transition of Care [facility]) - CM/SW Discharge Note   Patient Details  Name: Michele Matthews MRN: [id number] Date of Birth: [birth year]  [facility] of Care [facility] CM/SW Contact:  Garret Reddish, RN Phone Number: 04/03/2023, 2:45 PM   Clinical Narrative:    Chart reviewed.  Noted that patient has orders for discharge today.    Noted that patient will need Home Health on discharge.  Home health orders are for PT, OT, RN, and in home aid.  I have asked [place] with [facility] to [facility] home health referral.  Patient has no Home health preference.    I have asked John with [employer] to check on status of wheelchair for home use.    I have informed staff nurse of the above information.     Final next level of care: Home w Home Health Services Barriers to Discharge: No Barriers Identified   Patient Goals and CMS Choice CMS Medicare.gov Compare Post Acute Care list provided to:: Patient Choice offered to / list presented to : Patient  Discharge Placement                         Discharge Plan and Services Additional resources added to the After Visit Summary for                            [facility] Arranged: PT, OT [facility] Agency: [facility] Date [facility] Agency Contacted: 03/25/23   Representative spoke with at [facility] Agency: [place]  Social Determinants of Health (SDOH) Interventions SDOH Screenings   Food Insecurity: No Food Insecurity (03/21/2023)  Housing: Low Risk  (03/21/2023)  Transportation Needs: No Transportation Needs (03/21/2023)  Utilities: Not At Risk (03/21/2023)  Financial Resource Strain: Medium Risk (11/08/2021)  Tobacco Use: High Risk (03/29/2023)     Readmission Risk Interventions    06/14/2022    8:47 AM  Readmission Risk Prevention Plan  Transportation Screening Complete  Medication Review (RN Care Manager) Complete  PCP or Specialist appointment within 3-5 days of discharge Complete  HRI or Home Care Consult Complete  SW Recovery Care/Counseling  Consult Complete  Palliative Care Screening Not Applicable  Skilled Nursing Facility Not Applicable

## 2023-04-03 NOTE — Anesthesia Postprocedure Evaluation (Signed)
Anesthesia Post Note  [name]  Procedure(s) Performed: AMPUTATION ABOVE KNEE (Left: Knee)  Patient location during evaluation: PACU Anesthesia Type: General Level of consciousness: awake and alert Pain management: pain level controlled Vital Signs Assessment: post-procedure vital signs reviewed and stable Respiratory status: spontaneous breathing, nonlabored ventilation, respiratory function stable and patient connected to nasal cannula oxygen Cardiovascular status: blood pressure returned to baseline and stable Postop Assessment: no apparent nausea or vomiting Anesthetic complications: no   No notable events documented.   Last Vitals:  Vitals:   04/02/23 1601 04/02/23 2300  BP: 135/75 (!) 160/80  Pulse: 89 92  Resp: 18 16  Temp: 36.5 C 36.7 C  SpO2: 95% 98%    Last Pain:  Vitals:   04/03/23 0540  TempSrc:   PainSc: 8                  Lenard Simmer

## 2023-04-03 NOTE — Progress Notes (Signed)
Patient PICC line was removed, line intact, and a pressure dressing was applied, patient tolerated it well.EMS has taken patient to home.

## 2023-04-03 NOTE — Progress Notes (Signed)
Occupational Therapy Treatment Patient Details Name: Michele Matthews MRN: [id number] DOB: Aug 08, 1945 Today's Date: 04/03/2023   History of present illness Pt is a 77 y.o. F with medical history significant of HTN, combined systolic and diastolic heart failure, COPD, HLD, tobacco dependence, presented with c/c of L foot pain, now s/p L AKA. MD assessment includes PAD, acute on chronic anemia, facial zoster (inactive), anxiety disorder, depression, and CKD. 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   OT comments  Pt is supine in bed on arrival. Pleasant and agreeable to OT session. She reports 10/10 pain to her L hip, but was premedicated by nurse prior to OT arrival. Pain level had decreased to 5/10 upon OT exit. She required increased time and encourgament to sit at EOB, but able to perform with CGA to reach seated EOB. Mod A provided to forward scoot to get R foot on the floor. Pt attempted lateral scoot and pt unable and too afraid to let therapist help d/t  fear of falling. Min A for BLE management to return to supine. Pt reporting buttocks uncomfortable in bed, therefore able to roll to L side with CGA using rail for better positioning. Pt able to long sit in bed with SUP using bed rails for pillows behind head to be adjusted. Needed Min A and bed placed in trendelenburg position to scoot to [facility] fully. Comb left on bedside table for grooming tasks. Pt appreciative and happy with her ability to sit at EOB today. Pt returned to bed with all needs in place and will cont to require skilled acute OT services to maximize her safety and IND to return to PLOF.       If plan is discharge home, recommend the following:  Assistance with cooking/housework;Assist for transportation;Help with stairs or ramp for entrance;A lot of help with walking and/or transfers;A lot of help with bathing/dressing/bathroom;Direct supervision/assist for medications management   Equipment Recommendations  Other (comment) (defer)     Recommendations for Other Services      Precautions / Restrictions Precautions Precautions: Fall Restrictions Weight Bearing Restrictions: Yes LLE Weight Bearing: Non weight bearing       Mobility Bed Mobility Overal bed mobility: Needs Assistance Bed Mobility: Supine to Sit, Sit to Supine, Rolling Rolling: Contact guard assist, Used rails   Supine to sit: HOB elevated, Contact guard Sit to supine: Min assist, Used rails   General bed mobility comments: pt required increased time and encourgament to sit at EOB; CGA to reach seated EOB with Mod A to forward scoot; attempted lateral scoot and pt unable and too afraid to let therapist help; Min A for BLE management to return to supine; able to roll to L side with CGA using rail for better positioning    Transfers                         Balance Overall balance assessment: Needs assistance Sitting-balance support: Bilateral upper extremity supported, Feet supported Sitting balance-Leahy Scale: Fair Sitting balance - Comments: no LOB while seated at EOB during session                                   ADL either performed or assessed with clinical judgement   ADL Overall ADL's : Needs assistance/impaired  General ADL Comments: pt had already eaten lunch, declined putting sock on, placed comb on bedside table and pt likely able to perform grooming with Set up assist    Extremity/Trunk Assessment Upper Extremity Assessment Upper Extremity Assessment: Generalized weakness;LUE deficits/detail LUE Deficits / Details: limited use of LUE-reports difficuly with [facility] in bil hands for the last 9 years   Lower Extremity Assessment LLE Deficits / Details: s/p AKA, notable stiffness at L hip/remaining in slight flexion unable to achieve neutral flat positioning        Vision       Perception     Praxis      Cognition Arousal: Alert Behavior During  Therapy: WFL for tasks assessed/performed, Agitated, Restless Overall Cognitive Status: No family/caregiver present to determine baseline cognitive functioning                                 General Comments: Very HOH, difficulty describing how she was feeling        Exercises Other Exercises Other Exercises: Edu on importance of participation in therapy and trying to sit at EOB daily, as well as extended/relaxing her L hip to prevent contracture.    Shoulder Instructions       General Comments      Pertinent Vitals/ Pain       Pain Assessment Pain Assessment: Faces Faces Pain Scale: Hurts little more Pain Location: L hip Pain Descriptors / Indicators: Discomfort, Sore Pain Intervention(s): Monitored during session, Limited activity within patient's tolerance, Premedicated before session  Home Living                                          Prior Functioning/Environment              Frequency  Min 1X/week        Progress Toward Goals  OT Goals(current goals can now be found in the care plan section)  Progress towards OT goals: Progressing toward goals  Acute Rehab OT Goals Patient Stated Goal: improve strength and decrease fear of falling OT Goal Formulation: With patient Time For Goal Achievement: 04/14/23 Potential to Achieve Goals: Fair  Plan      Co-evaluation                 AM-PAC OT "6 Clicks" Daily Activity     Outcome Measure   Help from another person eating meals?: None Help from another person taking care of personal grooming?: A Little Help from another person toileting, which includes using toliet, bedpan, or urinal?: A Lot Help from another person bathing (including washing, rinsing, drying)?: A Lot Help from another person to put on and taking off regular upper body clothing?: A Little Help from another person to put on and taking off regular lower body clothing?: A Lot 6 Click Score: 16     End of Session    OT Visit Diagnosis: Unsteadiness on feet (R26.81);Other abnormalities of gait and mobility (R26.89);Muscle weakness (generalized) (M62.81);Repeated falls (R29.6)   Activity Tolerance Patient tolerated treatment well   Patient Left in bed;with call bell/phone within reach;with bed alarm set   Nurse Communication          Time: [phone] OT Time Calculation (min): 33 min  Charges: OT General Charges $OT Visit: 1 Visit OT Treatments $Therapeutic Activity: 23-37 mins  Vivien Rossetti, OTR/L  04/03/23, 3:42 PM   Constance Goltz 04/03/2023, 3:40 PM

## 2023-04-04 ENCOUNTER — Encounter: Payer: Self-pay | Admitting: Internal Medicine

## 2023-04-10 ENCOUNTER — Telehealth (INDEPENDENT_AMBULATORY_CARE_PROVIDER_SITE_OTHER): Payer: Self-pay

## 2023-04-10 DIAGNOSIS — N1831 Chronic kidney disease, stage 3a: Secondary | ICD-10-CM | POA: Diagnosis not present

## 2023-04-10 DIAGNOSIS — I69352 Hemiplegia and hemiparesis following cerebral infarction affecting left dominant side: Secondary | ICD-10-CM | POA: Diagnosis not present

## 2023-04-10 DIAGNOSIS — I70223 Atherosclerosis of native arteries of extremities with rest pain, bilateral legs: Secondary | ICD-10-CM | POA: Diagnosis not present

## 2023-04-10 DIAGNOSIS — I130 Hypertensive heart and chronic kidney disease with heart failure and stage 1 through stage 4 chronic kidney disease, or unspecified chronic kidney disease: Secondary | ICD-10-CM | POA: Diagnosis not present

## 2023-04-10 DIAGNOSIS — I5042 Chronic combined systolic (congestive) and diastolic (congestive) heart failure: Secondary | ICD-10-CM | POA: Diagnosis not present

## 2023-04-10 DIAGNOSIS — D631 Anemia in chronic kidney disease: Secondary | ICD-10-CM | POA: Diagnosis not present

## 2023-04-10 DIAGNOSIS — G546 Phantom limb syndrome with pain: Secondary | ICD-10-CM | POA: Diagnosis not present

## 2023-04-10 DIAGNOSIS — Z4781 Encounter for orthopedic aftercare following surgical amputation: Secondary | ICD-10-CM | POA: Diagnosis not present

## 2023-04-10 DIAGNOSIS — J439 Emphysema, unspecified: Secondary | ICD-10-CM | POA: Diagnosis not present

## 2023-04-10 NOTE — Telephone Encounter (Signed)
We can remove the staples on 03/20/2023 after based on her surgery date.  Yes we will sign off on orders

## 2023-04-10 NOTE — Telephone Encounter (Signed)
Michele Matthews called to make sure Dr.Schnier will sign off on all orders. Patient was discharge on 04/03/23 with Skilled nursing, Physical therapy and Occupational therapy.  She also would like to know do you want them to remove her staples.   Please advise

## 2023-04-11 DIAGNOSIS — I70223 Atherosclerosis of native arteries of extremities with rest pain, bilateral legs: Secondary | ICD-10-CM | POA: Diagnosis not present

## 2023-04-11 DIAGNOSIS — I130 Hypertensive heart and chronic kidney disease with heart failure and stage 1 through stage 4 chronic kidney disease, or unspecified chronic kidney disease: Secondary | ICD-10-CM | POA: Diagnosis not present

## 2023-04-11 DIAGNOSIS — I5042 Chronic combined systolic (congestive) and diastolic (congestive) heart failure: Secondary | ICD-10-CM | POA: Diagnosis not present

## 2023-04-11 DIAGNOSIS — D631 Anemia in chronic kidney disease: Secondary | ICD-10-CM | POA: Diagnosis not present

## 2023-04-11 DIAGNOSIS — G546 Phantom limb syndrome with pain: Secondary | ICD-10-CM | POA: Diagnosis not present

## 2023-04-11 DIAGNOSIS — N1831 Chronic kidney disease, stage 3a: Secondary | ICD-10-CM | POA: Diagnosis not present

## 2023-04-11 DIAGNOSIS — I69352 Hemiplegia and hemiparesis following cerebral infarction affecting left dominant side: Secondary | ICD-10-CM | POA: Diagnosis not present

## 2023-04-11 DIAGNOSIS — Z4781 Encounter for orthopedic aftercare following surgical amputation: Secondary | ICD-10-CM | POA: Diagnosis not present

## 2023-04-11 DIAGNOSIS — J439 Emphysema, unspecified: Secondary | ICD-10-CM | POA: Diagnosis not present

## 2023-04-11 NOTE — Telephone Encounter (Signed)
Clarified with Vivia Birmingham, the date should be 04/19/23

## 2023-04-11 NOTE — Telephone Encounter (Addendum)
Message given

## 2023-04-12 ENCOUNTER — Telehealth: Payer: Self-pay | Admitting: Family Medicine

## 2023-04-12 ENCOUNTER — Telehealth (INDEPENDENT_AMBULATORY_CARE_PROVIDER_SITE_OTHER): Payer: Self-pay

## 2023-04-12 NOTE — Telephone Encounter (Signed)
Tiffany with [facility] physical therapy requested verbal orders for visits 1 time week for 9 weeks. Tiffany was notified that orders are fine.

## 2023-04-14 ENCOUNTER — Telehealth (INDEPENDENT_AMBULATORY_CARE_PROVIDER_SITE_OTHER): Payer: Self-pay

## 2023-04-14 DIAGNOSIS — I69352 Hemiplegia and hemiparesis following cerebral infarction affecting left dominant side: Secondary | ICD-10-CM | POA: Diagnosis not present

## 2023-04-14 DIAGNOSIS — I5042 Chronic combined systolic (congestive) and diastolic (congestive) heart failure: Secondary | ICD-10-CM | POA: Diagnosis not present

## 2023-04-14 DIAGNOSIS — J439 Emphysema, unspecified: Secondary | ICD-10-CM | POA: Diagnosis not present

## 2023-04-14 DIAGNOSIS — G546 Phantom limb syndrome with pain: Secondary | ICD-10-CM | POA: Diagnosis not present

## 2023-04-14 DIAGNOSIS — D631 Anemia in chronic kidney disease: Secondary | ICD-10-CM | POA: Diagnosis not present

## 2023-04-14 DIAGNOSIS — Z4781 Encounter for orthopedic aftercare following surgical amputation: Secondary | ICD-10-CM | POA: Diagnosis not present

## 2023-04-14 DIAGNOSIS — I130 Hypertensive heart and chronic kidney disease with heart failure and stage 1 through stage 4 chronic kidney disease, or unspecified chronic kidney disease: Secondary | ICD-10-CM | POA: Diagnosis not present

## 2023-04-14 DIAGNOSIS — I70223 Atherosclerosis of native arteries of extremities with rest pain, bilateral legs: Secondary | ICD-10-CM | POA: Diagnosis not present

## 2023-04-14 DIAGNOSIS — N1831 Chronic kidney disease, stage 3a: Secondary | ICD-10-CM | POA: Diagnosis not present

## 2023-04-14 NOTE — Telephone Encounter (Signed)
Sharene with [facility] occupational therapy left a messge requesting verbal orders for 1 week 9. I spoke with [facility] and informed that orders are fine.

## 2023-04-19 ENCOUNTER — Telehealth (INDEPENDENT_AMBULATORY_CARE_PROVIDER_SITE_OTHER): Payer: Self-pay | Admitting: Nurse Practitioner

## 2023-04-19 ENCOUNTER — Encounter (INDEPENDENT_AMBULATORY_CARE_PROVIDER_SITE_OTHER): Payer: Self-pay

## 2023-04-19 DIAGNOSIS — I5042 Chronic combined systolic (congestive) and diastolic (congestive) heart failure: Secondary | ICD-10-CM | POA: Diagnosis not present

## 2023-04-19 DIAGNOSIS — N1831 Chronic kidney disease, stage 3a: Secondary | ICD-10-CM | POA: Diagnosis not present

## 2023-04-19 DIAGNOSIS — I130 Hypertensive heart and chronic kidney disease with heart failure and stage 1 through stage 4 chronic kidney disease, or unspecified chronic kidney disease: Secondary | ICD-10-CM | POA: Diagnosis not present

## 2023-04-19 DIAGNOSIS — I70223 Atherosclerosis of native arteries of extremities with rest pain, bilateral legs: Secondary | ICD-10-CM | POA: Diagnosis not present

## 2023-04-19 DIAGNOSIS — D631 Anemia in chronic kidney disease: Secondary | ICD-10-CM | POA: Diagnosis not present

## 2023-04-19 DIAGNOSIS — I69352 Hemiplegia and hemiparesis following cerebral infarction affecting left dominant side: Secondary | ICD-10-CM | POA: Diagnosis not present

## 2023-04-19 DIAGNOSIS — J439 Emphysema, unspecified: Secondary | ICD-10-CM | POA: Diagnosis not present

## 2023-04-19 DIAGNOSIS — G546 Phantom limb syndrome with pain: Secondary | ICD-10-CM | POA: Diagnosis not present

## 2023-04-19 DIAGNOSIS — Z4781 Encounter for orthopedic aftercare following surgical amputation: Secondary | ICD-10-CM | POA: Diagnosis not present

## 2023-04-19 NOTE — Telephone Encounter (Signed)
Spoke with the patient's spouse and gave him the recommendations from Sheppard Plumber NP. Per the spouse he will send pictures through Mychart.

## 2023-04-19 NOTE — Telephone Encounter (Addendum)
Patient's spouse called regarding upcoming appointment on 04/24/23 for staple removal.  Home [facility] [phone]) are currently making home visits for patient. Advised that [facility] needs to see pt before staples are removed. At this time they are unable to get her up and out of the house for any appointments. Please advise.

## 2023-04-19 NOTE — Telephone Encounter (Signed)
Would home health be agreeable to send a picture for evaluation and removing staples based on recommendations? If not, I'm not certain how to help her if she is unable to leave the home

## 2023-04-20 DIAGNOSIS — I130 Hypertensive heart and chronic kidney disease with heart failure and stage 1 through stage 4 chronic kidney disease, or unspecified chronic kidney disease: Secondary | ICD-10-CM | POA: Diagnosis not present

## 2023-04-20 DIAGNOSIS — J439 Emphysema, unspecified: Secondary | ICD-10-CM | POA: Diagnosis not present

## 2023-04-20 DIAGNOSIS — I70223 Atherosclerosis of native arteries of extremities with rest pain, bilateral legs: Secondary | ICD-10-CM | POA: Diagnosis not present

## 2023-04-20 DIAGNOSIS — Z4781 Encounter for orthopedic aftercare following surgical amputation: Secondary | ICD-10-CM | POA: Diagnosis not present

## 2023-04-20 DIAGNOSIS — N1831 Chronic kidney disease, stage 3a: Secondary | ICD-10-CM | POA: Diagnosis not present

## 2023-04-20 DIAGNOSIS — I5042 Chronic combined systolic (congestive) and diastolic (congestive) heart failure: Secondary | ICD-10-CM | POA: Diagnosis not present

## 2023-04-20 DIAGNOSIS — D631 Anemia in chronic kidney disease: Secondary | ICD-10-CM | POA: Diagnosis not present

## 2023-04-20 DIAGNOSIS — I69352 Hemiplegia and hemiparesis following cerebral infarction affecting left dominant side: Secondary | ICD-10-CM | POA: Diagnosis not present

## 2023-04-20 DIAGNOSIS — G546 Phantom limb syndrome with pain: Secondary | ICD-10-CM | POA: Diagnosis not present

## 2023-04-20 NOTE — Telephone Encounter (Signed)
They can take the staples out on Monday.  They should take every other staple out and apply steri strips.  One week later they can take out the rest.

## 2023-04-20 NOTE — Telephone Encounter (Signed)
Verbal orders were received by [place] with [facility] home [facility]

## 2023-04-24 ENCOUNTER — Ambulatory Visit (INDEPENDENT_AMBULATORY_CARE_PROVIDER_SITE_OTHER): Payer: Medicare HMO | Admitting: Nurse Practitioner

## 2023-04-24 DIAGNOSIS — I69352 Hemiplegia and hemiparesis following cerebral infarction affecting left dominant side: Secondary | ICD-10-CM | POA: Diagnosis not present

## 2023-04-24 DIAGNOSIS — I70223 Atherosclerosis of native arteries of extremities with rest pain, bilateral legs: Secondary | ICD-10-CM | POA: Diagnosis not present

## 2023-04-24 DIAGNOSIS — Z4781 Encounter for orthopedic aftercare following surgical amputation: Secondary | ICD-10-CM | POA: Diagnosis not present

## 2023-04-24 DIAGNOSIS — N1831 Chronic kidney disease, stage 3a: Secondary | ICD-10-CM | POA: Diagnosis not present

## 2023-04-24 DIAGNOSIS — I5042 Chronic combined systolic (congestive) and diastolic (congestive) heart failure: Secondary | ICD-10-CM | POA: Diagnosis not present

## 2023-04-24 DIAGNOSIS — D631 Anemia in chronic kidney disease: Secondary | ICD-10-CM | POA: Diagnosis not present

## 2023-04-24 DIAGNOSIS — J439 Emphysema, unspecified: Secondary | ICD-10-CM | POA: Diagnosis not present

## 2023-04-24 DIAGNOSIS — G546 Phantom limb syndrome with pain: Secondary | ICD-10-CM | POA: Diagnosis not present

## 2023-04-24 DIAGNOSIS — I130 Hypertensive heart and chronic kidney disease with heart failure and stage 1 through stage 4 chronic kidney disease, or unspecified chronic kidney disease: Secondary | ICD-10-CM | POA: Diagnosis not present

## 2023-04-25 ENCOUNTER — Telehealth (INDEPENDENT_AMBULATORY_CARE_PROVIDER_SITE_OTHER): Payer: Self-pay

## 2023-04-25 DIAGNOSIS — Z4781 Encounter for orthopedic aftercare following surgical amputation: Secondary | ICD-10-CM | POA: Diagnosis not present

## 2023-04-25 DIAGNOSIS — J439 Emphysema, unspecified: Secondary | ICD-10-CM | POA: Diagnosis not present

## 2023-04-25 DIAGNOSIS — D631 Anemia in chronic kidney disease: Secondary | ICD-10-CM | POA: Diagnosis not present

## 2023-04-25 DIAGNOSIS — N1831 Chronic kidney disease, stage 3a: Secondary | ICD-10-CM | POA: Diagnosis not present

## 2023-04-25 DIAGNOSIS — I130 Hypertensive heart and chronic kidney disease with heart failure and stage 1 through stage 4 chronic kidney disease, or unspecified chronic kidney disease: Secondary | ICD-10-CM | POA: Diagnosis not present

## 2023-04-25 DIAGNOSIS — I5042 Chronic combined systolic (congestive) and diastolic (congestive) heart failure: Secondary | ICD-10-CM | POA: Diagnosis not present

## 2023-04-25 DIAGNOSIS — G546 Phantom limb syndrome with pain: Secondary | ICD-10-CM | POA: Diagnosis not present

## 2023-04-25 DIAGNOSIS — I70223 Atherosclerosis of native arteries of extremities with rest pain, bilateral legs: Secondary | ICD-10-CM | POA: Diagnosis not present

## 2023-04-25 DIAGNOSIS — I69352 Hemiplegia and hemiparesis following cerebral infarction affecting left dominant side: Secondary | ICD-10-CM | POA: Diagnosis not present

## 2023-04-25 NOTE — Telephone Encounter (Signed)
That is correct 

## 2023-04-25 NOTE — Telephone Encounter (Signed)
Harvie Heck called asking for a Verbal order to remove Michele Matthews staples for her amputated leg. As seen in the notes from 12/18-12/21. I gave verbal orders to removed every other staple and replace with steri-strips. In 1 week remove all staples.

## 2023-05-01 DIAGNOSIS — D631 Anemia in chronic kidney disease: Secondary | ICD-10-CM | POA: Diagnosis not present

## 2023-05-01 DIAGNOSIS — I5042 Chronic combined systolic (congestive) and diastolic (congestive) heart failure: Secondary | ICD-10-CM | POA: Diagnosis not present

## 2023-05-01 DIAGNOSIS — G546 Phantom limb syndrome with pain: Secondary | ICD-10-CM | POA: Diagnosis not present

## 2023-05-01 DIAGNOSIS — N1831 Chronic kidney disease, stage 3a: Secondary | ICD-10-CM | POA: Diagnosis not present

## 2023-05-01 DIAGNOSIS — Z4781 Encounter for orthopedic aftercare following surgical amputation: Secondary | ICD-10-CM | POA: Diagnosis not present

## 2023-05-01 DIAGNOSIS — I70223 Atherosclerosis of native arteries of extremities with rest pain, bilateral legs: Secondary | ICD-10-CM | POA: Diagnosis not present

## 2023-05-01 DIAGNOSIS — I130 Hypertensive heart and chronic kidney disease with heart failure and stage 1 through stage 4 chronic kidney disease, or unspecified chronic kidney disease: Secondary | ICD-10-CM | POA: Diagnosis not present

## 2023-05-01 DIAGNOSIS — I69352 Hemiplegia and hemiparesis following cerebral infarction affecting left dominant side: Secondary | ICD-10-CM | POA: Diagnosis not present

## 2023-05-01 DIAGNOSIS — J439 Emphysema, unspecified: Secondary | ICD-10-CM | POA: Diagnosis not present

## 2023-05-02 DIAGNOSIS — G546 Phantom limb syndrome with pain: Secondary | ICD-10-CM | POA: Diagnosis not present

## 2023-05-02 DIAGNOSIS — I69352 Hemiplegia and hemiparesis following cerebral infarction affecting left dominant side: Secondary | ICD-10-CM | POA: Diagnosis not present

## 2023-05-02 DIAGNOSIS — J439 Emphysema, unspecified: Secondary | ICD-10-CM | POA: Diagnosis not present

## 2023-05-02 DIAGNOSIS — I130 Hypertensive heart and chronic kidney disease with heart failure and stage 1 through stage 4 chronic kidney disease, or unspecified chronic kidney disease: Secondary | ICD-10-CM | POA: Diagnosis not present

## 2023-05-02 DIAGNOSIS — I5042 Chronic combined systolic (congestive) and diastolic (congestive) heart failure: Secondary | ICD-10-CM | POA: Diagnosis not present

## 2023-05-02 DIAGNOSIS — I70223 Atherosclerosis of native arteries of extremities with rest pain, bilateral legs: Secondary | ICD-10-CM | POA: Diagnosis not present

## 2023-05-02 DIAGNOSIS — Z4781 Encounter for orthopedic aftercare following surgical amputation: Secondary | ICD-10-CM | POA: Diagnosis not present

## 2023-05-02 DIAGNOSIS — D631 Anemia in chronic kidney disease: Secondary | ICD-10-CM | POA: Diagnosis not present

## 2023-05-02 DIAGNOSIS — N1831 Chronic kidney disease, stage 3a: Secondary | ICD-10-CM | POA: Diagnosis not present

## 2023-05-09 DIAGNOSIS — D631 Anemia in chronic kidney disease: Secondary | ICD-10-CM | POA: Diagnosis not present

## 2023-05-09 DIAGNOSIS — Z4781 Encounter for orthopedic aftercare following surgical amputation: Secondary | ICD-10-CM | POA: Diagnosis not present

## 2023-05-09 DIAGNOSIS — I69352 Hemiplegia and hemiparesis following cerebral infarction affecting left dominant side: Secondary | ICD-10-CM | POA: Diagnosis not present

## 2023-05-09 DIAGNOSIS — I5042 Chronic combined systolic (congestive) and diastolic (congestive) heart failure: Secondary | ICD-10-CM | POA: Diagnosis not present

## 2023-05-09 DIAGNOSIS — G546 Phantom limb syndrome with pain: Secondary | ICD-10-CM | POA: Diagnosis not present

## 2023-05-09 DIAGNOSIS — I70223 Atherosclerosis of native arteries of extremities with rest pain, bilateral legs: Secondary | ICD-10-CM | POA: Diagnosis not present

## 2023-05-09 DIAGNOSIS — I130 Hypertensive heart and chronic kidney disease with heart failure and stage 1 through stage 4 chronic kidney disease, or unspecified chronic kidney disease: Secondary | ICD-10-CM | POA: Diagnosis not present

## 2023-05-09 DIAGNOSIS — N1831 Chronic kidney disease, stage 3a: Secondary | ICD-10-CM | POA: Diagnosis not present

## 2023-05-09 DIAGNOSIS — J439 Emphysema, unspecified: Secondary | ICD-10-CM | POA: Diagnosis not present

## 2023-05-10 DIAGNOSIS — I69352 Hemiplegia and hemiparesis following cerebral infarction affecting left dominant side: Secondary | ICD-10-CM | POA: Diagnosis not present

## 2023-05-10 DIAGNOSIS — G546 Phantom limb syndrome with pain: Secondary | ICD-10-CM | POA: Diagnosis not present

## 2023-05-10 DIAGNOSIS — I130 Hypertensive heart and chronic kidney disease with heart failure and stage 1 through stage 4 chronic kidney disease, or unspecified chronic kidney disease: Secondary | ICD-10-CM | POA: Diagnosis not present

## 2023-05-10 DIAGNOSIS — I5042 Chronic combined systolic (congestive) and diastolic (congestive) heart failure: Secondary | ICD-10-CM | POA: Diagnosis not present

## 2023-05-10 DIAGNOSIS — D631 Anemia in chronic kidney disease: Secondary | ICD-10-CM | POA: Diagnosis not present

## 2023-05-10 DIAGNOSIS — I70223 Atherosclerosis of native arteries of extremities with rest pain, bilateral legs: Secondary | ICD-10-CM | POA: Diagnosis not present

## 2023-05-10 DIAGNOSIS — J439 Emphysema, unspecified: Secondary | ICD-10-CM | POA: Diagnosis not present

## 2023-05-10 DIAGNOSIS — N1831 Chronic kidney disease, stage 3a: Secondary | ICD-10-CM | POA: Diagnosis not present

## 2023-05-10 DIAGNOSIS — Z4781 Encounter for orthopedic aftercare following surgical amputation: Secondary | ICD-10-CM | POA: Diagnosis not present

## 2023-05-11 DIAGNOSIS — Z4781 Encounter for orthopedic aftercare following surgical amputation: Secondary | ICD-10-CM | POA: Diagnosis not present

## 2023-05-11 DIAGNOSIS — G546 Phantom limb syndrome with pain: Secondary | ICD-10-CM | POA: Diagnosis not present

## 2023-05-11 DIAGNOSIS — N1831 Chronic kidney disease, stage 3a: Secondary | ICD-10-CM | POA: Diagnosis not present

## 2023-05-11 DIAGNOSIS — I130 Hypertensive heart and chronic kidney disease with heart failure and stage 1 through stage 4 chronic kidney disease, or unspecified chronic kidney disease: Secondary | ICD-10-CM | POA: Diagnosis not present

## 2023-05-11 DIAGNOSIS — I70223 Atherosclerosis of native arteries of extremities with rest pain, bilateral legs: Secondary | ICD-10-CM | POA: Diagnosis not present

## 2023-05-11 DIAGNOSIS — I69352 Hemiplegia and hemiparesis following cerebral infarction affecting left dominant side: Secondary | ICD-10-CM | POA: Diagnosis not present

## 2023-05-11 DIAGNOSIS — J439 Emphysema, unspecified: Secondary | ICD-10-CM | POA: Diagnosis not present

## 2023-05-11 DIAGNOSIS — D631 Anemia in chronic kidney disease: Secondary | ICD-10-CM | POA: Diagnosis not present

## 2023-05-11 DIAGNOSIS — I5042 Chronic combined systolic (congestive) and diastolic (congestive) heart failure: Secondary | ICD-10-CM | POA: Diagnosis not present

## 2023-05-16 DIAGNOSIS — I70223 Atherosclerosis of native arteries of extremities with rest pain, bilateral legs: Secondary | ICD-10-CM | POA: Diagnosis not present

## 2023-05-16 DIAGNOSIS — I130 Hypertensive heart and chronic kidney disease with heart failure and stage 1 through stage 4 chronic kidney disease, or unspecified chronic kidney disease: Secondary | ICD-10-CM | POA: Diagnosis not present

## 2023-05-16 DIAGNOSIS — I5042 Chronic combined systolic (congestive) and diastolic (congestive) heart failure: Secondary | ICD-10-CM | POA: Diagnosis not present

## 2023-05-16 DIAGNOSIS — Z4781 Encounter for orthopedic aftercare following surgical amputation: Secondary | ICD-10-CM | POA: Diagnosis not present

## 2023-05-16 DIAGNOSIS — G546 Phantom limb syndrome with pain: Secondary | ICD-10-CM | POA: Diagnosis not present

## 2023-05-16 DIAGNOSIS — N1831 Chronic kidney disease, stage 3a: Secondary | ICD-10-CM | POA: Diagnosis not present

## 2023-05-16 DIAGNOSIS — J439 Emphysema, unspecified: Secondary | ICD-10-CM | POA: Diagnosis not present

## 2023-05-16 DIAGNOSIS — D631 Anemia in chronic kidney disease: Secondary | ICD-10-CM | POA: Diagnosis not present

## 2023-05-16 DIAGNOSIS — I69352 Hemiplegia and hemiparesis following cerebral infarction affecting left dominant side: Secondary | ICD-10-CM | POA: Diagnosis not present

## 2023-05-17 ENCOUNTER — Ambulatory Visit: Payer: Medicare HMO | Admitting: Family Medicine

## 2023-05-23 DIAGNOSIS — I130 Hypertensive heart and chronic kidney disease with heart failure and stage 1 through stage 4 chronic kidney disease, or unspecified chronic kidney disease: Secondary | ICD-10-CM | POA: Diagnosis not present

## 2023-05-23 DIAGNOSIS — J439 Emphysema, unspecified: Secondary | ICD-10-CM | POA: Diagnosis not present

## 2023-05-23 DIAGNOSIS — I69352 Hemiplegia and hemiparesis following cerebral infarction affecting left dominant side: Secondary | ICD-10-CM | POA: Diagnosis not present

## 2023-05-23 DIAGNOSIS — D631 Anemia in chronic kidney disease: Secondary | ICD-10-CM | POA: Diagnosis not present

## 2023-05-23 DIAGNOSIS — N1831 Chronic kidney disease, stage 3a: Secondary | ICD-10-CM | POA: Diagnosis not present

## 2023-05-23 DIAGNOSIS — I70223 Atherosclerosis of native arteries of extremities with rest pain, bilateral legs: Secondary | ICD-10-CM | POA: Diagnosis not present

## 2023-05-23 DIAGNOSIS — I5042 Chronic combined systolic (congestive) and diastolic (congestive) heart failure: Secondary | ICD-10-CM | POA: Diagnosis not present

## 2023-05-23 DIAGNOSIS — G546 Phantom limb syndrome with pain: Secondary | ICD-10-CM | POA: Diagnosis not present

## 2023-05-23 DIAGNOSIS — Z4781 Encounter for orthopedic aftercare following surgical amputation: Secondary | ICD-10-CM | POA: Diagnosis not present

## 2023-05-24 DIAGNOSIS — J439 Emphysema, unspecified: Secondary | ICD-10-CM | POA: Diagnosis not present

## 2023-05-24 DIAGNOSIS — Z4781 Encounter for orthopedic aftercare following surgical amputation: Secondary | ICD-10-CM | POA: Diagnosis not present

## 2023-05-24 DIAGNOSIS — I70223 Atherosclerosis of native arteries of extremities with rest pain, bilateral legs: Secondary | ICD-10-CM | POA: Diagnosis not present

## 2023-05-24 DIAGNOSIS — I5042 Chronic combined systolic (congestive) and diastolic (congestive) heart failure: Secondary | ICD-10-CM | POA: Diagnosis not present

## 2023-05-24 DIAGNOSIS — I130 Hypertensive heart and chronic kidney disease with heart failure and stage 1 through stage 4 chronic kidney disease, or unspecified chronic kidney disease: Secondary | ICD-10-CM | POA: Diagnosis not present

## 2023-05-24 DIAGNOSIS — G546 Phantom limb syndrome with pain: Secondary | ICD-10-CM | POA: Diagnosis not present

## 2023-05-24 DIAGNOSIS — N1831 Chronic kidney disease, stage 3a: Secondary | ICD-10-CM | POA: Diagnosis not present

## 2023-05-24 DIAGNOSIS — I69352 Hemiplegia and hemiparesis following cerebral infarction affecting left dominant side: Secondary | ICD-10-CM | POA: Diagnosis not present

## 2023-05-24 DIAGNOSIS — D631 Anemia in chronic kidney disease: Secondary | ICD-10-CM | POA: Diagnosis not present

## 2023-05-25 DIAGNOSIS — G546 Phantom limb syndrome with pain: Secondary | ICD-10-CM | POA: Diagnosis not present

## 2023-05-25 DIAGNOSIS — N1831 Chronic kidney disease, stage 3a: Secondary | ICD-10-CM | POA: Diagnosis not present

## 2023-05-25 DIAGNOSIS — J439 Emphysema, unspecified: Secondary | ICD-10-CM | POA: Diagnosis not present

## 2023-05-25 DIAGNOSIS — I69352 Hemiplegia and hemiparesis following cerebral infarction affecting left dominant side: Secondary | ICD-10-CM | POA: Diagnosis not present

## 2023-05-25 DIAGNOSIS — D631 Anemia in chronic kidney disease: Secondary | ICD-10-CM | POA: Diagnosis not present

## 2023-05-25 DIAGNOSIS — Z4781 Encounter for orthopedic aftercare following surgical amputation: Secondary | ICD-10-CM | POA: Diagnosis not present

## 2023-05-25 DIAGNOSIS — I70223 Atherosclerosis of native arteries of extremities with rest pain, bilateral legs: Secondary | ICD-10-CM | POA: Diagnosis not present

## 2023-05-25 DIAGNOSIS — I130 Hypertensive heart and chronic kidney disease with heart failure and stage 1 through stage 4 chronic kidney disease, or unspecified chronic kidney disease: Secondary | ICD-10-CM | POA: Diagnosis not present

## 2023-05-25 DIAGNOSIS — I5042 Chronic combined systolic (congestive) and diastolic (congestive) heart failure: Secondary | ICD-10-CM | POA: Diagnosis not present

## 2023-05-31 ENCOUNTER — Ambulatory Visit: Payer: Medicare HMO | Admitting: Family Medicine

## 2023-06-01 DIAGNOSIS — Z4781 Encounter for orthopedic aftercare following surgical amputation: Secondary | ICD-10-CM | POA: Diagnosis not present

## 2023-06-01 DIAGNOSIS — G546 Phantom limb syndrome with pain: Secondary | ICD-10-CM | POA: Diagnosis not present

## 2023-06-01 DIAGNOSIS — D631 Anemia in chronic kidney disease: Secondary | ICD-10-CM | POA: Diagnosis not present

## 2023-06-01 DIAGNOSIS — I130 Hypertensive heart and chronic kidney disease with heart failure and stage 1 through stage 4 chronic kidney disease, or unspecified chronic kidney disease: Secondary | ICD-10-CM | POA: Diagnosis not present

## 2023-06-01 DIAGNOSIS — I69352 Hemiplegia and hemiparesis following cerebral infarction affecting left dominant side: Secondary | ICD-10-CM | POA: Diagnosis not present

## 2023-06-01 DIAGNOSIS — N1831 Chronic kidney disease, stage 3a: Secondary | ICD-10-CM | POA: Diagnosis not present

## 2023-06-01 DIAGNOSIS — I70223 Atherosclerosis of native arteries of extremities with rest pain, bilateral legs: Secondary | ICD-10-CM | POA: Diagnosis not present

## 2023-06-01 DIAGNOSIS — J439 Emphysema, unspecified: Secondary | ICD-10-CM | POA: Diagnosis not present

## 2023-06-01 DIAGNOSIS — I5042 Chronic combined systolic (congestive) and diastolic (congestive) heart failure: Secondary | ICD-10-CM | POA: Diagnosis not present

## 2023-06-06 DIAGNOSIS — D631 Anemia in chronic kidney disease: Secondary | ICD-10-CM | POA: Diagnosis not present

## 2023-06-06 DIAGNOSIS — J439 Emphysema, unspecified: Secondary | ICD-10-CM | POA: Diagnosis not present

## 2023-06-06 DIAGNOSIS — G546 Phantom limb syndrome with pain: Secondary | ICD-10-CM | POA: Diagnosis not present

## 2023-06-06 DIAGNOSIS — I5042 Chronic combined systolic (congestive) and diastolic (congestive) heart failure: Secondary | ICD-10-CM | POA: Diagnosis not present

## 2023-06-06 DIAGNOSIS — Z4781 Encounter for orthopedic aftercare following surgical amputation: Secondary | ICD-10-CM | POA: Diagnosis not present

## 2023-06-06 DIAGNOSIS — I70223 Atherosclerosis of native arteries of extremities with rest pain, bilateral legs: Secondary | ICD-10-CM | POA: Diagnosis not present

## 2023-06-06 DIAGNOSIS — I130 Hypertensive heart and chronic kidney disease with heart failure and stage 1 through stage 4 chronic kidney disease, or unspecified chronic kidney disease: Secondary | ICD-10-CM | POA: Diagnosis not present

## 2023-06-06 DIAGNOSIS — I69352 Hemiplegia and hemiparesis following cerebral infarction affecting left dominant side: Secondary | ICD-10-CM | POA: Diagnosis not present

## 2023-06-06 DIAGNOSIS — N1831 Chronic kidney disease, stage 3a: Secondary | ICD-10-CM | POA: Diagnosis not present

## 2023-06-07 ENCOUNTER — Ambulatory Visit: Payer: Self-pay | Admitting: Family Medicine

## 2023-06-07 DIAGNOSIS — G546 Phantom limb syndrome with pain: Secondary | ICD-10-CM | POA: Diagnosis not present

## 2023-06-07 DIAGNOSIS — J439 Emphysema, unspecified: Secondary | ICD-10-CM | POA: Diagnosis not present

## 2023-06-07 DIAGNOSIS — Z4781 Encounter for orthopedic aftercare following surgical amputation: Secondary | ICD-10-CM | POA: Diagnosis not present

## 2023-06-07 DIAGNOSIS — I5042 Chronic combined systolic (congestive) and diastolic (congestive) heart failure: Secondary | ICD-10-CM | POA: Diagnosis not present

## 2023-06-07 DIAGNOSIS — I130 Hypertensive heart and chronic kidney disease with heart failure and stage 1 through stage 4 chronic kidney disease, or unspecified chronic kidney disease: Secondary | ICD-10-CM | POA: Diagnosis not present

## 2023-06-07 DIAGNOSIS — I70223 Atherosclerosis of native arteries of extremities with rest pain, bilateral legs: Secondary | ICD-10-CM | POA: Diagnosis not present

## 2023-06-07 DIAGNOSIS — I69352 Hemiplegia and hemiparesis following cerebral infarction affecting left dominant side: Secondary | ICD-10-CM | POA: Diagnosis not present

## 2023-06-07 DIAGNOSIS — N1831 Chronic kidney disease, stage 3a: Secondary | ICD-10-CM | POA: Diagnosis not present

## 2023-06-07 DIAGNOSIS — D631 Anemia in chronic kidney disease: Secondary | ICD-10-CM | POA: Diagnosis not present

## 2023-06-09 DIAGNOSIS — I5042 Chronic combined systolic (congestive) and diastolic (congestive) heart failure: Secondary | ICD-10-CM | POA: Diagnosis not present

## 2023-06-09 DIAGNOSIS — N1831 Chronic kidney disease, stage 3a: Secondary | ICD-10-CM | POA: Diagnosis not present

## 2023-06-09 DIAGNOSIS — D631 Anemia in chronic kidney disease: Secondary | ICD-10-CM | POA: Diagnosis not present

## 2023-06-09 DIAGNOSIS — I70223 Atherosclerosis of native arteries of extremities with rest pain, bilateral legs: Secondary | ICD-10-CM | POA: Diagnosis not present

## 2023-06-09 DIAGNOSIS — I130 Hypertensive heart and chronic kidney disease with heart failure and stage 1 through stage 4 chronic kidney disease, or unspecified chronic kidney disease: Secondary | ICD-10-CM | POA: Diagnosis not present

## 2023-06-09 DIAGNOSIS — Z4781 Encounter for orthopedic aftercare following surgical amputation: Secondary | ICD-10-CM | POA: Diagnosis not present

## 2023-06-09 DIAGNOSIS — G546 Phantom limb syndrome with pain: Secondary | ICD-10-CM | POA: Diagnosis not present

## 2023-06-09 DIAGNOSIS — I69352 Hemiplegia and hemiparesis following cerebral infarction affecting left dominant side: Secondary | ICD-10-CM | POA: Diagnosis not present

## 2023-06-09 DIAGNOSIS — J439 Emphysema, unspecified: Secondary | ICD-10-CM | POA: Diagnosis not present

## 2023-06-13 DIAGNOSIS — J439 Emphysema, unspecified: Secondary | ICD-10-CM | POA: Diagnosis not present

## 2023-06-13 DIAGNOSIS — I70223 Atherosclerosis of native arteries of extremities with rest pain, bilateral legs: Secondary | ICD-10-CM | POA: Diagnosis not present

## 2023-06-13 DIAGNOSIS — I69352 Hemiplegia and hemiparesis following cerebral infarction affecting left dominant side: Secondary | ICD-10-CM | POA: Diagnosis not present

## 2023-06-13 DIAGNOSIS — Z4781 Encounter for orthopedic aftercare following surgical amputation: Secondary | ICD-10-CM | POA: Diagnosis not present

## 2023-06-13 DIAGNOSIS — N1831 Chronic kidney disease, stage 3a: Secondary | ICD-10-CM | POA: Diagnosis not present

## 2023-06-13 DIAGNOSIS — D631 Anemia in chronic kidney disease: Secondary | ICD-10-CM | POA: Diagnosis not present

## 2023-06-13 DIAGNOSIS — I130 Hypertensive heart and chronic kidney disease with heart failure and stage 1 through stage 4 chronic kidney disease, or unspecified chronic kidney disease: Secondary | ICD-10-CM | POA: Diagnosis not present

## 2023-06-13 DIAGNOSIS — G546 Phantom limb syndrome with pain: Secondary | ICD-10-CM | POA: Diagnosis not present

## 2023-06-13 DIAGNOSIS — I5042 Chronic combined systolic (congestive) and diastolic (congestive) heart failure: Secondary | ICD-10-CM | POA: Diagnosis not present

## 2023-06-15 DIAGNOSIS — I69352 Hemiplegia and hemiparesis following cerebral infarction affecting left dominant side: Secondary | ICD-10-CM | POA: Diagnosis not present

## 2023-06-15 DIAGNOSIS — J439 Emphysema, unspecified: Secondary | ICD-10-CM | POA: Diagnosis not present

## 2023-06-15 DIAGNOSIS — I70223 Atherosclerosis of native arteries of extremities with rest pain, bilateral legs: Secondary | ICD-10-CM | POA: Diagnosis not present

## 2023-06-15 DIAGNOSIS — I130 Hypertensive heart and chronic kidney disease with heart failure and stage 1 through stage 4 chronic kidney disease, or unspecified chronic kidney disease: Secondary | ICD-10-CM | POA: Diagnosis not present

## 2023-06-15 DIAGNOSIS — G546 Phantom limb syndrome with pain: Secondary | ICD-10-CM | POA: Diagnosis not present

## 2023-06-15 DIAGNOSIS — N1831 Chronic kidney disease, stage 3a: Secondary | ICD-10-CM | POA: Diagnosis not present

## 2023-06-15 DIAGNOSIS — D631 Anemia in chronic kidney disease: Secondary | ICD-10-CM | POA: Diagnosis not present

## 2023-06-15 DIAGNOSIS — Z4781 Encounter for orthopedic aftercare following surgical amputation: Secondary | ICD-10-CM | POA: Diagnosis not present

## 2023-06-15 DIAGNOSIS — I5042 Chronic combined systolic (congestive) and diastolic (congestive) heart failure: Secondary | ICD-10-CM | POA: Diagnosis not present

## 2023-06-16 DIAGNOSIS — D631 Anemia in chronic kidney disease: Secondary | ICD-10-CM | POA: Diagnosis not present

## 2023-06-16 DIAGNOSIS — I5042 Chronic combined systolic (congestive) and diastolic (congestive) heart failure: Secondary | ICD-10-CM | POA: Diagnosis not present

## 2023-06-16 DIAGNOSIS — I70223 Atherosclerosis of native arteries of extremities with rest pain, bilateral legs: Secondary | ICD-10-CM | POA: Diagnosis not present

## 2023-06-16 DIAGNOSIS — I69352 Hemiplegia and hemiparesis following cerebral infarction affecting left dominant side: Secondary | ICD-10-CM | POA: Diagnosis not present

## 2023-06-16 DIAGNOSIS — Z4781 Encounter for orthopedic aftercare following surgical amputation: Secondary | ICD-10-CM | POA: Diagnosis not present

## 2023-06-16 DIAGNOSIS — J439 Emphysema, unspecified: Secondary | ICD-10-CM | POA: Diagnosis not present

## 2023-06-16 DIAGNOSIS — G546 Phantom limb syndrome with pain: Secondary | ICD-10-CM | POA: Diagnosis not present

## 2023-06-16 DIAGNOSIS — I130 Hypertensive heart and chronic kidney disease with heart failure and stage 1 through stage 4 chronic kidney disease, or unspecified chronic kidney disease: Secondary | ICD-10-CM | POA: Diagnosis not present

## 2023-06-16 DIAGNOSIS — N1831 Chronic kidney disease, stage 3a: Secondary | ICD-10-CM | POA: Diagnosis not present

## 2023-06-26 DIAGNOSIS — I5042 Chronic combined systolic (congestive) and diastolic (congestive) heart failure: Secondary | ICD-10-CM | POA: Diagnosis not present

## 2023-06-26 DIAGNOSIS — J439 Emphysema, unspecified: Secondary | ICD-10-CM | POA: Diagnosis not present

## 2023-06-26 DIAGNOSIS — G546 Phantom limb syndrome with pain: Secondary | ICD-10-CM | POA: Diagnosis not present

## 2023-06-26 DIAGNOSIS — I130 Hypertensive heart and chronic kidney disease with heart failure and stage 1 through stage 4 chronic kidney disease, or unspecified chronic kidney disease: Secondary | ICD-10-CM | POA: Diagnosis not present

## 2023-06-26 DIAGNOSIS — Z4781 Encounter for orthopedic aftercare following surgical amputation: Secondary | ICD-10-CM | POA: Diagnosis not present

## 2023-06-26 DIAGNOSIS — D631 Anemia in chronic kidney disease: Secondary | ICD-10-CM | POA: Diagnosis not present

## 2023-06-26 DIAGNOSIS — I69352 Hemiplegia and hemiparesis following cerebral infarction affecting left dominant side: Secondary | ICD-10-CM | POA: Diagnosis not present

## 2023-06-26 DIAGNOSIS — I70223 Atherosclerosis of native arteries of extremities with rest pain, bilateral legs: Secondary | ICD-10-CM | POA: Diagnosis not present

## 2023-06-26 DIAGNOSIS — N1831 Chronic kidney disease, stage 3a: Secondary | ICD-10-CM | POA: Diagnosis not present

## 2023-07-12 ENCOUNTER — Ambulatory Visit: Payer: Medicare HMO | Admitting: Family Medicine

## 2023-07-12 ENCOUNTER — Telehealth: Admitting: Physician Assistant

## 2023-07-12 DIAGNOSIS — Z760 Encounter for issue of repeat prescription: Secondary | ICD-10-CM

## 2023-07-12 MED ORDER — SERTRALINE HCL 100 MG PO TABS
100.0000 mg | ORAL_TABLET | Freq: Every day | ORAL | 0 refills | Status: AC
Start: 2023-07-12 — End: ?

## 2023-07-12 MED ORDER — GABAPENTIN 100 MG PO CAPS: 100.0000 mg | ORAL_CAPSULE | Freq: Three times a day (TID) | ORAL | 0 refills | Status: AC

## 2023-07-12 MED ORDER — FUROSEMIDE 20 MG PO TABS: 20.0000 mg | ORAL_TABLET | Freq: Every day | ORAL | 0 refills | Status: AC

## 2023-07-12 MED ORDER — CARVEDILOL 6.25 MG PO TABS: 3.1250 mg | ORAL_TABLET | Freq: Two times a day (BID) | ORAL | 0 refills | Status: AC

## 2023-07-12 MED ORDER — ATORVASTATIN CALCIUM 20 MG PO TABS
20.0000 mg | ORAL_TABLET | Freq: Every day | ORAL | 0 refills | Status: AC
Start: 2023-07-12 — End: ?

## 2023-07-12 NOTE — Progress Notes (Signed)
 Virtual Visit Consent   Michele Matthews, you are scheduled for a virtual visit with a [facility] provider today. Just as with appointments in the office, your consent must be obtained to participate. Your consent will be active for this visit and any virtual visit you may have with one of our providers in the next 365 days. If you have a MyChart account, a copy of this consent can be sent to you electronically.  As this is a virtual visit, video technology does not allow for your provider to perform a traditional examination. This may limit your provider's ability to fully assess your condition. If your provider identifies any concerns that need to be evaluated in person or the need to arrange testing (such as labs, EKG, etc.), we will make arrangements to do so. Although advances in technology are sophisticated, we cannot ensure that it will always work on either your end or our end. If the connection with a video visit is poor, the visit may have to be switched to a telephone visit. With either a video or telephone visit, we are not always able to ensure that we have a secure connection.  By engaging in this virtual visit, you consent to the provision of healthcare and authorize for your insurance to be billed (if applicable) for the services provided during this visit. Depending on your insurance coverage, you may receive a charge related to this service.  I need to obtain your verbal consent now. Are you willing to proceed with your visit today? Michele Matthews has provided verbal consent on 07/12/2023 for a virtual visit (video or telephone). Michele Matthews, [place]  Date: 07/12/2023 11:08 AM   Virtual Visit via Video Note   I, Michele Matthews, connected with  Michele Matthews  ([id number], 11-12-1945) on 07/12/23 at 11:00 AM EDT by a video-enabled telemedicine application and verified that I am speaking with the correct person using two identifiers.  Location: Patient: Virtual Visit Location  Patient: Home Provider: Virtual Visit Location Provider: Home Office   I discussed the limitations of evaluation and management by telemedicine and the availability of in person appointments. The patient expressed understanding and agreed to proceed.    History of Present Illness: Michele Matthews is a 78 y.o. who identifies as a transgender female who was assigned adult at birth, and is being seen today for medication refill. Is currently without a PCP, just followed by Orthopedic Surgery s/p recent amputation of left leg to above the knee. Notes doing well for that but has not been able to get her in for scheduled PCP appt to establish due to lack of ramp to give him access to get her into the car and to the office. Is requesting one-time refill of her Sertraline, Atorvastatin, Carvedilol, Furosemide, Gabapentin. Has plenty of her other medications at present time.   HPI: HPI  Problems:  Patient Active Problem List   Diagnosis Date Noted   Foot pain, left 03/22/2023   Hemiparesis affecting left side as late effect of cerebrovascular accident (CVA) (HCC) 05/29/2022   PAD (peripheral artery disease) (HCC) 05/26/2022   HLD (hyperlipidemia) 05/26/2022   Depression with anxiety 05/26/2022   Leg edema, right 05/26/2022   UTI (urinary tract infection) 09/05/2021   Chronic combined systolic and diastolic heart failure (HCC) 09/05/2021   Hypokalemia 11/28/2020   Hyponatremia 11/28/2020   Left foot infection 11/25/2020   Chronic combined systolic and diastolic CHF (congestive heart failure) (HCC) 07/24/2017   Severe  protein-calorie malnutrition (HCC) 04/01/2016   Anemia, iron deficiency    Pressure ulcer 12/27/2015   Essential hypertension 12/27/2015   Anemia 10/26/2015   COPD (chronic obstructive pulmonary disease) (HCC) 06/19/2015   Coronary artery disease 06/19/2015   GERD (gastroesophageal reflux disease) 06/19/2015   Hyperlipidemia, unspecified 06/19/2015   Long term current use of  anticoagulant therapy 06/19/2015   Anxiety and depression 06/19/2015   Symptomatic anemia 12/10/2014   Upper GI bleed    History of CVA with residual deficit 12/10/2013   Hemianopsia 12/10/2013   Left-sided weakness 12/10/2013   Tobacco abuse 12/10/2013    Allergies: No Known Allergies Medications:  Current Outpatient Medications:    lisinopril (ZESTRIL) 10 MG tablet, Take 10 mg by mouth daily., Disp: , Rfl:    acetaminophen (TYLENOL) 500 MG tablet, Take 2 tablets (1,000 mg total) by mouth 3 (three) times daily., Disp: 30 tablet, Rfl: 0   ascorbic acid (VITAMIN C) 250 MG tablet, Take 1 tablet (250 mg total) by mouth 2 (two) times daily. (Patient not taking: Reported on 05/26/2022), Disp: , Rfl:    atorvastatin (LIPITOR) 20 MG tablet, Take 1 tablet (20 mg total) by mouth daily., Disp: 90 tablet, Rfl: 0   busPIRone (BUSPAR) 7.5 MG tablet, Take 1 tablet (7.5 mg total) by mouth 2 (two) times daily., Disp: 180 tablet, Rfl: 3   carvedilol (COREG) 6.25 MG tablet, Take 0.5 tablets (3.125 mg total) by mouth 2 (two) times daily with a meal., Disp: 90 tablet, Rfl: 0   clopidogrel (PLAVIX) 75 MG tablet, Take 75 mg by mouth daily., Disp: , Rfl:    furosemide (LASIX) 20 MG tablet, Take 1 tablet (20 mg total) by mouth daily., Disp: 90 tablet, Rfl: 0   gabapentin (NEURONTIN) 100 MG capsule, Take 1 capsule (100 mg total) by mouth 3 (three) times daily., Disp: 90 capsule, Rfl: 0   hydrOXYzine (ATARAX/VISTARIL) 10 MG tablet, Take 1 tablet (10 mg total) by mouth 2 (two) times daily as needed for anxiety (sleep)., Disp: 60 tablet, Rfl: 11   Multiple Vitamin (MULTIVITAMIN) tablet, Take 1 tablet by mouth daily., Disp: , Rfl:    pantoprazole (PROTONIX) 20 MG tablet, Take 20 mg by mouth daily as needed for heartburn or indigestion., Disp: , Rfl:    sertraline (ZOLOFT) 100 MG tablet, Take 1 tablet (100 mg total) by mouth daily., Disp: 90 tablet, Rfl: 0   Tetrahydrozoline HCl (VISINE OP), Place 1 drop into both eyes 2  (two) times daily as needed (dry eyes)., Disp: , Rfl:   Observations/Objective: Patient is well-developed, well-nourished in no acute distress.  Resting comfortably  at home.  Head is normocephalic, atraumatic.  No labored breathing. Speech is clear and coherent with logical content.  Patient is alert and oriented at baseline.   Assessment and Plan: 1. Encounter for medication refill (Primary) - atorvastatin (LIPITOR) 20 MG tablet; Take 1 tablet (20 mg total) by mouth daily.  Dispense: 90 tablet; Refill: 0 - carvedilol (COREG) 6.25 MG tablet; Take 0.5 tablets (3.125 mg total) by mouth 2 (two) times daily with a meal.  Dispense: 90 tablet; Refill: 0 - furosemide (LASIX) 20 MG tablet; Take 1 tablet (20 mg total) by mouth daily.  Dispense: 90 tablet; Refill: 0 - gabapentin (NEURONTIN) 100 MG capsule; Take 1 capsule (100 mg total) by mouth 3 (three) times daily.  Dispense: 90 capsule; Refill: 0 - sertraline (ZOLOFT) 100 MG tablet; Take 1 tablet (100 mg total) by mouth daily.  Dispense: 90 tablet; Refill:  0  Lab Results  Component Value Date   NA 137 03/31/2023   K 3.8 03/31/2023   CO2 27 03/31/2023   GLUCOSE 78 03/31/2023   BUN 25 (H) 03/31/2023   CREATININE 0.77 03/31/2023   CALCIUM 8.5 (L) 03/31/2023   GFRNONAA >60 03/31/2023   One-time refill sent to mail order pharmacy. Patient and son aware they will need to establish with a PCP for further refills. Recommended he call to see if they will allow NP appt to be via video.  Follow Up Instructions: I discussed the assessment and treatment plan with the patient. The patient was provided an opportunity to ask questions and all were answered. The patient agreed with the plan and demonstrated an understanding of the instructions.  A copy of instructions were sent to the patient via MyChart unless otherwise noted below.   The patient was advised to call back or seek an in-person evaluation if the symptoms worsen or if the condition fails to  improve as anticipated.    Michele Climes, PA-C

## 2023-07-12 NOTE — Patient Instructions (Signed)
 Michele Matthews, thank you for joining [name], PA-C for today's virtual visit.  While this provider is not your primary care provider (PCP), if your PCP is located in our provider database this encounter information will be shared with them immediately following your visit.   A [facility] MyChart account gives you access to today's visit and all your visits, tests, and labs performed at [facility] " click here if you don't have a [facility] MyChart account or go to [email or web address]  Consent: (Patient) Michele Matthews provided verbal consent for this virtual visit at the beginning of the encounter.  Current Medications:  Current Outpatient Medications:    lisinopril (ZESTRIL) 10 MG tablet, Take 10 mg by mouth daily., Disp: , Rfl:    acetaminophen (TYLENOL) 500 MG tablet, Take 2 tablets (1,000 mg total) by mouth 3 (three) times daily., Disp: 30 tablet, Rfl: 0   ascorbic acid (VITAMIN C) 250 MG tablet, Take 1 tablet (250 mg total) by mouth 2 (two) times daily. (Patient not taking: Reported on 05/26/2022), Disp: , Rfl:    atorvastatin (LIPITOR) 20 MG tablet, Take 1 tablet (20 mg total) by mouth daily., Disp: 90 tablet, Rfl: 0   busPIRone (BUSPAR) 7.5 MG tablet, Take 1 tablet (7.5 mg total) by mouth 2 (two) times daily., Disp: 180 tablet, Rfl: 3   carvedilol (COREG) 6.25 MG tablet, Take 0.5 tablets (3.125 mg total) by mouth 2 (two) times daily with a meal., Disp: 90 tablet, Rfl: 0   clopidogrel (PLAVIX) 75 MG tablet, Take 75 mg by mouth daily., Disp: , Rfl:    furosemide (LASIX) 20 MG tablet, Take 1 tablet (20 mg total) by mouth daily., Disp: 90 tablet, Rfl: 0   gabapentin (NEURONTIN) 100 MG capsule, Take 1 capsule (100 mg total) by mouth 3 (three) times daily., Disp: 90 capsule, Rfl: 0   hydrOXYzine (ATARAX/VISTARIL) 10 MG tablet, Take 1 tablet (10 mg total) by mouth 2 (two) times daily as needed for anxiety (sleep)., Disp: 60 tablet, Rfl: 11   Multiple Vitamin  (MULTIVITAMIN) tablet, Take 1 tablet by mouth daily., Disp: , Rfl:    pantoprazole (PROTONIX) 20 MG tablet, Take 20 mg by mouth daily as needed for heartburn or indigestion., Disp: , Rfl:    sertraline (ZOLOFT) 100 MG tablet, Take 1 tablet (100 mg total) by mouth daily., Disp: 90 tablet, Rfl: 0   Tetrahydrozoline HCl (VISINE OP), Place 1 drop into both eyes 2 (two) times daily as needed (dry eyes)., Disp: , Rfl:    Medications ordered in this encounter:  Meds ordered this encounter  Medications   atorvastatin (LIPITOR) 20 MG tablet    Sig: Take 1 tablet (20 mg total) by mouth daily.    Dispense:  90 tablet    Refill:  0    One-time refill. Further fills to come from PCP.    Supervising Provider:   Merrilee Jansky [[id number]]   carvedilol (COREG) 6.25 MG tablet    Sig: Take 0.5 tablets (3.125 mg total) by mouth 2 (two) times daily with a meal.    Dispense:  90 tablet    Refill:  0    Supervising Provider:   Merrilee Jansky [[id number]]   furosemide (LASIX) 20 MG tablet    Sig: Take 1 tablet (20 mg total) by mouth daily.    Dispense:  90 tablet    Refill:  0    Supervising Provider:   Merrilee Jansky [id number]   gabapentin (  NEURONTIN) 100 MG capsule    Sig: Take 1 capsule (100 mg total) by mouth 3 (three) times daily.    Dispense:  90 capsule    Refill:  0    Supervising Provider:   Merrilee Jansky [[id number]]   sertraline (ZOLOFT) 100 MG tablet    Sig: Take 1 tablet (100 mg total) by mouth daily.    Dispense:  90 tablet    Refill:  0    Supervising Provider:   Merrilee Jansky [[id number]]     *If you need refills on other medications prior to your next appointment, please contact your pharmacy*  Follow-Up: Call back or seek an in-person evaluation if the symptoms worsen or if the condition fails to improve as anticipated.  [facility] [phone]  Other Instructions Refills have been sent. Remember to call new PCP office to reschedule an appointment to  establish.  Have her start daily benefiber supplement OTC to help keep stools bulked but easy to pass.    If you have been instructed to have an in-person evaluation today at a local Urgent Care facility, please use the link below. It will take you to a list of all of our available [facility], including address, phone number and hours of operation. Please do not delay care.  [facility]  If you or a family member do not have a primary care provider, use the link below to schedule a visit and establish care. When you choose a [facility] primary care physician or advanced practice provider, you gain a long-term partner in health. Find a Primary Care Provider  Learn more about [facility]'s in-office and virtual care options: [facility] - Get Care Now

## 2023-08-22 ENCOUNTER — Telehealth

## 2023-09-19 ENCOUNTER — Encounter (INDEPENDENT_AMBULATORY_CARE_PROVIDER_SITE_OTHER): Payer: Self-pay

## 2023-10-09 ENCOUNTER — Telehealth: Admitting: Family Medicine

## 2023-10-09 DIAGNOSIS — Z760 Encounter for issue of repeat prescription: Secondary | ICD-10-CM

## 2023-10-09 NOTE — Progress Notes (Signed)
 She has had refills (from our group)provided back in March and encouraged to get a PCP then. They report no PCP. Advised she will need in person for vitals, labs and monitor.  Patient acknowledged agreement and understanding of the plan.

## 2023-12-05 ENCOUNTER — Encounter: Payer: Self-pay | Admitting: Family Medicine

## 2023-12-07 ENCOUNTER — Telehealth: Admitting: Emergency Medicine

## 2023-12-07 DIAGNOSIS — Z760 Encounter for issue of repeat prescription: Secondary | ICD-10-CM

## 2023-12-07 NOTE — Progress Notes (Signed)
 Spoke with pt's husband. Explained we can't do refills through virtual urgent care - see prior notes dating back to at least March of this year. Suggested pt try an in person urgent care if they have bottles they can show the providers there.

## 2023-12-21 ENCOUNTER — Encounter: Payer: Self-pay | Admitting: Emergency Medicine

## 2024-01-03 ENCOUNTER — Telehealth

## 2024-01-03 ENCOUNTER — Telehealth: Admitting: Physician Assistant

## 2024-01-03 DIAGNOSIS — Z760 Encounter for issue of repeat prescription: Secondary | ICD-10-CM

## 2024-01-03 NOTE — Progress Notes (Signed)
 Virtual Visit Consent   Michele Matthews, you are scheduled for a virtual visit with a [facility] provider today. Just as with appointments in the office, your consent must be obtained to participate. Your consent will be active for this visit and any virtual visit you may have with one of our providers in the next 365 days. If you have a MyChart account, a copy of this consent can be sent to you electronically.  As this is a virtual visit, video technology does not allow for your provider to perform a traditional examination. This may limit your provider's ability to fully assess your condition. If your provider identifies any concerns that need to be evaluated in person or the need to arrange testing (such as labs, EKG, etc.), we will make arrangements to do so. Although advances in technology are sophisticated, we cannot ensure that it will always work on either your end or our end. If the connection with a video visit is poor, the visit may have to be switched to a telephone visit. With either a video or telephone visit, we are not always able to ensure that we have a secure connection.  By engaging in this virtual visit, you consent to the provision of healthcare and authorize for your insurance to be billed (if applicable) for the services provided during this visit. Depending on your insurance coverage, you may receive a charge related to this service.  I need to obtain your verbal consent now. Are you willing to proceed with your visit today? Michele Matthews has provided verbal consent on 01/03/2024 for a virtual visit (video or telephone). Michele Matthews, [place]  Date: 01/03/2024 12:51 PM   Virtual Visit via Video Note   I, Michele Matthews, connected with  Michele Matthews  ([id number], [id number]) on 01/03/24 at 12:45 PM EDT by a video-enabled telemedicine application and verified that I am speaking with the correct person using two identifiers.  Location: Patient: Virtual Visit Location  Patient: Home Provider: Virtual Visit Location Provider: Home Office   I discussed the limitations of evaluation and management by telemedicine and the availability of in person appointments. The patient expressed understanding and agreed to proceed.    History of Present Illness: Michele Matthews is a 78 y.o. who identifies as a female who was assigned female at birth, and is being seen today for request of medication refills. Notes she is out of some medications that we filled back at appt in March. Is scheduled to see new PCP but not until end of the month and they will not refill for her until she is established.   Has been told multiple times over past few months by multiple providers that she will need an in person evaluation as she is overdue for lab monitoring and exam for ongoing refills of medication, and has been told multiple times we can provide a max of 6-month supply of refills for her, which was done in March.  HPI: HPI  Problems:  Patient Active Problem List   Diagnosis Date Noted   Foot pain, left 03/22/2023   Hemiparesis affecting left side as late effect of cerebrovascular accident (CVA) (HCC) 05/29/2022   PAD (peripheral artery disease) (HCC) 05/26/2022   HLD (hyperlipidemia) 05/26/2022   Depression with anxiety 05/26/2022   Leg edema, right 05/26/2022   UTI (urinary tract infection) 09/05/2021   Chronic combined systolic and diastolic heart failure (HCC) 09/05/2021   Hypokalemia 11/28/2020   Hyponatremia 11/28/2020   Left  foot infection 11/25/2020   Chronic combined systolic and diastolic CHF (congestive heart failure) (HCC) 07/24/2017   Severe protein-calorie malnutrition (HCC) 04/01/2016   Anemia, iron  deficiency    Pressure ulcer 12/27/2015   Essential hypertension 12/27/2015   Anemia 10/26/2015   COPD (chronic obstructive pulmonary disease) (HCC) 06/19/2015   Coronary artery disease 06/19/2015   GERD (gastroesophageal reflux disease) 06/19/2015    Hyperlipidemia, unspecified 06/19/2015   Long term current use of anticoagulant therapy 06/19/2015   Anxiety and depression 06/19/2015   Symptomatic anemia 12/10/2014   Upper GI bleed    History of CVA with residual deficit 12/10/2013   Hemianopsia 12/10/2013   Left-sided weakness 12/10/2013   Tobacco abuse 12/10/2013    Allergies: No Known Allergies Medications:  Current Outpatient Medications:    acetaminophen  (TYLENOL ) 500 MG tablet, Take 2 tablets (1,000 mg total) by mouth 3 (three) times daily., Disp: 30 tablet, Rfl: 0   ascorbic acid  (VITAMIN C) 250 MG tablet, Take 1 tablet (250 mg total) by mouth 2 (two) times daily. (Patient not taking: Reported on 05/26/2022), Disp: , Rfl:    atorvastatin  (LIPITOR) 20 MG tablet, Take 1 tablet (20 mg total) by mouth daily., Disp: 90 tablet, Rfl: 0   busPIRone  (BUSPAR ) 7.5 MG tablet, Take 1 tablet (7.5 mg total) by mouth 2 (two) times daily., Disp: 180 tablet, Rfl: 3   carvedilol  (COREG ) 6.25 MG tablet, Take 0.5 tablets (3.125 mg total) by mouth 2 (two) times daily with a meal., Disp: 90 tablet, Rfl: 0   clopidogrel  (PLAVIX ) 75 MG tablet, Take 75 mg by mouth daily., Disp: , Rfl:    furosemide  (LASIX ) 20 MG tablet, Take 1 tablet (20 mg total) by mouth daily., Disp: 90 tablet, Rfl: 0   gabapentin  (NEURONTIN ) 100 MG capsule, Take 1 capsule (100 mg total) by mouth 3 (three) times daily., Disp: 90 capsule, Rfl: 0   hydrOXYzine  (ATARAX /VISTARIL ) 10 MG tablet, Take 1 tablet (10 mg total) by mouth 2 (two) times daily as needed for anxiety (sleep)., Disp: 60 tablet, Rfl: 11   lisinopril  (ZESTRIL ) 10 MG tablet, Take 10 mg by mouth daily., Disp: , Rfl:    Multiple Vitamin (MULTIVITAMIN) tablet, Take 1 tablet by mouth daily., Disp: , Rfl:    pantoprazole  (PROTONIX ) 20 MG tablet, Take 20 mg by mouth daily as needed for heartburn or indigestion., Disp: , Rfl:    sertraline  (ZOLOFT ) 100 MG tablet, Take 1 tablet (100 mg total) by mouth daily., Disp: 90 tablet, Rfl:  0   Tetrahydrozoline HCl (VISINE OP), Place 1 drop into both eyes 2 (two) times daily as needed (dry eyes)., Disp: , Rfl:   Observations/Objective: Patient is well-developed, well-nourished in no acute distress.  Resting comfortably  at home.  Head is normocephalic, atraumatic.  No labored breathing.  Speech is clear and coherent with logical content.  Patient is alert and oriented at baseline.    Assessment and Plan: 1. Encounter for medication refill (Primary)  Reiterated unfortunately will need in-person evaluation so if new PCP will not fill until appointment, she will need to be seen at one of our in-person urgent care locations. Resources given to patient and husband.   Follow Up Instructions: I discussed the assessment and treatment plan with the patient. The patient was provided an opportunity to ask questions and all were answered. The patient agreed with the plan and demonstrated an understanding of the instructions.  A copy of instructions were sent to the patient via MyChart unless otherwise noted  below.   The patient was advised to call back or seek an in-person evaluation if the symptoms worsen or if the condition fails to improve as anticipated.    Michele Velma Lunger, PA-C

## 2024-01-03 NOTE — Patient Instructions (Signed)
 Michele Matthews, thank you for joining [name], PA-C for today's virtual visit.  While this provider is not your primary care provider (PCP), if your PCP is located in our provider database this encounter information will be shared with them immediately following your visit.   A [facility] MyChart account gives you access to today's visit and all your visits, tests, and labs performed at [facility]  click here if you don't have a [facility] MyChart account or go to [email or web address]  Consent: (Patient) Michele Matthews provided verbal consent for this virtual visit at the beginning of the encounter.  Current Medications:  Current Outpatient Medications:    acetaminophen  (TYLENOL ) 500 MG tablet, Take 2 tablets (1,000 mg total) by mouth 3 (three) times daily., Disp: 30 tablet, Rfl: 0   ascorbic acid  (VITAMIN C) 250 MG tablet, Take 1 tablet (250 mg total) by mouth 2 (two) times daily. (Patient not taking: Reported on 05/26/2022), Disp: , Rfl:    atorvastatin  (LIPITOR) 20 MG tablet, Take 1 tablet (20 mg total) by mouth daily., Disp: 90 tablet, Rfl: 0   busPIRone  (BUSPAR ) 7.5 MG tablet, Take 1 tablet (7.5 mg total) by mouth 2 (two) times daily., Disp: 180 tablet, Rfl: 3   carvedilol  (COREG ) 6.25 MG tablet, Take 0.5 tablets (3.125 mg total) by mouth 2 (two) times daily with a meal., Disp: 90 tablet, Rfl: 0   clopidogrel  (PLAVIX ) 75 MG tablet, Take 75 mg by mouth daily., Disp: , Rfl:    furosemide  (LASIX ) 20 MG tablet, Take 1 tablet (20 mg total) by mouth daily., Disp: 90 tablet, Rfl: 0   gabapentin  (NEURONTIN ) 100 MG capsule, Take 1 capsule (100 mg total) by mouth 3 (three) times daily., Disp: 90 capsule, Rfl: 0   hydrOXYzine  (ATARAX /VISTARIL ) 10 MG tablet, Take 1 tablet (10 mg total) by mouth 2 (two) times daily as needed for anxiety (sleep)., Disp: 60 tablet, Rfl: 11   lisinopril  (ZESTRIL ) 10 MG tablet, Take 10 mg by mouth daily., Disp: , Rfl:    Multiple Vitamin  (MULTIVITAMIN) tablet, Take 1 tablet by mouth daily., Disp: , Rfl:    pantoprazole  (PROTONIX ) 20 MG tablet, Take 20 mg by mouth daily as needed for heartburn or indigestion., Disp: , Rfl:    sertraline  (ZOLOFT ) 100 MG tablet, Take 1 tablet (100 mg total) by mouth daily., Disp: 90 tablet, Rfl: 0   Tetrahydrozoline HCl (VISINE OP), Place 1 drop into both eyes 2 (two) times daily as needed (dry eyes)., Disp: , Rfl:    Medications ordered in this encounter:  No orders of the defined types were placed in this encounter.    *If you need refills on other medications prior to your next appointment, please contact your pharmacy*  Follow-Up: Call back or seek an in-person evaluation if the symptoms worsen or if the condition fails to improve as anticipated.  [facility] [phone]  Other Instructions  Please use link below to help find a location that works for you to get an in-person evaluation so you can get refills.  [facility]  If you or a family member do not have a primary care provider, use the link below to schedule a visit and establish care. When you choose a [facility] primary care physician or advanced practice provider, you gain a long-term partner in health. Find a Primary Care Provider  Learn more about [facility]'s in-office and virtual care options: [facility] - Get Care Now
